# Patient Record
Sex: Male | Born: 1955
Health system: Southern US, Community
[De-identification: ages and names within clinical notes are randomized; demographics above are authoritative.]

## PROBLEM LIST (undated history)

## (undated) DIAGNOSIS — G4733 Obstructive sleep apnea (adult) (pediatric): Secondary | ICD-10-CM

## (undated) DIAGNOSIS — K5730 Diverticulosis of large intestine without perforation or abscess without bleeding: Secondary | ICD-10-CM

## (undated) DIAGNOSIS — C649 Malignant neoplasm of unspecified kidney, except renal pelvis: Secondary | ICD-10-CM

## (undated) DIAGNOSIS — Z992 Dependence on renal dialysis: Secondary | ICD-10-CM

## (undated) DIAGNOSIS — G629 Polyneuropathy, unspecified: Secondary | ICD-10-CM

## (undated) DIAGNOSIS — K635 Polyp of colon: Secondary | ICD-10-CM

## (undated) DIAGNOSIS — N186 End stage renal disease: Secondary | ICD-10-CM

## (undated) DIAGNOSIS — E782 Mixed hyperlipidemia: Secondary | ICD-10-CM

## (undated) DIAGNOSIS — E119 Type 2 diabetes mellitus without complications: Secondary | ICD-10-CM

## (undated) DIAGNOSIS — I4891 Unspecified atrial fibrillation: Secondary | ICD-10-CM

## (undated) DIAGNOSIS — I10 Essential (primary) hypertension: Secondary | ICD-10-CM

## (undated) DIAGNOSIS — D649 Anemia, unspecified: Secondary | ICD-10-CM

## (undated) HISTORY — DX: Polyp of colon: K63.5

## (undated) HISTORY — DX: Obstructive sleep apnea (adult) (pediatric): G47.33

## (undated) HISTORY — DX: Unspecified atrial fibrillation: I48.91

## (undated) HISTORY — DX: End stage renal disease: Z99.2

## (undated) HISTORY — DX: End stage renal disease: N18.6

## (undated) HISTORY — PX: COLONOSCOPY W/ POLYPECTOMY: SHX1380

## (undated) HISTORY — DX: Mixed hyperlipidemia: E78.2

## (undated) HISTORY — DX: Malignant neoplasm of unspecified kidney, except renal pelvis: C64.9

## (undated) HISTORY — PX: OTHER SURGICAL HISTORY: SHX169

## (undated) HISTORY — DX: Diverticulosis of large intestine without perforation or abscess without bleeding: K57.30

## (undated) HISTORY — DX: Type 2 diabetes mellitus without complications: E11.9

## (undated) HISTORY — DX: Essential (primary) hypertension: I10

---

## 2000-07-29 ENCOUNTER — Encounter (HOSPITAL_COMMUNITY): Admission: RE | Admit: 2000-07-29 | Discharge: 2000-08-28 | Payer: Self-pay | Admitting: Oncology

## 2000-07-29 ENCOUNTER — Encounter: Admission: RE | Admit: 2000-07-29 | Discharge: 2000-07-29 | Payer: Self-pay | Admitting: Oncology

## 2001-01-27 ENCOUNTER — Encounter: Admission: RE | Admit: 2001-01-27 | Discharge: 2001-01-27 | Payer: Self-pay | Admitting: Oncology

## 2001-01-27 ENCOUNTER — Encounter (HOSPITAL_COMMUNITY): Admission: RE | Admit: 2001-01-27 | Discharge: 2001-02-26 | Payer: Self-pay | Admitting: Oncology

## 2001-01-30 ENCOUNTER — Encounter (HOSPITAL_COMMUNITY): Payer: Self-pay | Admitting: Oncology

## 2001-07-25 ENCOUNTER — Encounter: Admission: RE | Admit: 2001-07-25 | Discharge: 2001-10-23 | Payer: Self-pay | Admitting: Family Medicine

## 2002-02-25 ENCOUNTER — Emergency Department (HOSPITAL_COMMUNITY): Admission: EM | Admit: 2002-02-25 | Discharge: 2002-02-25 | Payer: Self-pay | Admitting: *Deleted

## 2002-02-25 ENCOUNTER — Encounter: Payer: Self-pay | Admitting: *Deleted

## 2002-12-17 ENCOUNTER — Encounter: Payer: Self-pay | Admitting: Family Medicine

## 2002-12-17 ENCOUNTER — Ambulatory Visit (HOSPITAL_COMMUNITY): Admission: RE | Admit: 2002-12-17 | Discharge: 2002-12-17 | Payer: Self-pay | Admitting: Family Medicine

## 2002-12-19 ENCOUNTER — Encounter: Admission: RE | Admit: 2002-12-19 | Discharge: 2002-12-19 | Payer: Self-pay | Admitting: Oncology

## 2002-12-19 ENCOUNTER — Encounter (HOSPITAL_COMMUNITY): Admission: RE | Admit: 2002-12-19 | Discharge: 2003-01-18 | Payer: Self-pay | Admitting: Oncology

## 2003-10-25 ENCOUNTER — Ambulatory Visit (HOSPITAL_COMMUNITY): Admission: RE | Admit: 2003-10-25 | Discharge: 2003-10-25 | Payer: Self-pay | Admitting: Family Medicine

## 2003-11-04 ENCOUNTER — Encounter: Admission: RE | Admit: 2003-11-04 | Discharge: 2003-11-22 | Payer: Self-pay | Admitting: Oncology

## 2003-11-18 ENCOUNTER — Ambulatory Visit (HOSPITAL_COMMUNITY): Admission: RE | Admit: 2003-11-18 | Discharge: 2003-11-18 | Payer: Self-pay | Admitting: Family Medicine

## 2003-12-17 ENCOUNTER — Encounter: Admission: RE | Admit: 2003-12-17 | Discharge: 2003-12-17 | Payer: Self-pay | Admitting: Oncology

## 2003-12-17 ENCOUNTER — Encounter (HOSPITAL_COMMUNITY): Admission: RE | Admit: 2003-12-17 | Discharge: 2004-01-16 | Payer: Self-pay | Admitting: Oncology

## 2003-12-23 ENCOUNTER — Encounter: Payer: Self-pay | Admitting: *Deleted

## 2003-12-23 ENCOUNTER — Inpatient Hospital Stay (HOSPITAL_COMMUNITY): Admission: EM | Admit: 2003-12-23 | Discharge: 2003-12-27 | Payer: Self-pay | Admitting: Emergency Medicine

## 2004-01-08 ENCOUNTER — Encounter (HOSPITAL_COMMUNITY): Admission: RE | Admit: 2004-01-08 | Discharge: 2004-02-07 | Payer: Self-pay | Admitting: Orthopaedic Surgery

## 2004-02-11 ENCOUNTER — Encounter (HOSPITAL_COMMUNITY): Admission: RE | Admit: 2004-02-11 | Discharge: 2004-03-12 | Payer: Self-pay | Admitting: Orthopaedic Surgery

## 2004-05-21 ENCOUNTER — Ambulatory Visit (HOSPITAL_COMMUNITY): Admission: RE | Admit: 2004-05-21 | Discharge: 2004-05-22 | Payer: Self-pay | Admitting: Orthopaedic Surgery

## 2004-12-16 ENCOUNTER — Ambulatory Visit (HOSPITAL_COMMUNITY): Payer: Self-pay | Admitting: Oncology

## 2004-12-16 ENCOUNTER — Encounter (HOSPITAL_COMMUNITY): Admission: RE | Admit: 2004-12-16 | Discharge: 2005-01-15 | Payer: Self-pay | Admitting: Oncology

## 2004-12-16 ENCOUNTER — Encounter: Admission: RE | Admit: 2004-12-16 | Discharge: 2004-12-16 | Payer: Self-pay | Admitting: Oncology

## 2005-12-13 ENCOUNTER — Ambulatory Visit (HOSPITAL_COMMUNITY): Payer: Self-pay | Admitting: Oncology

## 2005-12-13 ENCOUNTER — Encounter (HOSPITAL_COMMUNITY): Admission: RE | Admit: 2005-12-13 | Discharge: 2006-01-12 | Payer: Self-pay | Admitting: Oncology

## 2005-12-13 ENCOUNTER — Encounter: Admission: RE | Admit: 2005-12-13 | Discharge: 2005-12-13 | Payer: Self-pay | Admitting: Oncology

## 2006-01-26 ENCOUNTER — Encounter: Admission: RE | Admit: 2006-01-26 | Discharge: 2006-01-26 | Payer: Self-pay | Admitting: Urology

## 2006-03-16 ENCOUNTER — Ambulatory Visit (HOSPITAL_COMMUNITY): Admission: RE | Admit: 2006-03-16 | Discharge: 2006-03-16 | Payer: Self-pay | Admitting: Interventional Radiology

## 2006-03-29 ENCOUNTER — Encounter: Admission: RE | Admit: 2006-03-29 | Discharge: 2006-03-29 | Payer: Self-pay | Admitting: Interventional Radiology

## 2006-04-06 ENCOUNTER — Encounter (INDEPENDENT_AMBULATORY_CARE_PROVIDER_SITE_OTHER): Payer: Self-pay | Admitting: Specialist

## 2006-04-06 ENCOUNTER — Ambulatory Visit (HOSPITAL_COMMUNITY): Admission: RE | Admit: 2006-04-06 | Discharge: 2006-04-06 | Payer: Self-pay | Admitting: Interventional Radiology

## 2006-04-21 ENCOUNTER — Encounter: Admission: RE | Admit: 2006-04-21 | Discharge: 2006-04-21 | Payer: Self-pay | Admitting: Interventional Radiology

## 2006-05-11 ENCOUNTER — Ambulatory Visit (HOSPITAL_COMMUNITY): Admission: RE | Admit: 2006-05-11 | Discharge: 2006-05-12 | Payer: Self-pay | Admitting: Interventional Radiology

## 2006-07-05 ENCOUNTER — Encounter: Admission: RE | Admit: 2006-07-05 | Discharge: 2006-07-05 | Payer: Self-pay | Admitting: Interventional Radiology

## 2006-12-12 ENCOUNTER — Encounter (HOSPITAL_COMMUNITY): Admission: RE | Admit: 2006-12-12 | Discharge: 2007-01-11 | Payer: Self-pay | Admitting: Oncology

## 2006-12-12 ENCOUNTER — Ambulatory Visit (HOSPITAL_COMMUNITY): Payer: Self-pay | Admitting: Oncology

## 2006-12-27 ENCOUNTER — Encounter: Admission: RE | Admit: 2006-12-27 | Discharge: 2006-12-27 | Payer: Self-pay | Admitting: Interventional Radiology

## 2007-02-23 HISTORY — PX: ESOPHAGOGASTRODUODENOSCOPY: SHX1529

## 2007-02-23 HISTORY — PX: OTHER SURGICAL HISTORY: SHX169

## 2007-03-15 ENCOUNTER — Inpatient Hospital Stay (HOSPITAL_COMMUNITY): Admission: EM | Admit: 2007-03-15 | Discharge: 2007-03-22 | Payer: Self-pay | Admitting: Emergency Medicine

## 2007-03-16 ENCOUNTER — Ambulatory Visit: Payer: Self-pay | Admitting: Internal Medicine

## 2007-04-20 ENCOUNTER — Ambulatory Visit: Payer: Self-pay | Admitting: Internal Medicine

## 2007-04-28 ENCOUNTER — Ambulatory Visit: Payer: Self-pay | Admitting: Internal Medicine

## 2007-04-28 ENCOUNTER — Encounter: Payer: Self-pay | Admitting: Internal Medicine

## 2007-04-28 ENCOUNTER — Ambulatory Visit (HOSPITAL_COMMUNITY): Admission: RE | Admit: 2007-04-28 | Discharge: 2007-04-28 | Payer: Self-pay | Admitting: Internal Medicine

## 2007-05-01 ENCOUNTER — Encounter: Payer: Self-pay | Admitting: Pulmonary Disease

## 2007-05-29 ENCOUNTER — Ambulatory Visit: Payer: Self-pay | Admitting: Surgery

## 2007-06-08 ENCOUNTER — Ambulatory Visit (HOSPITAL_COMMUNITY): Admission: RE | Admit: 2007-06-08 | Discharge: 2007-06-08 | Payer: Self-pay | Admitting: Surgery

## 2007-06-08 ENCOUNTER — Ambulatory Visit: Payer: Self-pay | Admitting: Vascular Surgery

## 2007-06-12 ENCOUNTER — Encounter (HOSPITAL_COMMUNITY): Admission: RE | Admit: 2007-06-12 | Discharge: 2007-07-12 | Payer: Self-pay | Admitting: Oncology

## 2007-06-12 ENCOUNTER — Ambulatory Visit (HOSPITAL_COMMUNITY): Payer: Self-pay | Admitting: Oncology

## 2007-06-13 ENCOUNTER — Encounter: Payer: Self-pay | Admitting: Pulmonary Disease

## 2007-06-15 ENCOUNTER — Encounter: Payer: Self-pay | Admitting: Pulmonary Disease

## 2007-07-18 ENCOUNTER — Ambulatory Visit: Payer: Self-pay | Admitting: Vascular Surgery

## 2007-07-21 ENCOUNTER — Ambulatory Visit: Payer: Self-pay | Admitting: Surgery

## 2007-08-24 ENCOUNTER — Ambulatory Visit: Payer: Self-pay | Admitting: Pulmonary Disease

## 2007-08-24 DIAGNOSIS — E785 Hyperlipidemia, unspecified: Secondary | ICD-10-CM

## 2007-08-24 DIAGNOSIS — I10 Essential (primary) hypertension: Secondary | ICD-10-CM | POA: Insufficient documentation

## 2007-08-24 DIAGNOSIS — G4733 Obstructive sleep apnea (adult) (pediatric): Secondary | ICD-10-CM

## 2007-08-24 DIAGNOSIS — G4731 Primary central sleep apnea: Secondary | ICD-10-CM

## 2007-09-05 ENCOUNTER — Encounter: Payer: Self-pay | Admitting: Pulmonary Disease

## 2009-10-08 ENCOUNTER — Encounter: Admission: RE | Admit: 2009-10-08 | Discharge: 2009-10-08 | Payer: Self-pay | Admitting: Interventional Radiology

## 2009-10-08 ENCOUNTER — Ambulatory Visit (HOSPITAL_COMMUNITY): Admission: RE | Admit: 2009-10-08 | Discharge: 2009-10-08 | Payer: Self-pay | Admitting: Interventional Radiology

## 2009-10-16 ENCOUNTER — Encounter: Payer: Self-pay | Admitting: Cardiology

## 2010-03-14 ENCOUNTER — Encounter (HOSPITAL_COMMUNITY): Payer: Self-pay | Admitting: Oncology

## 2010-03-15 ENCOUNTER — Encounter (HOSPITAL_COMMUNITY): Payer: Self-pay | Admitting: Oncology

## 2010-03-15 ENCOUNTER — Encounter: Payer: Self-pay | Admitting: Interventional Radiology

## 2010-03-16 ENCOUNTER — Encounter: Payer: Self-pay | Admitting: Interventional Radiology

## 2010-04-16 ENCOUNTER — Encounter (INDEPENDENT_AMBULATORY_CARE_PROVIDER_SITE_OTHER): Payer: Self-pay | Admitting: *Deleted

## 2010-04-21 NOTE — Letter (Signed)
Summary: Recall, Screening Colonoscopy Only  [facility]  [address]   [place], [place] [address]   Phone: [phone]  Fax: [phone]    April 16, 2010  Albert [name] [address] [place], [place]  [address] 12-Mar-1955   Dear Albert [name],   [facility] records indicate it is time to schedule your colonoscopy.   Please call our office at [phone] and ask for the nurse.   Thank you, Burnadette Peter, LPN Waldon Merl, [facility] Ph: [phone]   Fax: [phone]

## 2010-04-22 ENCOUNTER — Telehealth (INDEPENDENT_AMBULATORY_CARE_PROVIDER_SITE_OTHER): Payer: Self-pay | Admitting: *Deleted

## 2010-04-24 ENCOUNTER — Encounter: Payer: Self-pay | Admitting: Internal Medicine

## 2010-04-30 ENCOUNTER — Encounter: Payer: Self-pay | Admitting: Internal Medicine

## 2010-04-30 NOTE — Progress Notes (Signed)
  Phone Note Call from Patient   Caller: Patient Call For: schedule colonoscopy Reason for Call: Talk to Nurse Action Taken: Phone Call Completed Details for Reason: letter sent to pt to schedule colonoscopy, [facility] Summary of Call: this nurse returned pt call and completed screening form. form then forwarded to scheduling personel.  Initial call taken by: Loney Loh

## 2010-05-04 ENCOUNTER — Encounter: Payer: Medicare Other | Admitting: Internal Medicine

## 2010-05-04 ENCOUNTER — Other Ambulatory Visit: Payer: Self-pay | Admitting: Internal Medicine

## 2010-05-04 ENCOUNTER — Ambulatory Visit (HOSPITAL_COMMUNITY)
Admission: RE | Admit: 2010-05-04 | Discharge: 2010-05-04 | Disposition: A | Discharge status: Home / Self Care | Payer: Medicare Other | Source: Ambulatory Visit | Attending: Internal Medicine | Admitting: Internal Medicine

## 2010-05-04 DIAGNOSIS — K5730 Diverticulosis of large intestine without perforation or abscess without bleeding: Secondary | ICD-10-CM

## 2010-05-04 DIAGNOSIS — Z09 Encounter for follow-up examination after completed treatment for conditions other than malignant neoplasm: Secondary | ICD-10-CM | POA: Insufficient documentation

## 2010-05-04 DIAGNOSIS — Z860100 Personal history of colon polyps, unspecified: Secondary | ICD-10-CM | POA: Insufficient documentation

## 2010-05-04 DIAGNOSIS — E785 Hyperlipidemia, unspecified: Secondary | ICD-10-CM | POA: Insufficient documentation

## 2010-05-04 DIAGNOSIS — E119 Type 2 diabetes mellitus without complications: Secondary | ICD-10-CM | POA: Insufficient documentation

## 2010-05-04 DIAGNOSIS — I10 Essential (primary) hypertension: Secondary | ICD-10-CM | POA: Insufficient documentation

## 2010-05-04 DIAGNOSIS — D126 Benign neoplasm of colon, unspecified: Secondary | ICD-10-CM | POA: Insufficient documentation

## 2010-05-04 LAB — GLUCOSE, CAPILLARY: Glucose-Capillary: 81 mg/dL (ref 70–99)

## 2010-05-05 NOTE — Letter (Signed)
Summary: TRIAGE ORDER  TRIAGE ORDER   Imported By: Sofie Rower 04/24/2010 16:42:24  _____________________________________________________________________  External Attachment:    Type:   Image     Comment:   External Document  Appended Document: TRIAGE ORDER need reason for coumadin before ok  Appended Document: [place] and line busy.  Appended Document: TRIAGE ORDER Called pt, Vm full.   Appended Document: TRIAGE ORDER Pt said he is on coumadin for A-Fib. Cardiologist is Dr. Claiborne Billings here in [place] at [place].   Appended Document: TRIAGE ORDER noted; stop coumadin x 4 days prior to procedure  Appended Document: TRIAGE ORDER Per pt he has not taken his Coumadin for today.  I instructed pt to hold his Coumadin starting today until after his procedure on 05/04/10.Marland KitchenMarland Kitchen

## 2010-05-05 NOTE — Letter (Signed)
Summary: [facility] INSTRCTIONS  [facility] INSTRCTIONS   Imported By: Sofie Rower 04/30/2010 12:41:37  _____________________________________________________________________  External Attachment:    Type:   Image     Comment:   External Document

## 2010-05-07 ENCOUNTER — Encounter: Payer: Self-pay | Admitting: Internal Medicine

## 2010-05-10 NOTE — Op Note (Signed)
  NAMEHAMZA, Albert [name]           ACCOUNT NO.:  [id number]  MEDICAL RECORD NO.:  [id number]           PATIENT TYPE:  O  LOCATION:  DAYP                          FACILITY:  [facility]  PHYSICIAN:  R. Garfield Cornea, M.D. DATE OF BIRTH:  01/04/1956  DATE OF PROCEDURE:  05/04/2010 DATE OF DISCHARGE:                              OPERATIVE REPORT   PROCEDURE:  Surveillance colonoscopy.  INDICATIONS FOR PROCEDURE:  A 55 year old gentleman had a large adenoma removed.  Scope 3 years ago.  He is here for surveillance.  He is not having any lower GI tract symptoms.  Risks, benefits, limitations, alternatives, and approach have been reviewed, questions answered.  He has stopped his Coumadin 4 days ago on request.  Please see the documentation medical record.  PROCEDURE NOTE:  O2 saturation, blood pressure, pulse, and respirations were monitored throughout the entirety of procedure.  CONSCIOUS SEDATION:  Versed 4 mg IV and Demerol 75 mg IV in divided doses.  INSTRUMENT:  Pentax video chip system.  FINDINGS:  Digital rectal exam revealed no abnormalities.  Endoscopic findings:  Prep was adequate.  Colon:  Colonic mucosa was surveyed from the rectosigmoid junction through the left transverse right colon to the appendiceal orifice, ileocecal valve/cecum.  These structures were well seen and photographed for the record.  From this level, scope was slowly and cautiously withdrawn.  All previously mentioned mucosal surfaces were again seen.  The patient again had scattered pancolonic diverticula, there was a single diminutive polyp in distal transverse colon which was cold biopsied/removed.  Remaining colonic mucosa appeared normal.  Scope was pulled down the rectum where thorough examination of the rectal mucosa including retroflex view of anal verge demonstrated no abnormalities.  The patient tolerated the procedure well and was reactive in Endoscopy.  Cecal withdrawal time 16  minutes.  IMPRESSION: 1. Normal rectum. 2. Few scattered pancolonic diverticula, single diminutive polyp,     distal transverse colon, status post cold biopsy removal.  RECOMMENDATIONS: 1. Diverticulosis and polyp literature provided to Mr. Spadoni. 2. Follow up on path. 3. Resume Coumadin today. 4. Further recommendations to follow.     Bridgette Habermann, M.D.     RMR/MEDQ  D:  05/04/2010  T:  05/04/2010  Job:  [id number]  cc:   [facility]  Electronically Signed by Jannette Spanner M.D. on 05/10/2010 09:12:23 AM

## 2010-05-12 NOTE — Letter (Addendum)
Summary: Patient Notice, Colon Biopsy Results  [facility]  [address]   [place], [place] [address]   Phone: [phone]  Fax: [phone]       May 07, 2010   Albert [name] [address] [place], [place]  [address] 1955/03/03    Dear Mr. [name],  I am pleased to inform you that the biopsies taken during your recent colonoscopy did not show any evidence of cancer upon pathologic examination.  Additional information/recommendations:  No further action is needed at this time.  Please follow-up with your primary care physician for your other healthcare needs.  You should have a repeat colonoscopy examination  in 5 years.  Please call us if you are having persistent problems or have questions about your condition that have not been fully answered at this time.  Sincerely,    R. Garfield Cornea MD, [facility] Ph: [phone]    Fax: [phone]   Appended Document: Patient Notice, Colon Biopsy Results letter mailed to pt  Appended Document: Patient Notice, Colon Biopsy Results reminder in epic

## 2010-07-07 NOTE — Discharge Summary (Signed)
Albert [name], Albert [name] NO.:  [id number]   MEDICAL RECORD NO.:  [id number]          PATIENT TYPE:  INP   LOCATION:  A207                          FACILITY:  [facility]   PHYSICIAN:  Bonnielee Haff, MD     DATE OF BIRTH:  04-27-1955   DATE OF ADMISSION:  03/15/2007  DATE OF DISCHARGE:  LH                               DISCHARGE SUMMARY   This is an addendum to a discharge summary dictated on March 16, 2007.   DISCHARGE DIAGNOSES:  See previously dictated summary as well.   1. Mild GI bleed.  2. Paroxysmal atrial fibrillation.  3. Chronic renal insufficiency.  4. Congestive heart failure.   Essentially for the last six days, the patient has been awaiting  disposition per Dr. Mathis Bud.  Patient, because of his worsening  systolic EF, was considered to undergo a cardiac cath.  Today Dr.  Mathis Bud told me that the patient has decided not to undergo it, so she  is recommending outpatient followup.  So we will be discharging this  patient home.  Patient does have a history of A fib, for which he will  be started back on Lovenox and Coumadin.  He also has a history of renal  failure, for which Dr. Lowanda Foster was following him.  He will follow him  as an outpatient.  He will be continued on his Lasix.  He was set up for  a colonoscopy as an outpatient; however, because of he was still in the  hospital, that obviously was cancelled.  That will need to be done at  some point in the future.  We will make an appointment for him to see  Dr. [name] in a month's time.  His hemoglobin has remained stable, and  today it is 13.8.  His hemoccult has been negative, so he is not  actively bleeding at this time.  Dr. Mathis Bud feels that patient needs  to be on Lovenox and Coumadin until his INR is therapeutic.  His INR was  2 yesterday, but he has been off Coumadin for a few days now.   PHYSICAL EXAMINATION:  Today, the patient is feeling well.  He does not  have any complaints to offer.   His vital signs are all stable.  He is  saturating at 99% on room air.  He has been ambulating with no  difficulties.  LUNGS:  Clear to auscultation.  EXTREMITIES:  Do not show any edema.   His labs are all pretty stable at this time.  His renal function is  stable with a BUN of 52 and creatinine of 3.4.  Overall, he is  considered stable to be discharged home.   DISCHARGE MEDICATIONS:  1. Coumadin 2.5 mg p.o. daily.  2. Lovenox 110 mg subcu daily, once daily x5 days.  PT/INR to be      checked on Friday, January 30th.  The results to be called to Dr.      Mathis Bud.  3. Lasix 60 mg p.o. daily.  4. Protonix 40 mg p.o. daily.  5. Coreg 6.25 mg p.o. b.i.d.  6. Allopurinol 200 mg p.o.  daily.  7. Cozaar 50 mg p.o. daily.  8. Diltiazem CD 180 mg once daily.  9. Fish oil 3 times a day.  10.Glipizide 10 mg once daily.  11.Tricor 145 mg daily.   FOLLOW UP:  1. Dr. Sydell Axon in one month.  2. Dr. Mathis Bud on February 2.  3. PT/INR on January 30th.  4. Dr. Lowanda Foster in two weeks.   DIET:  He will continue to have a renal diet.   PHYSICAL ACTIVITY:  No restrictions.   He has been told the importance of following up with all of the  physicians mentioned above.   Imaging studies that he underwent here include acute abdominal series,  which did not show any specific findings.   Chest x-ray showed possible minimal right pleural effusion with stable  right basilar atelectasis.   It should be noted that the patient also has a history of recurrent  renal carcinoma, for which he gets outpatient followup.      Bonnielee Haff, MD  Electronically Signed     GK/MEDQ  D:  03/22/2007  T:  03/22/2007  Job:  [id number]   cc:   Leslye Peer, MD  Fax: [phone]   Alison Murray, M.D.  Fax: [phone]   R. Garfield Cornea, M.D.  [address]  [place]  [place] [address]

## 2010-07-07 NOTE — Group Therapy Note (Signed)
NAMEGUSSIE, ODOMS NO.:  [id number]   MEDICAL RECORD NO.:  [id number]          PATIENT TYPE:  INP   LOCATION:  A207                          FACILITY:  [facility]   PHYSICIAN:  Anselmo Pickler, DO    DATE OF BIRTH:  1955-02-23   DATE OF PROCEDURE:  03/21/2007  DATE OF DISCHARGE:                                 PROGRESS NOTE   The patient seen today, resting comfortably in bed.  He was transferred  out of the ICU yesterday.  He denies any current complaints.  He was  seen by Dr. Lowanda Foster, who will continue with his current treatment as  well, and Dr. Mathis Bud talked to him regarding possible cath and he  talked with the family.  Dr. Mathis Bud gave several recommendations.  We  are planning taking him to [facility] for catheterization.  She changed  his Diltiazem to 180.  They restarted the Coreg at 3.125, discontinued  his Coumadin and put him on a heparin drip for AFib.  Also will guaiac  all stools, and will continue to work with Dr. Lowanda Foster to maximize his  kidney function to have this done.   PHYSICAL EXAMINATION:  VITALS:  His vitals for today are as follows,  temperature 97.9, pulse 91, respirations 20, blood pressure 130/71.  GENERAL:  Generally this is a 55 year old African American male who is  well-developed, well-nourished, in no acute distress.  HEART:  Regular rate and rhythm.  LUNGS:  Clear to auscultation bilaterally.  ABDOMEN:  Distended, soft, nontender, no organomegaly noted.  EXTREMITIES:  Positive pulses with +1 pitting edema.  The patient has  TED hose on.   LABORATORY DATA:  His labs for today include a white count of 3.6,  hemoglobin of 14.1.  Hematocrit is 41.7.  Platelet count is 199.  His  INR is 2.0.  His chemistry, sodium is 135, potassium is 3.4, chloride is  96, CO2 is 29, glucose is 99, BUN is 54, and creatinine is 3.38.   ASSESSMENT:  Mr. Paille is a 55 year old African American male who  presented to the [facility] after  having his INR drawn.  It was found to  be supratherapeutic.  Also he had also complained about an increasing  shortness of breath while he was here.  The patient also presented with  an hemoccult stool.  While he was here he was evaluated by Dr. [name] and  Dr. Mathis Bud was consulted on the case because of his increasing  shortness of breath.  Repeat two-dimensional echocardiogram was done and  the patient was found to have a significant changes in his ejection  fraction.  He was normal per Dr. Mathis Bud about a year ago and now today  his ejection fraction is 20 to 25%.  She has an extensive history with  this patient and has talked to him in the past about catheterization and  at this point in time she found that it would be in his best interest to  go ahead and have that done now.  One of the issues that has been  keeping the patient from being discharged to  [facility] is that he has 1  kidney.  He had renal cell carcinoma with a right nephrectomy in May  2000 and now his left kidney had a recurrence in March of 2008 and he  has renal insufficiency.  So he has had problems with his renal function  since he has been admitted.  The plan, from what I understand is that  Dr. Mathis Bud will work with Dr. Lowanda Foster until his kidneys are a point  where they feel that he can tolerate the stresses of being catheterized.  At that point in time Dr. Mathis Bud will then take him to [facility] to  do so.   ASSESSMENT:  1. Congestive heart failure.  Currently the patient is being treated      with diuretics and his medications have been changed.  He has been      placed  back on digoxin as noted above.  2. Bradycardia.  He had several episodes in the intensive care unit,      but the was started on CPAP and this seems to be correcting this.      Will keep on CPAP if and when he is transferred.  3. Renal insufficiency.  He is being followed by Dr. Lowanda Foster.  They      are working, as I said before, with  cardiology to get him to the      point that he can be catheterized.      Anselmo Pickler, DO  Electronically Signed     CB/MEDQ  D:  03/21/2007  T:  03/21/2007  Job:  [id number]

## 2010-07-07 NOTE — Assessment & Plan Note (Signed)
[name], Albert [name]              CHART#:  [id number]   DATE:  04/20/2007                       DOB:  30-Oct-1955   ADDENDUM:  After seeing [name] and dictating my H&P and  reviewing the discharge summary from his recent hospitalization, I  received an addended discharge summary dictation, which indicated Dr.  Mathis Bud felt it important that [name] be bridged with Lovenox.  I called Dr. Mathis Bud and discussed [name] with her.  She  recommends bridging with Lovenox for the procedure, and gave me the  green light to proceed with endoscopic evaluation at this time.  We will  therefore arrange for [name] to be bridged with Lovenox as  appropriate before and after the procedure.       Bridgette Habermann, M.D.  Electronically Signed     RMR/MEDQ  D:  04/20/2007  T:  04/20/2007  Job:  [id number]   cc:   Leslye Peer, MD  [name], M.D.
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
## 2010-07-07 NOTE — Assessment & Plan Note (Signed)
OFFICE VISIT   HIROTO, KEMNA  DOB:  08-05-1955                                       05/29/2007  [id number]   REASON FOR VISIT:  Evaluate for dialysis access.   HISTORY:  This is a 55 year old gentleman with type 2 diabetes and  hypertension with chronic renal insufficiency not yet requiring  dialysis.  He is scheduled to undergo cardiac catheterization, and there  is concern with the use of IV dye, he may require dialysis, given his  borderline kidney function.  For this reason, I have been asked to  perform permanent access.  Patient is right-handed.   REVIEW OF SYSTEMS:  GENERAL:  No fever, no chills.  CARDIAC:  Positive for atrial fibrillation.  PULMONARY:  Negative.  GI:  Negative.  GU:  As above.  VASCULAR:  Negative.  NEURO:  Negative.  ORTHO/SKIN:  Positive for gout.  PSYCH:  Negative.  ENT:  Negative.  HEME:  Negative.   PAST MEDICAL HISTORY:  1. Type 2 diabetes, beginning at age 1, not on insulin.  2. Hypertension.  3. Atrial fibrillation.  4. Gout.   FAMILY HISTORY:  Noncontributory.   SOCIAL HISTORY:  He is single with one child.  He works as a  Architectural technologist.  He does not smoke.  He has a history of  smoking, quit in 2000. He drinks approximately two beers per week.   MEDICATIONS:  1. Allopurinol 200 mg once daily.  2. Coreg 25 mg twice daily.  3. Cozaar 50 mg once daily.  4. Diltiazem 240 daily.  5. Fish oil 1000 mcg t.i.d.  6. Glipizide 10 mg per day.  7. Tricor 145 mg per day.  8. Coumadin 2.5 mg on Tuesdays and Thursdays, 5 mg all other days.   ALLERGIES:  None.   PHYSICAL EXAMINATION:  Vital signs:  Heart rate is 80.  Blood pressure  117/78.  Generally, he is well-appearing in no acute distress.  Cardiovascular:  Irregular.  Lungs:  Clear.  Palpable radial and  brachial pulse.   DIAGNOSTIC STUDIES:  Patient had vein mapping today, which shows  excellent vein above the arm crease.   ASSESSMENT/PLAN:  Chronic kidney disease, not on dialysis, needing long-  term access.  I discussed with the patient performing a left upper arm  fistula.  We discussed the possibility of nonmaturity, the risk of  steal.  Patient understands these, and he is ready to proceed.  I am  going to have him stop his Coumadin on Saturday, April 11th, and plan  for his operation on the 16th.  This will be a left upper arm fistula.   Eldridge Abrahams, MD  Electronically Signed   VWB/MEDQ  D:  05/29/2007  T:  05/30/2007  Job:  [id number]   cc:   Alison Murray, M.D.

## 2010-07-07 NOTE — H&P (Signed)
Albert [name], Albert [name]           ACCOUNT NO.:  [id number]   MEDICAL RECORD NO.:  [id number]          PATIENT TYPE:  INP   LOCATION:  A222                          FACILITY:  [facility]   PHYSICIAN:  Anselmo Pickler, DO    DATE OF BIRTH:  1955-09-25   DATE OF ADMISSION:  03/15/2007  DATE OF DISCHARGE:  LH                              HISTORY & PHYSICAL   PRIMARY CARE PHYSICIAN:  [name], M.D.   Patient is a 55 year old male who presented to the emergency room with  an abnormal lab that was found in Dr. Glo Herring office.  He said that  his INR was elevated, but originally, patient states that he went to see  Dr. Orson Ape because he is monitoring him on his Coumadin.  He was  stating that he has been having increasing shortness of breath over the  last several months and had noticed that it was getting worse.  He was  going to have a sleep study done, and when they did lab work, he was  told to follow up with his primary care physician.  At that point in  time, he was told to be examined by the emergency room.  Patient states  that he originally came in for the shortness of breath and found that he  had a decreased hemoglobin.   PAST MEDICAL HISTORY:  1. Hypertension.  2. Diabetes.  3. Atrial fib.  4. Gout.  5. Kidney cancer.  6. Renal insufficiency.   He has a family history of hypertension.  He has had a nephrectomy in  the past and has had left arm surgery due to trauma.   The patient admits to being a nonsmoker, quit in 2000, but said that he  was a pack-a-day smoker for 20 years prior to that.  He denies any drug  abuse.  He drinks socially.  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   He has no drug allergies to any medications.   CURRENT MEDICATIONS:  1. Allopurinol 200 mg p.o. daily.  2. Coreg 6.25 mg x2 daily.  3. Cozaar 50 mg p.o. daily.  4. Diltiazem/HCL 120 mg p.o. daily.  5. Glipizide 10 mg  p.o. daily.  6. Tricor 145 mg p.o. daily.  7. Warfarin 5 mg p.o. daily.   REVIEW OF SYSTEMS:  Patient is positive for weakness and fatigue.  He  denies any change in his eyes or vision.  Patient denies any changes in  his ears, hearing loss, hoarseness, otorrhea, rhinorrhea, sinus  pressure, or sore throat.  CARDIOVASCULAR:  He denies any chest pain,  palpitations, or bradycardia.  RESPIRATORY:  He is positive for dyspnea.  He denies any cough, wheezing, or tachypnea.  GASTROINTESTINAL:  Denies  any nausea, vomiting, or diarrhea.  GU:  He denies any dysuria, urgency,  or dribbling.  MUSCULOSKELETAL:  Negative for arthralgias, arthritis,  back pain.  SKIN:  Negative for pruritus, rash, or abrasions.  NEURO:  Negative for headache, confusion, or altered mental status.  METABOLIC:  Negative for  excessive thirst and cold intolerance.  HEMATOLOGIC:  He  just found out that he is anemic.   PHYSICAL EXAMINATION:  VITAL SIGNS:  Blood pressure 107/90, pulse rate  89, respirations 20.  He is satting at 100% on room air.  GENERAL:  This is a well-developed and well-nourished African-American  male who is in no acute distress.  Head is normocephalic and atraumatic.  Eyes are EOMI, PERRLA.  Ears:  Turbinates are visualized bilaterally.  Gross auditory acuity intact.  Nose: Turbinates are moist.  Throat:  Patient has positive upper plate  of dentures.  No erythema or exudate noted.  NECK:  Supple.  No lymphadenopathy.  Thyroid is within normal limits.  LUNGS:  Clear to auscultation bilaterally.  HEART:  Regular rate and rhythm.  No murmur, rub or gallop.  ABDOMEN:  Round, soft, nontender, nondistended.  No masses or  hepatosplenomegaly.  EXTREMITIES:  Full range of motion.  No tenderness, edema, or abnormal  appearance.  NEURO:  Cranial nerves II-XII grossly intact.  Motor intact in all  extremities.  SKIN:  Good turgor, good texture.   In the ED, the ED physician did do a rectal exam, which  was reported to  me as being positive.   Patient had an EKG done which shows that he is in atrial fib.   A portable chest x-ray was done while he was in the ED that showed  increasing right atelectasis; however, I will go ahead and repeat this  chest x-ray with a two view to get a better picture due to persistent  shortness of breath.   His white count is within normal limits.  Platelet count is 185,  hemoglobin is 12.7, hematocrit 38.8.  INR is 4.8.  His sodium is 140,  potassium 3.8, chloride 110, carbon dioxide 24, glucose 212, BUN 64,  creatinine 3.96.   ASSESSMENT/PLAN:  1. Persistent shortness of breath:  Will go ahead and repeat the chest      x-ray.  Get a two view.  Also, will have Dr. Mathis Bud consult and      participate regarding his cardiomegaly that was seen on the one      view.  The patient is also on Coreg and wondering if this is part      of a possible congestive heart failure picture.  Will await to see      their further recommendations but would like to get an echo ordered      on the patient as well.  2. Gastrointestinal bleed:  Will go ahead and do serial H&H's to see      if his hemoglobin and hematocrit change while he is here.  This      also could be due to his supratherapeutic INR with the heme      positive rectal exam, but will go ahead and continue to monitor      that.  GI has been consulted on the case.  3. His supratherapeutic INR:  Will hold all anticoagulants at this      point in time until he is therapeutic.  4. Renal insufficiency:  Will contact the patient to see who he uses      as his renal physician and then determine if we can just hydrate      him cautiously and see if his creatinine and BUN come down.  We      will continue to monitor the patient and change therapy as needed.  Anselmo Pickler, DO  Electronically Signed     CB/MEDQ  D:  03/16/2007  T:  03/16/2007  Job:  [id number]

## 2010-07-07 NOTE — Group Therapy Note (Signed)
Albert [name], Albert [name] NO.:  [id number]   MEDICAL RECORD NO.:  [id number]          PATIENT TYPE:  INP   LOCATION:  IC07                          FACILITY:  [facility]   PHYSICIAN:  Anselmo Pickler, DO    DATE OF BIRTH:  [birth year]   DATE OF PROCEDURE:  03/17/2007  DATE OF DISCHARGE:                                 PROGRESS NOTE   Patient seen today.  He is currently in the ICU.  I spoke with Dr.  Mathis Bud today, and discussed with her her current plan.  Based on what  she had found with patient's echocardiogram, she is concerned currently  that his function has changed in over 12 months.  He currently has an EF  of about 20-25%.  She is also thinking that she would like to possibly  do a cardiac cath on Monday with him.   Also, his renal function has continued to deteriorate.  Dr. Lowanda Foster is  on the case with him as well.  Discussed with the patient the  possibility of dialysis.  He said this has been brought up to him  before.   Currently will continue to monitor him.  The plan is to see how his  kidneys improve and then make a plan for possible cath on Monday if that  is still appropriate for him.   PHYSICAL EXAMINATION:  His heart rate is 98.  Blood pressure is 112/84.  Respirations are 26.  Pulse oxing at 97% on 2 liters.  GENERAL:  This is a 55 year old African-American male who is well-  developed and well-nourished in no acute distress.  Patient is pleasant  and answers questions appropriately.  HEART:  Regular rate and rhythm.  S1 and S2.  LUNGS:  Clear to auscultation bilaterally.  ABDOMEN:  Soft, round, nontender, nondistended.  No organomegaly noted.  EXTREMITIES:  Positive pulses.  No edema or ecchymosis noted; however,  patient does have TED hose on.   Current labs are as follows:  His white count is within normal limits  except for a decrease in his hemoglobin at 12.8.  There are no other  labs.   ASSESSMENT:  1. Congestive heart failure.  2.  Atrial fibrillation.  3. Renal failure.   PLAN:  Patient is now in the ICU.  I will continue to monitor and keep  therapies the same.  Will get daily labs and check his INR tomorrow to  see if anything needs to be adjusted.  Will continue to monitor and  change as necessary.      Anselmo Pickler, DO  Electronically Signed     CB/MEDQ  D:  03/17/2007  T:  03/17/2007  Job:  [id number]

## 2010-07-07 NOTE — Op Note (Signed)
NAME:  Albert [name], Albert [name]           ACCOUNT NO.:  [id number]   MEDICAL RECORD NO.:  [id number]          PATIENT TYPE:  AMB   LOCATION:  DAY                           FACILITY:  [facility]   PHYSICIAN:  R. Garfield Cornea, M.D. DATE OF BIRTH:  [birth year]   DATE OF PROCEDURE:  04/28/2007  DATE OF DISCHARGE:                               OPERATIVE REPORT   PROCEDURE PERFORMED:  Esophagogastroduodenoscopy with biopsy followed by  colonoscopy with snare polypectomy.   INDICATIONS FOR PROCEDURE:  55 year old gentleman with multiple medical  problems recently found to be anemic and Hemoccult positive, has had  vague upper abdominal discomfort and reflux symptoms (the latter which  had been totally abolished with a course of Protonix).  He currently has  no GI symptoms whatsoever. We saw him recently in a coagulopathic state  with an INR of 4.7, although at that time he had clinically no gross GI  bleeding, but was hemoccult positive.  He has never had a colonoscopy.  There is no family history of colorectal neoplasia.  EGD and colonoscopy  is now being done.  The patient's Coumadin was stopped four days ago  after a consultation with Dr. Mathis Bud. He has been bridged with  Lovenox, his last dose was yesterday.  This approach, again, was  discussed with the patient including the risks, benefits, alternatives,  and limitations.  His questions were answered and he is agreeable.   PROCEDURE NOTE:  O2 saturation, blood pressure, pulse rate, and  respirations were monitored throughout the entire procedure.  Conscious  sedation with Versed 3 mg IV and Demerol 75 mg IV in divided doses.  Instrument Pentax video chip system.  Cetacaine spray for topical  pharyngeal anesthesia.   ESOPHAGOGASTRODUODENOSCOPY FINDINGS:  Examination of the tubular  esophagus revealed accentuated undulating Z-line and a couple of 3-4 mm  islands of salmon colored epithelium just above the Z-line with a small  amount of  intervening normal pearly gray appearing esophageal mucosa.  Please see photos.  Otherwise, the esophageal mucosa was unremarkable.  The scope easily traversed the EG junction.  The gastric cavity was  empty and insufflated well with air.  Examination of the gastric mucosa  including retroflexion of the proximal stomach esophagogastric junction  demonstrated only a small hiatal hernia.  The pylorus was patent and  easily traversed.  Examination of the bulb and second portion revealed  no abnormalities.   THERAPEUTIC/DIAGNOSTIC MANEUVERS PERFORMED:  A couple of biopsies were  taken of the islands of salmon colored epithelium distal esophagus.  The patient tolerated the procedure well and was prepared for  colonoscopy.   COLONOSCOPIC FINDINGS:  Digital rectal exam revealed no abnormalities.  The prep was good.  The colonic mucosa was surveyed from the  rectosigmoid junction through the left, transverse, right colon, to the  area of the appendiceal orifice, ileocecal valve, and cecum.  These  structures were well seen and photographed for the record.  From this  level, the scope was slowly withdrawn.  All previously mentioned mucosal  surfaces were again seen. The patient had the following abnormalities.   1. Scattered pancolonic diverticula.  2. A 1.25 cm adenomatous appearing polyp just distal to the ileocecal      valve.  Please see photos.  This polyp was removed totally with one      pass with the hot snare cautery and recovered through the scope.      There was no bleeding.  Given ongoing anticoagulation, I elected to      go ahead and place a resolution clip directly on the polypectomy      site.  This was done without difficulty.  Again, there was no      bleeding.  The remainder of the colonic mucosa appeared normal.   The scope was pulled down in the rectum where a thorough examination of  the rectal mucosa including retroflexion in the anal verge demonstrated  no  abnormalities.  The patient tolerated the procedure well and was  reacted in endoscopy.   IMPRESSION:  Esophagogastroduodenoscopy:  Accentuated undulating Z-line  not clinically significant, a couple of islands of salmon colored  epithelium in the distal esophagus suspicious for Barrett's, this would  be somewhat unusual in an African American, but nonetheless, it was  there and biopsies were taken. Otherwise, unremarkable esophagus. Small  hiatal hernia. Otherwise, normal stomach, D1 and D2.   Colonoscopy findings:  Normal rectum, pancolonic diverticula,  pedunculated polyp just distal to the ileocecal valve removed with hot  snare technique and recovered, prophylactic resolution clip applied.  Otherwise, normal colon.   The patient could have easily been hemoccult positive from this polyp in  the setting of a coagulopathic state.  Currently, again, Albert [name]  has no GI symptoms.   RECOMMENDATIONS:  1. Continue Protonix 40 mg orally daily.  2. Follow up on path.  3. Diverticulosis literature provided to Albert [name].  4. I have asked him to resume his Coumadin today.  However, I asked      him not to start bridging back until tomorrow so he is not to      resume Lovenox until tomorrow, April 29, 2007. Further      recommendations to follow.  5. No MRI until hemostasis clip no longer present.      Bridgette Habermann, M.D.  Electronically Signed     RMR/MEDQ  D:  04/28/2007  T:  04/28/2007  Job:  [id number]   cc:   Leslye Peer, MD  Fax: [phone]   [name], M.D.  Fax: [phone]   Gaston Islam. Tressie Stalker, MD  Fax: [phone]

## 2010-07-07 NOTE — Group Therapy Note (Signed)
Albert [name], Albert [name]           ACCOUNT NO.:  [id number]   MEDICAL RECORD NO.:  [id number]          PATIENT TYPE:  INP   LOCATION:  IC07                          FACILITY:  [facility]   PHYSICIAN:  Anselmo Pickler, DO    DATE OF BIRTH:  [birth year]   DATE OF PROCEDURE:  DATE OF DISCHARGE:                                 PROGRESS NOTE   The patient was seen today with CPAP on.  He is comfortable with it, did  not have any episodes of bradycardia.  He is also having very good  output.  Still waiting for Dr. Martie Round recommendations on what to do.   PHYSICAL EXAMINATION:  VITAL SIGNS:  His vitals are as follows:  Heart  rate 100, blood pressure 135/97, respirations 19, and he is pulse oxing  at 100%.  GENERAL:  This is a 55 year old African-American male who is well-  developed, well-nourished in no acute distress.  HEART:  Regular rate and rhythm.  LUNGS:  Clear to auscultation bilaterally.  ABDOMEN:  Distended, soft, nontender.  No organomegaly noted.  EXTREMITIES:  Positive pulses with +1 pitting edema.   LABORATORY DATA:  His white count is 4, hemoglobin is 15.6, hematocrit  is 42, platelet count is 199, and his INR is 1.6.  His chemistries:  Sodium 136, potassium 3.8, chloride 98, CO2 28, glucose 80, BUN 58, and  creatinine 3.68.   ASSESSMENT/PLAN:  1. Congestive heart failure.  The patient is currently being treated      with diuretics.  Will go ahead and await any further      recommendations from Dr. Mathis Bud.  If atrial fibrillation is under      control, he can go on Coumadin therapy; however, he is still      subtherapeutic.  The pharmacy is dosing that.  2. Bradycardia.  He has not had any episodes since starting CPAP, as      his bradycardia occurred at night.  Will continue with CPAP.  3. Renal insufficiency failure.  Is being followed by Dr. Lowanda Foster and      seems stable at this point in time and still just waiting to see      what Dr. Mathis Bud would like to do  regarding his care.      Anselmo Pickler, DO  Electronically Signed     CB/MEDQ  D:  03/21/2007  T:  03/21/2007  Job:  [id number]

## 2010-07-07 NOTE — H&P (Signed)
NAME:  Albert [name], Albert [name]           ACCOUNT NO.:  [id number]   MEDICAL RECORD NO.:  [id number]          PATIENT TYPE:  AMB   LOCATION:  DAY                           FACILITY:  [facility]   PHYSICIAN:  R. Garfield Cornea, M.D. DATE OF BIRTH:  04/13/1955   DATE OF ADMISSION:  DATE OF DISCHARGE:  LH                              HISTORY & PHYSICAL   CHIEF COMPLAINT:  History of anemia, Hemoccult positive stool, anemia,  prior colonoscopy.   HISTORY OF PRESENT ILLNESS:  Albert [name] is a very pleasant 49-year-  old gentleman followed primarily by Dr. Orson Ape and Cardiology team Dr.  Odette Fraction.  Admitted to hospital back in January with anemia and  Hemoccult positive stool, although no gross hematochezia, melena,  hematemesis and is somewhat coagulopathic with INR 4.7 back at that  time.  We saw him in consultation.   He had no gross GI bleeding and was not having too much in the way of  any GI symptoms at that time.  Although today, he does relate vague  upper abdominal discomfort and heartburn symptoms.  He states a recent  course of Protonix has made him feel much better.   No odynophagia or dysphagia.  Again, no obvious GI bleeding since his  hospitalization.  We noted he has never had a colonoscopy nor has he had  his upper GI tract evaluated.  There is no family history of GI  malignancy.  Albert [name] furthermore tells me that he snores loudly  and Dr. Mathis Bud is ordering a sleep study on him prior to further  consideration of more invasive cardiology studies including cardiac  catheterization.  He is back on Coumadin.   PAST MEDICAL HISTORY:  1. Hypertension.  2. Diabetes.  3. Atrial fibrillation.  4. Gout.  5. History of renal cell carcinoma status post right nephrectomy and      segmental treatment of the left kidney for recurrence.  He is said      to have mild chronic kidney disease.  He has been followed by Dr.      Tressie Stalker and Dr. Kathlene Cote as well previously.   CURRENT MEDICATIONS:  1. Allopurinol 200 mg daily.  2. Coreg 6.25 mg two b.i.d.  3. Cozaar 50 mg daily.  4. Diltiazem 240 mg daily.  5. Fish oil 1 gram t.i.d.  6. Lasix 40 mg 1-1/2 tablet daily.  7. Glipizide 10 mg daily.  8. Protonix 40 mg daily.  9. Tricor 145 mg daily.  10.Coumadin 2.5 mg Tuesdays and Thursdays, 5 mg on other days.   ALLERGIES:  NO KNOWN DRUG ALLERGIES   FAMILY HISTORY:  No history of chronic GI or liver illness.  Seven  siblings have history of hypertension and diabetes.   SOCIAL HISTORY:  The patient is single. He works at a [employer].  He quit smoking back in 2000.  Occasionally consumes alcohol, but no  illicit drug use.  Has one healthy daughter.   REVIEW OF SYSTEMS:  He has not had any recent chest pain or dyspnea on  exertion.  He denies weight loss.  His appetite remains good.  PHYSICAL EXAMINATION:  GENERAL:  Very pleasant, well-groomed 55 year old  gentleman, appears entirely comfortable.  VITAL SIGNS:  Weight 245, height 6 feet 2 inches, temperature 98, BP  118/70, pulse 68.  SKIN:  Warm and dry.  HEENT:  Exam no scleral icterus.  Conjunctivae are pink.  CHEST:  Lungs are clear to auscultation.  CARDIAC:  Regular rate and rhythm without murmur, gallop, rub.  ABDOMEN:  Nondistended.  Positive bowel sounds, soft, nontender, small  umbilical hernia.  No obvious mass or organomegaly.  EXTREMITIES: No edema.   IMPRESSION:  Albert [name] is a pleasant 55 year old gentleman with  recent anemia, Hemoccult positive stool.  He has vague upper abdominal  discomfort and reflux symptoms that have been pretty well abolished with  Protonix.  He has never had his lower GI tract evaluated.   I think at this point, we can go ahead and proceed with EGD and  colonoscopy.  I have reviewed this approach with Albert [name].  The  potential risks, benefits, alternatives, limitations have been reviewed.  His questions are  answered and he is agreeable.   Because he is hemoccult positive, and  this is not a screening approach, will go ahead and plan to hold his  Coumadin for 4 days prior to the procedure.  This approach also has been  reviewed along with potential risks, benefits and terms.  Will make  further recommendations in the very near future in dictation.      Bridgette Habermann, M.D.  Electronically Signed     RMR/MEDQ  D:  04/20/2007  T:  12/30/2007  Job:  [id number]   cc:   Gaston Islam. Tressie Stalker, MD  Fax: [phone]   Leslye Peer, MD  Fax: [phone]   [name], M.D.  Fax: [phone]

## 2010-07-07 NOTE — Group Therapy Note (Signed)
Albert [name]           ACCOUNT NO.:  [id number]   MEDICAL RECORD NO.:  [id number]          PATIENT TYPE:  INP   LOCATION:  IC07                          FACILITY:  [facility]   PHYSICIAN:  Anselmo Pickler, DO    DATE OF BIRTH:  [birth year]   DATE OF PROCEDURE:  DATE OF DISCHARGE:                                 PROGRESS NOTE   HISTORY OF PRESENT ILLNESS:  The patient seemed to be resting  comfortably in bed, had used CPAP, did not have the bradycardia episodes  that he had the night before.  Actually stated that he felt well rested  for the past several nights.  Will go ahead and continue him on CPAP  nightly.  Basically, also a lot of his blood pressure medications were  discontinued due to his bradycardic episodes, so I think that  combination with the CPAP has helped him.  Dr. Lowanda Foster has been  following him all weekend regarding his kidney function.  He is  currently on half normal saline, 125 cc an hour.  He is on Lasix 60 mg  p.o. b.i.d.  He is also giving good urine output.  Dr. Mathis Bud is to  follow up with him this morning, and based on what she sees, will make a  plan as to the best course of action for Albert [name] at this time.   PHYSICAL EXAMINATION:  VITAL SIGNS:  Blood pressure 132/88, pulse 97,  respirations 23, pulse oximetry 92% on 2 liters.  GENERAL:  This is a well-developed, well-nourished African American male  in no acute distress.  HEART:  Regular rate and rhythm.  LUNGS:  Clear to auscultation bilaterally.  ABDOMEN:  Distended, soft, nontender, no organomegaly noted.  EXTREMITIES:  Positive pulse.  No ecchymosis or cyanosis.  The patient  had TED hose on.  He does have +1 pitting edema today.   LABORATORY DATA:  CBC:  White count is 4.0, hemoglobin 2.3, hematocrit  42.0, platelets 199.  INR is 1.6.  Sodium is 136 which is corrected from  yesterday.  Potassium 3.8, chloride 98, PO2 28, glucose 80.  BUN 58,  creatinine 3.68.   ASSESSMENT/PLAN:  1. Congestive heart failure.  There is a concern for being right      sided, await any further recommendations from Dr. Mathis Bud.  2. His atrial fibrillation seems to be under control.  He is on      Coumadin therapy, but it is still subtherapeutic.  3. Bradycardia.  He had his medications discontinued that would affect      this heart rate as well as CPAP started which seemed to improve.      His renal failure is being followed by Dr. Lowanda Foster.  It seems to      be stable at this      point in time.  Await any further recommendations from him.      Currently, we are awaiting recommendations from Cardiology.  Based      on that, we will determine if the patient needs to be transferred      or if he will continue with  care at [facility] [place], DO  Electronically Signed     CB/MEDQ  D:  03/20/2007  T:  03/20/2007  Job:  [id number]

## 2010-07-07 NOTE — Consult Note (Signed)
Albert [name], Albert [name]           ACCOUNT NO.:  [id number]   MEDICAL RECORD NO.:  [id number]          PATIENT TYPE:  INP   LOCATION:  A222                          FACILITY:  [facility]   PHYSICIAN:  R. Garfield Cornea, M.D. DATE OF BIRTH:  01-10-1956   DATE OF CONSULTATION:  DATE OF DISCHARGE:                                 CONSULTATION   PRIMARY CARE PHYSICIAN:  Elsie Lincoln, M.D.   CARDIOLOGIST:  Leslye Peer, MD   REASON FOR CONSULTATION:  Coagulopathy/hemoccult positive stool/anemia.   HISTORY OF PRESENT ILLNESS:  Albert [name] is a 55 year old male who is  a patient of Dr. Martie Round, on Coumadin for atrial fibrillation.  He  went to have his INR drawn.  He was told it was too high.  He was then  sent to Dr. Perley Jain office for further evaluation of shortness of breath  and dyspnea he has been having for a couple of weeks now.  He was found  to be anemic and sent to the hospital for admission.  He was also found  to have hemoccult positive stool.  Today his hemoglobin is 12.4,  hematocrit 37.2, and MCV of 82.4.  He had an INR of 4.7, creatinine of  3.63.  He denies any rectal bleeding, has noted darker stools than  usual.  He did have an episode of abdominal bloating and some complex  abdominal pain with nausea 2 nights ago.  He did vomit once.  He denies  any hematemesis.  He generally has 1-2 bowel movements a day.  He denies  any constipation or diarrhea.  He denies any anorexia or early satiety.  He denies any heartburn or indigestion.  His weight has remained stable.  He is taking Allopurinol at home for gout.  He has never had a  colonoscopy.   PAST MEDICAL AND SURGICAL HISTORY:  Hypertension, diabetes mellitus,  atrial fibrillation on Coumadin since July 2008.  He has history of  gout.  Renal carcinoma status post right nephrectomy in May 2000.  He  has chronic renal insufficiency.  He had a recurrence in the left kidney  in March of 2008.  He was seen by Dr. Tressie Stalker  and Dr. Kathlene Cote for  treatment.  He tells me he is in a remission currently.   MEDICATIONS PRIOR TO ADMISSION:  1. Allopurinol 20 mg daily.  2. Coreg 6.25 mg b.i.d.  3. Cozaar 50 mg daily.  4. Diltiazem HCl 120 mg daily.  5. Fish oil 1 g t.i.d.  6. Glipizide 10 mg daily.  7. Tricor 145 mg daily.  8. Coumadin 5 mg daily.   ALLERGIES:  NO KNOWN DRUG ALLERGIES.   FAMILY HISTORY:  There is no family history of colon carcinoma or other  chronic GI problems.  Mother, age 31, has history of hypertension, gout,  and thyroid disease.  Father deceased in his 38s secondary to an  accident at work.  He has 7 siblings total with history significant for  diabetes mellitus and hypertension.   SOCIAL HISTORY:  Albert [name] is single.  He works in a [employer].  He quit smoking in  2000.  He has a 20 pack year history of  tobacco use.  Occasionally consumes alcohol and denies any drug use.  He  has 1 healthy daughter.   REVIEW OF SYSTEMS:  See HPI, otherwise negative.   PHYSICAL EXAMINATION:  VITAL SIGNS:  Weight 118.3 kg.  Height 74 inches.  Temp 97.8.  Pulse 85.  Respirations 20.  Blood pressure 145/89.  O2 sat  96% on room air.  GENERAL:  Generally he is a well-developed, well-nourished African  American male in no acute distress.  HEENT:  Pupils reactive, conjunctivae pink.  Oropharynx pink and moist  without any lesion.  NECK:  Supple, without mass or thyromegaly.  CHEST:  Heart rate if irregular without murmurs, clicks, rubs, or  gallops.  LUNGS:  Clear to auscultation bilaterally.  ABDOMEN:  Positive bowel sounds x4.  No bruits auscultated.  He does  have an easily reducible small, umbilical hernia that is nontender.  He  has well-healed right upper quadrant scar from previous surgery.  Abdomen is otherwise soft, slightly distended without palpable mass or  hepatosplenomegaly.  No rebound tenderness or guarding.  EXTREMITIES:  Without clubbing or edema bilaterally.  SKIN:   Warm and dry, without any rash or jaundice.   LABS:  WBC 4, platelet count 180, and retic percent 2.2.  RBC 4.51 with  absolute reticulocytes 99.2.  PTT of 73, sodium 145, potassium 4.6,  chloride 112.  CO2 24, glucose 104, BUN 60, creatinine 3.63.  Total  bilirubin 0.6, direct 0.2, indirect 0.4.  Alkaline phosphatase 32, AST  24, ALT 31, total protein 6.9, albumin 3.3, and calcium 9.3.  BNP is  446.  Urinalysis was positive for trace blood and 30 mg/dL of protein.   IMPRESSION:  Albert [name] is a 55 year old male with coagulopathy with  an INR of 4.7 and mild, normocytic anemia with hemoglobin of 12.4.  He  has a history of chronic atrial fibrillation on Coumadin therapy.  He  also has history of renal insufficiency with a creatinine of 3.63,  status post right nephrectomy with left recurrence status post treatment  by Dr. Tressie Stalker last year.  There is no gross gastrointestinal bleeding  at this time.  He has never had a colonoscopy.  Given his coagulopathy,  he is at risk for gastrointestinal bleeding including upper  gastrointestinal source such as peptic ulcer disease, small bowel AVM's  or colonic source.  At this time his hemoglobin is stable, but would be  concerned about acute drop with upper gastrointestinal bleed.   Questionable etiology of shortness of breath, as his anemia is not  significant at this time.  Unless he shows rapid drop would be concerned  about other etiologies including cardiopulmonary issues.   PLAN:  1. Colonoscopy plus/minus EGD once coagulopathy is corrected.  2. PPI daily.  3. Vitamin K 5 mg p.o. now.  4. Check INR and CBC in the morning.  5. Clear liquids for now.   I want to thank you, [facility], for allowing [place] to participate in  the care of  Albert [name].      Albert [name], N.P.      Bridgette Habermann, M.D.  Electronically Signed    KJ/MEDQ  D:  03/16/2007  T:  03/16/2007  Job:  [id number]   cc:   Jeryl Columbia, M.D.  Fax:  [phone]   Leslye Peer, MD  Fax: [phone]

## 2010-07-07 NOTE — Group Therapy Note (Signed)
NAMEBURTON, Albert NO.:  [id number]   MEDICAL RECORD NO.:  [id number]          PATIENT TYPE:  INP   LOCATION:  IC07                          FACILITY:  [facility]   PHYSICIAN:  Anselmo Pickler, DO    DATE OF BIRTH:  Aug 25, 1955   DATE OF PROCEDURE:  03/18/2007  DATE OF DISCHARGE:                                 PROGRESS NOTE   This is his ICU day #2.  The patient is seen today, said he had a good  night.  He did not have any  episodes of dizziness, clammy, diaphoresis,  or elevated heart rate just like he did the night before.  Still stating  that his abdomen feels like it is more distended than it has been.  We  will go ahead and get an abdominal series on him to determine if this is  ascites or not.  His vitals for today are as follows:  Heart rate 105,  blood pressure 138/87, respirations 19, pulse oxygen being at 97%.  Generally this is a 55 year old African-American male who is well-  developed, well-nourished and in no acute distress.  Heart is regular  rate and rhythm.  Lungs are clear to auscultation bilaterally.  No  wheezes, rales, or rhonchi.  Diminished breath sounds, however,  bilaterally.  Abdomen is round, possible fluid wave heard, but we will  get a chest x-ray to get clinical correlation.  Extremities positive  pulses.  The patient has TED hose on currently, no edema noted.   His labs for today are as follows:  His white count is 6.7, hemoglobin  12.8, hematocrit 39.7, platelet count 210.  His INR is 2.4.  His  chemistry 138.  His sodium/potassium is 3.8, chloride 110, CO2 21,  glucose 61, BUN 54, creatinine 4.08.  His ALT is 57.  His AST is 64.   ASSESSMENT AND PLAN:  1. Congestive heart failure, suspicious of it being right-sided.  We      will go ahead and get an acute abdominal series.  2. Atrial fibrillation.  This seems to be controlled currently.  3. Renal failure.  Apparently the patient has 1 kidney and we are      monitoring his renal  function.  Dr. Lowanda Foster is on the case.  We      will continue to monitor the patient and see how he progresses for      tomorrow.  If he is still as stable as he is, may consider      transferring him out of the ICU, but we will go ahead and see how      he progresses in the meantime.      Anselmo Pickler, DO  Electronically Signed     CB/MEDQ  D:  03/18/2007  T:  03/18/2007  Job:  [id number]

## 2010-07-07 NOTE — Group Therapy Note (Signed)
NAMEDONYAE, TRIPP NO.:  [id number]   MEDICAL RECORD NO.:  [id number]          PATIENT TYPE:  INP   LOCATION:  IC07                          FACILITY:  [facility]   PHYSICIAN:  Anselmo Pickler, DO    DATE OF BIRTH:  1955-08-08   DATE OF PROCEDURE:  DATE OF DISCHARGE:                                 PROGRESS NOTE   The patient seen today sitting comfortably in chair.  Discussed with  nurse last night he had two episodes of bradycardia.  An [facility] doctor  was also contacted where they discontinued his Lopressor.  These  episodes only occurred while the patient was asleep.  Discussed with the  patient his sleep apnea study.  He did not have the study done.  Will go  ahead and start CPAP while he is here to see if that improves his  episodes, but currently the patient having abnormal rhythm just with  movement as well, but during these times he is asymptomatic.  His ICU  day today is #3.  He is continuing to perfuse his kidneys.  From  yesterday he has had an output of over 1400 mL with one kidney.   VITAL SIGNS:  Heart rate 99, blood pressure 126/90 and respirations 16.  GENERAL:  The patient is well-developed, well-nourished, in no acute  distress.  HEART:  Regular rate and rhythm.  LUNGS:  Clear to auscultation bilaterally.  ABDOMEN:  Distended, soft, nontender, and no organomegaly  noted.  EXTREMITIES:  Positive pulses.  No ecchymosis, edema or cyanosis.   His labs for today include an INR of 1.7.  Sodium is 133, potassium 3.7,  chloride 100, CO2 23, glucose 105, BUN 60, creatinine 3.98, calcium  level is 8.6.  His acute abdominal series was negative for any ascites.  Also, right stable basilar atelectasis, possible right pleural effusion,  stable mild bronchitic changes.   ASSESSMENT AND PLAN:  1. Congestive heart failure suspicious of being right-sided.  Will      continue the patient on current therapy.  Dr. Mathis Bud will see the      patient tomorrow and  decide what other evaluation she may want to      do.  His atrial fibrillation seems to be controlled currently.  He      is not therapeutic on his INR but will have pharmacy to monitor his      Coumadin.  2. Bradycardia.  The patient having bradycardia at night.  He has been      stopped on his Lopressor to see if that helps.  Also, there is some      concern about him having sleep apnea.  He failed a sleep study a      couple of weeks ago.  Will go ahead and start him on CPAP and have      respiratory to manage that.  3. Renal failure currently is being followed by Dr. Lowanda Foster.  Based      on his recommendations I think will determine where the patient      goes on Monday.  Will continue to monitor the  patient at this point      in time.     Anselmo Pickler, DO  Electronically Signed    CB/MEDQ  D:  03/19/2007  T:  03/19/2007  Job:  [id number]

## 2010-07-07 NOTE — Discharge Summary (Signed)
[name], [name]           ACCOUNT NO.:  [id number]   MEDICAL RECORD NO.:  [id number]          PATIENT TYPE:  INP   LOCATION:  A222                          FACILITY:  [facility]   PHYSICIAN:  Anselmo Pickler, DO    DATE OF BIRTH:  Feb 27, 1955   DATE OF ADMISSION:  03/15/2007  DATE OF DISCHARGE:  LH                               DISCHARGE SUMMARY   ADMISSION DIAGNOSES:  1. Gastrointestinal bleed.  2. Persistent shortness of breath.  3. Super therapeutic INR.   DISCHARGE DIAGNOSES:  1. Persistent shortness of breath.  2. Renal insufficiency.  3. Elevated BNP.   PRIMARY CARE PHYSICIAN:  [name], M.D.   CONSULTATIONS:  1. R. Garfield Cornea, M.D.  2. Leslye Peer, M.D.  3. Alison Murray, M.D.   HISTORY AND PHYSICAL:  Done by Dr. Anselmo Pickler.  The patient was  admitted based on his positive hemoccult test as well as his super  therapeutic INR.  He was admitted.  Cardiology was consulted.  GI was  consulted to see the patient.  Also a repeat chest x-ray was done and  Dr. Lowanda Foster of nephrology as well was consulted.  They all saw the  patient.  Basically, gastroenterology recommended today that the patient  could have an outpatient colonoscopy.  Dr. Mathis Bud recommended that we  add Lopressor to his regimen.  She has recommended testing for tomorrow  and Dr. Lowanda Foster agreed with the current treatment.  Based on these  findings, the patient could be discharged tomorrow as long as testing  Dr. Mathis Bud has ordered is completed, he can then follow up with her  outpatient.   DISPOSITION:  Home.   He will have specific instructions for his medications.  He will be  placed back on:  1. Allopurinol 200 mg p.o. daily.  2. Coreg 6.25 mg two times a day.  3. Cozaar  50 mg daily.  4. Diltiazem HCl 125 mg daily.  5. Fish oil 1,000 mg three times a day.  6. Glipizide 10 mg daily.  7. TriCor 145 mg daily.  8. Warfarin 5 mg.  9. He also will be placed on Protonix 40  mg p.o. daily.  10.Lopressor 25 mg p.o. daily.   There is some concern whether or not the patient has congestive heart  failure at this point in time, because of his renal failure he is not  eligible for any kind of ARB or ACE.   The instructions he will be sent home on, include:  1. We will set him up with an appointment with Dr. [name] on Monday for      outpatient colonoscopy.  2. Dr. Luan Pulling for his persistent shortness of breath and possible PFT      testing.  3. Dr. Mathis Bud at [facility] will follow up on any and      above  testing.  4. Dr. Lowanda Foster in 1-2 weeks for his renal failure.  5. He also has specific instructions to follow up with all doctors as      recommended and to avoid all NSAIDs x30 days and avoid all  anticoagulants x7 days or until seen by cardiology.   CONDITION:  Stable.   As long as nothing changes, the patient can be discharged to home.  The  current doctor on will make the determination and do an addendum if need  be.      Anselmo Pickler, DO  Electronically Signed     CB/MEDQ  D:  03/16/2007  T:  03/16/2007  Job:  [id number]

## 2010-07-07 NOTE — Group Therapy Note (Signed)
NAMEBOHDI, BLINDER           ACCOUNT NO.:  [id number]   MEDICAL RECORD NO.:  [id number]          PATIENT TYPE:  INP   LOCATION:  A222                          FACILITY:  [facility]   PHYSICIAN:  Leslye Peer, MD       DATE OF BIRTH:  [birth year]   DATE OF PROCEDURE:  DATE OF DISCHARGE:                                 PROGRESS NOTE   REFERRING PHYSICIAN:  Dr. Sherryle Lis and Dr. Mathis Bud, Dr. Cristela Felt and  Dr. [name].   PRIMARY CARE PHYSICIAN:  Dr. Orson Ape.   I wrote a full progress note earlier today on Ms. Bovard.  He had an  echocardiogram with the preliminary revealing severe biventricular  failure.  Full echo report has been dictated and is to follow.  Given  his marked advanced chronic renal insufficiency along with his severe  biventricular failure I had a discussion with Dr. Sherryle Lis and we agreed  to cancel the discharge which was tentatively planned for today.  I also  spoke with Dr. Cristela Felt regarding his severe biventricular failure.  Dr.  Cristela Felt wished to continue the IV fluids to try and improve the  creatinine back to his baseline which according to Dr. Cristela Felt was  approximately 2.5.  If indeed he required a catheterization it was felt  that this would be beneficial in gestation of upcoming contrast load.  If it was not beneficial and he went into congestive heart failure then  Dr. Lowanda Foster plans to dialyze him.  I am in agreement with this plan.  Given Mr. Houseman's very clear history on a very gradual progression  of shortness of breath and dyspnea on exertion, I believe he either has  a nonischemic cardiomyopathy although generally it does not present like  this or he has ischemic cardiomyopathy with gradual collateralization  which is no longer able to support him.  He does have multiple cardiac  risk factors.  Therefore, I will restart his IV fluids at 100 cc's per  hour and transfer him to the unit given his tenuous status with his  severe right and left  ventricular dysfunction.  He did have some mild  abdominal distention and his liver function tests yesterday were normal.  Having said that, he does continue to remain anticoagulated with INR of  4 despite previous vitamin K administration.  This is a bit worrisome  for passive congestion of his liver from right heart failure.  He does  not have significant lower extremity edema at present.  I will check a  CMP in the morning and I will check another chest x-ray today (PA and  lateral).  He has had PAF with RVR during this admission but he has also  had PAF with slight ventricular response.  He is currently in sinus  rhythm.  He does have near anterolateral T wave inversion worrisome for  ischemia.  Having said that his enzymes are essentially negative.  Decision regarding catheterization will be postponed until we see how he  responds from a kidney standpoint.  Dr. Lowanda Foster will round tomorrow on  him in this regard.  I have had a discussion with  Mr. Wassenberg who is  aware of this.   With regards to his scheduled colonoscopy on Monday:  I believe that  will likely postpone this.  His hemoglobin has remained reasonably  stable throughout the hospitalization.  Indeed his hematocrit today is  40.  It may be in his best interest to put that until we have fully  addressed his renal issues and his cardiac issues.  I have talked to the  GI PA regarding this as well.           ______________________________  Leslye Peer, MD     AB/MEDQ  D:  03/17/2007  T:  03/17/2007  Job:  [id number]   cc:   [name], M.D.  Fax: [phone]   Leslye Peer, MD  [phone]   Alison Murray, M.D.  [phone]   Dr. Sherryle Lis

## 2010-07-07 NOTE — Assessment & Plan Note (Signed)
OFFICE VISIT   MANPREET, MIZZELL  DOB:  06-18-1955                                       07/18/2007  [id number]   I saw the patient in the office today for followup after his left upper  arm fistula was placed on 06/08/2007.  He comes in for routine followup  visit.  He has no specific complaints.   EXAMINATION:  The incision in the left arm has healed nicely.  Has an  excellent thrill in his fistula which appears to be maturing nicely.  Overall I am pleased with progress and this appears to be maturing  adequately.  Hopefully, he will be ready to use it if and when he needs  dialysis.   Judeth Cornfield. [name], M.D.  Electronically Signed   CSD/MEDQ  D:  07/18/2007  T:  07/19/2007  Job:  [id number]

## 2010-07-07 NOTE — Procedures (Signed)
CEPHALIC VEIN MAPPING   INDICATION:  Evaluation for dialysis graft access.  Right arm dominant.   HISTORY:  ESRD.   EXAM:   The right cephalic vein is not evaluated.   Diameter measurements range from   The left cephalic vein is compressible.   Diameter measurements range from 0.20 cm to 0.61 cm.  See attached  worksheet for all measurements.   See attached worksheet for all measurements.   IMPRESSION:  1. Patent left cephalic vein which is of acceptable diameter for use      as a dialysis access site from the level of the proximal-mid      forearm to the proximal arm.  2. Some intimal thickening noted.   ___________________________________________  V. Leia Alf, MD   PB/MEDQ  D:  05/29/2007  T:  05/29/2007  Job:  [id number]

## 2010-07-07 NOTE — Op Note (Signed)
NAMEGABEL, RAMIERZ           ACCOUNT NO.:  [id number]   MEDICAL RECORD NO.:  [id number]          PATIENT TYPE:  AMB   LOCATION:  SDS                          FACILITY:  [facility]   PHYSICIAN:  Judeth Cornfield. [name], M.D.DATE OF BIRTH:  09-13-1955   DATE OF PROCEDURE:  06/08/2007  DATE OF DISCHARGE:  06/08/2007                               OPERATIVE REPORT   PREOPERATIVE DIAGNOSIS:  End-stage renal disease.   POSTOPERATIVE DIAGNOSIS:  End-stage renal disease.   PROCEDURE:  Placement of new left upper arm arteriovenous fistula.   SURGEON:  Deitra Mayo, MD.   ANESTHESIA:  Local with sedation.   TECHNIQUE:  The patient was taken to the operating room and sedated by  anesthesia.  The left upper extremity was prepped and draped in the  usual sterile fashion.  After the skin was infiltrated with 1%  lidocaine, a transverse incision was made just above the antecubital  level where the brachial artery was dissected free and had a good pulse  and had no significant plaque.  The cephalic vein was dissected free and  mobilized fairly far distally in order to allow enough length to reach  the brachial artery.  I ligated 2 branches of the vein and then opened  between them to create a widely spatulated anastomosis to the brachial  artery.  The vein irrigated up nicely with heparinized saline.  The  patient was heparinized.  The brachial artery was clamped proximally and  distally and longitudinal arteriotomy was made.  The vein was mobilized  over and sewn end-to-side to the artery using continuous 6-0 Prolene  suture.  At the completion, there was an excellent thrill in the fistula  and a palpable radial pulse.  Hemostasis was obtained in the wound.  The  wound was closed in deep layer of 3-0 Vicryl, the skin closed with 4-0  Vicryl.  A sterile dressing was applied.  The patient tolerated the  procedure well, was transferred to recovery room in satisfactory  condition.  All  needle and sponge counts were correct.      Judeth Cornfield. [name], M.D.  Electronically Signed     CSD/MEDQ  D:  06/08/2007  T:  06/09/2007  Job:  [id number]

## 2010-07-07 NOTE — Group Therapy Note (Signed)
[name], SWARTLEY NO.:  [id number]   MEDICAL RECORD NO.:  [id number]          PATIENT TYPE:  INP   LOCATION:  IC07                          FACILITY:  [facility]   PHYSICIAN:  Salem Caster, DO    DATE OF BIRTH:  08-20-1955   DATE OF PROCEDURE:  03/18/2007  DATE OF DISCHARGE:                                 PROGRESS NOTE   SUBJECTIVE:  The patient is seen today in the ICU.  The patient is  sitting in his chair, no acute distress.  The patient states his  breathing has improved, and he is much better.  The patient denies any  chest pain,   CANCELED DICTATION.      Salem Caster, DO  Electronically Signed     SM/MEDQ  D:  03/18/2007  T:  03/18/2007  Job:  [id number]

## 2010-07-07 NOTE — Procedures (Signed)
NAME:  Albert [name], Albert [name] NO.:  [id number]   MEDICAL RECORD NO.:  [id number]          PATIENT TYPE:  INP   LOCATION:  A222                          FACILITY:  [facility]   PHYSICIAN:  Leslye Peer, MD       DATE OF BIRTH:  Jul 25, 1955   DATE OF PROCEDURE:  DATE OF DISCHARGE:                                ECHOCARDIOGRAM   INDICATIONS:  A 55 year old gentleman with past medical history of GI  bleed who was referred for evaluation of LV function due to shortness of  breath.   Technical quality of this study is adequate.   The aorta measures normally at 3.8 cm.  The left atrium is moderately  dilated.  The patient appeared to be in rate-controlled atrial  fibrillation during the procedure.   The interventricular septum and posterior wall were thickened in a  concentric pattern.   The aortic valve was trileaflet and mildly thickened but without  limitation of leaflet excursion.  Opening may have been slightly  decreased likely due to diminished cardiac output.  Trivial aortic  insufficiency was appreciated as a central jet.  Doppler interrogation  aortic valve was within normal limits.   The mitral valve appeared mildly thickened particularly on the anterior  leaflet of the mitral valve.  Again, there does not appear to be  significant restriction to leaflet excursion.  However, the mitral valve  opening is somewhat diminished likely secondary to diminished cardiac  output.  There is a small mobile echodensity just below the mitral valve  in the left ventricle which likely represents torn minor subvalvular  apparatus or lax chordae.  It does not appear suspicious for vegetation,  although I certainly cannot entirely exclude that possibility.  Mild  mitral regurgitation is noted.  Doppler interrogation of mitral valve is  within normal limits.   The pulmonic valve appeared grossly structurally normal with mild  pulmonic insufficiency noted.   The tricuspid valve  appeared grossly structurally normal with moderate  tricuspid regurgitation.  He did appear to have mild to moderate  pulmonary hypertension by evaluation of the TR jet.   The left ventricle was normal in size.  With the LV IDD measured at 4.61  cm and the LV IC measured at 4.42 cm.  Overall, left ventricular  systolic function is severely diminished with an estimated ejection  fraction of 20-25%.  The margin of error with estimated and the ejection  fraction is a bit higher given the underlying atrial fibrillation.  (I.e. this EF may be slightly better or slightly worse).  There is  severe global hypokinesis.  In most views, the apex appears to be  akinetic, although in one view, it appeared to be some thickening in  this region.  I do not see an obvious thrombus on this study.  However I  certainly cannot exclude the possibility given his severe left  ventricular dysfunction which is new from prior echo.  The presence of  diastolic dysfunction is inferred from pulse wave Doppler across the  mitral valve.   The right atrium is mildly dilated but the right ventricle is moderately  dilated with severe decrease in right ventricular systolic function.   The inner atrial septum is aneurysmal without definitive color flow  noted across the septum.  There is a tiny posterior pericardial effusion  without evidence of hemodynamic compromise.  There is no RA inversion or  RV collapse.  The IVC is dilated with a virtually absent respirophasic  affect suggesting elevated right-sided pressures.   IMPRESSION:  1. Moderate left atrial enlargement.  2. The patient appeared to be in rate-controlled atrial fibrillation      during the procedure.  3. Mild to moderate centric LVH.  4. Mild aortic sclerosis without stenosis and trivial aortic      insufficiency.  5. Mild thickening of the mitral valve with mild mitral regurgitation      but no stenosis.  6. Mitral and aortic valve openings are mildly  diminished likely      secondary to diminished cardiac output.  7. Mild pulmonic insufficiency.  8. Moderate pulmonary moderate tricuspid regurgitation.  9. Mild to moderate pulmonary hypertension.  10.Normal left ventricular size with severe decrease in ejection      fraction estimated at 20-25% but it could be slightly better or      slightly worse than this given the underlying atrial fibrillation.  11.Severe global hypokinesis although in most views the apex appeared      to be a akinetic.  12.No obvious thrombus is appreciated but the possibility cannot be      excluded on this study.  13.Moderate right ventricular enlargement with severe decrease in      right ventricular systolic function.  14.Mild right atrial enlargement.  15.Tiny mobile echodensity just below the mitral valve in the left      ventricle which likely represents a lax chordae or torn minor      subvalvular apparatus.  Appearance does not suggest vegetation but      the possibility cannot be entirely excluded.  16.Dilated IVC with virtually absent respirophasic affect suggestive      of elevated right-sided pressures.  17.Inner atrial septal aneurysm without obvious color flow is noted      across the inner atrial septum.           ______________________________  Leslye Peer, MD     AB/MEDQ  D:  03/17/2007  T:  03/17/2007  Job:  [id number]   cc:   [name], M.D.  Fax: [phone]
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
## 2010-07-10 NOTE — Op Note (Signed)
NAMEDACK, RIVETT NO.:  [id number]   MEDICAL RECORD NO.:  [id number]          PATIENT TYPE:  INP   LOCATION:  5729                         FACILITY:  [facility]   PHYSICIAN:  Vonna Kotyk. Whitfield, M.D.DATE OF BIRTH:  [birth year]   DATE OF PROCEDURE:  12/24/2003  DATE OF DISCHARGE:                                 OPERATIVE REPORT   PREOPERATIVE DIAGNOSIS:  Displaced both bones fracture, left forearm.   POSTOPERATIVE DIAGNOSIS:  Displaced both bones fracture, left forearm.   OPERATION PERFORMED:  Open reduction internal fixation.   SURGEON:  Vonna Kotyk. Durward Fortes, M.D.   ASSISTANT:  Vonita Moss. Duffy, P.A.   ANESTHESIA:  General orotracheal anesthesia.   COMPLICATIONS:  None.   DESCRIPTION OF PROCEDURE:  With the patient comfortable on the operating  table and under general orotracheal anesthesia, the left upper extremity was  placed in an arm tourniquet.  The splint was then removed with obvious  motion at the midportion of both bones of the forearm.  There were multiple  small abrasions that were cleaned and we prepped the whole arm from the tips  of the fingers to tourniquet with Betadine scrub and then DuraPrep.  Sterile  draping was performed.  Extremity elevated.  It was Esmarch exsanguinated  with a proximal tourniquet at 280 mmHg.  The fractured radius was initially  approached.  A dorsal incision was used paralleling the dorsal border of the  mobile wad and the brachioradialis tendon.  By sharp dissection, the  incision was carried down to the subcutaneous tissue.  The interval between  the extensor muscles and the mobile wad was then developed. The mobile wad  was retracted volarly revealing the fracture.  By further blunt dissection,  the fracture was easily identified on direct visualization.  The fracture  was reduced placing a bone clamp both proximally and distally.  The Synthes  compression locking plate was utilized.  A 7-hole plate was placed along  the  radius maintaining reduction with bone clamps.  Holes on either side of the  fracture were placed in the compression mode.  The remaining five holes were  filled with the locking screws after appropriate drilling, measuring and  filling with the self tapping screws.  The bone clamps were removed.  We had  excellent reduction of the fracture, which was barely visible.  The wound  was then irrigated with saline solution.  Muscle and fascia were closed with  interrupted 2-0 Vicryl, the subcu with 2-0 Vicryl, skin closed with skin  clips.   The ulna was then approached along its cutaneous border.  A longitudinal  incision was then made via sharp dissection and carried down to subcutaneous  tissue.  Hematoma was evacuated.  Muscle fascia was incised and by blunt  dissection, fascia and muscle overlying the fracture was elevated.  I had  some difficulty with the fracture because of several spikes and it was  comminuted but placing bone clamps proximally and distally, the fracture was  then reduced and maintained with bone clamps.  A six hole locking plate was  then applied.  Three holes were then  drilled, measured and filled with  screws proximal and distal to the fracture.  Compression screws were placed  on either side and the remaining locking screws were utilized.  We had  excellent position.  Any small fragments were placed underneath the plate  and were maintained in good position.  Image intensification revealed  excellent position of the plate and the fracture.  The wound was irrigated  with saline solution, the fascia was closed with 2-0 Vicryl,  skin closed with skin clips.  Sterile bulky dressing was applied.  Tourniquet was deflated with immediate capillary refill to the fingers.  A  coaptation splint was applied and the patient awoke and returned to the post  anesthesia recovery room in satisfactory condition.       PWW/MEDQ  D:  12/24/2003  T:  12/24/2003  Job:  [id number]

## 2010-11-12 LAB — COMPREHENSIVE METABOLIC PANEL
Albumin: 3.3 — ABNORMAL LOW
Albumin: 3.4 — ABNORMAL LOW
BUN: 54 — ABNORMAL HIGH
BUN: 60 — ABNORMAL HIGH
CO2: 24
Calcium: 9.3
Chloride: 110
Chloride: 112
Creatinine, Ser: 3.63 — ABNORMAL HIGH
Creatinine, Ser: 4.08 — ABNORMAL HIGH
GFR calc non Af Amer: 15 — ABNORMAL LOW
GFR calc non Af Amer: 18 — ABNORMAL LOW
Total Bilirubin: 0.6
Total Bilirubin: 0.7

## 2010-11-12 LAB — CBC
HCT: 36.6 — ABNORMAL LOW
HCT: 37.2 — ABNORMAL LOW
HCT: 37.3 — ABNORMAL LOW
HCT: 41.7
Hemoglobin: 12.7 — ABNORMAL LOW
Hemoglobin: 12.9 — ABNORMAL LOW
MCHC: 32.3
MCHC: 32.4
MCHC: 32.5
MCHC: 32.7
MCHC: 32.8
MCHC: 33.4
MCHC: 33.9
MCV: 82.4
MCV: 82.6
MCV: 82.6
Platelets: 174
Platelets: 179
Platelets: 180
Platelets: 181
Platelets: 199
Platelets: 199
Platelets: 199
RBC: 4.7
RBC: 4.72
RBC: 4.8
RBC: 4.92
RBC: 5.04
RDW: 16.6 — ABNORMAL HIGH
RDW: 16.7 — ABNORMAL HIGH
RDW: 16.8 — ABNORMAL HIGH
RDW: 17 — ABNORMAL HIGH
RDW: 17 — ABNORMAL HIGH
WBC: 3.6 — ABNORMAL LOW
WBC: 4
WBC: 4
WBC: 4.6
WBC: 5.9
WBC: 6.7

## 2010-11-12 LAB — BASIC METABOLIC PANEL
BUN: 52 — ABNORMAL HIGH
BUN: 54 — ABNORMAL HIGH
BUN: 56 — ABNORMAL HIGH
CO2: 24
CO2: 30
Calcium: 8.5
Calcium: 8.6
Calcium: 9
Calcium: 9.1
Calcium: 9.1
Calcium: 9.2
Chloride: 101
Chloride: 110
Chloride: 99
Creatinine, Ser: 3.43 — ABNORMAL HIGH
Creatinine, Ser: 3.68 — ABNORMAL HIGH
Creatinine, Ser: 4.02 — ABNORMAL HIGH
Creatinine, Ser: 4.18 — ABNORMAL HIGH
GFR calc Af Amer: 18 — ABNORMAL LOW
GFR calc Af Amer: 19 — ABNORMAL LOW
GFR calc Af Amer: 19 — ABNORMAL LOW
GFR calc Af Amer: 19 — ABNORMAL LOW
GFR calc Af Amer: 21 — ABNORMAL LOW
GFR calc non Af Amer: 15 — ABNORMAL LOW
GFR calc non Af Amer: 16 — ABNORMAL LOW
GFR calc non Af Amer: 16 — ABNORMAL LOW
GFR calc non Af Amer: 19 — ABNORMAL LOW
Glucose, Bld: 84
Potassium: 3.4 — ABNORMAL LOW
Potassium: 4.5
Sodium: 133 — ABNORMAL LOW
Sodium: 140

## 2010-11-12 LAB — DIFFERENTIAL
Basophils Absolute: 0
Basophils Absolute: 0
Basophils Absolute: 0
Basophils Absolute: 0
Basophils Absolute: 0
Basophils Absolute: 0
Basophils Absolute: 0
Basophils Absolute: 0.1
Basophils Absolute: 0.1
Basophils Relative: 0
Basophils Relative: 1
Basophils Relative: 1
Basophils Relative: 1
Basophils Relative: 1
Basophils Relative: 2 — ABNORMAL HIGH
Eosinophils Absolute: 0.2
Eosinophils Absolute: 0.2
Eosinophils Relative: 1
Eosinophils Relative: 2
Eosinophils Relative: 3
Eosinophils Relative: 3
Eosinophils Relative: 7 — ABNORMAL HIGH
Lymphocytes Relative: 16
Lymphocytes Relative: 18
Lymphocytes Relative: 19
Lymphocytes Relative: 27
Lymphocytes Relative: 28
Lymphocytes Relative: 34
Lymphocytes Relative: 37
Lymphocytes Relative: 39
Lymphs Abs: 0.9
Lymphs Abs: 1
Lymphs Abs: 1.2
Lymphs Abs: 1.4
Lymphs Abs: 1.4
Lymphs Abs: 1.5
Lymphs Abs: 1.6
Monocytes Absolute: 0.2
Monocytes Absolute: 0.4
Monocytes Absolute: 0.4
Monocytes Absolute: 0.5
Monocytes Relative: 16 — ABNORMAL HIGH
Monocytes Relative: 19 — ABNORMAL HIGH
Monocytes Relative: 5
Monocytes Relative: 8
Neutro Abs: 1.1 — ABNORMAL LOW
Neutro Abs: 1.4 — ABNORMAL LOW
Neutro Abs: 1.7
Neutro Abs: 1.9
Neutro Abs: 2.6
Neutro Abs: 3.4
Neutro Abs: 4.5
Neutro Abs: 4.6
Neutro Abs: 4.7
Neutrophils Relative %: 34 — ABNORMAL LOW
Neutrophils Relative %: 38 — ABNORMAL LOW
Neutrophils Relative %: 42 — ABNORMAL LOW
Neutrophils Relative %: 52
Neutrophils Relative %: 58
Neutrophils Relative %: 69
Neutrophils Relative %: 75
Promyelocytes Absolute: 0

## 2010-11-12 LAB — OCCULT BLOOD X 1 CARD TO LAB, STOOL: Fecal Occult Bld: NEGATIVE

## 2010-11-12 LAB — CARDIAC PANEL(CRET KIN+CKTOT+MB+TROPI)
CK, MB: 2.7
CK, MB: 2.9
Relative Index: 1.9
Relative Index: 2
Total CK: 135
Total CK: 151
Total CK: 191
Troponin I: 0.05
Troponin I: 0.06

## 2010-11-12 LAB — HEPATIC FUNCTION PANEL
ALT: 32
AST: 29
Alkaline Phosphatase: 36 — ABNORMAL LOW
Bilirubin, Direct: 0.2
Indirect Bilirubin: 0.4
Total Bilirubin: 0.6

## 2010-11-12 LAB — URINE MICROSCOPIC-ADD ON

## 2010-11-12 LAB — FERRITIN: Ferritin: 237 (ref 22–322)

## 2010-11-12 LAB — B-NATRIURETIC PEPTIDE (CONVERTED LAB): Pro B Natriuretic peptide (BNP): 446 — ABNORMAL HIGH

## 2010-11-12 LAB — PROTIME-INR
INR: 1.6 — ABNORMAL HIGH
INR: 1.7 — ABNORMAL HIGH
INR: 2 — ABNORMAL HIGH
INR: 2.4 — ABNORMAL HIGH
INR: 4 — ABNORMAL HIGH
Prothrombin Time: 19.5 — ABNORMAL HIGH
Prothrombin Time: 20.8 — ABNORMAL HIGH
Prothrombin Time: 23.5 — ABNORMAL HIGH
Prothrombin Time: 41.1 — ABNORMAL HIGH

## 2010-11-12 LAB — URINALYSIS, ROUTINE W REFLEX MICROSCOPIC
Bilirubin Urine: NEGATIVE
Glucose, UA: NEGATIVE
Ketones, ur: NEGATIVE
Protein, ur: 30 — AB

## 2010-11-12 LAB — URINE CULTURE
Colony Count: 25000
Special Requests: NEGATIVE

## 2010-11-12 LAB — LIPID PANEL
Cholesterol: 117
Total CHOL/HDL Ratio: 3.9
VLDL: 15

## 2010-11-12 LAB — RETICULOCYTES: Retic Count, Absolute: 99.2

## 2010-11-12 LAB — PTH, INTACT AND CALCIUM
Calcium, Total (PTH): 9
PTH: 350 — ABNORMAL HIGH

## 2010-11-12 LAB — IRON AND TIBC
Iron: 46
Saturation Ratios: 13 — ABNORMAL LOW
UIBC: 312

## 2010-11-12 LAB — TSH: TSH: 0.691

## 2010-11-12 LAB — PHOSPHORUS: Phosphorus: 5.1 — ABNORMAL HIGH

## 2010-11-17 LAB — APTT: aPTT: 31

## 2010-11-17 LAB — DIFFERENTIAL
Eosinophils Absolute: 0.3
Eosinophils Relative: 5
Lymphocytes Relative: 29
Lymphs Abs: 1.6
Monocytes Absolute: 0.5
Monocytes Relative: 9

## 2010-11-17 LAB — PROTEIN ELECTROPH W RFLX QUANT IMMUNOGLOBULINS
Albumin ELP: 51.5 — ABNORMAL LOW
Alpha-1-Globulin: 4.3
Alpha-2-Globulin: 6.7 — ABNORMAL LOW
Beta 2: 23.2 — ABNORMAL HIGH
Beta Globulin: 6.5
Gamma Globulin: 7.8 — ABNORMAL LOW

## 2010-11-17 LAB — COMPREHENSIVE METABOLIC PANEL
ALT: 21
AST: 20
Albumin: 3.3 — ABNORMAL LOW
Calcium: 9
Creatinine, Ser: 3.02 — ABNORMAL HIGH
GFR calc Af Amer: 26 — ABNORMAL LOW
Sodium: 137

## 2010-11-17 LAB — CBC
MCHC: 34
MCV: 81.5
Platelets: 179
RBC: 4.26
WBC: 5.4

## 2010-11-17 LAB — PROTIME-INR
INR: 1.2
Prothrombin Time: 15.3 — ABNORMAL HIGH

## 2010-11-17 LAB — POCT I-STAT 4, (NA,K, GLUC, HGB,HCT)
HCT: 47
Operator id: 181601

## 2010-11-17 LAB — IMMUNOFIXATION ELECTROPHORESIS: IgG (Immunoglobin G), Serum: 2150 — ABNORMAL HIGH

## 2010-12-02 LAB — COMPREHENSIVE METABOLIC PANEL
ALT: 30
Alkaline Phosphatase: 48
BUN: 37 — ABNORMAL HIGH
CO2: 25
Calcium: 9.8
GFR calc non Af Amer: 26 — ABNORMAL LOW
Glucose, Bld: 124 — ABNORMAL HIGH
Sodium: 137

## 2010-12-02 LAB — PROTEIN ELECTROPH W RFLX QUANT IMMUNOGLOBULINS
Alpha-1-Globulin: 5.1 — ABNORMAL HIGH
Alpha-2-Globulin: 7.9
Beta 2: 21.2 — ABNORMAL HIGH
Beta Globulin: 6.9
Gamma Globulin: 7.5 — ABNORMAL LOW

## 2010-12-02 LAB — IGG, IGA, IGM: IgG (Immunoglobin G), Serum: 1940 — ABNORMAL HIGH

## 2010-12-02 LAB — IMMUNOFIXATION ADD-ON

## 2010-12-02 LAB — CBC
HCT: 38.5 — ABNORMAL LOW
Hemoglobin: 13.2
MCHC: 34.1
RBC: 4.69

## 2010-12-02 LAB — DIFFERENTIAL
Basophils Relative: 1
Eosinophils Absolute: 0.2
Neutrophils Relative %: 55

## 2011-03-10 ENCOUNTER — Other Ambulatory Visit: Payer: Self-pay

## 2011-03-10 ENCOUNTER — Encounter: Payer: Self-pay | Admitting: Cardiology

## 2011-03-10 ENCOUNTER — Ambulatory Visit (INDEPENDENT_AMBULATORY_CARE_PROVIDER_SITE_OTHER): Payer: Medicare HMO | Admitting: Cardiology

## 2011-03-10 ENCOUNTER — Ambulatory Visit (INDEPENDENT_AMBULATORY_CARE_PROVIDER_SITE_OTHER): Payer: Medicare HMO | Admitting: *Deleted

## 2011-03-10 VITALS — BP 131/84 | HR 78 | Resp 18 | Ht 74.0 in | Wt 263.0 lb

## 2011-03-10 DIAGNOSIS — I4891 Unspecified atrial fibrillation: Secondary | ICD-10-CM

## 2011-03-10 DIAGNOSIS — N186 End stage renal disease: Secondary | ICD-10-CM | POA: Insufficient documentation

## 2011-03-10 DIAGNOSIS — I429 Cardiomyopathy, unspecified: Secondary | ICD-10-CM | POA: Insufficient documentation

## 2011-03-10 DIAGNOSIS — E785 Hyperlipidemia, unspecified: Secondary | ICD-10-CM

## 2011-03-10 DIAGNOSIS — Z7901 Long term (current) use of anticoagulants: Secondary | ICD-10-CM

## 2011-03-10 DIAGNOSIS — N184 Chronic kidney disease, stage 4 (severe): Secondary | ICD-10-CM

## 2011-03-10 DIAGNOSIS — I10 Essential (primary) hypertension: Secondary | ICD-10-CM

## 2011-03-10 MED ORDER — DILTIAZEM HCL ER BEADS 300 MG PO CP24: 300.0000 mg | ORAL_CAPSULE | Freq: Every day | ORAL | Status: DC

## 2011-03-10 MED ORDER — CARVEDILOL 25 MG PO TABS: 25.0000 mg | ORAL_TABLET | Freq: Two times a day (BID) | ORAL | Status: DC

## 2011-03-10 MED ORDER — WARFARIN SODIUM 5 MG PO TABS: 5.0000 mg | ORAL_TABLET | Freq: Every day | ORAL | Status: DC

## 2011-03-10 MED ORDER — SIMVASTATIN 10 MG PO TABS: 10.0000 mg | ORAL_TABLET | Freq: Every day | ORAL | Status: DC

## 2011-03-10 MED ORDER — LOSARTAN POTASSIUM 100 MG PO TABS: 100.0000 mg | ORAL_TABLET | Freq: Every day | ORAL | Status: DC

## 2011-03-10 MED ORDER — CHOLINE FENOFIBRATE 135 MG PO CPDR: 135.0000 mg | DELAYED_RELEASE_CAPSULE | Freq: Every day | ORAL | Status: DC

## 2011-03-10 NOTE — Assessment & Plan Note (Signed)
Followed by Dr. Orson Ape with good LDL control in August 2012.

## 2011-03-10 NOTE — Assessment & Plan Note (Signed)
Followed by Dr. Lowanda Foster. Patient has never required hemodialysis, although has a left arm AV fistula.

## 2011-03-10 NOTE — Assessment & Plan Note (Signed)
Persistent, rate seems to be well-controlled. He reports no palpitations. Plan to continue Coumadin, establish followup in our Coumadin clinic. Prescription provided.

## 2011-03-10 NOTE — Assessment & Plan Note (Signed)
Blood pressure is reasonable today. No changes made to current regimen.

## 2011-03-10 NOTE — Progress Notes (Signed)
Clinical Summary Albert [name] is a 56 y.o.male referred by Dr. Orson Ape to establish cardiology followup. He is a former patient of [facility]. I was able to review old records on the patient. He has a history of cardiomyopathy, LVEF improving from approximately 25% at initial diagnosis several years ago, up to the range of 50-55% as of August 2011. He underwent noninvasive imaging for assessment of ischemia which was overall low risk, and never underwent cardiac catheterization in light of his substantial risk of contrast-induced nephropathy. He last saw Dr. Claiborne Billings in the office approximately 6 months ago.  Symptomatically, he reports no chest pain, has NYHA class II dyspnea on exertion. No palpitations or dizziness. He reports compliance with his medications.  He is followed by Dr. Lowanda Foster for renal insufficiency and already has a left arm fistula in place, never accessed.  Lab work from August of 2012 showed a BUN of 42 and creatinine of 3.4, potassium 4.9, hemoglobin 13.3, platelets 163, AST 19, ALT of 18, cholesterol 115, triglycerides 77, HDL 38, LDL 62.  He has had persistent atrial fibrillation now for some time, on Coumadin, denies any bleeding problems. This was previously followed by [facility].  He states that he has been sleeping well, tolerates CPAP.  No Known Allergies  Current Outpatient Prescriptions  Medication Sig Dispense Refill  . allopurinol (ZYLOPRIM) 100 MG tablet Take 200 mg by mouth daily.       . calcitRIOL (ROCALTROL) 0.5 MCG capsule Take 0.5 mcg by mouth daily.      . fish oil-omega-3 fatty acids 1000 MG capsule Take 2 g by mouth daily.        Marland Kitchen glipiZIDE (GLUCOTROL) 10 MG tablet Take 10 mg by mouth daily.       . insulin glargine (LANTUS) 100 UNIT/ML injection Inject into the skin at bedtime.      . carvedilol (COREG) 25 MG tablet Take 1 tablet (25 mg total) by mouth 2 (two) times daily with a meal.  60 tablet  6  . Choline Fenofibrate (TRILIPIX) 135 MG capsule Take 1  capsule (135 mg total) by mouth daily.  30 capsule  6  . diltiazem (TIAZAC) 300 MG 24 hr capsule Take 1 capsule (300 mg total) by mouth daily.  30 capsule  6  . losartan (COZAAR) 100 MG tablet Take 1 tablet (100 mg total) by mouth daily.  30 tablet  6  . simvastatin (ZOCOR) 10 MG tablet Take 1 tablet (10 mg total) by mouth at bedtime.  30 tablet  6  . warfarin (COUMADIN) 5 MG tablet Take 1 tablet (5 mg total) by mouth daily.  45 tablet  2    Past Medical History  Diagnosis Date  . Renal cell cancer   . Mixed hyperlipidemia   . Type 2 diabetes mellitus   . Paroxysmal atrial fibrillation   . Essential hypertension, benign   . Chronic renal insufficiency     Never required dialysis - Dr. Lowanda Foster  . Gout   . Diverticulosis of colon   . Colonic polyp   . Cardiomyopathy     LVEF 50-55% 8/11 - [facility]  . OSA (obstructive sleep apnea)     CPAP    Past Surgical History  Procedure Date  . Right nephrectomy   . Left arm av fistula 2009    Dr. [name]    Family History  Problem Relation Age of Onset  . Hypertension      Siblings  . Diabetes type II  Siblings    Social History Mr. Millea reports that he has quit smoking. His smoking use included Cigarettes. He has never used smokeless tobacco. Mr. Stenglein reports that he drinks alcohol.  Review of Systems No orthopnea or PND. No significant pitting edema. Negative except as outlined above.  Physical Examination Filed Vitals:   03/10/11 1351  BP: 131/84  Pulse: 78  Resp: 18   Obese male in no acute distress. HEENT: Conjunctiva and lids normal, oropharynx clear with moist mucosa. Neck: Supple, no elevated JVP or carotid bruits, no thyromegaly. Lungs: Clear to auscultation, nonlabored breathing at rest. Cardiac: Irregularly irregular, no S3, soft systolic murmur, no pericardial rub. Abdomen: Soft, nontender, bowel sounds present, no guarding or rebound. Extremities: No pitting edema, distal pulses 1-2+. Skin:  Warm and dry. Musculoskeletal: No kyphosis. Neuropsychiatric: Alert and oriented x3, affect grossly appropriate.  ECG Atrial fibrillation with NSST and PVC.    Problem List and Plan

## 2011-03-10 NOTE — Patient Instructions (Signed)
**Note De-Identified Galadriel Shroff Obfuscation** Your physician has requested that you have an echocardiogram. Echocardiography is a painless test that uses sound waves to create images of your heart. It provides your doctor with information about the size and shape of your heart and how well your heart's chambers and valves are working. This procedure takes approximately one hour. There are no restrictions for this procedure.  Your physician recommends that you continue on your current medications as directed. Please refer to the Current Medication list given to you today.  Your physician recommends that you schedule a follow-up appointment in: 6 months

## 2011-03-10 NOTE — Assessment & Plan Note (Signed)
History of possible nonischemic cardiomyopathy based on review of prior evaluation. Patient has had significant improvement in LVEF over time, last echocardiogram in 2011 however. He has tolerated medical therapy, reports stable NYHA class II dyspnea on exertion. We will arrange a followup echocardiogram and establish future visit.

## 2011-03-12 ENCOUNTER — Ambulatory Visit (HOSPITAL_COMMUNITY): Payer: Medicare HMO

## 2011-03-16 ENCOUNTER — Ambulatory Visit (HOSPITAL_COMMUNITY)
Admission: RE | Admit: 2011-03-16 | Discharge: 2011-03-16 | Disposition: A | Discharge status: Home / Self Care | Payer: Medicare HMO | Source: Ambulatory Visit | Attending: Cardiology | Admitting: Cardiology

## 2011-03-16 DIAGNOSIS — I517 Cardiomegaly: Secondary | ICD-10-CM

## 2011-03-16 DIAGNOSIS — I429 Cardiomyopathy, unspecified: Secondary | ICD-10-CM | POA: Insufficient documentation

## 2011-03-16 NOTE — Progress Notes (Signed)
*  PRELIMINARY RESULTS* Echocardiogram 2D Echocardiogram has been performed.  Tera Partridge 03/16/2011, 8:50 AM

## 2011-03-18 ENCOUNTER — Encounter: Payer: Self-pay | Admitting: *Deleted

## 2011-04-08 ENCOUNTER — Ambulatory Visit (INDEPENDENT_AMBULATORY_CARE_PROVIDER_SITE_OTHER): Payer: Medicare HMO | Admitting: *Deleted

## 2011-04-08 DIAGNOSIS — I4891 Unspecified atrial fibrillation: Secondary | ICD-10-CM

## 2011-04-08 DIAGNOSIS — Z7901 Long term (current) use of anticoagulants: Secondary | ICD-10-CM

## 2011-05-06 ENCOUNTER — Ambulatory Visit (INDEPENDENT_AMBULATORY_CARE_PROVIDER_SITE_OTHER): Payer: Medicare HMO | Admitting: *Deleted

## 2011-05-06 DIAGNOSIS — I4891 Unspecified atrial fibrillation: Secondary | ICD-10-CM

## 2011-05-06 DIAGNOSIS — Z7901 Long term (current) use of anticoagulants: Secondary | ICD-10-CM

## 2011-05-06 LAB — POCT INR: INR: 3.1

## 2011-05-17 ENCOUNTER — Other Ambulatory Visit: Payer: Self-pay | Admitting: Cardiology

## 2011-05-17 MED ORDER — DILTIAZEM HCL ER BEADS 300 MG PO CP24: 300.0000 mg | ORAL_CAPSULE | Freq: Every day | ORAL | Status: DC

## 2011-05-18 ENCOUNTER — Other Ambulatory Visit: Payer: Self-pay | Admitting: Cardiology

## 2011-05-19 MED ORDER — DILTIAZEM HCL ER BEADS 300 MG PO CP24: 300.0000 mg | ORAL_CAPSULE | Freq: Every day | ORAL | Status: DC

## 2011-06-03 ENCOUNTER — Ambulatory Visit (INDEPENDENT_AMBULATORY_CARE_PROVIDER_SITE_OTHER): Payer: Medicare HMO | Admitting: *Deleted

## 2011-06-03 DIAGNOSIS — Z7901 Long term (current) use of anticoagulants: Secondary | ICD-10-CM

## 2011-06-03 DIAGNOSIS — I4891 Unspecified atrial fibrillation: Secondary | ICD-10-CM

## 2011-06-03 LAB — POCT INR: INR: 3.3

## 2011-06-17 ENCOUNTER — Telehealth: Payer: Self-pay | Admitting: Cardiology

## 2011-06-17 NOTE — Telephone Encounter (Signed)
**Note De-Identified Yuepheng Schaller Obfuscation** Left detaled message on VM asking pt. to contact his PCP, Dr. Orson Ape, concerning Trilipix./LV

## 2011-06-17 NOTE — Telephone Encounter (Signed)
Patient is needing refill on Trilipix.  His insurance company is wanting him to be switched to Fenofibrate/fenofibrate Micronized due to cost.  If this is approved, can you please call this in to [facility] in [place]. / tg

## 2011-06-17 NOTE — Telephone Encounter (Signed)
Please refer this to Dr. Orson Ape. He has been following the patient's lipids. I do not have any recent lab numbers for review. I note that he is also on Zocor.

## 2011-06-17 NOTE — Telephone Encounter (Signed)
**Note De-Identified Albert [name] Obfuscation** Pt. Is currently taking Trilipix 135 mg qd, please advise./LV

## 2011-07-01 ENCOUNTER — Ambulatory Visit (INDEPENDENT_AMBULATORY_CARE_PROVIDER_SITE_OTHER): Payer: Medicare HMO | Admitting: *Deleted

## 2011-07-01 DIAGNOSIS — I4891 Unspecified atrial fibrillation: Secondary | ICD-10-CM

## 2011-07-01 DIAGNOSIS — Z7901 Long term (current) use of anticoagulants: Secondary | ICD-10-CM

## 2011-07-15 ENCOUNTER — Other Ambulatory Visit: Payer: Self-pay | Admitting: Cardiology

## 2011-07-15 MED ORDER — CARVEDILOL 25 MG PO TABS: 25.0000 mg | ORAL_TABLET | Freq: Two times a day (BID) | ORAL | Status: DC

## 2011-07-29 ENCOUNTER — Ambulatory Visit (INDEPENDENT_AMBULATORY_CARE_PROVIDER_SITE_OTHER): Payer: Medicare HMO | Admitting: *Deleted

## 2011-07-29 DIAGNOSIS — I4891 Unspecified atrial fibrillation: Secondary | ICD-10-CM

## 2011-07-29 DIAGNOSIS — Z7901 Long term (current) use of anticoagulants: Secondary | ICD-10-CM

## 2011-07-29 LAB — POCT INR: INR: 2.3

## 2011-09-02 ENCOUNTER — Other Ambulatory Visit: Payer: Self-pay | Admitting: Cardiology

## 2011-09-02 ENCOUNTER — Ambulatory Visit (INDEPENDENT_AMBULATORY_CARE_PROVIDER_SITE_OTHER): Payer: Medicare HMO | Admitting: *Deleted

## 2011-09-02 DIAGNOSIS — Z7901 Long term (current) use of anticoagulants: Secondary | ICD-10-CM

## 2011-09-02 DIAGNOSIS — I4891 Unspecified atrial fibrillation: Secondary | ICD-10-CM

## 2011-09-02 LAB — POCT INR: INR: 3.5

## 2011-09-02 MED ORDER — CARVEDILOL 25 MG PO TABS: 25.0000 mg | ORAL_TABLET | Freq: Two times a day (BID) | ORAL | Status: DC

## 2011-09-02 MED ORDER — WARFARIN SODIUM 5 MG PO TABS: 5.0000 mg | ORAL_TABLET | Freq: Every day | ORAL | Status: DC

## 2011-09-02 MED ORDER — DILTIAZEM HCL ER BEADS 300 MG PO CP24: 300.0000 mg | ORAL_CAPSULE | Freq: Every day | ORAL | Status: DC

## 2011-09-02 MED ORDER — LOSARTAN POTASSIUM 100 MG PO TABS: 100.0000 mg | ORAL_TABLET | Freq: Every day | ORAL | Status: DC

## 2011-09-09 ENCOUNTER — Encounter: Payer: Self-pay | Admitting: Cardiology

## 2011-09-09 ENCOUNTER — Ambulatory Visit (INDEPENDENT_AMBULATORY_CARE_PROVIDER_SITE_OTHER): Payer: Medicare HMO | Admitting: Cardiology

## 2011-09-09 VITALS — BP 140/90 | HR 82 | Ht 74.0 in | Wt 268.0 lb

## 2011-09-09 DIAGNOSIS — I4891 Unspecified atrial fibrillation: Secondary | ICD-10-CM

## 2011-09-09 DIAGNOSIS — I429 Cardiomyopathy, unspecified: Secondary | ICD-10-CM

## 2011-09-09 DIAGNOSIS — G4733 Obstructive sleep apnea (adult) (pediatric): Secondary | ICD-10-CM

## 2011-09-09 DIAGNOSIS — I10 Essential (primary) hypertension: Secondary | ICD-10-CM

## 2011-09-09 NOTE — Assessment & Plan Note (Signed)
Normal LVEF by followup echocardiogram. Would continue medical therapy. No plan for cardiac catheterization in light of CKD and high risk of contrast nephropathy.

## 2011-09-09 NOTE — Progress Notes (Signed)
Clinical Summary [name] is a 56 y.o.male presenting for followup. He was seen in January, established care with our practice at that time. Records reviewed in the previous note.  Followup echocardiogram shows normal LVEF of 55-60% on medical therapy. He did have increased RV size with mildly to moderately reduced function, possibly sequela of OSA, currently on CPAP.  He is now followed in our Coumadin clinic. No bleeding problems reported.  For now would continue medical therapy. He has not required hemodialysis as yet with CKD, followed by Dr. Lowanda [name].  No Known Allergies  Current Outpatient Prescriptions  Medication Sig Dispense Refill  . allopurinol (ZYLOPRIM) 100 MG tablet Take 200 mg by mouth daily.       . calcitRIOL (ROCALTROL) 0.5 MCG capsule Take 0.5 mcg by mouth daily.      . carvedilol (COREG) 25 MG tablet Take 1 tablet (25 mg total) by mouth 2 (two) times daily with a meal.  180 tablet  3  . diltiazem (TIAZAC) 300 MG 24 hr capsule Take 1 capsule (300 mg total) by mouth daily.  90 capsule  3  . fenofibrate 160 MG tablet Take 160 mg by mouth daily.      . fish oil-omega-3 fatty acids 1000 MG capsule Take 2 g by mouth daily.        Marland Kitchen glipiZIDE (GLUCOTROL) 10 MG tablet Take 10 mg by mouth daily.       . insulin glargine (LANTUS) 100 UNIT/ML injection Inject 30 Units into the skin at bedtime.       Marland Kitchen losartan (COZAAR) 100 MG tablet Take 1 tablet (100 mg total) by mouth daily.  90 tablet  3  . simvastatin (ZOCOR) 10 MG tablet Take 1 tablet (10 mg total) by mouth at bedtime.  30 tablet  6  . warfarin (COUMADIN) 5 MG tablet Take 1 tablet (5 mg total) by mouth daily.  135 tablet  3    Past Medical History  Diagnosis Date  . Renal cell cancer   . Mixed hyperlipidemia   . Type 2 diabetes mellitus   . Paroxysmal atrial fibrillation   . Essential hypertension, benign   . Chronic renal insufficiency     Never required dialysis - Dr. Lowanda [name]  . Gout   . Diverticulosis  of colon   . Colonic polyp   . Cardiomyopathy     LVEF 50-55% 8/11 - [facility]  . OSA (obstructive sleep apnea)     CPAP    Social History [name] that he has quit smoking. His smoking use included Cigarettes. He has never used smokeless tobacco. [name] that he drinks alcohol.  Review of Systems No sense of palpitations. No angina, NYHA class II DOE - stable. Otherwise negative.  Physical Examination Filed Vitals:   09/09/11 1431  BP: 140/90  Pulse: 82    Obese male in no acute distress.  HEENT: Conjunctiva and lids normal, oropharynx clear with moist mucosa.  Neck: Supple, no elevated JVP or carotid bruits, no thyromegaly.  Lungs: Clear to auscultation, nonlabored breathing at rest.  Cardiac: Irregularly irregular, no S3, soft systolic murmur, no pericardial rub.  Abdomen: Soft, nontender, bowel sounds present, no guarding or rebound.  Extremities: No pitting edema, distal pulses 1-2+.  Skin: Warm and dry.  Musculoskeletal: No kyphosis.  Neuropsychiatric: Alert and oriented x3, affect grossly appropriate.   ECG Atrial fibrillation with NSST changes.  Problem List and Plan   Atrial fibrillation Stable on medical therapy. No  changes made. Keep followup in the Coumadin clinic.  Secondary cardiomyopathy, unspecified Normal LVEF by followup echocardiogram. Would continue medical therapy. No plan for cardiac catheterization in light of CKD and high risk of contrast nephropathy.  OBSTRUCTIVE SLEEP APNEA Continue with CPAP.  Essential hypertension, benign Blood pressure is up today. We reviewed his medications and discussed exercise.    [name], M.D., F.A.C.C.

## 2011-09-09 NOTE — Assessment & Plan Note (Signed)
Blood pressure is up today. We reviewed his medications and discussed exercise.

## 2011-09-09 NOTE — Assessment & Plan Note (Signed)
Stable on medical therapy. No changes made. Keep followup in the Coumadin clinic.

## 2011-09-09 NOTE — Assessment & Plan Note (Signed)
Continue with CPAP

## 2011-09-09 NOTE — Patient Instructions (Addendum)
Your physician recommends that you schedule a follow-up appointment in: 6 months  

## 2011-09-13 ENCOUNTER — Telehealth: Payer: Self-pay | Admitting: Cardiology

## 2011-09-13 MED ORDER — CARVEDILOL 25 MG PO TABS: 25.0000 mg | ORAL_TABLET | Freq: Two times a day (BID) | ORAL | Status: DC

## 2011-09-13 MED ORDER — WARFARIN SODIUM 5 MG PO TABS: 5.0000 mg | ORAL_TABLET | Freq: Every day | ORAL | Status: DC

## 2011-09-13 MED ORDER — SIMVASTATIN 10 MG PO TABS: 10.0000 mg | ORAL_TABLET | Freq: Every day | ORAL | Status: DC

## 2011-09-13 MED ORDER — DILTIAZEM HCL ER BEADS 300 MG PO CP24: 300.0000 mg | ORAL_CAPSULE | Freq: Every day | ORAL | Status: DC

## 2011-09-13 MED ORDER — LOSARTAN POTASSIUM 100 MG PO TABS: 100.0000 mg | ORAL_TABLET | Freq: Every day | ORAL | Status: DC

## 2011-09-13 NOTE — Telephone Encounter (Signed)
Patient states that [facility] told him they have not received refills on meds.  It is listed in chart that they were approved on 09/02/11.  Please fax to [phone]. / tg

## 2011-09-13 NOTE — Telephone Encounter (Signed)
Refills sent again

## 2011-09-17 ENCOUNTER — Other Ambulatory Visit: Payer: Self-pay | Admitting: Cardiology

## 2011-09-17 MED ORDER — LOSARTAN POTASSIUM 100 MG PO TABS: 100.0000 mg | ORAL_TABLET | Freq: Every day | ORAL | Status: DC

## 2011-09-17 MED ORDER — DILTIAZEM HCL ER BEADS 300 MG PO CP24: 300.0000 mg | ORAL_CAPSULE | Freq: Every day | ORAL | Status: DC

## 2011-09-17 MED ORDER — CARVEDILOL 25 MG PO TABS: 25.0000 mg | ORAL_TABLET | Freq: Two times a day (BID) | ORAL | Status: DC

## 2011-09-17 NOTE — Telephone Encounter (Signed)
Patient wants 30 day supply of the above meds called to [facility] in [place].  Says that he has not received them from [facility] yet and he is out. / tg

## 2011-09-30 ENCOUNTER — Ambulatory Visit (INDEPENDENT_AMBULATORY_CARE_PROVIDER_SITE_OTHER): Payer: Medicare HMO | Admitting: *Deleted

## 2011-09-30 DIAGNOSIS — Z7901 Long term (current) use of anticoagulants: Secondary | ICD-10-CM

## 2011-09-30 DIAGNOSIS — I4891 Unspecified atrial fibrillation: Secondary | ICD-10-CM

## 2011-11-04 ENCOUNTER — Ambulatory Visit (INDEPENDENT_AMBULATORY_CARE_PROVIDER_SITE_OTHER): Payer: Medicare HMO | Admitting: *Deleted

## 2011-11-04 DIAGNOSIS — Z7901 Long term (current) use of anticoagulants: Secondary | ICD-10-CM

## 2011-11-04 DIAGNOSIS — I4891 Unspecified atrial fibrillation: Secondary | ICD-10-CM

## 2011-11-04 LAB — POCT INR: INR: 3.1

## 2011-11-16 ENCOUNTER — Other Ambulatory Visit: Payer: Self-pay | Admitting: Cardiology

## 2011-11-16 MED ORDER — LOSARTAN POTASSIUM 100 MG PO TABS: 100.0000 mg | ORAL_TABLET | Freq: Every day | ORAL | Status: DC

## 2011-11-16 MED ORDER — CARVEDILOL 25 MG PO TABS: 25.0000 mg | ORAL_TABLET | Freq: Two times a day (BID) | ORAL | Status: DC

## 2011-11-19 ENCOUNTER — Other Ambulatory Visit: Payer: Self-pay | Admitting: *Deleted

## 2011-11-19 MED ORDER — CARVEDILOL 25 MG PO TABS: 25.0000 mg | ORAL_TABLET | Freq: Two times a day (BID) | ORAL | Status: DC

## 2011-11-19 MED ORDER — LOSARTAN POTASSIUM 100 MG PO TABS: 100.0000 mg | ORAL_TABLET | Freq: Every day | ORAL | Status: DC

## 2011-12-09 ENCOUNTER — Ambulatory Visit (INDEPENDENT_AMBULATORY_CARE_PROVIDER_SITE_OTHER): Payer: Medicare HMO | Admitting: *Deleted

## 2011-12-09 DIAGNOSIS — I4891 Unspecified atrial fibrillation: Secondary | ICD-10-CM

## 2011-12-09 DIAGNOSIS — Z7901 Long term (current) use of anticoagulants: Secondary | ICD-10-CM

## 2011-12-09 LAB — POCT INR: INR: 3.2

## 2012-01-06 ENCOUNTER — Ambulatory Visit (INDEPENDENT_AMBULATORY_CARE_PROVIDER_SITE_OTHER): Payer: Medicare HMO | Admitting: *Deleted

## 2012-01-06 DIAGNOSIS — I4891 Unspecified atrial fibrillation: Secondary | ICD-10-CM

## 2012-01-06 DIAGNOSIS — Z7901 Long term (current) use of anticoagulants: Secondary | ICD-10-CM

## 2012-01-06 LAB — POCT INR: INR: 2

## 2012-02-03 ENCOUNTER — Ambulatory Visit (INDEPENDENT_AMBULATORY_CARE_PROVIDER_SITE_OTHER): Payer: Medicare HMO | Admitting: *Deleted

## 2012-02-03 DIAGNOSIS — Z7901 Long term (current) use of anticoagulants: Secondary | ICD-10-CM

## 2012-02-03 DIAGNOSIS — I4891 Unspecified atrial fibrillation: Secondary | ICD-10-CM

## 2012-03-08 ENCOUNTER — Ambulatory Visit (INDEPENDENT_AMBULATORY_CARE_PROVIDER_SITE_OTHER): Payer: Medicare HMO | Admitting: *Deleted

## 2012-03-08 ENCOUNTER — Ambulatory Visit (INDEPENDENT_AMBULATORY_CARE_PROVIDER_SITE_OTHER): Payer: Medicare HMO | Admitting: Cardiology

## 2012-03-08 ENCOUNTER — Encounter: Payer: Self-pay | Admitting: Cardiology

## 2012-03-08 VITALS — BP 123/70 | HR 51 | Ht 74.0 in | Wt 270.1 lb

## 2012-03-08 DIAGNOSIS — I4891 Unspecified atrial fibrillation: Secondary | ICD-10-CM

## 2012-03-08 DIAGNOSIS — I429 Cardiomyopathy, unspecified: Secondary | ICD-10-CM

## 2012-03-08 DIAGNOSIS — I10 Essential (primary) hypertension: Secondary | ICD-10-CM

## 2012-03-08 DIAGNOSIS — Z7901 Long term (current) use of anticoagulants: Secondary | ICD-10-CM

## 2012-03-08 NOTE — Progress Notes (Signed)
Clinical Summary Albert [name] is a 57 y.o.male presenting for followup. He was seen in July 2013. He reports no chest pain symptoms, has occasional palpitations, rarely feels fast heartbeats but this usually occurs with exertion. He denies any bleeding problems on Coumadin. He does feel fatigued in general, heart rate is in the 50s today.  ECG today shows rate-controlled atrial fibrillation with nonspecific ST-T changes.  Echocardiogram from January 2013 demonstrated mild to moderate LVH with LVEF 55-60%, mildly to moderately dilated right ventricle with reduced function, PASP 50 mm mercury.  No Known Allergies  Current Outpatient Prescriptions  Medication Sig Dispense Refill  . allopurinol (ZYLOPRIM) 100 MG tablet Take 200 mg by mouth daily.       . calcitRIOL (ROCALTROL) 0.5 MCG capsule Take 0.5 mcg by mouth daily.      . carvedilol (COREG) 25 MG tablet Take 1 tablet (25 mg total) by mouth 2 (two) times daily with a meal.  180 tablet  3  . Cholecalciferol (VITAMIN D-3 PO) Take 1,000 Units by mouth daily.      . fenofibrate 160 MG tablet Take 160 mg by mouth daily.      . fish oil-omega-3 fatty acids 1000 MG capsule Take 3 g by mouth daily.       Marland Kitchen glipiZIDE (GLUCOTROL) 10 MG tablet Take 10 mg by mouth daily.       . insulin glargine (LANTUS) 100 UNIT/ML injection Inject 20 Units into the skin at bedtime.       Marland Kitchen losartan (COZAAR) 100 MG tablet Take 1 tablet (100 mg total) by mouth daily.  90 tablet  3  . simvastatin (ZOCOR) 10 MG tablet Take 1 tablet (10 mg total) by mouth at bedtime.  30 tablet  6  . warfarin (COUMADIN) 5 MG tablet Take 1 tablet (5 mg total) by mouth daily.  90 tablet  0  . [DISCONTINUED] diltiazem (TIAZAC) 300 MG 24 hr capsule Take 1 capsule (300 mg total) by mouth daily.  90 capsule  3    Past Medical History  Diagnosis Date  . Renal cell cancer   . Mixed hyperlipidemia   . Type 2 diabetes mellitus   . Paroxysmal atrial fibrillation   . Essential  hypertension, benign   . Chronic renal insufficiency     Never required dialysis - Dr. Lowanda Foster  . Gout   . Diverticulosis of colon   . Colonic polyp   . Cardiomyopathy     LVEF 50-55% 8/11 - [facility]  . OSA (obstructive sleep apnea)     CPAP    Social History Mr. Crepeau reports that he has quit smoking. His smoking use included Cigarettes. He has never used smokeless tobacco. Mr. Spedding reports that he drinks alcohol.  Review of Systems Negative except as outlined.  Physical Examination Filed Vitals:   03/08/12 1001  BP: 123/70  Pulse: 51   Filed Weights   03/08/12 1001  Weight: 270 lb 1.3 oz (122.507 kg)   Obese male in no acute distress.  HEENT: Conjunctiva and lids normal, oropharynx clear with moist mucosa.  Neck: Supple, no elevated JVP or carotid bruits, no thyromegaly.  Lungs: Clear to auscultation, nonlabored breathing at rest.  Cardiac: Irregularly irregular, no S3, soft systolic murmur, no pericardial rub.  Abdomen: Soft, nontender, bowel sounds present, no guarding or rebound.  Extremities: No pitting edema, distal pulses 1-2+.  Skin: Warm and dry.  Musculoskeletal: No kyphosis.  Neuropsychiatric: Alert and oriented x3, affect grossly appropriate.  Problem List and Plan   Secondary cardiomyopathy, unspecified LV function has normalized on medical therapy as of last year by echocardiogram. No new symptoms of progressive shortness of breath or chest pain.. Continue observation.  Atrial fibrillation Paroxysmal to persistent, bradycardic today. Does complain of fatigue. We will reduce his Cardizem CD to 240 mg daily to see if this helps.  Essential hypertension, benign Blood pressure well controlled today.    [name], M.D., F.A.C.C.

## 2012-03-08 NOTE — Assessment & Plan Note (Signed)
Blood pressure well-controlled today. 

## 2012-03-08 NOTE — Patient Instructions (Addendum)
Your physician recommends that you schedule a follow-up appointment in: [place] physician has recommended you make the following change in your medication:   1) START DILTIAZEM ( TIAZAC) 240MG  DAILY ONCE YOU HAVE COMPLETED YOUR CURRENT REFILL OF 300MG  (NOTED 3 MONTH SUPPLY JUST PICKED UP) CALL OFFICE TO SEND IN YOUR NEW MEDICATION FOR 240MG  PER NOTED PT USES MAIL ORDER AND WILL UNTIL YOU CALL TO ADVISE REFILL TO BE SENT IN

## 2012-03-08 NOTE — Assessment & Plan Note (Signed)
Paroxysmal to persistent, bradycardic today. Does complain of fatigue. We will reduce his Cardizem CD to 240 mg daily to see if this helps.

## 2012-03-08 NOTE — Assessment & Plan Note (Signed)
LV function has normalized on medical therapy as of last year by echocardiogram. No new symptoms of progressive shortness of breath or chest pain.. Continue observation.

## 2012-04-08 ENCOUNTER — Other Ambulatory Visit: Payer: Self-pay

## 2012-04-20 ENCOUNTER — Ambulatory Visit (INDEPENDENT_AMBULATORY_CARE_PROVIDER_SITE_OTHER): Payer: Medicare HMO | Admitting: *Deleted

## 2012-04-20 DIAGNOSIS — Z7901 Long term (current) use of anticoagulants: Secondary | ICD-10-CM

## 2012-04-20 LAB — POCT INR: INR: 2.6

## 2012-05-08 ENCOUNTER — Telehealth: Payer: Self-pay | Admitting: Cardiology

## 2012-05-08 MED ORDER — WARFARIN SODIUM 5 MG PO TABS: 5.0000 mg | ORAL_TABLET | Freq: Every day | ORAL | Status: DC

## 2012-05-08 NOTE — Telephone Encounter (Signed)
rx sent to pharmacy by e-script  

## 2012-05-08 NOTE — Telephone Encounter (Signed)
Please call in warfrin to [facility]

## 2012-06-01 ENCOUNTER — Ambulatory Visit (INDEPENDENT_AMBULATORY_CARE_PROVIDER_SITE_OTHER): Payer: Medicare HMO | Admitting: *Deleted

## 2012-06-01 DIAGNOSIS — I4891 Unspecified atrial fibrillation: Secondary | ICD-10-CM

## 2012-06-01 DIAGNOSIS — Z7901 Long term (current) use of anticoagulants: Secondary | ICD-10-CM

## 2012-07-13 ENCOUNTER — Ambulatory Visit (INDEPENDENT_AMBULATORY_CARE_PROVIDER_SITE_OTHER): Payer: Medicare HMO | Admitting: *Deleted

## 2012-07-13 DIAGNOSIS — I4891 Unspecified atrial fibrillation: Secondary | ICD-10-CM

## 2012-07-13 DIAGNOSIS — Z7901 Long term (current) use of anticoagulants: Secondary | ICD-10-CM

## 2012-07-13 LAB — POCT INR: INR: 3.3

## 2012-08-10 ENCOUNTER — Ambulatory Visit (INDEPENDENT_AMBULATORY_CARE_PROVIDER_SITE_OTHER): Payer: Medicare HMO | Admitting: *Deleted

## 2012-08-10 DIAGNOSIS — I4891 Unspecified atrial fibrillation: Secondary | ICD-10-CM

## 2012-08-10 DIAGNOSIS — Z7901 Long term (current) use of anticoagulants: Secondary | ICD-10-CM

## 2012-08-10 LAB — POCT INR: INR: 3.2

## 2012-08-26 ENCOUNTER — Other Ambulatory Visit: Payer: Self-pay | Admitting: Cardiology

## 2012-08-28 NOTE — Telephone Encounter (Signed)
Medication sent via escribe for TAZTIA XT 300 MG 24 hr capsule

## 2012-08-31 ENCOUNTER — Ambulatory Visit (INDEPENDENT_AMBULATORY_CARE_PROVIDER_SITE_OTHER): Payer: Medicare HMO | Admitting: *Deleted

## 2012-08-31 DIAGNOSIS — I4891 Unspecified atrial fibrillation: Secondary | ICD-10-CM

## 2012-08-31 DIAGNOSIS — Z7901 Long term (current) use of anticoagulants: Secondary | ICD-10-CM

## 2012-09-18 ENCOUNTER — Ambulatory Visit (INDEPENDENT_AMBULATORY_CARE_PROVIDER_SITE_OTHER): Payer: Medicare HMO | Admitting: *Deleted

## 2012-09-18 DIAGNOSIS — Z7901 Long term (current) use of anticoagulants: Secondary | ICD-10-CM

## 2012-09-18 DIAGNOSIS — I4891 Unspecified atrial fibrillation: Secondary | ICD-10-CM

## 2012-10-12 ENCOUNTER — Ambulatory Visit (INDEPENDENT_AMBULATORY_CARE_PROVIDER_SITE_OTHER): Payer: Medicare HMO | Admitting: *Deleted

## 2012-10-12 DIAGNOSIS — I4891 Unspecified atrial fibrillation: Secondary | ICD-10-CM

## 2012-10-12 DIAGNOSIS — Z7901 Long term (current) use of anticoagulants: Secondary | ICD-10-CM

## 2012-11-09 ENCOUNTER — Ambulatory Visit (INDEPENDENT_AMBULATORY_CARE_PROVIDER_SITE_OTHER): Payer: Medicare HMO | Admitting: *Deleted

## 2012-11-09 DIAGNOSIS — I4891 Unspecified atrial fibrillation: Secondary | ICD-10-CM

## 2012-11-09 DIAGNOSIS — Z7901 Long term (current) use of anticoagulants: Secondary | ICD-10-CM

## 2012-11-16 ENCOUNTER — Other Ambulatory Visit: Payer: Self-pay | Admitting: Cardiology

## 2012-11-16 MED ORDER — CARVEDILOL 25 MG PO TABS: 25.0000 mg | ORAL_TABLET | Freq: Two times a day (BID) | ORAL | Status: DC

## 2012-11-16 MED ORDER — LOSARTAN POTASSIUM 100 MG PO TABS: 100.0000 mg | ORAL_TABLET | Freq: Every day | ORAL | Status: DC

## 2012-12-21 ENCOUNTER — Ambulatory Visit (INDEPENDENT_AMBULATORY_CARE_PROVIDER_SITE_OTHER): Payer: Medicare HMO | Admitting: *Deleted

## 2012-12-21 DIAGNOSIS — Z7901 Long term (current) use of anticoagulants: Secondary | ICD-10-CM

## 2012-12-21 DIAGNOSIS — I4891 Unspecified atrial fibrillation: Secondary | ICD-10-CM

## 2012-12-21 LAB — POCT INR: INR: 2.6

## 2012-12-28 ENCOUNTER — Other Ambulatory Visit: Payer: Self-pay

## 2013-02-01 ENCOUNTER — Ambulatory Visit (INDEPENDENT_AMBULATORY_CARE_PROVIDER_SITE_OTHER): Payer: Medicare HMO | Admitting: *Deleted

## 2013-02-01 DIAGNOSIS — I4891 Unspecified atrial fibrillation: Secondary | ICD-10-CM

## 2013-02-01 DIAGNOSIS — Z7901 Long term (current) use of anticoagulants: Secondary | ICD-10-CM

## 2013-03-01 ENCOUNTER — Ambulatory Visit (INDEPENDENT_AMBULATORY_CARE_PROVIDER_SITE_OTHER): Payer: Medicare HMO | Admitting: *Deleted

## 2013-03-01 DIAGNOSIS — Z7901 Long term (current) use of anticoagulants: Secondary | ICD-10-CM

## 2013-03-01 DIAGNOSIS — I4891 Unspecified atrial fibrillation: Secondary | ICD-10-CM

## 2013-03-01 LAB — POCT INR: INR: 2.5

## 2013-03-29 ENCOUNTER — Ambulatory Visit (INDEPENDENT_AMBULATORY_CARE_PROVIDER_SITE_OTHER): Payer: Commercial Managed Care - HMO | Admitting: *Deleted

## 2013-03-29 DIAGNOSIS — Z5181 Encounter for therapeutic drug level monitoring: Secondary | ICD-10-CM

## 2013-03-29 DIAGNOSIS — Z7901 Long term (current) use of anticoagulants: Secondary | ICD-10-CM

## 2013-03-29 DIAGNOSIS — I4891 Unspecified atrial fibrillation: Secondary | ICD-10-CM

## 2013-03-29 LAB — POCT INR: INR: 2.2

## 2013-04-26 ENCOUNTER — Ambulatory Visit (INDEPENDENT_AMBULATORY_CARE_PROVIDER_SITE_OTHER): Payer: Commercial Managed Care - HMO | Admitting: *Deleted

## 2013-04-26 DIAGNOSIS — Z5181 Encounter for therapeutic drug level monitoring: Secondary | ICD-10-CM

## 2013-04-26 DIAGNOSIS — Z7901 Long term (current) use of anticoagulants: Secondary | ICD-10-CM

## 2013-04-26 DIAGNOSIS — I4891 Unspecified atrial fibrillation: Secondary | ICD-10-CM

## 2013-04-26 LAB — POCT INR: INR: 2

## 2013-04-26 MED ORDER — WARFARIN SODIUM 2.5 MG PO TABS: ORAL_TABLET | ORAL | Status: DC

## 2013-05-18 ENCOUNTER — Ambulatory Visit (INDEPENDENT_AMBULATORY_CARE_PROVIDER_SITE_OTHER): Payer: Commercial Managed Care - HMO | Admitting: Cardiology

## 2013-05-18 ENCOUNTER — Encounter: Payer: Self-pay | Admitting: Cardiology

## 2013-05-18 VITALS — BP 161/96 | HR 83 | Ht 74.0 in | Wt 261.0 lb

## 2013-05-18 DIAGNOSIS — I10 Essential (primary) hypertension: Secondary | ICD-10-CM

## 2013-05-18 DIAGNOSIS — N184 Chronic kidney disease, stage 4 (severe): Secondary | ICD-10-CM

## 2013-05-18 DIAGNOSIS — E785 Hyperlipidemia, unspecified: Secondary | ICD-10-CM

## 2013-05-18 DIAGNOSIS — I4891 Unspecified atrial fibrillation: Secondary | ICD-10-CM

## 2013-05-18 DIAGNOSIS — I429 Cardiomyopathy, unspecified: Secondary | ICD-10-CM

## 2013-05-18 NOTE — Assessment & Plan Note (Signed)
LVEF normalized on medical therapy, last assessed at 55-60%.

## 2013-05-18 NOTE — Assessment & Plan Note (Signed)
He continues on Pravachol, lipid followup with Dr. Orson Ape.

## 2013-05-18 NOTE — Assessment & Plan Note (Signed)
Permanent, continue strategy of heart rate control and anticoagulation. ECG today reviewed showing atrial fibrillation with aberrantly conducted beats, nonspecific ST-T changes.

## 2013-05-18 NOTE — Assessment & Plan Note (Signed)
Blood pressure elevated today. Keep follow with Dr. Orson Ape.

## 2013-05-18 NOTE — Progress Notes (Signed)
Clinical Summary [name] is a 58 y.o.male last seen in January 2014. He has been doing fairly well since I last saw him. No palpitations or progressive shortness of breath, no chest pain.  He continues to follow in the Coumadin clinic for anticoagulation management. No bleeding problems. He reports compliance with his remaining medications. No orthopnea or PND.  Echocardiogram from January 2013 demonstrated mild to moderate LVH with LVEF 55-60%, mildly to moderately dilated right ventricle with reduced function, PASP 50 mm mercury.   No Known Allergies  Current Outpatient Prescriptions  Medication Sig Dispense Refill  . allopurinol (ZYLOPRIM) 100 MG tablet Take 200 mg by mouth daily.       . calcitRIOL (ROCALTROL) 0.5 MCG capsule Take 0.5 mcg by mouth daily.      . carvedilol (COREG) 25 MG tablet Take 1 tablet (25 mg total) by mouth 2 (two) times daily with a meal.  180 tablet  3  . Cholecalciferol (VITAMIN D-3 PO) Take 1,000 Units by mouth daily.      . fenofibrate 160 MG tablet Take 160 mg by mouth daily.      . fish oil-omega-3 fatty acids 1000 MG capsule Take 3 g by mouth daily.       Marland Kitchen glipiZIDE (GLUCOTROL) 10 MG tablet Take 10 mg by mouth daily.       . insulin glargine (LANTUS) 100 UNIT/ML injection Inject 20 Units into the skin at bedtime.       Marland Kitchen losartan (COZAAR) 100 MG tablet Take 1 tablet (100 mg total) by mouth daily.  90 tablet  3  . pravastatin (PRAVACHOL) 10 MG tablet Take 10 mg by mouth daily.      . sodium bicarbonate 325 MG tablet Take 325 mg by mouth 2 (two) times daily.      Marland Kitchen TAZTIA XT 300 MG 24 hr capsule TAKE 1 CAPSULE EVERY DAY  90 capsule  3  . warfarin (COUMADIN) 2.5 MG tablet Take 1 tablet daily except 1/2 tablet on Saturdays or as directed  90 tablet  3   No current facility-administered medications for this visit.    Past Medical History  Diagnosis Date  . Renal cell cancer   . Mixed hyperlipidemia   . Type 2 diabetes mellitus   .  Paroxysmal atrial fibrillation   . Essential hypertension, benign   . Chronic renal insufficiency     Never required dialysis - Dr. Lowanda Foster  . Gout   . Diverticulosis of colon   . Colonic polyp   . Cardiomyopathy     LVEF 50-55% 8/11 - [facility]  . OSA (obstructive sleep apnea)     CPAP    Social History [name] that he has quit smoking. His smoking use included Cigarettes. He smoked 0.00 packs per day. He has never used smokeless tobacco. [name] that he drinks alcohol.  Review of Systems Negative except as outlined.  Physical Examination Filed Vitals:   05/18/13 1132  BP: 161/96  Pulse: 83   Filed Weights   05/18/13 1132  Weight: 261 lb (118.389 kg)    Obese male in no acute distress.  HEENT: Conjunctiva and lids normal, oropharynx clear with moist mucosa.  Neck: Supple, no elevated JVP or carotid bruits, no thyromegaly.  Lungs: Clear to auscultation, nonlabored breathing at rest.  Cardiac: Irregularly irregular, no S3, soft systolic murmur, no pericardial rub.  Abdomen: Soft, nontender, bowel sounds present, no guarding or rebound.  Extremities: No pitting edema,  distal pulses 1-2+.  Skin: Warm and dry.  Musculoskeletal: No kyphosis.  Neuropsychiatric: Alert and oriented x3, affect grossly appropriate.   Problem List and Plan   Atrial fibrillation Permanent, continue strategy of heart rate control and anticoagulation. ECG today reviewed showing atrial fibrillation with aberrantly conducted beats, nonspecific ST-T changes.  Secondary cardiomyopathy, unspecified LVEF normalized on medical therapy, last assessed at 55-60%.  HYPERLIPIDEMIA He continues on Pravachol, lipid followup with Dr. Orson Ape.  Essential hypertension, benign Blood pressure elevated today. Keep follow with Dr. Orson Ape.  CKD (chronic kidney disease), stage IV Followed by Dr. Lowanda Foster.    Satira Albert [name], M.D., F.A.C.C.

## 2013-05-18 NOTE — Patient Instructions (Signed)
Your physician wants you to follow-up in: 1 year You will receive a reminder letter in the mail two months in advance. If you don't receive a letter, please call our office to schedule the follow-up appointment.    Your physician recommends that you continue on your current medications as directed. Please refer to the Current Medication list given to you today.     Thank you for choosing [facility] !  

## 2013-05-18 NOTE — Assessment & Plan Note (Signed)
Followed by Dr. Lowanda Foster.

## 2013-05-24 ENCOUNTER — Ambulatory Visit (INDEPENDENT_AMBULATORY_CARE_PROVIDER_SITE_OTHER): Payer: Commercial Managed Care - HMO | Admitting: *Deleted

## 2013-05-24 DIAGNOSIS — Z5181 Encounter for therapeutic drug level monitoring: Secondary | ICD-10-CM

## 2013-05-24 DIAGNOSIS — Z7901 Long term (current) use of anticoagulants: Secondary | ICD-10-CM

## 2013-05-24 DIAGNOSIS — I4891 Unspecified atrial fibrillation: Secondary | ICD-10-CM

## 2013-05-24 LAB — POCT INR: INR: 3.1

## 2013-06-21 ENCOUNTER — Ambulatory Visit (INDEPENDENT_AMBULATORY_CARE_PROVIDER_SITE_OTHER): Payer: Commercial Managed Care - HMO | Admitting: *Deleted

## 2013-06-21 DIAGNOSIS — Z7901 Long term (current) use of anticoagulants: Secondary | ICD-10-CM | POA: Diagnosis not present

## 2013-06-21 DIAGNOSIS — Z5181 Encounter for therapeutic drug level monitoring: Secondary | ICD-10-CM

## 2013-06-21 DIAGNOSIS — I4891 Unspecified atrial fibrillation: Secondary | ICD-10-CM

## 2013-06-21 LAB — POCT INR: INR: 3

## 2013-07-19 ENCOUNTER — Ambulatory Visit (INDEPENDENT_AMBULATORY_CARE_PROVIDER_SITE_OTHER): Payer: Medicare HMO | Admitting: *Deleted

## 2013-07-19 DIAGNOSIS — Z5181 Encounter for therapeutic drug level monitoring: Secondary | ICD-10-CM

## 2013-07-19 DIAGNOSIS — Z7901 Long term (current) use of anticoagulants: Secondary | ICD-10-CM

## 2013-07-19 DIAGNOSIS — I4891 Unspecified atrial fibrillation: Secondary | ICD-10-CM

## 2013-07-19 LAB — POCT INR: INR: 2.7

## 2013-07-30 ENCOUNTER — Other Ambulatory Visit: Payer: Self-pay | Admitting: Cardiology

## 2013-08-30 ENCOUNTER — Ambulatory Visit (INDEPENDENT_AMBULATORY_CARE_PROVIDER_SITE_OTHER): Payer: Medicare HMO | Admitting: *Deleted

## 2013-08-30 DIAGNOSIS — Z5181 Encounter for therapeutic drug level monitoring: Secondary | ICD-10-CM

## 2013-08-30 DIAGNOSIS — I4891 Unspecified atrial fibrillation: Secondary | ICD-10-CM

## 2013-08-30 DIAGNOSIS — Z7901 Long term (current) use of anticoagulants: Secondary | ICD-10-CM

## 2013-08-30 LAB — POCT INR: INR: 3.2

## 2013-09-27 ENCOUNTER — Ambulatory Visit (INDEPENDENT_AMBULATORY_CARE_PROVIDER_SITE_OTHER): Payer: Commercial Managed Care - HMO | Admitting: *Deleted

## 2013-09-27 DIAGNOSIS — Z7901 Long term (current) use of anticoagulants: Secondary | ICD-10-CM

## 2013-09-27 DIAGNOSIS — Z5181 Encounter for therapeutic drug level monitoring: Secondary | ICD-10-CM

## 2013-09-27 DIAGNOSIS — I4891 Unspecified atrial fibrillation: Secondary | ICD-10-CM

## 2013-09-27 LAB — POCT INR: INR: 4.4

## 2013-10-03 ENCOUNTER — Other Ambulatory Visit: Payer: Self-pay | Admitting: Cardiology

## 2013-10-11 ENCOUNTER — Ambulatory Visit (INDEPENDENT_AMBULATORY_CARE_PROVIDER_SITE_OTHER): Payer: Commercial Managed Care - HMO | Admitting: *Deleted

## 2013-10-11 DIAGNOSIS — I4891 Unspecified atrial fibrillation: Secondary | ICD-10-CM | POA: Diagnosis not present

## 2013-10-11 DIAGNOSIS — Z7901 Long term (current) use of anticoagulants: Secondary | ICD-10-CM

## 2013-10-11 DIAGNOSIS — Z5181 Encounter for therapeutic drug level monitoring: Secondary | ICD-10-CM | POA: Diagnosis not present

## 2013-10-11 LAB — POCT INR: INR: 2.1

## 2013-10-12 ENCOUNTER — Telehealth: Payer: Self-pay | Admitting: Internal Medicine

## 2013-10-12 ENCOUNTER — Inpatient Hospital Stay (HOSPITAL_COMMUNITY)
Admission: AD | Admit: 2013-10-12 | Discharge: 2013-10-20 | DRG: 673 | Disposition: A | Discharge status: Home / Self Care | Payer: Medicare HMO | Source: Other Acute Inpatient Hospital | Attending: Internal Medicine | Admitting: Internal Medicine

## 2013-10-12 ENCOUNTER — Inpatient Hospital Stay (HOSPITAL_COMMUNITY): Payer: Medicare HMO

## 2013-10-12 DIAGNOSIS — E876 Hypokalemia: Secondary | ICD-10-CM | POA: Diagnosis present

## 2013-10-12 DIAGNOSIS — T45515A Adverse effect of anticoagulants, initial encounter: Secondary | ICD-10-CM | POA: Diagnosis present

## 2013-10-12 DIAGNOSIS — E782 Mixed hyperlipidemia: Secondary | ICD-10-CM | POA: Diagnosis present

## 2013-10-12 DIAGNOSIS — N189 Chronic kidney disease, unspecified: Secondary | ICD-10-CM

## 2013-10-12 DIAGNOSIS — N184 Chronic kidney disease, stage 4 (severe): Secondary | ICD-10-CM

## 2013-10-12 DIAGNOSIS — G9340 Encephalopathy, unspecified: Secondary | ICD-10-CM | POA: Diagnosis present

## 2013-10-12 DIAGNOSIS — Z794 Long term (current) use of insulin: Secondary | ICD-10-CM | POA: Diagnosis not present

## 2013-10-12 DIAGNOSIS — I498 Other specified cardiac arrhythmias: Secondary | ICD-10-CM | POA: Diagnosis present

## 2013-10-12 DIAGNOSIS — E875 Hyperkalemia: Secondary | ICD-10-CM | POA: Diagnosis present

## 2013-10-12 DIAGNOSIS — I4891 Unspecified atrial fibrillation: Secondary | ICD-10-CM | POA: Diagnosis present

## 2013-10-12 DIAGNOSIS — N179 Acute kidney failure, unspecified: Principal | ICD-10-CM | POA: Diagnosis present

## 2013-10-12 DIAGNOSIS — E1129 Type 2 diabetes mellitus with other diabetic kidney complication: Secondary | ICD-10-CM | POA: Diagnosis present

## 2013-10-12 DIAGNOSIS — Z8249 Family history of ischemic heart disease and other diseases of the circulatory system: Secondary | ICD-10-CM

## 2013-10-12 DIAGNOSIS — Z7901 Long term (current) use of anticoagulants: Secondary | ICD-10-CM | POA: Diagnosis not present

## 2013-10-12 DIAGNOSIS — Z833 Family history of diabetes mellitus: Secondary | ICD-10-CM | POA: Diagnosis not present

## 2013-10-12 DIAGNOSIS — R791 Abnormal coagulation profile: Secondary | ICD-10-CM | POA: Diagnosis present

## 2013-10-12 DIAGNOSIS — R6520 Severe sepsis without septic shock: Secondary | ICD-10-CM

## 2013-10-12 DIAGNOSIS — E1169 Type 2 diabetes mellitus with other specified complication: Secondary | ICD-10-CM | POA: Diagnosis present

## 2013-10-12 DIAGNOSIS — N185 Chronic kidney disease, stage 5: Secondary | ICD-10-CM | POA: Diagnosis present

## 2013-10-12 DIAGNOSIS — I369 Nonrheumatic tricuspid valve disorder, unspecified: Secondary | ICD-10-CM | POA: Diagnosis not present

## 2013-10-12 DIAGNOSIS — G4733 Obstructive sleep apnea (adult) (pediatric): Secondary | ICD-10-CM | POA: Diagnosis present

## 2013-10-12 DIAGNOSIS — J9601 Acute respiratory failure with hypoxia: Secondary | ICD-10-CM

## 2013-10-12 DIAGNOSIS — I120 Hypertensive chronic kidney disease with stage 5 chronic kidney disease or end stage renal disease: Secondary | ICD-10-CM | POA: Diagnosis present

## 2013-10-12 DIAGNOSIS — E872 Acidosis, unspecified: Secondary | ICD-10-CM | POA: Diagnosis present

## 2013-10-12 DIAGNOSIS — J9600 Acute respiratory failure, unspecified whether with hypoxia or hypercapnia: Secondary | ICD-10-CM | POA: Diagnosis present

## 2013-10-12 DIAGNOSIS — Z87891 Personal history of nicotine dependence: Secondary | ICD-10-CM

## 2013-10-12 DIAGNOSIS — A419 Sepsis, unspecified organism: Secondary | ICD-10-CM | POA: Diagnosis not present

## 2013-10-12 DIAGNOSIS — I428 Other cardiomyopathies: Secondary | ICD-10-CM | POA: Diagnosis present

## 2013-10-12 DIAGNOSIS — I480 Paroxysmal atrial fibrillation: Secondary | ICD-10-CM

## 2013-10-12 DIAGNOSIS — N039 Chronic nephritic syndrome with unspecified morphologic changes: Secondary | ICD-10-CM

## 2013-10-12 DIAGNOSIS — R0989 Other specified symptoms and signs involving the circulatory and respiratory systems: Secondary | ICD-10-CM | POA: Diagnosis present

## 2013-10-12 DIAGNOSIS — R0609 Other forms of dyspnea: Secondary | ICD-10-CM | POA: Diagnosis present

## 2013-10-12 DIAGNOSIS — D631 Anemia in chronic kidney disease: Secondary | ICD-10-CM | POA: Diagnosis present

## 2013-10-12 DIAGNOSIS — R001 Bradycardia, unspecified: Secondary | ICD-10-CM

## 2013-10-12 DIAGNOSIS — R188 Other ascites: Secondary | ICD-10-CM | POA: Diagnosis present

## 2013-10-12 DIAGNOSIS — I10 Essential (primary) hypertension: Secondary | ICD-10-CM

## 2013-10-12 DIAGNOSIS — I959 Hypotension, unspecified: Secondary | ICD-10-CM | POA: Diagnosis present

## 2013-10-12 DIAGNOSIS — I482 Chronic atrial fibrillation, unspecified: Secondary | ICD-10-CM

## 2013-10-12 DIAGNOSIS — Z905 Acquired absence of kidney: Secondary | ICD-10-CM | POA: Diagnosis not present

## 2013-10-12 DIAGNOSIS — Z85528 Personal history of other malignant neoplasm of kidney: Secondary | ICD-10-CM

## 2013-10-12 DIAGNOSIS — J9602 Acute respiratory failure with hypercapnia: Secondary | ICD-10-CM

## 2013-10-12 LAB — COMPREHENSIVE METABOLIC PANEL
ALT: 54 U/L — ABNORMAL HIGH (ref 0–53)
AST: 77 U/L — ABNORMAL HIGH (ref 0–37)
Albumin: 2.7 g/dL — ABNORMAL LOW (ref 3.5–5.2)
Alkaline Phosphatase: 37 U/L — ABNORMAL LOW (ref 39–117)
Anion gap: 16 — ABNORMAL HIGH (ref 5–15)
BUN: 80 mg/dL — ABNORMAL HIGH (ref 6–23)
CALCIUM: 9.4 mg/dL (ref 8.4–10.5)
CO2: 22 meq/L (ref 19–32)
CREATININE: 6.9 mg/dL — AB (ref 0.50–1.35)
Chloride: 104 mEq/L (ref 96–112)
GFR calc non Af Amer: 8 mL/min — ABNORMAL LOW (ref >90)
GFR, EST AFRICAN AMERICAN: 9 mL/min — AB (ref >90)
Glucose, Bld: 93 mg/dL (ref 70–99)
Potassium: 5.1 mEq/L (ref 3.7–5.3)
Sodium: 142 mEq/L (ref 137–147)
TOTAL PROTEIN: 7.1 g/dL (ref 6.0–8.3)
Total Bilirubin: 1.1 mg/dL (ref 0.3–1.2)

## 2013-10-12 LAB — CBC WITH DIFFERENTIAL/PLATELET
BASOS PCT: 0 % (ref 0–1)
BASOS PCT: 0 % (ref 0–1)
Basophils Absolute: 0 10*3/uL (ref 0.0–0.1)
Basophils Absolute: 0 10*3/uL (ref 0.0–0.1)
EOS ABS: 0 10*3/uL (ref 0.0–0.7)
EOS ABS: 0 10*3/uL (ref 0.0–0.7)
Eosinophils Relative: 1 % (ref 0–5)
Eosinophils Relative: 1 % (ref 0–5)
HCT: 34.8 % — ABNORMAL LOW (ref 39.0–52.0)
HCT: 33.2 % — ABNORMAL LOW (ref 39.0–52.0)
Hemoglobin: 11.3 g/dL — ABNORMAL LOW (ref 13.0–17.0)
Hemoglobin: 10.9 g/dL — ABNORMAL LOW (ref 13.0–17.0)
LYMPHS ABS: 1.1 10*3/uL (ref 0.7–4.0)
Lymphocytes Relative: 16 % (ref 12–46)
Lymphocytes Relative: 21 % (ref 12–46)
Lymphs Abs: 0.8 10*3/uL (ref 0.7–4.0)
MCH: 29.4 pg (ref 26.0–34.0)
MCH: 29.6 pg (ref 26.0–34.0)
MCHC: 32.5 g/dL (ref 30.0–36.0)
MCHC: 32.8 g/dL (ref 30.0–36.0)
MCV: 90.4 fL (ref 78.0–100.0)
MCV: 90.2 fL (ref 78.0–100.0)
Monocytes Absolute: 0.7 10*3/uL (ref 0.1–1.0)
Monocytes Absolute: 0.3 10*3/uL (ref 0.1–1.0)
Monocytes Relative: 15 % — ABNORMAL HIGH (ref 3–12)
Monocytes Relative: 6 % (ref 3–12)
NEUTROS PCT: 68 % (ref 43–77)
NEUTROS PCT: 72 % (ref 43–77)
Neutro Abs: 3.4 10*3/uL (ref 1.7–7.7)
Neutro Abs: 4 10*3/uL (ref 1.7–7.7)
PLATELETS: 132 10*3/uL — AB (ref 150–400)
Platelets: 138 10*3/uL — ABNORMAL LOW (ref 150–400)
RBC: 3.85 MIL/uL — ABNORMAL LOW (ref 4.22–5.81)
RBC: 3.68 MIL/uL — AB (ref 4.22–5.81)
RDW: 16.7 % — ABNORMAL HIGH (ref 11.5–15.5)
RDW: 16.6 % — ABNORMAL HIGH (ref 11.5–15.5)
WBC: 5 10*3/uL (ref 4.0–10.5)
WBC: 5.5 10*3/uL (ref 4.0–10.5)

## 2013-10-12 LAB — BLOOD GAS, ARTERIAL
Acid-base deficit: 3.9 mmol/L — ABNORMAL HIGH (ref 0.0–2.0)
Bicarbonate: 20.4 mEq/L (ref 20.0–24.0)
DRAWN BY: 31101
Expiratory PAP: 6
FIO2: 0.5 %
INSPIRATORY PAP: 14
MODE: POSITIVE
O2 Saturation: 99.1 %
PATIENT TEMPERATURE: 94.4
PCO2 ART: 32.2 mmHg — AB (ref 35.0–45.0)
TCO2: 21.5 mmol/L (ref 0–100)
pH, Arterial: 7.405 (ref 7.350–7.450)
pO2, Arterial: 111 mmHg — ABNORMAL HIGH (ref 80.0–100.0)

## 2013-10-12 LAB — PHOSPHORUS: PHOSPHORUS: 5.5 mg/dL — AB (ref 2.3–4.6)

## 2013-10-12 LAB — BASIC METABOLIC PANEL
ANION GAP: 17 — AB (ref 5–15)
ANION GAP: 16 — AB (ref 5–15)
ANION GAP: 17 — AB (ref 5–15)
BUN: 77 mg/dL — ABNORMAL HIGH (ref 6–23)
BUN: 75 mg/dL — ABNORMAL HIGH (ref 6–23)
BUN: 70 mg/dL — ABNORMAL HIGH (ref 6–23)
CALCIUM: 8.1 mg/dL — AB (ref 8.4–10.5)
CO2: 24 mEq/L (ref 19–32)
CO2: 23 mEq/L (ref 19–32)
CO2: 22 mEq/L (ref 19–32)
Calcium: 9.1 mg/dL (ref 8.4–10.5)
Calcium: 9 mg/dL (ref 8.4–10.5)
Chloride: 103 mEq/L (ref 96–112)
Chloride: 105 mEq/L (ref 96–112)
Chloride: 102 mEq/L (ref 96–112)
Creatinine, Ser: 6.44 mg/dL — ABNORMAL HIGH (ref 0.50–1.35)
Creatinine, Ser: 6.28 mg/dL — ABNORMAL HIGH (ref 0.50–1.35)
Creatinine, Ser: 5.74 mg/dL — ABNORMAL HIGH (ref 0.50–1.35)
GFR calc Af Amer: 10 mL/min — ABNORMAL LOW (ref >90)
GFR calc Af Amer: 10 mL/min — ABNORMAL LOW (ref >90)
GFR calc non Af Amer: 10 mL/min — ABNORMAL LOW (ref >90)
GFR, EST AFRICAN AMERICAN: 11 mL/min — AB (ref >90)
GFR, EST NON AFRICAN AMERICAN: 8 mL/min — AB (ref >90)
GFR, EST NON AFRICAN AMERICAN: 9 mL/min — AB (ref >90)
GLUCOSE: 56 mg/dL — AB (ref 70–99)
Glucose, Bld: 63 mg/dL — ABNORMAL LOW (ref 70–99)
Glucose, Bld: 351 mg/dL — ABNORMAL HIGH (ref 70–99)
POTASSIUM: 5 meq/L (ref 3.7–5.3)
POTASSIUM: 3.5 meq/L — AB (ref 3.7–5.3)
Potassium: 4 mEq/L (ref 3.7–5.3)
SODIUM: 144 meq/L (ref 137–147)
SODIUM: 144 meq/L (ref 137–147)
SODIUM: 141 meq/L (ref 137–147)

## 2013-10-12 LAB — GLUCOSE, CAPILLARY
GLUCOSE-CAPILLARY: 97 mg/dL (ref 70–99)
GLUCOSE-CAPILLARY: 53 mg/dL — AB (ref 70–99)
Glucose-Capillary: 91 mg/dL (ref 70–99)
Glucose-Capillary: 85 mg/dL (ref 70–99)
Glucose-Capillary: 98 mg/dL (ref 70–99)
Glucose-Capillary: 51 mg/dL — ABNORMAL LOW (ref 70–99)
Glucose-Capillary: 59 mg/dL — ABNORMAL LOW (ref 70–99)
Glucose-Capillary: 140 mg/dL — ABNORMAL HIGH (ref 70–99)

## 2013-10-12 LAB — URINALYSIS, ROUTINE W REFLEX MICROSCOPIC
Bilirubin Urine: NEGATIVE
Glucose, UA: NEGATIVE mg/dL
KETONES UR: NEGATIVE mg/dL
Leukocytes, UA: NEGATIVE
NITRITE: NEGATIVE
PH: 8 (ref 5.0–8.0)
PROTEIN: 30 mg/dL — AB
Specific Gravity, Urine: 1.015 (ref 1.005–1.030)
Urobilinogen, UA: 1 mg/dL (ref 0.0–1.0)

## 2013-10-12 LAB — PROTIME-INR
INR: 2.38 — ABNORMAL HIGH (ref 0.00–1.49)
PROTHROMBIN TIME: 26 s — AB (ref 11.6–15.2)

## 2013-10-12 LAB — URINE MICROSCOPIC-ADD ON

## 2013-10-12 LAB — PROCALCITONIN
Procalcitonin: 1.35 ng/mL
Procalcitonin: 2.11 ng/mL

## 2013-10-12 LAB — LACTIC ACID, PLASMA: LACTIC ACID, VENOUS: 1.6 mmol/L (ref 0.5–2.2)

## 2013-10-12 LAB — TROPONIN I: Troponin I: <0.3 ng/mL (ref <0.30)

## 2013-10-12 LAB — MRSA PCR SCREENING: MRSA BY PCR: NEGATIVE

## 2013-10-12 LAB — MAGNESIUM: MAGNESIUM: 2.5 mg/dL (ref 1.5–2.5)

## 2013-10-12 MED ORDER — CETYLPYRIDINIUM CHLORIDE 0.05 % MT LIQD
7.0000 mL | Freq: Two times a day (BID) | OROMUCOSAL | Status: DC
  Administered 2013-10-12 – 2013-10-20 (×14): 7 mL via OROMUCOSAL

## 2013-10-12 MED ORDER — WARFARIN - PHARMACIST DOSING INPATIENT: Freq: Every day | Status: DC

## 2013-10-12 MED ORDER — DEXTROSE 50 % IV SOLN
INTRAVENOUS | Status: AC
  Filled 2013-10-12: qty 50

## 2013-10-12 MED ORDER — HEPARIN SODIUM (PORCINE) 5000 UNIT/ML IJ SOLN: 5000.0000 [IU] | Freq: Three times a day (TID) | INTRAMUSCULAR | Status: DC

## 2013-10-12 MED ORDER — CIPROFLOXACIN IN D5W 400 MG/200ML IV SOLN
400.0000 mg | Freq: Once | INTRAVENOUS | Status: AC
  Administered 2013-10-12: 400 mg via INTRAVENOUS
  Filled 2013-10-12: qty 200

## 2013-10-12 MED ORDER — PANTOPRAZOLE SODIUM 40 MG IV SOLR
40.0000 mg | INTRAVENOUS | Status: DC
  Administered 2013-10-12: 40 mg via INTRAVENOUS
  Filled 2013-10-12 (×2): qty 40

## 2013-10-12 MED ORDER — WARFARIN 1.25 MG HALF TABLET
1.2500 mg | ORAL_TABLET | ORAL | Status: DC
  Administered 2013-10-12: 1.25 mg via ORAL
  Filled 2013-10-12: qty 1

## 2013-10-12 MED ORDER — SODIUM CHLORIDE 0.9 % IV SOLN
1.0000 g | Freq: Once | INTRAVENOUS | Status: AC
  Administered 2013-10-12: 1 g via INTRAVENOUS
  Filled 2013-10-12: qty 10

## 2013-10-12 MED ORDER — DEXTROSE 50 % IV SOLN
50.0000 mL | Freq: Once | INTRAVENOUS | Status: AC | PRN
  Administered 2013-10-12: 50 mL via INTRAVENOUS

## 2013-10-12 MED ORDER — SODIUM CHLORIDE 0.9 % IV SOLN: 250.0000 mL | INTRAVENOUS | Status: DC | PRN

## 2013-10-12 MED ORDER — STERILE WATER FOR INJECTION IV SOLN
INTRAVENOUS | Status: DC
  Administered 2013-10-12: 04:00:00 via INTRAVENOUS
  Filled 2013-10-12 (×4): qty 850

## 2013-10-12 MED ORDER — METRONIDAZOLE IN NACL 5-0.79 MG/ML-% IV SOLN
500.0000 mg | Freq: Three times a day (TID) | INTRAVENOUS | Status: DC
  Administered 2013-10-12 – 2013-10-14 (×7): 500 mg via INTRAVENOUS
  Filled 2013-10-12 (×8): qty 100

## 2013-10-12 MED ORDER — DOPAMINE-DEXTROSE 3.2-5 MG/ML-% IV SOLN
2.0000 ug/kg/min | INTRAVENOUS | Status: DC
  Administered 2013-10-12: 2 ug/kg/min via INTRAVENOUS

## 2013-10-12 MED ORDER — VANCOMYCIN HCL 10 G IV SOLR
2500.0000 mg | Freq: Once | INTRAVENOUS | Status: AC
  Administered 2013-10-12: 2500 mg via INTRAVENOUS
  Filled 2013-10-12: qty 2500

## 2013-10-12 MED ORDER — WARFARIN SODIUM 2.5 MG PO TABS
2.5000 mg | ORAL_TABLET | ORAL | Status: DC
  Filled 2013-10-12: qty 1

## 2013-10-12 MED ORDER — SODIUM POLYSTYRENE SULFONATE 15 GM/60ML PO SUSP
30.0000 g | Freq: Once | ORAL | Status: AC
  Administered 2013-10-12: 30 g via ORAL
  Filled 2013-10-12: qty 120

## 2013-10-12 MED ORDER — INSULIN ASPART 100 UNIT/ML ~~LOC~~ SOLN: 2.0000 [IU] | SUBCUTANEOUS | Status: DC

## 2013-10-12 MED ORDER — DEXTROSE 5 % IV SOLN
2.0000 g | Freq: Once | INTRAVENOUS | Status: AC
  Administered 2013-10-12: 2 g via INTRAVENOUS
  Filled 2013-10-12: qty 2

## 2013-10-12 MED ORDER — SODIUM BICARBONATE 8.4 % IV SOLN
50.0000 meq | Freq: Once | INTRAVENOUS | Status: AC
  Administered 2013-10-12: 50 meq via INTRAVENOUS
  Filled 2013-10-12: qty 50

## 2013-10-12 MED ORDER — HYDRALAZINE HCL 20 MG/ML IJ SOLN
10.0000 mg | INTRAMUSCULAR | Status: DC | PRN
  Administered 2013-10-12 – 2013-10-17 (×5): 10 mg via INTRAVENOUS
  Filled 2013-10-12 (×5): qty 1

## 2013-10-12 MED ORDER — CIPROFLOXACIN IN D5W 400 MG/200ML IV SOLN
400.0000 mg | Freq: Once | INTRAVENOUS | Status: DC
  Filled 2013-10-12: qty 200

## 2013-10-12 MED FILL — Sodium Bicarbonate IV Soln 8.4%: INTRAVENOUS | Qty: 50 | Status: AC

## 2013-10-12 MED FILL — Dopamine in Dextrose 5% Inj 3.2 MG/ML: INTRAVENOUS | Qty: 250 | Status: AC

## 2013-10-12 NOTE — Telephone Encounter (Signed)
Call from Dr Charlton Haws ER to [facility]   Patient called EMS wit dyspnea. On arrival bradycardic to 30 and needed to be TC paced In ER c/o dyspnea but no chest pain  Labs  - metab and resp acidosis with 7.1/50/87 - started Bipap - hypotensive and bradycardic - started on dopamine and TC pacing   Labs  0 creat 7 (was 3 in 2009 at [facility]) and is sp nephrectomy  K 5.8  A Unclear shock and bradycardia Acute on chronicc renal failure Acidosis  P Accepted to [facility] - eta 2h per care [facility]  Dr. Brand Males, M.D., [facility].C.P Pulmonary and Critical Care Medicine Staff Physician [facility] Pager: [phone], If no answer or between  15:00h - 7:00h: call [phone]  10/12/2013 12:29 AM

## 2013-10-12 NOTE — Progress Notes (Signed)
Placed patient on CPAP for the night with minimum pressure set at 6cm and maximum pressure set at 20cm. Oxygen set at 3lpm with Sp02=98%

## 2013-10-12 NOTE — Consult Note (Signed)
CONSULT NOTE   MRN : [id number]  Reason for Consult: Fistula stenosis  Referring Physician:   History of Present Illness: 58 y/o male with CKD CR 6.90.  Reported to have fistula stenosis. Placement of new left upper arm arteriovenous fistula in 2009 by Dr. [name] and has never been on dialysis.  He was admitted secondary to dyspnea and bradycardia in the 30's.  His past medical history includes:   dyslipidemia  Treated with Pravachol, hypertension treated with Carvedilol, DM treated with insulin and A-fib currently on coumadin.  INR is 2.38 today.       Current Facility-Administered Medications  Medication Dose Route Frequency Provider Last Rate Last Dose  . 0.9 %  sodium chloride infusion  250 mL Intravenous PRN [name], MD      . ciprofloxacin (CIPRO) IVPB 400 mg  400 mg Intravenous Once Raylene Miyamoto, MD      . DOPamine (INTROPIN) 800 mg in dextrose 5 % 250 mL (3.2 mg/mL) infusion  2-20 mcg/kg/min Intravenous Continuous Corey Harold, NP   2 mcg/kg/min at 10/12/13 0543  . hydrALAZINE (APRESOLINE) injection 10 mg  10 mg Intravenous Q4H PRN Raylene Miyamoto, MD      . insulin aspart (novoLOG) injection 2-6 Units  2-6 Units Subcutaneous 6 times per day Corey Harold, NP      . metroNIDAZOLE (FLAGYL) IVPB 500 mg  500 mg Intravenous Q8H Raylene Miyamoto, MD   500 mg at 10/12/13 1210  . sodium bicarbonate 150 mEq in sterile water 1,000 mL infusion   Intravenous Continuous Corey Harold, NP 100 mL/hr at 10/12/13 0403    . warfarin (COUMADIN) tablet 1.25 mg  1.25 mg Oral Q M,W,F-1800 Narda Bonds, RPH      . [START ON 10/13/2013] warfarin (COUMADIN) tablet 2.5 mg  2.5 mg Oral Q T,Th,S,Su-1800 Narda Bonds, [facility]      . Warfarin - Pharmacist Dosing Inpatient   Does not apply q1800 Narda Bonds, [place]        Pt meds include: Statin :Yes Betablocker: No ASA: Yes Other anticoagulants/antiplatelets: Coumadin  Past Medical History  Diagnosis Date  . Renal cell cancer     . Mixed hyperlipidemia   . Type 2 diabetes mellitus   . Paroxysmal atrial fibrillation   . Essential hypertension, benign   . Chronic renal insufficiency     Never required dialysis - Dr. Lowanda Foster  . Gout   . Diverticulosis of colon   . Colonic polyp   . Cardiomyopathy     LVEF 50-55% 8/11 - [facility]  . OSA (obstructive sleep apnea)     CPAP    Past Surgical History  Procedure Laterality Date  . Right nephrectomy    . Left arm av fistula  2009    Dr. [name]    Social History History  Substance Use Topics  . Smoking status: Former Smoker    Types: Cigarettes  . Smokeless tobacco: Never Used  . Alcohol Use: Yes     Comment: Occasional    Family History Family History  Problem Relation Age of Onset  . Hypertension      Siblings  . Diabetes type II      Siblings    No Known Allergies   REVIEW OF SYSTEMS  General: [ ]  Weight loss, [ ]  Fever, [ ]  chills Neurologic: [ ]  Dizziness, [ ]  Blackouts, [ ]  Seizure [ ]  Stroke, [ ]  "Mini stroke", [ ]  Slurred speech, [ ]   Temporary blindness; [ ]  weakness in arms or legs, [ ]  Hoarseness [ ]  Dysphagia Cardiac: [ ]  Chest pain/pressure, [x ] Shortness of breath at rest [ ]  Shortness of breath with exertion, [ ]  Atrial fibrillation or irregular heartbeat  Vascular: [ ]  Pain in legs with walking, [ ]  Pain in legs at rest, [ ]  Pain in legs at night,  [ ]  Non-healing ulcer, [ ]  Blood clot in vein/DVT,   Pulmonary: [ ]  Home oxygen, [ ]  Productive cough, [ ]  Coughing up blood, [ ]  Asthma,  [ ]  Wheezing [ ]  COPD Musculoskeletal:  [ ]  Arthritis, [ ]  Low back pain, [ ]  Joint pain Hematologic: [ ]  Easy Bruising, [ ]  Anemia; [ ]  Hepatitis Gastrointestinal: [ ]  Blood in stool, [ ]  Gastroesophageal Reflux/heartburn, Urinary: [ x] chronic Kidney disease, [ ]  on HD - [ ]  MWF or [ ]  TTHS, [ ]  Burning with urination, [ ]  Difficulty urinating Skin: [ ]  Rashes, [ ]  Wounds Psychological: [ ]  Anxiety, [ ]  Depression  Physical Examination Filed  Vitals:   10/12/13 0800 10/12/13 0900 10/12/13 0935 10/12/13 1000  BP: 151/85 152/79  163/91  Pulse: 65 77 73 87  Temp:      TempSrc:      Resp: 15 14 19 19   Height:      Weight:      SpO2: 100% 100% 100% 100%   Body mass index is 34.49 kg/(m^2).  General:  WDWN in NAD HENT: WNL Eyes: Pupils equal Pulmonary: normal non-labored breathing , without Rales, rhonchi,  wheezing Cardiac: irregularly irregular, without  Murmurs, rubs or gallops; Abdomen: soft, NT, no masses Skin: no rashes, ulcers noted;  no Gangrene , no cellulitis; no open wounds;   Vascular Exam/Pulses:Palpable thrill in the left upper arm distal 1/2 of the fistula, tortuous  proximal fistula with decreased thrill.  Palpable radial pulse   Musculoskeletal: no muscle wasting or atrophy;  Neurologic: A&O X 3; Appropriate Affect ;  SENSATION: normal; MOTOR FUNCTION: 5/5 Symmetric Speech is fluent/normal   Significant Diagnostic Studies: CBC Lab Results  Component Value Date   WBC 5.0 10/12/2013   HGB 11.3* 10/12/2013   HCT 34.8* 10/12/2013   MCV 90.4 10/12/2013   PLT 138* 10/12/2013    BMET    Component Value Date/Time   NA 142 10/12/2013 0448   K 5.1 10/12/2013 0448   CL 104 10/12/2013 0448   CO2 22 10/12/2013 0448   GLUCOSE 93 10/12/2013 0448   BUN 80* 10/12/2013 0448   CREATININE 6.90* 10/12/2013 0448   CALCIUM 9.4 10/12/2013 0448   CALCIUM 9.0 03/21/2007 0415   GFRNONAA 8* 10/12/2013 0448   GFRAA 9* 10/12/2013 0448   Estimated Creatinine Clearance: 16 ml/min (by C-G formula based on Cr of 6.9).  COAG Lab Results  Component Value Date   INR 2.38* 10/12/2013   INR 2.1 10/11/2013   INR 4.4 09/27/2013       ASSESSMENT/PLAN:  CKD not currently on dialysis Decreased thrill in the left AV fistula Plan Fistulogram Mon. By Dr. Cloyde Reams, EMMA [facility] 10/12/2013 12:15 PM  I agree with the above.  The patient is now nearing the initiation of dialysis.  He has a left upper arm fistula placed in 2009.   The fistula has matured nicely in the distal half of the upper arm.  It appears to be very tortuous in the proximal upper arm.  There is a change in the caliber of the  thrill, in the upper arm.  I have recommended proceeding with a fistulogram to fully evaluate the fistula to see if any revisions need to be performed prior to utilization.  This will be scheduled for Monday.  The patient will need to have an INR less than 2 before proceeding.  Please stop his anticoagulation.  Annamarie Major

## 2013-10-12 NOTE — Progress Notes (Signed)
eLink Physician-Brief Progress Note Patient Name: Kerby Followell DOB: 02-16-1956 MRN: [id number]   Date of Service  10/12/2013  HPI/Events of Note  See my intake note when I took call  Subsequently when [facility] went to pick up patient they thought pcaer could be turned off  Enroute he did have NS VTACH and PVC - ordered bicarb   Now in ICU , HR 55, BP ok, On bipap,  Lying flat. RN at bedside  eICU Interventions  NP and MD to see     Intervention Category Evaluation Type: New Patient Evaluation  Florentina Marquart 10/12/2013, 3:18 AM

## 2013-10-12 NOTE — Consult Note (Signed)
Reason for Consult: AKI on CKD5 Referring Physician: Dr. Clabe Seal Albert [name] is an 58 y.o. male.  HPI: Albert [name] is a 58 year old male with PMH of HTN, DM, Afib on coumadin, cardiomyopathy, OSA, RCC s/p nephrectomy, CKD5 who transferred from [facility] ED to [facility] ICU with bradycardia requiring transcutaneous pacing, metabolic acidosis and renal failure.    The patient reports 2 week hx of diarrhea.  He has DOE at baseline but had acute worsening last night, became diaphoretic and thought he was going to pass out so EMS was called and he was transported to [place].  He denies chest pain but felt "tightness" at the time.  At [facility], he was acidotic (pH 7.14), required BiPAP support, was given atropine for bradycardia and dopamine for hypotension.  Cr was 7.10, eGFR 12, K 5.8.  The patient had fistula placed by Dr. [name] in 2009 but has not needed to initiate HD yet.  Follows with Dr. Lowanda Foster (nephrologist) in [place].  He reports that his most recent Cr, last week, was 5.80.  Nephrology consulted given worsening renal function and possible need to initiate HD this admission.   Trend in Creatinine: Creatinine, Ser  Date/Time Value Ref Range Status  10/12/2013 11:55 AM 6.44* 0.50 - 1.35 mg/dL Final  10/12/2013  4:48 AM 6.90* 0.50 - 1.35 mg/dL Final  06/12/2007 11:51 AM 3.02*  Final  03/22/2007  5:25 AM 3.43*  Final  03/21/2007  4:15 AM 3.38*  Final  03/20/2007  4:15 AM 3.68*  Final  03/19/2007  3:50 AM 3.98*  Final  03/19/2007 12:10 AM 4.18*  Final  03/18/2007  4:10 AM 4.08*  Final  03/17/2007  5:35 AM 4.02*  Final  03/16/2007  6:10 AM 3.63*  Final  03/15/2007  7:45 PM 3.96*  Final  12/13/2006  2:28 PM 2.60*  Final   10/11/2013 - Cr 7.10, BUN 82 (at [facility])  Labs from Dr. Florentina Addison office: 09/27/2013 - Cr 5.80, BUN 70 07/31/2013 - Cr 6.09, BUN 66, GFR 11 05/31/2013 - Cr 5.92, BUN 73, GFR 11 03/29/2013 - Cr 4.95, BUN 58, GFR 14  PMH:   Past Medical History  Diagnosis  Date  . Renal cell cancer   . Mixed hyperlipidemia   . Type 2 diabetes mellitus   . Paroxysmal atrial fibrillation   . Essential hypertension, benign   . Chronic renal insufficiency     Never required dialysis - Dr. Lowanda Foster  . Gout   . Diverticulosis of colon   . Colonic polyp   . Cardiomyopathy     LVEF 50-55% 8/11 - [facility]  . OSA (obstructive sleep apnea)     CPAP    PSH:   Past Surgical History  Procedure Laterality Date  . Right nephrectomy    . Left arm av fistula  2009    Dr. [name]    Allergies: No Known Allergies  Medications:   Prior to Admission medications   Medication Sig Start Date End Date Taking? Authorizing Provider  allopurinol (ZYLOPRIM) 100 MG tablet Take 200 mg by mouth daily.     Historical Provider, MD  calcitRIOL (ROCALTROL) 0.5 MCG capsule Take 0.5 mcg by mouth daily.    Historical Provider, MD  carvedilol (COREG) 25 MG tablet TAKE 1 TABLET TWICE DAILY WITH MEALS 10/03/13   [name], MD  Cholecalciferol (VITAMIN D-3 PO) Take 1,000 Units by mouth daily.    Historical Provider, MD  diltiazem (TIAZAC) 300 MG 24 hr capsule TAKE 1 CAPSULE  EVERY DAY 07/30/13   [name], MD  fenofibrate 160 MG tablet Take 160 mg by mouth daily.    Historical Provider, MD  fish oil-omega-3 fatty acids 1000 MG capsule Take 3 g by mouth daily.     Historical Provider, MD  glipiZIDE (GLUCOTROL) 10 MG tablet Take 10 mg by mouth daily.     Historical Provider, MD  insulin glargine (LANTUS) 100 UNIT/ML injection Inject 20 Units into the skin at bedtime.     Historical Provider, MD  losartan (COZAAR) 100 MG tablet Take 1 tablet (100 mg total) by mouth daily. 11/16/12   [name], MD  pravastatin (PRAVACHOL) 10 MG tablet Take 10 mg by mouth daily.    Historical Provider, MD  sodium bicarbonate 325 MG tablet Take 325 mg by mouth 2 (two) times daily.    Historical Provider, MD  warfarin (COUMADIN) 2.5 MG tablet Take 1 tablet daily except 1/2 tablet on Saturdays or  as directed 04/26/13   [name], MD    Inpatient medications: . ciprofloxacin  400 mg Intravenous Once  . insulin aspart  2-6 Units Subcutaneous 6 times per day  . metronidazole  500 mg Intravenous Q8H  . sodium polystyrene  30 g Oral Once  . warfarin  1.25 mg Oral Q M,W,F-1800  . [START ON 10/13/2013] warfarin  2.5 mg Oral Q T,Th,S,Su-1800  . Warfarin - Pharmacist Dosing Inpatient   Does not apply q1800    Discontinued Meds:   Medications Discontinued During This Encounter  Medication Reason  . heparin injection 5,000 Units   . pantoprazole (PROTONIX) injection 40 mg Discontinued by provider    Social History:  reports that he has quit smoking. His smoking use included Cigarettes. He smoked 0.00 packs per day. He has never used smokeless tobacco. He reports that he drinks alcohol. He reports that he does not use illicit drugs.  Family History:   Family History  Problem Relation Age of Onset  . Hypertension      Siblings  . Diabetes type II      Siblings    General:  Increased fatigue, denies fever/chills, good appetite HEENT:  Denies change in cough, sneezing, sore throat, change in vision or hearing Cardiac:  + chest tightness last night, + palpitations, + LE swelling last week, + 3 pillow orthopnea and PND (unchanged from baseline) Pulm:  Per HPI GI:  Per HPI, denies N/V, abdominal pain or blood in stool GU:  Decreased UOP in recent months, urine more foamy, denies dysuria or hematuria Neuro:  No weakness, able to ambulate without difficulty  Weight change:   Intake/Output Summary (Last 24 hours) at 10/12/13 1152 Last data filed at 10/12/13 1000  Gross per 24 hour  Intake 1186.3 ml  Output    680 ml  Net  506.3 ml   BP 163/91  Pulse 87  Temp(Src) 97.4 F (36.3 C) (Oral)  Resp 19  Ht _0  (1.88 m)  Wt 121.9 kg (268 lb 11.9 oz)  BMI 34.49 kg/m2  SpO2 100% Filed Vitals:   10/12/13 0800 10/12/13 0900 10/12/13 0935 10/12/13 1000  BP: 151/85 152/79   163/91  Pulse: 65 77 73 87  Temp:      TempSrc:      Resp: _1 Height:      Weight:      SpO2: 100% 100% 100% 100%     General: resting in bed in NAD, empty lunch tray at bedside  HEENT: EOMI, no scleral icterus, oropharynx clear Cardiac: irregularly irregular, no rubs, murmurs or gallops, LUE AV fistula + thrill (but decreased) Pulm: clear to auscultation bilaterally, moving normal volumes of air Abd: soft, nontender, BS present, some distention GU:  Foley bag with 400cc yellow urine Ext: warm and well perfused, 1+ B/L lower extremity edema, distal pulses palpable Neuro: alert and oriented X3, cranial nerves II-XII grossly intact, upper and lower extremity strength and sensation intact  Labs: Basic Metabolic Panel:  Recent Labs Lab 10/12/13 0448 10/12/13 1155  NA 142 144  K 5.1 5.0  CL 104 103  CO2 22 24  GLUCOSE 93 56*  BUN 80* 77*  CREATININE 6.90* 6.44*  ALBUMIN 2.7*  --   CALCIUM 9.4 9.1  PHOS 5.5*  --    Liver Function Tests:  Recent Labs Lab 10/12/13 0448  AST 77*  ALT 54*  ALKPHOS 37*  BILITOT 1.1  PROT 7.1  ALBUMIN 2.7*   CBC:  Recent Labs Lab 10/12/13 0448 10/12/13 1155  WBC 5.0 5.5  NEUTROABS 3.4 4.0  HGB 11.3* 10.9*  HCT 34.8* 33.2*  MCV 90.4 90.2  PLT 138* 132*   PT/INR: 2.38  Recent Labs Lab 10/12/13 1155  TROPONINI <0.30   CBG:  Recent Labs Lab 10/12/13 0319 10/12/13 0743 10/12/13 1244  GLUCAP 91 85 98   Xrays/Other Studies: Dg Chest Port 1 View  10/12/2013   CLINICAL DATA:  Edema.  EXAM: PORTABLE CHEST - 1 VIEW  COMPARISON:  10/11/2013.  FINDINGS: Mediastinum and hilar structures are unremarkable. Cardiomegaly with mild pulmonary vascular prominence. Mild interstitial prominence. Mild congestive heart failure cannot be excluded. Mild basilar atelectasis on the right. Pneumonia cannot be excluded. No pneumothorax. No acute bony abnormality  IMPRESSION: 1. Mild congestive heart failure. 2. Right base subsegmental  atelectasis versus mild pneumonia.   Electronically Signed   By: Marcello Moores  Register   On: 10/12/2013 11:45    Assessment/Plan: 1.  AKI on CKD5 - pt reports 2 week hx of diarrhea, recent Lasix use (174m daily on Monday - Wednesday of this week) and continued use of Losartan all of which have contributed to acute worsening of his already poor renal function.  Concern LUE fistula may have some stenosis.  (no urgent indication for dialysis given improving K and Scr as well as UOP, although he does have advanced CKD at baseline but was stable at 5.8 2 weeks ago) - vascular consulted and appreciate their assistance, plan for fistulogram on 08/24. - will continue to monitor renal function, UOP to determine if need for HD  2. Metabolic acidosis, improving - bicarb 24, will d/c bicarb gtt 3. Hyperkalemia - due to worsening renal function.  [facility]icar[facility], insulin, glucose, albuterol, kayexalate.  Also received more calcium gluconate [facility](but has not yet had a BM).  K trending down since arriving overnight, 5.8-->5.1-->5.0.   4. Acute hypercapnic respiratory failure, improving - currently on 4L via [place]; management per PCCM 5. Afib - INR therapeutic; coumadin per pharmacy 6. Bradycardia/hypotension improving - off dopamine gtt, management per PCCM 1. Likely due to A fib and concomitant use of dilt/coreg and mild hyperkalemia and metabolic acidosis. 2. May need further workup by EP if it recurs. 7. Cardiomyopathy - 2D ECHO pending  8. Diarrhea - none since yesterday, however he was treated with kayexalate for elevated K so will likely have some today 9. DM - stable, management per  PCCM 10. HTN - hypotension has resolved and pt now hypertensive; continue to hold Losartan in the setting of AKI.  Primary team has added hydral prn. 11. Anemia of chronic kidney disease - hgb 10.9, near baseline of 11.  Albert Maxin, DO IMTS, PGY2 10/12/2013, 11:52 AM    I have seen and examined this patient and agree with plan as outlined by Dr. Redmond Pulling.  May need to hold coumadin for fistulogram and/or surgery if requires revision of AVF. Albert [name] A,MD 10/12/2013 4:26 PM

## 2013-10-12 NOTE — H&P (Signed)
PULMONARY / CRITICAL CARE MEDICINE   Name: Albert [name] MRN: [id number] DOB: 02-15-1956    ADMISSION DATE:  10/12/2013 CONSULTATION DATE:  10/12/2013  REFERRING MD :  Corena Pilgrim EDP  CHIEF COMPLAINT:  Dyspnea  INITIAL PRESENTATION: 58 year old male presented to [facility] ED 8/20 c/o dyspnea. In ED found to be bradycardic in 30's, atropine given and TC pacing applied. Noted to have worsening renal failure and acidemia. Transferred to [facility] for ICU admission.   STUDIES:   SIGNIFICANT EVENTS: 8/20 admitted to ICU with worsening renal failure    HISTORY OF PRESENT ILLNESS:  58 year old male with PMH as below, which includes CKD s/p renal Ca and R nephrectomy, AF on coumadin, DM, and HTN. He has AV fistula in place but has never required HD. 8/20 he presented to [facility] ED c/o SOB, N/V, fever/chills. In ED he was found to be bradycardic. He was administered atropine and started on TC pacing. BiPAP was started for respiratory distress and acidemia. At one point his HR improved to 90s and pacer pads were removed. He was brought to [facility] ED for ICU admission.   PAST MEDICAL HISTORY :  Past Medical History  Diagnosis Date  . Renal cell cancer   . Mixed hyperlipidemia   . Type 2 diabetes mellitus   . Paroxysmal atrial fibrillation   . Essential hypertension, benign   . Chronic renal insufficiency     Never required dialysis - Dr. Lowanda Foster  . Gout   . Diverticulosis of colon   . Colonic polyp   . Cardiomyopathy     LVEF 50-55% 8/11 - [facility]  . OSA (obstructive sleep apnea)     CPAP   Past Surgical History  Procedure Laterality Date  . Right nephrectomy    . Left arm av fistula  2009    Dr. [name]   Prior to Admission medications   Medication Sig Start Date End Date Taking? Authorizing Provider  allopurinol (ZYLOPRIM) 100 MG tablet Take 200 mg by mouth daily.     Historical Provider, MD  calcitRIOL (ROCALTROL) 0.5 MCG capsule Take 0.5 mcg by mouth daily.    Historical Provider, MD   carvedilol (COREG) 25 MG tablet TAKE 1 TABLET TWICE DAILY WITH MEALS 10/03/13   [name], MD  Cholecalciferol (VITAMIN D-3 PO) Take 1,000 Units by mouth daily.    Historical Provider, MD  diltiazem (TIAZAC) 300 MG 24 hr capsule TAKE 1 CAPSULE EVERY DAY 07/30/13   [name], MD  fenofibrate 160 MG tablet Take 160 mg by mouth daily.    Historical Provider, MD  fish oil-omega-3 fatty acids 1000 MG capsule Take 3 g by mouth daily.     Historical Provider, MD  glipiZIDE (GLUCOTROL) 10 MG tablet Take 10 mg by mouth daily.     Historical Provider, MD  insulin glargine (LANTUS) 100 UNIT/ML injection Inject 20 Units into the skin at bedtime.     Historical Provider, MD  losartan (COZAAR) 100 MG tablet Take 1 tablet (100 mg total) by mouth daily. 11/16/12   [name], MD  pravastatin (PRAVACHOL) 10 MG tablet Take 10 mg by mouth daily.    Historical Provider, MD  sodium bicarbonate 325 MG tablet Take 325 mg by mouth 2 (two) times daily.    Historical Provider, MD  warfarin (COUMADIN) 2.5 MG tablet Take 1 tablet daily except 1/2 tablet on Saturdays or as directed 04/26/13   [name], MD   No Known Allergies  FAMILY HISTORY:  Family History  Problem Relation Age of Onset  . Hypertension      Siblings  . Diabetes type II      Siblings   SOCIAL HISTORY:  reports that he has quit smoking. His smoking use included Cigarettes. He smoked 0.00 packs per day. He has never used smokeless tobacco. He reports that he drinks alcohol. He reports that he does not use illicit drugs.  REVIEW OF SYSTEMS:   Review of Systems:   Bolds are positive  Constitutional: weight loss, gain, night sweats, Fevers, chills, fatigue .  HEENT: headaches, Sore throat, sneezing, nasal congestion, post nasal drip, Difficulty swallowing, Tooth/dental problems, visual complaints visual changes, ear ache CV:  chest pain, radiates: ,Orthopnea, PND, swelling in lower extremities, dizziness, palpitations,  syncope.  GI  heartburn, indigestion, abdominal pain, nausea, vomiting, diarrhea, change in bowel habits, loss of appetite, bloody stools.  Resp: cough, productive: , hemoptysis, dyspnea, chest pain, pleuritic.  Skin: rash or itching or icterus GU: dysuria, change in color of urine, urgency or frequency. flank pain, hematuria  MS: joint pain or swelling. decreased range of motion  Psych: change in mood or affect. depression or anxiety.  Neuro: difficulty with speech, weakness, numbness, ataxia   SUBJECTIVE:   VITAL SIGNS: Temp:  [94.4 F (34.7 C)] 94.4 F (34.7 C) (08/21 0323) FiO2 (%):  [50 %] 50 % (08/21 0314) Weight:  [121.9 kg (268 lb 11.9 oz)] 121.9 kg (268 lb 11.9 oz) (08/21 0404) HEMODYNAMICS:   VENTILATOR SETTINGS: Vent Mode:  [-] BIPAP FiO2 (%):  [50 %] 50 % PEEP:  [6 cmH20] 6 cmH20 Pressure Support:  [8 cmH20] 8 cmH20 INTAKE / OUTPUT:  Intake/Output Summary (Last 24 hours) at 10/12/13 0434 Last data filed at 10/12/13 0400  Gross per 24 hour  Intake      0 ml  Output     30 ml  Net    -30 ml    PHYSICAL EXAMINATION: General:  Obese male in NAD on BiPAP Neuro:  Alert, oriented HEENT:  [place]/AT, PERRL, no JVD noted Cardiovascular:  Irreg Harrold Donath. Lungs:  Clear bilaterally Abdomen:  Soft, mildly tender, non-distended Musculoskeletal:  No acute deformity. Trace BLE edema Skin:  Intact  LABS:  CBC No results found for this basename: WBC, HGB, HCT, PLT,  in the last 168 hours Coag's  Recent Labs Lab 10/11/13 1110  INR 2.1   BMET No results found for this basename: NA, K, CL, CO2, BUN, CREATININE, GLUCOSE,  in the last 168 hours Electrolytes No results found for this basename: CALCIUM, MG, PHOS,  in the last 168 hours Sepsis Markers No results found for this basename: LATICACIDVEN, PROCALCITON, O2SATVEN,  in the last 168 hours ABG  Recent Labs Lab 10/12/13 0352  PHART 7.405  PCO2ART 32.2*  PO2ART 111.0*   Liver Enzymes No results found for  this basename: AST, ALT, ALKPHOS, BILITOT, ALBUMIN,  in the last 168 hours Cardiac Enzymes No results found for this basename: TROPONINI, PROBNP,  in the last 168 hours Glucose No results found for this basename: GLUCAP,  in the last 168 hours  Imaging No results found.   ASSESSMENT / PLAN:  PULMONARY A: Acute hypercarbic respiratory failure  P:   PRN BiPAP Keeps SpO2 > 92%  CARDIOVASCULAR A:  AF with bradycardia Cardiomyopathy H/o HTN  P:  Repeat ECG Atropine at bedside Dopamine gtt 2D Echo Hold preadmission diltiazem, coreg, losartan, statin, fibrate   RENAL A:   Acute on  chronic kidney disease Hyperkalemia - Treated at [facility] Hx or renal ca, R nephrectomy High AG metabolic acidosis  P:   Bicarb 1 amp now then gtt @75 /hr Calcium 1 amp now Consult renal now or in AM depending on response to bicarb. May require urgent HD Repeat Bmet now, then every 6 hours.   GASTROINTESTINAL A:   No acute issues  P:   NPO SUP: IV protonix  HEMATOLOGIC A:   Coumadin coagulopathy  P:  Continue warfarin per pharmacy dosing Follow coags Follow CBC    INFECTIOUS A:   ? Sepsis, doubt  P:   BCx2 8/21 >>> UC 8/21 >>> Abx: Vancomycin, start date 8/21, day 1 Abx: Cefepime, start date 8/21, day 1  ENDOCRINE A:   DM  P:   CBG monitoring  SSI Hold oral DM meds (cozaar)  NEUROLOGIC A:   Acute encephalopathy - drowsy, likely metabolic  P:   RASS goal: 0 Monitor   Georgann Housekeeper, ACNP [place] [facility]/Critical Care Pager [phone] or [phone]  Attending:  I have seen and examined the patient with nurse practitioner/resident and agree with the note above.   I have personally obtained a history, examined the patient, evaluated laboratory and imaging results, formulated the assessment and plan and placed orders. CRITICAL CARE: The patient is critically ill with multiple organ systems failure and requires high complexity decision making  for assessment and support, frequent evaluation and titration of therapies, application of advanced monitoring technologies and extensive interpretation of multiple databases. Critical Care Time devoted to patient care services described in this note is 45 minutes.   Roselie Awkward, MD [place] [facility] Pager: [phone] Cell: [phone] If no response, call [phone]

## 2013-10-12 NOTE — Progress Notes (Signed)
RT Note:  I spoke with pt about CPAP orders. Per pt he wears sometimes at home, as of now he states he does not want to wear a cpap tonight and would rather wear [place]. RT will continue to monitor.

## 2013-10-12 NOTE — Progress Notes (Signed)
  Echocardiogram 2D Echocardiogram has been performed.  Mauricio Po 10/12/2013, 3:58 PM

## 2013-10-12 NOTE — H&P (Addendum)
PULMONARY / CRITICAL CARE MEDICINE   Name: Albert [name] MRN: [id number] DOB: Nov 26, 1955    ADMISSION DATE:  10/12/2013 CONSULTATION DATE:  10/12/2013  REFERRING MD :  Corena Pilgrim EDP  CHIEF COMPLAINT:  Dyspnea  INITIAL PRESENTATION: 58 year old male presented to [facility] ED 8/20 c/o dyspnea. In ED found to be bradycardic in 30's, atropine given and TC pacing applied. Noted to have worsening renal failure and acidemia. Transferred to [facility] for ICU admission.   STUDIES:   SIGNIFICANT EVENTS: 8/20 admitted to ICU with worsening renal failure 8/21 off dopamine  SUBJECTIVE: int brady, BIPAP  VITAL SIGNS: Temp:  [94.4 F (34.7 C)-97.4 F (36.3 C)] 97.4 F (36.3 C) (08/21 0745) Pulse Rate:  [37-77] 73 (08/21 0935) Resp:  [12-20] 19 (08/21 0935) BP: (117-152)/(63-85) 152/79 mmHg (08/21 0900) SpO2:  [100 %] 100 % (08/21 0935) FiO2 (%):  [50 %] 50 % (08/21 0800) Weight:  [121.9 kg (268 lb 11.9 oz)] 121.9 kg (268 lb 11.9 oz) (08/21 0404) HEMODYNAMICS:   VENTILATOR SETTINGS: Vent Mode:  [-] BIPAP FiO2 (%):  [50 %] 50 % PEEP:  [6 cmH20] 6 cmH20 Pressure Support:  [8 cmH20] 8 cmH20 INTAKE / OUTPUT:  Intake/Output Summary (Last 24 hours) at 10/12/13 1016 Last data filed at 10/12/13 0900  Gross per 24 hour  Intake 1046.3 ml  Output    530 ml  Net  516.3 ml    PHYSICAL EXAMINATION: General:  Obese male in NAD on BiPAP Neuro:  Alert, oriented HEENT:  /AT, PERRL, no JVD Cardiovascular: s1 s2   Irreg Irreg, Brady intermittent not Lungs:  Clear bilaterally, reduced Abdomen:  Soft, mildly tender, non-distended Musculoskeletal:  No acute deformity. Trace BLE edema Skin:  Intact  LABS:  CBC  Recent Labs Lab 10/12/13 0448  WBC 5.0  HGB 11.3*  HCT 34.8*  PLT 138*   Coag's  Recent Labs Lab 10/11/13 1110 10/12/13 0448  INR 2.1 2.38*   BMET  Recent Labs Lab 10/12/13 0448  NA 142  K 5.1  CL 104  CO2 22  BUN 80*  CREATININE 6.90*  GLUCOSE 93    Electrolytes  Recent Labs Lab 10/12/13 0448  CALCIUM 9.4  MG 2.5  PHOS 5.5*   Sepsis Markers  Recent Labs Lab 10/12/13 0400 10/12/13 0448  LATICACIDVEN 1.6  --   PROCALCITON  --  1.35   ABG  Recent Labs Lab 10/12/13 0352  PHART 7.405  PCO2ART 32.2*  PO2ART 111.0*   Liver Enzymes  Recent Labs Lab 10/12/13 0448  AST 77*  ALT 54*  ALKPHOS 37*  BILITOT 1.1  ALBUMIN 2.7*   Cardiac Enzymes No results found for this basename: TROPONINI, PROBNP,  in the last 168 hours Glucose  Recent Labs Lab 10/12/13 0319 10/12/13 0743  GLUCAP 91 85    Imaging No results found.   ASSESSMENT / PLAN:  PULMONARY A: Acute hypercarbic respiratory failure  P:   BiPAP as prn, just off, hold off on scheduled Keeps SpO2 > 92% Obtain pcxr for volume With normal ph, use cpap (home pressures) noctrunal  CARDIOVASCULAR A:  AF with bradycardia Cardiomyopathy H/o HTN, HTN  P:  Atropine at bedside Dopamine gtt now off and remaining with HR 76 2D Echo awaited Hold preadmission diltiazem, coreg, losartan, statin, fibrate  Repeat trop Pacer pads on chest Correct K Add hydralazine  RENAL A:   Acute on chronic kidney disease Hyperkalemia - Treated at [facility] Hx or renal ca, R nephrectomy Small AG metabolic acidosis  Hypovolemia from GI? As cause?   P:   Bicarb continued drip Consult renal  May require urgent HD Repeat Bmet now, then every 6 hours.  Repeat kayexalate  Get UA Image kidneys, [place]  GASTROINTESTINAL A:   Resolved diarrhea  P:   Start renal diet SUP: IV protonix  HEMATOLOGIC A:   Coumadin coagulopathy  P:  Continue warfarin per pharmacy dosing Follow coags Follow CBC   INFECTIOUS A:   ? Sepsis, doubt  P:   BCx2 8/21 >>> UC 8/21 >>> Abx: Vancomycin, start date 8/21, day 1>>> Abx: Cefepime, start date 8/21, day 1>>> PCt to dc abx, remembering that 10% is cleared renally likley, may stay flat  ENDOCRINE A:   DM  P:    CBG monitoring  SSI Hold oral DM meds (cozaar)  NEUROLOGIC A:   Acute encephalopathy - drowsy, likely metabolic  P:   RASS goal: 0 Treat uremia   I have seen and examined the patient with nurse practitioner/resident and agree with the note above.   I have personally obtained a history, examined the patient, evaluated laboratory and imaging results, formulated the assessment and plan and placed orders. CRITICAL CARE: The patient is critically ill with multiple organ systems failure and requires high complexity decision making for assessment and support, frequent evaluation and titration of therapies, application of advanced monitoring technologies and extensive interpretation of multiple databases. Critical Care Time devoted to patient care services described in this note is 45 minutes.   Lavon Paganini. Titus Mould, MD, [place] Pgr: [place] [facility]

## 2013-10-12 NOTE — Progress Notes (Signed)
ANTICOAGULATION and ANTIBIOTIC CONSULT NOTE - Initial Consult  Pharmacy Consult for Vancomycin/Cefepime  Indication: r/o sepsis  Pharmacy Consult for Warfarin  Indication: Afib  No Known Allergies  Patient Measurements: Height: 6\' 2"  (188 cm) Weight: 268 lb 11.9 oz (121.9 kg) IBW/kg (Calculated) : 82.2  Vital Signs: Temp: 94.4 F (34.7 C) (08/21 0323) Temp src: Axillary (08/21 0323) BP: 117/63 mmHg (08/21 0400) Pulse Rate: 37 (08/21 0400)  Labs:  Recent Labs  10/11/13 1110  INR 2.1    Estimated Creatinine Clearance: 36.5 ml/min (by C-G formula based on Cr of 3.02).   Medical History: Past Medical History  Diagnosis Date  . Renal cell cancer   . Mixed hyperlipidemia   . Type 2 diabetes mellitus   . Paroxysmal atrial fibrillation   . Essential hypertension, benign   . Chronic renal insufficiency     Never required dialysis - Dr. Lowanda Foster  . Gout   . Diverticulosis of colon   . Colonic polyp   . Cardiomyopathy     LVEF 50-55% 8/11 - [facility]  . OSA (obstructive sleep apnea)     CPAP   Labs at [facility]:  Scr 7.10  WBC 6.9 INR 2.2 Hgb 12.1  Assessment: 58 y/o M tx from [place] with dyspnea, acute on chronic renal failure possibly urgent HD (renal being consulted), starting broad spectrum antibiotics-no antibiotics noted at [facility], also resuming warfarin for afib.  Goal of Therapy:  Vancomycin level 15-20 mg/L, could change if starting HD INR 2-3 Monitor platelets by anticoagulation protocol: Yes   Plan:  -Vancomycin 2500 mg IV x 1, f/u renal plans for HD before giving additional doses -Cefepime 2g IV x 1, f/u possible HD plans before giving additional doses -Trend WBC, temp -Drug levels as indicated   -Warfarin per home regimen (1.25mg  on MWF, 2.5 all other days) -Daily PT/INR -Adjust dose as needed  Narda Bonds 10/12/2013,4:59 AM

## 2013-10-12 NOTE — Progress Notes (Signed)
Utilization review completed. Alecxis Baltzell, RN, BSN. 

## 2013-10-12 NOTE — Progress Notes (Signed)
Hypoglycemic Event  CBG: cbg-51 @ 2030  Treatment: 15 GM carbohydrate snack X2  Symptoms: None  Follow-up CBG: Time:2045 CBG Result:53  Possible Reasons for Event: Unknown  Comments/MD notified:per protocol 1 cup ginger ale given at 2030, cbg 53 @ 2045, 1 cup coke given @ 2050, cbg 59 @ 2110, 1 amp dextrose given IV @ 2115, cbg 140 @ 2130.  Will monitor pt.    Dillard Essex  Remember to initiate Hypoglycemia Order Set & complete

## 2013-10-13 ENCOUNTER — Inpatient Hospital Stay (HOSPITAL_COMMUNITY): Payer: Medicare HMO

## 2013-10-13 DIAGNOSIS — G4733 Obstructive sleep apnea (adult) (pediatric): Secondary | ICD-10-CM

## 2013-10-13 DIAGNOSIS — N189 Chronic kidney disease, unspecified: Secondary | ICD-10-CM

## 2013-10-13 DIAGNOSIS — E872 Acidosis, unspecified: Secondary | ICD-10-CM

## 2013-10-13 DIAGNOSIS — I498 Other specified cardiac arrhythmias: Secondary | ICD-10-CM

## 2013-10-13 DIAGNOSIS — N184 Chronic kidney disease, stage 4 (severe): Secondary | ICD-10-CM

## 2013-10-13 DIAGNOSIS — J9600 Acute respiratory failure, unspecified whether with hypoxia or hypercapnia: Secondary | ICD-10-CM

## 2013-10-13 DIAGNOSIS — N179 Acute kidney failure, unspecified: Principal | ICD-10-CM

## 2013-10-13 LAB — PROCALCITONIN: Procalcitonin: 3.26 ng/mL

## 2013-10-13 LAB — PROTIME-INR
INR: 2.81 — ABNORMAL HIGH (ref 0.00–1.49)
Prothrombin Time: 29.6 seconds — ABNORMAL HIGH (ref 11.6–15.2)

## 2013-10-13 LAB — URINE CULTURE
Colony Count: NO GROWTH
Culture: NO GROWTH
SPECIAL REQUESTS: NORMAL

## 2013-10-13 LAB — RENAL FUNCTION PANEL
Albumin: 2.5 g/dL — ABNORMAL LOW (ref 3.5–5.2)
Anion gap: 17 — ABNORMAL HIGH (ref 5–15)
BUN: 69 mg/dL — ABNORMAL HIGH (ref 6–23)
CALCIUM: 8.6 mg/dL (ref 8.4–10.5)
CHLORIDE: 104 meq/L (ref 96–112)
CO2: 22 mEq/L (ref 19–32)
CREATININE: 6.09 mg/dL — AB (ref 0.50–1.35)
GFR, EST AFRICAN AMERICAN: 10 mL/min — AB (ref >90)
GFR, EST NON AFRICAN AMERICAN: 9 mL/min — AB (ref >90)
Glucose, Bld: 71 mg/dL (ref 70–99)
Phosphorus: 4.4 mg/dL (ref 2.3–4.6)
Potassium: 4.2 mEq/L (ref 3.7–5.3)
SODIUM: 143 meq/L (ref 137–147)

## 2013-10-13 LAB — GLUCOSE, CAPILLARY
GLUCOSE-CAPILLARY: 107 mg/dL — AB (ref 70–99)
GLUCOSE-CAPILLARY: 94 mg/dL (ref 70–99)
GLUCOSE-CAPILLARY: 85 mg/dL (ref 70–99)
GLUCOSE-CAPILLARY: 83 mg/dL (ref 70–99)
GLUCOSE-CAPILLARY: 75 mg/dL (ref 70–99)
Glucose-Capillary: 30 mg/dL — CL (ref 70–99)
Glucose-Capillary: 28 mg/dL — CL (ref 70–99)
Glucose-Capillary: 179 mg/dL — ABNORMAL HIGH (ref 70–99)

## 2013-10-13 MED ORDER — CEFAZOLIN SODIUM 1-5 GM-% IV SOLN: 1.0000 g | INTRAVENOUS | Status: DC

## 2013-10-13 MED ORDER — VITAMIN D3 25 MCG (1000 UNIT) PO TABS
1000.0000 [IU] | ORAL_TABLET | Freq: Every day | ORAL | Status: DC
  Administered 2013-10-13 – 2013-10-20 (×8): 1000 [IU] via ORAL
  Filled 2013-10-13 (×8): qty 1

## 2013-10-13 MED ORDER — DEXTROSE 50 % IV SOLN
50.0000 mL | Freq: Once | INTRAVENOUS | Status: AC | PRN
  Administered 2013-10-13: 50 mL via INTRAVENOUS
  Filled 2013-10-13: qty 50

## 2013-10-13 MED ORDER — CIPROFLOXACIN IN D5W 400 MG/200ML IV SOLN
400.0000 mg | INTRAVENOUS | Status: DC
  Administered 2013-10-13 – 2013-10-14 (×2): 400 mg via INTRAVENOUS
  Filled 2013-10-13 (×2): qty 200

## 2013-10-13 MED ORDER — DEXTROSE 50 % IV SOLN
INTRAVENOUS | Status: AC
  Administered 2013-10-13: 50 mL
  Filled 2013-10-13: qty 50

## 2013-10-13 MED ORDER — DILTIAZEM HCL ER COATED BEADS 240 MG PO CP24
240.0000 mg | ORAL_CAPSULE | Freq: Every day | ORAL | Status: DC
  Administered 2013-10-13 – 2013-10-19 (×7): 240 mg via ORAL
  Filled 2013-10-13 (×7): qty 1

## 2013-10-13 MED ORDER — DEXTROSE 5 % IV SOLN
3.0000 g | INTRAVENOUS | Status: DC
  Filled 2013-10-13: qty 3000

## 2013-10-13 MED ORDER — DEXTROSE-NACL 5-0.45 % IV SOLN
INTRAVENOUS | Status: DC
  Administered 2013-10-13 – 2013-10-15 (×2): via INTRAVENOUS

## 2013-10-13 NOTE — Progress Notes (Signed)
S: Doing well.  No CP/SOB.  Appetite is good.  Interval hx:  Hypoglycemic overnight. Cr improving. UOP good.  O:BP 136/89  Pulse 85  Temp(Src) 97.7 F (36.5 C) (Oral)  Resp 14  Ht 6\' 2"  (1.88 m)  Wt 123.4 kg (272 lb 0.8 oz)  BMI 34.91 kg/m2  SpO2 99%  Intake/Output Summary (Last 24 hours) at 10/13/13 0854 Last data filed at 10/13/13 0800  Gross per 24 hour  Intake   2620 ml  Output   1545 ml  Net   1075 ml   Intake/Output: I/O last 3 completed shifts: In: 3576.3 [P.O.:1200; I.V.:1326.3; IV Piggyback:1050] Out: [id number] [Urine:1675]  Intake/Output this shift:  Total I/O In: 10 [I.V.:10] Out: -  Weight change: 1.5 kg (3 lb 4.9 oz) Gen: in bed in NAD, pleasant and cooperative with exam; empty breakfast tray at bedside [id number] irregular, no m/r/g Resp:CTA B/L, no w/r/r Abd:+ BS, soft, NT, some distention  GU:  200cc clear yellow urine in Foley bag [id number] B/L lower extremity edema, distal pulses palpable   Recent Labs Lab 10/12/13 0448 10/12/13 1155 10/12/13 1615 10/12/13 2203  NA 142 144 144 141  K 5.1 5.0 4.0 3.5*  CL 104 103 105 102  CO2 22 24 23 22   GLUCOSE 93 56* 63* 351*  BUN 80* 77* 75* 70*  CREATININE 6.90* 6.44* 6.28* 5.74*  ALBUMIN 2.7*  --   --   --   CALCIUM 9.4 9.1 9.0 8.1*  PHOS 5.5*  --   --   --   AST 77*  --   --   --   ALT 54*  --   --   --    Liver Function Tests:  Recent Labs Lab 10/12/13 0448  AST 77*  ALT 54*  ALKPHOS 37*  BILITOT 1.1  PROT 7.1  ALBUMIN 2.7*   CBC:  Recent Labs Lab 10/12/13 0448 10/12/13 1155  WBC 5.0 5.5  NEUTROABS 3.4 4.0  HGB 11.3* 10.9*  HCT 34.8* 33.2*  MCV 90.4 90.2  PLT 138* 132*   Cardiac Enzymes:  Recent Labs Lab 10/12/13 1155  TROPONINI <0.30   CBG:  Recent Labs Lab 10/13/13 0343 10/13/13 0413 10/13/13 0705 10/13/13 0707 10/13/13 0801  GLUCAP 30* 94 31* 28* 85   Studies/Results: US Renal  10/13/2013   CLINICAL DATA:  58 year old male with acute renal insufficiency.  History of right nephrectomy. Status post ablation of to left renal masses. Initial encounter.  EXAM: RENAL/URINARY TRACT ULTRASOUND COMPLETE  COMPARISON:  Abdomen MRI 10/08/2009 and non contrast CT Abdomen and Pelvis 12/22/2005.  FINDINGS: Right Kidney:  Length: Surgically absent. In the right renal fossa there is a large simple appearing cyst measuring up to 7.8 cm diameter. This is new since 2011, and the etiology is unclear.  Left Kidney:  Length: 14.9 cm. No hydronephrosis. Multiple hypoechoic cyst. Exophytic 5.9 cm lateral left renal mass, but without vascularity on Doppler interrogation (image 11 power Doppler) common probably reflects an ablated lesion.  Bladder:  Decompressed.  Reportedly Foley catheter in place.  Other findings:    Pelvic free fluid.  IMPRESSION: 1. Solitary left kidney with no hydronephrosis. History of ablated left renal masses. 2. 8 cm simple appearing cystic collection in the right renal fossa, was not present on the post nephrectomy comparisons in 2011 or 2007. Etiology and significance unclear. 3. Small volume pelvic free fluid.   Electronically Signed   By: Lars Pinks M.D.   On: 10/13/2013 00:55  Dg Chest Port 1 View  10/13/2013   CLINICAL DATA:  Assess pulmonary edema  EXAM: PORTABLE CHEST - 1 VIEW  COMPARISON:  Prior chest x-ray 10/12/2013  FINDINGS: Stable cardiac and mediastinal contours. Persistent right basilar atelectasis versus infiltrate. Mild subsegmental atelectasis also noted in the left retrocardiac region. No evidence of pulmonary edema. Atherosclerotic calcifications are present within the transverse aorta. External defibrillator pad projects the left chest. No pneumothorax.  IMPRESSION: 1. Interval resolution of mild interstitial edema. 2. Persistent right greater than left bibasilar opacities which may reflect atelectasis or infiltrate. Atelectasis is favored. 3. Stable cardiomegaly.   Electronically Signed   By: Jacqulynn Cadet M.D.   On: 10/13/2013 08:10    Dg Chest Port 1 View  10/12/2013   CLINICAL DATA:  Edema.  EXAM: PORTABLE CHEST - 1 VIEW  COMPARISON:  10/11/2013.  FINDINGS: Mediastinum and hilar structures are unremarkable. Cardiomegaly with mild pulmonary vascular prominence. Mild interstitial prominence. Mild congestive heart failure cannot be excluded. Mild basilar atelectasis on the right. Pneumonia cannot be excluded. No pneumothorax. No acute bony abnormality  IMPRESSION: 1. Mild congestive heart failure. 2. Right base subsegmental atelectasis versus mild pneumonia.   Electronically Signed   By: Marcello Moores  Register   On: 10/12/2013 11:45   . antiseptic oral rinse  7 mL Mouth Rinse BID  . [START ON 10/15/2013]  ceFAZolin (ANCEF) IV  1 g Intravenous On Call to OR  . ciprofloxacin  400 mg Intravenous Q24H  . insulin aspart  2-6 Units Subcutaneous 6 times per day  . metronidazole  500 mg Intravenous Q8H  . warfarin  1.25 mg Oral Q M,W,F-1800  . warfarin  2.5 mg Oral Q T,Th,S,Su-1800  . Warfarin - Pharmacist Dosing Inpatient   Does not apply q1800    BMET    Component Value Date/Time   NA 141 10/12/2013 2203   K 3.5* 10/12/2013 2203   CL 102 10/12/2013 2203   CO2 22 10/12/2013 2203   GLUCOSE 351* 10/12/2013 2203   BUN 70* 10/12/2013 2203   CREATININE 5.74* 10/12/2013 2203   CALCIUM 8.1* 10/12/2013 2203   CALCIUM 9.0 03/21/2007 0415   GFRNONAA 10* 10/12/2013 2203   GFRAA 11* 10/12/2013 2203   CBC    Component Value Date/Time   WBC 5.5 10/12/2013 1155   RBC 3.68* 10/12/2013 1155   RBC 4.51 03/16/2007 0610   HGB 10.9* 10/12/2013 1155   HCT 33.2* 10/12/2013 1155   PLT 132* 10/12/2013 1155   MCV 90.2 10/12/2013 1155   MCH 29.6 10/12/2013 1155   MCHC 32.8 10/12/2013 1155   RDW 16.6* 10/12/2013 1155   LYMPHSABS 1.1 10/12/2013 1155   MONOABS 0.3 10/12/2013 1155   EOSABS 0.0 10/12/2013 1155   BASOSABS 0.0 10/12/2013 1155   Lab Results  Component Value Date   CREATININE 5.74* 10/12/2013   CREATININE 6.28* 10/12/2013   CREATININE 6.44* 10/12/2013     Assessment/Plan: 1. AKI on CKD5 - 2/2 Lasix and ACEI use in the setting of diarrhea.  Concern LUE fistula may have some stenosis.  Last Cr (10/12/13 at 10PM) showed improving Cr.  Next BMP due for 10AM this AM.  No obstruction on renal US.  UOP 1545 yesterday. - No need for urgent HD today given improving Cr (now at baseline).   - Will continue to monitor renal function with BMP and UOP to determine need for HD.    - Vascular consulted and appreciate their assistance, plan for fistulogram on 08/24.   2.  Metabolic acidosis, improving - bicarb 22, bicarb gtt d/c on 08/21.  He is on sodium bicarb supplement as outpatient and this can be resumed. 3. Hyper/hypokalemia - Initial hyperkalemia likely due to worsening renal function.  His K has trended down with treatment and he is now mildly hypokalemic, 3.5.  No supplement in this patient with CKD5.  Will monitor as he is taking good po. 4. Acute hypercapnic respiratory failure, improving - currently on 4L via [place]; management per PCCM 5. Afib - INR therapeutic; coumadin per pharmacy 6. Bradycardia/hypotension improving - off dopamine gtt, management per PCCM  - Likely due to A fib and concomitant use of dilt/coreg and mild hyperkalemia and metabolic acidosis - May need further workup by EP if it recurs. 7. Cardiomyopathy - EF 55% on 2D ECHO 08/21 8. Diarrhea, improving - had normal BM this AM 9. DM - hypoglycemic overnight, unclear etiology as he is eating/drinking and has not gotten any hypoglycemic medication while admitted (SSI ordered, however he has not received any because his CBGs have been low-normal); management per PCCM 10. HTN - hypotension has resolved and pt now hypertensive; continue to hold Losartan and Lasix in the setting of AKI.  He is getting hydralazine prn. 11. Anemia of chronic kidney disease - stablg. Hgb 10.9, near baseline of 11.  Duwaine Maxin, DO  IMTS, PGY2  [phone] 10/13/2013, 8:53 AM   I have seen and examined this  patient and agree with plan as outlined by Dr. Redmond Pulling.  Scr now back at baseline.  Pt to have fistulogram Monday and +/- HD depending upon electrolytes/volume but should be able to avoid HD this admission.  He will need to follow up with Dr. Lowanda Foster closely once discharged. Shafin Pollio A,MD 10/13/2013 12:10 PM

## 2013-10-13 NOTE — Progress Notes (Signed)
Hypoglycemic Event  CBG: cbg-30  Treatment: D50 IV 50 mL  Symptoms: None  Follow-up CBG: Time:0405 CBG Result:cbg-94  Possible Reasons for Event: Unknown  Comments/MD notified:Dr. Annabell Sabal, Carren Rang  Remember to initiate Hypoglycemia Order Set & complete

## 2013-10-13 NOTE — Progress Notes (Signed)
Patient transferred from [facility] tele, alert and oriented x4, afib with pvcs on monitor, plan for placement fistula in L arm, Monday

## 2013-10-13 NOTE — Progress Notes (Signed)
PULMONARY / CRITICAL CARE MEDICINE   Name: Albert [name] MRN: [id number] DOB: 1955/11/23    ADMISSION DATE:  10/12/2013 CONSULTATION DATE:  10/12/2013  REFERRING MD :  Corena Pilgrim EDP  CHIEF COMPLAINT:  Dyspnea  INITIAL PRESENTATION: 58 year old male presented to [facility] ED 8/20 c/o dyspnea. In ED found to be bradycardic in 30's, atropine given and TC pacing applied. Noted to have worsening renal failure and acidemia. Transferred to [facility] for ICU admission.   STUDIES:  Renal u/s>>> 1. Solitary left kidney with no hydronephrosis. History of ablated left renal masses. 2. 8 cm simple appearing cystic collection in the right renal fossa, was not present on the post nephrectomy comparisons in 2011 or 2007. Etiology and significance unclear. 3. Small volume pelvic free fluid. 2D echo >> EF 55%, mod/severely dilated LA, PA press 63mmHg   SIGNIFICANT EVENTS: 8/20 admitted to ICU with worsening renal failure 8/21 off dopamine  SUBJECTIVE: no further bipap, wore home CPAP overnight.  Denies abd pain, SOB.  Hypoglycemia.   VITAL SIGNS: Temp:  [97.4 F (36.3 C)-98.4 F (36.9 C)] 97.7 F (36.5 C) (08/22 0800) Pulse Rate:  [64-101] 85 (08/22 0800) Resp:  [14-29] 14 (08/22 0800) BP: (127-171)/(69-104) 136/89 mmHg (08/22 0800) SpO2:  [96 %-100 %] 99 % (08/22 0800) Weight:  [272 lb 0.8 oz (123.4 kg)] 272 lb 0.8 oz (123.4 kg) (08/22 0400) HEMODYNAMICS:   VENTILATOR SETTINGS:   INTAKE / OUTPUT:  Intake/Output Summary (Last 24 hours) at 10/13/13 1000 Last data filed at 10/13/13 0800  Gross per 24 hour  Intake   2160 ml  Output    995 ml  Net   1165 ml    PHYSICAL EXAMINATION: General:  Obese male in NAD  Neuro:  Alert, oriented HEENT:  [place]/AT, PERRL, no JVD Cardiovascular: s1 s2   Irreg Irreg Lungs:  resps even non labored, diminished throughout  Abdomen:  Soft, mildly tender, non-distended Musculoskeletal:  No acute deformity. Trace BLE edema Skin:   Intact  LABS:  CBC  Recent Labs Lab 10/12/13 0448 10/12/13 1155  WBC 5.0 5.5  HGB 11.3* 10.9*  HCT 34.8* 33.2*  PLT 138* 132*   Coag's  Recent Labs Lab 10/11/13 1110 10/12/13 0448  INR 2.1 2.38*   BMET  Recent Labs Lab 10/12/13 1155 10/12/13 1615 10/12/13 2203  NA 144 144 141  K 5.0 4.0 3.5*  CL 103 105 102  CO2 24 23 22   BUN 77* 75* 70*  CREATININE 6.44* 6.28* 5.74*  GLUCOSE 56* 63* 351*   Electrolytes  Recent Labs Lab 10/12/13 0448 10/12/13 1155 10/12/13 1615 10/12/13 2203  CALCIUM 9.4 9.1 9.0 8.1*  MG 2.5  --   --   --   PHOS 5.5*  --   --   --    Sepsis Markers  Recent Labs Lab 10/12/13 0400 10/12/13 0448 10/12/13 1155  LATICACIDVEN 1.6  --   --   PROCALCITON  --  1.35 2.11   ABG  Recent Labs Lab 10/12/13 0352  PHART 7.405  PCO2ART 32.2*  PO2ART 111.0*   Liver Enzymes  Recent Labs Lab 10/12/13 0448  AST 77*  ALT 54*  ALKPHOS 37*  BILITOT 1.1  ALBUMIN 2.7*   Cardiac Enzymes  Recent Labs Lab 10/12/13 1155  TROPONINI <0.30   Glucose  Recent Labs Lab 10/12/13 2305 10/13/13 0343 10/13/13 0413 10/13/13 0705 10/13/13 0707 10/13/13 0801  GLUCAP 107* 30* 94 31* 28* 85    Imaging US Renal  10/13/2013  CLINICAL DATA:  58 year old male with acute renal insufficiency. History of right nephrectomy. Status post ablation of to left renal masses. Initial encounter.  EXAM: RENAL/URINARY TRACT ULTRASOUND COMPLETE  COMPARISON:  Abdomen MRI 10/08/2009 and non contrast CT Abdomen and Pelvis 12/22/2005.  FINDINGS: Right Kidney:  Length: Surgically absent. In the right renal fossa there is a large simple appearing cyst measuring up to 7.8 cm diameter. This is new since 2011, and the etiology is unclear.  Left Kidney:  Length: 14.9 cm. No hydronephrosis. Multiple hypoechoic cyst. Exophytic 5.9 cm lateral left renal mass, but without vascularity on Doppler interrogation (image 11 power Doppler) common probably reflects an ablated  lesion.  Bladder:  Decompressed.  Reportedly Foley catheter in place.  Other findings:    Pelvic free fluid.  IMPRESSION: 1. Solitary left kidney with no hydronephrosis. History of ablated left renal masses. 2. 8 cm simple appearing cystic collection in the right renal fossa, was not present on the post nephrectomy comparisons in 2011 or 2007. Etiology and significance unclear. 3. Small volume pelvic free fluid.   Electronically Signed   By: Lars Pinks M.D.   On: 10/13/2013 00:55   Dg Chest Port 1 View  10/12/2013   CLINICAL DATA:  Edema.  EXAM: PORTABLE CHEST - 1 VIEW  COMPARISON:  10/11/2013.  FINDINGS: Mediastinum and hilar structures are unremarkable. Cardiomegaly with mild pulmonary vascular prominence. Mild interstitial prominence. Mild congestive heart failure cannot be excluded. Mild basilar atelectasis on the right. Pneumonia cannot be excluded. No pneumothorax. No acute bony abnormality  IMPRESSION: 1. Mild congestive heart failure. 2. Right base subsegmental atelectasis versus mild pneumonia.   Electronically Signed   By: Marcello Moores  Register   On: 10/12/2013 11:45     ASSESSMENT / PLAN:  PULMONARY A: Acute hypercarbic respiratory failure  P:   D/c bipap  pulm hygiene  F/u cxr  Cont home cpap qhs  Supplemental O2 as need for SpO2 > 92%   CARDIOVASCULAR A:  AF with bradycardia  Cardiomyopathy H/o HTN, HTN P:  Off dopamine, d/c 2D echo as above - some Pulm HTN,  Hold preadmission diltiazem, coreg, losartan, statin, fibrate  PRN hydralazine  Resume preadmission diltiazem at reduced dose with resolved brady (r/t hyperkalemia)  RENAL A:   Acute on chronic kidney disease Hyperkalemia - Treated at [facility] Hx or renal ca, R nephrectomy Small AG metabolic acidosis Hypovolemia from GI? As cause?  P:   Renal following  Change bmet to q12  D/c kayexalate    GASTROINTESTINAL A:   Resolved diarrhea  P:   Cont diet  SUP: IV protonix  HEMATOLOGIC A:   Coumadin  coagulopathy  P:  Continue warfarin per pharmacy dosing Follow coags Follow CBC   INFECTIOUS A:   ? Sepsis, doubt P:   BCx2 8/21 >>> UC 8/21 >>>neg  Abx:  cipro 8/21>>>8/22 Flagyl 8/21>>>8/22 Follow pct  D/c cipro, flagyl   ENDOCRINE A:   DM Hypoglycemia - per pt has been having more trouble with this at home as well  P:   CBG monitoring  D/c SSI for now  Hold oral DM meds (cozaar) Gentle D5  NEUROLOGIC A:   Acute encephalopathy - resolved  P:   RASS goal: 0 Treat uremia   Much improved overall.  Renal following.  For fistulogram 8/24.  Will tx to tele and ask [facility] to assume care 8/23.   Nickolas Madrid, NP 10/13/2013  10:00 AM Pager: [phone] or [phone]   I have  personally obtained a history, examined the patient, evaluated laboratory and imaging results, formulated the assessment and plan and placed orders.  Baltazar Apo, MD, PhD 10/13/2013, 4:14 PM [facility] [phone] or if no answer [phone]

## 2013-10-13 NOTE — Progress Notes (Signed)
ANTIBIOTIC CONSULT NOTE - FOLLOW UP  Pharmacy Consult for Cipro Indication: rule out sepsis and presumed GI source  No Known Allergies  Patient Measurements: Height: 6\' 2"  (188 cm) Weight: 272 lb 0.8 oz (123.4 kg) IBW/kg (Calculated) : 82.2 Vital Signs: Temp: 97.7 F (36.5 C) (08/22 0800) Temp src: Oral (08/22 0800) BP: 136/89 mmHg (08/22 0800) Pulse Rate: 85 (08/22 0800) Intake/Output from previous day: 08/21 0701 - 08/22 0700 In: 2720 [P.O.:1200; I.V.:1020; IV Piggyback:500] Out: [id number] [Urine:1545] Intake/Output from this shift: Total I/O In: 10 [I.V.:10] Out: -   Labs:  Recent Labs  10/12/13 0448 10/12/13 1155 10/12/13 1615 10/12/13 2203  WBC 5.0 5.5  --   --   HGB 11.3* 10.9*  --   --   PLT 138* 132*  --   --   CREATININE 6.90* 6.44* 6.28* 5.74*   Estimated Creatinine Clearance: 19.3 ml/min (by C-G formula based on Cr of 5.74).  Microbiology: Recent Results (from the past 720 hour(s))  URINE CULTURE     Status: None   Collection Time    10/12/13  4:11 AM      Result Value Ref Range Status   Specimen Description URINE, CATHETERIZED   Final   Special Requests Normal   Final   Culture  Setup Time     Final   Value: 10/12/2013 08:37     Performed at [facility]     Final   Value: NO GROWTH     Performed at [facility]   Culture     Final   Value: NO GROWTH     Performed at [facility]   Report Status 10/13/2013 FINAL   Final  MRSA PCR SCREENING     Status: None   Collection Time    10/12/13  4:14 AM      Result Value Ref Range Status   MRSA by PCR NEGATIVE  NEGATIVE Final   Comment:            The GeneXpert MRSA Assay (FDA     approved for NASAL specimens     only), is one component of a     comprehensive MRSA colonization     surveillance program. It is not     intended to diagnose MRSA     infection nor to guide or     monitor treatment for     MRSA infections.    Anti-infectives   Start      Dose/Rate Route Frequency Ordered Stop   10/15/13 0600  ceFAZolin (ANCEF) IVPB 1 g/50 mL premix    Comments:  Send with pt to OR   1 g 100 mL/hr over 30 Minutes Intravenous On call to O.R. 10/13/13 0801 10/16/13 0559   10/13/13 1200  ciprofloxacin (CIPRO) IVPB 400 mg     400 mg 200 mL/hr over 60 Minutes Intravenous Every 24 hours 10/13/13 0803     10/12/13 1400  ciprofloxacin (CIPRO) IVPB 400 mg     400 mg 200 mL/hr over 60 Minutes Intravenous  Once 10/12/13 1200 10/12/13 1419   10/12/13 1200  ciprofloxacin (CIPRO) IVPB 400 mg  Status:  Discontinued     400 mg 200 mL/hr over 60 Minutes Intravenous  Once 10/12/13 1107 10/12/13 1200   10/12/13 1200  metroNIDAZOLE (FLAGYL) IVPB 500 mg     500 mg 100 mL/hr over 60 Minutes Intravenous Every 8 hours 10/12/13 1107     10/12/13 0515  vancomycin (  VANCOCIN) 2,500 mg in sodium chloride 0.9 % 500 mL IVPB     2,500 mg 250 mL/hr over 120 Minutes Intravenous  Once 10/12/13 0508 10/12/13 0813   10/12/13 0515  ceFEPIme (MAXIPIME) 2 g in dextrose 5 % 50 mL IVPB     2 g 100 mL/hr over 30 Minutes Intravenous  Once 10/12/13 0508 10/12/13 [id number]     Assessment: 58 YO obese male with sepsis with presumed GI source changed from vancomycin and cefepime to cipro + flagyl. SCr slowly improving at 5.74 with current estimated CrCl~19.3 mL/min. UOP ~ 0.5 cc/kg/hr. Note patient has solitary kidney and renal US shows no hydronephrosis. Nephrology consults and no urgent need for HD at this time.   Goal of Therapy:  Clinical resolution of infection  Plan:  1. Cipro 400mg  IV q24h.  2. Follow-up renal function and nephrology plans- adjust therapy as needed.  3. Monitor renal function and clinical status.   Sloan Leiter, PharmD, BCPS Clinical Pharmacist [phone] 10/13/2013,10:28 AM

## 2013-10-13 NOTE — Progress Notes (Addendum)
Patient will need an INR less than 2.0 for fistulogram on Monday.  I have written to hold his Coumadin.  Annamarie Major

## 2013-10-14 ENCOUNTER — Inpatient Hospital Stay (HOSPITAL_COMMUNITY): Payer: Medicare HMO

## 2013-10-14 DIAGNOSIS — I4891 Unspecified atrial fibrillation: Secondary | ICD-10-CM

## 2013-10-14 LAB — PROCALCITONIN: Procalcitonin: 3.01 ng/mL

## 2013-10-14 LAB — PROTIME-INR
INR: 2.33 — AB (ref 0.00–1.49)
Prothrombin Time: 25.6 seconds — ABNORMAL HIGH (ref 11.6–15.2)

## 2013-10-14 LAB — RENAL FUNCTION PANEL
ALBUMIN: 2.4 g/dL — AB (ref 3.5–5.2)
ANION GAP: 16 — AB (ref 5–15)
BUN: 70 mg/dL — ABNORMAL HIGH (ref 6–23)
CALCIUM: 8.6 mg/dL (ref 8.4–10.5)
CO2: 21 mEq/L (ref 19–32)
Chloride: 105 mEq/L (ref 96–112)
Creatinine, Ser: 5.9 mg/dL — ABNORMAL HIGH (ref 0.50–1.35)
GFR, EST AFRICAN AMERICAN: 11 mL/min — AB (ref >90)
GFR, EST NON AFRICAN AMERICAN: 9 mL/min — AB (ref >90)
Glucose, Bld: 83 mg/dL (ref 70–99)
POTASSIUM: 4.3 meq/L (ref 3.7–5.3)
Phosphorus: 4.3 mg/dL (ref 2.3–4.6)
SODIUM: 142 meq/L (ref 137–147)

## 2013-10-14 LAB — GLUCOSE, CAPILLARY
GLUCOSE-CAPILLARY: 149 mg/dL — AB (ref 70–99)
Glucose-Capillary: 76 mg/dL (ref 70–99)

## 2013-10-14 LAB — CBC
HCT: 33.4 % — ABNORMAL LOW (ref 39.0–52.0)
Hemoglobin: 11.2 g/dL — ABNORMAL LOW (ref 13.0–17.0)
MCH: 29 pg (ref 26.0–34.0)
MCHC: 33.5 g/dL (ref 30.0–36.0)
MCV: 86.5 fL (ref 78.0–100.0)
PLATELETS: 142 10*3/uL — AB (ref 150–400)
RBC: 3.86 MIL/uL — ABNORMAL LOW (ref 4.22–5.81)
RDW: 16.6 % — AB (ref 11.5–15.5)
WBC: 5.8 10*3/uL (ref 4.0–10.5)

## 2013-10-14 LAB — CORTISOL-AM, BLOOD: Cortisol - AM: 19.5 ug/dL (ref 4.3–22.4)

## 2013-10-14 MED ORDER — CIPROFLOXACIN HCL 250 MG PO TABS
250.0000 mg | ORAL_TABLET | Freq: Every day | ORAL | Status: DC
  Administered 2013-10-14 – 2013-10-20 (×7): 250 mg via ORAL
  Filled 2013-10-14 (×7): qty 1

## 2013-10-14 MED ORDER — METRONIDAZOLE 500 MG PO TABS
500.0000 mg | ORAL_TABLET | Freq: Three times a day (TID) | ORAL | Status: DC
Start: 2013-10-14 — End: 2013-10-20
  Administered 2013-10-14 – 2013-10-20 (×17): 500 mg via ORAL
  Filled 2013-10-14 (×21): qty 1

## 2013-10-14 NOTE — Progress Notes (Signed)
[facility] PROGRESS NOTE  Albert [name] [id number] DOB: [birth year] DOA: 10/12/2013 PCP: [name], MD  Assessment/Plan: 58 year old male presented to [facility] ED 8/20 c/o dyspnea. In ED found to be bradycardic in 30's, atropine given and TC pacing applied. Noted to have worsening renal failure and acidemia. Transferred to [facility] for ICU admission.   Acute hypercarbic respiratory failure  Cont home cpap qhs  Supplemental O2 as need for SpO2 > 92% Chest x ray 8-23: slight improvement in right basilar atelectasis, right lung hypoinflation.  Incentive spirometry.  For fistulogram 8/24  AF with bradycardia , Cardiomyopathy -Resume preadmission diltiazem at reduced dose with resolved brady (r/t hyperkalemia) -2D echo as above - some Pulm HTN,  -coumadin on hold, INR need to be less than 2 for fistulogram on Monday.   Diabetes; Hypoglycemia: has not received hypoglycemic medications. If hypoglycemia reoccurs will need to order insulin level, sulfonylurea level. Will check cortisol,   Acute on chronic kidney disease: Decrease volume from GI loss ?  Hyperkalemia - Treated at [facility], resolved.  Small AG metabolic acidosis, improving.   Sepsis: was on dobutamine. Blood culture; no growth to date. Urine culture: no growth to date.  Pro calcitonin at 3.01 Diarrhea; on Cipro and flagyl day 3. Abdominal distension. Check KUB, Abdominal US to evaluate for ascites, depending on results might need CT abdomen and pelvis.   Acute encephalopathy - resolved  Hx or renal ca, R nephrectomy     Code Status: Full Code.  Family Communication: Care discussed with patient.  Disposition Plan: To be determine.    Consultants:  Nephrologist  CCM   Vascular.   Procedures: Renal u/s>>> 1. Solitary left kidney with no hydronephrosis. History of ablated left renal masses. 2. 8 cm simple appearing cystic collection in the right renal fossa, was not present on the post nephrectomy  comparisons in 2011 or 2007. Etiology and significance unclear. 3. Small volume pelvic free fluid.  2D echo >> EF 55%, mod/severely dilated LA, PA press 17mmHg  Events:  8/20 admitted to ICU with worsening renal failure  8/21 off dopamine   Antibiotics:  Ciprofloxacin 8-22  Flagyl 8-21   HPI/Subjective: Feeling well, better. Still with loose stool twice a day.  Relates worsening abdominal distension for lat 3 weeks.   Objective: Filed Vitals:   10/14/13 0338  BP: 152/86  Pulse: 91  Temp:   Resp: 20    Intake/Output Summary (Last 24 hours) at 10/14/13 0916 Last data filed at 10/13/13 1700  Gross per 24 hour  Intake    484 ml  Output    275 ml  Net    209 ml   Filed Weights   10/12/13 0404 10/13/13 0400 10/13/13 2138  Weight: 121.9 kg (268 lb 11.9 oz) 123.4 kg (272 lb 0.8 oz) 123.56 kg (272 lb 6.4 oz)    Exam:   General:  Alert in no distress.   Cardiovascular: S 1, S 2 RRR  Respiratory: decrease breath sound, no wheezing.   Abdomen: very distended, no tenderness, no rigidity.   Musculoskeletal: trace edema  Data Reviewed: Basic Metabolic Panel:  Recent Labs Lab 10/12/13 0448 10/12/13 1155 10/12/13 1615 10/12/13 2203 10/13/13 1254 10/14/13 0535  NA 142 144 144 141 143 142  K 5.1 5.0 4.0 3.5* 4.2 4.3  CL 104 103 105 102 104 105  CO2 22 24 23 22 22 21   GLUCOSE 93 56* 63* 351* 71 83  BUN 80* 77* 75* 70* 69* 70*  CREATININE  6.90* 6.44* 6.28* 5.74* 6.09* 5.90*  CALCIUM 9.4 9.1 9.0 8.1* 8.6 8.6  MG 2.5  --   --   --   --   --   PHOS 5.5*  --   --   --  4.4 4.3   Liver Function Tests:  Recent Labs Lab 10/12/13 0448 10/13/13 1254 10/14/13 0535  AST 77*  --   --   ALT 54*  --   --   ALKPHOS 37*  --   --   BILITOT 1.1  --   --   PROT 7.1  --   --   ALBUMIN 2.7* 2.5* 2.4*   No results found for this basename: LIPASE, AMYLASE,  in the last 168 hours No results found for this basename: AMMONIA,  in the last 168 hours CBC:  Recent Labs Lab  10/12/13 0448 10/12/13 1155 10/14/13 0535  WBC 5.0 5.5 5.8  NEUTROABS 3.4 4.0  --   HGB 11.3* 10.9* 11.2*  HCT 34.8* 33.2* 33.4*  MCV 90.4 90.2 86.5  PLT 138* 132* 142*   Cardiac Enzymes:  Recent Labs Lab 10/12/13 1155  TROPONINI <0.30   BNP (last 3 results) No results found for this basename: PROBNP,  in the last 8760 hours CBG:  Recent Labs Lab 10/13/13 0801 10/13/13 1105 10/13/13 1504 10/13/13 2139 10/14/13 0746  GLUCAP 85 83 75 179* 76    Recent Results (from the past 240 hour(s))  URINE CULTURE     Status: None   Collection Time    10/12/13  4:11 AM      Result Value Ref Range Status   Specimen Description URINE, CATHETERIZED   Final   Special Requests Normal   Final   Culture  Setup Time     Final   Value: 10/12/2013 08:37     Performed at [place]     Final   Value: NO GROWTH     Performed at [facility]   Culture     Final   Value: NO GROWTH     Performed at [facility]   Report Status 10/13/2013 FINAL   Final  MRSA PCR SCREENING     Status: None   Collection Time    10/12/13  4:14 AM      Result Value Ref Range Status   MRSA by PCR NEGATIVE  NEGATIVE Final   Comment:            The GeneXpert MRSA Assay (FDA     approved for NASAL specimens     only), is one component of a     comprehensive MRSA colonization     surveillance program. It is not     intended to diagnose MRSA     infection nor to guide or     monitor treatment for     MRSA infections.  CULTURE, BLOOD (ROUTINE X 2)     Status: None   Collection Time    10/12/13  4:44 AM      Result Value Ref Range Status   Specimen Description BLOOD LEFT HAND   Final   Special Requests BOTTLES DRAWN AEROBIC ONLY 5CC   Final   Culture  Setup Time     Final   Value: 10/12/2013 08:29     Performed at [facility]   Culture     Final   Value:        BLOOD CULTURE RECEIVED NO GROWTH  TO DATE CULTURE WILL BE HELD FOR 5 DAYS BEFORE ISSUING A  FINAL NEGATIVE REPORT     Performed at [facility]   Report Status PENDING   Incomplete  CULTURE, BLOOD (ROUTINE X 2)     Status: None   Collection Time    10/12/13  4:48 AM      Result Value Ref Range Status   Specimen Description BLOOD LEFT HAND   Final   Special Requests BOTTLES DRAWN AEROBIC AND ANAEROBIC 5CC   Final   Culture  Setup Time     Final   Value: 10/12/2013 08:29     Performed at [facility]   Culture     Final   Value:        BLOOD CULTURE RECEIVED NO GROWTH TO DATE CULTURE WILL BE HELD FOR 5 DAYS BEFORE ISSUING A FINAL NEGATIVE REPORT     Performed at [facility]   Report Status PENDING   Incomplete     Studies: US Renal  10/13/2013   CLINICAL DATA:  58 year old male with acute renal insufficiency. History of right nephrectomy. Status post ablation of to left renal masses. Initial encounter.  EXAM: RENAL/URINARY TRACT ULTRASOUND COMPLETE  COMPARISON:  Abdomen MRI 10/08/2009 and non contrast CT Abdomen and Pelvis 12/22/2005.  FINDINGS: Right Kidney:  Length: Surgically absent. In the right renal fossa there is a large simple appearing cyst measuring up to 7.8 cm diameter. This is new since 2011, and the etiology is unclear.  Left Kidney:  Length: 14.9 cm. No hydronephrosis. Multiple hypoechoic cyst. Exophytic 5.9 cm lateral left renal mass, but without vascularity on Doppler interrogation (image 11 power Doppler) common probably reflects an ablated lesion.  Bladder:  Decompressed.  Reportedly Foley catheter in place.  Other findings:    Pelvic free fluid.  IMPRESSION: 1. Solitary left kidney with no hydronephrosis. History of ablated left renal masses. 2. 8 cm simple appearing cystic collection in the right renal fossa, was not present on the post nephrectomy comparisons in 2011 or 2007. Etiology and significance unclear. 3. Small volume pelvic free fluid.   Electronically Signed   By: Lars Pinks M.D.   On: 10/13/2013 00:55   Dg Chest Portable 1  View  10/14/2013   CLINICAL DATA:  Shortness of breath  EXAM: PORTABLE CHEST - 1 VIEW  COMPARISON:  Portable chest x-ray of October 13, 2013  FINDINGS: The right lung remains hypoinflated. Atelectasis at the lung base has improved however. The left lung is better inflated and clear. The cardiac silhouette is mildly enlarged. The pulmonary vascularity is normal.  IMPRESSION: There is been slight improvement in right basilar atelectasis. Otherwise allowing for hypoinflation and patient positioning the examination is unchanged.   Electronically Signed   By: David  Martinique   On: 10/14/2013 08:40   Dg Chest Port 1 View  10/13/2013   CLINICAL DATA:  Assess pulmonary edema  EXAM: PORTABLE CHEST - 1 VIEW  COMPARISON:  Prior chest x-ray 10/12/2013  FINDINGS: Stable cardiac and mediastinal contours. Persistent right basilar atelectasis versus infiltrate. Mild subsegmental atelectasis also noted in the left retrocardiac region. No evidence of pulmonary edema. Atherosclerotic calcifications are present within the transverse aorta. External defibrillator pad projects the left chest. No pneumothorax.  IMPRESSION: 1. Interval resolution of mild interstitial edema. 2. Persistent right greater than left bibasilar opacities which may reflect atelectasis or infiltrate. Atelectasis is favored. 3. Stable cardiomegaly.   Electronically Signed   By: Dellis Filbert.D.  On: 10/13/2013 08:10   Dg Chest Port 1 View  10/12/2013   CLINICAL DATA:  Edema.  EXAM: PORTABLE CHEST - 1 VIEW  COMPARISON:  10/11/2013.  FINDINGS: Mediastinum and hilar structures are unremarkable. Cardiomegaly with mild pulmonary vascular prominence. Mild interstitial prominence. Mild congestive heart failure cannot be excluded. Mild basilar atelectasis on the right. Pneumonia cannot be excluded. No pneumothorax. No acute bony abnormality  IMPRESSION: 1. Mild congestive heart failure. 2. Right base subsegmental atelectasis versus mild pneumonia.    Electronically Signed   By: [place]   On: 10/12/2013 11:45    Scheduled Meds: . antiseptic oral rinse  7 mL Mouth Rinse BID  . [START ON 10/15/2013]  ceFAZolin (ANCEF) IV  3 g Intravenous On Call to OR  . cholecalciferol  1,000 Units Oral Daily  . ciprofloxacin  400 mg Intravenous Q24H  . diltiazem  240 mg Oral Daily  . metronidazole  500 mg Intravenous Q8H   Continuous Infusions: . dextrose 5 % and 0.45% NaCl 30 mL/hr at 10/13/13 1018    Principal Problem:   Acute on chronic renal failure Active Problems:   Bradycardia   Metabolic acidosis   Acute respiratory failure with hypercapnia    Time spent: 35 minutes.     Niel Hummer A  [facility] Pager [phone]. If 7PM-7AM, please contact night-coverage at [email or web address], password [facility] 10/14/2013, 9:16 AM  LOS: 2 days

## 2013-10-14 NOTE — Progress Notes (Signed)
S: Reports feeling well.  No complaints.  O:BP 155/97  Pulse 93  Temp(Src) 98 F (36.7 C) (Oral)  Resp 20  Ht 6\' 2"  (1.88 m)  Wt 272 lb 6.4 oz (123.56 kg)  BMI 34.96 kg/m2  SpO2 100%  Intake/Output Summary (Last 24 hours) at 10/14/13 1059 Last data filed at 10/14/13 0935  Gross per 24 hour  Intake    711 ml  Output    275 ml  Net    436 ml   Intake/Output: I/O last 3 completed shifts: In: 1284 [P.O.:480; I.V.:304; IV Piggyback:500] Out: 676 [Urine:675; Stool:1]  Intake/Output this shift:  Total I/O In: 240 [P.O.:240] Out: 0  Weight change: 5.6 oz (0.16 kg) [id number] in bed in NAD [id number] irregular, no m/r/g Resp:CTA B/L, no w/r/r Abd:+ BS, soft, NT,mild distention  Ext:+ bruit in LUE, trace B/L lower extremity edema, distal pulses palpable   Recent Labs Lab 10/12/13 0448 10/12/13 1155 10/12/13 1615 10/12/13 2203 10/13/13 1254 10/14/13 0535  NA 142 144 144 141 143 142  K 5.1 5.0 4.0 3.5* 4.2 4.3  CL 104 103 105 102 104 105  CO2 22 24 23 22 22 21   GLUCOSE 93 56* 63* 351* 71 83  BUN 80* 77* 75* 70* 69* 70*  CREATININE 6.90* 6.44* 6.28* 5.74* 6.09* 5.90*  ALBUMIN 2.7*  --   --   --  2.5* 2.4*  CALCIUM 9.4 9.1 9.0 8.1* 8.6 8.6  PHOS 5.5*  --   --   --  4.4 4.3  AST 77*  --   --   --   --   --   ALT 54*  --   --   --   --   --    Liver Function Tests:  Recent Labs Lab 10/12/13 0448 10/13/13 1254 10/14/13 0535  AST 77*  --   --   ALT 54*  --   --   ALKPHOS 37*  --   --   BILITOT 1.1  --   --   PROT 7.1  --   --   ALBUMIN 2.7* 2.5* 2.4*   CBC:  Recent Labs Lab 10/12/13 0448 10/12/13 1155 10/14/13 0535  WBC 5.0 5.5 5.8  NEUTROABS 3.4 4.0  --   HGB 11.3* 10.9* 11.2*  HCT 34.8* 33.2* 33.4*  MCV 90.4 90.2 86.5  PLT 138* 132* 142*   Cardiac Enzymes:  Recent Labs Lab 10/12/13 1155  TROPONINI <0.30   CBG:  Recent Labs Lab 10/13/13 0801 10/13/13 1105 10/13/13 1504 10/13/13 2139 10/14/13 0746  GLUCAP 85 83 75 179* 76    Studies/Results: US Renal  10/13/2013   CLINICAL DATA:  58 year old male with acute renal insufficiency. History of right nephrectomy. Status post ablation of to left renal masses. Initial encounter.  EXAM: RENAL/URINARY TRACT ULTRASOUND COMPLETE  COMPARISON:  Abdomen MRI 10/08/2009 and non contrast CT Abdomen and Pelvis 12/22/2005.  FINDINGS: Right Kidney:  Length: Surgically absent. In the right renal fossa there is a large simple appearing cyst measuring up to 7.8 cm diameter. This is new since 2011, and the etiology is unclear.  Left Kidney:  Length: 14.9 cm. No hydronephrosis. Multiple hypoechoic cyst. Exophytic 5.9 cm lateral left renal mass, but without vascularity on Doppler interrogation (image 11 power Doppler) common probably reflects an ablated lesion.  Bladder:  Decompressed.  Reportedly Foley catheter in place.  Other findings:    Pelvic free fluid.  IMPRESSION: 1. Solitary left kidney with no hydronephrosis. History of ablated  left renal masses. 2. 8 cm simple appearing cystic collection in the right renal fossa, was not present on the post nephrectomy comparisons in 2011 or 2007. Etiology and significance unclear. 3. Small volume pelvic free fluid.   Electronically Signed   By: Lars Pinks M.D.   On: 10/13/2013 00:55   Dg Chest Portable 1 View  10/14/2013   CLINICAL DATA:  Shortness of breath  EXAM: PORTABLE CHEST - 1 VIEW  COMPARISON:  Portable chest x-ray of October 13, 2013  FINDINGS: The right lung remains hypoinflated. Atelectasis at the lung base has improved however. The left lung is better inflated and clear. The cardiac silhouette is mildly enlarged. The pulmonary vascularity is normal.  IMPRESSION: There is been slight improvement in right basilar atelectasis. Otherwise allowing for hypoinflation and patient positioning the examination is unchanged.   Electronically Signed   By: David  Martinique   On: 10/14/2013 08:40   Dg Chest Port 1 View  10/13/2013   CLINICAL DATA:  Assess  pulmonary edema  EXAM: PORTABLE CHEST - 1 VIEW  COMPARISON:  Prior chest x-ray 10/12/2013  FINDINGS: Stable cardiac and mediastinal contours. Persistent right basilar atelectasis versus infiltrate. Mild subsegmental atelectasis also noted in the left retrocardiac region. No evidence of pulmonary edema. Atherosclerotic calcifications are present within the transverse aorta. External defibrillator pad projects the left chest. No pneumothorax.  IMPRESSION: 1. Interval resolution of mild interstitial edema. 2. Persistent right greater than left bibasilar opacities which may reflect atelectasis or infiltrate. Atelectasis is favored. 3. Stable cardiomegaly.   Electronically Signed   By: Jacqulynn Cadet M.D.   On: 10/13/2013 08:10   Dg Chest Port 1 View  10/12/2013   CLINICAL DATA:  Edema.  EXAM: PORTABLE CHEST - 1 VIEW  COMPARISON:  10/11/2013.  FINDINGS: Mediastinum and hilar structures are unremarkable. Cardiomegaly with mild pulmonary vascular prominence. Mild interstitial prominence. Mild congestive heart failure cannot be excluded. Mild basilar atelectasis on the right. Pneumonia cannot be excluded. No pneumothorax. No acute bony abnormality  IMPRESSION: 1. Mild congestive heart failure. 2. Right base subsegmental atelectasis versus mild pneumonia.   Electronically Signed   By: Marcello Moores  Register   On: 10/12/2013 11:45   . antiseptic oral rinse  7 mL Mouth Rinse BID  . [START ON 10/15/2013]  ceFAZolin (ANCEF) IV  3 g Intravenous On Call to OR  . cholecalciferol  1,000 Units Oral Daily  . ciprofloxacin  400 mg Intravenous Q24H  . diltiazem  240 mg Oral Daily  . metronidazole  500 mg Intravenous Q8H    BMET    Component Value Date/Time   NA 142 10/14/2013 0535   K 4.3 10/14/2013 0535   CL 105 10/14/2013 0535   CO2 21 10/14/2013 0535   GLUCOSE 83 10/14/2013 0535   BUN 70* 10/14/2013 0535   CREATININE 5.90* 10/14/2013 0535   CALCIUM 8.6 10/14/2013 0535   CALCIUM 9.0 03/21/2007 0415   GFRNONAA 9* 10/14/2013  0535   GFRAA 11* 10/14/2013 0535   CBC    Component Value Date/Time   WBC 5.8 10/14/2013 0535   RBC 3.86* 10/14/2013 0535   RBC 4.51 03/16/2007 0610   HGB 11.2* 10/14/2013 0535   HCT 33.4* 10/14/2013 0535   PLT 142* 10/14/2013 0535   MCV 86.5 10/14/2013 0535   MCH 29.0 10/14/2013 0535   MCHC 33.5 10/14/2013 0535   RDW 16.6* 10/14/2013 0535   LYMPHSABS 1.1 10/12/2013 1155   MONOABS 0.3 10/12/2013 1155   EOSABS 0.0 10/12/2013  1155   BASOSABS 0.0 10/12/2013 1155   Lab Results  Component Value Date   CREATININE 5.90* 10/14/2013   CREATININE 6.09* 10/13/2013   CREATININE 5.74* 10/12/2013    Assessment/Plan: 1. AKI on CKD5 - 2/2 Lasix and ACEI use in the setting of diarrhea.  Concern LUE fistula may have some stenosis.  SCr appears stable at 5.9    - Will continue to monitor renal function with BMP and UOP to determine need for HD.    - Vascular consulted and appreciate their assistance, plan for fistulogram on 08/24.   2. AG Metabolic acidosis,  - bicarb 21 AG 16. Resume home oral Sodium Bicarb 3. Hyper/hypokalemia -resolved 4. Acute hypercapnic respiratory failure: resolved, currently 100% Sat on Room air. 5. Afib - INR therapeutic; coumadin per pharmacy Bradycardia/hypotension resolved, diltiazem resumed. 6. Cardiomyopathy - EF 55% on 2D ECHO 08/21 7. Diarrhea, improving - on cipro/ flagyl, KUB ordered 8. DM - Hypoglycemia- management per primary 9. HTN - mildly hypertensive, diltiazem resumed yesterday 10. Anemia of chronic kidney disease - stable at baseline. Hgb 11.2 11. Dispo: Will need close follow up with Dr. Lowanda Foster after discharge.  Lucious Groves, DO  [facility], PGY2    I have seen and examined this patient and agree with plan as outlined by Dr. Heber [place].  Overall improved.  Avoid dual negative chronotropic agents as an outpt.  For fistulogram tomorrow and will need to f/u with Dr. Lowanda Foster after discharge as he is close to needing HD.Marland Kitchen Envi Eagleson A,MD 10/14/2013 12:53 PM

## 2013-10-15 ENCOUNTER — Encounter (HOSPITAL_COMMUNITY)
Admission: AD | Disposition: A | Discharge status: Home / Self Care | Payer: Self-pay | Source: Other Acute Inpatient Hospital | Attending: Internal Medicine

## 2013-10-15 LAB — GLUCOSE, CAPILLARY
GLUCOSE-CAPILLARY: 98 mg/dL (ref 70–99)
Glucose-Capillary: 31 mg/dL — CL (ref 70–99)
Glucose-Capillary: 176 mg/dL — ABNORMAL HIGH (ref 70–99)

## 2013-10-15 LAB — CBC
HCT: 32.4 % — ABNORMAL LOW (ref 39.0–52.0)
HEMOGLOBIN: 10.8 g/dL — AB (ref 13.0–17.0)
MCH: 29.8 pg (ref 26.0–34.0)
MCHC: 33.3 g/dL (ref 30.0–36.0)
MCV: 89.3 fL (ref 78.0–100.0)
PLATELETS: 136 10*3/uL — AB (ref 150–400)
RBC: 3.63 MIL/uL — AB (ref 4.22–5.81)
RDW: 16.6 % — ABNORMAL HIGH (ref 11.5–15.5)
WBC: 5 10*3/uL (ref 4.0–10.5)

## 2013-10-15 LAB — RENAL FUNCTION PANEL
Albumin: 2.6 g/dL — ABNORMAL LOW (ref 3.5–5.2)
Anion gap: 16 — ABNORMAL HIGH (ref 5–15)
BUN: 71 mg/dL — ABNORMAL HIGH (ref 6–23)
CALCIUM: 8.5 mg/dL (ref 8.4–10.5)
CO2: 20 meq/L (ref 19–32)
Chloride: 106 mEq/L (ref 96–112)
Creatinine, Ser: 5.92 mg/dL — ABNORMAL HIGH (ref 0.50–1.35)
GFR calc Af Amer: 11 mL/min — ABNORMAL LOW (ref >90)
GFR calc non Af Amer: 9 mL/min — ABNORMAL LOW (ref >90)
GLUCOSE: 120 mg/dL — AB (ref 70–99)
Phosphorus: 4.3 mg/dL (ref 2.3–4.6)
Potassium: 4.2 mEq/L (ref 3.7–5.3)
Sodium: 142 mEq/L (ref 137–147)

## 2013-10-15 LAB — PROTIME-INR
INR: 2.08 — ABNORMAL HIGH (ref 0.00–1.49)
PROTHROMBIN TIME: 23.4 s — AB (ref 11.6–15.2)

## 2013-10-15 SURGERY — ASSESSMENT, SHUNT FUNCTION, WITH CONTRAST RADIOGRAPHIC STUDY
Anesthesia: LOCAL | Laterality: Left

## 2013-10-15 MED ORDER — DEXTROSE 5 % IV SOLN
3.0000 g | INTRAVENOUS | Status: AC
  Administered 2013-10-16: 3 g via INTRAVENOUS
  Filled 2013-10-15: qty 3000

## 2013-10-15 MED ORDER — PHYTONADIONE 5 MG PO TABS
2.5000 mg | ORAL_TABLET | Freq: Once | ORAL | Status: AC
  Administered 2013-10-15: 2.5 mg via ORAL
  Filled 2013-10-15: qty 1

## 2013-10-15 NOTE — Progress Notes (Signed)
[facility] PROGRESS NOTE  Albert [name] [id number] DOB: Dec 16, 1955 DOA: 10/12/2013 PCP: [name], MD  Assessment/Plan: 58 year old male presented to [facility] ED 8/20 c/o dyspnea. In ED found to be bradycardic in 30's, atropine given and TC pacing applied. Noted to have worsening renal failure and acidemia. Transferred to [facility] for ICU admission.   Acute hypercarbic respiratory failure  Cont home cpap qhs  Supplemental O2 as need for SpO2 > 92% Chest x ray 8-23: slight improvement in right basilar atelectasis, right lung hypoinflation.  Incentive spirometry.  For fistulogram 8/25 depending on INR.  Will reversed INR for fistula tomorrow.   AF with bradycardia , Cardiomyopathy -Resume preadmission diltiazem at reduced dose with resolved brady (r/t hyperkalemia) -2D echo as above - some Pulm HTN,  -coumadin on hold, INR need to be less than 2 for fistulogram.   Diabetes; Hypoglycemia: has not received hypoglycemic medications. If hypoglycemia reoccurs will need to order insulin level, sulfonylurea level. Cortisol normal at 19.. Will discontinue D 5 fluids.  Continue to monitor for hypoglycemia.   Acute on chronic kidney disease: Decrease volume from GI loss ?  Hyperkalemia - Treated at [facility], resolved.  Small AG metabolic acidosis, improving.   Sepsis: was on dobutamine. Blood culture; no growth to date. Urine culture: no growth to date.  Pro calcitonin at 3.01  Diarrhea; on Cipro and flagyl day 4. Abdominal distension. Check KUB negative for obstruction. , Abdominal US positive for ascites. ascites probably related to increase volume in setting of renal failure.   Acute encephalopathy - resolved  Hx or renal ca, R nephrectomy     Code Status: Full Code.  Family Communication: Care discussed with patient.  Disposition Plan: To be determine.    Consultants:  Nephrologist  CCM   Vascular.   Procedures: Renal u/s>>> 1. Solitary left kidney with  no hydronephrosis. History of ablated left renal masses. 2. 8 cm simple appearing cystic collection in the right renal fossa, was not present on the post nephrectomy comparisons in 2011 or 2007. Etiology and significance unclear. 3. Small volume pelvic free fluid.  2D echo >> EF 55%, mod/severely dilated LA, PA press 54mmHg  Events:  8/20 admitted to ICU with worsening renal failure  8/21 off dopamine   Antibiotics:  Ciprofloxacin 8-22  Flagyl 8-21   HPI/Subjective: Feeling well, better. Relates 2 BM per day.  Feels abdomen is less distended.   Objective: Filed Vitals:   10/15/13 1032  BP: 155/98  Pulse: 102  Temp: 98.1 F (36.7 C)  Resp: 17    Intake/Output Summary (Last 24 hours) at 10/15/13 1412 Last data filed at 10/15/13 1226  Gross per 24 hour  Intake    249 ml  Output      3 ml  Net    246 ml   Filed Weights   10/13/13 0400 10/13/13 2138 10/14/13 2216  Weight: 123.4 kg (272 lb 0.8 oz) 123.56 kg (272 lb 6.4 oz) 124.1 kg (273 lb 9.5 oz)    Exam:   General:  Alert in no distress.   Cardiovascular: S 1, S 2 RRR  Respiratory: decrease breath sound, no wheezing.   Abdomen: very distended, no tenderness, no rigidity.   Musculoskeletal: trace edema  Data Reviewed: Basic Metabolic Panel:  Recent Labs Lab 10/12/13 0448  10/12/13 1615 10/12/13 2203 10/13/13 1254 10/14/13 0535 10/15/13 0553  NA 142  < > 144 141 143 142 142  K 5.1  < > 4.0 3.5* 4.2 4.3 4.2  CL 104  < > 105 102 104 105 106  CO2 22  < > 23 22 22 21 20   GLUCOSE 93  < > 63* 351* 71 83 120*  BUN 80*  < > 75* 70* 69* 70* 71*  CREATININE 6.90*  < > 6.28* 5.74* 6.09* 5.90* 5.92*  CALCIUM 9.4  < > 9.0 8.1* 8.6 8.6 8.5  MG 2.5  --   --   --   --   --   --   PHOS 5.5*  --   --   --  4.4 4.3 4.3  < > = values in this interval not displayed. Liver Function Tests:  Recent Labs Lab 10/12/13 0448 10/13/13 1254 10/14/13 0535 10/15/13 0553  AST 77*  --   --   --   ALT 54*  --   --   --    ALKPHOS 37*  --   --   --   BILITOT 1.1  --   --   --   PROT 7.1  --   --   --   ALBUMIN 2.7* 2.5* 2.4* 2.6*   No results found for this basename: LIPASE, AMYLASE,  in the last 168 hours No results found for this basename: AMMONIA,  in the last 168 hours CBC:  Recent Labs Lab 10/12/13 0448 10/12/13 1155 10/14/13 0535 10/15/13 0553  WBC 5.0 5.5 5.8 5.0  NEUTROABS 3.4 4.0  --   --   HGB 11.3* 10.9* 11.2* 10.8*  HCT 34.8* 33.2* 33.4* 32.4*  MCV 90.4 90.2 86.5 89.3  PLT 138* 132* 142* 136*   Cardiac Enzymes:  Recent Labs Lab 10/12/13 1155  TROPONINI <0.30   BNP (last 3 results) No results found for this basename: PROBNP,  in the last 8760 hours CBG:  Recent Labs Lab 10/13/13 1105 10/13/13 1504 10/13/13 2139 10/14/13 0746 10/14/13 1633  GLUCAP 83 75 179* 76 149*    Recent Results (from the past 240 hour(s))  URINE CULTURE     Status: None   Collection Time    10/12/13  4:11 AM      Result Value Ref Range Status   Specimen Description URINE, CATHETERIZED   Final   Special Requests Normal   Final   Culture  Setup Time     Final   Value: 10/12/2013 08:37     Performed at [place]     Final   Value: NO GROWTH     Performed at [facility]   Culture     Final   Value: NO GROWTH     Performed at [facility]   Report Status 10/13/2013 FINAL   Final  MRSA PCR SCREENING     Status: None   Collection Time    10/12/13  4:14 AM      Result Value Ref Range Status   MRSA by PCR NEGATIVE  NEGATIVE Final   Comment:            The GeneXpert MRSA Assay (FDA     approved for NASAL specimens     only), is one component of a     comprehensive MRSA colonization     surveillance program. It is not     intended to diagnose MRSA     infection nor to guide or     monitor treatment for     MRSA infections.  CULTURE, BLOOD (ROUTINE X 2)     Status: None  Collection Time    10/12/13  4:44 AM      Result Value Ref Range Status    Specimen Description BLOOD LEFT HAND   Final   Special Requests BOTTLES DRAWN AEROBIC ONLY 5CC   Final   Culture  Setup Time     Final   Value: 10/12/2013 08:29     Performed at [facility]   Culture     Final   Value:        BLOOD CULTURE RECEIVED NO GROWTH TO DATE CULTURE WILL BE HELD FOR 5 DAYS BEFORE ISSUING A FINAL NEGATIVE REPORT     Performed at [facility]   Report Status PENDING   Incomplete  CULTURE, BLOOD (ROUTINE X 2)     Status: None   Collection Time    10/12/13  4:48 AM      Result Value Ref Range Status   Specimen Description BLOOD LEFT HAND   Final   Special Requests BOTTLES DRAWN AEROBIC AND ANAEROBIC 5CC   Final   Culture  Setup Time     Final   Value: 10/12/2013 08:29     Performed at [facility]   Culture     Final   Value:        BLOOD CULTURE RECEIVED NO GROWTH TO DATE CULTURE WILL BE HELD FOR 5 DAYS BEFORE ISSUING A FINAL NEGATIVE REPORT     Performed at [facility]   Report Status PENDING   Incomplete     Studies: Dg Abd 1 View  10/14/2013   CLINICAL DATA:  Three weeks of abdominal swelling unresponsive to furosemide; history of right nephrectomy 15 years ago.  EXAM: ABDOMEN - 1 VIEW  COMPARISON:  None.  FINDINGS: The bowel gas pattern is nonspecific. There is no evidence of ileus or obstruction. The stool burden within the colon does not appear excessive. There are numerous surgical clips to the right of the spine from previous nephrectomy. The bony structures are unremarkable.  IMPRESSION: Nonspecific bowel gas pattern without evidence of ileus or obstruction.   Electronically Signed   By: David  Martinique   On: 10/14/2013 12:55   US Abdomen Limited  10/14/2013   CLINICAL DATA:  Assess for ascites  EXAM: LIMITED ABDOMEN ULTRASOUND FOR ASCITES  TECHNIQUE: Limited ultrasound survey for ascites was performed in all four abdominal quadrants.  COMPARISON:  Renal ultrasound of October 12, 2013.  FINDINGS: There is a moderate volume  of ascites demonstrated in the right lower quadrant of the abdomen. Floating bowel loops are visible. A small amount of fluid is present in the left lower quadrant. There is no definite left upper quadrant or right upper quadrant fluid. Incidental note is made of echogenic foci within the gallbladder.  IMPRESSION: There is a moderate volume of ascites in a right lower quadrant of the abdomen with a smaller volume on the left. The gallbladder contains echogenic foci which are floating compatible with stones.   Electronically Signed   By: David  Martinique   On: 10/14/2013 12:53   Dg Chest Portable 1 View  10/14/2013   CLINICAL DATA:  Shortness of breath  EXAM: PORTABLE CHEST - 1 VIEW  COMPARISON:  Portable chest x-ray of October 13, 2013  FINDINGS: The right lung remains hypoinflated. Atelectasis at the lung base has improved however. The left lung is better inflated and clear. The cardiac silhouette is mildly enlarged. The pulmonary vascularity is normal.  IMPRESSION: There is been slight improvement in  right basilar atelectasis. Otherwise allowing for hypoinflation and patient positioning the examination is unchanged.   Electronically Signed   By: David  Martinique   On: 10/14/2013 08:40    Scheduled Meds: . antiseptic oral rinse  7 mL Mouth Rinse BID  . [START ON 10/16/2013]  ceFAZolin (ANCEF) IV  3 g Intravenous On Call to OR  . cholecalciferol  1,000 Units Oral Daily  . ciprofloxacin  250 mg Oral Daily  . diltiazem  240 mg Oral Daily  . metroNIDAZOLE  500 mg Oral 3 times per day   Continuous Infusions: . dextrose 5 % and 0.45% NaCl 30 mL/hr at 10/15/13 1208    Principal Problem:   Acute on chronic renal failure Active Problems:   Bradycardia   Metabolic acidosis   Acute respiratory failure with hypercapnia    Time spent: 35 minutes.     Niel Hummer A  [facility] Pager [phone]. If 7PM-7AM, please contact night-coverage at [email or web address], password [facility] 10/15/2013, 2:12 PM  LOS:  3 days

## 2013-10-15 NOTE — Progress Notes (Signed)
    Lab Results  Component Value Date   INR 2.08* 10/15/2013   INR 2.33* 10/14/2013   INR 2.81* 10/13/2013   - Will need to reschedule until tomorrow - Please reversal anticoagulation to facilitate fistulogram   [name], MD [facility] [place] Office: [phone] Pager: [phone]  10/15/2013, 9:57 AM

## 2013-10-15 NOTE — Progress Notes (Signed)
S:Doing well.  Reports good UOP.  Still with diarrhea, but improving.  No N/V.  Appetite good.  O:BP 152/94  Pulse 87  Temp(Src) 98.3 F (36.8 C) (Oral)  Resp 16  Ht 6\' 2"  (1.88 m)  Wt 124.1 kg (273 lb 9.5 oz)  BMI 35.11 kg/m2  SpO2 99%  Intake/Output Summary (Last 24 hours) at 10/15/13 0840 Last data filed at 10/14/13 1730  Gross per 24 hour  Intake    720 ml  Output      1 ml  Net    719 ml   Intake/Output: I/O last 3 completed shifts: In: 720 [P.O.:720] Out: 1 [Urine:1]  Intake/Output this shift:    Weight change: 0.54 kg (1 lb 3.1 oz) Gen: sitting up chair in NAD, pleasant and cooperative with exam [id number] irregular, no m/r/g  Resp:CTA B/L, no w/r/r  Abd:+ BS, soft, NT, less distention  Ext:1+ B/L lower extremity edema   Recent Labs Lab 10/12/13 0448 10/12/13 1155 10/12/13 1615 10/12/13 2203 10/13/13 1254 10/14/13 0535 10/15/13 0553  NA 142 144 144 141 143 142 142  K 5.1 5.0 4.0 3.5* 4.2 4.3 4.2  CL 104 103 105 102 104 105 106  CO2 22 24 23 22 22 21 20   GLUCOSE 93 56* 63* 351* 71 83 120*  BUN 80* 77* 75* 70* 69* 70* 71*  CREATININE 6.90* 6.44* 6.28* 5.74* 6.09* 5.90* 5.92*  ALBUMIN 2.7*  --   --   --  2.5* 2.4* 2.6*  CALCIUM 9.4 9.1 9.0 8.1* 8.6 8.6 8.5  PHOS 5.5*  --   --   --  4.4 4.3 4.3  AST 77*  --   --   --   --   --   --   ALT 54*  --   --   --   --   --   --    Liver Function Tests:  Recent Labs Lab 10/12/13 0448 10/13/13 1254 10/14/13 0535 10/15/13 0553  AST 77*  --   --   --   ALT 54*  --   --   --   ALKPHOS 37*  --   --   --   BILITOT 1.1  --   --   --   PROT 7.1  --   --   --   ALBUMIN 2.7* 2.5* 2.4* 2.6*   CBC:  Recent Labs Lab 10/12/13 0448 10/12/13 1155 10/14/13 0535 10/15/13 0553  WBC 5.0 5.5 5.8 5.0  NEUTROABS 3.4 4.0  --   --   HGB 11.3* 10.9* 11.2* 10.8*  HCT 34.8* 33.2* 33.4* 32.4*  MCV 90.4 90.2 86.5 89.3  PLT 138* 132* 142* 136*   Cardiac Enzymes:  Recent Labs Lab 10/12/13 1155  TROPONINI <0.30    CBG:  Recent Labs Lab 10/13/13 1105 10/13/13 1504 10/13/13 2139 10/14/13 0746 10/14/13 1633  GLUCAP 83 75 179* 76 149*   Studies/Results: Dg Abd 1 View  10/14/2013   CLINICAL DATA:  Three weeks of abdominal swelling unresponsive to furosemide; history of right nephrectomy 15 years ago.  EXAM: ABDOMEN - 1 VIEW  COMPARISON:  None.  FINDINGS: The bowel gas pattern is nonspecific. There is no evidence of ileus or obstruction. The stool burden within the colon does not appear excessive. There are numerous surgical clips to the right of the spine from previous nephrectomy. The bony structures are unremarkable.  IMPRESSION: Nonspecific bowel gas pattern without evidence of ileus or obstruction.   Electronically Signed  By: David  Martinique   On: 10/14/2013 12:55   US Abdomen Limited  10/14/2013   CLINICAL DATA:  Assess for ascites  EXAM: LIMITED ABDOMEN ULTRASOUND FOR ASCITES  TECHNIQUE: Limited ultrasound survey for ascites was performed in all four abdominal quadrants.  COMPARISON:  Renal ultrasound of October 12, 2013.  FINDINGS: There is a moderate volume of ascites demonstrated in the right lower quadrant of the abdomen. Floating bowel loops are visible. A small amount of fluid is present in the left lower quadrant. There is no definite left upper quadrant or right upper quadrant fluid. Incidental note is made of echogenic foci within the gallbladder.  IMPRESSION: There is a moderate volume of ascites in a right lower quadrant of the abdomen with a smaller volume on the left. The gallbladder contains echogenic foci which are floating compatible with stones.   Electronically Signed   By: David  Martinique   On: 10/14/2013 12:53   Dg Chest Portable 1 View  10/14/2013   CLINICAL DATA:  Shortness of breath  EXAM: PORTABLE CHEST - 1 VIEW  COMPARISON:  Portable chest x-ray of October 13, 2013  FINDINGS: The right lung remains hypoinflated. Atelectasis at the lung base has improved however. The left lung is  better inflated and clear. The cardiac silhouette is mildly enlarged. The pulmonary vascularity is normal.  IMPRESSION: There is been slight improvement in right basilar atelectasis. Otherwise allowing for hypoinflation and patient positioning the examination is unchanged.   Electronically Signed   By: David  Martinique   On: 10/14/2013 08:40   . antiseptic oral rinse  7 mL Mouth Rinse BID  .  ceFAZolin (ANCEF) IV  3 g Intravenous On Call to OR  . cholecalciferol  1,000 Units Oral Daily  . ciprofloxacin  250 mg Oral Daily  . diltiazem  240 mg Oral Daily  . metroNIDAZOLE  500 mg Oral 3 times per day    BMET    Component Value Date/Time   NA 142 10/15/2013 0553   K 4.2 10/15/2013 0553   CL 106 10/15/2013 0553   CO2 20 10/15/2013 0553   GLUCOSE 120* 10/15/2013 0553   BUN 71* 10/15/2013 0553   CREATININE 5.92* 10/15/2013 0553   CALCIUM 8.5 10/15/2013 0553   CALCIUM 9.0 03/21/2007 0415   GFRNONAA 9* 10/15/2013 0553   GFRAA 11* 10/15/2013 0553   CBC    Component Value Date/Time   WBC 5.0 10/15/2013 0553   RBC 3.63* 10/15/2013 0553   RBC 4.51 03/16/2007 0610   HGB 10.8* 10/15/2013 0553   HCT 32.4* 10/15/2013 0553   PLT 136* 10/15/2013 0553   MCV 89.3 10/15/2013 0553   MCH 29.8 10/15/2013 0553   MCHC 33.3 10/15/2013 0553   RDW 16.6* 10/15/2013 0553   LYMPHSABS 1.1 10/12/2013 1155   MONOABS 0.3 10/12/2013 1155   EOSABS 0.0 10/12/2013 1155   BASOSABS 0.0 10/12/2013 1155   Lab Results  Component Value Date   CREATININE 5.92* 10/15/2013   CREATININE 5.90* 10/14/2013   CREATININE 6.09* 10/13/2013    Assessment/Plan:  1. AKI on CKD5 - 2/2 Lasix and ACEI use in the setting of diarrhea. Concern LUE fistula may have some stenosis. SCr appears stable near baseline, at 5.92 today.  His Foley is out and no UOP recorded, but he reports good UOP.             - Will continue to monitor renal function with BMP and UOP to determine need for HD.              -  Vascular consulted and appreciate their assistance, plan  for fistulogram today (08/24), however INR 2.08 and vascular prefers INR  <2.             - Dispo:  He will need close follow up with Dr. Lowanda Foster after discharge. 2. AG Metabolic acidosis, - AG 16, bicarb 20. Resume home po bicarb. 3. Hyper/hypokalemia, resolved 4.   Acute hypercapnic respiratory failure, resolved - stable on RA 5.   Afib - INR therapeutic; coumadin on hold for fistulogram        6.   Bradycardia/hypotension resolved, diltiazem resumed.  7.   Cardiomyopathy - EF 55% on 2D ECHO 08/21 8.    Diarrhea, improving - on cipro/ flagyl 9.    DM, stable - management per primary 10.  HTN, mildly hypertensive - management per primary 11.  Anemia of chronic kidney disease - stable, near baseline.   Duwaine Maxin, DO IMTS, PGY2 [phone] 10/15/2013 8:40AM

## 2013-10-15 NOTE — Progress Notes (Signed)
I have personally seen and examined this patient and agree with the assessment/plan as outlined above by Redmond Pulling DO (PGY2). Bradycardia resolved and renal function back to baseline. Awaiting fistulogram for possible eval of stenosis. No acute indications for HD based on labs/exam.    Albert Lambert K.,MD 10/15/2013 9:54 AM

## 2013-10-16 ENCOUNTER — Encounter (HOSPITAL_COMMUNITY): Payer: Self-pay | Admitting: Emergency Medicine

## 2013-10-16 LAB — RENAL FUNCTION PANEL
ALBUMIN: 2.8 g/dL — AB (ref 3.5–5.2)
Anion gap: 16 — ABNORMAL HIGH (ref 5–15)
BUN: 68 mg/dL — ABNORMAL HIGH (ref 6–23)
CO2: 18 mEq/L — ABNORMAL LOW (ref 19–32)
Calcium: 8.9 mg/dL (ref 8.4–10.5)
Chloride: 106 mEq/L (ref 96–112)
Creatinine, Ser: 5.52 mg/dL — ABNORMAL HIGH (ref 0.50–1.35)
GFR, EST AFRICAN AMERICAN: 12 mL/min — AB (ref >90)
GFR, EST NON AFRICAN AMERICAN: 10 mL/min — AB (ref >90)
Glucose, Bld: 86 mg/dL (ref 70–99)
PHOSPHORUS: 4.2 mg/dL (ref 2.3–4.6)
Potassium: 4.3 mEq/L (ref 3.7–5.3)
SODIUM: 140 meq/L (ref 137–147)

## 2013-10-16 LAB — GLUCOSE, CAPILLARY
GLUCOSE-CAPILLARY: 94 mg/dL (ref 70–99)
GLUCOSE-CAPILLARY: 151 mg/dL — AB (ref 70–99)
Glucose-Capillary: 189 mg/dL — ABNORMAL HIGH (ref 70–99)

## 2013-10-16 LAB — OCCULT BLOOD X 1 CARD TO LAB, STOOL: Fecal Occult Bld: NEGATIVE

## 2013-10-16 LAB — PROTIME-INR
INR: 1.97 — AB (ref 0.00–1.49)
Prothrombin Time: 22.4 seconds — ABNORMAL HIGH (ref 11.6–15.2)

## 2013-10-16 MED ORDER — PHYTONADIONE 5 MG PO TABS
2.5000 mg | ORAL_TABLET | Freq: Once | ORAL | Status: AC
  Administered 2013-10-16: 2.5 mg via ORAL
  Filled 2013-10-16: qty 1

## 2013-10-16 MED ORDER — SODIUM BICARBONATE 650 MG PO TABS
650.0000 mg | ORAL_TABLET | Freq: Three times a day (TID) | ORAL | Status: DC
  Administered 2013-10-16 – 2013-10-20 (×12): 650 mg via ORAL
  Filled 2013-10-16 (×15): qty 1

## 2013-10-16 MED ORDER — HYDRALAZINE HCL 25 MG PO TABS
25.0000 mg | ORAL_TABLET | Freq: Two times a day (BID) | ORAL | Status: DC
  Administered 2013-10-16 – 2013-10-17 (×3): 25 mg via ORAL
  Filled 2013-10-16 (×4): qty 1

## 2013-10-16 MED ORDER — HYDRALAZINE HCL 25 MG PO TABS
25.0000 mg | ORAL_TABLET | Freq: Three times a day (TID) | ORAL | Status: DC
  Filled 2013-10-16 (×3): qty 1

## 2013-10-16 NOTE — Progress Notes (Signed)
OT Cancellation Note  Patient Details Name: Albert [name] MRN: [id number] DOB: Feb 26, 1955   Cancelled Treatment:    Reason Eval/Treat Not Completed: OT screened, no needs identified, will sign off  Benito Mccreedy OTR/L [id number] 10/16/2013, 1:31 PM

## 2013-10-16 NOTE — Progress Notes (Signed)
   Daily Progress Note  Lab Results  Component Value Date   INR 1.97* 10/16/2013   INR 2.08* 10/15/2013   INR 2.33* 10/14/2013   - need to get INR down to 1.4/1.5 as any intervention usually involves a 6/7 Fr sheath. - will reschedule until tomorrow   [name], MD [facility] Office: [phone] Pager: [phone]  10/16/2013, 7:49 AM

## 2013-10-16 NOTE — Progress Notes (Signed)
[facility] PROGRESS NOTE  Albert [name] [id number] DOB: 1955-05-25 DOA: 10/12/2013 PCP: [name], MD  Assessment/Plan: 58 year old male presented to [facility] ED 8/20 c/o dyspnea. In ED found to be bradycardic in 30's, atropine given and TC pacing applied. Noted to have worsening renal failure and acidemia. Transferred to [facility] for ICU admission. Patient bradycardia has resolved. It was thought to be secondary to hyperkalemia. Patient cardizem was resume because of increase HR. I would continue to hold BB at this time. Regarding his CKD, renal was consulted. Acute on chronic renal failure thought to be secondary to GI looses. Patient renal function is now stable. Plan is for Fistulogram -26 if INR at 1.5. Renal is recommending close follow up with primary nephrologist. PAtient will probably required initiation of dialysis soon.  Regarding diarrhea, this has improved on Ciprofloxacin and flagyl.   Acute hypercarbic respiratory failure  Cont home cpap qhs  Supplemental O2 as need for SpO2 > 92% Chest x ray 8-23: slight improvement in right basilar atelectasis, right lung hypoinflation.  Incentive spirometry.   AF with bradycardia , Cardiomyopathy -Resume preadmission diltiazem at reduced dose with resolved brady (r/t hyperkalemia) -2D echo as above - some Pulm HTN,  -coumadin on hold, INR need to be less than 1.5 for fistulogram.   Diabetes; Hypoglycemia: has not received hypoglycemic medications. If hypoglycemia reoccurs will need to order insulin level, sulfonylurea level. Cortisol normal at 19.. Will discontinue D 5 fluids.  Continue to monitor for hypoglycemia.   Acute on chronic kidney disease: Decrease volume from GI loss ?  Hyperkalemia - Treated at [facility], resolved.  Small AG metabolic acidosis, improving.  For fistulogram 8/26 depending on INR.  Will give another dose of vitamin k.  Follow renal recommendation regarding lasix.   Sepsis: was on dobutamine.  Blood culture; no growth to date. Urine culture: no growth to date.  Pro calcitonin at 3.01  Diarrhea; on Cipro and flagyl day 5. Abdominal distension. KUB negative for obstruction. , Abdominal US positive for ascites. ascites probably related to increase volume in setting of renal failure. Abdomen less distended. Diarrhea improving.   Anemia; Follow HB trend. Check guaiac stool.   Acute encephalopathy - resolved  Hx or renal ca, R nephrectomy     Code Status: Full Code.  Family Communication: Care discussed with patient.  Disposition Plan:  fistulogram 8-26. Home soon if renal function stable and diarrhea improved.    Consultants:  Nephrologist  CCM   Vascular.   Procedures: Renal u/s>>> 1. Solitary left kidney with no hydronephrosis. History of ablated left renal masses. 2. 8 cm simple appearing cystic collection in the right renal fossa, was not present on the post nephrectomy comparisons in 2011 or 2007. Etiology and significance unclear. 3. Small volume pelvic free fluid.  2D echo >> EF 55%, mod/severely dilated LA, PA press 36mmHg  Events:  8/20 admitted to ICU with worsening renal failure  8/21 off dopamine   Antibiotics:  Ciprofloxacin 8-22  Flagyl 8-21   HPI/Subjective: Feeling better. Diarrhea better. Stool more form. Feels abdomen is less distended.   Objective: Filed Vitals:   10/16/13 1046  BP: 156/92  Pulse: 105  Temp: 98.1 F (36.7 C)  Resp: 17    Intake/Output Summary (Last 24 hours) at 10/16/13 1254 Last data filed at 10/16/13 0900  Gross per 24 hour  Intake    360 ml  Output      0 ml  Net    360 ml  Filed Weights   10/13/13 0400 10/13/13 2138 10/14/13 2216  Weight: 123.4 kg (272 lb 0.8 oz) 123.56 kg (272 lb 6.4 oz) 124.1 kg (273 lb 9.5 oz)    Exam:   General:  Alert in no distress.   Cardiovascular: S 1, S 2 RRR  Respiratory: decrease breath sound, no wheezing.   Abdomen: less  distended, no tenderness, no rigidity.    Musculoskeletal: trace edema  Data Reviewed: Basic Metabolic Panel:  Recent Labs Lab 10/12/13 0448  10/12/13 2203 10/13/13 1254 10/14/13 0535 10/15/13 0553 10/16/13 0633  NA 142  < > 141 143 142 142 140  K 5.1  < > 3.5* 4.2 4.3 4.2 4.3  CL 104  < > 102 104 105 106 106  CO2 22  < > 22 22 21 20  18*  GLUCOSE 93  < > 351* 71 83 120* 86  BUN 80*  < > 70* 69* 70* 71* 68*  CREATININE 6.90*  < > 5.74* 6.09* 5.90* 5.92* 5.52*  CALCIUM 9.4  < > 8.1* 8.6 8.6 8.5 8.9  MG 2.5  --   --   --   --   --   --   PHOS 5.5*  --   --  4.4 4.3 4.3 4.2  < > = values in this interval not displayed. Liver Function Tests:  Recent Labs Lab 10/12/13 0448 10/13/13 1254 10/14/13 0535 10/15/13 0553 10/16/13 0633  AST 77*  --   --   --   --   ALT 54*  --   --   --   --   ALKPHOS 37*  --   --   --   --   BILITOT 1.1  --   --   --   --   PROT 7.1  --   --   --   --   ALBUMIN 2.7* 2.5* 2.4* 2.6* 2.8*   No results found for this basename: LIPASE, AMYLASE,  in the last 168 hours No results found for this basename: AMMONIA,  in the last 168 hours CBC:  Recent Labs Lab 10/12/13 0448 10/12/13 1155 10/14/13 0535 10/15/13 0553  WBC 5.0 5.5 5.8 5.0  NEUTROABS 3.4 4.0  --   --   HGB 11.3* 10.9* 11.2* 10.8*  HCT 34.8* 33.2* 33.4* 32.4*  MCV 90.4 90.2 86.5 89.3  PLT 138* 132* 142* 136*   Cardiac Enzymes:  Recent Labs Lab 10/12/13 1155  TROPONINI <0.30   BNP (last 3 results) No results found for this basename: PROBNP,  in the last 8760 hours CBG:  Recent Labs Lab 10/14/13 1633 10/15/13 1745 10/15/13 2003 10/16/13 0757 10/16/13 1038  GLUCAP 149* 98 176* 94 189*    Recent Results (from the past 240 hour(s))  URINE CULTURE     Status: None   Collection Time    10/12/13  4:11 AM      Result Value Ref Range Status   Specimen Description URINE, CATHETERIZED   Final   Special Requests Normal   Final   Culture  Setup Time     Final   Value: 10/12/2013 08:37     Performed at [place]     Final   Value: NO GROWTH     Performed at [facility]   Culture     Final   Value: NO GROWTH     Performed at [facility]   Report Status 10/13/2013 FINAL   Final  MRSA PCR SCREENING     Status: None   Collection Time    10/12/13  4:14 AM      Result Value Ref Range Status   MRSA by PCR NEGATIVE  NEGATIVE Final   Comment:            The GeneXpert MRSA Assay (FDA     approved for NASAL specimens     only), is one component of a     comprehensive MRSA colonization     surveillance program. It is not     intended to diagnose MRSA     infection nor to guide or     monitor treatment for     MRSA infections.  CULTURE, BLOOD (ROUTINE X 2)     Status: None   Collection Time    10/12/13  4:44 AM      Result Value Ref Range Status   Specimen Description BLOOD LEFT HAND   Final   Special Requests BOTTLES DRAWN AEROBIC ONLY 5CC   Final   Culture  Setup Time     Final   Value: 10/12/2013 08:29     Performed at [facility]   Culture     Final   Value:        BLOOD CULTURE RECEIVED NO GROWTH TO DATE CULTURE WILL BE HELD FOR 5 DAYS BEFORE ISSUING A FINAL NEGATIVE REPORT     Performed at [facility]   Report Status PENDING   Incomplete  CULTURE, BLOOD (ROUTINE X 2)     Status: None   Collection Time    10/12/13  4:48 AM      Result Value Ref Range Status   Specimen Description BLOOD LEFT HAND   Final   Special Requests BOTTLES DRAWN AEROBIC AND ANAEROBIC 5CC   Final   Culture  Setup Time     Final   Value: 10/12/2013 08:29     Performed at [facility]   Culture     Final   Value:        BLOOD CULTURE RECEIVED NO GROWTH TO DATE CULTURE WILL BE HELD FOR 5 DAYS BEFORE ISSUING A FINAL NEGATIVE REPORT     Performed at [facility]   Report Status PENDING   Incomplete     Studies: No results found.  Scheduled Meds: . antiseptic oral rinse  7 mL Mouth Rinse BID  . cholecalciferol  1,000 Units Oral  Daily  . ciprofloxacin  250 mg Oral Daily  . diltiazem  240 mg Oral Daily  . hydrALAZINE  25 mg Oral BID  . metroNIDAZOLE  500 mg Oral 3 times per day  . sodium bicarbonate  650 mg Oral TID   Continuous Infusions:    Principal Problem:   Acute on chronic renal failure Active Problems:   Bradycardia   Metabolic acidosis   Acute respiratory failure with hypercapnia    Time spent: 35 minutes.     Niel Hummer A  [facility] Pager [phone]. If 7PM-7AM, please contact night-coverage at [email or web address], password [facility] 10/16/2013, 12:54 PM  LOS: 4 days

## 2013-10-16 NOTE — Progress Notes (Signed)
S:Doing well.  Appetite good.  UOP good.    O:BP 181/110  Pulse 103  Temp(Src) 98.2 F (36.8 C) (Oral)  Resp 18  Ht 6\' 2"  (1.88 m)  Wt 124.1 kg (273 lb 9.5 oz)  BMI 35.11 kg/m2  SpO2 95%  Intake/Output Summary (Last 24 hours) at 10/16/13 0834 Last data filed at 10/15/13 2300  Gross per 24 hour  Intake    129 ml  Output      2 ml  Net    127 ml   Intake/Output: I/O last 3 completed shifts: In: 129 [P.O.:120; I.V.:9] Out: 2 [Urine:1; Stool:1]  Intake/Output this shift:    Weight change:  Gen: sitting up on side of bed in NAD, pleasant and cooperative with exam  [id number] irregular, no m/r/g  Resp:CTA B/L, no w/r/r  Abd:+ BS, soft, NT, less distention  Ext:1+ B/L lower extremity edema   Recent Labs Lab 10/12/13 0448 10/12/13 1155 10/12/13 1615 10/12/13 2203 10/13/13 1254 10/14/13 0535 10/15/13 0553 10/16/13 0633  NA 142 144 144 141 143 142 142 140  K 5.1 5.0 4.0 3.5* 4.2 4.3 4.2 4.3  CL 104 103 105 102 104 105 106 106  CO2 22 24 23 22 22 21 20  18*  GLUCOSE 93 56* 63* 351* 71 83 120* 86  BUN 80* 77* 75* 70* 69* 70* 71* 68*  CREATININE 6.90* 6.44* 6.28* 5.74* 6.09* 5.90* 5.92* 5.52*  ALBUMIN 2.7*  --   --   --  2.5* 2.4* 2.6* 2.8*  CALCIUM 9.4 9.1 9.0 8.1* 8.6 8.6 8.5 8.9  PHOS 5.5*  --   --   --  4.4 4.3 4.3 4.2  AST 77*  --   --   --   --   --   --   --   ALT 54*  --   --   --   --   --   --   --    Liver Function Tests:  Recent Labs Lab 10/12/13 0448  10/14/13 0535 10/15/13 0553 10/16/13 0633  AST 77*  --   --   --   --   ALT 54*  --   --   --   --   ALKPHOS 37*  --   --   --   --   BILITOT 1.1  --   --   --   --   PROT 7.1  --   --   --   --   ALBUMIN 2.7*  < > 2.4* 2.6* 2.8*  < > = values in this interval not displayed. CBC:  Recent Labs Lab 10/12/13 0448 10/12/13 1155 10/14/13 0535 10/15/13 0553  WBC 5.0 5.5 5.8 5.0  NEUTROABS 3.4 4.0  --   --   HGB 11.3* 10.9* 11.2* 10.8*  HCT 34.8* 33.2* 33.4* 32.4*  MCV 90.4 90.2 86.5 89.3   PLT 138* 132* 142* 136*   Cardiac Enzymes:  Recent Labs Lab 10/12/13 1155  TROPONINI <0.30   CBG:  Recent Labs Lab 10/14/13 0746 10/14/13 1633 10/15/13 1745 10/15/13 2003 10/16/13 0757  GLUCAP 76 149* 98 176* 94    Studies/Results: Dg Abd 1 View  10/14/2013   CLINICAL DATA:  Three weeks of abdominal swelling unresponsive to furosemide; history of right nephrectomy 15 years ago.  EXAM: ABDOMEN - 1 VIEW  COMPARISON:  None.  FINDINGS: The bowel gas pattern is nonspecific. There is no evidence of ileus or obstruction. The stool burden within the colon does  not appear excessive. There are numerous surgical clips to the right of the spine from previous nephrectomy. The bony structures are unremarkable.  IMPRESSION: Nonspecific bowel gas pattern without evidence of ileus or obstruction.   Electronically Signed   By: David  Martinique   On: 10/14/2013 12:55   US Abdomen Limited  10/14/2013   CLINICAL DATA:  Assess for ascites  EXAM: LIMITED ABDOMEN ULTRASOUND FOR ASCITES  TECHNIQUE: Limited ultrasound survey for ascites was performed in all four abdominal quadrants.  COMPARISON:  Renal ultrasound of October 12, 2013.  FINDINGS: There is a moderate volume of ascites demonstrated in the right lower quadrant of the abdomen. Floating bowel loops are visible. A small amount of fluid is present in the left lower quadrant. There is no definite left upper quadrant or right upper quadrant fluid. Incidental note is made of echogenic foci within the gallbladder.  IMPRESSION: There is a moderate volume of ascites in a right lower quadrant of the abdomen with a smaller volume on the left. The gallbladder contains echogenic foci which are floating compatible with stones.   Electronically Signed   By: David  Martinique   On: 10/14/2013 12:53   . antiseptic oral rinse  7 mL Mouth Rinse BID  .  ceFAZolin (ANCEF) IV  3 g Intravenous On Call to OR  . cholecalciferol  1,000 Units Oral Daily  . ciprofloxacin  250 mg Oral  Daily  . diltiazem  240 mg Oral Daily  . metroNIDAZOLE  500 mg Oral 3 times per day    BMET    Component Value Date/Time   NA 140 10/16/2013 0633   K 4.3 10/16/2013 0633   CL 106 10/16/2013 0633   CO2 18* 10/16/2013 0633   GLUCOSE 86 10/16/2013 0633   BUN 68* 10/16/2013 0633   CREATININE 5.52* 10/16/2013 0633   CALCIUM 8.9 10/16/2013 0633   CALCIUM 9.0 03/21/2007 0415   GFRNONAA 10* 10/16/2013 0633   GFRAA 12* 10/16/2013 0633   CBC    Component Value Date/Time   WBC 5.0 10/15/2013 0553   RBC 3.63* 10/15/2013 0553   RBC 4.51 03/16/2007 0610   HGB 10.8* 10/15/2013 0553   HCT 32.4* 10/15/2013 0553   PLT 136* 10/15/2013 0553   MCV 89.3 10/15/2013 0553   MCH 29.8 10/15/2013 0553   MCHC 33.3 10/15/2013 0553   RDW 16.6* 10/15/2013 0553   LYMPHSABS 1.1 10/12/2013 1155   MONOABS 0.3 10/12/2013 1155   EOSABS 0.0 10/12/2013 1155   BASOSABS 0.0 10/12/2013 1155   Lab Results  Component Value Date   CREATININE 5.52* 10/16/2013   CREATININE 5.92* 10/15/2013   CREATININE 5.90* 10/14/2013   Assessment/Plan:  1. AKI on CKD5 - 2/2 Lasix and ACEI use in the setting of diarrhea. Concern LUE fistula may have some stenosis. SCr appears stable near baseline, at 5.52 today.  He reports good UOP.             - will likely not need HD this admission; but will need it soon as outpt             - Vascular consulted and appreciate their assistance, plan for fistulogram Tomorrow (08/26).  This was postponed due to INR.  Vascular would like INR ~ 1.4/1.5 prior to fistulogram in case of intervention and increased bleeding risk.             - Dispo: He will need close follow up with Dr. Lowanda Foster after discharge.  2. AG Metabolic acidosis, -  AG 16, bicarb 18. Resume home po bicarb. 3. Hyper/hypokalemia, resolved       4.   Acute hypercapnic respiratory failure, resolved - stable on RA        5.   Afib - INR therapeutic; coumadin on hold for fistulogram        6.   Bradycardia/hypotension resolved, diltiazem resumed.         7.   Cardiomyopathy - EF 55% on 2D ECHO 08/21        8.   Diarrhea, improving - on cipro/ flagyl        9.   DM, stable - management per primary       10.  HTN, mildly hypertensive - management per primary       11.  Anemia of chronic kidney disease - stable, near baseline.   Duwaine Maxin, DO  [facility], PGY2  [phone]  10/16/2013 8:34AM

## 2013-10-16 NOTE — Progress Notes (Signed)
I have personally seen and examined this patient and agree with the assessment/plan as outlined above by Redmond Pulling DO (PGY2). Renal function stable and awaiting fistulogram. No acute HD needs. Will restart HCO3 today. Will sign off- pls call with questions and confirm he has a f/u appt with Dr.Befekadu Laquentin Loudermilk K.,MD 10/16/2013 10:28 AM

## 2013-10-16 NOTE — Evaluation (Signed)
Physical Therapy Evaluation Patient Details Name: Albert [name] MRN: [id number] DOB: [birth year] Today's Date: 10/16/2013   History of Present Illness  58 year old male with PMH as below, which includes CKD s/p renal Ca and R nephrectomy, AF on coumadin, DM, and HTN. He has AV fistula in place but has never required HD. 8/20 he presented to [facility] ED c/o SOB, N/V, fever/chills. In ED he was found to be bradycardic. He was administered atropine and started on TC pacing. BiPAP was started for respiratory distress and acidemia. At one point his HR improved to 90s and pacer pads were removed. He was brought to [facility] ED for ICU admission.   Clinical Impression   Patient evaluated by Physical Therapy with no further acute PT needs identified. All education has been completed and the patient has no further questions.  See below for any follow-up Physical Therapy or equipment needs. PT is signing off. Thank you for this referral.  Recommend pt ambulate in hallways at least 3x/day     Follow Up Recommendations No PT follow up    Equipment Recommendations  None recommended by PT    Recommendations for Other Services       Precautions / Restrictions Precautions Precautions: None      Mobility  Bed Mobility Overal bed mobility: Independent                Transfers Overall transfer level: Independent                  Ambulation/Gait Ambulation/Gait assistance: Independent Ambulation Distance (Feet): 500 Feet Assistive device: None Gait Pattern/deviations: WFL(Within Functional Limits)     General Gait Details: No loss of balance, or signs of decr activity tolerance noted  Stairs            Wheelchair Mobility    Modified Rankin (Stroke Patients Only)       Balance Overall balance assessment: No apparent balance deficits (not formally assessed)                                           Pertinent Vitals/Pain Pain Assessment:  No/denies pain    Home Living Family/patient expects to be discharged to:: Private residence Living Arrangements: Spouse/significant other Available Help at Discharge: Other (Comment) (Friend he lives with is disabled; he cares fo rher) Type of Home: Apartment Home Access: Level entry     Home Layout: One level Home Equipment: None      Prior Function Level of Independence: Independent               Hand Dominance        Extremity/Trunk Assessment   Upper Extremity Assessment: Overall WFL for tasks assessed           Lower Extremity Assessment: Overall WFL for tasks assessed         Communication   Communication: No difficulties  Cognition Arousal/Alertness: Awake/alert Behavior During Therapy: WFL for tasks assessed/performed Overall Cognitive Status: Within Functional Limits for tasks assessed                      General Comments      Exercises        Assessment/Plan    PT Assessment Patent does not need any further PT services  PT Diagnosis     PT Problem List    PT  Treatment Interventions     PT Goals (Current goals can be found in the Care Plan section) Acute Rehab PT Goals PT Goal Formulation: No goals set, d/c therapy    Frequency     Barriers to discharge        Co-evaluation               End of Session   Activity Tolerance: Patient tolerated treatment well Patient left: in chair;with call bell/phone within reach Nurse Communication: Mobility status         Time: [id number] PT Time Calculation (min): 11 min   Charges:   PT Evaluation $Initial PT Evaluation Tier I: 1 Procedure     PT G CodesRoney Marion Hamff 10/16/2013, 11:05 AM  Roney Marion, PT  Acute Rehabilitation Services Pager [phone] Office [phone]

## 2013-10-17 DIAGNOSIS — I10 Essential (primary) hypertension: Secondary | ICD-10-CM

## 2013-10-17 LAB — RENAL FUNCTION PANEL
Albumin: 2.8 g/dL — ABNORMAL LOW (ref 3.5–5.2)
Anion gap: 17 — ABNORMAL HIGH (ref 5–15)
BUN: 64 mg/dL — AB (ref 6–23)
CHLORIDE: 108 meq/L (ref 96–112)
CO2: 16 meq/L — AB (ref 19–32)
Calcium: 9 mg/dL (ref 8.4–10.5)
Creatinine, Ser: 5.23 mg/dL — ABNORMAL HIGH (ref 0.50–1.35)
GFR calc Af Amer: 13 mL/min — ABNORMAL LOW (ref >90)
GFR calc non Af Amer: 11 mL/min — ABNORMAL LOW (ref >90)
Glucose, Bld: 109 mg/dL — ABNORMAL HIGH (ref 70–99)
Phosphorus: 3.9 mg/dL (ref 2.3–4.6)
Potassium: 4.1 mEq/L (ref 3.7–5.3)
Sodium: 141 mEq/L (ref 137–147)

## 2013-10-17 LAB — PROTIME-INR
INR: 1.92 — ABNORMAL HIGH (ref 0.00–1.49)
INR: 1.91 — AB (ref 0.00–1.49)
PROTHROMBIN TIME: 21.9 s — AB (ref 11.6–15.2)
Prothrombin Time: 22 seconds — ABNORMAL HIGH (ref 11.6–15.2)

## 2013-10-17 LAB — GLUCOSE, CAPILLARY
GLUCOSE-CAPILLARY: 160 mg/dL — AB (ref 70–99)
Glucose-Capillary: 96 mg/dL (ref 70–99)
Glucose-Capillary: 151 mg/dL — ABNORMAL HIGH (ref 70–99)
Glucose-Capillary: 140 mg/dL — ABNORMAL HIGH (ref 70–99)

## 2013-10-17 MED ORDER — PHYTONADIONE 1 MG/0.5 ML ORAL SOLUTION
1.0000 mg | Freq: Once | ORAL | Status: AC
  Administered 2013-10-17: 1 mg via ORAL
  Filled 2013-10-17: qty 0.5

## 2013-10-17 MED ORDER — HYDRALAZINE HCL 50 MG PO TABS
50.0000 mg | ORAL_TABLET | Freq: Three times a day (TID) | ORAL | Status: DC
Start: 2013-10-17 — End: 2013-10-20
  Administered 2013-10-17 – 2013-10-20 (×8): 50 mg via ORAL
  Filled 2013-10-17 (×11): qty 1

## 2013-10-17 NOTE — Progress Notes (Signed)
Nutrition Brief Note  Patient identified on the Malnutrition Screening Tool (MST) Report  Wt Readings from Last 15 Encounters:  10/17/13 272 lb 0.8 oz (123.4 kg)  10/17/13 272 lb 0.8 oz (123.4 kg)  10/17/13 272 lb 0.8 oz (123.4 kg)  05/18/13 261 lb (118.389 kg)  03/08/12 270 lb 1.3 oz (122.507 kg)  09/09/11 268 lb (121.564 kg)  03/10/11 263 lb (119.296 kg)  08/24/07 255 lb (115.667 kg)    Body mass index is 34.91 kg/(m^2). Patient meets criteria for class I obesity based on current BMI.   Current diet order is NPO in plans for a procedure later in the day Prior to NPO status, patient is consuming approximately 100% of meals at this time. Labs and medications reviewed. Pt reports he would like an education on a renal diet. Will re-visit pt for an education.  No nutrition interventions warranted at this time. If nutrition issues arise, please consult RD.   Kallie Locks, MS, Provisional LDN Pager # [phone] After hours/ weekend pager # [phone]

## 2013-10-17 NOTE — Progress Notes (Addendum)
[facility] PROGRESS NOTE  Albert [name] [id number] DOB: 1956/01/31 DOA: 10/12/2013 PCP: [name], MD  Brief HPI: 58 year old male presented to [facility] ED 8/20 c/o dyspnea. In ED found to be bradycardic in 30's, atropine given and TC pacing applied. Noted to have worsening renal failure and acidemia. Transferred to [facility] for ICU admission. Patient bradycardia has resolved. It was thought to be secondary to hyperkalemia. Patient cardizem was resumed because of increase HR. Continue to hold BB at this time. Regarding his CKD, Nephrology was consulted. Acute on chronic renal failure thought to be secondary to GI looses. Patient's renal function is now stable. Plan is for Fistulogram once INR at 1.5. Renal is recommending close follow up with primary nephrologist. Patient will probably required initiation of dialysis soon. Regarding diarrhea, this has improved on Ciprofloxacin and flagyl.   Assessment/Plan: AF with bradycardia , Cardiomyopathy -Now on diltiazem at reduced dose.  -2D echo as below- some Pulm HTN,  -coumadin on hold, INR need to be less than 1.5 for fistulogram.   Acute hypercarbic respiratory failure  Stable. Cont home cpap qhs  Supplemental O2 as need for SpO2 > 92% Chest x ray 8-23: slight improvement in right basilar atelectasis, right lung hypoinflation.  Incentive spirometry.   Diabetes; Hypoglycemia Had not received any diabetic medications. CBG is better.   Acute on chronic kidney disease Likely due to decrease volume from GI loss ?  Hyperkalemia - Treated at [facility], resolved.  Small AG metabolic acidosis, improving.  For fistulogram 8/27 depending on INR.  Will need to go back on home Lasix dose per Nephrology. Per patient he was not on scheduled dose of Lasix. Will determine dose when ready for discharge.  Sepsis Was on dobutamine. Blood culture; no growth to date. Urine culture: no growth to date.  Pro calcitonin at  3.01  Diarrhea Etiology unclear. On Cipro and flagyl. KUB negative for obstruction. Abdominal US positive for ascites. Ascites probably related to increase volume in setting of renal failure. He is better.   Anemia Hgb stable  Acute encephalopathy Resolved  Hx or renal ca H/o R nephrectomy   DVT Prophylaxis: SCD's Code Status: Full Code.  Family Communication: Care discussed with patient.  Disposition Plan:  fistulogram 8-27. DC 1-2 days   Consultants:  Nephrologist  CCM   Vascular.   Procedures: Renal u/s>>> 1. Solitary left kidney with no hydronephrosis. History of ablated left renal masses. 2. 8 cm simple appearing cystic collection in the right renal fossa, was not present on the post nephrectomy comparisons in 2011 or 2007. Etiology and significance unclear. 3. Small volume pelvic free fluid.   2D echo >> EF 55%, mod/severely dilated LA, PA press 56mmHg  Events:  8/20 admitted to ICU with worsening renal failure  8/21 off dopamine  Antibiotics:  Ciprofloxacin 8-22  Flagyl 8-21   HPI/Subjective: Continues to improve. Asking when he would be able to go home. Diarrhea is better.  Objective: Filed Vitals:   10/17/13 0900  BP: 165/98  Pulse: 93  Temp: 98 F (36.7 C)  Resp: 19    Intake/Output Summary (Last 24 hours) at 10/17/13 1447 Last data filed at 10/17/13 0900  Gross per 24 hour  Intake      0 ml  Output      0 ml  Net      0 ml   Filed Weights   10/13/13 2138 10/14/13 2216 10/17/13 0500  Weight: 123.56 kg (272 lb 6.4 oz) 124.1 kg (273  lb 9.5 oz) 123.4 kg (272 lb 0.8 oz)    Exam:   General:  Alert in no distress.   Cardiovascular: S 1, S 2 RRR  Respiratory: no crackles, no wheezing.   Abdomen: less  distended, no tenderness, no rigidity.   Musculoskeletal: trace edema  Data Reviewed: Basic Metabolic Panel:  Recent Labs Lab 10/12/13 0448  10/13/13 1254 10/14/13 0535 10/15/13 0553 10/16/13 0633 10/17/13 0559  NA 142  < >  143 142 142 140 141  K 5.1  < > 4.2 4.3 4.2 4.3 4.1  CL 104  < > 104 105 106 106 108  CO2 22  < > 22 21 20  18* 16*  GLUCOSE 93  < > 71 83 120* 86 109*  BUN 80*  < > 69* 70* 71* 68* 64*  CREATININE 6.90*  < > 6.09* 5.90* 5.92* 5.52* 5.23*  CALCIUM 9.4  < > 8.6 8.6 8.5 8.9 9.0  MG 2.5  --   --   --   --   --   --   PHOS 5.5*  --  4.4 4.3 4.3 4.2 3.9  < > = values in this interval not displayed. Liver Function Tests:  Recent Labs Lab 10/12/13 0448 10/13/13 1254 10/14/13 0535 10/15/13 0553 10/16/13 0633 10/17/13 0559  AST 77*  --   --   --   --   --   ALT 54*  --   --   --   --   --   ALKPHOS 37*  --   --   --   --   --   BILITOT 1.1  --   --   --   --   --   PROT 7.1  --   --   --   --   --   ALBUMIN 2.7* 2.5* 2.4* 2.6* 2.8* 2.8*   CBC:  Recent Labs Lab 10/12/13 0448 10/12/13 1155 10/14/13 0535 10/15/13 0553  WBC 5.0 5.5 5.8 5.0  NEUTROABS 3.4 4.0  --   --   HGB 11.3* 10.9* 11.2* 10.8*  HCT 34.8* 33.2* 33.4* 32.4*  MCV 90.4 90.2 86.5 89.3  PLT 138* 132* 142* 136*   Cardiac Enzymes:  Recent Labs Lab 10/12/13 1155  TROPONINI <0.30   CBG:  Recent Labs Lab 10/15/13 2003 10/16/13 0757 10/16/13 1038 10/16/13 1709 10/17/13 1145  GLUCAP 176* 94 189* 151* 96    Recent Results (from the past 240 hour(s))  URINE CULTURE     Status: None   Collection Time    10/12/13  4:11 AM      Result Value Ref Range Status   Specimen Description URINE, CATHETERIZED   Final   Special Requests Normal   Final   Culture  Setup Time     Final   Value: 10/12/2013 08:37     Performed at [place]     Final   Value: NO GROWTH     Performed at [facility]   Culture     Final   Value: NO GROWTH     Performed at [facility]   Report Status 10/13/2013 FINAL   Final  MRSA PCR SCREENING     Status: None   Collection Time    10/12/13  4:14 AM      Result Value Ref Range Status   MRSA by PCR NEGATIVE  NEGATIVE Final   Comment:  The GeneXpert MRSA Assay (FDA     approved for NASAL specimens     only), is one component of a     comprehensive MRSA colonization     surveillance program. It is not     intended to diagnose MRSA     infection nor to guide or     monitor treatment for     MRSA infections.  CULTURE, BLOOD (ROUTINE X 2)     Status: None   Collection Time    10/12/13  4:44 AM      Result Value Ref Range Status   Specimen Description BLOOD LEFT HAND   Final   Special Requests BOTTLES DRAWN AEROBIC ONLY 5CC   Final   Culture  Setup Time     Final   Value: 10/12/2013 08:29     Performed at [facility]   Culture     Final   Value:        BLOOD CULTURE RECEIVED NO GROWTH TO DATE CULTURE WILL BE HELD FOR 5 DAYS BEFORE ISSUING A FINAL NEGATIVE REPORT     Performed at [facility]   Report Status PENDING   Incomplete  CULTURE, BLOOD (ROUTINE X 2)     Status: None   Collection Time    10/12/13  4:48 AM      Result Value Ref Range Status   Specimen Description BLOOD LEFT HAND   Final   Special Requests BOTTLES DRAWN AEROBIC AND ANAEROBIC 5CC   Final   Culture  Setup Time     Final   Value: 10/12/2013 08:29     Performed at [facility]   Culture     Final   Value:        BLOOD CULTURE RECEIVED NO GROWTH TO DATE CULTURE WILL BE HELD FOR 5 DAYS BEFORE ISSUING A FINAL NEGATIVE REPORT     Performed at [facility]   Report Status PENDING   Incomplete     Studies: No results found.  Scheduled Meds: . antiseptic oral rinse  7 mL Mouth Rinse BID  . cholecalciferol  1,000 Units Oral Daily  . ciprofloxacin  250 mg Oral Daily  . diltiazem  240 mg Oral Daily  . hydrALAZINE  25 mg Oral BID  . metroNIDAZOLE  500 mg Oral 3 times per day  . sodium bicarbonate  650 mg Oral TID   Continuous Infusions:    Principal Problem:   Acute on chronic renal failure Active Problems:   Bradycardia   Metabolic acidosis   Acute respiratory failure with hypercapnia    Time  spent: 35 minutes.     [facility] Pager [phone].   If 7PM-7AM, please contact night-coverage at [email or web address], password [facility] 10/17/2013, 2:47 PM  LOS: 5 days

## 2013-10-17 NOTE — Progress Notes (Signed)
Patient's INR remains elevated at 1.92. Per Dr. Lianne Moris note waiting for INR to be below 1.5. We'll cancel shuntogram for today. Rescheduled for tomorrow pending INR

## 2013-10-17 NOTE — Progress Notes (Signed)
Note/chart reviewed.  Katie Timouthy Gilardi, RD, LDN Pager #: [phone] After-Hours Pager #: [phone]  

## 2013-10-17 NOTE — Progress Notes (Signed)
        INR  1.92 04/19/2013 - need to get INR down to 1.4/1.5 as any intervention usually involves a 6/7 Fr sheath.  Plan: Ordered 1mg  Vit. K and will check PT/INR at 12 noon.   COLLINS, EMMA MAUREEN

## 2013-10-17 NOTE — H&P (View-Only) (Signed)
CONSULT NOTE   MRN : [id number]  Reason for Consult: Fistula stenosis  Referring Physician:   History of Present Illness: 58 y/o male with CKD CR 6.90.  Reported to have fistula stenosis. Placement of new left upper arm arteriovenous fistula in 2009 by Dr. [name] and has never been on dialysis.  He was admitted secondary to dyspnea and bradycardia in the 30's.  His past medical history includes:   dyslipidemia  Treated with Pravachol, hypertension treated with Carvedilol, DM treated with insulin and A-fib currently on coumadin.  INR is 2.38 today.       Current Facility-Administered Medications  Medication Dose Route Frequency Provider Last Rate Last Dose  . 0.9 %  sodium chloride infusion  250 mL Intravenous PRN [name], MD      . ciprofloxacin (CIPRO) IVPB 400 mg  400 mg Intravenous Once Raylene Miyamoto, MD      . DOPamine (INTROPIN) 800 mg in dextrose 5 % 250 mL (3.2 mg/mL) infusion  2-20 mcg/kg/min Intravenous Continuous Corey Harold, NP   2 mcg/kg/min at 10/12/13 0543  . hydrALAZINE (APRESOLINE) injection 10 mg  10 mg Intravenous Q4H PRN Raylene Miyamoto, MD      . insulin aspart (novoLOG) injection 2-6 Units  2-6 Units Subcutaneous 6 times per day Corey Harold, NP      . metroNIDAZOLE (FLAGYL) IVPB 500 mg  500 mg Intravenous Q8H Raylene Miyamoto, MD   500 mg at 10/12/13 1210  . sodium bicarbonate 150 mEq in sterile water 1,000 mL infusion   Intravenous Continuous Corey Harold, NP 100 mL/hr at 10/12/13 0403    . warfarin (COUMADIN) tablet 1.25 mg  1.25 mg Oral Q M,W,F-1800 Narda Bonds, RPH      . [START ON 10/13/2013] warfarin (COUMADIN) tablet 2.5 mg  2.5 mg Oral Q T,Th,S,Su-1800 Narda Bonds, [facility]      . Warfarin - Pharmacist Dosing Inpatient   Does not apply q1800 Narda Bonds, [place]        Pt meds include: Statin :Yes Betablocker: No ASA: Yes Other anticoagulants/antiplatelets: Coumadin  Past Medical History  Diagnosis Date  . Renal cell cancer     . Mixed hyperlipidemia   . Type 2 diabetes mellitus   . Paroxysmal atrial fibrillation   . Essential hypertension, benign   . Chronic renal insufficiency     Never required dialysis - Dr. Lowanda Foster  . Gout   . Diverticulosis of colon   . Colonic polyp   . Cardiomyopathy     LVEF 50-55% 8/11 - [facility]  . OSA (obstructive sleep apnea)     CPAP    Past Surgical History  Procedure Laterality Date  . Right nephrectomy    . Left arm av fistula  2009    Dr. [name]    Social History History  Substance Use Topics  . Smoking status: Former Smoker    Types: Cigarettes  . Smokeless tobacco: Never Used  . Alcohol Use: Yes     Comment: Occasional    Family History Family History  Problem Relation Age of Onset  . Hypertension      Siblings  . Diabetes type II      Siblings    No Known Allergies   REVIEW OF SYSTEMS  General: [ ]  Weight loss, [ ]  Fever, [ ]  chills Neurologic: [ ]  Dizziness, [ ]  Blackouts, [ ]  Seizure [ ]  Stroke, [ ]  "Mini stroke", [ ]  Slurred speech, [ ]   Temporary blindness; [ ]  weakness in arms or legs, [ ]  Hoarseness [ ]  Dysphagia Cardiac: [ ]  Chest pain/pressure, [x ] Shortness of breath at rest [ ]  Shortness of breath with exertion, [ ]  Atrial fibrillation or irregular heartbeat  Vascular: [ ]  Pain in legs with walking, [ ]  Pain in legs at rest, [ ]  Pain in legs at night,  [ ]  Non-healing ulcer, [ ]  Blood clot in vein/DVT,   Pulmonary: [ ]  Home oxygen, [ ]  Productive cough, [ ]  Coughing up blood, [ ]  Asthma,  [ ]  Wheezing [ ]  COPD Musculoskeletal:  [ ]  Arthritis, [ ]  Low back pain, [ ]  Joint pain Hematologic: [ ]  Easy Bruising, [ ]  Anemia; [ ]  Hepatitis Gastrointestinal: [ ]  Blood in stool, [ ]  Gastroesophageal Reflux/heartburn, Urinary: [ x] chronic Kidney disease, [ ]  on HD - [ ]  MWF or [ ]  TTHS, [ ]  Burning with urination, [ ]  Difficulty urinating Skin: [ ]  Rashes, [ ]  Wounds Psychological: [ ]  Anxiety, [ ]  Depression  Physical Examination Filed  Vitals:   10/12/13 0800 10/12/13 0900 10/12/13 0935 10/12/13 1000  BP: 151/85 152/79  163/91  Pulse: 65 77 73 87  Temp:      TempSrc:      Resp: 15 14 19 19   Height:      Weight:      SpO2: 100% 100% 100% 100%   Body mass index is 34.49 kg/(m^2).  General:  WDWN in NAD HENT: WNL Eyes: Pupils equal Pulmonary: normal non-labored breathing , without Rales, rhonchi,  wheezing Cardiac: irregularly irregular, without  Murmurs, rubs or gallops; Abdomen: soft, NT, no masses Skin: no rashes, ulcers noted;  no Gangrene , no cellulitis; no open wounds;   Vascular Exam/Pulses:Palpable thrill in the left upper arm distal 1/2 of the fistula, tortuous  proximal fistula with decreased thrill.  Palpable radial pulse   Musculoskeletal: no muscle wasting or atrophy;  Neurologic: A&O X 3; Appropriate Affect ;  SENSATION: normal; MOTOR FUNCTION: 5/5 Symmetric Speech is fluent/normal   Significant Diagnostic Studies: CBC Lab Results  Component Value Date   WBC 5.0 10/12/2013   HGB 11.3* 10/12/2013   HCT 34.8* 10/12/2013   MCV 90.4 10/12/2013   PLT 138* 10/12/2013    BMET    Component Value Date/Time   NA 142 10/12/2013 0448   K 5.1 10/12/2013 0448   CL 104 10/12/2013 0448   CO2 22 10/12/2013 0448   GLUCOSE 93 10/12/2013 0448   BUN 80* 10/12/2013 0448   CREATININE 6.90* 10/12/2013 0448   CALCIUM 9.4 10/12/2013 0448   CALCIUM 9.0 03/21/2007 0415   GFRNONAA 8* 10/12/2013 0448   GFRAA 9* 10/12/2013 0448   Estimated Creatinine Clearance: 16 ml/min (by C-G formula based on Cr of 6.9).  COAG Lab Results  Component Value Date   INR 2.38* 10/12/2013   INR 2.1 10/11/2013   INR 4.4 09/27/2013       ASSESSMENT/PLAN:  CKD not currently on dialysis Decreased thrill in the left AV fistula Plan Fistulogram Mon. By Dr. Cloyde Reams, EMMA [facility] 10/12/2013 12:15 PM  I agree with the above.  The patient is now nearing the initiation of dialysis.  He has a left upper arm fistula placed in 2009.   The fistula has matured nicely in the distal half of the upper arm.  It appears to be very tortuous in the proximal upper arm.  There is a change in the caliber of the  thrill, in the upper arm.  I have recommended proceeding with a fistulogram to fully evaluate the fistula to see if any revisions need to be performed prior to utilization.  This will be scheduled for Monday.  The patient will need to have an INR less than 2 before proceeding.  Please stop his anticoagulation.  Annamarie Major

## 2013-10-17 NOTE — Interval H&P Note (Signed)
History and Physical Interval Note:  10/17/2013 12:08 PM  Albert [name]  has presented today for surgery, with the diagnosis of claudication  The various methods of treatment have been discussed with the patient and family. After consideration of risks, benefits and other options for treatment, the patient has consented to  Procedure(s): FISTULOGRAM (N/A) as a surgical intervention .  The patient's history has been reviewed, patient examined, no change in status, stable for surgery.  I have reviewed the patient's chart and labs.  Questions were answered to the patient's satisfaction.     Gao Mitnick

## 2013-10-17 NOTE — Progress Notes (Deleted)
[facility] PROGRESS NOTE  Albert [name] [id number] DOB: [birth year] DOA: 10/12/2013 PCP: [name], MD  Brief HPI: 58 year old male presented to [facility] ED 8/20 c/o dyspnea. In ED found to be bradycardic in 30's, atropine given and TC pacing applied. Noted to have worsening renal failure and acidemia. Transferred to [facility] for ICU admission. Patient bradycardia has resolved. It was thought to be secondary to hyperkalemia. Patient cardizem was resumed because of increase HR. Continue to hold BB at this time. Regarding his CKD, Nephrology was consulted. Acute on chronic renal failure thought to be secondary to GI looses. Patient's renal function is now stable. Plan is for Fistulogram once INR at 1.5. Renal is recommending close follow up with primary nephrologist. Patient will probably required initiation of dialysis soon. Regarding diarrhea, this has improved on Ciprofloxacin and flagyl.   Assessment/Plan: AF with bradycardia , Cardiomyopathy -Now on diltiazem at reduced dose.  -2D echo as below- some Pulm HTN,  -coumadin on hold, INR need to be less than 1.5 for fistulogram.   Acute hypercarbic respiratory failure  Stable. Cont home cpap qhs  Supplemental O2 as need for SpO2 > 92% Chest x ray 8-23: slight improvement in right basilar atelectasis, right lung hypoinflation.  Incentive spirometry.   Diabetes; Hypoglycemia Had not received any diabetic medications. CBG is better.   Acute on chronic kidney disease Likely due to decrease volume from GI loss ?  Hyperkalemia - Treated at [facility], resolved.  Small AG metabolic acidosis, improving.  For fistulogram 8/27 depending on INR.  Will need to go back on home Lasix dose per Nephrology. Per patient he was not on scheduled dose of Lasix. Will determine dose when ready for discharge.  Sepsis Was on dobutamine. Blood culture; no growth to date. Urine culture: no growth to date.  Pro calcitonin at  3.01  Diarrhea Etiology unclear. On Cipro and flagyl. KUB negative for obstruction. Abdominal US positive for ascites. Ascites probably related to increase volume in setting of renal failure. He is better.   Anemia Hgb stable  Acute encephalopathy Resolved  Hx or renal ca H/o R nephrectomy   DVT Prophylaxis: SCD's Code Status: Full Code.  Family Communication: Care discussed with patient.  Disposition Plan:  fistulogram 8-27. DC 1-2 days   Consultants:  Nephrologist  CCM   Vascular.   Procedures: Renal u/s>>> 1. Solitary left kidney with no hydronephrosis. History of ablated left renal masses. 2. 8 cm simple appearing cystic collection in the right renal fossa, was not present on the post nephrectomy comparisons in 2011 or 2007. Etiology and significance unclear. 3. Small volume pelvic free fluid.   2D echo >> EF 55%, mod/severely dilated LA, PA press 82mmHg  Events:  8/20 admitted to ICU with worsening renal failure  8/21 off dopamine  Antibiotics:  Ciprofloxacin 8-22  Flagyl 8-21   HPI/Subjective: Continues to improve. Asking when he would be able to go home. Diarrhea is better.  Objective: Filed Vitals:   10/17/13 0900  BP: 165/98  Pulse: 93  Temp: 98 F (36.7 C)  Resp: 19    Intake/Output Summary (Last 24 hours) at 10/17/13 1633 Last data filed at 10/17/13 0900  Gross per 24 hour  Intake      0 ml  Output      0 ml  Net      0 ml   Filed Weights   10/13/13 2138 10/14/13 2216 10/17/13 0500  Weight: 123.56 kg (272 lb 6.4 oz) 124.1 kg (273  lb 9.5 oz) 123.4 kg (272 lb 0.8 oz)    Exam:   General:  Alert in no distress.   Cardiovascular: S 1, S 2 RRR  Respiratory: no crackles, no wheezing.   Abdomen: less  distended, no tenderness, no rigidity.   Musculoskeletal: trace edema  Data Reviewed: Basic Metabolic Panel:  Recent Labs Lab 10/12/13 0448  10/13/13 1254 10/14/13 0535 10/15/13 0553 10/16/13 0633 10/17/13 0559  NA 142  < >  143 142 142 140 141  K 5.1  < > 4.2 4.3 4.2 4.3 4.1  CL 104  < > 104 105 106 106 108  CO2 22  < > 22 21 20  18* 16*  GLUCOSE 93  < > 71 83 120* 86 109*  BUN 80*  < > 69* 70* 71* 68* 64*  CREATININE 6.90*  < > 6.09* 5.90* 5.92* 5.52* 5.23*  CALCIUM 9.4  < > 8.6 8.6 8.5 8.9 9.0  MG 2.5  --   --   --   --   --   --   PHOS 5.5*  --  4.4 4.3 4.3 4.2 3.9  < > = values in this interval not displayed. Liver Function Tests:  Recent Labs Lab 10/12/13 0448 10/13/13 1254 10/14/13 0535 10/15/13 0553 10/16/13 0633 10/17/13 0559  AST 77*  --   --   --   --   --   ALT 54*  --   --   --   --   --   ALKPHOS 37*  --   --   --   --   --   BILITOT 1.1  --   --   --   --   --   PROT 7.1  --   --   --   --   --   ALBUMIN 2.7* 2.5* 2.4* 2.6* 2.8* 2.8*   CBC:  Recent Labs Lab 10/12/13 0448 10/12/13 1155 10/14/13 0535 10/15/13 0553  WBC 5.0 5.5 5.8 5.0  NEUTROABS 3.4 4.0  --   --   HGB 11.3* 10.9* 11.2* 10.8*  HCT 34.8* 33.2* 33.4* 32.4*  MCV 90.4 90.2 86.5 89.3  PLT 138* 132* 142* 136*   Cardiac Enzymes:  Recent Labs Lab 10/12/13 1155  TROPONINI <0.30   CBG:  Recent Labs Lab 10/15/13 2003 10/16/13 0757 10/16/13 1038 10/16/13 1709 10/17/13 1145  GLUCAP 176* 94 189* 151* 96    Recent Results (from the past 240 hour(s))  URINE CULTURE     Status: None   Collection Time    10/12/13  4:11 AM      Result Value Ref Range Status   Specimen Description URINE, CATHETERIZED   Final   Special Requests Normal   Final   Culture  Setup Time     Final   Value: 10/12/2013 08:37     Performed at [place]     Final   Value: NO GROWTH     Performed at [facility]   Culture     Final   Value: NO GROWTH     Performed at [facility]   Report Status 10/13/2013 FINAL   Final  MRSA PCR SCREENING     Status: None   Collection Time    10/12/13  4:14 AM      Result Value Ref Range Status   MRSA by PCR NEGATIVE  NEGATIVE Final   Comment:  The GeneXpert MRSA Assay (FDA     approved for NASAL specimens     only), is one component of a     comprehensive MRSA colonization     surveillance program. It is not     intended to diagnose MRSA     infection nor to guide or     monitor treatment for     MRSA infections.  CULTURE, BLOOD (ROUTINE X 2)     Status: None   Collection Time    10/12/13  4:44 AM      Result Value Ref Range Status   Specimen Description BLOOD LEFT HAND   Final   Special Requests BOTTLES DRAWN AEROBIC ONLY 5CC   Final   Culture  Setup Time     Final   Value: 10/12/2013 08:29     Performed at [facility]   Culture     Final   Value:        BLOOD CULTURE RECEIVED NO GROWTH TO DATE CULTURE WILL BE HELD FOR 5 DAYS BEFORE ISSUING A FINAL NEGATIVE REPORT     Performed at [facility]   Report Status PENDING   Incomplete  CULTURE, BLOOD (ROUTINE X 2)     Status: None   Collection Time    10/12/13  4:48 AM      Result Value Ref Range Status   Specimen Description BLOOD LEFT HAND   Final   Special Requests BOTTLES DRAWN AEROBIC AND ANAEROBIC 5CC   Final   Culture  Setup Time     Final   Value: 10/12/2013 08:29     Performed at [facility]   Culture     Final   Value:        BLOOD CULTURE RECEIVED NO GROWTH TO DATE CULTURE WILL BE HELD FOR 5 DAYS BEFORE ISSUING A FINAL NEGATIVE REPORT     Performed at [facility]   Report Status PENDING   Incomplete     Studies: No results found.  Scheduled Meds: . antiseptic oral rinse  7 mL Mouth Rinse BID  . cholecalciferol  1,000 Units Oral Daily  . ciprofloxacin  250 mg Oral Daily  . diltiazem  240 mg Oral Daily  . hydrALAZINE  50 mg Oral 3 times per day  . metroNIDAZOLE  500 mg Oral 3 times per day  . sodium bicarbonate  650 mg Oral TID   Continuous Infusions:    Principal Problem:   Acute on chronic renal failure Active Problems:   Bradycardia   Metabolic acidosis   Acute respiratory failure with  hypercapnia    Time spent: 35 minutes.     [place] [facility] Pager [phone].   If 7PM-7AM, please contact night-coverage at [email or web address], password [facility] 10/17/2013, 4:33 PM  LOS: 5 days

## 2013-10-18 LAB — RENAL FUNCTION PANEL
ALBUMIN: 2.6 g/dL — AB (ref 3.5–5.2)
Anion gap: 17 — ABNORMAL HIGH (ref 5–15)
BUN: 62 mg/dL — AB (ref 6–23)
CO2: 17 mEq/L — ABNORMAL LOW (ref 19–32)
CREATININE: 5.08 mg/dL — AB (ref 0.50–1.35)
Calcium: 9 mg/dL (ref 8.4–10.5)
Chloride: 110 mEq/L (ref 96–112)
GFR calc Af Amer: 13 mL/min — ABNORMAL LOW (ref >90)
GFR calc non Af Amer: 11 mL/min — ABNORMAL LOW (ref >90)
GLUCOSE: 110 mg/dL — AB (ref 70–99)
PHOSPHORUS: 4 mg/dL (ref 2.3–4.6)
Potassium: 4.1 mEq/L (ref 3.7–5.3)
Sodium: 144 mEq/L (ref 137–147)

## 2013-10-18 LAB — CULTURE, BLOOD (ROUTINE X 2)
CULTURE: NO GROWTH
Culture: NO GROWTH

## 2013-10-18 LAB — GLUCOSE, CAPILLARY
GLUCOSE-CAPILLARY: 182 mg/dL — AB (ref 70–99)
Glucose-Capillary: 191 mg/dL — ABNORMAL HIGH (ref 70–99)
Glucose-Capillary: 111 mg/dL — ABNORMAL HIGH (ref 70–99)
Glucose-Capillary: 102 mg/dL — ABNORMAL HIGH (ref 70–99)
Glucose-Capillary: 118 mg/dL — ABNORMAL HIGH (ref 70–99)

## 2013-10-18 LAB — PROTIME-INR
INR: 1.95 — ABNORMAL HIGH (ref 0.00–1.49)
PROTHROMBIN TIME: 22.2 s — AB (ref 11.6–15.2)

## 2013-10-18 MED ORDER — VITAMIN K1 10 MG/ML IJ SOLN
5.0000 mg | Freq: Once | INTRAVENOUS | Status: AC
  Administered 2013-10-18: 5 mg via INTRAVENOUS
  Filled 2013-10-18: qty 0.5

## 2013-10-18 MED ORDER — FUROSEMIDE 80 MG PO TABS
80.0000 mg | ORAL_TABLET | Freq: Every day | ORAL | Status: DC
  Administered 2013-10-18: 80 mg via ORAL
  Filled 2013-10-18 (×2): qty 1

## 2013-10-18 MED ORDER — CARVEDILOL 6.25 MG PO TABS
6.2500 mg | ORAL_TABLET | Freq: Two times a day (BID) | ORAL | Status: DC
  Administered 2013-10-18 – 2013-10-20 (×5): 6.25 mg via ORAL
  Filled 2013-10-18 (×7): qty 1

## 2013-10-18 NOTE — Plan of Care (Signed)
Note/chart reviewed.  Katie Chery Giusto, RD, LDN Pager #: [phone] After-Hours Pager #: [phone]  

## 2013-10-18 NOTE — Progress Notes (Signed)
Late Entry:  @ 21:55 RT spoke with patient about wearing CPAP.  Patient refused at this time.  Encouraged to call for Respiratory if he would like to wear CPAP.

## 2013-10-18 NOTE — Plan of Care (Signed)
Problem: Food- and Nutrition-Related Knowledge Deficit (NB-1.1) Goal: Nutrition education Formal process to instruct or train a patient/client in a skill or to impart knowledge to help patients/clients voluntarily manage or modify food choices and eating behavior to maintain or improve health. Outcome: Completed/Met Date Met:  10/18/13 Nutrition Education Note  Pt requests a renal diet education. Provided Food Pyramid for Healthy Eating with Kidney disease. Reviewed food groups and provided written recommended serving sizes specifically determined for patient's current nutritional status.   Explained why diet restrictions are needed and provided lists of foods to limit/avoid that are high potassium, sodium, and phosphorus. Provided specific recommendations on safer alternatives of these foods. Strongly encouraged compliance of this diet.   Discussed importance of protein intake at each meal and snack. Discussed renal friendly breakfast options as pt specifically questioned about it. Discussed need for fluid restriction and renal-friendly beverage options. Teach back method used.  Expect good compliance.  Body mass index is 34.97 kg/(m^2). Pt meets criteria for class I obesity based on current BMI.  Current diet order is renal with 1200 ml fluids, patient is consuming approximately 100% of meals at this time. Labs and medications reviewed. No further nutrition interventions warranted at this time. RD contact information provided. If additional nutrition issues arise, please re-consult RD.  Kallie Locks, MS, Provisional LDN Pager # [phone] After hours/ weekend pager # [phone]

## 2013-10-18 NOTE — Progress Notes (Signed)
[facility] PROGRESS NOTE  Albert [name] [id number] DOB: 05/15/1955 DOA: 10/12/2013 PCP: [name], MD  Brief HPI: 58 year old male presented to [facility] ED 8/20 c/o dyspnea. In ED found to be bradycardic in 30's, atropine given and TC pacing applied. Noted to have worsening renal failure and acidemia. Transferred to [facility] for ICU admission. Patient bradycardia has resolved. It was thought to be secondary to hyperkalemia. Patient cardizem was resumed because of increase HR. Continue to hold BB at this time. Regarding his CKD, Nephrology was consulted. Acute on chronic renal failure thought to be secondary to GI looses. Patient's renal function is now stable. Plan is for Fistulogram once INR at 1.5. Renal is recommending close follow up with primary nephrologist. Patient will probably required initiation of dialysis soon. Regarding diarrhea, this has improved on Ciprofloxacin and flagyl.   Subjective: He feels well. Diarrhea is significantly improved.   Assessment/Plan: AF with bradycardia , Cardiomyopathy -Now on diltiazem at reduced dose. HR now noted to be elevated into 110-120. Will add low dose coreg.  -2D echo as below. -coumadin on hold, INR need to be less than 1.5 for fistulogram.   Acute hypercarbic respiratory failure  Stable. Cont home cpap qhs  Supplemental O2 as need for SpO2 > 92% Chest x ray 8-23: slight improvement in right basilar atelectasis, right lung hypoinflation.  Incentive spirometry.   Diabetes; Hypoglycemia Had not received any diabetic medications. CBG is better.   Acute on chronic kidney disease Likely due to decrease volume from GI loss ?  Hyperkalemia - Treated at [facility], resolved.  Small AG metabolic acidosis, improving.  For fistulogram 8/27 depending on INR. INR is pending. Will need to go back on home Lasix dose per Nephrology. Per patient he was not on scheduled dose of Lasix. Will start Lasix 80mg  daily.   Essential  Hypertension with Renal complications Poorly controlled because he has been off of his home medications. Will start him on 80mg  Lasix. No Cozaar for now due to ARF. His nephrologist can determine this on follow up.  Sepsis No clear source except diarrhea. Now resolved. Was on dobutamine. Blood culture; no growth to date. Urine culture: no growth to date. Pro calcitonin at 3.01  Diarrhea Etiology unclear. On Cipro and flagyl. Will continue for 10 day course. KUB negative for obstruction. Abdominal US positive for ascites. Ascites probably related to increase volume in setting of renal failure. He is better. Diarrhea is improving.  Anemia Hgb stable  Acute encephalopathy Resolved  Hx or renal ca H/o R nephrectomy   DVT Prophylaxis: SCD's Code Status: Full Code.  Family Communication: Care discussed with patient.  Disposition Plan: Hopefully fistulogram today, 8-27. If so, DC 8/28.   Consultants:  Nephrologist  CCM   Vascular.   Procedures: Renal u/s>>> 1. Solitary left kidney with no hydronephrosis. History of ablated left renal masses. 2. 8 cm simple appearing cystic collection in the right renal fossa, was not present on the post nephrectomy comparisons in 2011 or 2007. Etiology and significance unclear. 3. Small volume pelvic free fluid.   2D echo >> EF 55%, mod/severely dilated LA, PA press 6mmHg  Events:  8/20 admitted to ICU with worsening renal failure  8/21 off dopamine  Antibiotics:  Ciprofloxacin 8-22  Flagyl 8-21   Objective: Filed Vitals:   10/18/13 0431  BP: 168/98  Pulse: 106  Temp: 98.7 F (37.1 C)  Resp: 18    Intake/Output Summary (Last 24 hours) at 10/18/13 0900 Last data filed at  10/18/13 0432  Gross per 24 hour  Intake    720 ml  Output      0 ml  Net    720 ml   Filed Weights   10/17/13 0500 10/17/13 2041 10/18/13 0447  Weight: 123.4 kg (272 lb 0.8 oz) 123.605 kg (272 lb 8 oz) 123.605 kg (272 lb 8 oz)    Exam:   General:   Alert in no distress.   Cardiovascular: S 1, S 2 RRR  Respiratory: Good air entry bilaterally. no crackles, no wheezing.   Abdomen: less distended, no tenderness, no rigidity.   Musculoskeletal: 1+ edema  Data Reviewed: Basic Metabolic Panel:  Recent Labs Lab 10/12/13 0448  10/13/13 1254 10/14/13 0535 10/15/13 0553 10/16/13 0633 10/17/13 0559  NA 142  < > 143 142 142 140 141  K 5.1  < > 4.2 4.3 4.2 4.3 4.1  CL 104  < > 104 105 106 106 108  CO2 22  < > 22 21 20  18* 16*  GLUCOSE 93  < > 71 83 120* 86 109*  BUN 80*  < > 69* 70* 71* 68* 64*  CREATININE 6.90*  < > 6.09* 5.90* 5.92* 5.52* 5.23*  CALCIUM 9.4  < > 8.6 8.6 8.5 8.9 9.0  MG 2.5  --   --   --   --   --   --   PHOS 5.5*  --  4.4 4.3 4.3 4.2 3.9  < > = values in this interval not displayed. Liver Function Tests:  Recent Labs Lab 10/12/13 0448 10/13/13 1254 10/14/13 0535 10/15/13 0553 10/16/13 0633 10/17/13 0559  AST 77*  --   --   --   --   --   ALT 54*  --   --   --   --   --   ALKPHOS 37*  --   --   --   --   --   BILITOT 1.1  --   --   --   --   --   PROT 7.1  --   --   --   --   --   ALBUMIN 2.7* 2.5* 2.4* 2.6* 2.8* 2.8*   CBC:  Recent Labs Lab 10/12/13 0448 10/12/13 1155 10/14/13 0535 10/15/13 0553  WBC 5.0 5.5 5.8 5.0  NEUTROABS 3.4 4.0  --   --   HGB 11.3* 10.9* 11.2* 10.8*  HCT 34.8* 33.2* 33.4* 32.4*  MCV 90.4 90.2 86.5 89.3  PLT 138* 132* 142* 136*   Cardiac Enzymes:  Recent Labs Lab 10/12/13 1155  TROPONINI <0.30   CBG:  Recent Labs Lab 10/17/13 1145 10/17/13 1640 10/17/13 1708 10/17/13 2044 10/18/13 0817  GLUCAP 96 160* 151* 140* 102*    Recent Results (from the past 240 hour(s))  URINE CULTURE     Status: None   Collection Time    10/12/13  4:11 AM      Result Value Ref Range Status   Specimen Description URINE, CATHETERIZED   Final   Special Requests Normal   Final   Culture  Setup Time     Final   Value: 10/12/2013 08:37     Performed at [facility]     Final   Value: NO GROWTH     Performed at [facility]   Culture     Final   Value: NO GROWTH     Performed at [facility]   Report  Status 10/13/2013 FINAL   Final  MRSA PCR SCREENING     Status: None   Collection Time    10/12/13  4:14 AM      Result Value Ref Range Status   MRSA by PCR NEGATIVE  NEGATIVE Final   Comment:            The GeneXpert MRSA Assay (FDA     approved for NASAL specimens     only), is one component of a     comprehensive MRSA colonization     surveillance program. It is not     intended to diagnose MRSA     infection nor to guide or     monitor treatment for     MRSA infections.  CULTURE, BLOOD (ROUTINE X 2)     Status: None   Collection Time    10/12/13  4:44 AM      Result Value Ref Range Status   Specimen Description BLOOD LEFT HAND   Final   Special Requests BOTTLES DRAWN AEROBIC ONLY 5CC   Final   Culture  Setup Time     Final   Value: 10/12/2013 08:29     Performed at [facility]   Culture     Final   Value: NO GROWTH 5 DAYS     Performed at [facility]   Report Status 10/18/2013 FINAL   Final  CULTURE, BLOOD (ROUTINE X 2)     Status: None   Collection Time    10/12/13  4:48 AM      Result Value Ref Range Status   Specimen Description BLOOD LEFT HAND   Final   Special Requests BOTTLES DRAWN AEROBIC AND ANAEROBIC 5CC   Final   Culture  Setup Time     Final   Value: 10/12/2013 08:29     Performed at [facility]   Culture     Final   Value: NO GROWTH 5 DAYS     Performed at [facility]   Report Status 10/18/2013 FINAL   Final     Studies: No results found.  Scheduled Meds: . antiseptic oral rinse  7 mL Mouth Rinse BID  . carvedilol  6.25 mg Oral BID WC  . cholecalciferol  1,000 Units Oral Daily  . ciprofloxacin  250 mg Oral Daily  . diltiazem  240 mg Oral Daily  . hydrALAZINE  50 mg Oral 3 times per day  . metroNIDAZOLE  500 mg Oral 3 times per day  .  sodium bicarbonate  650 mg Oral TID   Continuous Infusions:    Principal Problem:   Acute on chronic renal failure Active Problems:   Bradycardia   Metabolic acidosis   Acute respiratory failure with hypercapnia   Time spent: 35 minutes.    [facility] Pager [phone].   If 7PM-7AM, please contact night-coverage at [email or web address], password [facility] 10/18/2013, 9:00 AM  LOS: 6 days

## 2013-10-18 NOTE — Progress Notes (Addendum)
     PT/INR lab results pending.  Spoke with nurse to insure the labs get drawn this am.   INR need to be less than 1.5 for fistulogram.   COLLINS, EMMA MAUREEN PA-C  Addendum  Unfortunately, the 1 mg Vit K given yesterday was oral not IV as intended.  5 mg of Vitamin K has already been ordered by the hospitalist.  Will rescheduled patient for tomorrow.  [name], MD [facility] Office: [phone] Pager: [phone]  10/18/2013, 10:27 AM

## 2013-10-18 NOTE — Progress Notes (Signed)
Pt placed on auto titrate CPAP with full face mask.

## 2013-10-19 ENCOUNTER — Encounter (HOSPITAL_COMMUNITY)
Admission: AD | Disposition: A | Discharge status: Home / Self Care | Payer: Self-pay | Source: Other Acute Inpatient Hospital | Attending: Internal Medicine

## 2013-10-19 DIAGNOSIS — N185 Chronic kidney disease, stage 5: Secondary | ICD-10-CM

## 2013-10-19 HISTORY — PX: FISTULOGRAM: SHX5832

## 2013-10-19 LAB — RENAL FUNCTION PANEL
ALBUMIN: 2.5 g/dL — AB (ref 3.5–5.2)
Anion gap: 16 — ABNORMAL HIGH (ref 5–15)
BUN: 66 mg/dL — ABNORMAL HIGH (ref 6–23)
CHLORIDE: 108 meq/L (ref 96–112)
CO2: 17 mEq/L — ABNORMAL LOW (ref 19–32)
Calcium: 8.8 mg/dL (ref 8.4–10.5)
Creatinine, Ser: 5.39 mg/dL — ABNORMAL HIGH (ref 0.50–1.35)
GFR, EST AFRICAN AMERICAN: 12 mL/min — AB (ref >90)
GFR, EST NON AFRICAN AMERICAN: 10 mL/min — AB (ref >90)
Glucose, Bld: 113 mg/dL — ABNORMAL HIGH (ref 70–99)
Phosphorus: 4.2 mg/dL (ref 2.3–4.6)
Potassium: 4.3 mEq/L (ref 3.7–5.3)
SODIUM: 141 meq/L (ref 137–147)

## 2013-10-19 LAB — GLUCOSE, CAPILLARY
GLUCOSE-CAPILLARY: 121 mg/dL — AB (ref 70–99)
GLUCOSE-CAPILLARY: 118 mg/dL — AB (ref 70–99)
Glucose-Capillary: 98 mg/dL (ref 70–99)

## 2013-10-19 LAB — PROTIME-INR
INR: 1.66 — AB (ref 0.00–1.49)
Prothrombin Time: 19.6 seconds — ABNORMAL HIGH (ref 11.6–15.2)

## 2013-10-19 SURGERY — FISTULOGRAM
Anesthesia: LOCAL

## 2013-10-19 SURGERY — ASSESSMENT, SHUNT FUNCTION, WITH CONTRAST RADIOGRAPHIC STUDY
Anesthesia: LOCAL

## 2013-10-19 MED ORDER — HEPARIN (PORCINE) IN NACL 2-0.9 UNIT/ML-% IJ SOLN
INTRAMUSCULAR | Status: AC
  Filled 2013-10-19: qty 1000

## 2013-10-19 MED ORDER — DILTIAZEM HCL ER COATED BEADS 300 MG PO CP24
300.0000 mg | ORAL_CAPSULE | Freq: Every day | ORAL | Status: DC
  Administered 2013-10-20: 300 mg via ORAL
  Filled 2013-10-19: qty 1

## 2013-10-19 MED ORDER — LIDOCAINE HCL (PF) 1 % IJ SOLN
INTRAMUSCULAR | Status: AC
  Filled 2013-10-19: qty 30
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
## 2013-10-19 NOTE — Progress Notes (Signed)
[facility] PROGRESS NOTE  Albert [name] [id number] DOB: 08-31-1955 DOA: 10/12/2013 PCP: [name], MD  Brief HPI: 58 year old male presented to [facility] ED 8/20 c/o dyspnea. In ED found to be bradycardic in 30's, atropine given and TC pacing applied. Noted to have worsening renal failure and acidemia. Transferred to [facility] for ICU admission. Patient bradycardia has resolved. It was thought to be secondary to hyperkalemia. Patient cardizem was resumed because of increase HR. Continue to hold BB at this time. Regarding his CKD, Nephrology was consulted. Acute on chronic renal failure thought to be secondary to GI looses. Patient's renal function is now stable. Plan is for Fistulogram once INR at 1.5. Renal is recommending close follow up with primary nephrologist. Patient will probably required initiation of dialysis soon. Regarding diarrhea, this has improved on Ciprofloxacin and flagyl.   Subjective: He continues to feel well. Diarrhea is better.  Assessment/Plan: AF with bradycardia -Bradycardia had resolved. -Now on diltiazem at reduced dose. HR was noted to be elevated into 110-120 and so he was started on coreg. HR is better. -2D echo as below. -coumadin on hold, INR need to be less than 1.5 for fistulogram.   Acute hypercarbic respiratory failure  Resolved. Cont home cpap qhs  Supplemental O2 as need for SpO2 > 92% Chest x ray 8-23: slight improvement in right basilar atelectasis, right lung hypoinflation.  Incentive spirometry.   Diabetes; Hypoglycemia Had not received any diabetic medications. CBG is better.   Acute on chronic kidney disease Likely due to decrease volume from GI loss ?  Hyperkalemia - Treated at [facility], resolved.  Small AG metabolic acidosis, improving.  Await fistulogram depending on INR.  Will need to go back on home Lasix dose per Nephrology. He was given Lasix 80mg  daily. Creatinine slightly higher today. Will recheck in  AM.  Essential Hypertension with Renal complications Poorly controlled because he has been off of his home medications. No Cozaar for now due to ARF. His nephrologist can determine this on follow up. Consider increasing cardizem to usual dose.  Sepsis No clear source except diarrhea. Now resolved. Was on dobutamine. Blood culture; no growth to date. Urine culture: no growth to date. Pro calcitonin at 3.01  Diarrhea Etiology unclear. On Cipro and flagyl. Will continue for 10 day course (stop on 8/31). KUB negative for obstruction. Abdominal US positive for ascites. Ascites probably related to increase volume in setting of renal failure. He is better. Diarrhea is improving.  Anemia Hgb stable  Acute encephalopathy Resolved  Hx or renal ca H/o R nephrectomy   DVT Prophylaxis: SCD's Code Status: Full Code.  Family Communication: Care discussed with patient.  Disposition Plan: Hopefully fistulogram today. If so, DC 8/29.   Consultants:  Nephrologist  CCM   Vascular.   Procedures: Renal u/s>>> 1. Solitary left kidney with no hydronephrosis. History of ablated left renal masses. 2. 8 cm simple appearing cystic collection in the right renal fossa, was not present on the post nephrectomy comparisons in 2011 or 2007. Etiology and significance unclear. 3. Small volume pelvic free fluid.   2D echo >> EF 55%, mod/severely dilated LA, PA press 51mmHg  Events:  8/20 admitted to ICU with worsening renal failure  8/21 off dopamine  Antibiotics:  Ciprofloxacin 8-22  Flagyl 8-21   Objective: Filed Vitals:   10/19/13 0500  BP: 160/104  Pulse: 98  Temp: 97.8 F (36.6 C)  Resp: 18    Intake/Output Summary (Last 24 hours) at 10/19/13 0801 Last data  filed at 10/19/13 0500  Gross per 24 hour  Intake      0 ml  Output   1600 ml  Net  -1600 ml   Filed Weights   10/17/13 2041 10/18/13 0447 10/18/13 2118  Weight: 123.605 kg (272 lb 8 oz) 123.605 kg (272 lb 8 oz) 123.5 kg (272  lb 4.3 oz)    Exam:   General:  Alert in no distress.   Cardiovascular: S 1, S 2 RRR  Respiratory: Good air entry bilaterally. no crackles, no wheezing.   Abdomen: less distended, no tenderness, no rigidity.   Musculoskeletal: 1+ edema noted bilaterally.  Data Reviewed: Basic Metabolic Panel:  Recent Labs Lab 10/15/13 0553 10/16/13 0633 10/17/13 0559 10/18/13 0841 10/19/13 0530  NA 142 140 141 144 141  K 4.2 4.3 4.1 4.1 4.3  CL 106 106 108 110 108  CO2 20 18* 16* 17* 17*  GLUCOSE 120* 86 109* 110* 113*  BUN 71* 68* 64* 62* 66*  CREATININE 5.92* 5.52* 5.23* 5.08* 5.39*  CALCIUM 8.5 8.9 9.0 9.0 8.8  PHOS 4.3 4.2 3.9 4.0 4.2   Liver Function Tests:  Recent Labs Lab 10/15/13 0553 10/16/13 0633 10/17/13 0559 10/18/13 0841 10/19/13 0530  ALBUMIN 2.6* 2.8* 2.8* 2.6* 2.5*   CBC:  Recent Labs Lab 10/12/13 1155 10/14/13 0535 10/15/13 0553  WBC 5.5 5.8 5.0  NEUTROABS 4.0  --   --   HGB 10.9* 11.2* 10.8*  HCT 33.2* 33.4* 32.4*  MCV 90.2 86.5 89.3  PLT 132* 142* 136*   Cardiac Enzymes:  Recent Labs Lab 10/12/13 1155  TROPONINI <0.30   CBG:  Recent Labs Lab 10/17/13 1708 10/17/13 2044 10/18/13 0817 10/18/13 1241 10/18/13 1702  GLUCAP 151* 140* 102* 118* 182*    Recent Results (from the past 240 hour(s))  URINE CULTURE     Status: None   Collection Time    10/12/13  4:11 AM      Result Value Ref Range Status   Specimen Description URINE, CATHETERIZED   Final   Special Requests Normal   Final   Culture  Setup Time     Final   Value: 10/12/2013 08:37     Performed at [facility]     Final   Value: NO GROWTH     Performed at [facility]   Culture     Final   Value: NO GROWTH     Performed at [facility]   Report Status 10/13/2013 FINAL   Final  MRSA PCR SCREENING     Status: None   Collection Time    10/12/13  4:14 AM      Result Value Ref Range Status   MRSA by PCR NEGATIVE  NEGATIVE Final    Comment:            The GeneXpert MRSA Assay (FDA     approved for NASAL specimens     only), is one component of a     comprehensive MRSA colonization     surveillance program. It is not     intended to diagnose MRSA     infection nor to guide or     monitor treatment for     MRSA infections.  CULTURE, BLOOD (ROUTINE X 2)     Status: None   Collection Time    10/12/13  4:44 AM      Result Value Ref Range Status   Specimen Description BLOOD LEFT  HAND   Final   Special Requests BOTTLES DRAWN AEROBIC ONLY 5CC   Final   Culture  Setup Time     Final   Value: 10/12/2013 08:29     Performed at [facility]   Culture     Final   Value: NO GROWTH 5 DAYS     Performed at [facility]   Report Status 10/18/2013 FINAL   Final  CULTURE, BLOOD (ROUTINE X 2)     Status: None   Collection Time    10/12/13  4:48 AM      Result Value Ref Range Status   Specimen Description BLOOD LEFT HAND   Final   Special Requests BOTTLES DRAWN AEROBIC AND ANAEROBIC 5CC   Final   Culture  Setup Time     Final   Value: 10/12/2013 08:29     Performed at [facility]   Culture     Final   Value: NO GROWTH 5 DAYS     Performed at [facility]   Report Status 10/18/2013 FINAL   Final     Studies: No results found.  Scheduled Meds: . antiseptic oral rinse  7 mL Mouth Rinse BID  . carvedilol  6.25 mg Oral BID WC  . cholecalciferol  1,000 Units Oral Daily  . ciprofloxacin  250 mg Oral Daily  . diltiazem  240 mg Oral Daily  . hydrALAZINE  50 mg Oral 3 times per day  . metroNIDAZOLE  500 mg Oral 3 times per day  . sodium bicarbonate  650 mg Oral TID   Continuous Infusions:    Principal Problem:   Acute on chronic renal failure Active Problems:   Bradycardia   Metabolic acidosis   Acute respiratory failure with hypercapnia   Time spent: 35 minutes.    [facility] Pager [phone].   If 7PM-7AM, please contact night-coverage at  [email or web address], password [facility] 10/19/2013, 8:01 AM  LOS: 7 days

## 2013-10-20 LAB — RENAL FUNCTION PANEL
ANION GAP: 16 — AB (ref 5–15)
Albumin: 2.8 g/dL — ABNORMAL LOW (ref 3.5–5.2)
BUN: 66 mg/dL — AB (ref 6–23)
CHLORIDE: 108 meq/L (ref 96–112)
CO2: 17 mEq/L — ABNORMAL LOW (ref 19–32)
Calcium: 9.1 mg/dL (ref 8.4–10.5)
Creatinine, Ser: 5.29 mg/dL — ABNORMAL HIGH (ref 0.50–1.35)
GFR calc Af Amer: 12 mL/min — ABNORMAL LOW (ref >90)
GFR calc non Af Amer: 11 mL/min — ABNORMAL LOW (ref >90)
Glucose, Bld: 114 mg/dL — ABNORMAL HIGH (ref 70–99)
POTASSIUM: 4.3 meq/L (ref 3.7–5.3)
Phosphorus: 4.2 mg/dL (ref 2.3–4.6)
SODIUM: 141 meq/L (ref 137–147)

## 2013-10-20 LAB — PROTIME-INR
INR: 1.53 — ABNORMAL HIGH (ref 0.00–1.49)
Prothrombin Time: 18.4 seconds — ABNORMAL HIGH (ref 11.6–15.2)

## 2013-10-20 LAB — GLUCOSE, CAPILLARY
GLUCOSE-CAPILLARY: 154 mg/dL — AB (ref 70–99)
Glucose-Capillary: 113 mg/dL — ABNORMAL HIGH (ref 70–99)

## 2013-10-20 MED ORDER — METRONIDAZOLE 500 MG PO TABS: 500.0000 mg | ORAL_TABLET | Freq: Three times a day (TID) | ORAL | Status: DC

## 2013-10-20 MED ORDER — CARVEDILOL 25 MG PO TABS: 12.5000 mg | ORAL_TABLET | Freq: Two times a day (BID) | ORAL | Status: DC

## 2013-10-20 MED ORDER — CIPROFLOXACIN HCL 250 MG PO TABS
250.0000 mg | ORAL_TABLET | Freq: Every day | ORAL | Status: DC
Start: 2013-10-20 — End: 2014-02-11

## 2013-10-20 MED ORDER — HYDRALAZINE HCL 50 MG PO TABS: 50.0000 mg | ORAL_TABLET | Freq: Three times a day (TID) | ORAL | Status: DC

## 2013-10-20 NOTE — Discharge Instructions (Signed)
Chronic Kidney Disease °Chronic kidney disease occurs when the kidneys are damaged over a long period. The kidneys are two organs that lie on either side of the spine between the middle of the back and the front of the abdomen. The kidneys:  °· Remove wastes and extra water from the blood.   °· Produce important hormones. These help keep bones strong, regulate blood pressure, and help create red blood cells.   °· Balance the fluids and chemicals in the blood and tissues. °A small amount of kidney damage may not cause problems, but a large amount of damage may make it difficult or impossible for the kidneys to work the way they should. If steps are not taken to slow down the kidney damage or stop it from getting worse, the kidneys may stop working permanently. Most of the time, chronic kidney disease does not go away. However, it can often be controlled, and those with the disease can usually live normal lives. °CAUSES  °The most common causes of chronic kidney disease are diabetes and high blood pressure (hypertension). Chronic kidney disease may also be caused by:  °· Diseases that cause the kidneys' filters to become inflamed.   °· Diseases that affect the immune system.   °· Genetic diseases.   °· Medicines that damage the kidneys, such as anti-inflammatory medicines.   °· Poisoning or exposure to toxic substances.   °· A reoccurring kidney or urinary infection.   °· A problem with urine flow. This may be caused by:   °¨ Cancer.   °¨ Kidney stones.   °¨ An enlarged prostate in males. °SIGNS AND SYMPTOMS  °Because the kidney damage in chronic kidney disease occurs slowly, symptoms develop slowly and may not be obvious until the kidney damage becomes severe. A person may have a kidney disease for years without showing any symptoms. Symptoms can include:  °· Swelling (edema) of the legs, ankles, or feet.   °· Tiredness (lethargy).   °· Nausea or vomiting.   °· Confusion.   °· Problems with urination, such as:    °¨ Decreased urine production.   °¨ Frequent urination, especially at night.   °¨ Frequent accidents in children who are potty trained.   °· Muscle twitches and cramps.   °· Shortness of breath.  °· Weakness.   °· Persistent itchiness.   °· Loss of appetite. °· Metallic taste in the mouth. °· Trouble sleeping. °· Slowed development in children. °· Short stature in children. °DIAGNOSIS  °Chronic kidney disease may be detected and diagnosed by tests, including blood, urine, imaging, or kidney biopsy tests.  °TREATMENT  °Most chronic kidney diseases cannot be cured. Treatment usually involves relieving symptoms and preventing or slowing the progression of the disease. Treatment may include:  °· A special diet. You may need to avoid alcohol and foods that are salty and high in potassium.   °· Medicines. These may:   °¨ Lower blood pressure.   °¨ Relieve anemia.   °¨ Relieve swelling.   °¨ Protect the bones. °HOME CARE INSTRUCTIONS  °· Follow your prescribed diet.   °· Take medicines only as directed by your health care provider. Do not take any new medicines (prescription, over-the-counter, or nutritional supplements) unless approved by your health care provider. Many medicines can worsen your kidney damage or need to have the dose adjusted.   °· Quit smoking if you smoke. Talk to your health care provider about a smoking cessation program.   °· Keep all follow-up visits as directed by your health care provider. °SEEK IMMEDIATE MEDICAL CARE IF: °· Your symptoms get worse or you develop new symptoms.   °· You develop symptoms of end-stage kidney disease. These   include:   °¨ Headaches.   °¨ Abnormally dark or light skin.   °¨ Numbness in the hands or feet.   °¨ Easy bruising.   °¨ Frequent hiccups.   °¨ Menstruation stops.   °· You have a fever.   °· You have decreased urine production.   °· You have pain or bleeding when urinating. °MAKE SURE YOU: °· Understand these instructions. °· Will watch your condition. °· Will  get help right away if you are not doing well or get worse. °FOR MORE INFORMATION  °· American Association of Kidney Patients: www.aakp.org °· National Kidney Foundation: www.kidney.org °· American Kidney Fund: www.akfinc.org °· [employer] [facility]: www.lifeoptions.org and www.kidneyschool.org °Document Released: 11/18/2007 Document Revised: 06/25/2013 Document Reviewed: 10/08/2011 °ExitCare® Patient Information ©2015 [employer]. This information is not intended to replace advice given to you by your health care provider. Make sure you discuss any questions you have with your health care provider. ° °

## 2013-10-20 NOTE — Progress Notes (Signed)
Pt placed his self on CPAP

## 2013-10-20 NOTE — Discharge Summary (Addendum)
[facility]  Physician Discharge Summary   Patient ID: Albert [name] MRN: [id number] DOB/AGE: [birth year] 60 y.o.  Admit date: 10/12/2013 Discharge date: 10/20/2013  PCP: Albert Grills, MD  DISCHARGE DIAGNOSES:  Principal Problem:   Acute on chronic renal failure Active Problems:   Bradycardia   Metabolic acidosis   Acute respiratory failure with hypercapnia   RECOMMENDATIONS FOR OUTPATIENT FOLLOW UP: 1. Patient to follow up with PCP and Nephrology this week. 2. Adjusted some of his medications as mentioned below. 3. Holding Lantus for now 4. Patient was asked to have his INR checked on Wednesday 9/2.  DISCHARGE CONDITION: fair  Diet recommendation: Mod Carb  Filed Weights   10/18/13 0447 10/18/13 2118 10/19/13 2038  Weight: 123.605 kg (272 lb 8 oz) 123.5 kg (272 lb 4.3 oz) 119.387 kg (263 lb 3.2 oz)    INITIAL HISTORY: 58 year old male presented to [facility] ED 8/20 c/o dyspnea. In ED found to be bradycardic in 30's, atropine given and TC pacing applied. Noted to have worsening renal failure and acidemia. Transferred to [facility] for ICU admission. Patient bradycardia has resolved. It was thought to be secondary to hyperkalemia. Patient cardizem was resumed because of increase HR. Continue to hold BB at this time. Regarding his CKD, Nephrology was consulted. Acute on chronic renal failure thought to be secondary to GI looses. Patient's renal function is now stable. Fistulogram was done 8/28. Patient will probably required initiation of dialysis soon. Regarding diarrhea, this has improved on Ciprofloxacin and flagyl.   Consultants:  Nephrologist  CCM  Vascular.   Procedures:  Renal u/s>>> 1. Solitary left kidney with no hydronephrosis. History of ablated left renal masses. 2. 8 cm simple appearing cystic collection in the right renal fossa, was not present on the post nephrectomy comparisons in 2011 or 2007. Etiology and significance unclear. 3. Small volume  pelvic free fluid.  2D echo >> EF 55%, mod/severely dilated LA, PA press 66mmHg   Events:  8/20 admitted to ICU with worsening renal failure  8/21 off dopamine    HOSPITAL COURSE:   Atrial Fibrillation with bradycardia  As noted above he required transcutaneous pacemaker briefly. Bradycardia was thought to be due to metabolic derangements. He was taken off of his Diltiazem and coreg. Bradycardia resolved. Then it was noted that his HR was starting to remain elevated. Diltiazem was resumed and then BB was added back at lower dose. HR is better now. He will need close follow up with his PCP. Coumadin can be resumed.  Acute hypercarbic respiratory failure  This has resolved. Cont home cpap qhs. Chest x ray 8-23: slight improvement in right basilar atelectasis, right lung hypoinflation.   Diabetes Mellitus Type 2 with Renal Complications He had a few hypoglycemic episodes. This was likely due to worsening renal function. Because his CBG's remained low normal he is being asked to hold his Lantus and check CBG at home and report to his PCP. He could resume the glipizide. Unfortunately Hba1c was not checked.   Acute on chronic kidney disease  His creatinine was 6.9 at presentation. Baseline was around 4. He had taken 120mg  of lasix for 3 days due to pedal edema. He was also experiencing diarrhea which could have contributed as well. He was seen by nephrology. His lasix was stopped. His renal function improved slowly. Discussed with his nephrologist, Dr. Lowanda Foster and he recommends leaving him off the lasix for now. He also underwent fistulogram 8/28 and it was functioning well. Patient will likely need  to go on dialysis soon.   Essential Hypertension with Renal complications  This was poorly controlled because he had been off of his home medications. Cozaar was stopped due to worsening renal function. Hydralazine was added. BP is better but still high. Further management per his  nephrologist.  Sepsis  No clear source except diarrhea. Now resolved. Was on dobutamine briefly. Blood culture; no growth to date. Urine culture: no growth to date. Pro calcitonin at 3.01   Diarrhea  Etiology unclear. He was started on Cipro and flagyl. Will continue for 10 day course. KUB negative for obstruction. Abdominal US positive for ascites. Ascites probably related to increase volume in setting of renal failure. Diarrhea is improving. No stool studies available.  Anemia  Hgb stable   Acute encephalopathy  Resolved   Hx or renal ca  H/o R nephrectomy   Patient is better. He is stable for discharge.   PERTINENT LABS: The results of significant diagnostics from this hospitalization (including imaging, microbiology, ancillary and laboratory) are listed below for reference.    Microbiology: Recent Results (from the past 240 hour(s))  URINE CULTURE     Status: None   Collection Time    10/12/13  4:11 AM      Result Value Ref Range Status   Specimen Description URINE, CATHETERIZED   Final   Special Requests Normal   Final   Culture  Setup Time     Final   Value: 10/12/2013 08:37     Performed at [facility]     Final   Value: NO GROWTH     Performed at [facility]   Culture     Final   Value: NO GROWTH     Performed at [facility]   Report Status 10/13/2013 FINAL   Final  MRSA PCR SCREENING     Status: None   Collection Time    10/12/13  4:14 AM      Result Value Ref Range Status   MRSA by PCR NEGATIVE  NEGATIVE Final   Comment:            The GeneXpert MRSA Assay (FDA     approved for NASAL specimens     only), is one component of a     comprehensive MRSA colonization     surveillance program. It is not     intended to diagnose MRSA     infection nor to guide or     monitor treatment for     MRSA infections.  CULTURE, BLOOD (ROUTINE X 2)     Status: None   Collection Time    10/12/13  4:44 AM      Result Value Ref  Range Status   Specimen Description BLOOD LEFT HAND   Final   Special Requests BOTTLES DRAWN AEROBIC ONLY 5CC   Final   Culture  Setup Time     Final   Value: 10/12/2013 08:29     Performed at [facility]   Culture     Final   Value: NO GROWTH 5 DAYS     Performed at [facility]   Report Status 10/18/2013 FINAL   Final  CULTURE, BLOOD (ROUTINE X 2)     Status: None   Collection Time    10/12/13  4:48 AM      Result Value Ref Range Status   Specimen Description BLOOD LEFT HAND   Final   Special Requests  BOTTLES DRAWN AEROBIC AND ANAEROBIC 5CC   Final   Culture  Setup Time     Final   Value: 10/12/2013 08:29     Performed at [facility]   Culture     Final   Value: NO GROWTH 5 DAYS     Performed at [facility]   Report Status 10/18/2013 FINAL   Final     Labs: Basic Metabolic Panel:  Recent Labs Lab 10/16/13 0633 10/17/13 0559 10/18/13 0841 10/19/13 0530 10/20/13 0427  NA 140 141 144 141 141  K 4.3 4.1 4.1 4.3 4.3  CL 106 108 110 108 108  CO2 18* 16* 17* 17* 17*  GLUCOSE 86 109* 110* 113* 114*  BUN 68* 64* 62* 66* 66*  CREATININE 5.52* 5.23* 5.08* 5.39* 5.29*  CALCIUM 8.9 9.0 9.0 8.8 9.1  PHOS 4.2 3.9 4.0 4.2 4.2   Liver Function Tests:  Recent Labs Lab 10/16/13 0633 10/17/13 0559 10/18/13 0841 10/19/13 0530 10/20/13 0427  ALBUMIN 2.8* 2.8* 2.6* 2.5* 2.8*   CBC:  Recent Labs Lab 10/14/13 0535 10/15/13 0553  WBC 5.8 5.0  HGB 11.2* 10.8*  HCT 33.4* 32.4*  MCV 86.5 89.3  PLT 142* 136*   CBG:  Recent Labs Lab 10/19/13 0801 10/19/13 1229 10/19/13 1712 10/19/13 2049 10/20/13 0758  GLUCAP 121* 98 118* 154* 113*     IMAGING STUDIES Dg Abd 1 View  10/14/2013   CLINICAL DATA:  Three weeks of abdominal swelling unresponsive to furosemide; history of right nephrectomy 15 years ago.  EXAM: ABDOMEN - 1 VIEW  COMPARISON:  None.  FINDINGS: The bowel gas pattern is nonspecific. There is no evidence of ileus or  obstruction. The stool burden within the colon does not appear excessive. There are numerous surgical clips to the right of the spine from previous nephrectomy. The bony structures are unremarkable.  IMPRESSION: Nonspecific bowel gas pattern without evidence of ileus or obstruction.   Electronically Signed   By: David  Martinique   On: 10/14/2013 12:55   US Renal  10/13/2013   CLINICAL DATA:  58 year old male with acute renal insufficiency. History of right nephrectomy. Status post ablation of to left renal masses. Initial encounter.  EXAM: RENAL/URINARY TRACT ULTRASOUND COMPLETE  COMPARISON:  Abdomen MRI 10/08/2009 and non contrast CT Abdomen and Pelvis 12/22/2005.  FINDINGS: Right Kidney:  Length: Surgically absent. In the right renal fossa there is a large simple appearing cyst measuring up to 7.8 cm diameter. This is new since 2011, and the etiology is unclear.  Left Kidney:  Length: 14.9 cm. No hydronephrosis. Multiple hypoechoic cyst. Exophytic 5.9 cm lateral left renal mass, but without vascularity on Doppler interrogation (image 11 power Doppler) common probably reflects an ablated lesion.  Bladder:  Decompressed.  Reportedly Foley catheter in place.  Other findings:    Pelvic free fluid.  IMPRESSION: 1. Solitary left kidney with no hydronephrosis. History of ablated left renal masses. 2. 8 cm simple appearing cystic collection in the right renal fossa, was not present on the post nephrectomy comparisons in 2011 or 2007. Etiology and significance unclear. 3. Small volume pelvic free fluid.   Electronically Signed   By: Lars Pinks M.D.   On: 10/13/2013 00:55   US Abdomen Limited  10/14/2013   CLINICAL DATA:  Assess for ascites  EXAM: LIMITED ABDOMEN ULTRASOUND FOR ASCITES  TECHNIQUE: Limited ultrasound survey for ascites was performed in all four abdominal quadrants.  COMPARISON:  Renal ultrasound of October 12, 2013.  FINDINGS: There is a moderate volume of ascites demonstrated in the right lower quadrant of  the abdomen. Floating bowel loops are visible. A small amount of fluid is present in the left lower quadrant. There is no definite left upper quadrant or right upper quadrant fluid. Incidental note is made of echogenic foci within the gallbladder.  IMPRESSION: There is a moderate volume of ascites in a right lower quadrant of the abdomen with a smaller volume on the left. The gallbladder contains echogenic foci which are floating compatible with stones.   Electronically Signed   By: David  Martinique   On: 10/14/2013 12:53   Dg Chest Portable 1 View  10/14/2013   CLINICAL DATA:  Shortness of breath  EXAM: PORTABLE CHEST - 1 VIEW  COMPARISON:  Portable chest x-ray of October 13, 2013  FINDINGS: The right lung remains hypoinflated. Atelectasis at the lung base has improved however. The left lung is better inflated and clear. The cardiac silhouette is mildly enlarged. The pulmonary vascularity is normal.  IMPRESSION: There is been slight improvement in right basilar atelectasis. Otherwise allowing for hypoinflation and patient positioning the examination is unchanged.   Electronically Signed   By: David  Martinique   On: 10/14/2013 08:40   Dg Chest Port 1 View  10/13/2013   CLINICAL DATA:  Assess pulmonary edema  EXAM: PORTABLE CHEST - 1 VIEW  COMPARISON:  Prior chest x-ray 10/12/2013  FINDINGS: Stable cardiac and mediastinal contours. Persistent right basilar atelectasis versus infiltrate. Mild subsegmental atelectasis also noted in the left retrocardiac region. No evidence of pulmonary edema. Atherosclerotic calcifications are present within the transverse aorta. External defibrillator pad projects the left chest. No pneumothorax.  IMPRESSION: 1. Interval resolution of mild interstitial edema. 2. Persistent right greater than left bibasilar opacities which may reflect atelectasis or infiltrate. Atelectasis is favored. 3. Stable cardiomegaly.   Electronically Signed   By: Jacqulynn Cadet M.D.   On: 10/13/2013 08:10    Dg Chest Port 1 View  10/12/2013   CLINICAL DATA:  Edema.  EXAM: PORTABLE CHEST - 1 VIEW  COMPARISON:  10/11/2013.  FINDINGS: Mediastinum and hilar structures are unremarkable. Cardiomegaly with mild pulmonary vascular prominence. Mild interstitial prominence. Mild congestive heart failure cannot be excluded. Mild basilar atelectasis on the right. Pneumonia cannot be excluded. No pneumothorax. No acute bony abnormality  IMPRESSION: 1. Mild congestive heart failure. 2. Right base subsegmental atelectasis versus mild pneumonia.   Electronically Signed   By: Marcello Moores  Register   On: 10/12/2013 11:45    DISCHARGE EXAMINATION: Filed Vitals:   10/19/13 1808 10/19/13 2038 10/20/13 0446 10/20/13 0940  BP: 152/87 123/84 150/85 169/91  Pulse: 0 91 98 110  Temp: 97.5 F (36.4 C) 98.6 F (37 C) 97.9 F (36.6 C) 97.6 F (36.4 C)  TempSrc: Oral Oral Oral Oral  Resp: 20 18 18 17   Height:      Weight:  119.387 kg (263 lb 3.2 oz)    SpO2: 100% 100% 100% 100%   General appearance: alert, cooperative, appears stated age and no distress Resp: clear to auscultation bilaterally Cardio: regular rate and rhythm, S1, S2 normal, no murmur, click, rub or gallop  DISPOSITION: Home  Discharge Instructions   Call MD for:  difficulty breathing, headache or visual disturbances    Complete by:  As directed      Call MD for:  extreme fatigue    Complete by:  As directed      Call MD for:  persistant dizziness or  light-headedness    Complete by:  As directed      Call MD for:  temperature >100.4    Complete by:  As directed      Diet Carb Modified    Complete by:  As directed      Discharge instructions    Complete by:  As directed   We are holding your insulin as your sugars have been borderline low. Please check your sugars at home and call your doctor if they stay above 180. Dr. Lowanda Foster has asked you to stay off the Lasix for now. You will need to see him in follow up soon. Please call his office on Monday.      Increase activity slowly    Complete by:  As directed            ALLERGIES: No Known Allergies  Current Discharge Medication List    START taking these medications   Details  ciprofloxacin (CIPRO) 250 MG tablet Take 1 tablet (250 mg total) by mouth daily. For 3 more days Qty: 3 tablet, Refills: 0    hydrALAZINE (APRESOLINE) 50 MG tablet Take 1 tablet (50 mg total) by mouth every 8 (eight) hours. Qty: 90 tablet, Refills: 0    metroNIDAZOLE (FLAGYL) 500 MG tablet Take 1 tablet (500 mg total) by mouth every 8 (eight) hours. For 3 more days Qty: 9 tablet, Refills: 0      CONTINUE these medications which have CHANGED   Details  carvedilol (COREG) 25 MG tablet Take 0.5 tablets (12.5 mg total) by mouth 2 (two) times daily with a meal. Qty: 30 tablet, Refills: 1      CONTINUE these medications which have NOT CHANGED   Details  allopurinol (ZYLOPRIM) 100 MG tablet Take 100 mg by mouth daily.     calcitRIOL (ROCALTROL) 0.5 MCG capsule Take 0.5 mcg by mouth daily.    cholecalciferol (VITAMIN D) 1000 UNITS tablet Take 1,000 Units by mouth daily.     diltiazem (TIAZAC) 300 MG 24 hr capsule Take 300 mg by mouth daily.    fenofibrate 160 MG tablet Take 160 mg by mouth daily.    fish oil-omega-3 fatty acids 1000 MG capsule Take 1 g by mouth 3 (three) times daily.     glipiZIDE (GLUCOTROL) 10 MG tablet Take 10 mg by mouth daily.     pravastatin (PRAVACHOL) 10 MG tablet Take 10 mg by mouth every other day. Take every other night    sodium bicarbonate 650 MG tablet Take 650 mg by mouth 3 (three) times daily.    Tetrahydrozoline HCl (VISINE OP) Place 2 drops into both eyes daily as needed (dry eyes).    warfarin (COUMADIN) 2.5 MG tablet Take 1.25-2.5 mg by mouth at bedtime. Take 1/2 tablet (1.25 mg) on Monday, Wednesday and Friday, take 1 tablet (2.5 mg) on Sunday, Tuesday, Thursday and Saturday      STOP taking these medications     furosemide (LASIX) 40 MG tablet       insulin glargine (LANTUS) 100 UNIT/ML injection      losartan (COZAAR) 100 MG tablet        Follow-up Information   Follow up with Albert Grills, MD. Schedule an appointment as soon as possible for a visit in 1 week. (post hospitalization follow up)    Specialty:  Family Medicine   Contact information:   [place] [phone]       Follow up with [facility], MD. Schedule  an appointment as soon as possible for a visit in 3 days. (to follow up on kidney disease)    Specialty:  Nephrology   Contact information:   [address] [address] [phone]       TOTAL DISCHARGE TIME: 56 mins  [facility] Pager [phone]  10/20/2013, 10:02 AM  Disclaimer: This note was dictated with voice recognition software. Similar sounding words can inadvertently be transcribed and may not be corrected upon review.

## 2013-10-20 NOTE — Progress Notes (Signed)
Patient ID: Albert [name], male   DOB: 02-Nov-1955, 58 y.o.   MRN: [id number] Spoke with [facility] telephone. Okay for discharge today from vascular standpoint. Okay to access left arm AV fistula site any point

## 2013-10-20 NOTE — Progress Notes (Signed)
Discharge documentation reviewed with pt; allowing time for questions. Pt verbalized understanding. IV removed without issue. Tele box removed and CCMD notified. Prescriptions given to pt. Pt to leave unit ambulatory when ride arrives.

## 2013-11-01 ENCOUNTER — Ambulatory Visit (INDEPENDENT_AMBULATORY_CARE_PROVIDER_SITE_OTHER): Payer: Commercial Managed Care - HMO | Admitting: *Deleted

## 2013-11-01 DIAGNOSIS — Z7901 Long term (current) use of anticoagulants: Secondary | ICD-10-CM

## 2013-11-01 DIAGNOSIS — I4891 Unspecified atrial fibrillation: Secondary | ICD-10-CM

## 2013-11-01 DIAGNOSIS — Z5181 Encounter for therapeutic drug level monitoring: Secondary | ICD-10-CM

## 2013-11-01 LAB — POCT INR: INR: 1.9

## 2013-11-22 ENCOUNTER — Ambulatory Visit (INDEPENDENT_AMBULATORY_CARE_PROVIDER_SITE_OTHER): Payer: Commercial Managed Care - HMO | Admitting: *Deleted

## 2013-11-22 DIAGNOSIS — Z5181 Encounter for therapeutic drug level monitoring: Secondary | ICD-10-CM

## 2013-11-22 DIAGNOSIS — I4891 Unspecified atrial fibrillation: Secondary | ICD-10-CM

## 2013-11-22 DIAGNOSIS — Z7901 Long term (current) use of anticoagulants: Secondary | ICD-10-CM

## 2013-11-22 LAB — POCT INR: INR: 2.7

## 2013-12-05 ENCOUNTER — Telehealth: Payer: Self-pay | Admitting: Family

## 2013-12-05 ENCOUNTER — Encounter: Payer: Self-pay | Admitting: Family

## 2013-12-05 NOTE — Telephone Encounter (Signed)
Message copied by Gena Fray on Wed Dec 05, 2013  2:18 PM ------      Message from: Denman George      Created: Wed Dec 05, 2013 11:16 AM      Regarding: add on 10/15 with NP       Please schedule 10/15 @ 10:00 AM with NP for suture removal of (L) arm AVF site; s/p shuntogram 8/28; nylon suture in place following procedure. (pt. given appt. already) ------

## 2013-12-06 ENCOUNTER — Ambulatory Visit (INDEPENDENT_AMBULATORY_CARE_PROVIDER_SITE_OTHER): Payer: Self-pay | Admitting: Family

## 2013-12-06 ENCOUNTER — Encounter: Payer: Self-pay | Admitting: Family

## 2013-12-06 VITALS — BP 157/90 | HR 77 | Resp 16 | Ht 74.0 in | Wt 240.0 lb

## 2013-12-06 DIAGNOSIS — N189 Chronic kidney disease, unspecified: Secondary | ICD-10-CM

## 2013-12-06 DIAGNOSIS — Z4802 Encounter for removal of sutures: Secondary | ICD-10-CM

## 2013-12-06 NOTE — Patient Instructions (Signed)
Vascular Access for Hemodialysis A vascular access is a connection between two blood vessels that allows blood to be easily removed from the body and returned to the body during hemodialysis. Hemodialysis is a procedure in which a machine outside of the body filters the blood. There are three types of vascular accesses:   Arteriovenous fistula. This is a connection between an artery and a vein (usually in the arm) that is made by sewing them together. Blood in the artery flows directly into the vein, causing it to get larger over time. This makes it easier for the vein to be used for hemodialysis. An arteriovenous fistula takes 1-6 months to develop after surgery.   Arteriovenous graft. This is a connection between an artery and a vein in the arm that is made with a tube. An arteriovenous graft can be used within 2-3 weeks of surgery.   Venous catheter. This is a thin, flexible tube that is placed in a large vein (usually in the neck, chest, or groin). A venous catheter for hemodialysis contains two tubes that come out of the skin. A venous catheter can be used right away. It is usually used as a temporary access if you need hemodialysis before a fistula or graft has developed. It may also be used as a permanent access if a fistula or graft cannot be created. WHICH TYPE OF ACCESS IS BEST FOR ME? The type of access that is best for you depends on the size and strength of your veins.  A fistula is usually the preferred type of access. It can last several years and is less likely than the other types of accesses to become infected or to cause blood clots within a blood vessel (thrombosis). However, a fistula is not an option for everyone. If your veins are not the right size, a graft may be used instead. Grafts require you to have strong veins. If your veins are not strong enough for a graft, a catheter may be used. Catheters are more likely than fistulas and grafts to become infected or to have thrombosis.   Sometimes, only one type of access is an option. Your health care provider will help you determine which type of access is best for you.  HOW IS A VASCULAR ACCESS USED? The way the access is used depends on the type of access:   If the access is a fistula or graft, two needles are inserted through the skin into the access before each hemodialysis session. Blood leaves the body through one of the needles and travels through a tube to the hemodialysis machine (dialyzer). It then flows through another tube and returns to the body through the second needle.   If the access is a catheter, one tube is connected directly to the tube that leads to the dialyzer and the other is connected to a tube that leads away from the dialyzer. Blood leaves the body through one tube and returns to the body through the other.  WHAT KIND OF PROBLEMS CAN OCCUR WITH VASCULAR ACCESSES?  Blood clots within a blood vessel (thrombosis). Thrombosis can lead to a narrowing of a blood vessel or tube (stenosis). If thrombosis occurs frequently, another access site may be created as a backup.   Infection.  These problems are most likely to occur with a venous catheter and least likely to occur with an arteriovenous fistula.  HOW DO I CARE FOR MY VASCULAR ACCESS? Wear a medical alert bracelet. This tells health care providers that you are   a dialysis patient in the case of an emergency and allows them to care for your veins appropriately. If you have a graft or fistula:   A "bruit" is a noise that is heard with a stethoscope and a "thrill" is a vibration felt over the graft or fistula. The presence of the bruit and thrill indicates that the access is working. You will be taught to feel for the thrill each day. If this is not felt, the access may be clotted. Call your health care provider.   You may use the arm where your vascular access is located freely after the site heals. Keep the following in mind:   Avoid pressure on  the arm.   Avoid lifting heavy objects with the arm.   Avoid sleeping on the arm.   Avoid wearing tight-sleeved shirts or jewelry around the graft or fistula.   Do not allow blood pressure monitoring or needle punctures on the side where the graft or fistula is located.   With permission from your health care provider, you may do exercises to help with blood flow through a fistula. These exercises involve squeezing a rubber ball or other soft objects as instructed. SEEK MEDICAL CARE IF:   Chills develop.   You have an oral temperature above 102 F (38.9 C).  Swelling around the graft or fistula gets worse.   New pain develops.   Pus or other fluid (drainage) is seen at the vascular access site.   Skin redness or red streaking is seen on the skin around, above, or below the vascular access. SEEK IMMEDIATE MEDICAL CARE IF:   Pain, numbness, or an unusual pale skin color develops in the hand on the side of your fistula.   Dizziness or weakness develops that you have not had before.   The vascular access has bleeding that cannot be easily controlled. Document Released: 05/01/2002 Document Revised: 06/25/2013 Document Reviewed: 06/27/2012 ExitCare Patient Information 2015 [employer]. This information is not intended to replace advice given to you by your health care provider. Make sure you discuss any questions you have with your health care provider.  

## 2013-12-06 NOTE — Progress Notes (Signed)
    Postoperative Access Visit   History of Present Illness  Albert [name] is a 58 y.o. year old male who presents with c/o suture needing to be removed from AVF puncture site from 10/19/13 fistulogram by Dr. Donnetta Hutching; the puncture site was closed with a mattress style closing using 3-0 nylon suture.  The patient's left upper arm AVF puncture site wound is healed. The patient denies steal symptoms.  The patient is able to complete their activities of daily living.   The patient denies fever or chills, denies tingling, numbness, or pain in left hand. He states that his nephrologist told him that he will need to start HD in a couple of weeks, has not yet started.   For VQI Use Only  PRE-ADM LIVING: Home  AMB STATUS: Ambulatory  Physical Examination Filed Vitals:   12/06/13 1004  BP: 157/90  Pulse: 77  Resp: 16  Height: 6\' 2"  (1.88 m)  Weight: 240 lb (108.863 kg)  SpO2: 100%   Body mass index is 30.8 kg/(m^2).  One single suture is noted on left upper arm AVF, no erythema, puncture site is sealed and well healed, hand grip is 5/5, sensation in digits is intact, palpable thrill in left upper arm AVF. Bilateral radial pulses are 2+ palpable.  Medical Decision Making  Albert [name] is a 58 y.o. year old male who presents s/p 10/19/13 fistulogram for single suture removal, suture was removed with no suture material remaining.  The patient's access will be ready for use in 2 weeks.  Follow up as needed.  Thank you for allowing [place] to participate in this patient's care.  Hajira Verhagen, Sharmon Leyden, RN, MSN, FNP-C [facility] Office: [phone]  12/06/2013, 9:55 AM  Clinic MD: Oneida Alar

## 2013-12-19 ENCOUNTER — Ambulatory Visit (INDEPENDENT_AMBULATORY_CARE_PROVIDER_SITE_OTHER): Payer: Commercial Managed Care - HMO | Admitting: *Deleted

## 2013-12-19 DIAGNOSIS — Z7901 Long term (current) use of anticoagulants: Secondary | ICD-10-CM

## 2013-12-19 DIAGNOSIS — Z5181 Encounter for therapeutic drug level monitoring: Secondary | ICD-10-CM

## 2013-12-19 DIAGNOSIS — I4891 Unspecified atrial fibrillation: Secondary | ICD-10-CM

## 2013-12-19 DIAGNOSIS — I480 Paroxysmal atrial fibrillation: Secondary | ICD-10-CM

## 2013-12-19 LAB — POCT INR: INR: 2.9

## 2014-01-14 ENCOUNTER — Ambulatory Visit (INDEPENDENT_AMBULATORY_CARE_PROVIDER_SITE_OTHER): Payer: Commercial Managed Care - HMO | Admitting: *Deleted

## 2014-01-14 DIAGNOSIS — Z7901 Long term (current) use of anticoagulants: Secondary | ICD-10-CM

## 2014-01-14 DIAGNOSIS — Z5181 Encounter for therapeutic drug level monitoring: Secondary | ICD-10-CM

## 2014-01-14 DIAGNOSIS — I4891 Unspecified atrial fibrillation: Secondary | ICD-10-CM

## 2014-01-14 LAB — POCT INR: INR: 1.4

## 2014-01-28 ENCOUNTER — Ambulatory Visit (INDEPENDENT_AMBULATORY_CARE_PROVIDER_SITE_OTHER): Payer: Commercial Managed Care - HMO | Admitting: *Deleted

## 2014-01-28 DIAGNOSIS — Z5181 Encounter for therapeutic drug level monitoring: Secondary | ICD-10-CM

## 2014-01-28 DIAGNOSIS — I4891 Unspecified atrial fibrillation: Secondary | ICD-10-CM

## 2014-01-28 DIAGNOSIS — Z7901 Long term (current) use of anticoagulants: Secondary | ICD-10-CM

## 2014-01-28 LAB — POCT INR: INR: 1.6

## 2014-01-31 ENCOUNTER — Encounter (HOSPITAL_COMMUNITY): Payer: Self-pay | Admitting: Vascular Surgery

## 2014-02-11 ENCOUNTER — Ambulatory Visit (INDEPENDENT_AMBULATORY_CARE_PROVIDER_SITE_OTHER): Payer: Commercial Managed Care - HMO | Admitting: *Deleted

## 2014-02-11 DIAGNOSIS — I4891 Unspecified atrial fibrillation: Secondary | ICD-10-CM

## 2014-02-11 DIAGNOSIS — Z7901 Long term (current) use of anticoagulants: Secondary | ICD-10-CM

## 2014-02-11 DIAGNOSIS — Z5181 Encounter for therapeutic drug level monitoring: Secondary | ICD-10-CM

## 2014-02-11 LAB — POCT INR: INR: 1.5

## 2014-02-23 DIAGNOSIS — N186 End stage renal disease: Secondary | ICD-10-CM | POA: Diagnosis not present

## 2014-02-23 DIAGNOSIS — D509 Iron deficiency anemia, unspecified: Secondary | ICD-10-CM | POA: Diagnosis not present

## 2014-02-23 DIAGNOSIS — N2581 Secondary hyperparathyroidism of renal origin: Secondary | ICD-10-CM | POA: Diagnosis not present

## 2014-02-23 DIAGNOSIS — Z23 Encounter for immunization: Secondary | ICD-10-CM | POA: Diagnosis not present

## 2014-02-23 DIAGNOSIS — Z992 Dependence on renal dialysis: Secondary | ICD-10-CM | POA: Diagnosis not present

## 2014-02-25 DIAGNOSIS — T82858D Stenosis of vascular prosthetic devices, implants and grafts, subsequent encounter: Secondary | ICD-10-CM | POA: Diagnosis not present

## 2014-02-25 DIAGNOSIS — N186 End stage renal disease: Secondary | ICD-10-CM | POA: Diagnosis not present

## 2014-02-25 DIAGNOSIS — I871 Compression of vein: Secondary | ICD-10-CM | POA: Diagnosis not present

## 2014-02-25 DIAGNOSIS — Z992 Dependence on renal dialysis: Secondary | ICD-10-CM | POA: Diagnosis not present

## 2014-02-26 DIAGNOSIS — Z992 Dependence on renal dialysis: Secondary | ICD-10-CM | POA: Diagnosis not present

## 2014-02-26 DIAGNOSIS — D509 Iron deficiency anemia, unspecified: Secondary | ICD-10-CM | POA: Diagnosis not present

## 2014-02-26 DIAGNOSIS — Z23 Encounter for immunization: Secondary | ICD-10-CM | POA: Diagnosis not present

## 2014-02-26 DIAGNOSIS — N2581 Secondary hyperparathyroidism of renal origin: Secondary | ICD-10-CM | POA: Diagnosis not present

## 2014-02-26 DIAGNOSIS — N186 End stage renal disease: Secondary | ICD-10-CM | POA: Diagnosis not present

## 2014-02-27 ENCOUNTER — Ambulatory Visit (INDEPENDENT_AMBULATORY_CARE_PROVIDER_SITE_OTHER): Payer: Commercial Managed Care - HMO | Admitting: *Deleted

## 2014-02-27 DIAGNOSIS — I4891 Unspecified atrial fibrillation: Secondary | ICD-10-CM | POA: Diagnosis not present

## 2014-02-27 DIAGNOSIS — Z5181 Encounter for therapeutic drug level monitoring: Secondary | ICD-10-CM

## 2014-02-27 DIAGNOSIS — Z7901 Long term (current) use of anticoagulants: Secondary | ICD-10-CM

## 2014-02-27 LAB — POCT INR: INR: 1.7

## 2014-02-28 DIAGNOSIS — Z23 Encounter for immunization: Secondary | ICD-10-CM | POA: Diagnosis not present

## 2014-02-28 DIAGNOSIS — N186 End stage renal disease: Secondary | ICD-10-CM | POA: Diagnosis not present

## 2014-02-28 DIAGNOSIS — Z992 Dependence on renal dialysis: Secondary | ICD-10-CM | POA: Diagnosis not present

## 2014-02-28 DIAGNOSIS — N2581 Secondary hyperparathyroidism of renal origin: Secondary | ICD-10-CM | POA: Diagnosis not present

## 2014-02-28 DIAGNOSIS — D509 Iron deficiency anemia, unspecified: Secondary | ICD-10-CM | POA: Diagnosis not present

## 2014-03-02 DIAGNOSIS — Z992 Dependence on renal dialysis: Secondary | ICD-10-CM | POA: Diagnosis not present

## 2014-03-02 DIAGNOSIS — N2581 Secondary hyperparathyroidism of renal origin: Secondary | ICD-10-CM | POA: Diagnosis not present

## 2014-03-02 DIAGNOSIS — Z23 Encounter for immunization: Secondary | ICD-10-CM | POA: Diagnosis not present

## 2014-03-02 DIAGNOSIS — D509 Iron deficiency anemia, unspecified: Secondary | ICD-10-CM | POA: Diagnosis not present

## 2014-03-02 DIAGNOSIS — N186 End stage renal disease: Secondary | ICD-10-CM | POA: Diagnosis not present

## 2014-03-05 DIAGNOSIS — N186 End stage renal disease: Secondary | ICD-10-CM | POA: Diagnosis not present

## 2014-03-05 DIAGNOSIS — D509 Iron deficiency anemia, unspecified: Secondary | ICD-10-CM | POA: Diagnosis not present

## 2014-03-05 DIAGNOSIS — N2581 Secondary hyperparathyroidism of renal origin: Secondary | ICD-10-CM | POA: Diagnosis not present

## 2014-03-05 DIAGNOSIS — Z23 Encounter for immunization: Secondary | ICD-10-CM | POA: Diagnosis not present

## 2014-03-05 DIAGNOSIS — Z992 Dependence on renal dialysis: Secondary | ICD-10-CM | POA: Diagnosis not present

## 2014-03-07 DIAGNOSIS — Z23 Encounter for immunization: Secondary | ICD-10-CM | POA: Diagnosis not present

## 2014-03-07 DIAGNOSIS — Z992 Dependence on renal dialysis: Secondary | ICD-10-CM | POA: Diagnosis not present

## 2014-03-07 DIAGNOSIS — D509 Iron deficiency anemia, unspecified: Secondary | ICD-10-CM | POA: Diagnosis not present

## 2014-03-07 DIAGNOSIS — N186 End stage renal disease: Secondary | ICD-10-CM | POA: Diagnosis not present

## 2014-03-07 DIAGNOSIS — N2581 Secondary hyperparathyroidism of renal origin: Secondary | ICD-10-CM | POA: Diagnosis not present

## 2014-03-09 DIAGNOSIS — Z23 Encounter for immunization: Secondary | ICD-10-CM | POA: Diagnosis not present

## 2014-03-09 DIAGNOSIS — Z992 Dependence on renal dialysis: Secondary | ICD-10-CM | POA: Diagnosis not present

## 2014-03-09 DIAGNOSIS — N2581 Secondary hyperparathyroidism of renal origin: Secondary | ICD-10-CM | POA: Diagnosis not present

## 2014-03-09 DIAGNOSIS — D509 Iron deficiency anemia, unspecified: Secondary | ICD-10-CM | POA: Diagnosis not present

## 2014-03-09 DIAGNOSIS — N186 End stage renal disease: Secondary | ICD-10-CM | POA: Diagnosis not present

## 2014-03-12 DIAGNOSIS — N186 End stage renal disease: Secondary | ICD-10-CM | POA: Diagnosis not present

## 2014-03-12 DIAGNOSIS — Z992 Dependence on renal dialysis: Secondary | ICD-10-CM | POA: Diagnosis not present

## 2014-03-12 DIAGNOSIS — N2581 Secondary hyperparathyroidism of renal origin: Secondary | ICD-10-CM | POA: Diagnosis not present

## 2014-03-12 DIAGNOSIS — Z23 Encounter for immunization: Secondary | ICD-10-CM | POA: Diagnosis not present

## 2014-03-12 DIAGNOSIS — D509 Iron deficiency anemia, unspecified: Secondary | ICD-10-CM | POA: Diagnosis not present

## 2014-03-14 DIAGNOSIS — N2581 Secondary hyperparathyroidism of renal origin: Secondary | ICD-10-CM | POA: Diagnosis not present

## 2014-03-14 DIAGNOSIS — Z992 Dependence on renal dialysis: Secondary | ICD-10-CM | POA: Diagnosis not present

## 2014-03-14 DIAGNOSIS — N186 End stage renal disease: Secondary | ICD-10-CM | POA: Diagnosis not present

## 2014-03-14 DIAGNOSIS — D509 Iron deficiency anemia, unspecified: Secondary | ICD-10-CM | POA: Diagnosis not present

## 2014-03-14 DIAGNOSIS — Z23 Encounter for immunization: Secondary | ICD-10-CM | POA: Diagnosis not present

## 2014-03-17 DIAGNOSIS — Z992 Dependence on renal dialysis: Secondary | ICD-10-CM | POA: Diagnosis not present

## 2014-03-17 DIAGNOSIS — N2581 Secondary hyperparathyroidism of renal origin: Secondary | ICD-10-CM | POA: Diagnosis not present

## 2014-03-17 DIAGNOSIS — Z23 Encounter for immunization: Secondary | ICD-10-CM | POA: Diagnosis not present

## 2014-03-17 DIAGNOSIS — N186 End stage renal disease: Secondary | ICD-10-CM | POA: Diagnosis not present

## 2014-03-17 DIAGNOSIS — D509 Iron deficiency anemia, unspecified: Secondary | ICD-10-CM | POA: Diagnosis not present

## 2014-03-18 ENCOUNTER — Ambulatory Visit (INDEPENDENT_AMBULATORY_CARE_PROVIDER_SITE_OTHER): Payer: Commercial Managed Care - HMO | Admitting: *Deleted

## 2014-03-18 DIAGNOSIS — Z5181 Encounter for therapeutic drug level monitoring: Secondary | ICD-10-CM

## 2014-03-18 DIAGNOSIS — Z7901 Long term (current) use of anticoagulants: Secondary | ICD-10-CM

## 2014-03-18 DIAGNOSIS — I4891 Unspecified atrial fibrillation: Secondary | ICD-10-CM

## 2014-03-18 LAB — POCT INR: INR: 1.9

## 2014-03-19 DIAGNOSIS — Z992 Dependence on renal dialysis: Secondary | ICD-10-CM | POA: Diagnosis not present

## 2014-03-19 DIAGNOSIS — Z23 Encounter for immunization: Secondary | ICD-10-CM | POA: Diagnosis not present

## 2014-03-19 DIAGNOSIS — N2581 Secondary hyperparathyroidism of renal origin: Secondary | ICD-10-CM | POA: Diagnosis not present

## 2014-03-19 DIAGNOSIS — D509 Iron deficiency anemia, unspecified: Secondary | ICD-10-CM | POA: Diagnosis not present

## 2014-03-19 DIAGNOSIS — N186 End stage renal disease: Secondary | ICD-10-CM | POA: Diagnosis not present

## 2014-03-21 DIAGNOSIS — Z992 Dependence on renal dialysis: Secondary | ICD-10-CM | POA: Diagnosis not present

## 2014-03-21 DIAGNOSIS — Z23 Encounter for immunization: Secondary | ICD-10-CM | POA: Diagnosis not present

## 2014-03-21 DIAGNOSIS — N2581 Secondary hyperparathyroidism of renal origin: Secondary | ICD-10-CM | POA: Diagnosis not present

## 2014-03-21 DIAGNOSIS — D509 Iron deficiency anemia, unspecified: Secondary | ICD-10-CM | POA: Diagnosis not present

## 2014-03-21 DIAGNOSIS — N186 End stage renal disease: Secondary | ICD-10-CM | POA: Diagnosis not present

## 2014-03-23 DIAGNOSIS — N2581 Secondary hyperparathyroidism of renal origin: Secondary | ICD-10-CM | POA: Diagnosis not present

## 2014-03-23 DIAGNOSIS — N186 End stage renal disease: Secondary | ICD-10-CM | POA: Diagnosis not present

## 2014-03-23 DIAGNOSIS — Z992 Dependence on renal dialysis: Secondary | ICD-10-CM | POA: Diagnosis not present

## 2014-03-23 DIAGNOSIS — D509 Iron deficiency anemia, unspecified: Secondary | ICD-10-CM | POA: Diagnosis not present

## 2014-03-23 DIAGNOSIS — Z23 Encounter for immunization: Secondary | ICD-10-CM | POA: Diagnosis not present

## 2014-03-24 DIAGNOSIS — Z992 Dependence on renal dialysis: Secondary | ICD-10-CM | POA: Diagnosis not present

## 2014-03-24 DIAGNOSIS — N186 End stage renal disease: Secondary | ICD-10-CM | POA: Diagnosis not present

## 2014-03-26 DIAGNOSIS — N186 End stage renal disease: Secondary | ICD-10-CM | POA: Diagnosis not present

## 2014-03-26 DIAGNOSIS — Z992 Dependence on renal dialysis: Secondary | ICD-10-CM | POA: Diagnosis not present

## 2014-03-26 DIAGNOSIS — D509 Iron deficiency anemia, unspecified: Secondary | ICD-10-CM | POA: Diagnosis not present

## 2014-03-26 DIAGNOSIS — Z23 Encounter for immunization: Secondary | ICD-10-CM | POA: Diagnosis not present

## 2014-03-28 DIAGNOSIS — Z23 Encounter for immunization: Secondary | ICD-10-CM | POA: Diagnosis not present

## 2014-03-28 DIAGNOSIS — N186 End stage renal disease: Secondary | ICD-10-CM | POA: Diagnosis not present

## 2014-03-28 DIAGNOSIS — D509 Iron deficiency anemia, unspecified: Secondary | ICD-10-CM | POA: Diagnosis not present

## 2014-03-28 DIAGNOSIS — Z992 Dependence on renal dialysis: Secondary | ICD-10-CM | POA: Diagnosis not present

## 2014-03-30 DIAGNOSIS — N186 End stage renal disease: Secondary | ICD-10-CM | POA: Diagnosis not present

## 2014-03-30 DIAGNOSIS — Z23 Encounter for immunization: Secondary | ICD-10-CM | POA: Diagnosis not present

## 2014-03-30 DIAGNOSIS — D509 Iron deficiency anemia, unspecified: Secondary | ICD-10-CM | POA: Diagnosis not present

## 2014-03-30 DIAGNOSIS — Z992 Dependence on renal dialysis: Secondary | ICD-10-CM | POA: Diagnosis not present

## 2014-04-01 ENCOUNTER — Ambulatory Visit (INDEPENDENT_AMBULATORY_CARE_PROVIDER_SITE_OTHER): Payer: Commercial Managed Care - HMO | Admitting: *Deleted

## 2014-04-01 DIAGNOSIS — I480 Paroxysmal atrial fibrillation: Secondary | ICD-10-CM | POA: Diagnosis not present

## 2014-04-01 DIAGNOSIS — I4891 Unspecified atrial fibrillation: Secondary | ICD-10-CM

## 2014-04-01 DIAGNOSIS — Z7901 Long term (current) use of anticoagulants: Secondary | ICD-10-CM

## 2014-04-01 DIAGNOSIS — Z5181 Encounter for therapeutic drug level monitoring: Secondary | ICD-10-CM | POA: Diagnosis not present

## 2014-04-01 LAB — POCT INR: INR: 2.2

## 2014-04-02 DIAGNOSIS — Z23 Encounter for immunization: Secondary | ICD-10-CM | POA: Diagnosis not present

## 2014-04-02 DIAGNOSIS — N186 End stage renal disease: Secondary | ICD-10-CM | POA: Diagnosis not present

## 2014-04-02 DIAGNOSIS — Z992 Dependence on renal dialysis: Secondary | ICD-10-CM | POA: Diagnosis not present

## 2014-04-02 DIAGNOSIS — D509 Iron deficiency anemia, unspecified: Secondary | ICD-10-CM | POA: Diagnosis not present

## 2014-04-04 DIAGNOSIS — D509 Iron deficiency anemia, unspecified: Secondary | ICD-10-CM | POA: Diagnosis not present

## 2014-04-04 DIAGNOSIS — Z23 Encounter for immunization: Secondary | ICD-10-CM | POA: Diagnosis not present

## 2014-04-04 DIAGNOSIS — N186 End stage renal disease: Secondary | ICD-10-CM | POA: Diagnosis not present

## 2014-04-04 DIAGNOSIS — Z992 Dependence on renal dialysis: Secondary | ICD-10-CM | POA: Diagnosis not present

## 2014-04-06 DIAGNOSIS — Z992 Dependence on renal dialysis: Secondary | ICD-10-CM | POA: Diagnosis not present

## 2014-04-06 DIAGNOSIS — Z23 Encounter for immunization: Secondary | ICD-10-CM | POA: Diagnosis not present

## 2014-04-06 DIAGNOSIS — D509 Iron deficiency anemia, unspecified: Secondary | ICD-10-CM | POA: Diagnosis not present

## 2014-04-06 DIAGNOSIS — N186 End stage renal disease: Secondary | ICD-10-CM | POA: Diagnosis not present

## 2014-04-09 DIAGNOSIS — Z23 Encounter for immunization: Secondary | ICD-10-CM | POA: Diagnosis not present

## 2014-04-09 DIAGNOSIS — Z992 Dependence on renal dialysis: Secondary | ICD-10-CM | POA: Diagnosis not present

## 2014-04-09 DIAGNOSIS — N186 End stage renal disease: Secondary | ICD-10-CM | POA: Diagnosis not present

## 2014-04-09 DIAGNOSIS — D509 Iron deficiency anemia, unspecified: Secondary | ICD-10-CM | POA: Diagnosis not present

## 2014-04-11 DIAGNOSIS — Z23 Encounter for immunization: Secondary | ICD-10-CM | POA: Diagnosis not present

## 2014-04-11 DIAGNOSIS — N186 End stage renal disease: Secondary | ICD-10-CM | POA: Diagnosis not present

## 2014-04-11 DIAGNOSIS — Z992 Dependence on renal dialysis: Secondary | ICD-10-CM | POA: Diagnosis not present

## 2014-04-11 DIAGNOSIS — D509 Iron deficiency anemia, unspecified: Secondary | ICD-10-CM | POA: Diagnosis not present

## 2014-04-13 DIAGNOSIS — Z23 Encounter for immunization: Secondary | ICD-10-CM | POA: Diagnosis not present

## 2014-04-13 DIAGNOSIS — D509 Iron deficiency anemia, unspecified: Secondary | ICD-10-CM | POA: Diagnosis not present

## 2014-04-13 DIAGNOSIS — N186 End stage renal disease: Secondary | ICD-10-CM | POA: Diagnosis not present

## 2014-04-13 DIAGNOSIS — Z992 Dependence on renal dialysis: Secondary | ICD-10-CM | POA: Diagnosis not present

## 2014-04-16 DIAGNOSIS — Z992 Dependence on renal dialysis: Secondary | ICD-10-CM | POA: Diagnosis not present

## 2014-04-16 DIAGNOSIS — D509 Iron deficiency anemia, unspecified: Secondary | ICD-10-CM | POA: Diagnosis not present

## 2014-04-16 DIAGNOSIS — N186 End stage renal disease: Secondary | ICD-10-CM | POA: Diagnosis not present

## 2014-04-16 DIAGNOSIS — Z23 Encounter for immunization: Secondary | ICD-10-CM | POA: Diagnosis not present

## 2014-04-17 DIAGNOSIS — E119 Type 2 diabetes mellitus without complications: Secondary | ICD-10-CM | POA: Diagnosis not present

## 2014-04-18 DIAGNOSIS — D509 Iron deficiency anemia, unspecified: Secondary | ICD-10-CM | POA: Diagnosis not present

## 2014-04-18 DIAGNOSIS — Z23 Encounter for immunization: Secondary | ICD-10-CM | POA: Diagnosis not present

## 2014-04-18 DIAGNOSIS — Z992 Dependence on renal dialysis: Secondary | ICD-10-CM | POA: Diagnosis not present

## 2014-04-18 DIAGNOSIS — N186 End stage renal disease: Secondary | ICD-10-CM | POA: Diagnosis not present

## 2014-04-20 DIAGNOSIS — N186 End stage renal disease: Secondary | ICD-10-CM | POA: Diagnosis not present

## 2014-04-20 DIAGNOSIS — Z992 Dependence on renal dialysis: Secondary | ICD-10-CM | POA: Diagnosis not present

## 2014-04-20 DIAGNOSIS — Z23 Encounter for immunization: Secondary | ICD-10-CM | POA: Diagnosis not present

## 2014-04-20 DIAGNOSIS — D509 Iron deficiency anemia, unspecified: Secondary | ICD-10-CM | POA: Diagnosis not present

## 2014-04-22 DIAGNOSIS — N186 End stage renal disease: Secondary | ICD-10-CM | POA: Diagnosis not present

## 2014-04-22 DIAGNOSIS — Z992 Dependence on renal dialysis: Secondary | ICD-10-CM | POA: Diagnosis not present

## 2014-04-23 DIAGNOSIS — Z23 Encounter for immunization: Secondary | ICD-10-CM | POA: Diagnosis not present

## 2014-04-23 DIAGNOSIS — N186 End stage renal disease: Secondary | ICD-10-CM | POA: Diagnosis not present

## 2014-04-23 DIAGNOSIS — Z992 Dependence on renal dialysis: Secondary | ICD-10-CM | POA: Diagnosis not present

## 2014-04-23 DIAGNOSIS — D509 Iron deficiency anemia, unspecified: Secondary | ICD-10-CM | POA: Diagnosis not present

## 2014-04-25 DIAGNOSIS — N186 End stage renal disease: Secondary | ICD-10-CM | POA: Diagnosis not present

## 2014-04-25 DIAGNOSIS — Z992 Dependence on renal dialysis: Secondary | ICD-10-CM | POA: Diagnosis not present

## 2014-04-25 DIAGNOSIS — D509 Iron deficiency anemia, unspecified: Secondary | ICD-10-CM | POA: Diagnosis not present

## 2014-04-25 DIAGNOSIS — Z23 Encounter for immunization: Secondary | ICD-10-CM | POA: Diagnosis not present

## 2014-04-27 DIAGNOSIS — N186 End stage renal disease: Secondary | ICD-10-CM | POA: Diagnosis not present

## 2014-04-27 DIAGNOSIS — Z23 Encounter for immunization: Secondary | ICD-10-CM | POA: Diagnosis not present

## 2014-04-27 DIAGNOSIS — Z992 Dependence on renal dialysis: Secondary | ICD-10-CM | POA: Diagnosis not present

## 2014-04-27 DIAGNOSIS — D509 Iron deficiency anemia, unspecified: Secondary | ICD-10-CM | POA: Diagnosis not present

## 2014-04-29 ENCOUNTER — Ambulatory Visit (INDEPENDENT_AMBULATORY_CARE_PROVIDER_SITE_OTHER): Payer: Commercial Managed Care - HMO | Admitting: *Deleted

## 2014-04-29 DIAGNOSIS — Z5181 Encounter for therapeutic drug level monitoring: Secondary | ICD-10-CM

## 2014-04-29 DIAGNOSIS — Z7901 Long term (current) use of anticoagulants: Secondary | ICD-10-CM | POA: Diagnosis not present

## 2014-04-29 DIAGNOSIS — I480 Paroxysmal atrial fibrillation: Secondary | ICD-10-CM

## 2014-04-29 DIAGNOSIS — I4891 Unspecified atrial fibrillation: Secondary | ICD-10-CM | POA: Diagnosis not present

## 2014-04-29 LAB — POCT INR: INR: 1.6

## 2014-04-30 DIAGNOSIS — N186 End stage renal disease: Secondary | ICD-10-CM | POA: Diagnosis not present

## 2014-04-30 DIAGNOSIS — Z992 Dependence on renal dialysis: Secondary | ICD-10-CM | POA: Diagnosis not present

## 2014-04-30 DIAGNOSIS — Z23 Encounter for immunization: Secondary | ICD-10-CM | POA: Diagnosis not present

## 2014-04-30 DIAGNOSIS — D509 Iron deficiency anemia, unspecified: Secondary | ICD-10-CM | POA: Diagnosis not present

## 2014-05-02 DIAGNOSIS — D509 Iron deficiency anemia, unspecified: Secondary | ICD-10-CM | POA: Diagnosis not present

## 2014-05-02 DIAGNOSIS — Z23 Encounter for immunization: Secondary | ICD-10-CM | POA: Diagnosis not present

## 2014-05-02 DIAGNOSIS — Z992 Dependence on renal dialysis: Secondary | ICD-10-CM | POA: Diagnosis not present

## 2014-05-02 DIAGNOSIS — N186 End stage renal disease: Secondary | ICD-10-CM | POA: Diagnosis not present

## 2014-05-04 DIAGNOSIS — D509 Iron deficiency anemia, unspecified: Secondary | ICD-10-CM | POA: Diagnosis not present

## 2014-05-04 DIAGNOSIS — Z23 Encounter for immunization: Secondary | ICD-10-CM | POA: Diagnosis not present

## 2014-05-04 DIAGNOSIS — N186 End stage renal disease: Secondary | ICD-10-CM | POA: Diagnosis not present

## 2014-05-04 DIAGNOSIS — Z992 Dependence on renal dialysis: Secondary | ICD-10-CM | POA: Diagnosis not present

## 2014-05-07 DIAGNOSIS — Z23 Encounter for immunization: Secondary | ICD-10-CM | POA: Diagnosis not present

## 2014-05-07 DIAGNOSIS — Z992 Dependence on renal dialysis: Secondary | ICD-10-CM | POA: Diagnosis not present

## 2014-05-07 DIAGNOSIS — D509 Iron deficiency anemia, unspecified: Secondary | ICD-10-CM | POA: Diagnosis not present

## 2014-05-07 DIAGNOSIS — N186 End stage renal disease: Secondary | ICD-10-CM | POA: Diagnosis not present

## 2014-05-09 DIAGNOSIS — Z992 Dependence on renal dialysis: Secondary | ICD-10-CM | POA: Diagnosis not present

## 2014-05-09 DIAGNOSIS — Z23 Encounter for immunization: Secondary | ICD-10-CM | POA: Diagnosis not present

## 2014-05-09 DIAGNOSIS — N186 End stage renal disease: Secondary | ICD-10-CM | POA: Diagnosis not present

## 2014-05-09 DIAGNOSIS — D509 Iron deficiency anemia, unspecified: Secondary | ICD-10-CM | POA: Diagnosis not present

## 2014-05-11 DIAGNOSIS — Z23 Encounter for immunization: Secondary | ICD-10-CM | POA: Diagnosis not present

## 2014-05-11 DIAGNOSIS — Z992 Dependence on renal dialysis: Secondary | ICD-10-CM | POA: Diagnosis not present

## 2014-05-11 DIAGNOSIS — N186 End stage renal disease: Secondary | ICD-10-CM | POA: Diagnosis not present

## 2014-05-11 DIAGNOSIS — D509 Iron deficiency anemia, unspecified: Secondary | ICD-10-CM | POA: Diagnosis not present

## 2014-05-12 ENCOUNTER — Other Ambulatory Visit: Payer: Self-pay | Admitting: Cardiology

## 2014-05-14 DIAGNOSIS — N186 End stage renal disease: Secondary | ICD-10-CM | POA: Diagnosis not present

## 2014-05-14 DIAGNOSIS — D509 Iron deficiency anemia, unspecified: Secondary | ICD-10-CM | POA: Diagnosis not present

## 2014-05-14 DIAGNOSIS — Z23 Encounter for immunization: Secondary | ICD-10-CM | POA: Diagnosis not present

## 2014-05-14 DIAGNOSIS — Z992 Dependence on renal dialysis: Secondary | ICD-10-CM | POA: Diagnosis not present

## 2014-05-16 DIAGNOSIS — Z23 Encounter for immunization: Secondary | ICD-10-CM | POA: Diagnosis not present

## 2014-05-16 DIAGNOSIS — D509 Iron deficiency anemia, unspecified: Secondary | ICD-10-CM | POA: Diagnosis not present

## 2014-05-16 DIAGNOSIS — Z992 Dependence on renal dialysis: Secondary | ICD-10-CM | POA: Diagnosis not present

## 2014-05-16 DIAGNOSIS — N186 End stage renal disease: Secondary | ICD-10-CM | POA: Diagnosis not present

## 2014-05-17 DIAGNOSIS — R040 Epistaxis: Secondary | ICD-10-CM | POA: Diagnosis not present

## 2014-05-17 DIAGNOSIS — E663 Overweight: Secondary | ICD-10-CM | POA: Diagnosis not present

## 2014-05-17 DIAGNOSIS — Z7901 Long term (current) use of anticoagulants: Secondary | ICD-10-CM | POA: Diagnosis not present

## 2014-05-17 DIAGNOSIS — Z6827 Body mass index (BMI) 27.0-27.9, adult: Secondary | ICD-10-CM | POA: Diagnosis not present

## 2014-05-17 DIAGNOSIS — I4891 Unspecified atrial fibrillation: Secondary | ICD-10-CM | POA: Diagnosis not present

## 2014-05-18 DIAGNOSIS — N186 End stage renal disease: Secondary | ICD-10-CM | POA: Diagnosis not present

## 2014-05-18 DIAGNOSIS — Z23 Encounter for immunization: Secondary | ICD-10-CM | POA: Diagnosis not present

## 2014-05-18 DIAGNOSIS — Z992 Dependence on renal dialysis: Secondary | ICD-10-CM | POA: Diagnosis not present

## 2014-05-18 DIAGNOSIS — D509 Iron deficiency anemia, unspecified: Secondary | ICD-10-CM | POA: Diagnosis not present

## 2014-05-20 ENCOUNTER — Ambulatory Visit (INDEPENDENT_AMBULATORY_CARE_PROVIDER_SITE_OTHER): Payer: Commercial Managed Care - HMO | Admitting: *Deleted

## 2014-05-20 DIAGNOSIS — I4891 Unspecified atrial fibrillation: Secondary | ICD-10-CM | POA: Diagnosis not present

## 2014-05-20 DIAGNOSIS — I480 Paroxysmal atrial fibrillation: Secondary | ICD-10-CM

## 2014-05-20 DIAGNOSIS — Z7901 Long term (current) use of anticoagulants: Secondary | ICD-10-CM

## 2014-05-20 DIAGNOSIS — Z5181 Encounter for therapeutic drug level monitoring: Secondary | ICD-10-CM | POA: Diagnosis not present

## 2014-05-20 LAB — POCT INR: INR: 1.9

## 2014-05-21 DIAGNOSIS — D509 Iron deficiency anemia, unspecified: Secondary | ICD-10-CM | POA: Diagnosis not present

## 2014-05-21 DIAGNOSIS — N186 End stage renal disease: Secondary | ICD-10-CM | POA: Diagnosis not present

## 2014-05-21 DIAGNOSIS — Z23 Encounter for immunization: Secondary | ICD-10-CM | POA: Diagnosis not present

## 2014-05-21 DIAGNOSIS — Z992 Dependence on renal dialysis: Secondary | ICD-10-CM | POA: Diagnosis not present

## 2014-05-23 DIAGNOSIS — Z23 Encounter for immunization: Secondary | ICD-10-CM | POA: Diagnosis not present

## 2014-05-23 DIAGNOSIS — D509 Iron deficiency anemia, unspecified: Secondary | ICD-10-CM | POA: Diagnosis not present

## 2014-05-23 DIAGNOSIS — N186 End stage renal disease: Secondary | ICD-10-CM | POA: Diagnosis not present

## 2014-05-23 DIAGNOSIS — Z992 Dependence on renal dialysis: Secondary | ICD-10-CM | POA: Diagnosis not present

## 2014-05-25 DIAGNOSIS — N2581 Secondary hyperparathyroidism of renal origin: Secondary | ICD-10-CM | POA: Diagnosis not present

## 2014-05-25 DIAGNOSIS — N186 End stage renal disease: Secondary | ICD-10-CM | POA: Diagnosis not present

## 2014-05-25 DIAGNOSIS — D509 Iron deficiency anemia, unspecified: Secondary | ICD-10-CM | POA: Diagnosis not present

## 2014-05-25 DIAGNOSIS — Z992 Dependence on renal dialysis: Secondary | ICD-10-CM | POA: Diagnosis not present

## 2014-05-28 DIAGNOSIS — N2581 Secondary hyperparathyroidism of renal origin: Secondary | ICD-10-CM | POA: Diagnosis not present

## 2014-05-28 DIAGNOSIS — N186 End stage renal disease: Secondary | ICD-10-CM | POA: Diagnosis not present

## 2014-05-28 DIAGNOSIS — D509 Iron deficiency anemia, unspecified: Secondary | ICD-10-CM | POA: Diagnosis not present

## 2014-05-28 DIAGNOSIS — Z992 Dependence on renal dialysis: Secondary | ICD-10-CM | POA: Diagnosis not present

## 2014-05-29 ENCOUNTER — Encounter: Payer: Self-pay | Admitting: Cardiology

## 2014-05-29 ENCOUNTER — Ambulatory Visit (INDEPENDENT_AMBULATORY_CARE_PROVIDER_SITE_OTHER): Payer: Commercial Managed Care - HMO | Admitting: Cardiology

## 2014-05-29 VITALS — BP 138/76 | HR 88 | Ht 74.0 in | Wt 222.0 lb

## 2014-05-29 DIAGNOSIS — I10 Essential (primary) hypertension: Secondary | ICD-10-CM | POA: Diagnosis not present

## 2014-05-29 DIAGNOSIS — N186 End stage renal disease: Secondary | ICD-10-CM

## 2014-05-29 DIAGNOSIS — I482 Chronic atrial fibrillation, unspecified: Secondary | ICD-10-CM

## 2014-05-29 DIAGNOSIS — Z992 Dependence on renal dialysis: Secondary | ICD-10-CM

## 2014-05-29 DIAGNOSIS — Z8679 Personal history of other diseases of the circulatory system: Secondary | ICD-10-CM

## 2014-05-29 NOTE — Progress Notes (Signed)
Cardiology Office Note  Date: 05/29/2014   ID: Albert [name], DOB [birth year], MRN [id number]  PCP: Albert Morel, MD  Primary Cardiologist: Albert Lesches, MD   Chief Complaint  Patient presents with  . Cardiomyopathy  . PAF    History of Present Illness: Albert [name] is a 59 y.o. male last seen in March 2015. He presents for a routine visit. Since I last saw him he has progressed to hemodialysis, still under the care of Dr. Lowanda [name]. He tells me that he feels much better in general, has more energy, has lost further weight. His medical regimen is also simplified. He is trying to do some walking for exercise.  He continues on Coumadin with follow-up in the anticoagulation clinic. Reports no spontaneous bleeding problems. He has not been bothered by any palpitations with atrial fibrillation.  Last echocardiogram from 2013 as noted below, normalization of LVEF documented.   Past Medical History  Diagnosis Date  . Renal cell cancer   . Mixed hyperlipidemia   . Type 2 diabetes mellitus   . Paroxysmal atrial fibrillation   . Essential hypertension, benign   . ESRD on hemodialysis     Dr. Lowanda [name]  . Gout   . Diverticulosis of colon   . Colonic polyp   . Cardiomyopathy     LVEF 50-55% 8/11 - [facility]  . OSA (obstructive sleep apnea)     CPAP    Past Surgical History  Procedure Laterality Date  . Right nephrectomy    . Left arm av fistula  2009    Dr. Scot [name]  . Fistulogram N/A 10/19/2013    Procedure: FISTULOGRAM;  Surgeon: Albert Posner, MD;  Location:  [facility] CATH LAB;  Service: Cardiovascular;  Laterality: N/A;    Current Outpatient Prescriptions  Medication Sig Dispense Refill  . allopurinol (ZYLOPRIM) 100 MG tablet Take 100 mg by mouth daily.     . carvedilol (COREG) 25 MG tablet Take 0.5 tablets (12.5 mg total) by mouth 2 (two) times daily with a meal. 30 tablet 1  . diltiazem (TIAZAC) 300 MG 24 hr capsule Take 300 mg by mouth daily.    Marland Kitchen  doxazosin (CARDURA) 2 MG tablet Take 2 mg by mouth daily.    . fenofibrate 160 MG tablet Take 160 mg by mouth daily.    . fish oil-omega-3 fatty acids 1000 MG capsule Take 1 g by mouth 3 (three) times daily.     . pravastatin (PRAVACHOL) 10 MG tablet Take 10 mg by mouth every other day. Take every other night    . Tetrahydrozoline HCl (VISINE OP) Place 2 drops into both eyes daily as needed (dry eyes).    . warfarin (COUMADIN) 2.5 MG tablet Take 1 1/2 tablets daily except 1 tablet on Tuesdays, Thursdays and Saturdays 135 tablet 3   No current facility-administered medications for this visit.    Allergies:  Review of patient's allergies indicates no known allergies.   Social History: The patient  reports that he has quit smoking. His smoking use included Cigarettes. He has never used smokeless tobacco. He reports that he drinks alcohol. He reports that he does not use illicit drugs.   ROS:  Please see the history of present illness. Otherwise, complete review of systems is positive for none.  All other systems are reviewed and negative.   Physical Exam: VS:  BP 138/76 mmHg  Pulse 88  Ht 6\' 2"  (1.88 m)  Wt 222 lb (100.699 kg)  BMI 28.49  kg/m2  SpO2 97%, BMI Body mass index is 28.49 kg/(m^2).  Wt Readings from Last 3 Encounters:  05/29/14 222 lb (100.699 kg)  12/06/13 240 lb (108.863 kg)  10/19/13 263 lb 3.2 oz (119.387 kg)     Appears comfortable at rest.  HEENT: Conjunctiva and lids normal, oropharynx clear with moist mucosa.  Neck: Supple, no elevated JVP or carotid bruits, no thyromegaly.  Lungs: Clear to auscultation, nonlabored breathing at rest.  Cardiac: Irregularly irregular, no S3, soft systolic murmur, no pericardial rub.  Abdomen: Soft, nontender, bowel sounds present, no guarding or rebound.  Extremities: Left arm AV fistula, no pitting edema, distal pulses 1-2+.  Skin: Warm and dry.  Musculoskeletal: No kyphosis.  Neuropsychiatric: Alert and oriented x3,  affect grossly appropriate.   ECG: Tracing from August 2015 showed rate-controlled atrial fibrillation with PVC and nonspecific ST-T changes.  Recent Labwork: 10/12/2013: ALT 54*; AST 77*; Magnesium 2.5 10/15/2013: Hemoglobin 10.8*; Platelets 136* 10/20/2013: BUN 66*; Creatinine 5.29*; Potassium 4.3; Sodium 141   Other Studies Reviewed Today:  Echocardiogram from January 2013 demonstrated mild to moderate LVH with LVEF 55-60%, mildly to moderately dilated right ventricle with reduced function, PASP 50 mm mercury.  ASSESSMENT AND PLAN:  1. Chronic atrial fibrillation. Continue strategy of heart rate control and anticoagulation. Patient is asymptomatic.  2. History of cardiomyopathy with subsequent normalization of LVEF. Continue medical therapy and observation.  3. Essential hypertension, blood pressure in reasonable range today.  4. ESRD on hemodialysis, followed by Dr. Lowanda [name]. Patient seems to be feeling much better since starting dialysis, his weight is down further, he has more energy.  Current medicines were reviewed at length with the patient today.   Disposition: FU with me in 1 year.   Signed, [name], MD, [facility] 05/29/2014 1:08 PM    [facility] [address], [place], [place] [address] Phone: [phone]; Fax: [phone]

## 2014-05-29 NOTE — Patient Instructions (Signed)

## 2014-05-30 DIAGNOSIS — N186 End stage renal disease: Secondary | ICD-10-CM | POA: Diagnosis not present

## 2014-05-30 DIAGNOSIS — Z992 Dependence on renal dialysis: Secondary | ICD-10-CM | POA: Diagnosis not present

## 2014-05-30 DIAGNOSIS — D509 Iron deficiency anemia, unspecified: Secondary | ICD-10-CM | POA: Diagnosis not present

## 2014-05-30 DIAGNOSIS — N2581 Secondary hyperparathyroidism of renal origin: Secondary | ICD-10-CM | POA: Diagnosis not present

## 2014-06-01 DIAGNOSIS — N2581 Secondary hyperparathyroidism of renal origin: Secondary | ICD-10-CM | POA: Diagnosis not present

## 2014-06-01 DIAGNOSIS — D509 Iron deficiency anemia, unspecified: Secondary | ICD-10-CM | POA: Diagnosis not present

## 2014-06-01 DIAGNOSIS — N186 End stage renal disease: Secondary | ICD-10-CM | POA: Diagnosis not present

## 2014-06-01 DIAGNOSIS — Z992 Dependence on renal dialysis: Secondary | ICD-10-CM | POA: Diagnosis not present

## 2014-06-04 DIAGNOSIS — N2581 Secondary hyperparathyroidism of renal origin: Secondary | ICD-10-CM | POA: Diagnosis not present

## 2014-06-04 DIAGNOSIS — N186 End stage renal disease: Secondary | ICD-10-CM | POA: Diagnosis not present

## 2014-06-04 DIAGNOSIS — Z992 Dependence on renal dialysis: Secondary | ICD-10-CM | POA: Diagnosis not present

## 2014-06-04 DIAGNOSIS — D509 Iron deficiency anemia, unspecified: Secondary | ICD-10-CM | POA: Diagnosis not present

## 2014-06-06 DIAGNOSIS — N2581 Secondary hyperparathyroidism of renal origin: Secondary | ICD-10-CM | POA: Diagnosis not present

## 2014-06-06 DIAGNOSIS — D509 Iron deficiency anemia, unspecified: Secondary | ICD-10-CM | POA: Diagnosis not present

## 2014-06-06 DIAGNOSIS — Z992 Dependence on renal dialysis: Secondary | ICD-10-CM | POA: Diagnosis not present

## 2014-06-06 DIAGNOSIS — N186 End stage renal disease: Secondary | ICD-10-CM | POA: Diagnosis not present

## 2014-06-08 DIAGNOSIS — N2581 Secondary hyperparathyroidism of renal origin: Secondary | ICD-10-CM | POA: Diagnosis not present

## 2014-06-08 DIAGNOSIS — N186 End stage renal disease: Secondary | ICD-10-CM | POA: Diagnosis not present

## 2014-06-08 DIAGNOSIS — Z992 Dependence on renal dialysis: Secondary | ICD-10-CM | POA: Diagnosis not present

## 2014-06-08 DIAGNOSIS — D509 Iron deficiency anemia, unspecified: Secondary | ICD-10-CM | POA: Diagnosis not present

## 2014-06-10 ENCOUNTER — Ambulatory Visit (INDEPENDENT_AMBULATORY_CARE_PROVIDER_SITE_OTHER): Payer: Commercial Managed Care - HMO | Admitting: *Deleted

## 2014-06-10 DIAGNOSIS — Z7901 Long term (current) use of anticoagulants: Secondary | ICD-10-CM | POA: Diagnosis not present

## 2014-06-10 DIAGNOSIS — Z5181 Encounter for therapeutic drug level monitoring: Secondary | ICD-10-CM

## 2014-06-10 DIAGNOSIS — I480 Paroxysmal atrial fibrillation: Secondary | ICD-10-CM | POA: Diagnosis not present

## 2014-06-10 DIAGNOSIS — I4891 Unspecified atrial fibrillation: Secondary | ICD-10-CM | POA: Diagnosis not present

## 2014-06-10 LAB — POCT INR: INR: 2.1

## 2014-06-11 DIAGNOSIS — Z992 Dependence on renal dialysis: Secondary | ICD-10-CM | POA: Diagnosis not present

## 2014-06-11 DIAGNOSIS — N186 End stage renal disease: Secondary | ICD-10-CM | POA: Diagnosis not present

## 2014-06-11 DIAGNOSIS — N2581 Secondary hyperparathyroidism of renal origin: Secondary | ICD-10-CM | POA: Diagnosis not present

## 2014-06-11 DIAGNOSIS — D509 Iron deficiency anemia, unspecified: Secondary | ICD-10-CM | POA: Diagnosis not present

## 2014-06-13 DIAGNOSIS — N2581 Secondary hyperparathyroidism of renal origin: Secondary | ICD-10-CM | POA: Diagnosis not present

## 2014-06-13 DIAGNOSIS — N186 End stage renal disease: Secondary | ICD-10-CM | POA: Diagnosis not present

## 2014-06-13 DIAGNOSIS — Z992 Dependence on renal dialysis: Secondary | ICD-10-CM | POA: Diagnosis not present

## 2014-06-13 DIAGNOSIS — D509 Iron deficiency anemia, unspecified: Secondary | ICD-10-CM | POA: Diagnosis not present

## 2014-06-15 DIAGNOSIS — D509 Iron deficiency anemia, unspecified: Secondary | ICD-10-CM | POA: Diagnosis not present

## 2014-06-15 DIAGNOSIS — Z992 Dependence on renal dialysis: Secondary | ICD-10-CM | POA: Diagnosis not present

## 2014-06-15 DIAGNOSIS — N2581 Secondary hyperparathyroidism of renal origin: Secondary | ICD-10-CM | POA: Diagnosis not present

## 2014-06-15 DIAGNOSIS — N186 End stage renal disease: Secondary | ICD-10-CM | POA: Diagnosis not present

## 2014-06-18 DIAGNOSIS — N2581 Secondary hyperparathyroidism of renal origin: Secondary | ICD-10-CM | POA: Diagnosis not present

## 2014-06-18 DIAGNOSIS — D509 Iron deficiency anemia, unspecified: Secondary | ICD-10-CM | POA: Diagnosis not present

## 2014-06-18 DIAGNOSIS — Z992 Dependence on renal dialysis: Secondary | ICD-10-CM | POA: Diagnosis not present

## 2014-06-18 DIAGNOSIS — N186 End stage renal disease: Secondary | ICD-10-CM | POA: Diagnosis not present

## 2014-06-20 DIAGNOSIS — Z992 Dependence on renal dialysis: Secondary | ICD-10-CM | POA: Diagnosis not present

## 2014-06-20 DIAGNOSIS — N186 End stage renal disease: Secondary | ICD-10-CM | POA: Diagnosis not present

## 2014-06-20 DIAGNOSIS — D509 Iron deficiency anemia, unspecified: Secondary | ICD-10-CM | POA: Diagnosis not present

## 2014-06-20 DIAGNOSIS — N2581 Secondary hyperparathyroidism of renal origin: Secondary | ICD-10-CM | POA: Diagnosis not present

## 2014-06-21 DIAGNOSIS — E1165 Type 2 diabetes mellitus with hyperglycemia: Secondary | ICD-10-CM | POA: Diagnosis not present

## 2014-06-21 DIAGNOSIS — E663 Overweight: Secondary | ICD-10-CM | POA: Diagnosis not present

## 2014-06-21 DIAGNOSIS — N186 End stage renal disease: Secondary | ICD-10-CM | POA: Diagnosis not present

## 2014-06-21 DIAGNOSIS — Z6827 Body mass index (BMI) 27.0-27.9, adult: Secondary | ICD-10-CM | POA: Diagnosis not present

## 2014-06-22 DIAGNOSIS — N186 End stage renal disease: Secondary | ICD-10-CM | POA: Diagnosis not present

## 2014-06-22 DIAGNOSIS — D509 Iron deficiency anemia, unspecified: Secondary | ICD-10-CM | POA: Diagnosis not present

## 2014-06-22 DIAGNOSIS — Z992 Dependence on renal dialysis: Secondary | ICD-10-CM | POA: Diagnosis not present

## 2014-06-22 DIAGNOSIS — N2581 Secondary hyperparathyroidism of renal origin: Secondary | ICD-10-CM | POA: Diagnosis not present

## 2014-06-25 DIAGNOSIS — D509 Iron deficiency anemia, unspecified: Secondary | ICD-10-CM | POA: Diagnosis not present

## 2014-06-25 DIAGNOSIS — N186 End stage renal disease: Secondary | ICD-10-CM | POA: Diagnosis not present

## 2014-06-25 DIAGNOSIS — N2581 Secondary hyperparathyroidism of renal origin: Secondary | ICD-10-CM | POA: Diagnosis not present

## 2014-06-25 DIAGNOSIS — Z992 Dependence on renal dialysis: Secondary | ICD-10-CM | POA: Diagnosis not present

## 2014-06-27 DIAGNOSIS — D509 Iron deficiency anemia, unspecified: Secondary | ICD-10-CM | POA: Diagnosis not present

## 2014-06-27 DIAGNOSIS — Z992 Dependence on renal dialysis: Secondary | ICD-10-CM | POA: Diagnosis not present

## 2014-06-27 DIAGNOSIS — N186 End stage renal disease: Secondary | ICD-10-CM | POA: Diagnosis not present

## 2014-06-27 DIAGNOSIS — N2581 Secondary hyperparathyroidism of renal origin: Secondary | ICD-10-CM | POA: Diagnosis not present

## 2014-06-29 DIAGNOSIS — D509 Iron deficiency anemia, unspecified: Secondary | ICD-10-CM | POA: Diagnosis not present

## 2014-06-29 DIAGNOSIS — Z992 Dependence on renal dialysis: Secondary | ICD-10-CM | POA: Diagnosis not present

## 2014-06-29 DIAGNOSIS — N2581 Secondary hyperparathyroidism of renal origin: Secondary | ICD-10-CM | POA: Diagnosis not present

## 2014-06-29 DIAGNOSIS — N186 End stage renal disease: Secondary | ICD-10-CM | POA: Diagnosis not present

## 2014-07-02 DIAGNOSIS — N2581 Secondary hyperparathyroidism of renal origin: Secondary | ICD-10-CM | POA: Diagnosis not present

## 2014-07-02 DIAGNOSIS — Z992 Dependence on renal dialysis: Secondary | ICD-10-CM | POA: Diagnosis not present

## 2014-07-02 DIAGNOSIS — N186 End stage renal disease: Secondary | ICD-10-CM | POA: Diagnosis not present

## 2014-07-02 DIAGNOSIS — D509 Iron deficiency anemia, unspecified: Secondary | ICD-10-CM | POA: Diagnosis not present

## 2014-07-03 ENCOUNTER — Ambulatory Visit (INDEPENDENT_AMBULATORY_CARE_PROVIDER_SITE_OTHER): Payer: Commercial Managed Care - HMO | Admitting: *Deleted

## 2014-07-03 DIAGNOSIS — Z7901 Long term (current) use of anticoagulants: Secondary | ICD-10-CM | POA: Diagnosis not present

## 2014-07-03 DIAGNOSIS — I4891 Unspecified atrial fibrillation: Secondary | ICD-10-CM

## 2014-07-03 DIAGNOSIS — Z5181 Encounter for therapeutic drug level monitoring: Secondary | ICD-10-CM | POA: Diagnosis not present

## 2014-07-03 LAB — POCT INR: INR: 3.1

## 2014-07-04 DIAGNOSIS — N2581 Secondary hyperparathyroidism of renal origin: Secondary | ICD-10-CM | POA: Diagnosis not present

## 2014-07-04 DIAGNOSIS — Z992 Dependence on renal dialysis: Secondary | ICD-10-CM | POA: Diagnosis not present

## 2014-07-04 DIAGNOSIS — N186 End stage renal disease: Secondary | ICD-10-CM | POA: Diagnosis not present

## 2014-07-04 DIAGNOSIS — D509 Iron deficiency anemia, unspecified: Secondary | ICD-10-CM | POA: Diagnosis not present

## 2014-07-06 DIAGNOSIS — D509 Iron deficiency anemia, unspecified: Secondary | ICD-10-CM | POA: Diagnosis not present

## 2014-07-06 DIAGNOSIS — N2581 Secondary hyperparathyroidism of renal origin: Secondary | ICD-10-CM | POA: Diagnosis not present

## 2014-07-06 DIAGNOSIS — N186 End stage renal disease: Secondary | ICD-10-CM | POA: Diagnosis not present

## 2014-07-06 DIAGNOSIS — Z992 Dependence on renal dialysis: Secondary | ICD-10-CM | POA: Diagnosis not present

## 2014-07-09 DIAGNOSIS — N2581 Secondary hyperparathyroidism of renal origin: Secondary | ICD-10-CM | POA: Diagnosis not present

## 2014-07-09 DIAGNOSIS — Z992 Dependence on renal dialysis: Secondary | ICD-10-CM | POA: Diagnosis not present

## 2014-07-09 DIAGNOSIS — D509 Iron deficiency anemia, unspecified: Secondary | ICD-10-CM | POA: Diagnosis not present

## 2014-07-09 DIAGNOSIS — N186 End stage renal disease: Secondary | ICD-10-CM | POA: Diagnosis not present

## 2014-07-11 DIAGNOSIS — N2581 Secondary hyperparathyroidism of renal origin: Secondary | ICD-10-CM | POA: Diagnosis not present

## 2014-07-11 DIAGNOSIS — N186 End stage renal disease: Secondary | ICD-10-CM | POA: Diagnosis not present

## 2014-07-11 DIAGNOSIS — Z992 Dependence on renal dialysis: Secondary | ICD-10-CM | POA: Diagnosis not present

## 2014-07-11 DIAGNOSIS — D509 Iron deficiency anemia, unspecified: Secondary | ICD-10-CM | POA: Diagnosis not present

## 2014-07-13 DIAGNOSIS — D509 Iron deficiency anemia, unspecified: Secondary | ICD-10-CM | POA: Diagnosis not present

## 2014-07-13 DIAGNOSIS — Z992 Dependence on renal dialysis: Secondary | ICD-10-CM | POA: Diagnosis not present

## 2014-07-13 DIAGNOSIS — N2581 Secondary hyperparathyroidism of renal origin: Secondary | ICD-10-CM | POA: Diagnosis not present

## 2014-07-13 DIAGNOSIS — N186 End stage renal disease: Secondary | ICD-10-CM | POA: Diagnosis not present

## 2014-07-16 DIAGNOSIS — N2581 Secondary hyperparathyroidism of renal origin: Secondary | ICD-10-CM | POA: Diagnosis not present

## 2014-07-16 DIAGNOSIS — D509 Iron deficiency anemia, unspecified: Secondary | ICD-10-CM | POA: Diagnosis not present

## 2014-07-16 DIAGNOSIS — Z992 Dependence on renal dialysis: Secondary | ICD-10-CM | POA: Diagnosis not present

## 2014-07-16 DIAGNOSIS — N186 End stage renal disease: Secondary | ICD-10-CM | POA: Diagnosis not present

## 2014-07-18 DIAGNOSIS — N186 End stage renal disease: Secondary | ICD-10-CM | POA: Diagnosis not present

## 2014-07-18 DIAGNOSIS — N2581 Secondary hyperparathyroidism of renal origin: Secondary | ICD-10-CM | POA: Diagnosis not present

## 2014-07-18 DIAGNOSIS — D509 Iron deficiency anemia, unspecified: Secondary | ICD-10-CM | POA: Diagnosis not present

## 2014-07-18 DIAGNOSIS — Z992 Dependence on renal dialysis: Secondary | ICD-10-CM | POA: Diagnosis not present

## 2014-07-20 DIAGNOSIS — N186 End stage renal disease: Secondary | ICD-10-CM | POA: Diagnosis not present

## 2014-07-20 DIAGNOSIS — D509 Iron deficiency anemia, unspecified: Secondary | ICD-10-CM | POA: Diagnosis not present

## 2014-07-20 DIAGNOSIS — N2581 Secondary hyperparathyroidism of renal origin: Secondary | ICD-10-CM | POA: Diagnosis not present

## 2014-07-20 DIAGNOSIS — Z992 Dependence on renal dialysis: Secondary | ICD-10-CM | POA: Diagnosis not present

## 2014-07-23 DIAGNOSIS — Z992 Dependence on renal dialysis: Secondary | ICD-10-CM | POA: Diagnosis not present

## 2014-07-23 DIAGNOSIS — N2581 Secondary hyperparathyroidism of renal origin: Secondary | ICD-10-CM | POA: Diagnosis not present

## 2014-07-23 DIAGNOSIS — N186 End stage renal disease: Secondary | ICD-10-CM | POA: Diagnosis not present

## 2014-07-23 DIAGNOSIS — D509 Iron deficiency anemia, unspecified: Secondary | ICD-10-CM | POA: Diagnosis not present

## 2014-07-25 DIAGNOSIS — N2581 Secondary hyperparathyroidism of renal origin: Secondary | ICD-10-CM | POA: Diagnosis not present

## 2014-07-25 DIAGNOSIS — N186 End stage renal disease: Secondary | ICD-10-CM | POA: Diagnosis not present

## 2014-07-25 DIAGNOSIS — Z992 Dependence on renal dialysis: Secondary | ICD-10-CM | POA: Diagnosis not present

## 2014-07-25 DIAGNOSIS — D509 Iron deficiency anemia, unspecified: Secondary | ICD-10-CM | POA: Diagnosis not present

## 2014-07-27 DIAGNOSIS — N186 End stage renal disease: Secondary | ICD-10-CM | POA: Diagnosis not present

## 2014-07-27 DIAGNOSIS — Z992 Dependence on renal dialysis: Secondary | ICD-10-CM | POA: Diagnosis not present

## 2014-07-27 DIAGNOSIS — D509 Iron deficiency anemia, unspecified: Secondary | ICD-10-CM | POA: Diagnosis not present

## 2014-07-27 DIAGNOSIS — N2581 Secondary hyperparathyroidism of renal origin: Secondary | ICD-10-CM | POA: Diagnosis not present

## 2014-07-30 DIAGNOSIS — D509 Iron deficiency anemia, unspecified: Secondary | ICD-10-CM | POA: Diagnosis not present

## 2014-07-30 DIAGNOSIS — N2581 Secondary hyperparathyroidism of renal origin: Secondary | ICD-10-CM | POA: Diagnosis not present

## 2014-07-30 DIAGNOSIS — N186 End stage renal disease: Secondary | ICD-10-CM | POA: Diagnosis not present

## 2014-07-30 DIAGNOSIS — Z992 Dependence on renal dialysis: Secondary | ICD-10-CM | POA: Diagnosis not present

## 2014-07-31 ENCOUNTER — Ambulatory Visit (INDEPENDENT_AMBULATORY_CARE_PROVIDER_SITE_OTHER): Payer: Commercial Managed Care - HMO | Admitting: *Deleted

## 2014-07-31 DIAGNOSIS — Z5181 Encounter for therapeutic drug level monitoring: Secondary | ICD-10-CM

## 2014-07-31 DIAGNOSIS — Z7901 Long term (current) use of anticoagulants: Secondary | ICD-10-CM | POA: Diagnosis not present

## 2014-07-31 DIAGNOSIS — I4891 Unspecified atrial fibrillation: Secondary | ICD-10-CM

## 2014-07-31 LAB — POCT INR: INR: 1.8

## 2014-08-01 DIAGNOSIS — N2581 Secondary hyperparathyroidism of renal origin: Secondary | ICD-10-CM | POA: Diagnosis not present

## 2014-08-01 DIAGNOSIS — N186 End stage renal disease: Secondary | ICD-10-CM | POA: Diagnosis not present

## 2014-08-01 DIAGNOSIS — Z992 Dependence on renal dialysis: Secondary | ICD-10-CM | POA: Diagnosis not present

## 2014-08-01 DIAGNOSIS — D509 Iron deficiency anemia, unspecified: Secondary | ICD-10-CM | POA: Diagnosis not present

## 2014-08-03 DIAGNOSIS — Z992 Dependence on renal dialysis: Secondary | ICD-10-CM | POA: Diagnosis not present

## 2014-08-03 DIAGNOSIS — N2581 Secondary hyperparathyroidism of renal origin: Secondary | ICD-10-CM | POA: Diagnosis not present

## 2014-08-03 DIAGNOSIS — N186 End stage renal disease: Secondary | ICD-10-CM | POA: Diagnosis not present

## 2014-08-03 DIAGNOSIS — D509 Iron deficiency anemia, unspecified: Secondary | ICD-10-CM | POA: Diagnosis not present

## 2014-08-06 ENCOUNTER — Other Ambulatory Visit: Payer: Self-pay | Admitting: Cardiology

## 2014-08-06 DIAGNOSIS — Z992 Dependence on renal dialysis: Secondary | ICD-10-CM | POA: Diagnosis not present

## 2014-08-06 DIAGNOSIS — D509 Iron deficiency anemia, unspecified: Secondary | ICD-10-CM | POA: Diagnosis not present

## 2014-08-06 DIAGNOSIS — N186 End stage renal disease: Secondary | ICD-10-CM | POA: Diagnosis not present

## 2014-08-06 DIAGNOSIS — N2581 Secondary hyperparathyroidism of renal origin: Secondary | ICD-10-CM | POA: Diagnosis not present

## 2014-08-08 DIAGNOSIS — Z992 Dependence on renal dialysis: Secondary | ICD-10-CM | POA: Diagnosis not present

## 2014-08-08 DIAGNOSIS — N186 End stage renal disease: Secondary | ICD-10-CM | POA: Diagnosis not present

## 2014-08-08 DIAGNOSIS — D509 Iron deficiency anemia, unspecified: Secondary | ICD-10-CM | POA: Diagnosis not present

## 2014-08-08 DIAGNOSIS — N2581 Secondary hyperparathyroidism of renal origin: Secondary | ICD-10-CM | POA: Diagnosis not present

## 2014-08-10 DIAGNOSIS — N2581 Secondary hyperparathyroidism of renal origin: Secondary | ICD-10-CM | POA: Diagnosis not present

## 2014-08-10 DIAGNOSIS — Z992 Dependence on renal dialysis: Secondary | ICD-10-CM | POA: Diagnosis not present

## 2014-08-10 DIAGNOSIS — N186 End stage renal disease: Secondary | ICD-10-CM | POA: Diagnosis not present

## 2014-08-10 DIAGNOSIS — D509 Iron deficiency anemia, unspecified: Secondary | ICD-10-CM | POA: Diagnosis not present

## 2014-08-12 DIAGNOSIS — I10 Essential (primary) hypertension: Secondary | ICD-10-CM | POA: Diagnosis not present

## 2014-08-12 DIAGNOSIS — N528 Other male erectile dysfunction: Secondary | ICD-10-CM | POA: Diagnosis not present

## 2014-08-12 DIAGNOSIS — Z6828 Body mass index (BMI) 28.0-28.9, adult: Secondary | ICD-10-CM | POA: Diagnosis not present

## 2014-08-12 DIAGNOSIS — E1129 Type 2 diabetes mellitus with other diabetic kidney complication: Secondary | ICD-10-CM | POA: Diagnosis not present

## 2014-08-12 DIAGNOSIS — E663 Overweight: Secondary | ICD-10-CM | POA: Diagnosis not present

## 2014-08-12 DIAGNOSIS — H0263 Xanthelasma of right eye, unspecified eyelid: Secondary | ICD-10-CM | POA: Diagnosis not present

## 2014-08-12 DIAGNOSIS — Z0000 Encounter for general adult medical examination without abnormal findings: Secondary | ICD-10-CM | POA: Diagnosis not present

## 2014-08-13 DIAGNOSIS — N2581 Secondary hyperparathyroidism of renal origin: Secondary | ICD-10-CM | POA: Diagnosis not present

## 2014-08-13 DIAGNOSIS — D509 Iron deficiency anemia, unspecified: Secondary | ICD-10-CM | POA: Diagnosis not present

## 2014-08-13 DIAGNOSIS — Z992 Dependence on renal dialysis: Secondary | ICD-10-CM | POA: Diagnosis not present

## 2014-08-13 DIAGNOSIS — N186 End stage renal disease: Secondary | ICD-10-CM | POA: Diagnosis not present

## 2014-08-15 DIAGNOSIS — Z992 Dependence on renal dialysis: Secondary | ICD-10-CM | POA: Diagnosis not present

## 2014-08-15 DIAGNOSIS — N2581 Secondary hyperparathyroidism of renal origin: Secondary | ICD-10-CM | POA: Diagnosis not present

## 2014-08-15 DIAGNOSIS — D509 Iron deficiency anemia, unspecified: Secondary | ICD-10-CM | POA: Diagnosis not present

## 2014-08-15 DIAGNOSIS — N186 End stage renal disease: Secondary | ICD-10-CM | POA: Diagnosis not present

## 2014-08-17 DIAGNOSIS — N2581 Secondary hyperparathyroidism of renal origin: Secondary | ICD-10-CM | POA: Diagnosis not present

## 2014-08-17 DIAGNOSIS — N186 End stage renal disease: Secondary | ICD-10-CM | POA: Diagnosis not present

## 2014-08-17 DIAGNOSIS — D509 Iron deficiency anemia, unspecified: Secondary | ICD-10-CM | POA: Diagnosis not present

## 2014-08-17 DIAGNOSIS — Z992 Dependence on renal dialysis: Secondary | ICD-10-CM | POA: Diagnosis not present

## 2014-08-20 DIAGNOSIS — D509 Iron deficiency anemia, unspecified: Secondary | ICD-10-CM | POA: Diagnosis not present

## 2014-08-20 DIAGNOSIS — N2581 Secondary hyperparathyroidism of renal origin: Secondary | ICD-10-CM | POA: Diagnosis not present

## 2014-08-20 DIAGNOSIS — Z992 Dependence on renal dialysis: Secondary | ICD-10-CM | POA: Diagnosis not present

## 2014-08-20 DIAGNOSIS — N186 End stage renal disease: Secondary | ICD-10-CM | POA: Diagnosis not present

## 2014-08-21 ENCOUNTER — Ambulatory Visit (INDEPENDENT_AMBULATORY_CARE_PROVIDER_SITE_OTHER): Payer: Commercial Managed Care - HMO | Admitting: *Deleted

## 2014-08-21 DIAGNOSIS — I4891 Unspecified atrial fibrillation: Secondary | ICD-10-CM | POA: Diagnosis not present

## 2014-08-21 DIAGNOSIS — Z7901 Long term (current) use of anticoagulants: Secondary | ICD-10-CM

## 2014-08-21 DIAGNOSIS — Z5181 Encounter for therapeutic drug level monitoring: Secondary | ICD-10-CM | POA: Diagnosis not present

## 2014-08-21 LAB — POCT INR: INR: 1.9

## 2014-08-22 DIAGNOSIS — N186 End stage renal disease: Secondary | ICD-10-CM | POA: Diagnosis not present

## 2014-08-22 DIAGNOSIS — N2581 Secondary hyperparathyroidism of renal origin: Secondary | ICD-10-CM | POA: Diagnosis not present

## 2014-08-22 DIAGNOSIS — D509 Iron deficiency anemia, unspecified: Secondary | ICD-10-CM | POA: Diagnosis not present

## 2014-08-22 DIAGNOSIS — Z992 Dependence on renal dialysis: Secondary | ICD-10-CM | POA: Diagnosis not present

## 2014-08-24 DIAGNOSIS — N2581 Secondary hyperparathyroidism of renal origin: Secondary | ICD-10-CM | POA: Diagnosis not present

## 2014-08-24 DIAGNOSIS — Z992 Dependence on renal dialysis: Secondary | ICD-10-CM | POA: Diagnosis not present

## 2014-08-24 DIAGNOSIS — D509 Iron deficiency anemia, unspecified: Secondary | ICD-10-CM | POA: Diagnosis not present

## 2014-08-24 DIAGNOSIS — N186 End stage renal disease: Secondary | ICD-10-CM | POA: Diagnosis not present

## 2014-08-24 DIAGNOSIS — Z23 Encounter for immunization: Secondary | ICD-10-CM | POA: Diagnosis not present

## 2014-08-27 DIAGNOSIS — Z992 Dependence on renal dialysis: Secondary | ICD-10-CM | POA: Diagnosis not present

## 2014-08-27 DIAGNOSIS — Z23 Encounter for immunization: Secondary | ICD-10-CM | POA: Diagnosis not present

## 2014-08-27 DIAGNOSIS — N2581 Secondary hyperparathyroidism of renal origin: Secondary | ICD-10-CM | POA: Diagnosis not present

## 2014-08-27 DIAGNOSIS — D509 Iron deficiency anemia, unspecified: Secondary | ICD-10-CM | POA: Diagnosis not present

## 2014-08-27 DIAGNOSIS — N186 End stage renal disease: Secondary | ICD-10-CM | POA: Diagnosis not present

## 2014-08-29 DIAGNOSIS — N186 End stage renal disease: Secondary | ICD-10-CM | POA: Diagnosis not present

## 2014-08-29 DIAGNOSIS — D509 Iron deficiency anemia, unspecified: Secondary | ICD-10-CM | POA: Diagnosis not present

## 2014-08-29 DIAGNOSIS — N2581 Secondary hyperparathyroidism of renal origin: Secondary | ICD-10-CM | POA: Diagnosis not present

## 2014-08-29 DIAGNOSIS — Z23 Encounter for immunization: Secondary | ICD-10-CM | POA: Diagnosis not present

## 2014-08-29 DIAGNOSIS — Z992 Dependence on renal dialysis: Secondary | ICD-10-CM | POA: Diagnosis not present

## 2014-08-31 DIAGNOSIS — Z23 Encounter for immunization: Secondary | ICD-10-CM | POA: Diagnosis not present

## 2014-08-31 DIAGNOSIS — N2581 Secondary hyperparathyroidism of renal origin: Secondary | ICD-10-CM | POA: Diagnosis not present

## 2014-08-31 DIAGNOSIS — Z992 Dependence on renal dialysis: Secondary | ICD-10-CM | POA: Diagnosis not present

## 2014-08-31 DIAGNOSIS — D509 Iron deficiency anemia, unspecified: Secondary | ICD-10-CM | POA: Diagnosis not present

## 2014-08-31 DIAGNOSIS — N186 End stage renal disease: Secondary | ICD-10-CM | POA: Diagnosis not present

## 2014-09-03 DIAGNOSIS — D509 Iron deficiency anemia, unspecified: Secondary | ICD-10-CM | POA: Diagnosis not present

## 2014-09-03 DIAGNOSIS — N2581 Secondary hyperparathyroidism of renal origin: Secondary | ICD-10-CM | POA: Diagnosis not present

## 2014-09-03 DIAGNOSIS — N186 End stage renal disease: Secondary | ICD-10-CM | POA: Diagnosis not present

## 2014-09-03 DIAGNOSIS — Z23 Encounter for immunization: Secondary | ICD-10-CM | POA: Diagnosis not present

## 2014-09-03 DIAGNOSIS — Z992 Dependence on renal dialysis: Secondary | ICD-10-CM | POA: Diagnosis not present

## 2014-09-04 DIAGNOSIS — D509 Iron deficiency anemia, unspecified: Secondary | ICD-10-CM | POA: Diagnosis not present

## 2014-09-04 DIAGNOSIS — Z23 Encounter for immunization: Secondary | ICD-10-CM | POA: Diagnosis not present

## 2014-09-04 DIAGNOSIS — N186 End stage renal disease: Secondary | ICD-10-CM | POA: Diagnosis not present

## 2014-09-04 DIAGNOSIS — Z992 Dependence on renal dialysis: Secondary | ICD-10-CM | POA: Diagnosis not present

## 2014-09-04 DIAGNOSIS — N2581 Secondary hyperparathyroidism of renal origin: Secondary | ICD-10-CM | POA: Diagnosis not present

## 2014-09-05 DIAGNOSIS — N2889 Other specified disorders of kidney and ureter: Secondary | ICD-10-CM | POA: Diagnosis not present

## 2014-09-07 DIAGNOSIS — Z23 Encounter for immunization: Secondary | ICD-10-CM | POA: Diagnosis not present

## 2014-09-07 DIAGNOSIS — N186 End stage renal disease: Secondary | ICD-10-CM | POA: Diagnosis not present

## 2014-09-07 DIAGNOSIS — N2581 Secondary hyperparathyroidism of renal origin: Secondary | ICD-10-CM | POA: Diagnosis not present

## 2014-09-07 DIAGNOSIS — D509 Iron deficiency anemia, unspecified: Secondary | ICD-10-CM | POA: Diagnosis not present

## 2014-09-07 DIAGNOSIS — Z992 Dependence on renal dialysis: Secondary | ICD-10-CM | POA: Diagnosis not present

## 2014-09-09 ENCOUNTER — Ambulatory Visit (INDEPENDENT_AMBULATORY_CARE_PROVIDER_SITE_OTHER): Payer: Commercial Managed Care - HMO | Admitting: *Deleted

## 2014-09-09 DIAGNOSIS — I4891 Unspecified atrial fibrillation: Secondary | ICD-10-CM

## 2014-09-09 DIAGNOSIS — Z5181 Encounter for therapeutic drug level monitoring: Secondary | ICD-10-CM

## 2014-09-09 DIAGNOSIS — Z7901 Long term (current) use of anticoagulants: Secondary | ICD-10-CM

## 2014-09-09 LAB — POCT INR: INR: 2.5

## 2014-09-10 DIAGNOSIS — D509 Iron deficiency anemia, unspecified: Secondary | ICD-10-CM | POA: Diagnosis not present

## 2014-09-10 DIAGNOSIS — N186 End stage renal disease: Secondary | ICD-10-CM | POA: Diagnosis not present

## 2014-09-10 DIAGNOSIS — N2581 Secondary hyperparathyroidism of renal origin: Secondary | ICD-10-CM | POA: Diagnosis not present

## 2014-09-10 DIAGNOSIS — Z992 Dependence on renal dialysis: Secondary | ICD-10-CM | POA: Diagnosis not present

## 2014-09-10 DIAGNOSIS — Z23 Encounter for immunization: Secondary | ICD-10-CM | POA: Diagnosis not present

## 2014-09-12 DIAGNOSIS — N2581 Secondary hyperparathyroidism of renal origin: Secondary | ICD-10-CM | POA: Diagnosis not present

## 2014-09-12 DIAGNOSIS — Z23 Encounter for immunization: Secondary | ICD-10-CM | POA: Diagnosis not present

## 2014-09-12 DIAGNOSIS — N186 End stage renal disease: Secondary | ICD-10-CM | POA: Diagnosis not present

## 2014-09-12 DIAGNOSIS — D509 Iron deficiency anemia, unspecified: Secondary | ICD-10-CM | POA: Diagnosis not present

## 2014-09-12 DIAGNOSIS — Z992 Dependence on renal dialysis: Secondary | ICD-10-CM | POA: Diagnosis not present

## 2014-09-14 DIAGNOSIS — D509 Iron deficiency anemia, unspecified: Secondary | ICD-10-CM | POA: Diagnosis not present

## 2014-09-14 DIAGNOSIS — N2581 Secondary hyperparathyroidism of renal origin: Secondary | ICD-10-CM | POA: Diagnosis not present

## 2014-09-14 DIAGNOSIS — Z992 Dependence on renal dialysis: Secondary | ICD-10-CM | POA: Diagnosis not present

## 2014-09-14 DIAGNOSIS — N186 End stage renal disease: Secondary | ICD-10-CM | POA: Diagnosis not present

## 2014-09-14 DIAGNOSIS — Z23 Encounter for immunization: Secondary | ICD-10-CM | POA: Diagnosis not present

## 2014-09-17 DIAGNOSIS — Z992 Dependence on renal dialysis: Secondary | ICD-10-CM | POA: Diagnosis not present

## 2014-09-17 DIAGNOSIS — N2581 Secondary hyperparathyroidism of renal origin: Secondary | ICD-10-CM | POA: Diagnosis not present

## 2014-09-17 DIAGNOSIS — Z23 Encounter for immunization: Secondary | ICD-10-CM | POA: Diagnosis not present

## 2014-09-17 DIAGNOSIS — N186 End stage renal disease: Secondary | ICD-10-CM | POA: Diagnosis not present

## 2014-09-17 DIAGNOSIS — D509 Iron deficiency anemia, unspecified: Secondary | ICD-10-CM | POA: Diagnosis not present

## 2014-09-18 DIAGNOSIS — I517 Cardiomegaly: Secondary | ICD-10-CM | POA: Diagnosis not present

## 2014-09-18 DIAGNOSIS — K4090 Unilateral inguinal hernia, without obstruction or gangrene, not specified as recurrent: Secondary | ICD-10-CM | POA: Diagnosis not present

## 2014-09-18 DIAGNOSIS — N2889 Other specified disorders of kidney and ureter: Secondary | ICD-10-CM | POA: Diagnosis not present

## 2014-09-18 DIAGNOSIS — Z85528 Personal history of other malignant neoplasm of kidney: Secondary | ICD-10-CM | POA: Diagnosis not present

## 2014-09-18 DIAGNOSIS — N281 Cyst of kidney, acquired: Secondary | ICD-10-CM | POA: Diagnosis not present

## 2014-09-18 DIAGNOSIS — J986 Disorders of diaphragm: Secondary | ICD-10-CM | POA: Diagnosis not present

## 2014-09-19 DIAGNOSIS — D509 Iron deficiency anemia, unspecified: Secondary | ICD-10-CM | POA: Diagnosis not present

## 2014-09-19 DIAGNOSIS — Z992 Dependence on renal dialysis: Secondary | ICD-10-CM | POA: Diagnosis not present

## 2014-09-19 DIAGNOSIS — N186 End stage renal disease: Secondary | ICD-10-CM | POA: Diagnosis not present

## 2014-09-19 DIAGNOSIS — N2581 Secondary hyperparathyroidism of renal origin: Secondary | ICD-10-CM | POA: Diagnosis not present

## 2014-09-19 DIAGNOSIS — Z23 Encounter for immunization: Secondary | ICD-10-CM | POA: Diagnosis not present

## 2014-09-21 DIAGNOSIS — N2581 Secondary hyperparathyroidism of renal origin: Secondary | ICD-10-CM | POA: Diagnosis not present

## 2014-09-21 DIAGNOSIS — Z23 Encounter for immunization: Secondary | ICD-10-CM | POA: Diagnosis not present

## 2014-09-21 DIAGNOSIS — N186 End stage renal disease: Secondary | ICD-10-CM | POA: Diagnosis not present

## 2014-09-21 DIAGNOSIS — D509 Iron deficiency anemia, unspecified: Secondary | ICD-10-CM | POA: Diagnosis not present

## 2014-09-21 DIAGNOSIS — Z992 Dependence on renal dialysis: Secondary | ICD-10-CM | POA: Diagnosis not present

## 2014-09-22 DIAGNOSIS — N186 End stage renal disease: Secondary | ICD-10-CM | POA: Diagnosis not present

## 2014-09-22 DIAGNOSIS — Z992 Dependence on renal dialysis: Secondary | ICD-10-CM | POA: Diagnosis not present

## 2014-09-24 DIAGNOSIS — D509 Iron deficiency anemia, unspecified: Secondary | ICD-10-CM | POA: Diagnosis not present

## 2014-09-24 DIAGNOSIS — N186 End stage renal disease: Secondary | ICD-10-CM | POA: Diagnosis not present

## 2014-09-24 DIAGNOSIS — Z992 Dependence on renal dialysis: Secondary | ICD-10-CM | POA: Diagnosis not present

## 2014-09-24 DIAGNOSIS — N2581 Secondary hyperparathyroidism of renal origin: Secondary | ICD-10-CM | POA: Diagnosis not present

## 2014-09-26 DIAGNOSIS — N186 End stage renal disease: Secondary | ICD-10-CM | POA: Diagnosis not present

## 2014-09-26 DIAGNOSIS — N2581 Secondary hyperparathyroidism of renal origin: Secondary | ICD-10-CM | POA: Diagnosis not present

## 2014-09-26 DIAGNOSIS — D509 Iron deficiency anemia, unspecified: Secondary | ICD-10-CM | POA: Diagnosis not present

## 2014-09-26 DIAGNOSIS — Z992 Dependence on renal dialysis: Secondary | ICD-10-CM | POA: Diagnosis not present

## 2014-09-28 DIAGNOSIS — N186 End stage renal disease: Secondary | ICD-10-CM | POA: Diagnosis not present

## 2014-09-28 DIAGNOSIS — D509 Iron deficiency anemia, unspecified: Secondary | ICD-10-CM | POA: Diagnosis not present

## 2014-09-28 DIAGNOSIS — Z992 Dependence on renal dialysis: Secondary | ICD-10-CM | POA: Diagnosis not present

## 2014-09-28 DIAGNOSIS — N2581 Secondary hyperparathyroidism of renal origin: Secondary | ICD-10-CM | POA: Diagnosis not present

## 2014-10-01 DIAGNOSIS — N2581 Secondary hyperparathyroidism of renal origin: Secondary | ICD-10-CM | POA: Diagnosis not present

## 2014-10-01 DIAGNOSIS — N186 End stage renal disease: Secondary | ICD-10-CM | POA: Diagnosis not present

## 2014-10-01 DIAGNOSIS — Z992 Dependence on renal dialysis: Secondary | ICD-10-CM | POA: Diagnosis not present

## 2014-10-01 DIAGNOSIS — D509 Iron deficiency anemia, unspecified: Secondary | ICD-10-CM | POA: Diagnosis not present

## 2014-10-03 DIAGNOSIS — N186 End stage renal disease: Secondary | ICD-10-CM | POA: Diagnosis not present

## 2014-10-03 DIAGNOSIS — N2581 Secondary hyperparathyroidism of renal origin: Secondary | ICD-10-CM | POA: Diagnosis not present

## 2014-10-03 DIAGNOSIS — D509 Iron deficiency anemia, unspecified: Secondary | ICD-10-CM | POA: Diagnosis not present

## 2014-10-03 DIAGNOSIS — Z992 Dependence on renal dialysis: Secondary | ICD-10-CM | POA: Diagnosis not present

## 2014-10-05 DIAGNOSIS — N2581 Secondary hyperparathyroidism of renal origin: Secondary | ICD-10-CM | POA: Diagnosis not present

## 2014-10-05 DIAGNOSIS — D509 Iron deficiency anemia, unspecified: Secondary | ICD-10-CM | POA: Diagnosis not present

## 2014-10-05 DIAGNOSIS — N186 End stage renal disease: Secondary | ICD-10-CM | POA: Diagnosis not present

## 2014-10-05 DIAGNOSIS — Z992 Dependence on renal dialysis: Secondary | ICD-10-CM | POA: Diagnosis not present

## 2014-10-07 ENCOUNTER — Ambulatory Visit (INDEPENDENT_AMBULATORY_CARE_PROVIDER_SITE_OTHER): Payer: Commercial Managed Care - HMO | Admitting: *Deleted

## 2014-10-07 DIAGNOSIS — Z5181 Encounter for therapeutic drug level monitoring: Secondary | ICD-10-CM | POA: Diagnosis not present

## 2014-10-07 DIAGNOSIS — Z7901 Long term (current) use of anticoagulants: Secondary | ICD-10-CM

## 2014-10-07 DIAGNOSIS — I4891 Unspecified atrial fibrillation: Secondary | ICD-10-CM | POA: Diagnosis not present

## 2014-10-07 LAB — POCT INR: INR: 2

## 2014-10-08 DIAGNOSIS — D509 Iron deficiency anemia, unspecified: Secondary | ICD-10-CM | POA: Diagnosis not present

## 2014-10-08 DIAGNOSIS — N2581 Secondary hyperparathyroidism of renal origin: Secondary | ICD-10-CM | POA: Diagnosis not present

## 2014-10-08 DIAGNOSIS — N186 End stage renal disease: Secondary | ICD-10-CM | POA: Diagnosis not present

## 2014-10-08 DIAGNOSIS — Z992 Dependence on renal dialysis: Secondary | ICD-10-CM | POA: Diagnosis not present

## 2014-10-10 DIAGNOSIS — N2581 Secondary hyperparathyroidism of renal origin: Secondary | ICD-10-CM | POA: Diagnosis not present

## 2014-10-10 DIAGNOSIS — Z992 Dependence on renal dialysis: Secondary | ICD-10-CM | POA: Diagnosis not present

## 2014-10-10 DIAGNOSIS — N186 End stage renal disease: Secondary | ICD-10-CM | POA: Diagnosis not present

## 2014-10-10 DIAGNOSIS — D509 Iron deficiency anemia, unspecified: Secondary | ICD-10-CM | POA: Diagnosis not present

## 2014-10-12 DIAGNOSIS — Z992 Dependence on renal dialysis: Secondary | ICD-10-CM | POA: Diagnosis not present

## 2014-10-12 DIAGNOSIS — D509 Iron deficiency anemia, unspecified: Secondary | ICD-10-CM | POA: Diagnosis not present

## 2014-10-12 DIAGNOSIS — N2581 Secondary hyperparathyroidism of renal origin: Secondary | ICD-10-CM | POA: Diagnosis not present

## 2014-10-12 DIAGNOSIS — N186 End stage renal disease: Secondary | ICD-10-CM | POA: Diagnosis not present

## 2014-10-14 DIAGNOSIS — N2581 Secondary hyperparathyroidism of renal origin: Secondary | ICD-10-CM | POA: Diagnosis not present

## 2014-10-14 DIAGNOSIS — N186 End stage renal disease: Secondary | ICD-10-CM | POA: Diagnosis not present

## 2014-10-14 DIAGNOSIS — Z992 Dependence on renal dialysis: Secondary | ICD-10-CM | POA: Diagnosis not present

## 2014-10-14 DIAGNOSIS — D509 Iron deficiency anemia, unspecified: Secondary | ICD-10-CM | POA: Diagnosis not present

## 2014-10-15 DIAGNOSIS — Z85528 Personal history of other malignant neoplasm of kidney: Secondary | ICD-10-CM | POA: Diagnosis not present

## 2014-10-17 DIAGNOSIS — Z992 Dependence on renal dialysis: Secondary | ICD-10-CM | POA: Diagnosis not present

## 2014-10-17 DIAGNOSIS — D509 Iron deficiency anemia, unspecified: Secondary | ICD-10-CM | POA: Diagnosis not present

## 2014-10-17 DIAGNOSIS — N186 End stage renal disease: Secondary | ICD-10-CM | POA: Diagnosis not present

## 2014-10-17 DIAGNOSIS — N2581 Secondary hyperparathyroidism of renal origin: Secondary | ICD-10-CM | POA: Diagnosis not present

## 2014-10-19 DIAGNOSIS — N2581 Secondary hyperparathyroidism of renal origin: Secondary | ICD-10-CM | POA: Diagnosis not present

## 2014-10-19 DIAGNOSIS — D509 Iron deficiency anemia, unspecified: Secondary | ICD-10-CM | POA: Diagnosis not present

## 2014-10-19 DIAGNOSIS — Z992 Dependence on renal dialysis: Secondary | ICD-10-CM | POA: Diagnosis not present

## 2014-10-19 DIAGNOSIS — N186 End stage renal disease: Secondary | ICD-10-CM | POA: Diagnosis not present

## 2014-10-22 DIAGNOSIS — N2581 Secondary hyperparathyroidism of renal origin: Secondary | ICD-10-CM | POA: Diagnosis not present

## 2014-10-22 DIAGNOSIS — Z992 Dependence on renal dialysis: Secondary | ICD-10-CM | POA: Diagnosis not present

## 2014-10-22 DIAGNOSIS — D509 Iron deficiency anemia, unspecified: Secondary | ICD-10-CM | POA: Diagnosis not present

## 2014-10-22 DIAGNOSIS — N186 End stage renal disease: Secondary | ICD-10-CM | POA: Diagnosis not present

## 2014-10-23 DIAGNOSIS — N186 End stage renal disease: Secondary | ICD-10-CM | POA: Diagnosis not present

## 2014-10-23 DIAGNOSIS — Z992 Dependence on renal dialysis: Secondary | ICD-10-CM | POA: Diagnosis not present

## 2014-10-24 DIAGNOSIS — Z992 Dependence on renal dialysis: Secondary | ICD-10-CM | POA: Diagnosis not present

## 2014-10-24 DIAGNOSIS — D631 Anemia in chronic kidney disease: Secondary | ICD-10-CM | POA: Diagnosis not present

## 2014-10-24 DIAGNOSIS — D509 Iron deficiency anemia, unspecified: Secondary | ICD-10-CM | POA: Diagnosis not present

## 2014-10-24 DIAGNOSIS — N2581 Secondary hyperparathyroidism of renal origin: Secondary | ICD-10-CM | POA: Diagnosis not present

## 2014-10-24 DIAGNOSIS — N186 End stage renal disease: Secondary | ICD-10-CM | POA: Diagnosis not present

## 2014-10-24 DIAGNOSIS — E878 Other disorders of electrolyte and fluid balance, not elsewhere classified: Secondary | ICD-10-CM | POA: Diagnosis not present

## 2014-10-24 DIAGNOSIS — Z23 Encounter for immunization: Secondary | ICD-10-CM | POA: Diagnosis not present

## 2014-10-26 DIAGNOSIS — Z992 Dependence on renal dialysis: Secondary | ICD-10-CM | POA: Diagnosis not present

## 2014-10-26 DIAGNOSIS — N186 End stage renal disease: Secondary | ICD-10-CM | POA: Diagnosis not present

## 2014-10-26 DIAGNOSIS — D509 Iron deficiency anemia, unspecified: Secondary | ICD-10-CM | POA: Diagnosis not present

## 2014-10-26 DIAGNOSIS — Z23 Encounter for immunization: Secondary | ICD-10-CM | POA: Diagnosis not present

## 2014-10-26 DIAGNOSIS — N2581 Secondary hyperparathyroidism of renal origin: Secondary | ICD-10-CM | POA: Diagnosis not present

## 2014-10-26 DIAGNOSIS — E878 Other disorders of electrolyte and fluid balance, not elsewhere classified: Secondary | ICD-10-CM | POA: Diagnosis not present

## 2014-10-26 DIAGNOSIS — D631 Anemia in chronic kidney disease: Secondary | ICD-10-CM | POA: Diagnosis not present

## 2014-10-29 DIAGNOSIS — Z23 Encounter for immunization: Secondary | ICD-10-CM | POA: Diagnosis not present

## 2014-10-29 DIAGNOSIS — D509 Iron deficiency anemia, unspecified: Secondary | ICD-10-CM | POA: Diagnosis not present

## 2014-10-29 DIAGNOSIS — N186 End stage renal disease: Secondary | ICD-10-CM | POA: Diagnosis not present

## 2014-10-29 DIAGNOSIS — Z992 Dependence on renal dialysis: Secondary | ICD-10-CM | POA: Diagnosis not present

## 2014-10-29 DIAGNOSIS — D631 Anemia in chronic kidney disease: Secondary | ICD-10-CM | POA: Diagnosis not present

## 2014-10-29 DIAGNOSIS — N2581 Secondary hyperparathyroidism of renal origin: Secondary | ICD-10-CM | POA: Diagnosis not present

## 2014-10-29 DIAGNOSIS — E878 Other disorders of electrolyte and fluid balance, not elsewhere classified: Secondary | ICD-10-CM | POA: Diagnosis not present

## 2014-10-31 DIAGNOSIS — Z23 Encounter for immunization: Secondary | ICD-10-CM | POA: Diagnosis not present

## 2014-10-31 DIAGNOSIS — N2581 Secondary hyperparathyroidism of renal origin: Secondary | ICD-10-CM | POA: Diagnosis not present

## 2014-10-31 DIAGNOSIS — N186 End stage renal disease: Secondary | ICD-10-CM | POA: Diagnosis not present

## 2014-10-31 DIAGNOSIS — Z992 Dependence on renal dialysis: Secondary | ICD-10-CM | POA: Diagnosis not present

## 2014-10-31 DIAGNOSIS — E878 Other disorders of electrolyte and fluid balance, not elsewhere classified: Secondary | ICD-10-CM | POA: Diagnosis not present

## 2014-10-31 DIAGNOSIS — D509 Iron deficiency anemia, unspecified: Secondary | ICD-10-CM | POA: Diagnosis not present

## 2014-10-31 DIAGNOSIS — D631 Anemia in chronic kidney disease: Secondary | ICD-10-CM | POA: Diagnosis not present

## 2014-11-02 DIAGNOSIS — N2581 Secondary hyperparathyroidism of renal origin: Secondary | ICD-10-CM | POA: Diagnosis not present

## 2014-11-02 DIAGNOSIS — N186 End stage renal disease: Secondary | ICD-10-CM | POA: Diagnosis not present

## 2014-11-02 DIAGNOSIS — D509 Iron deficiency anemia, unspecified: Secondary | ICD-10-CM | POA: Diagnosis not present

## 2014-11-02 DIAGNOSIS — E878 Other disorders of electrolyte and fluid balance, not elsewhere classified: Secondary | ICD-10-CM | POA: Diagnosis not present

## 2014-11-02 DIAGNOSIS — Z992 Dependence on renal dialysis: Secondary | ICD-10-CM | POA: Diagnosis not present

## 2014-11-02 DIAGNOSIS — D631 Anemia in chronic kidney disease: Secondary | ICD-10-CM | POA: Diagnosis not present

## 2014-11-02 DIAGNOSIS — Z23 Encounter for immunization: Secondary | ICD-10-CM | POA: Diagnosis not present

## 2014-11-04 ENCOUNTER — Ambulatory Visit (INDEPENDENT_AMBULATORY_CARE_PROVIDER_SITE_OTHER): Payer: Commercial Managed Care - HMO | Admitting: *Deleted

## 2014-11-04 DIAGNOSIS — I4891 Unspecified atrial fibrillation: Secondary | ICD-10-CM

## 2014-11-04 DIAGNOSIS — Z5181 Encounter for therapeutic drug level monitoring: Secondary | ICD-10-CM | POA: Diagnosis not present

## 2014-11-04 DIAGNOSIS — Z7901 Long term (current) use of anticoagulants: Secondary | ICD-10-CM | POA: Diagnosis not present

## 2014-11-04 LAB — POCT INR: INR: 2.2

## 2014-11-05 DIAGNOSIS — D631 Anemia in chronic kidney disease: Secondary | ICD-10-CM | POA: Diagnosis not present

## 2014-11-05 DIAGNOSIS — D509 Iron deficiency anemia, unspecified: Secondary | ICD-10-CM | POA: Diagnosis not present

## 2014-11-05 DIAGNOSIS — Z992 Dependence on renal dialysis: Secondary | ICD-10-CM | POA: Diagnosis not present

## 2014-11-05 DIAGNOSIS — N2581 Secondary hyperparathyroidism of renal origin: Secondary | ICD-10-CM | POA: Diagnosis not present

## 2014-11-05 DIAGNOSIS — E878 Other disorders of electrolyte and fluid balance, not elsewhere classified: Secondary | ICD-10-CM | POA: Diagnosis not present

## 2014-11-05 DIAGNOSIS — Z23 Encounter for immunization: Secondary | ICD-10-CM | POA: Diagnosis not present

## 2014-11-05 DIAGNOSIS — N186 End stage renal disease: Secondary | ICD-10-CM | POA: Diagnosis not present

## 2014-11-07 DIAGNOSIS — N2581 Secondary hyperparathyroidism of renal origin: Secondary | ICD-10-CM | POA: Diagnosis not present

## 2014-11-07 DIAGNOSIS — Z992 Dependence on renal dialysis: Secondary | ICD-10-CM | POA: Diagnosis not present

## 2014-11-07 DIAGNOSIS — Z23 Encounter for immunization: Secondary | ICD-10-CM | POA: Diagnosis not present

## 2014-11-07 DIAGNOSIS — D509 Iron deficiency anemia, unspecified: Secondary | ICD-10-CM | POA: Diagnosis not present

## 2014-11-07 DIAGNOSIS — D631 Anemia in chronic kidney disease: Secondary | ICD-10-CM | POA: Diagnosis not present

## 2014-11-07 DIAGNOSIS — E878 Other disorders of electrolyte and fluid balance, not elsewhere classified: Secondary | ICD-10-CM | POA: Diagnosis not present

## 2014-11-07 DIAGNOSIS — N186 End stage renal disease: Secondary | ICD-10-CM | POA: Diagnosis not present

## 2014-11-09 DIAGNOSIS — N2581 Secondary hyperparathyroidism of renal origin: Secondary | ICD-10-CM | POA: Diagnosis not present

## 2014-11-09 DIAGNOSIS — Z992 Dependence on renal dialysis: Secondary | ICD-10-CM | POA: Diagnosis not present

## 2014-11-09 DIAGNOSIS — E878 Other disorders of electrolyte and fluid balance, not elsewhere classified: Secondary | ICD-10-CM | POA: Diagnosis not present

## 2014-11-09 DIAGNOSIS — N186 End stage renal disease: Secondary | ICD-10-CM | POA: Diagnosis not present

## 2014-11-09 DIAGNOSIS — D631 Anemia in chronic kidney disease: Secondary | ICD-10-CM | POA: Diagnosis not present

## 2014-11-09 DIAGNOSIS — D509 Iron deficiency anemia, unspecified: Secondary | ICD-10-CM | POA: Diagnosis not present

## 2014-11-09 DIAGNOSIS — Z23 Encounter for immunization: Secondary | ICD-10-CM | POA: Diagnosis not present

## 2014-11-12 DIAGNOSIS — D631 Anemia in chronic kidney disease: Secondary | ICD-10-CM | POA: Diagnosis not present

## 2014-11-12 DIAGNOSIS — Z992 Dependence on renal dialysis: Secondary | ICD-10-CM | POA: Diagnosis not present

## 2014-11-12 DIAGNOSIS — E878 Other disorders of electrolyte and fluid balance, not elsewhere classified: Secondary | ICD-10-CM | POA: Diagnosis not present

## 2014-11-12 DIAGNOSIS — N2581 Secondary hyperparathyroidism of renal origin: Secondary | ICD-10-CM | POA: Diagnosis not present

## 2014-11-12 DIAGNOSIS — D509 Iron deficiency anemia, unspecified: Secondary | ICD-10-CM | POA: Diagnosis not present

## 2014-11-12 DIAGNOSIS — Z23 Encounter for immunization: Secondary | ICD-10-CM | POA: Diagnosis not present

## 2014-11-12 DIAGNOSIS — N186 End stage renal disease: Secondary | ICD-10-CM | POA: Diagnosis not present

## 2014-11-14 DIAGNOSIS — Z23 Encounter for immunization: Secondary | ICD-10-CM | POA: Diagnosis not present

## 2014-11-14 DIAGNOSIS — N2581 Secondary hyperparathyroidism of renal origin: Secondary | ICD-10-CM | POA: Diagnosis not present

## 2014-11-14 DIAGNOSIS — N186 End stage renal disease: Secondary | ICD-10-CM | POA: Diagnosis not present

## 2014-11-14 DIAGNOSIS — D509 Iron deficiency anemia, unspecified: Secondary | ICD-10-CM | POA: Diagnosis not present

## 2014-11-14 DIAGNOSIS — Z992 Dependence on renal dialysis: Secondary | ICD-10-CM | POA: Diagnosis not present

## 2014-11-14 DIAGNOSIS — D631 Anemia in chronic kidney disease: Secondary | ICD-10-CM | POA: Diagnosis not present

## 2014-11-14 DIAGNOSIS — E878 Other disorders of electrolyte and fluid balance, not elsewhere classified: Secondary | ICD-10-CM | POA: Diagnosis not present

## 2014-11-16 DIAGNOSIS — Z992 Dependence on renal dialysis: Secondary | ICD-10-CM | POA: Diagnosis not present

## 2014-11-16 DIAGNOSIS — D509 Iron deficiency anemia, unspecified: Secondary | ICD-10-CM | POA: Diagnosis not present

## 2014-11-16 DIAGNOSIS — D631 Anemia in chronic kidney disease: Secondary | ICD-10-CM | POA: Diagnosis not present

## 2014-11-16 DIAGNOSIS — E878 Other disorders of electrolyte and fluid balance, not elsewhere classified: Secondary | ICD-10-CM | POA: Diagnosis not present

## 2014-11-16 DIAGNOSIS — N186 End stage renal disease: Secondary | ICD-10-CM | POA: Diagnosis not present

## 2014-11-16 DIAGNOSIS — Z23 Encounter for immunization: Secondary | ICD-10-CM | POA: Diagnosis not present

## 2014-11-16 DIAGNOSIS — N2581 Secondary hyperparathyroidism of renal origin: Secondary | ICD-10-CM | POA: Diagnosis not present

## 2014-11-19 DIAGNOSIS — E878 Other disorders of electrolyte and fluid balance, not elsewhere classified: Secondary | ICD-10-CM | POA: Diagnosis not present

## 2014-11-19 DIAGNOSIS — Z23 Encounter for immunization: Secondary | ICD-10-CM | POA: Diagnosis not present

## 2014-11-19 DIAGNOSIS — D631 Anemia in chronic kidney disease: Secondary | ICD-10-CM | POA: Diagnosis not present

## 2014-11-19 DIAGNOSIS — N2581 Secondary hyperparathyroidism of renal origin: Secondary | ICD-10-CM | POA: Diagnosis not present

## 2014-11-19 DIAGNOSIS — D509 Iron deficiency anemia, unspecified: Secondary | ICD-10-CM | POA: Diagnosis not present

## 2014-11-19 DIAGNOSIS — N186 End stage renal disease: Secondary | ICD-10-CM | POA: Diagnosis not present

## 2014-11-19 DIAGNOSIS — Z992 Dependence on renal dialysis: Secondary | ICD-10-CM | POA: Diagnosis not present

## 2014-11-21 DIAGNOSIS — D509 Iron deficiency anemia, unspecified: Secondary | ICD-10-CM | POA: Diagnosis not present

## 2014-11-21 DIAGNOSIS — Z23 Encounter for immunization: Secondary | ICD-10-CM | POA: Diagnosis not present

## 2014-11-21 DIAGNOSIS — N186 End stage renal disease: Secondary | ICD-10-CM | POA: Diagnosis not present

## 2014-11-21 DIAGNOSIS — D631 Anemia in chronic kidney disease: Secondary | ICD-10-CM | POA: Diagnosis not present

## 2014-11-21 DIAGNOSIS — Z992 Dependence on renal dialysis: Secondary | ICD-10-CM | POA: Diagnosis not present

## 2014-11-21 DIAGNOSIS — E878 Other disorders of electrolyte and fluid balance, not elsewhere classified: Secondary | ICD-10-CM | POA: Diagnosis not present

## 2014-11-21 DIAGNOSIS — N2581 Secondary hyperparathyroidism of renal origin: Secondary | ICD-10-CM | POA: Diagnosis not present

## 2014-11-22 DIAGNOSIS — N186 End stage renal disease: Secondary | ICD-10-CM | POA: Diagnosis not present

## 2014-11-22 DIAGNOSIS — Z992 Dependence on renal dialysis: Secondary | ICD-10-CM | POA: Diagnosis not present

## 2014-11-23 DIAGNOSIS — N186 End stage renal disease: Secondary | ICD-10-CM | POA: Diagnosis not present

## 2014-11-23 DIAGNOSIS — D631 Anemia in chronic kidney disease: Secondary | ICD-10-CM | POA: Diagnosis not present

## 2014-11-23 DIAGNOSIS — Z992 Dependence on renal dialysis: Secondary | ICD-10-CM | POA: Diagnosis not present

## 2014-11-23 DIAGNOSIS — N2581 Secondary hyperparathyroidism of renal origin: Secondary | ICD-10-CM | POA: Diagnosis not present

## 2014-11-23 DIAGNOSIS — Z23 Encounter for immunization: Secondary | ICD-10-CM | POA: Diagnosis not present

## 2014-11-23 DIAGNOSIS — D509 Iron deficiency anemia, unspecified: Secondary | ICD-10-CM | POA: Diagnosis not present

## 2014-11-26 DIAGNOSIS — D631 Anemia in chronic kidney disease: Secondary | ICD-10-CM | POA: Diagnosis not present

## 2014-11-26 DIAGNOSIS — Z23 Encounter for immunization: Secondary | ICD-10-CM | POA: Diagnosis not present

## 2014-11-26 DIAGNOSIS — N2581 Secondary hyperparathyroidism of renal origin: Secondary | ICD-10-CM | POA: Diagnosis not present

## 2014-11-26 DIAGNOSIS — Z992 Dependence on renal dialysis: Secondary | ICD-10-CM | POA: Diagnosis not present

## 2014-11-26 DIAGNOSIS — N186 End stage renal disease: Secondary | ICD-10-CM | POA: Diagnosis not present

## 2014-11-26 DIAGNOSIS — D509 Iron deficiency anemia, unspecified: Secondary | ICD-10-CM | POA: Diagnosis not present

## 2014-11-28 DIAGNOSIS — N186 End stage renal disease: Secondary | ICD-10-CM | POA: Diagnosis not present

## 2014-11-28 DIAGNOSIS — Z992 Dependence on renal dialysis: Secondary | ICD-10-CM | POA: Diagnosis not present

## 2014-11-28 DIAGNOSIS — N2581 Secondary hyperparathyroidism of renal origin: Secondary | ICD-10-CM | POA: Diagnosis not present

## 2014-11-28 DIAGNOSIS — D631 Anemia in chronic kidney disease: Secondary | ICD-10-CM | POA: Diagnosis not present

## 2014-11-28 DIAGNOSIS — D509 Iron deficiency anemia, unspecified: Secondary | ICD-10-CM | POA: Diagnosis not present

## 2014-11-28 DIAGNOSIS — Z23 Encounter for immunization: Secondary | ICD-10-CM | POA: Diagnosis not present

## 2014-11-30 DIAGNOSIS — Z23 Encounter for immunization: Secondary | ICD-10-CM | POA: Diagnosis not present

## 2014-11-30 DIAGNOSIS — N2581 Secondary hyperparathyroidism of renal origin: Secondary | ICD-10-CM | POA: Diagnosis not present

## 2014-11-30 DIAGNOSIS — N186 End stage renal disease: Secondary | ICD-10-CM | POA: Diagnosis not present

## 2014-11-30 DIAGNOSIS — D509 Iron deficiency anemia, unspecified: Secondary | ICD-10-CM | POA: Diagnosis not present

## 2014-11-30 DIAGNOSIS — D631 Anemia in chronic kidney disease: Secondary | ICD-10-CM | POA: Diagnosis not present

## 2014-11-30 DIAGNOSIS — Z992 Dependence on renal dialysis: Secondary | ICD-10-CM | POA: Diagnosis not present

## 2014-12-03 DIAGNOSIS — N2581 Secondary hyperparathyroidism of renal origin: Secondary | ICD-10-CM | POA: Diagnosis not present

## 2014-12-03 DIAGNOSIS — Z23 Encounter for immunization: Secondary | ICD-10-CM | POA: Diagnosis not present

## 2014-12-03 DIAGNOSIS — D631 Anemia in chronic kidney disease: Secondary | ICD-10-CM | POA: Diagnosis not present

## 2014-12-03 DIAGNOSIS — D509 Iron deficiency anemia, unspecified: Secondary | ICD-10-CM | POA: Diagnosis not present

## 2014-12-03 DIAGNOSIS — Z992 Dependence on renal dialysis: Secondary | ICD-10-CM | POA: Diagnosis not present

## 2014-12-03 DIAGNOSIS — N186 End stage renal disease: Secondary | ICD-10-CM | POA: Diagnosis not present

## 2014-12-05 DIAGNOSIS — D509 Iron deficiency anemia, unspecified: Secondary | ICD-10-CM | POA: Diagnosis not present

## 2014-12-05 DIAGNOSIS — N2581 Secondary hyperparathyroidism of renal origin: Secondary | ICD-10-CM | POA: Diagnosis not present

## 2014-12-05 DIAGNOSIS — N186 End stage renal disease: Secondary | ICD-10-CM | POA: Diagnosis not present

## 2014-12-05 DIAGNOSIS — Z992 Dependence on renal dialysis: Secondary | ICD-10-CM | POA: Diagnosis not present

## 2014-12-05 DIAGNOSIS — Z23 Encounter for immunization: Secondary | ICD-10-CM | POA: Diagnosis not present

## 2014-12-05 DIAGNOSIS — D631 Anemia in chronic kidney disease: Secondary | ICD-10-CM | POA: Diagnosis not present

## 2014-12-07 DIAGNOSIS — Z992 Dependence on renal dialysis: Secondary | ICD-10-CM | POA: Diagnosis not present

## 2014-12-07 DIAGNOSIS — Z23 Encounter for immunization: Secondary | ICD-10-CM | POA: Diagnosis not present

## 2014-12-07 DIAGNOSIS — D631 Anemia in chronic kidney disease: Secondary | ICD-10-CM | POA: Diagnosis not present

## 2014-12-07 DIAGNOSIS — N2581 Secondary hyperparathyroidism of renal origin: Secondary | ICD-10-CM | POA: Diagnosis not present

## 2014-12-07 DIAGNOSIS — N186 End stage renal disease: Secondary | ICD-10-CM | POA: Diagnosis not present

## 2014-12-07 DIAGNOSIS — D509 Iron deficiency anemia, unspecified: Secondary | ICD-10-CM | POA: Diagnosis not present

## 2014-12-10 DIAGNOSIS — N2581 Secondary hyperparathyroidism of renal origin: Secondary | ICD-10-CM | POA: Diagnosis not present

## 2014-12-10 DIAGNOSIS — D631 Anemia in chronic kidney disease: Secondary | ICD-10-CM | POA: Diagnosis not present

## 2014-12-10 DIAGNOSIS — N186 End stage renal disease: Secondary | ICD-10-CM | POA: Diagnosis not present

## 2014-12-10 DIAGNOSIS — Z23 Encounter for immunization: Secondary | ICD-10-CM | POA: Diagnosis not present

## 2014-12-10 DIAGNOSIS — Z992 Dependence on renal dialysis: Secondary | ICD-10-CM | POA: Diagnosis not present

## 2014-12-10 DIAGNOSIS — D509 Iron deficiency anemia, unspecified: Secondary | ICD-10-CM | POA: Diagnosis not present

## 2014-12-12 DIAGNOSIS — D509 Iron deficiency anemia, unspecified: Secondary | ICD-10-CM | POA: Diagnosis not present

## 2014-12-12 DIAGNOSIS — D631 Anemia in chronic kidney disease: Secondary | ICD-10-CM | POA: Diagnosis not present

## 2014-12-12 DIAGNOSIS — Z23 Encounter for immunization: Secondary | ICD-10-CM | POA: Diagnosis not present

## 2014-12-12 DIAGNOSIS — N2581 Secondary hyperparathyroidism of renal origin: Secondary | ICD-10-CM | POA: Diagnosis not present

## 2014-12-12 DIAGNOSIS — N186 End stage renal disease: Secondary | ICD-10-CM | POA: Diagnosis not present

## 2014-12-12 DIAGNOSIS — Z992 Dependence on renal dialysis: Secondary | ICD-10-CM | POA: Diagnosis not present

## 2014-12-14 DIAGNOSIS — D509 Iron deficiency anemia, unspecified: Secondary | ICD-10-CM | POA: Diagnosis not present

## 2014-12-14 DIAGNOSIS — Z23 Encounter for immunization: Secondary | ICD-10-CM | POA: Diagnosis not present

## 2014-12-14 DIAGNOSIS — N186 End stage renal disease: Secondary | ICD-10-CM | POA: Diagnosis not present

## 2014-12-14 DIAGNOSIS — N2581 Secondary hyperparathyroidism of renal origin: Secondary | ICD-10-CM | POA: Diagnosis not present

## 2014-12-14 DIAGNOSIS — Z992 Dependence on renal dialysis: Secondary | ICD-10-CM | POA: Diagnosis not present

## 2014-12-14 DIAGNOSIS — D631 Anemia in chronic kidney disease: Secondary | ICD-10-CM | POA: Diagnosis not present

## 2014-12-16 ENCOUNTER — Ambulatory Visit (INDEPENDENT_AMBULATORY_CARE_PROVIDER_SITE_OTHER): Payer: Commercial Managed Care - HMO | Admitting: *Deleted

## 2014-12-16 DIAGNOSIS — I4891 Unspecified atrial fibrillation: Secondary | ICD-10-CM | POA: Diagnosis not present

## 2014-12-16 DIAGNOSIS — Z5181 Encounter for therapeutic drug level monitoring: Secondary | ICD-10-CM | POA: Diagnosis not present

## 2014-12-16 DIAGNOSIS — Z7901 Long term (current) use of anticoagulants: Secondary | ICD-10-CM

## 2014-12-16 LAB — POCT INR: INR: 2.5

## 2014-12-17 DIAGNOSIS — N186 End stage renal disease: Secondary | ICD-10-CM | POA: Diagnosis not present

## 2014-12-17 DIAGNOSIS — D509 Iron deficiency anemia, unspecified: Secondary | ICD-10-CM | POA: Diagnosis not present

## 2014-12-17 DIAGNOSIS — N2581 Secondary hyperparathyroidism of renal origin: Secondary | ICD-10-CM | POA: Diagnosis not present

## 2014-12-17 DIAGNOSIS — Z23 Encounter for immunization: Secondary | ICD-10-CM | POA: Diagnosis not present

## 2014-12-17 DIAGNOSIS — Z992 Dependence on renal dialysis: Secondary | ICD-10-CM | POA: Diagnosis not present

## 2014-12-17 DIAGNOSIS — D631 Anemia in chronic kidney disease: Secondary | ICD-10-CM | POA: Diagnosis not present

## 2014-12-19 DIAGNOSIS — Z992 Dependence on renal dialysis: Secondary | ICD-10-CM | POA: Diagnosis not present

## 2014-12-19 DIAGNOSIS — N2581 Secondary hyperparathyroidism of renal origin: Secondary | ICD-10-CM | POA: Diagnosis not present

## 2014-12-19 DIAGNOSIS — D509 Iron deficiency anemia, unspecified: Secondary | ICD-10-CM | POA: Diagnosis not present

## 2014-12-19 DIAGNOSIS — N186 End stage renal disease: Secondary | ICD-10-CM | POA: Diagnosis not present

## 2014-12-19 DIAGNOSIS — D631 Anemia in chronic kidney disease: Secondary | ICD-10-CM | POA: Diagnosis not present

## 2014-12-19 DIAGNOSIS — Z23 Encounter for immunization: Secondary | ICD-10-CM | POA: Diagnosis not present

## 2014-12-21 DIAGNOSIS — D631 Anemia in chronic kidney disease: Secondary | ICD-10-CM | POA: Diagnosis not present

## 2014-12-21 DIAGNOSIS — N186 End stage renal disease: Secondary | ICD-10-CM | POA: Diagnosis not present

## 2014-12-21 DIAGNOSIS — N2581 Secondary hyperparathyroidism of renal origin: Secondary | ICD-10-CM | POA: Diagnosis not present

## 2014-12-21 DIAGNOSIS — Z23 Encounter for immunization: Secondary | ICD-10-CM | POA: Diagnosis not present

## 2014-12-21 DIAGNOSIS — Z992 Dependence on renal dialysis: Secondary | ICD-10-CM | POA: Diagnosis not present

## 2014-12-21 DIAGNOSIS — D509 Iron deficiency anemia, unspecified: Secondary | ICD-10-CM | POA: Diagnosis not present

## 2014-12-23 DIAGNOSIS — N186 End stage renal disease: Secondary | ICD-10-CM | POA: Diagnosis not present

## 2014-12-23 DIAGNOSIS — Z992 Dependence on renal dialysis: Secondary | ICD-10-CM | POA: Diagnosis not present

## 2014-12-24 DIAGNOSIS — Z992 Dependence on renal dialysis: Secondary | ICD-10-CM | POA: Diagnosis not present

## 2014-12-24 DIAGNOSIS — N186 End stage renal disease: Secondary | ICD-10-CM | POA: Diagnosis not present

## 2014-12-24 DIAGNOSIS — D509 Iron deficiency anemia, unspecified: Secondary | ICD-10-CM | POA: Diagnosis not present

## 2014-12-24 DIAGNOSIS — N2581 Secondary hyperparathyroidism of renal origin: Secondary | ICD-10-CM | POA: Diagnosis not present

## 2014-12-26 DIAGNOSIS — N186 End stage renal disease: Secondary | ICD-10-CM | POA: Diagnosis not present

## 2014-12-26 DIAGNOSIS — N2581 Secondary hyperparathyroidism of renal origin: Secondary | ICD-10-CM | POA: Diagnosis not present

## 2014-12-26 DIAGNOSIS — D509 Iron deficiency anemia, unspecified: Secondary | ICD-10-CM | POA: Diagnosis not present

## 2014-12-26 DIAGNOSIS — Z992 Dependence on renal dialysis: Secondary | ICD-10-CM | POA: Diagnosis not present

## 2014-12-28 DIAGNOSIS — Z992 Dependence on renal dialysis: Secondary | ICD-10-CM | POA: Diagnosis not present

## 2014-12-28 DIAGNOSIS — D509 Iron deficiency anemia, unspecified: Secondary | ICD-10-CM | POA: Diagnosis not present

## 2014-12-28 DIAGNOSIS — N186 End stage renal disease: Secondary | ICD-10-CM | POA: Diagnosis not present

## 2014-12-28 DIAGNOSIS — N2581 Secondary hyperparathyroidism of renal origin: Secondary | ICD-10-CM | POA: Diagnosis not present

## 2014-12-31 DIAGNOSIS — Z992 Dependence on renal dialysis: Secondary | ICD-10-CM | POA: Diagnosis not present

## 2014-12-31 DIAGNOSIS — D509 Iron deficiency anemia, unspecified: Secondary | ICD-10-CM | POA: Diagnosis not present

## 2014-12-31 DIAGNOSIS — N186 End stage renal disease: Secondary | ICD-10-CM | POA: Diagnosis not present

## 2014-12-31 DIAGNOSIS — N2581 Secondary hyperparathyroidism of renal origin: Secondary | ICD-10-CM | POA: Diagnosis not present

## 2015-01-02 DIAGNOSIS — N186 End stage renal disease: Secondary | ICD-10-CM | POA: Diagnosis not present

## 2015-01-02 DIAGNOSIS — Z992 Dependence on renal dialysis: Secondary | ICD-10-CM | POA: Diagnosis not present

## 2015-01-02 DIAGNOSIS — N2581 Secondary hyperparathyroidism of renal origin: Secondary | ICD-10-CM | POA: Diagnosis not present

## 2015-01-02 DIAGNOSIS — D509 Iron deficiency anemia, unspecified: Secondary | ICD-10-CM | POA: Diagnosis not present

## 2015-01-04 DIAGNOSIS — N186 End stage renal disease: Secondary | ICD-10-CM | POA: Diagnosis not present

## 2015-01-04 DIAGNOSIS — Z992 Dependence on renal dialysis: Secondary | ICD-10-CM | POA: Diagnosis not present

## 2015-01-04 DIAGNOSIS — D509 Iron deficiency anemia, unspecified: Secondary | ICD-10-CM | POA: Diagnosis not present

## 2015-01-04 DIAGNOSIS — N2581 Secondary hyperparathyroidism of renal origin: Secondary | ICD-10-CM | POA: Diagnosis not present

## 2015-01-07 DIAGNOSIS — N2581 Secondary hyperparathyroidism of renal origin: Secondary | ICD-10-CM | POA: Diagnosis not present

## 2015-01-07 DIAGNOSIS — D509 Iron deficiency anemia, unspecified: Secondary | ICD-10-CM | POA: Diagnosis not present

## 2015-01-07 DIAGNOSIS — Z992 Dependence on renal dialysis: Secondary | ICD-10-CM | POA: Diagnosis not present

## 2015-01-07 DIAGNOSIS — N186 End stage renal disease: Secondary | ICD-10-CM | POA: Diagnosis not present

## 2015-01-09 DIAGNOSIS — Z992 Dependence on renal dialysis: Secondary | ICD-10-CM | POA: Diagnosis not present

## 2015-01-09 DIAGNOSIS — N2581 Secondary hyperparathyroidism of renal origin: Secondary | ICD-10-CM | POA: Diagnosis not present

## 2015-01-09 DIAGNOSIS — D509 Iron deficiency anemia, unspecified: Secondary | ICD-10-CM | POA: Diagnosis not present

## 2015-01-09 DIAGNOSIS — N186 End stage renal disease: Secondary | ICD-10-CM | POA: Diagnosis not present

## 2015-01-10 DIAGNOSIS — N186 End stage renal disease: Secondary | ICD-10-CM | POA: Diagnosis not present

## 2015-01-10 DIAGNOSIS — D509 Iron deficiency anemia, unspecified: Secondary | ICD-10-CM | POA: Diagnosis not present

## 2015-01-10 DIAGNOSIS — Z992 Dependence on renal dialysis: Secondary | ICD-10-CM | POA: Diagnosis not present

## 2015-01-10 DIAGNOSIS — N2581 Secondary hyperparathyroidism of renal origin: Secondary | ICD-10-CM | POA: Diagnosis not present

## 2015-01-14 DIAGNOSIS — D509 Iron deficiency anemia, unspecified: Secondary | ICD-10-CM | POA: Diagnosis not present

## 2015-01-14 DIAGNOSIS — N2581 Secondary hyperparathyroidism of renal origin: Secondary | ICD-10-CM | POA: Diagnosis not present

## 2015-01-14 DIAGNOSIS — N186 End stage renal disease: Secondary | ICD-10-CM | POA: Diagnosis not present

## 2015-01-14 DIAGNOSIS — Z992 Dependence on renal dialysis: Secondary | ICD-10-CM | POA: Diagnosis not present

## 2015-01-16 DIAGNOSIS — N186 End stage renal disease: Secondary | ICD-10-CM | POA: Diagnosis not present

## 2015-01-16 DIAGNOSIS — Z992 Dependence on renal dialysis: Secondary | ICD-10-CM | POA: Diagnosis not present

## 2015-01-16 DIAGNOSIS — D509 Iron deficiency anemia, unspecified: Secondary | ICD-10-CM | POA: Diagnosis not present

## 2015-01-16 DIAGNOSIS — N2581 Secondary hyperparathyroidism of renal origin: Secondary | ICD-10-CM | POA: Diagnosis not present

## 2015-01-18 DIAGNOSIS — N186 End stage renal disease: Secondary | ICD-10-CM | POA: Diagnosis not present

## 2015-01-18 DIAGNOSIS — D509 Iron deficiency anemia, unspecified: Secondary | ICD-10-CM | POA: Diagnosis not present

## 2015-01-18 DIAGNOSIS — N2581 Secondary hyperparathyroidism of renal origin: Secondary | ICD-10-CM | POA: Diagnosis not present

## 2015-01-18 DIAGNOSIS — Z992 Dependence on renal dialysis: Secondary | ICD-10-CM | POA: Diagnosis not present

## 2015-01-21 DIAGNOSIS — D509 Iron deficiency anemia, unspecified: Secondary | ICD-10-CM | POA: Diagnosis not present

## 2015-01-21 DIAGNOSIS — N186 End stage renal disease: Secondary | ICD-10-CM | POA: Diagnosis not present

## 2015-01-21 DIAGNOSIS — Z992 Dependence on renal dialysis: Secondary | ICD-10-CM | POA: Diagnosis not present

## 2015-01-21 DIAGNOSIS — N2581 Secondary hyperparathyroidism of renal origin: Secondary | ICD-10-CM | POA: Diagnosis not present

## 2015-01-22 DIAGNOSIS — Z992 Dependence on renal dialysis: Secondary | ICD-10-CM | POA: Diagnosis not present

## 2015-01-22 DIAGNOSIS — N186 End stage renal disease: Secondary | ICD-10-CM | POA: Diagnosis not present

## 2015-01-23 DIAGNOSIS — Z992 Dependence on renal dialysis: Secondary | ICD-10-CM | POA: Diagnosis not present

## 2015-01-23 DIAGNOSIS — N186 End stage renal disease: Secondary | ICD-10-CM | POA: Diagnosis not present

## 2015-01-23 DIAGNOSIS — D631 Anemia in chronic kidney disease: Secondary | ICD-10-CM | POA: Diagnosis not present

## 2015-01-23 DIAGNOSIS — N2581 Secondary hyperparathyroidism of renal origin: Secondary | ICD-10-CM | POA: Diagnosis not present

## 2015-01-23 DIAGNOSIS — D509 Iron deficiency anemia, unspecified: Secondary | ICD-10-CM | POA: Diagnosis not present

## 2015-01-25 DIAGNOSIS — D631 Anemia in chronic kidney disease: Secondary | ICD-10-CM | POA: Diagnosis not present

## 2015-01-25 DIAGNOSIS — N2581 Secondary hyperparathyroidism of renal origin: Secondary | ICD-10-CM | POA: Diagnosis not present

## 2015-01-25 DIAGNOSIS — N186 End stage renal disease: Secondary | ICD-10-CM | POA: Diagnosis not present

## 2015-01-25 DIAGNOSIS — D509 Iron deficiency anemia, unspecified: Secondary | ICD-10-CM | POA: Diagnosis not present

## 2015-01-25 DIAGNOSIS — Z992 Dependence on renal dialysis: Secondary | ICD-10-CM | POA: Diagnosis not present

## 2015-01-27 ENCOUNTER — Ambulatory Visit (INDEPENDENT_AMBULATORY_CARE_PROVIDER_SITE_OTHER): Payer: Commercial Managed Care - HMO | Admitting: *Deleted

## 2015-01-27 DIAGNOSIS — Z5181 Encounter for therapeutic drug level monitoring: Secondary | ICD-10-CM | POA: Diagnosis not present

## 2015-01-27 DIAGNOSIS — Z7901 Long term (current) use of anticoagulants: Secondary | ICD-10-CM

## 2015-01-27 DIAGNOSIS — I4891 Unspecified atrial fibrillation: Secondary | ICD-10-CM

## 2015-01-27 LAB — POCT INR: INR: 4

## 2015-01-28 DIAGNOSIS — Z992 Dependence on renal dialysis: Secondary | ICD-10-CM | POA: Diagnosis not present

## 2015-01-28 DIAGNOSIS — D509 Iron deficiency anemia, unspecified: Secondary | ICD-10-CM | POA: Diagnosis not present

## 2015-01-28 DIAGNOSIS — D631 Anemia in chronic kidney disease: Secondary | ICD-10-CM | POA: Diagnosis not present

## 2015-01-28 DIAGNOSIS — N2581 Secondary hyperparathyroidism of renal origin: Secondary | ICD-10-CM | POA: Diagnosis not present

## 2015-01-28 DIAGNOSIS — N186 End stage renal disease: Secondary | ICD-10-CM | POA: Diagnosis not present

## 2015-01-30 DIAGNOSIS — N2581 Secondary hyperparathyroidism of renal origin: Secondary | ICD-10-CM | POA: Diagnosis not present

## 2015-01-30 DIAGNOSIS — N186 End stage renal disease: Secondary | ICD-10-CM | POA: Diagnosis not present

## 2015-01-30 DIAGNOSIS — Z992 Dependence on renal dialysis: Secondary | ICD-10-CM | POA: Diagnosis not present

## 2015-01-30 DIAGNOSIS — D509 Iron deficiency anemia, unspecified: Secondary | ICD-10-CM | POA: Diagnosis not present

## 2015-01-30 DIAGNOSIS — D631 Anemia in chronic kidney disease: Secondary | ICD-10-CM | POA: Diagnosis not present

## 2015-02-01 DIAGNOSIS — D631 Anemia in chronic kidney disease: Secondary | ICD-10-CM | POA: Diagnosis not present

## 2015-02-01 DIAGNOSIS — Z992 Dependence on renal dialysis: Secondary | ICD-10-CM | POA: Diagnosis not present

## 2015-02-01 DIAGNOSIS — D509 Iron deficiency anemia, unspecified: Secondary | ICD-10-CM | POA: Diagnosis not present

## 2015-02-01 DIAGNOSIS — N186 End stage renal disease: Secondary | ICD-10-CM | POA: Diagnosis not present

## 2015-02-01 DIAGNOSIS — N2581 Secondary hyperparathyroidism of renal origin: Secondary | ICD-10-CM | POA: Diagnosis not present

## 2015-02-04 DIAGNOSIS — D631 Anemia in chronic kidney disease: Secondary | ICD-10-CM | POA: Diagnosis not present

## 2015-02-04 DIAGNOSIS — N2581 Secondary hyperparathyroidism of renal origin: Secondary | ICD-10-CM | POA: Diagnosis not present

## 2015-02-04 DIAGNOSIS — D509 Iron deficiency anemia, unspecified: Secondary | ICD-10-CM | POA: Diagnosis not present

## 2015-02-04 DIAGNOSIS — N186 End stage renal disease: Secondary | ICD-10-CM | POA: Diagnosis not present

## 2015-02-04 DIAGNOSIS — Z992 Dependence on renal dialysis: Secondary | ICD-10-CM | POA: Diagnosis not present

## 2015-02-06 DIAGNOSIS — N2581 Secondary hyperparathyroidism of renal origin: Secondary | ICD-10-CM | POA: Diagnosis not present

## 2015-02-06 DIAGNOSIS — D631 Anemia in chronic kidney disease: Secondary | ICD-10-CM | POA: Diagnosis not present

## 2015-02-06 DIAGNOSIS — Z992 Dependence on renal dialysis: Secondary | ICD-10-CM | POA: Diagnosis not present

## 2015-02-06 DIAGNOSIS — N186 End stage renal disease: Secondary | ICD-10-CM | POA: Diagnosis not present

## 2015-02-06 DIAGNOSIS — D509 Iron deficiency anemia, unspecified: Secondary | ICD-10-CM | POA: Diagnosis not present

## 2015-02-08 DIAGNOSIS — N186 End stage renal disease: Secondary | ICD-10-CM | POA: Diagnosis not present

## 2015-02-08 DIAGNOSIS — D631 Anemia in chronic kidney disease: Secondary | ICD-10-CM | POA: Diagnosis not present

## 2015-02-08 DIAGNOSIS — Z992 Dependence on renal dialysis: Secondary | ICD-10-CM | POA: Diagnosis not present

## 2015-02-08 DIAGNOSIS — D509 Iron deficiency anemia, unspecified: Secondary | ICD-10-CM | POA: Diagnosis not present

## 2015-02-08 DIAGNOSIS — N2581 Secondary hyperparathyroidism of renal origin: Secondary | ICD-10-CM | POA: Diagnosis not present

## 2015-02-10 ENCOUNTER — Ambulatory Visit (INDEPENDENT_AMBULATORY_CARE_PROVIDER_SITE_OTHER): Payer: Commercial Managed Care - HMO | Admitting: *Deleted

## 2015-02-10 DIAGNOSIS — Z7901 Long term (current) use of anticoagulants: Secondary | ICD-10-CM

## 2015-02-10 DIAGNOSIS — I4891 Unspecified atrial fibrillation: Secondary | ICD-10-CM | POA: Diagnosis not present

## 2015-02-10 DIAGNOSIS — Z5181 Encounter for therapeutic drug level monitoring: Secondary | ICD-10-CM

## 2015-02-10 LAB — POCT INR: INR: 2.8

## 2015-02-11 DIAGNOSIS — Z992 Dependence on renal dialysis: Secondary | ICD-10-CM | POA: Diagnosis not present

## 2015-02-11 DIAGNOSIS — N2581 Secondary hyperparathyroidism of renal origin: Secondary | ICD-10-CM | POA: Diagnosis not present

## 2015-02-11 DIAGNOSIS — D509 Iron deficiency anemia, unspecified: Secondary | ICD-10-CM | POA: Diagnosis not present

## 2015-02-11 DIAGNOSIS — N186 End stage renal disease: Secondary | ICD-10-CM | POA: Diagnosis not present

## 2015-02-11 DIAGNOSIS — D631 Anemia in chronic kidney disease: Secondary | ICD-10-CM | POA: Diagnosis not present

## 2015-02-13 DIAGNOSIS — D631 Anemia in chronic kidney disease: Secondary | ICD-10-CM | POA: Diagnosis not present

## 2015-02-13 DIAGNOSIS — D509 Iron deficiency anemia, unspecified: Secondary | ICD-10-CM | POA: Diagnosis not present

## 2015-02-13 DIAGNOSIS — N186 End stage renal disease: Secondary | ICD-10-CM | POA: Diagnosis not present

## 2015-02-13 DIAGNOSIS — Z992 Dependence on renal dialysis: Secondary | ICD-10-CM | POA: Diagnosis not present

## 2015-02-13 DIAGNOSIS — N2581 Secondary hyperparathyroidism of renal origin: Secondary | ICD-10-CM | POA: Diagnosis not present

## 2015-02-15 DIAGNOSIS — Z992 Dependence on renal dialysis: Secondary | ICD-10-CM | POA: Diagnosis not present

## 2015-02-15 DIAGNOSIS — D509 Iron deficiency anemia, unspecified: Secondary | ICD-10-CM | POA: Diagnosis not present

## 2015-02-15 DIAGNOSIS — D631 Anemia in chronic kidney disease: Secondary | ICD-10-CM | POA: Diagnosis not present

## 2015-02-15 DIAGNOSIS — N2581 Secondary hyperparathyroidism of renal origin: Secondary | ICD-10-CM | POA: Diagnosis not present

## 2015-02-15 DIAGNOSIS — N186 End stage renal disease: Secondary | ICD-10-CM | POA: Diagnosis not present

## 2015-02-18 DIAGNOSIS — N2581 Secondary hyperparathyroidism of renal origin: Secondary | ICD-10-CM | POA: Diagnosis not present

## 2015-02-18 DIAGNOSIS — D509 Iron deficiency anemia, unspecified: Secondary | ICD-10-CM | POA: Diagnosis not present

## 2015-02-18 DIAGNOSIS — D631 Anemia in chronic kidney disease: Secondary | ICD-10-CM | POA: Diagnosis not present

## 2015-02-18 DIAGNOSIS — Z992 Dependence on renal dialysis: Secondary | ICD-10-CM | POA: Diagnosis not present

## 2015-02-18 DIAGNOSIS — N186 End stage renal disease: Secondary | ICD-10-CM | POA: Diagnosis not present

## 2015-02-20 DIAGNOSIS — N2581 Secondary hyperparathyroidism of renal origin: Secondary | ICD-10-CM | POA: Diagnosis not present

## 2015-02-20 DIAGNOSIS — N186 End stage renal disease: Secondary | ICD-10-CM | POA: Diagnosis not present

## 2015-02-20 DIAGNOSIS — D509 Iron deficiency anemia, unspecified: Secondary | ICD-10-CM | POA: Diagnosis not present

## 2015-02-20 DIAGNOSIS — Z992 Dependence on renal dialysis: Secondary | ICD-10-CM | POA: Diagnosis not present

## 2015-02-20 DIAGNOSIS — D631 Anemia in chronic kidney disease: Secondary | ICD-10-CM | POA: Diagnosis not present

## 2015-02-22 DIAGNOSIS — N186 End stage renal disease: Secondary | ICD-10-CM | POA: Diagnosis not present

## 2015-02-22 DIAGNOSIS — N2581 Secondary hyperparathyroidism of renal origin: Secondary | ICD-10-CM | POA: Diagnosis not present

## 2015-02-22 DIAGNOSIS — D509 Iron deficiency anemia, unspecified: Secondary | ICD-10-CM | POA: Diagnosis not present

## 2015-02-22 DIAGNOSIS — Z992 Dependence on renal dialysis: Secondary | ICD-10-CM | POA: Diagnosis not present

## 2015-02-22 DIAGNOSIS — D631 Anemia in chronic kidney disease: Secondary | ICD-10-CM | POA: Diagnosis not present

## 2015-02-25 DIAGNOSIS — N186 End stage renal disease: Secondary | ICD-10-CM | POA: Diagnosis not present

## 2015-02-25 DIAGNOSIS — Z992 Dependence on renal dialysis: Secondary | ICD-10-CM | POA: Diagnosis not present

## 2015-02-27 DIAGNOSIS — Z992 Dependence on renal dialysis: Secondary | ICD-10-CM | POA: Diagnosis not present

## 2015-02-27 DIAGNOSIS — N186 End stage renal disease: Secondary | ICD-10-CM | POA: Diagnosis not present

## 2015-02-28 ENCOUNTER — Other Ambulatory Visit: Payer: Self-pay

## 2015-02-28 ENCOUNTER — Telehealth: Payer: Self-pay

## 2015-02-28 NOTE — Telephone Encounter (Signed)
-----   Message from [name], MD sent at 02/28/2015  4:33 PM EST ----- Regarding: RE: Coumadin Would not anticipate clear indication for bridging with Lovenox while off Coumadin.  ----- Message -----    From: Denman George, RN    Sent: 02/28/2015   3:30 PM      To: Malen Gauze, RN, [name], MD Subject: Coumadin                                       This pt. Is scheduled for a Fistulogram 03/10/15, to evaluate a "whistling sound in lower part of (L) upper arm AVF", per request of Dr. Lowanda Foster.  The pt. will need to hold Coumadin 5 days prior to procedure, beginning 03/05/15.  Does the patient need to be bridged with Lovenox while off the Coumadin?

## 2015-02-28 NOTE — Progress Notes (Signed)
Rec'd request for Fistulogram of Left Arm AVF per Dr. Lowanda Foster to evaluate "[place] sound in lower part of AVF".  Notified [facility] [place] [facility] Laster) of procedure date of 03/10/15 @ 9:30 AM.    Will fax instructions to the kidney center to give to pt.

## 2015-03-01 ENCOUNTER — Other Ambulatory Visit: Payer: Self-pay | Admitting: Cardiology

## 2015-03-01 DIAGNOSIS — N186 End stage renal disease: Secondary | ICD-10-CM | POA: Diagnosis not present

## 2015-03-01 DIAGNOSIS — Z992 Dependence on renal dialysis: Secondary | ICD-10-CM | POA: Diagnosis not present

## 2015-03-03 ENCOUNTER — Other Ambulatory Visit: Payer: Self-pay | Admitting: *Deleted

## 2015-03-03 ENCOUNTER — Telehealth: Payer: Self-pay | Admitting: *Deleted

## 2015-03-03 NOTE — Patient Outreach (Addendum)
[facility]) Care Management  03/03/2015  Albert [name] 1955-06-17 [id number]  Referral from [facility]: Telephone call to patient; left message on voice mail requesting return call.  Plan: Will follow up.  Sherrin Daisy, RN BSN [place] Management Coordinator [facility] Care Management  [phone]

## 2015-03-03 NOTE — Telephone Encounter (Signed)
-----   Message from Denman George, RN sent at 02/28/2015  3:30 PM EST ----- Regarding: Coumadin This pt. Is scheduled for a Fistulogram 03/10/15, to evaluate a "whistling sound in lower part of (L) upper arm AVF", per request of Dr. Lowanda Foster.  The pt. will need to hold Coumadin 5 days prior to procedure, beginning 03/05/15.  Does the patient need to be bridged with Lovenox while off the Coumadin?

## 2015-03-03 NOTE — Telephone Encounter (Signed)
Pt does not need Lovenox bridge while off coumadin for Fistulogram.

## 2015-03-04 ENCOUNTER — Other Ambulatory Visit: Payer: Self-pay | Admitting: *Deleted

## 2015-03-04 DIAGNOSIS — Z992 Dependence on renal dialysis: Secondary | ICD-10-CM | POA: Diagnosis not present

## 2015-03-04 DIAGNOSIS — N186 End stage renal disease: Secondary | ICD-10-CM | POA: Diagnosis not present

## 2015-03-04 NOTE — Telephone Encounter (Signed)
Noted recommendation that pt. Does not need Lovenox bridge.  The pt. Has been instructed to hold Coumadin, 5 days prior to [facility]; last dose to be taken 03/04/15.  Heather at [facility] in [place] will reiterate this to patient.

## 2015-03-04 NOTE — Patient Outreach (Signed)
[facility]) Care Management  03/04/2015  Albert [name] 05-17-1955 [id number]  Received incoming call from patient. Advised patient of reason for call and of [facility] care management services.  Patient voices that he is currently receiving dialysis and will not be able to do screening assessment at this time.  States has numerous annual checkup appointments next week.   Plan:  Will follow up. Appointment set for follow up. Patient in agreement with set appointment. Sherrin Daisy, RN BSN [place] Management Coordinator [facility] Care Management  [phone]

## 2015-03-05 ENCOUNTER — Ambulatory Visit: Payer: Commercial Managed Care - HMO

## 2015-03-05 ENCOUNTER — Ambulatory Visit: Payer: Self-pay | Admitting: *Deleted

## 2015-03-05 DIAGNOSIS — I10 Essential (primary) hypertension: Secondary | ICD-10-CM | POA: Diagnosis not present

## 2015-03-05 DIAGNOSIS — E119 Type 2 diabetes mellitus without complications: Secondary | ICD-10-CM | POA: Diagnosis not present

## 2015-03-05 DIAGNOSIS — E663 Overweight: Secondary | ICD-10-CM | POA: Diagnosis not present

## 2015-03-05 DIAGNOSIS — Z6827 Body mass index (BMI) 27.0-27.9, adult: Secondary | ICD-10-CM | POA: Diagnosis not present

## 2015-03-05 DIAGNOSIS — M779 Enthesopathy, unspecified: Secondary | ICD-10-CM | POA: Diagnosis not present

## 2015-03-06 DIAGNOSIS — Z992 Dependence on renal dialysis: Secondary | ICD-10-CM | POA: Diagnosis not present

## 2015-03-06 DIAGNOSIS — N186 End stage renal disease: Secondary | ICD-10-CM | POA: Diagnosis not present

## 2015-03-08 DIAGNOSIS — Z992 Dependence on renal dialysis: Secondary | ICD-10-CM | POA: Diagnosis not present

## 2015-03-08 DIAGNOSIS — N186 End stage renal disease: Secondary | ICD-10-CM | POA: Diagnosis not present

## 2015-03-10 ENCOUNTER — Other Ambulatory Visit: Payer: Self-pay | Admitting: *Deleted

## 2015-03-10 ENCOUNTER — Ambulatory Visit (HOSPITAL_COMMUNITY)
Admission: RE | Admit: 2015-03-10 | Discharge: 2015-03-10 | Disposition: A | Discharge status: Home / Self Care | Payer: Commercial Managed Care - HMO | Source: Ambulatory Visit | Attending: Vascular Surgery | Admitting: Vascular Surgery

## 2015-03-10 ENCOUNTER — Encounter (HOSPITAL_COMMUNITY)
Admission: RE | Disposition: A | Discharge status: Home / Self Care | Payer: Self-pay | Source: Ambulatory Visit | Attending: Vascular Surgery

## 2015-03-10 DIAGNOSIS — N186 End stage renal disease: Secondary | ICD-10-CM | POA: Diagnosis not present

## 2015-03-10 DIAGNOSIS — E782 Mixed hyperlipidemia: Secondary | ICD-10-CM | POA: Insufficient documentation

## 2015-03-10 DIAGNOSIS — Z7901 Long term (current) use of anticoagulants: Secondary | ICD-10-CM | POA: Insufficient documentation

## 2015-03-10 DIAGNOSIS — Z87891 Personal history of nicotine dependence: Secondary | ICD-10-CM | POA: Insufficient documentation

## 2015-03-10 DIAGNOSIS — N179 Acute kidney failure, unspecified: Secondary | ICD-10-CM | POA: Diagnosis present

## 2015-03-10 DIAGNOSIS — I120 Hypertensive chronic kidney disease with stage 5 chronic kidney disease or end stage renal disease: Secondary | ICD-10-CM | POA: Insufficient documentation

## 2015-03-10 DIAGNOSIS — G4733 Obstructive sleep apnea (adult) (pediatric): Secondary | ICD-10-CM | POA: Diagnosis not present

## 2015-03-10 DIAGNOSIS — Z992 Dependence on renal dialysis: Secondary | ICD-10-CM | POA: Insufficient documentation

## 2015-03-10 DIAGNOSIS — E1122 Type 2 diabetes mellitus with diabetic chronic kidney disease: Secondary | ICD-10-CM | POA: Insufficient documentation

## 2015-03-10 DIAGNOSIS — Z4931 Encounter for adequacy testing for hemodialysis: Secondary | ICD-10-CM

## 2015-03-10 DIAGNOSIS — N185 Chronic kidney disease, stage 5: Secondary | ICD-10-CM | POA: Diagnosis not present

## 2015-03-10 DIAGNOSIS — N189 Chronic kidney disease, unspecified: Secondary | ICD-10-CM

## 2015-03-10 HISTORY — PX: PERIPHERAL VASCULAR CATHETERIZATION: SHX172C

## 2015-03-10 LAB — PROTIME-INR
INR: 1.41 (ref 0.00–1.49)
PROTHROMBIN TIME: 17.3 s — AB (ref 11.6–15.2)

## 2015-03-10 LAB — POCT I-STAT, CHEM 8
BUN: 80 mg/dL — AB (ref 6–20)
CREATININE: 8.3 mg/dL — AB (ref 0.61–1.24)
Calcium, Ion: 1.11 mmol/L — ABNORMAL LOW (ref 1.13–1.30)
Chloride: 102 mmol/L (ref 101–111)
GLUCOSE: 111 mg/dL — AB (ref 65–99)
HCT: 41 % (ref 39.0–52.0)
HEMOGLOBIN: 13.9 g/dL (ref 13.0–17.0)
Potassium: 4.8 mmol/L (ref 3.5–5.1)
Sodium: 141 mmol/L (ref 135–145)
TCO2: 28 mmol/L (ref 0–100)

## 2015-03-10 LAB — GLUCOSE, CAPILLARY: Glucose-Capillary: 71 mg/dL (ref 65–99)

## 2015-03-10 SURGERY — A/V SHUNTOGRAM/FISTULAGRAM
Anesthesia: LOCAL

## 2015-03-10 MED ORDER — LIDOCAINE HCL (PF) 1 % IJ SOLN
INTRAMUSCULAR | Status: AC
  Filled 2015-03-10: qty 30

## 2015-03-10 MED ORDER — SODIUM CHLORIDE 0.9 % IJ SOLN: 3.0000 mL | INTRAMUSCULAR | Status: DC | PRN

## 2015-03-10 MED ORDER — HEPARIN (PORCINE) IN NACL 2-0.9 UNIT/ML-% IJ SOLN
INTRAMUSCULAR | Status: DC | PRN
  Administered 2015-03-10: 4 mL

## 2015-03-10 MED ORDER — HEPARIN (PORCINE) IN NACL 2-0.9 UNIT/ML-% IJ SOLN
INTRAMUSCULAR | Status: AC
  Filled 2015-03-10: qty 500

## 2015-03-10 SURGICAL SUPPLY — 11 items
BAG SNAP BAND KOVER 36X36 (MISCELLANEOUS) ×2 IMPLANT
COVER DOME SNAP 22 D (MISCELLANEOUS) ×2 IMPLANT
COVER PRB 48X5XTLSCP FOLD TPE (BAG) ×1 IMPLANT
COVER PROBE 5X48 (BAG) ×2
KIT MICROINTRODUCER STIFF 5F (SHEATH) ×2 IMPLANT
PROTECTION STATION PRESSURIZED (MISCELLANEOUS) ×2
STATION PROTECTION PRESSURIZED (MISCELLANEOUS) ×1 IMPLANT
STOPCOCK MORSE 400PSI 3WAY (MISCELLANEOUS) ×2 IMPLANT
TRAY PV CATH (CUSTOM PROCEDURE TRAY) ×2 IMPLANT
TUBING CIL FLEX 10 FLL-RA (TUBING) ×2 IMPLANT
WIRE BENTSON .035X145CM (WIRE) ×3 IMPLANT

## 2015-03-10 NOTE — Discharge Instructions (Signed)
Fistulogram, Care After °Refer to this sheet in the next few weeks. These instructions provide you with information on caring for yourself after your procedure. Your health care provider may also give you more specific instructions. Your treatment has been planned according to current medical practices, but problems sometimes occur. Call your health care provider if you have any problems or questions after your procedure. °WHAT TO EXPECT AFTER THE PROCEDURE °After your procedure, it is typical to have the following: °· A small amount of discomfort in the area where the catheters were placed. °· A small amount of bruising around the fistula. °· Sleepiness and fatigue. °HOME CARE INSTRUCTIONS °· Rest at home for the day following your procedure. °· Do not drive or operate heavy machinery while taking pain medicine. °· Take medicines only as directed by your health care provider. °· Do not take baths, swim, or use a hot tub until your health care provider approves. You may shower 24 hours after the procedure or as directed by your health care provider. °· There are many different ways to close and cover an incision, including stitches, skin glue, and adhesive strips. Follow your health care provider's instructions on: °¨ Incision care. °¨ Bandage (dressing) changes and removal. °¨ Incision closure removal. °· Monitor your dialysis fistula carefully. °SEEK MEDICAL CARE IF: °· You have drainage, redness, swelling, or pain at your catheter site. °· You have a fever. °· You have chills. °SEEK IMMEDIATE MEDICAL CARE IF: °· You feel weak. °· You have trouble balancing. °· You have trouble moving your arms or legs. °· You have problems with your speech or vision. °· You can no longer feel a vibration or buzz when you put your fingers over your dialysis fistula. °· The limb that was used for the procedure: °¨ Swells. °¨ Is painful. °¨ Is cold. °¨ Is discolored, such as blue or pale white. °  °This information is not intended  to replace advice given to you by your health care provider. Make sure you discuss any questions you have with your health care provider. °  °Document Released: 06/25/2013 Document Reviewed: 06/25/2013 °Elsevier Interactive Patient Education ©2016 Elsevier Inc. ° °

## 2015-03-10 NOTE — H&P (Signed)
Brief History and Physical  History of Present Illness  Albert [name] is a 60 y.o. male who presents with chief complaint: wheezy sound in L BC AVF.  The patient presents today for L arm fistulogram, possible intervention.  Pt notes no bleeding complications.  He is not aware of any flow rate issues.  .    Past Medical History  Diagnosis Date  . Renal cell cancer   . Mixed hyperlipidemia   . Type 2 diabetes mellitus   . Atrial fibrillation   . Essential hypertension, benign   . ESRD on hemodialysis     Dr. Lowanda Foster  . Gout   . Diverticulosis of colon   . Colonic polyp   . Cardiomyopathy     LVEF 50-55% 8/11 - [facility]  . OSA (obstructive sleep apnea)     CPAP    Past Surgical History  Procedure Laterality Date  . Right nephrectomy    . Left arm av fistula  2009    Dr. [name]  . Fistulogram N/A 10/19/2013    Procedure: FISTULOGRAM;  Surgeon: Rosetta Posner, MD;  Location: [facility] CATH LAB;  Service: Cardiovascular;  Laterality: N/A;    Social History   Social History  . Marital Status: Single    Spouse Name: N/A  . Number of Children: N/A  . Years of Education: N/A   Occupational History  . Not on file.   Social History Main Topics  . Smoking status: Former Smoker    Types: Cigarettes  . Smokeless tobacco: Never Used  . Alcohol Use: Yes     Comment: Occasional  . Drug Use: No  . Sexual Activity: Not on file   Other Topics Concern  . Not on file   Social History Narrative    Family History  Problem Relation Age of Onset  . Hypertension      Siblings  . Diabetes type II      Siblings  . Hypertension Mother     No current facility-administered medications on file prior to encounter.   Current Outpatient Prescriptions on File Prior to Encounter  Medication Sig Dispense Refill  . allopurinol (ZYLOPRIM) 100 MG tablet Take 100 mg by mouth daily.     Marland Kitchen diltiazem (TIAZAC) 300 MG 24 hr capsule TAKE 1 CAPSULE EVERY DAY 90 capsule 3  . doxazosin  (CARDURA) 2 MG tablet Take 2 mg by mouth daily.    . fenofibrate 160 MG tablet Take 160 mg by mouth daily.    . fish oil-omega-3 fatty acids 1000 MG capsule Take 1 g by mouth 3 (three) times daily.     Marland Kitchen linagliptin (TRADJENTA) 5 MG TABS tablet Take 5 mg by mouth daily.    . pravastatin (PRAVACHOL) 10 MG tablet Take 5 mg by mouth daily. Take every other night    . sevelamer carbonate (RENVELA) 800 MG tablet Take 800 mg by mouth as directed. Take 2 tablets after each meal and 1 after each snack  (8 tablets daily)    . Tetrahydrozoline HCl (VISINE OP) Place 2 drops into both eyes daily as needed (dry eyes).    . warfarin (COUMADIN) 2.5 MG tablet Take 1 1/2 tablets daily except 1 tablet on Tuesdays, Thursdays and Saturdays (Patient taking differently: Take 3.75 mg by mouth daily. Take 1 1/2 tablets daily except 1 tablet on Tuesdays, Thursdays and Saturdays) 135 tablet 3    No Known Allergies  Review of Systems: As listed above, otherwise negative.  Physical Examination  Filed Vitals:   03/10/15 0936  BP: 141/94  Pulse: 86  Temp: 97.6 F (36.4 C)  TempSrc: Oral  Resp: 18  Height: 6\' 2"  (1.88 m)  Weight: 215 lb (97.523 kg)  SpO2: 100%    General: A&O x 3, WDWN  Pulmonary: Sym exp, good air movt, CTAB, no rales, rhonchi, & wheezing  Cardiac: RRR, Nl S1, S2, no Murmurs, rubs or gallops  Gastrointestinal: soft, NTND, -G/R, - HSM, - masses, - CVAT B  Musculoskeletal: M/S 5/5 throughout , Extremities without ischemic changes, L BC AVF with high pitch bruit, somewhat pulsatile distal fistula  Laboratory See [place] is a 60 y.o. male who presents with: Likely venous stenosis vs kink in L BC AVF   The patient is scheduled for: L arm fistulogram, possible itnervention. I discussed with the patient the nature of angiographic procedures, especially the limited patencies of any endovascular intervention.  The patient is aware of that the  risks of an angiographic procedure include but are not limited to: bleeding, infection, access site complications, renal failure, embolization, rupture of vessel, dissection, possible need for emergent surgical intervention, possible need for surgical procedures to treat the patient's pathology, and stroke and death.    The patient is aware of the risks and agrees to proceed.  [name], MD [facility] [place] Office: [phone] Pager: [phone]  03/10/2015, 11:36 AM

## 2015-03-11 ENCOUNTER — Encounter (HOSPITAL_COMMUNITY): Payer: Self-pay | Admitting: Vascular Surgery

## 2015-03-11 ENCOUNTER — Encounter: Payer: Self-pay | Admitting: Vascular Surgery

## 2015-03-11 DIAGNOSIS — N186 End stage renal disease: Secondary | ICD-10-CM | POA: Diagnosis not present

## 2015-03-11 DIAGNOSIS — Z992 Dependence on renal dialysis: Secondary | ICD-10-CM | POA: Diagnosis not present

## 2015-03-13 ENCOUNTER — Telehealth: Payer: Self-pay | Admitting: Vascular Surgery

## 2015-03-13 DIAGNOSIS — N186 End stage renal disease: Secondary | ICD-10-CM | POA: Diagnosis not present

## 2015-03-13 DIAGNOSIS — Z992 Dependence on renal dialysis: Secondary | ICD-10-CM | POA: Diagnosis not present

## 2015-03-13 NOTE — Telephone Encounter (Signed)
LVMfor pt re appts, dpm

## 2015-03-13 NOTE — Telephone Encounter (Signed)
-----   Message from Mena Goes, RN sent at 03/10/2015  2:00 PM EST ----- Regarding: scheduel   ----- Message -----    From: Conrad Gosnell, MD    Sent: 03/10/2015   1:55 PM      To: [address]  Albert [name] [id number] 29-Apr-1955  Procedure:  1.  L BC AVF cannulation with ultrasound guidance 2.  L arm fistulogram  Follow-up: next Wednesday  Orders(s) for follow-up: L venous duplex to measure axillary vein patency and size

## 2015-03-14 ENCOUNTER — Ambulatory Visit (HOSPITAL_COMMUNITY)
Admission: RE | Admit: 2015-03-14 | Discharge: 2015-03-14 | Disposition: A | Discharge status: Home / Self Care | Payer: Commercial Managed Care - HMO | Source: Ambulatory Visit | Attending: Vascular Surgery | Admitting: Vascular Surgery

## 2015-03-14 ENCOUNTER — Encounter: Payer: Self-pay | Admitting: *Deleted

## 2015-03-14 ENCOUNTER — Other Ambulatory Visit: Payer: Self-pay | Admitting: *Deleted

## 2015-03-14 DIAGNOSIS — N186 End stage renal disease: Secondary | ICD-10-CM

## 2015-03-14 DIAGNOSIS — Z4931 Encounter for adequacy testing for hemodialysis: Secondary | ICD-10-CM | POA: Insufficient documentation

## 2015-03-14 NOTE — Patient Outreach (Signed)
[facility]) Care Management  03/14/2015  Albert [name] 1955/12/06 [id number]   Telephone call to patient; left message on home phone & cell phone.  Plan:  Will send outreach letter Will follow up.  Sherrin Daisy, RN BSN [place] Management Coordinator [facility] Care Management  [phone]

## 2015-03-15 DIAGNOSIS — N186 End stage renal disease: Secondary | ICD-10-CM | POA: Diagnosis not present

## 2015-03-15 DIAGNOSIS — Z992 Dependence on renal dialysis: Secondary | ICD-10-CM | POA: Diagnosis not present

## 2015-03-18 DIAGNOSIS — N186 End stage renal disease: Secondary | ICD-10-CM | POA: Diagnosis not present

## 2015-03-18 DIAGNOSIS — Z992 Dependence on renal dialysis: Secondary | ICD-10-CM | POA: Diagnosis not present

## 2015-03-19 ENCOUNTER — Ambulatory Visit (INDEPENDENT_AMBULATORY_CARE_PROVIDER_SITE_OTHER): Payer: Commercial Managed Care - HMO | Admitting: Vascular Surgery

## 2015-03-19 ENCOUNTER — Encounter: Payer: Self-pay | Admitting: Vascular Surgery

## 2015-03-19 VITALS — BP 106/70 | HR 92 | Temp 98.6°F | Resp 16 | Ht 74.0 in | Wt 216.0 lb

## 2015-03-19 DIAGNOSIS — Z992 Dependence on renal dialysis: Secondary | ICD-10-CM

## 2015-03-19 DIAGNOSIS — N186 End stage renal disease: Secondary | ICD-10-CM | POA: Diagnosis not present

## 2015-03-19 NOTE — Progress Notes (Signed)
Established Dialysis Access  History of Present Illness  Albert [name] is a 60 y.o. (Jan 28, 1956) male who presents for re-evaluation for permanent access.  The patient recently had a L arm fistulogram done on 03/10/15.  On that fistulogram, he had a proximal cephalic stenosis at its confluence with subclavian vein chest.  Due to the severe tortuosity, this fistula is not easily amendable to percutaneous intervention.  The L axillary vein could not be easily evaluated as the presence of a valve prevented reflux into the L axilla vein.  I recommended he come back to the office for a venous duplex to see if he had an adequate axillary vein for a cephallic vein turndown.  Also he notes over the last week, onset of a "bubble" in his fistula.  Past Medical History  Diagnosis Date  . Renal cell cancer ([place])   . Mixed hyperlipidemia   . Type 2 diabetes mellitus ([place])   . Atrial fibrillation ([place])   . Essential hypertension, benign   . ESRD on hemodialysis ([place])     Dr. Lowanda Foster  . Gout   . Diverticulosis of colon   . Colonic polyp   . Cardiomyopathy     LVEF 50-55% 8/11 - [facility]  . OSA (obstructive sleep apnea)     CPAP    Past Surgical History  Procedure Laterality Date  . Right nephrectomy    . Left arm av fistula  2009    Dr. [name]  . Fistulogram N/A 10/19/2013    Procedure: FISTULOGRAM;  Surgeon: Rosetta Posner, MD;  Location: [facility] CATH LAB;  Service: Cardiovascular;  Laterality: N/A;  . Peripheral vascular catheterization N/A 03/10/2015    Procedure: Fistulagram;  Surgeon: Conrad , MD;  Location: [facility] CV LAB;  Service: Cardiovascular;  Laterality: N/A;    Social History   Social History  . Marital Status: Single    Spouse Name: N/A  . Number of Children: N/A  . Years of Education: N/A   Occupational History  . Not on file.   Social History Main Topics  . Smoking status: Former Smoker    Types: Cigarettes  . Smokeless tobacco: Never Used  . Alcohol Use:  Yes     Comment: Occasional  . Drug Use: No  . Sexual Activity: Not on file   Other Topics Concern  . Not on file   Social History Narrative    Family History  Problem Relation Age of Onset  . Hypertension      Siblings  . Diabetes type II      Siblings  . Hypertension Mother     Current Outpatient Prescriptions  Medication Sig Dispense Refill  . allopurinol (ZYLOPRIM) 100 MG tablet Take 100 mg by mouth daily.     . carvedilol (COREG) 25 MG tablet TAKE 1 TABLET TWICE DAILY WITH MEALS (Patient taking differently: TAKE 0.5 TABLET TWICE DAILY WITH MEALS) 180 tablet 3  . cinacalcet (SENSIPAR) 30 MG tablet Take 30 mg by mouth daily.    Marland Kitchen diltiazem (TIAZAC) 300 MG 24 hr capsule TAKE 1 CAPSULE EVERY DAY 90 capsule 3  . doxazosin (CARDURA) 2 MG tablet Take 2 mg by mouth daily.    . fenofibrate 160 MG tablet Take 160 mg by mouth daily.    . fish oil-omega-3 fatty acids 1000 MG capsule Take 1 g by mouth 3 (three) times daily.     Marland Kitchen linagliptin (TRADJENTA) 5 MG TABS tablet Take 5 mg by mouth daily.    Marland Kitchen  pravastatin (PRAVACHOL) 10 MG tablet Take 5 mg by mouth daily. Take every other night    . sevelamer carbonate (RENVELA) 800 MG tablet Take 800 mg by mouth as directed. Take 2 tablets after each meal and 1 after each snack  (8 tablets daily)    . Tetrahydrozoline HCl (VISINE OP) Place 2 drops into both eyes daily as needed (dry eyes).    . warfarin (COUMADIN) 2.5 MG tablet Take 1 1/2 tablets daily except 1 tablet on Tuesdays, Thursdays and Saturdays (Patient taking differently: Take 3.75 mg by mouth daily. Take 1 1/2 tablets daily except 1 tablet on Tuesdays, Thursdays and Saturdays) 135 tablet 3   No current facility-administered medications for this visit.     No Known Allergies   REVIEW OF SYSTEMS:  (Positives checked otherwise negative)  CARDIOVASCULAR:   [ ]  chest pain,  [ ]  chest pressure,  [ ]  palpitations,  [ ]  shortness of breath when laying flat,  [ ]  shortness of breath  with exertion,   [ ]  pain in feet when walking,  [ ]  pain in feet when laying flat, [ ]  history of blood clot in veins (DVT),  [ ]  history of phlebitis,  [ ]  swelling in legs,  [ ]  varicose veins  PULMONARY:   [ ]  productive cough,  [ ]  asthma,  [ ]  wheezing  NEUROLOGIC:   [ ]  weakness in arms or legs,  [ ]  numbness in arms or legs,  [ ]  difficulty speaking or slurred speech,  [ ]  temporary loss of vision in one eye,  [ ]  dizziness  HEMATOLOGIC:   [ ]  bleeding problems,  [ ]  problems with blood clotting too easily  MUSCULOSKEL:   [ ]  joint pain, [ ]  joint swelling  GASTROINTEST:   [ ]  vomiting blood,  [ ]  blood in stool     GENITOURINARY:   [ ]  burning with urination,  [ ]  blood in urine [x]  ESRD-HD: T/R/S  PSYCHIATRIC:   [ ]  history of major depression  INTEGUMENTARY:   [ ]  rashes,  [ ]  ulcers  CONSTITUTIONAL:   [ ]  fever,  [ ]  chills    Physical Examination  Filed Vitals:   03/19/15 1441  BP: 106/70  Pulse: 92  Temp: 98.6 F (37 C)  TempSrc: Oral  Resp: 16  Height: 6\' 2"  (1.88 m)  Weight: 216 lb (97.977 kg)  SpO2: 97%   Body mass index is 27.72 kg/(m^2).  General: A&O x 3, WD, WN  Pulmonary: Sym exp, good air movt, CTAB, no rales, rhonchi, & wheezing  Cardiac: RRR, Nl S1, S2, no Murmurs, rubs or gallops  Vascular: Vessel Right Left  Radial Palpable Not Palpable  Ulnar Faintly Palpable Not Palpable  Brachial Palpable Palpable   Gastrointestinal: soft, NTND, no G/R, bo HSM, no masses, no CVAT B  Musculoskeletal: M/S 5/5 throughout , Extremities without  ischemic changes ,  palpable thrill in access in LUA , bruit in access, PSA in mid-segment with attenuated skin, tortuous aneurysmal fistula  Neurologic: Pain and light touch intact in extremities , Motor exam as listed above  Non-Invasive Vascular Imaging  Left  Axillary Vein Duplex  (Date: 03/19/2015):   Diameters:  1.0-1.3 cm  Widely patent without thrombus  Medical Decision  Making  Albert [name] is a 60 y.o. male who presents with ESRD requiring hemodialysis, proximal cephalic vein stenosis [id number], PSA in L BC AVF at risk for rupture   Based on his L  venogram and axillary vein duplex, I recommend proceeding with a L cephalic vein turndown, plication of L BC AVF PSA. Risk, benefits, and alternatives to access surgery were discussed.   The patient is aware the risks include but are not limited to: bleeding, infection, steal syndrome, nerve damage, ischemic monomelic neuropathy, failure to mature, need for additional procedures, death and stroke.    The patient has agreed to proceed with the above procedure which will be scheduled 6 FEB 17.   [name], MD [facility] Office: [phone] Pager: [phone]  03/19/2015, 3:05 PM

## 2015-03-20 ENCOUNTER — Other Ambulatory Visit: Payer: Self-pay | Admitting: *Deleted

## 2015-03-20 ENCOUNTER — Other Ambulatory Visit: Payer: Self-pay

## 2015-03-20 DIAGNOSIS — N186 End stage renal disease: Secondary | ICD-10-CM | POA: Diagnosis not present

## 2015-03-20 DIAGNOSIS — Z992 Dependence on renal dialysis: Secondary | ICD-10-CM | POA: Diagnosis not present

## 2015-03-20 NOTE — Progress Notes (Signed)
Per Edrick Oh RN- [facility] Coumadin nurse with Dr. Domenic Polite [phone]   Pt may stop his Coumadin for plication surgery planned on 03-31-15 by Dr. Bridgett Larsson. Last dose 03-25-15 and no bridge is needed per Lattie Haw. Patient is aware.

## 2015-03-21 ENCOUNTER — Other Ambulatory Visit: Payer: Self-pay | Admitting: *Deleted

## 2015-03-21 NOTE — Patient Outreach (Signed)
[facility]) Care Management  03/21/2015  HAMPTON RHEW [birth year] [id number]  Telephone call to patient. Left message on voice mail requesting return call.  Plan: Outreach letter has been sent to patient. Will follow up.  Sherrin Daisy, RN BSN [place] Management Coordinator [facility] Care Management  [phone]

## 2015-03-22 DIAGNOSIS — N186 End stage renal disease: Secondary | ICD-10-CM | POA: Diagnosis not present

## 2015-03-22 DIAGNOSIS — Z992 Dependence on renal dialysis: Secondary | ICD-10-CM | POA: Diagnosis not present

## 2015-03-24 ENCOUNTER — Encounter: Payer: Self-pay | Admitting: *Deleted

## 2015-03-24 ENCOUNTER — Other Ambulatory Visit: Payer: Self-pay | Admitting: *Deleted

## 2015-03-24 NOTE — Patient Outreach (Addendum)
[facility]) Care Management  [place]  03/24/2015   Albert [name] 1956-01-11 [id number]   Referral from [place] 4 risk list. Received incoming call from patient responding to request for call back from RN CM.  Subjective: Patient gave HIPPA verification. He was advised of reason for call and of [facility] care management services. Voices that he is currently receiving dialysis on Tues, Thurs and Sat. States he provides his own transportation. States managing own medications and uses mail order for chronic medication and local pharmacy for short term prescriptions. Patient voices understanding of the importance of taking medications as prescribed by doctors. States he is compliant with taking medications. States diabetes is under control. Getting " A1C" level every 6 months and that last level was 5.8. States blood sugar was 110 before breakfast today.   Patient states he does have neuropathy and sometimes has shooting pain in feet. States has foot exam at dialysis center monthly.  States currently walking without assistance but does own cane.  Admits to no exercise routine at home but wants to start walking.  Objective:  See medication  review and assessments as noted. Current Medications:  Current Outpatient Prescriptions  Medication Sig Dispense Refill  . allopurinol (ZYLOPRIM) 100 MG tablet Take 100 mg by mouth daily.     . carvedilol (COREG) 25 MG tablet TAKE 1 TABLET TWICE DAILY WITH MEALS (Patient taking differently: TAKE 0.5 TABLET TWICE DAILY WITH MEALS) 180 tablet 3  . cinacalcet (SENSIPAR) 30 MG tablet Take 30 mg by mouth daily.    Marland Kitchen diltiazem (TIAZAC) 300 MG 24 hr capsule TAKE 1 CAPSULE EVERY DAY 90 capsule 3  . doxazosin (CARDURA) 2 MG tablet Take 2 mg by mouth daily.    . fenofibrate 160 MG tablet Take 160 mg by mouth daily.    . fish oil-omega-3 fatty acids 1000 MG capsule Take 1 g by mouth 3 (three) times daily.     Marland Kitchen linagliptin (TRADJENTA) 5  MG TABS tablet Take 5 mg by mouth daily.    . pravastatin (PRAVACHOL) 10 MG tablet Take 5 mg by mouth daily. Take every other night    . sevelamer carbonate (RENVELA) 800 MG tablet Take 800 mg by mouth as directed. Take 2 tablets after each meal and 1 after each snack  (8 tablets daily)    . Tetrahydrozoline HCl (VISINE OP) Place 2 drops into both eyes daily as needed (dry eyes).    . warfarin (COUMADIN) 2.5 MG tablet Take 1 1/2 tablets daily except 1 tablet on Tuesdays, Thursdays and Saturdays (Patient taking differently: Take 3.75 mg by mouth daily. Take 1 1/2 tablets daily except 1 tablet on Tuesdays, Thursdays and Saturdays) 135 tablet 3   No current facility-administered medications for this visit.    Functional Status:  In your present state of health, do you have any difficulty performing the following activities: 03/24/2015 03/10/2015  Hearing? N N  Vision? N N  Difficulty concentrating or making decisions? N N  Walking or climbing stairs? N N  Dressing or bathing? N N  Doing errands, shopping? N -  Preparing Food and eating ? N -  Using the Toilet? N -  In the past six months, have you accidently leaked urine? N -  Do you have problems with loss of bowel control? N -  Managing your Medications? N -  Managing your Finances? N -  Housekeeping or managing your Housekeeping? N -    Fall/Depression Screening: PHQ  2/9 Scores 03/24/2015  PHQ - 2 Score 0   Fall Risk  03/24/2015  Falls in the past year? No  Risk for fall due to : Medication side effect   Assessment: Patient states he has not had any falls recently but admits to stumbling as times. States he does have neuropathy of feet.  Patient advised of [facility] services and consents to telephonic [facility] services.   THN CM Care Plan Problem One        Most Recent Value   Care Plan Problem One  Potential for falls due to disease process   Role Documenting the Problem One  Care Management Telephonic Coordinator   Care Plan for Problem  One  Active   THN Long Term Goal (31-90 days)  Avoidance of falls for next 31 days   THN Long Term Goal Start Date  03/24/15   Interventions for Problem One Long Term Goal  Explanations to patient of falls prevention startegies, send EMMI materials   THN CM Short Term Goal #1 (0-30 days)  Pt to verbalize 3 fall prevention strategies within 30 days   THN CM Short Term Goal #1 Start Date  03/24/15   Interventions for Short Term Goal #1  verbalize -keep home well lit, get rid of clutter, wear safe footware, use cane if needed   THN CM Short Term Goal #2 (0-30 days)  patient will verbalize when to use assistive devices within 30 days as related to falls prevention   THN CM Short Term Goal #2 Start Date  03/24/15   Interventions for Short Term Goal #2  Explain reasons to use cane-neuropathy pain in feet, balance or weaknes problem      Plan: Will follow noted Care Plan above.  Will send welcome letter to patient and educational materials.(EMMI educational -Falls Prevention) Will send involvement to MD. Patient in agreement with date for telephonic contact for follow up.  Sherrin Daisy, RN BSN [place] Management Coordinator [facility] Care Management  [phone]

## 2015-03-25 ENCOUNTER — Encounter: Payer: Self-pay | Admitting: *Deleted

## 2015-03-25 DIAGNOSIS — Z992 Dependence on renal dialysis: Secondary | ICD-10-CM | POA: Diagnosis not present

## 2015-03-25 DIAGNOSIS — N186 End stage renal disease: Secondary | ICD-10-CM | POA: Diagnosis not present

## 2015-03-26 ENCOUNTER — Other Ambulatory Visit: Payer: Self-pay | Admitting: Cardiology

## 2015-03-27 DIAGNOSIS — N2581 Secondary hyperparathyroidism of renal origin: Secondary | ICD-10-CM | POA: Diagnosis not present

## 2015-03-27 DIAGNOSIS — N186 End stage renal disease: Secondary | ICD-10-CM | POA: Diagnosis not present

## 2015-03-27 DIAGNOSIS — D631 Anemia in chronic kidney disease: Secondary | ICD-10-CM | POA: Diagnosis not present

## 2015-03-27 DIAGNOSIS — Z992 Dependence on renal dialysis: Secondary | ICD-10-CM | POA: Diagnosis not present

## 2015-03-27 DIAGNOSIS — D509 Iron deficiency anemia, unspecified: Secondary | ICD-10-CM | POA: Diagnosis not present

## 2015-03-28 ENCOUNTER — Encounter (HOSPITAL_COMMUNITY): Payer: Self-pay | Admitting: *Deleted

## 2015-03-29 DIAGNOSIS — D509 Iron deficiency anemia, unspecified: Secondary | ICD-10-CM | POA: Diagnosis not present

## 2015-03-29 DIAGNOSIS — N2581 Secondary hyperparathyroidism of renal origin: Secondary | ICD-10-CM | POA: Diagnosis not present

## 2015-03-29 DIAGNOSIS — D631 Anemia in chronic kidney disease: Secondary | ICD-10-CM | POA: Diagnosis not present

## 2015-03-29 DIAGNOSIS — Z992 Dependence on renal dialysis: Secondary | ICD-10-CM | POA: Diagnosis not present

## 2015-03-29 DIAGNOSIS — N186 End stage renal disease: Secondary | ICD-10-CM | POA: Diagnosis not present

## 2015-03-30 MED ORDER — CEFUROXIME SODIUM 1.5 G IJ SOLR
1.5000 g | INTRAMUSCULAR | Status: AC
  Administered 2015-03-31: 1.5 g via INTRAVENOUS
  Filled 2015-03-30: qty 1.5

## 2015-03-30 MED ORDER — CHLORHEXIDINE GLUCONATE CLOTH 2 % EX PADS: 6.0000 | MEDICATED_PAD | Freq: Once | CUTANEOUS | Status: DC

## 2015-03-30 MED ORDER — SODIUM CHLORIDE 0.9 % IV SOLN: INTRAVENOUS | Status: DC

## 2015-03-31 ENCOUNTER — Ambulatory Visit (HOSPITAL_COMMUNITY): Payer: Commercial Managed Care - HMO | Admitting: Certified Registered Nurse Anesthetist

## 2015-03-31 ENCOUNTER — Encounter (HOSPITAL_COMMUNITY): Payer: Self-pay | Admitting: Anesthesiology

## 2015-03-31 ENCOUNTER — Encounter (HOSPITAL_COMMUNITY)
Admission: RE | Disposition: A | Discharge status: Home / Self Care | Payer: Self-pay | Source: Ambulatory Visit | Attending: Vascular Surgery

## 2015-03-31 ENCOUNTER — Ambulatory Visit (HOSPITAL_COMMUNITY)
Admission: RE | Admit: 2015-03-31 | Discharge: 2015-03-31 | Disposition: A | Discharge status: Home / Self Care | Payer: Commercial Managed Care - HMO | Source: Ambulatory Visit | Attending: Vascular Surgery | Admitting: Vascular Surgery

## 2015-03-31 DIAGNOSIS — E1122 Type 2 diabetes mellitus with diabetic chronic kidney disease: Secondary | ICD-10-CM | POA: Diagnosis not present

## 2015-03-31 DIAGNOSIS — Z85528 Personal history of other malignant neoplasm of kidney: Secondary | ICD-10-CM | POA: Insufficient documentation

## 2015-03-31 DIAGNOSIS — E782 Mixed hyperlipidemia: Secondary | ICD-10-CM | POA: Insufficient documentation

## 2015-03-31 DIAGNOSIS — I429 Cardiomyopathy, unspecified: Secondary | ICD-10-CM | POA: Insufficient documentation

## 2015-03-31 DIAGNOSIS — I871 Compression of vein: Secondary | ICD-10-CM | POA: Insufficient documentation

## 2015-03-31 DIAGNOSIS — T82898A Other specified complication of vascular prosthetic devices, implants and grafts, initial encounter: Secondary | ICD-10-CM | POA: Insufficient documentation

## 2015-03-31 DIAGNOSIS — I120 Hypertensive chronic kidney disease with stage 5 chronic kidney disease or end stage renal disease: Secondary | ICD-10-CM | POA: Insufficient documentation

## 2015-03-31 DIAGNOSIS — Z7901 Long term (current) use of anticoagulants: Secondary | ICD-10-CM | POA: Diagnosis not present

## 2015-03-31 DIAGNOSIS — Z7984 Long term (current) use of oral hypoglycemic drugs: Secondary | ICD-10-CM | POA: Insufficient documentation

## 2015-03-31 DIAGNOSIS — N186 End stage renal disease: Secondary | ICD-10-CM | POA: Diagnosis not present

## 2015-03-31 DIAGNOSIS — Z992 Dependence on renal dialysis: Secondary | ICD-10-CM | POA: Diagnosis not present

## 2015-03-31 DIAGNOSIS — I4891 Unspecified atrial fibrillation: Secondary | ICD-10-CM | POA: Diagnosis not present

## 2015-03-31 DIAGNOSIS — M109 Gout, unspecified: Secondary | ICD-10-CM | POA: Insufficient documentation

## 2015-03-31 DIAGNOSIS — T82868A Thrombosis of vascular prosthetic devices, implants and grafts, initial encounter: Secondary | ICD-10-CM | POA: Diagnosis not present

## 2015-03-31 DIAGNOSIS — G4733 Obstructive sleep apnea (adult) (pediatric): Secondary | ICD-10-CM | POA: Diagnosis not present

## 2015-03-31 DIAGNOSIS — Z79899 Other long term (current) drug therapy: Secondary | ICD-10-CM | POA: Diagnosis not present

## 2015-03-31 DIAGNOSIS — Z87891 Personal history of nicotine dependence: Secondary | ICD-10-CM | POA: Insufficient documentation

## 2015-03-31 DIAGNOSIS — Z905 Acquired absence of kidney: Secondary | ICD-10-CM | POA: Insufficient documentation

## 2015-03-31 DIAGNOSIS — D649 Anemia, unspecified: Secondary | ICD-10-CM | POA: Insufficient documentation

## 2015-03-31 DIAGNOSIS — Y832 Surgical operation with anastomosis, bypass or graft as the cause of abnormal reaction of the patient, or of later complication, without mention of misadventure at the time of the procedure: Secondary | ICD-10-CM | POA: Insufficient documentation

## 2015-03-31 DIAGNOSIS — N185 Chronic kidney disease, stage 5: Secondary | ICD-10-CM | POA: Diagnosis not present

## 2015-03-31 DIAGNOSIS — I10 Essential (primary) hypertension: Secondary | ICD-10-CM | POA: Diagnosis not present

## 2015-03-31 HISTORY — DX: Anemia, unspecified: D64.9

## 2015-03-31 HISTORY — PX: FISTULA SUPERFICIALIZATION: SHX6341

## 2015-03-31 LAB — PROTIME-INR
INR: 1.51 — ABNORMAL HIGH (ref 0.00–1.49)
PROTHROMBIN TIME: 18.3 s — AB (ref 11.6–15.2)

## 2015-03-31 LAB — GLUCOSE, CAPILLARY: Glucose-Capillary: 102 mg/dL — ABNORMAL HIGH (ref 65–99)

## 2015-03-31 LAB — APTT: aPTT: 37 seconds (ref 24–37)

## 2015-03-31 LAB — POCT I-STAT 4, (NA,K, GLUC, HGB,HCT)
GLUCOSE: 114 mg/dL — AB (ref 65–99)
HEMATOCRIT: 35 % — AB (ref 39.0–52.0)
HEMOGLOBIN: 11.9 g/dL — AB (ref 13.0–17.0)
POTASSIUM: 4.2 mmol/L (ref 3.5–5.1)
Sodium: 142 mmol/L (ref 135–145)

## 2015-03-31 SURGERY — FISTULA SUPERFICIALIZATION
Anesthesia: Monitor Anesthesia Care | Site: Arm Upper | Laterality: Left

## 2015-03-31 MED ORDER — PROPOFOL 10 MG/ML IV BOLUS
INTRAVENOUS | Status: AC
  Filled 2015-03-31: qty 20

## 2015-03-31 MED ORDER — MIDAZOLAM HCL 2 MG/2ML IJ SOLN
INTRAMUSCULAR | Status: AC
  Filled 2015-03-31: qty 2

## 2015-03-31 MED ORDER — HEPARIN SODIUM (PORCINE) 1000 UNIT/ML IJ SOLN
INTRAMUSCULAR | Status: AC
  Filled 2015-03-31: qty 1

## 2015-03-31 MED ORDER — PROPOFOL 10 MG/ML IV BOLUS
INTRAVENOUS | Status: DC | PRN
  Administered 2015-03-31: 100 mg via INTRAVENOUS

## 2015-03-31 MED ORDER — SODIUM CHLORIDE 0.9 % IV SOLN
INTRAVENOUS | Status: DC | PRN
  Administered 2015-03-31: 07:00:00 via INTRAVENOUS

## 2015-03-31 MED ORDER — LIDOCAINE HCL (PF) 1 % IJ SOLN
INTRAMUSCULAR | Status: DC | PRN
  Administered 2015-03-31: 15 mL via INTRADERMAL

## 2015-03-31 MED ORDER — HEMOSTATIC AGENTS (NO CHARGE) OPTIME
TOPICAL | Status: DC | PRN
  Administered 2015-03-31: 1 via TOPICAL

## 2015-03-31 MED ORDER — HEPARIN SODIUM (PORCINE) 1000 UNIT/ML IJ SOLN
INTRAMUSCULAR | Status: DC | PRN
  Administered 2015-03-31: 5000 [IU] via INTRAVENOUS

## 2015-03-31 MED ORDER — PROTAMINE SULFATE 10 MG/ML IV SOLN
INTRAVENOUS | Status: DC | PRN
  Administered 2015-03-31 (×3): 10 mg via INTRAVENOUS

## 2015-03-31 MED ORDER — OXYCODONE-ACETAMINOPHEN 5-325 MG PO TABS: 1.0000 | ORAL_TABLET | Freq: Four times a day (QID) | ORAL | Status: DC | PRN

## 2015-03-31 MED ORDER — HYDROMORPHONE HCL 1 MG/ML IJ SOLN: 0.5000 mg | INTRAMUSCULAR | Status: DC | PRN

## 2015-03-31 MED ORDER — LIDOCAINE HCL (PF) 1 % IJ SOLN
INTRAMUSCULAR | Status: AC
  Filled 2015-03-31: qty 30

## 2015-03-31 MED ORDER — ONDANSETRON HCL 4 MG/2ML IJ SOLN: 4.0000 mg | Freq: Once | INTRAMUSCULAR | Status: DC | PRN

## 2015-03-31 MED ORDER — LIDOCAINE HCL (CARDIAC) 20 MG/ML IV SOLN
INTRAVENOUS | Status: AC
  Filled 2015-03-31: qty 5

## 2015-03-31 MED ORDER — PROTAMINE SULFATE 10 MG/ML IV SOLN
INTRAVENOUS | Status: AC
  Filled 2015-03-31: qty 5

## 2015-03-31 MED ORDER — FENTANYL CITRATE (PF) 250 MCG/5ML IJ SOLN
INTRAMUSCULAR | Status: AC
  Filled 2015-03-31: qty 5

## 2015-03-31 MED ORDER — SODIUM CHLORIDE 0.9 % IV SOLN
INTRAVENOUS | Status: DC | PRN
  Administered 2015-03-31: 500 mL

## 2015-03-31 MED ORDER — FENTANYL CITRATE (PF) 100 MCG/2ML IJ SOLN
INTRAMUSCULAR | Status: DC | PRN
Start: 2015-03-31 — End: 2015-03-31
  Administered 2015-03-31 (×2): 50 ug via INTRAVENOUS

## 2015-03-31 MED ORDER — ONDANSETRON HCL 4 MG/2ML IJ SOLN
INTRAMUSCULAR | Status: DC | PRN
  Administered 2015-03-31: 4 mg via INTRAVENOUS

## 2015-03-31 MED ORDER — SUCCINYLCHOLINE CHLORIDE 20 MG/ML IJ SOLN
INTRAMUSCULAR | Status: AC
  Filled 2015-03-31: qty 1

## 2015-03-31 MED ORDER — LIDOCAINE HCL (CARDIAC) 20 MG/ML IV SOLN
INTRAVENOUS | Status: DC | PRN
  Administered 2015-03-31: 40 mg via INTRATRACHEAL

## 2015-03-31 MED ORDER — 0.9 % SODIUM CHLORIDE (POUR BTL) OPTIME
TOPICAL | Status: DC | PRN
Start: 2015-03-31 — End: 2015-03-31
  Administered 2015-03-31: 1000 mL

## 2015-03-31 MED ORDER — PROPOFOL 500 MG/50ML IV EMUL
INTRAVENOUS | Status: DC | PRN
  Administered 2015-03-31: 50 ug/kg/min via INTRAVENOUS

## 2015-03-31 MED ORDER — ONDANSETRON HCL 4 MG/2ML IJ SOLN
INTRAMUSCULAR | Status: AC
  Filled 2015-03-31: qty 2

## 2015-03-31 SURGICAL SUPPLY — 41 items
AGENT HMST SPONGE THK3/8 (HEMOSTASIS) ×1
BNDG CMPR 9X4 STRL LF SNTH (GAUZE/BANDAGES/DRESSINGS) ×1
BNDG ESMARK 4X9 LF (GAUZE/BANDAGES/DRESSINGS) ×2 IMPLANT
CANISTER SUCTION 2500CC (MISCELLANEOUS) ×3 IMPLANT
CLIP TI MEDIUM 6 (CLIP) ×3 IMPLANT
CLIP TI WIDE RED SMALL 6 (CLIP) ×3 IMPLANT
COVER PROBE W GEL 5X96 (DRAPES) IMPLANT
CUFF TOURNIQUET SINGLE 18IN (TOURNIQUET CUFF) ×2 IMPLANT
DECANTER SPIKE VIAL GLASS SM (MISCELLANEOUS) ×3 IMPLANT
DRAIN PENROSE 1/2X12 LTX STRL (WOUND CARE) IMPLANT
ELECT REM PT RETURN 9FT ADLT (ELECTROSURGICAL) ×3
ELECTRODE REM PT RTRN 9FT ADLT (ELECTROSURGICAL) ×1 IMPLANT
GLOVE BIO SURGEON STRL SZ7 (GLOVE) ×3 IMPLANT
GLOVE BIOGEL PI IND STRL 6.5 (GLOVE) IMPLANT
GLOVE BIOGEL PI IND STRL 7.0 (GLOVE) IMPLANT
GLOVE BIOGEL PI IND STRL 7.5 (GLOVE) ×1 IMPLANT
GLOVE BIOGEL PI IND STRL 8 (GLOVE) IMPLANT
GLOVE BIOGEL PI INDICATOR 6.5 (GLOVE) ×6
GLOVE BIOGEL PI INDICATOR 7.0 (GLOVE) ×2
GLOVE BIOGEL PI INDICATOR 7.5 (GLOVE) ×2
GLOVE BIOGEL PI INDICATOR 8 (GLOVE) ×2
GLOVE ECLIPSE 6.5 STRL STRAW (GLOVE) ×2 IMPLANT
GOWN SPEC L4 XLG W/TWL (GOWN DISPOSABLE) ×2 IMPLANT
GOWN STRL REUS W/ TWL LRG LVL3 (GOWN DISPOSABLE) ×3 IMPLANT
GOWN STRL REUS W/TWL LRG LVL3 (GOWN DISPOSABLE) ×9
HEMOSTAT SPONGE AVITENE ULTRA (HEMOSTASIS) ×2 IMPLANT
KIT BASIN OR (CUSTOM PROCEDURE TRAY) ×3 IMPLANT
KIT ROOM TURNOVER OR (KITS) ×3 IMPLANT
LIQUID BAND (GAUZE/BANDAGES/DRESSINGS) ×3 IMPLANT
NS IRRIG 1000ML POUR BTL (IV SOLUTION) ×3 IMPLANT
PACK CV ACCESS (CUSTOM PROCEDURE TRAY) ×3 IMPLANT
PAD ARMBOARD 7.5X6 YLW CONV (MISCELLANEOUS) ×6 IMPLANT
SPONGE SURGIFOAM ABS GEL 100 (HEMOSTASIS) IMPLANT
SUT MNCRL AB 4-0 PS2 18 (SUTURE) ×3 IMPLANT
SUT PROLENE 6 0 BV (SUTURE) IMPLANT
SUT PROLENE 7 0 BV 1 (SUTURE) ×3 IMPLANT
SUT VIC AB 3-0 SH 27 (SUTURE) ×12
SUT VIC AB 3-0 SH 27X BRD (SUTURE) ×1 IMPLANT
SUT VICRYL 4-0 PS2 18IN ABS (SUTURE) ×2 IMPLANT
UNDERPAD 30X30 INCONTINENT (UNDERPADS AND DIAPERS) ×3 IMPLANT
WATER STERILE IRR 1000ML POUR (IV SOLUTION) ×3 IMPLANT

## 2015-03-31 NOTE — Op Note (Signed)
OPERATIVE NOTE   PROCEDURE: 1. Left cephalic vein turndown (brachiocephalic fistula transposition to axillary vein) 2. Plication of left brachiocephalic arteriovenous fistula pseudoaneurysm  PRE-OPERATIVE DIAGNOSIS: central venous cephalic vein stenosis, spontaneous pseudoaneurysm in left brachiocephalic arteriovenous fistula    POST-OPERATIVE DIAGNOSIS: same as above   SURGEON: [name], MD  ASSISTANT(S): CA Elinor, RNFA  ANESTHESIA: general  ESTIMATED BLOOD LOSS: 50 cc  FINDING(S): 1.  Large proximal cephalic vein: 7-8 mm with pulsatile character 2.  Large axillary vein: 6-7 mm 3.  Strong thrill at end of case with resolution of pulsatile character 4.  Thrombosed pseudoaneurysm neck with chronic thrombus in pseudoaneurysm  SPECIMEN(S):  none  INDICATIONS:  Albert [name] is a 60 y.o. male who presents with pulsatile left brachiocephalic arteriovenous fistula with fistulogram consistent central venous stenosis.  Based on the images, I recommended left cephalic vein turndown given the expected poor long-term patency with venoplasty.  While scheduling him for this procedure, he developed a spontaneous pseudoaneurysm in the proximal 1/3 of his fistula.  I recommended plicating this pseudoaneurysm during the case also.  Risk, benefits, and alternatives to access surgery were discussed.  The patient is aware the risks include but are not limited to: bleeding, infection, steal syndrome, nerve damage, ischemic monomelic neuropathy, failure to mature, need for additional procedures, death and stroke.  The patient agrees to proceed forward with the procedure.   DESCRIPTION: After obtaining full informed written consent, the patient was brought back to the operating room and placed supine upon the operating table.  The patient received IV antibiotics prior to induction.  After obtaining adequate anesthesia, the patient was prepped and draped in the standard fashion for: left arm  access.  I marked the location of his left axillary vein.  I injected 1% lidocaine without epinephrine at this site.  I made an incision over the left axillary vein.  I dissected it out.  It appeared to be 6-7 mm in diameter.  I then turned my attention to the proximal brachiocephalic arteriovenous fistula at the shoulder.  I made an incision over the fistula and dissected out 10 cm of the proximal vein.  I then tunneled from this incision to the axilla.  The patient was given 5000 units of Heparin intravenously.  After 3 minutes, I tied off this fistula proximally with two 2-0 silk ties.  I then clamped the fistula distally in this incision.  I transected the fistula and then passed the fistula through the subcutaneous tunnel with a clamp, delivering it to the axillary incision, taking care to maintain orientation.  I clamped the distal end of the fistula and then released the clamp on the fistula.  This verified the orientation of the fistula.  I reclamped the fistula distally and then released the clamp on the end of the fistula.  I flushed out the fistula with heparinized saline.  I sharply adjusted the length of the fistula, spatulating the fistula end at the end of the case.    I made an venotomy in the axillary vein and extended it proximally and distally.  I sharply lysed some valves evident in the axillary vein.  I sewed the brachiocephalic arteriovenous fistula to the axillary vein with a running stitch of 6-0 Prolene with an end-to-side anastomosis.  I backbled all ends prior to completing this anastomosis.  There was no thrombus present.  I completed the anastomosis in the usual fashion.  I released the clamps and immediately, there was a strong  thrill in the fistula with resolution of the prior pulsatile character.  I packed Avitene in this incision and the harvest incision.  I then placed a sterile tourniquet on the proximal arm.  I exsanguinated the left arm with a Esmark bandage.  I inflated  the tourniquet to 250 mm Hg.  I turned my attention to the previously marked pseudoaneurysm in the proximal 1/3 of the fistula.  I made an ellipitcal incision.  I dissected down through the subcutaneous tissue into the pseudoaneurysm cavity.  There was chronic thrombus in the pseudoaneurysm.  I evacuated all the thrombus, but I could not identify a neck.  I dropped the tourniquet and there was no active bleeding from the pseudoaneurysm.  I suspect the neck in this pseudoaneurysm has sspontaneously healed.  I reapproximated the pseudoaneurysm wall with a running stitch of 3-0 Vicryl.  The subcutaneous tissue was reapproximated with horizontal mattress stitches of 3-0 Vicryl.  The skin was cleaned and dried and reapproximated with a running subcuticular of 4-0 Vicryl.  The skin was reinforced with Dermabond.  At this point, I washed out the harvest incision and axillary incision.  I controlled a few bleeding points with electrocautery.  I closed the harvest incision and axillary incisions with a double layer of 3-0 Vicryl.  The skin at both incisions was closed with 4-0 Vicryl in a running subcuticular fashion.  The skin was cleaned, dried, and reinforced with Dermabond.  At the end of this case, there was a strong thrill in the fistula without any further pulsatile character.   COMPLICATIONS: none  CONDITION: stable   [name], MD [facility] [place] Office: [phone] Pager: [phone]  03/31/2015, 9:45 AM

## 2015-03-31 NOTE — Anesthesia Procedure Notes (Addendum)
Procedure Name: MAC Date/Time: 03/31/2015 7:35 AM Performed by: Barrington Ellison Pre-anesthesia Checklist: Patient identified, Emergency Drugs available, Suction available, Patient being monitored and Timeout performed Patient Re-evaluated:Patient Re-evaluated prior to inductionOxygen Delivery Method: Nasal cannula   Procedure Name: LMA Insertion Date/Time: 03/31/2015 8:01 AM Performed by: Barrington Ellison Pre-anesthesia Checklist: Patient identified, Emergency Drugs available, Suction available, Patient being monitored and Timeout performed Patient Re-evaluated:Patient Re-evaluated prior to inductionOxygen Delivery Method: Circle system utilized Preoxygenation: Pre-oxygenation with 100% oxygen Intubation Type: IV induction LMA: LMA inserted LMA Size: 5.0 Number of attempts: 1 Placement Confirmation: positive ETCO2 and breath sounds checked- equal and bilateral Tube secured with: Tape Dental Injury: Teeth and Oropharynx as per pre-operative assessment

## 2015-03-31 NOTE — Interval H&P Note (Signed)
History and Physical Interval Note:  03/31/2015 7:08 AM  Albert [name]  has presented today for surgery, with the diagnosis of End Stage Renal Disease 123XX123; Left cephalic vein stenosis 123XX123  The various methods of treatment have been discussed with the patient and family. After consideration of risks, benefits and other options for treatment, the patient has consented to  Procedure(s): CEPHALIC VEIN TURNDOWN; PLICATION OF BRACHIOCEPHALIC AVF (Left) as a surgical intervention .  The patient's history has been reviewed, patient examined, no change in status, stable for surgery.  I have reviewed the patient's chart and labs.  Questions were answered to the patient's satisfaction.     [name]

## 2015-03-31 NOTE — Anesthesia Postprocedure Evaluation (Signed)
Anesthesia Post Note  Patient: Albert [name]  Procedure(s) Performed: Procedure(s) (LRB): CEPHALIC VEIN TURNDOWN; PLICATION OF BRACHIOCEPHALIC AVF (Left)  Patient location during evaluation: PACU Anesthesia Type: General Level of consciousness: awake, awake and alert, oriented and patient cooperative Pain management: pain level controlled Vital Signs Assessment: post-procedure vital signs reviewed and stable Respiratory status: spontaneous breathing and respiratory function stable Cardiovascular status: blood pressure returned to baseline Anesthetic complications: no    Last Vitals:  Filed Vitals:   03/31/15 0956 03/31/15 0958  BP:  111/76  Pulse: 72 76  Temp: 36.4 C   Resp: 16 15    Last Pain: There were no vitals filed for this visit.               Irineo Gaulin EDWARD

## 2015-03-31 NOTE — H&P (View-Only) (Signed)
Established Dialysis Access  History of Present Illness  Albert [name] is a 60 y.o. (13-Apr-1955) male who presents for re-evaluation for permanent access.  The patient recently had a L arm fistulogram done on 03/10/15.  On that fistulogram, he had a proximal cephalic stenosis at its confluence with subclavian vein chest.  Due to the severe tortuosity, this fistula is not easily amendable to percutaneous intervention.  The L axillary vein could not be easily evaluated as the presence of a valve prevented reflux into the L axilla vein.  I recommended he come back to the office for a venous duplex to see if he had an adequate axillary vein for a cephallic vein turndown.  Also he notes over the last week, onset of a "bubble" in his fistula.  Past Medical History  Diagnosis Date  . Renal cell cancer ([place])   . Mixed hyperlipidemia   . Type 2 diabetes mellitus ([place])   . Atrial fibrillation ([place])   . Essential hypertension, benign   . ESRD on hemodialysis ([place])     Dr. Lowanda Foster  . Gout   . Diverticulosis of colon   . Colonic polyp   . Cardiomyopathy     LVEF 50-55% 8/11 - [facility]  . OSA (obstructive sleep apnea)     CPAP    Past Surgical History  Procedure Laterality Date  . Right nephrectomy    . Left arm av fistula  2009    Dr. [name]  . Fistulogram N/A 10/19/2013    Procedure: FISTULOGRAM;  Surgeon: Rosetta Posner, MD;  Location: [facility] CATH LAB;  Service: Cardiovascular;  Laterality: N/A;  . Peripheral vascular catheterization N/A 03/10/2015    Procedure: Fistulagram;  Surgeon: Conrad [place]ap [place], MD;  Location: [facility] CV LAB;  Service: Cardiovascular;  Laterality: N/A;    Social History   Social History  . Marital Status: Single    Spouse Name: N/A  . Number of Children: N/A  . Years of Education: N/A   Occupational History  . Not on file.   Social History Main Topics  . Smoking status: Former Smoker    Types: Cigarettes  . Smokeless tobacco: Never Used  . Alcohol Use:  Yes     Comment: Occasional  . Drug Use: No  . Sexual Activity: Not on file   Other Topics Concern  . Not on file   Social History Narrative    Family History  Problem Relation Age of Onset  . Hypertension      Siblings  . Diabetes type II      Siblings  . Hypertension Mother     Current Outpatient Prescriptions  Medication Sig Dispense Refill  . allopurinol (ZYLOPRIM) 100 MG tablet Take 100 mg by mouth daily.     . carvedilol (COREG) 25 MG tablet TAKE 1 TABLET TWICE DAILY WITH MEALS (Patient taking differently: TAKE 0.5 TABLET TWICE DAILY WITH MEALS) 180 tablet 3  . cinacalcet (SENSIPAR) 30 MG tablet Take 30 mg by mouth daily.    Marland Kitchen diltiazem (TIAZAC) 300 MG 24 hr capsule TAKE 1 CAPSULE EVERY DAY 90 capsule 3  . doxazosin (CARDURA) 2 MG tablet Take 2 mg by mouth daily.    . fenofibrate 160 MG tablet Take 160 mg by mouth daily.    . fish oil-omega-3 fatty acids 1000 MG capsule Take 1 g by mouth 3 (three) times daily.     Marland Kitchen linagliptin (TRADJENTA) 5 MG TABS tablet Take 5 mg by mouth daily.    Marland Kitchen  pravastatin (PRAVACHOL) 10 MG tablet Take 5 mg by mouth daily. Take every other night    . sevelamer carbonate (RENVELA) 800 MG tablet Take 800 mg by mouth as directed. Take 2 tablets after each meal and 1 after each snack  (8 tablets daily)    . Tetrahydrozoline HCl (VISINE OP) Place 2 drops into both eyes daily as needed (dry eyes).    . warfarin (COUMADIN) 2.5 MG tablet Take 1 1/2 tablets daily except 1 tablet on Tuesdays, Thursdays and Saturdays (Patient taking differently: Take 3.75 mg by mouth daily. Take 1 1/2 tablets daily except 1 tablet on Tuesdays, Thursdays and Saturdays) 135 tablet 3   No current facility-administered medications for this visit.     No Known Allergies   REVIEW OF SYSTEMS:  (Positives checked otherwise negative)  CARDIOVASCULAR:   [ ]  chest pain,  [ ]  chest pressure,  [ ]  palpitations,  [ ]  shortness of breath when laying flat,  [ ]  shortness of breath  with exertion,   [ ]  pain in feet when walking,  [ ]  pain in feet when laying flat, [ ]  history of blood clot in veins (DVT),  [ ]  history of phlebitis,  [ ]  swelling in legs,  [ ]  varicose veins  PULMONARY:   [ ]  productive cough,  [ ]  asthma,  [ ]  wheezing  NEUROLOGIC:   [ ]  weakness in arms or legs,  [ ]  numbness in arms or legs,  [ ]  difficulty speaking or slurred speech,  [ ]  temporary loss of vision in one eye,  [ ]  dizziness  HEMATOLOGIC:   [ ]  bleeding problems,  [ ]  problems with blood clotting too easily  MUSCULOSKEL:   [ ]  joint pain, [ ]  joint swelling  GASTROINTEST:   [ ]  vomiting blood,  [ ]  blood in stool     GENITOURINARY:   [ ]  burning with urination,  [ ]  blood in urine [x]  ESRD-HD: T/R/S  PSYCHIATRIC:   [ ]  history of major depression  INTEGUMENTARY:   [ ]  rashes,  [ ]  ulcers  CONSTITUTIONAL:   [ ]  fever,  [ ]  chills    Physical Examination  Filed Vitals:   03/19/15 1441  BP: 106/70  Pulse: 92  Temp: 98.6 F (37 C)  TempSrc: Oral  Resp: 16  Height: 6\' 2"  (1.88 m)  Weight: 216 lb (97.977 kg)  SpO2: 97%   Body mass index is 27.72 kg/(m^2).  General: A&O x 3, WD, WN  Pulmonary: Sym exp, good air movt, CTAB, no rales, rhonchi, & wheezing  Cardiac: RRR, Nl S1, S2, no Murmurs, rubs or gallops  Vascular: Vessel Right Left  Radial Palpable Not Palpable  Ulnar Faintly Palpable Not Palpable  Brachial Palpable Palpable   Gastrointestinal: soft, NTND, no G/R, bo HSM, no masses, no CVAT B  Musculoskeletal: M/S 5/5 throughout , Extremities without  ischemic changes ,  palpable thrill in access in LUA , bruit in access, PSA in mid-segment with attenuated skin, tortuous aneurysmal fistula  Neurologic: Pain and light touch intact in extremities , Motor exam as listed above  Non-Invasive Vascular Imaging  Left  Axillary Vein Duplex  (Date: 03/19/2015):   Diameters:  1.0-1.3 cm  Widely patent without thrombus  Medical Decision  Making  Albert [name] is a 60 y.o. male who presents with ESRD requiring hemodialysis, proximal cephalic vein stenosis [id number], PSA in L BC AVF at risk for rupture   Based on his L  venogram and axillary vein duplex, I recommend proceeding with a L cephalic vein turndown, plication of L BC AVF PSA. Risk, benefits, and alternatives to access surgery were discussed.   The patient is aware the risks include but are not limited to: bleeding, infection, steal syndrome, nerve damage, ischemic monomelic neuropathy, failure to mature, need for additional procedures, death and stroke.    The patient has agreed to proceed with the above procedure which will be scheduled 6 FEB 17.   [name], MD [facility] Office: [phone] Pager: [phone]  03/19/2015, 3:05 PM

## 2015-03-31 NOTE — Anesthesia Preprocedure Evaluation (Addendum)
Anesthesia Evaluation  Patient identified by MRN, date of birth, ID band Patient awake    Reviewed: Allergy & Precautions, NPO status , Patient's Chart, lab work & pertinent test results  Airway Mallampati: I  TM Distance: >3 FB     Dental  (+) Partial Upper, Missing, Dental Advisory Given, Teeth Intact,    Pulmonary sleep apnea , former smoker,     + decreased breath sounds      Cardiovascular hypertension, + dysrhythmias Atrial Fibrillation  Rhythm:Irregular Rate:Abnormal     Neuro/Psych    GI/Hepatic   Endo/Other  diabetes, Type 2  Renal/GU DialysisRenal disease     Musculoskeletal   Abdominal   Peds  Hematology  (+) anemia ,   Anesthesia Other Findings   Reproductive/Obstetrics                            Anesthesia Physical Anesthesia Plan  ASA: III  Anesthesia Plan: MAC and General   Post-op Pain Management:    Induction: Intravenous  Airway Management Planned: Mask and Oral ETT  Additional Equipment:   Intra-op Plan:   Post-operative Plan: Extubation in OR  Informed Consent: I have reviewed the patients History and Physical, chart, labs and discussed the procedure including the risks, benefits and alternatives for the proposed anesthesia with the patient or authorized representative who has indicated his/her understanding and acceptance.     Plan Discussed with: CRNA, Anesthesiologist and Surgeon  Anesthesia Plan Comments:         Anesthesia Quick Evaluation

## 2015-03-31 NOTE — Transfer of Care (Signed)
Immediate Anesthesia Transfer of Care Note  Patient: Albert [name]  Procedure(s) Performed: Procedure(s): CEPHALIC VEIN TURNDOWN; PLICATION OF BRACHIOCEPHALIC AVF (Left)  Patient Location: PACU  Anesthesia Type:General  Level of Consciousness: lethargic and responds to stimulation  Airway & Oxygen Therapy: Patient Spontanous Breathing and Patient connected to face mask oxygen  Post-op Assessment: Report given to RN  Post vital signs: Reviewed and stable  Last Vitals:  Filed Vitals:   03/31/15 0623  BP: 136/77  Pulse: 84  Temp: 36.6 C  Resp: 16    Complications: No apparent anesthesia complications

## 2015-04-01 ENCOUNTER — Encounter (HOSPITAL_COMMUNITY): Payer: Self-pay | Admitting: Vascular Surgery

## 2015-04-01 DIAGNOSIS — N186 End stage renal disease: Secondary | ICD-10-CM | POA: Diagnosis not present

## 2015-04-01 DIAGNOSIS — D631 Anemia in chronic kidney disease: Secondary | ICD-10-CM | POA: Diagnosis not present

## 2015-04-01 DIAGNOSIS — Z992 Dependence on renal dialysis: Secondary | ICD-10-CM | POA: Diagnosis not present

## 2015-04-01 DIAGNOSIS — D509 Iron deficiency anemia, unspecified: Secondary | ICD-10-CM | POA: Diagnosis not present

## 2015-04-01 DIAGNOSIS — N2581 Secondary hyperparathyroidism of renal origin: Secondary | ICD-10-CM | POA: Diagnosis not present

## 2015-04-03 ENCOUNTER — Telehealth: Payer: Self-pay | Admitting: Vascular Surgery

## 2015-04-03 DIAGNOSIS — N186 End stage renal disease: Secondary | ICD-10-CM | POA: Diagnosis not present

## 2015-04-03 DIAGNOSIS — D631 Anemia in chronic kidney disease: Secondary | ICD-10-CM | POA: Diagnosis not present

## 2015-04-03 DIAGNOSIS — D509 Iron deficiency anemia, unspecified: Secondary | ICD-10-CM | POA: Diagnosis not present

## 2015-04-03 DIAGNOSIS — N2581 Secondary hyperparathyroidism of renal origin: Secondary | ICD-10-CM | POA: Diagnosis not present

## 2015-04-03 DIAGNOSIS — Z992 Dependence on renal dialysis: Secondary | ICD-10-CM | POA: Diagnosis not present

## 2015-04-03 NOTE — Telephone Encounter (Signed)
LM for pt re appt, dpm °

## 2015-04-03 NOTE — Telephone Encounter (Signed)
-----   Message from Mena Goes, RN sent at 03/31/2015 10:38 AM EST ----- Regarding: schedule   ----- Message -----    From: Alvia Grove, PA-C    Sent: 03/31/2015   9:45 AM      To: Vvs Charge Pool  S/p left cephalic vein turndown and plication of left AVF pseudoaneurysm 03/31/15  F/u with Dr. Bridgett Larsson. 4 weeks. No studies.   Thanks Maudie Mercury

## 2015-04-05 DIAGNOSIS — N186 End stage renal disease: Secondary | ICD-10-CM | POA: Diagnosis not present

## 2015-04-05 DIAGNOSIS — N2581 Secondary hyperparathyroidism of renal origin: Secondary | ICD-10-CM | POA: Diagnosis not present

## 2015-04-05 DIAGNOSIS — D631 Anemia in chronic kidney disease: Secondary | ICD-10-CM | POA: Diagnosis not present

## 2015-04-05 DIAGNOSIS — Z992 Dependence on renal dialysis: Secondary | ICD-10-CM | POA: Diagnosis not present

## 2015-04-05 DIAGNOSIS — D509 Iron deficiency anemia, unspecified: Secondary | ICD-10-CM | POA: Diagnosis not present

## 2015-04-07 ENCOUNTER — Other Ambulatory Visit: Payer: Self-pay | Admitting: *Deleted

## 2015-04-07 ENCOUNTER — Encounter: Payer: Self-pay | Admitting: *Deleted

## 2015-04-07 NOTE — Patient Outreach (Signed)
[facility]) Care Management  04/07/2015  KELLAR WESTBERG Feb 05, 1956 [id number]  Follow up call; patient voices HIPPA verification.    Subjective: Patient voices that he has not had any emergency room visit or hospital admission since our last contact. States he was diagnosed with pseudoaneurysm & had procedure on fistula 02/06 and it is working well now. States he has had dialysis several times since the procedure without problems. States area is slightly sore but no signs of infection-such as swelling, redness., drainage or fever. States he will report to doctor if infection symptoms occur.  Patient voices that he will plans to have routine eye exam next month and also wants to get routine colonoscopy scheduled if he can work into schedule.    Voices he has received and reviewed educational information on falls prevention. He was able to voice falls prevention strategies and voice importance of having cane available to use if needed.  Hel voices that he has started walking 20 minutes twice weekly. States he has not had any falls but understands he is a risk.   Patient voices that he has signed consent form and mailed to [facility] last week .  Plan;  Care plan updated as noted.  THN CM Care Plan Problem One        Most Recent Value   Care Plan Problem One  Potential for falls due to disease process   Role Documenting the Problem One  Care Management Telephonic Coordinator   Care Plan for Problem One  Active   THN Long Term Goal (31-90 days)  Avoidance of falls for next 31 days   THN Long Term Goal Start Date  03/24/15   Interventions for Problem One Long Term Goal  Explanations to patient of falls prevention startegies, send EMMI materials [Re-enforce falls prevention strategoes as noted in intervent]   THN CM Short Term Goal #1 (0-30 days)  Pt to verbalize 3 fall prevention strategies within 30 days   THN CM Short Term Goal #1 Start Date  03/24/15   [facility] CM Short Term  Goal #1 Met Date  04/07/15   Interventions for Short Term Goal #1  Pt states remove clutter, don't use step stools, use good lighting, no throw rugs   THN CM Short Term Goal #2 (0-30 days)  patient will verbalize when to use assistive devices within 30 days as related to falls prevention   THN CM Short Term Goal #2 Start Date  03/24/15   [facility] CM Short Term Goal #2 Met Date  04/07/15   Interventions for Short Term Goal #2  pt voice will use cane when having neuropathy pain and when weak from dialysis      Will follow up & follow care plan as noted. Patient agrees with set appointment.   Sherrin Daisy, RN BSN [place] Management Coordinator [facility] Care Management  [phone]

## 2015-04-08 ENCOUNTER — Encounter: Payer: Self-pay | Admitting: *Deleted

## 2015-04-08 DIAGNOSIS — N186 End stage renal disease: Secondary | ICD-10-CM | POA: Diagnosis not present

## 2015-04-08 DIAGNOSIS — D631 Anemia in chronic kidney disease: Secondary | ICD-10-CM | POA: Diagnosis not present

## 2015-04-08 DIAGNOSIS — N2581 Secondary hyperparathyroidism of renal origin: Secondary | ICD-10-CM | POA: Diagnosis not present

## 2015-04-08 DIAGNOSIS — Z992 Dependence on renal dialysis: Secondary | ICD-10-CM | POA: Diagnosis not present

## 2015-04-08 DIAGNOSIS — D509 Iron deficiency anemia, unspecified: Secondary | ICD-10-CM | POA: Diagnosis not present

## 2015-04-10 DIAGNOSIS — D631 Anemia in chronic kidney disease: Secondary | ICD-10-CM | POA: Diagnosis not present

## 2015-04-10 DIAGNOSIS — Z992 Dependence on renal dialysis: Secondary | ICD-10-CM | POA: Diagnosis not present

## 2015-04-10 DIAGNOSIS — N2581 Secondary hyperparathyroidism of renal origin: Secondary | ICD-10-CM | POA: Diagnosis not present

## 2015-04-10 DIAGNOSIS — D509 Iron deficiency anemia, unspecified: Secondary | ICD-10-CM | POA: Diagnosis not present

## 2015-04-10 DIAGNOSIS — N186 End stage renal disease: Secondary | ICD-10-CM | POA: Diagnosis not present

## 2015-04-11 ENCOUNTER — Encounter: Payer: Self-pay | Admitting: Internal Medicine

## 2015-04-12 DIAGNOSIS — D631 Anemia in chronic kidney disease: Secondary | ICD-10-CM | POA: Diagnosis not present

## 2015-04-12 DIAGNOSIS — Z992 Dependence on renal dialysis: Secondary | ICD-10-CM | POA: Diagnosis not present

## 2015-04-12 DIAGNOSIS — D509 Iron deficiency anemia, unspecified: Secondary | ICD-10-CM | POA: Diagnosis not present

## 2015-04-12 DIAGNOSIS — N186 End stage renal disease: Secondary | ICD-10-CM | POA: Diagnosis not present

## 2015-04-12 DIAGNOSIS — N2581 Secondary hyperparathyroidism of renal origin: Secondary | ICD-10-CM | POA: Diagnosis not present

## 2015-04-15 DIAGNOSIS — D631 Anemia in chronic kidney disease: Secondary | ICD-10-CM | POA: Diagnosis not present

## 2015-04-15 DIAGNOSIS — N186 End stage renal disease: Secondary | ICD-10-CM | POA: Diagnosis not present

## 2015-04-15 DIAGNOSIS — N2581 Secondary hyperparathyroidism of renal origin: Secondary | ICD-10-CM | POA: Diagnosis not present

## 2015-04-15 DIAGNOSIS — D509 Iron deficiency anemia, unspecified: Secondary | ICD-10-CM | POA: Diagnosis not present

## 2015-04-15 DIAGNOSIS — Z992 Dependence on renal dialysis: Secondary | ICD-10-CM | POA: Diagnosis not present

## 2015-04-17 DIAGNOSIS — Z992 Dependence on renal dialysis: Secondary | ICD-10-CM | POA: Diagnosis not present

## 2015-04-17 DIAGNOSIS — N2581 Secondary hyperparathyroidism of renal origin: Secondary | ICD-10-CM | POA: Diagnosis not present

## 2015-04-17 DIAGNOSIS — D631 Anemia in chronic kidney disease: Secondary | ICD-10-CM | POA: Diagnosis not present

## 2015-04-17 DIAGNOSIS — D509 Iron deficiency anemia, unspecified: Secondary | ICD-10-CM | POA: Diagnosis not present

## 2015-04-17 DIAGNOSIS — N186 End stage renal disease: Secondary | ICD-10-CM | POA: Diagnosis not present

## 2015-04-19 DIAGNOSIS — Z992 Dependence on renal dialysis: Secondary | ICD-10-CM | POA: Diagnosis not present

## 2015-04-19 DIAGNOSIS — D509 Iron deficiency anemia, unspecified: Secondary | ICD-10-CM | POA: Diagnosis not present

## 2015-04-19 DIAGNOSIS — D631 Anemia in chronic kidney disease: Secondary | ICD-10-CM | POA: Diagnosis not present

## 2015-04-19 DIAGNOSIS — N186 End stage renal disease: Secondary | ICD-10-CM | POA: Diagnosis not present

## 2015-04-19 DIAGNOSIS — N2581 Secondary hyperparathyroidism of renal origin: Secondary | ICD-10-CM | POA: Diagnosis not present

## 2015-04-21 ENCOUNTER — Encounter: Payer: Self-pay | Admitting: Internal Medicine

## 2015-04-21 ENCOUNTER — Ambulatory Visit (INDEPENDENT_AMBULATORY_CARE_PROVIDER_SITE_OTHER): Payer: Commercial Managed Care - HMO | Admitting: *Deleted

## 2015-04-21 DIAGNOSIS — Z5181 Encounter for therapeutic drug level monitoring: Secondary | ICD-10-CM

## 2015-04-21 DIAGNOSIS — N2889 Other specified disorders of kidney and ureter: Secondary | ICD-10-CM | POA: Diagnosis not present

## 2015-04-21 DIAGNOSIS — I4891 Unspecified atrial fibrillation: Secondary | ICD-10-CM

## 2015-04-21 DIAGNOSIS — C642 Malignant neoplasm of left kidney, except renal pelvis: Secondary | ICD-10-CM | POA: Diagnosis not present

## 2015-04-21 LAB — POCT INR: INR: 3

## 2015-04-22 DIAGNOSIS — D509 Iron deficiency anemia, unspecified: Secondary | ICD-10-CM | POA: Diagnosis not present

## 2015-04-22 DIAGNOSIS — D631 Anemia in chronic kidney disease: Secondary | ICD-10-CM | POA: Diagnosis not present

## 2015-04-22 DIAGNOSIS — N186 End stage renal disease: Secondary | ICD-10-CM | POA: Diagnosis not present

## 2015-04-22 DIAGNOSIS — N2581 Secondary hyperparathyroidism of renal origin: Secondary | ICD-10-CM | POA: Diagnosis not present

## 2015-04-22 DIAGNOSIS — Z992 Dependence on renal dialysis: Secondary | ICD-10-CM | POA: Diagnosis not present

## 2015-04-23 DIAGNOSIS — E119 Type 2 diabetes mellitus without complications: Secondary | ICD-10-CM | POA: Diagnosis not present

## 2015-04-23 DIAGNOSIS — E1129 Type 2 diabetes mellitus with other diabetic kidney complication: Secondary | ICD-10-CM | POA: Diagnosis not present

## 2015-04-24 DIAGNOSIS — N186 End stage renal disease: Secondary | ICD-10-CM | POA: Diagnosis not present

## 2015-04-24 DIAGNOSIS — Z992 Dependence on renal dialysis: Secondary | ICD-10-CM | POA: Diagnosis not present

## 2015-04-25 ENCOUNTER — Ambulatory Visit: Payer: Self-pay | Admitting: *Deleted

## 2015-04-26 DIAGNOSIS — N186 End stage renal disease: Secondary | ICD-10-CM | POA: Diagnosis not present

## 2015-04-26 DIAGNOSIS — Z992 Dependence on renal dialysis: Secondary | ICD-10-CM | POA: Diagnosis not present

## 2015-04-28 ENCOUNTER — Encounter: Payer: Self-pay | Admitting: Vascular Surgery

## 2015-04-28 DIAGNOSIS — Z0000 Encounter for general adult medical examination without abnormal findings: Secondary | ICD-10-CM | POA: Diagnosis not present

## 2015-04-28 DIAGNOSIS — C642 Malignant neoplasm of left kidney, except renal pelvis: Secondary | ICD-10-CM | POA: Diagnosis not present

## 2015-04-29 DIAGNOSIS — Z992 Dependence on renal dialysis: Secondary | ICD-10-CM | POA: Diagnosis not present

## 2015-04-29 DIAGNOSIS — N186 End stage renal disease: Secondary | ICD-10-CM | POA: Diagnosis not present

## 2015-05-01 DIAGNOSIS — Z992 Dependence on renal dialysis: Secondary | ICD-10-CM | POA: Diagnosis not present

## 2015-05-01 DIAGNOSIS — N186 End stage renal disease: Secondary | ICD-10-CM | POA: Diagnosis not present

## 2015-05-02 ENCOUNTER — Ambulatory Visit (INDEPENDENT_AMBULATORY_CARE_PROVIDER_SITE_OTHER): Payer: Commercial Managed Care - HMO | Admitting: Vascular Surgery

## 2015-05-02 ENCOUNTER — Encounter: Payer: Self-pay | Admitting: Vascular Surgery

## 2015-05-02 VITALS — BP 131/79 | HR 93 | Temp 97.9°F | Resp 18 | Ht 74.0 in | Wt 216.0 lb

## 2015-05-02 DIAGNOSIS — N186 End stage renal disease: Secondary | ICD-10-CM

## 2015-05-02 DIAGNOSIS — Z992 Dependence on renal dialysis: Secondary | ICD-10-CM

## 2015-05-02 NOTE — Progress Notes (Signed)
    Postoperative Access Visit   History of Present Illness  BENI KUBAS is a 60 y.o. year old male who presents for postoperative follow-up for: L cephalic vein turndown, L BC AVF PSA plication (Date: 0000000).  The patient's wounds are healed.  The patient notes no steal symptoms.  The patient is able to complete their activities of daily living.  The patient's current symptoms are: none.  For VQI Use Only  PRE-ADM LIVING: Home  AMB STATUS: Ambulatory  Physical Examination Filed Vitals:   05/02/15 1148  BP: 131/79  Pulse: 93  Temp: 97.9 F (36.6 C)  Resp: 18    LUE: Incisions are healed, skin feels warm, hand grip is 5/5, sensation in digits is intact, strong palpable thrill, strong bruit can be auscultated (improved from preop)  Medical Decision Making  KARMELO DOWNEN is a 60 y.o. year old male who presents s/p L cephalic vein turndown, L BC AVF PSA plication.   Patient can follow up with [place] as needed.  Thank you for allowing [place] to participate in this patient's care.  [name], MD [facility] [place] Office: [phone] Pager: [phone]  05/02/2015, 12:08 PM

## 2015-05-03 DIAGNOSIS — N186 End stage renal disease: Secondary | ICD-10-CM | POA: Diagnosis not present

## 2015-05-03 DIAGNOSIS — Z992 Dependence on renal dialysis: Secondary | ICD-10-CM | POA: Diagnosis not present

## 2015-05-06 DIAGNOSIS — Z992 Dependence on renal dialysis: Secondary | ICD-10-CM | POA: Diagnosis not present

## 2015-05-06 DIAGNOSIS — N186 End stage renal disease: Secondary | ICD-10-CM | POA: Diagnosis not present

## 2015-05-08 DIAGNOSIS — Z992 Dependence on renal dialysis: Secondary | ICD-10-CM | POA: Diagnosis not present

## 2015-05-08 DIAGNOSIS — N186 End stage renal disease: Secondary | ICD-10-CM | POA: Diagnosis not present

## 2015-05-10 DIAGNOSIS — N186 End stage renal disease: Secondary | ICD-10-CM | POA: Diagnosis not present

## 2015-05-10 DIAGNOSIS — Z992 Dependence on renal dialysis: Secondary | ICD-10-CM | POA: Diagnosis not present

## 2015-05-12 ENCOUNTER — Ambulatory Visit (INDEPENDENT_AMBULATORY_CARE_PROVIDER_SITE_OTHER): Payer: Commercial Managed Care - HMO | Admitting: *Deleted

## 2015-05-12 DIAGNOSIS — Z5181 Encounter for therapeutic drug level monitoring: Secondary | ICD-10-CM | POA: Diagnosis not present

## 2015-05-12 DIAGNOSIS — I4891 Unspecified atrial fibrillation: Secondary | ICD-10-CM | POA: Diagnosis not present

## 2015-05-12 LAB — POCT INR: INR: 3.6

## 2015-05-13 ENCOUNTER — Ambulatory Visit: Payer: Commercial Managed Care - HMO | Admitting: Internal Medicine

## 2015-05-13 DIAGNOSIS — Z992 Dependence on renal dialysis: Secondary | ICD-10-CM | POA: Diagnosis not present

## 2015-05-13 DIAGNOSIS — N186 End stage renal disease: Secondary | ICD-10-CM | POA: Diagnosis not present

## 2015-05-15 DIAGNOSIS — N186 End stage renal disease: Secondary | ICD-10-CM | POA: Diagnosis not present

## 2015-05-15 DIAGNOSIS — Z992 Dependence on renal dialysis: Secondary | ICD-10-CM | POA: Diagnosis not present

## 2015-05-16 ENCOUNTER — Telehealth: Payer: Self-pay | Admitting: Internal Medicine

## 2015-05-16 NOTE — Telephone Encounter (Signed)
[facility] WOULD NOT DO A REFERRAL UNLESS PT CAME IN FOR AN OFFICE VISIT.  CALLED THE PATIENT AND TOLD HIM AND HE SAID IT HAD NOT BEEN LONG SINCE HE SAW THEM BUT HE WOULD MAKE AN APPOINTMENT.  TOLD HIM TO CALL us TO RESCHEDULE AFTER THAT WAS DONE.

## 2015-05-17 DIAGNOSIS — N186 End stage renal disease: Secondary | ICD-10-CM | POA: Diagnosis not present

## 2015-05-17 DIAGNOSIS — Z992 Dependence on renal dialysis: Secondary | ICD-10-CM | POA: Diagnosis not present

## 2015-05-19 NOTE — Telephone Encounter (Signed)
Reviewed

## 2015-05-20 DIAGNOSIS — Z992 Dependence on renal dialysis: Secondary | ICD-10-CM | POA: Diagnosis not present

## 2015-05-20 DIAGNOSIS — N186 End stage renal disease: Secondary | ICD-10-CM | POA: Diagnosis not present

## 2015-05-21 DIAGNOSIS — E663 Overweight: Secondary | ICD-10-CM | POA: Diagnosis not present

## 2015-05-21 DIAGNOSIS — N186 End stage renal disease: Secondary | ICD-10-CM | POA: Diagnosis not present

## 2015-05-21 DIAGNOSIS — Z1389 Encounter for screening for other disorder: Secondary | ICD-10-CM | POA: Diagnosis not present

## 2015-05-21 DIAGNOSIS — K635 Polyp of colon: Secondary | ICD-10-CM | POA: Diagnosis not present

## 2015-05-21 DIAGNOSIS — Z6827 Body mass index (BMI) 27.0-27.9, adult: Secondary | ICD-10-CM | POA: Diagnosis not present

## 2015-05-22 ENCOUNTER — Other Ambulatory Visit: Payer: Self-pay | Admitting: *Deleted

## 2015-05-22 DIAGNOSIS — N186 End stage renal disease: Secondary | ICD-10-CM | POA: Diagnosis not present

## 2015-05-22 DIAGNOSIS — Z992 Dependence on renal dialysis: Secondary | ICD-10-CM | POA: Diagnosis not present

## 2015-05-22 NOTE — Patient Outreach (Signed)
[facility]) Care Management  05/22/2015  Albert [name] 09-16-1955 [id number]     Telephone call to patient who gave HIPPA verification.    Patient voices that he is doing well.  Attending dialysis sessions without problems.  States he continues to transport himself but is aware of community transportation if needed. States recently had primary care visit and has also had shingles vaccine. No changes in medications and continues to take as directed by doctors.  States he has not had any falls but has cane available if needed. Voices understanding of falls prevention strategies.  Patient states he has living will in place, however does not have health care power of attorney. States he plans to talk to family member about that.  Patient consents to receive Advance Care packet for resource information.  Patient goals have been met. Patient agrees to case closure.  Plan: Send resource information to patient. Send MD closure letter Send to care management assistant for case closure.   Sherrin Daisy, RN BSN [place] Management Coordinator [facility] Care Management  [phone]

## 2015-05-23 ENCOUNTER — Ambulatory Visit: Payer: Commercial Managed Care - HMO | Admitting: Internal Medicine

## 2015-05-23 DIAGNOSIS — N186 End stage renal disease: Secondary | ICD-10-CM | POA: Diagnosis not present

## 2015-05-23 DIAGNOSIS — Z992 Dependence on renal dialysis: Secondary | ICD-10-CM | POA: Diagnosis not present

## 2015-05-24 DIAGNOSIS — N186 End stage renal disease: Secondary | ICD-10-CM | POA: Diagnosis not present

## 2015-05-24 DIAGNOSIS — Z992 Dependence on renal dialysis: Secondary | ICD-10-CM | POA: Diagnosis not present

## 2015-05-27 DIAGNOSIS — N186 End stage renal disease: Secondary | ICD-10-CM | POA: Diagnosis not present

## 2015-05-27 DIAGNOSIS — Z992 Dependence on renal dialysis: Secondary | ICD-10-CM | POA: Diagnosis not present

## 2015-05-28 ENCOUNTER — Encounter: Payer: Self-pay | Admitting: *Deleted

## 2015-05-29 ENCOUNTER — Encounter: Payer: Self-pay | Admitting: *Deleted

## 2015-05-29 DIAGNOSIS — Z992 Dependence on renal dialysis: Secondary | ICD-10-CM | POA: Diagnosis not present

## 2015-05-29 DIAGNOSIS — N186 End stage renal disease: Secondary | ICD-10-CM | POA: Diagnosis not present

## 2015-05-31 DIAGNOSIS — N186 End stage renal disease: Secondary | ICD-10-CM | POA: Diagnosis not present

## 2015-05-31 DIAGNOSIS — Z992 Dependence on renal dialysis: Secondary | ICD-10-CM | POA: Diagnosis not present

## 2015-06-02 ENCOUNTER — Ambulatory Visit (INDEPENDENT_AMBULATORY_CARE_PROVIDER_SITE_OTHER): Payer: Commercial Managed Care - HMO | Admitting: *Deleted

## 2015-06-02 DIAGNOSIS — I4891 Unspecified atrial fibrillation: Secondary | ICD-10-CM | POA: Diagnosis not present

## 2015-06-02 DIAGNOSIS — Z5181 Encounter for therapeutic drug level monitoring: Secondary | ICD-10-CM | POA: Diagnosis not present

## 2015-06-02 LAB — POCT INR: INR: 2.5

## 2015-06-03 DIAGNOSIS — N186 End stage renal disease: Secondary | ICD-10-CM | POA: Diagnosis not present

## 2015-06-03 DIAGNOSIS — E119 Type 2 diabetes mellitus without complications: Secondary | ICD-10-CM | POA: Diagnosis not present

## 2015-06-03 DIAGNOSIS — N2581 Secondary hyperparathyroidism of renal origin: Secondary | ICD-10-CM | POA: Diagnosis not present

## 2015-06-03 DIAGNOSIS — Z992 Dependence on renal dialysis: Secondary | ICD-10-CM | POA: Diagnosis not present

## 2015-06-05 DIAGNOSIS — Z992 Dependence on renal dialysis: Secondary | ICD-10-CM | POA: Diagnosis not present

## 2015-06-05 DIAGNOSIS — N186 End stage renal disease: Secondary | ICD-10-CM | POA: Diagnosis not present

## 2015-06-07 DIAGNOSIS — Z992 Dependence on renal dialysis: Secondary | ICD-10-CM | POA: Diagnosis not present

## 2015-06-07 DIAGNOSIS — N186 End stage renal disease: Secondary | ICD-10-CM | POA: Diagnosis not present

## 2015-06-10 DIAGNOSIS — D631 Anemia in chronic kidney disease: Secondary | ICD-10-CM | POA: Diagnosis not present

## 2015-06-10 DIAGNOSIS — Z992 Dependence on renal dialysis: Secondary | ICD-10-CM | POA: Diagnosis not present

## 2015-06-10 DIAGNOSIS — N186 End stage renal disease: Secondary | ICD-10-CM | POA: Diagnosis not present

## 2015-06-10 DIAGNOSIS — N2581 Secondary hyperparathyroidism of renal origin: Secondary | ICD-10-CM | POA: Diagnosis not present

## 2015-06-12 DIAGNOSIS — N186 End stage renal disease: Secondary | ICD-10-CM | POA: Diagnosis not present

## 2015-06-12 DIAGNOSIS — Z992 Dependence on renal dialysis: Secondary | ICD-10-CM | POA: Diagnosis not present

## 2015-06-14 DIAGNOSIS — N186 End stage renal disease: Secondary | ICD-10-CM | POA: Diagnosis not present

## 2015-06-14 DIAGNOSIS — Z992 Dependence on renal dialysis: Secondary | ICD-10-CM | POA: Diagnosis not present

## 2015-06-17 DIAGNOSIS — D631 Anemia in chronic kidney disease: Secondary | ICD-10-CM | POA: Diagnosis not present

## 2015-06-17 DIAGNOSIS — N2581 Secondary hyperparathyroidism of renal origin: Secondary | ICD-10-CM | POA: Diagnosis not present

## 2015-06-17 DIAGNOSIS — N186 End stage renal disease: Secondary | ICD-10-CM | POA: Diagnosis not present

## 2015-06-17 DIAGNOSIS — Z992 Dependence on renal dialysis: Secondary | ICD-10-CM | POA: Diagnosis not present

## 2015-06-19 DIAGNOSIS — Z992 Dependence on renal dialysis: Secondary | ICD-10-CM | POA: Diagnosis not present

## 2015-06-19 DIAGNOSIS — N186 End stage renal disease: Secondary | ICD-10-CM | POA: Diagnosis not present

## 2015-06-21 DIAGNOSIS — N186 End stage renal disease: Secondary | ICD-10-CM | POA: Diagnosis not present

## 2015-06-21 DIAGNOSIS — Z992 Dependence on renal dialysis: Secondary | ICD-10-CM | POA: Diagnosis not present

## 2015-06-22 DIAGNOSIS — N186 End stage renal disease: Secondary | ICD-10-CM | POA: Diagnosis not present

## 2015-06-22 DIAGNOSIS — Z992 Dependence on renal dialysis: Secondary | ICD-10-CM | POA: Diagnosis not present

## 2015-06-24 DIAGNOSIS — N186 End stage renal disease: Secondary | ICD-10-CM | POA: Diagnosis not present

## 2015-06-24 DIAGNOSIS — Z992 Dependence on renal dialysis: Secondary | ICD-10-CM | POA: Diagnosis not present

## 2015-06-24 DIAGNOSIS — D631 Anemia in chronic kidney disease: Secondary | ICD-10-CM | POA: Diagnosis not present

## 2015-06-26 DIAGNOSIS — Z992 Dependence on renal dialysis: Secondary | ICD-10-CM | POA: Diagnosis not present

## 2015-06-26 DIAGNOSIS — N186 End stage renal disease: Secondary | ICD-10-CM | POA: Diagnosis not present

## 2015-06-28 DIAGNOSIS — Z992 Dependence on renal dialysis: Secondary | ICD-10-CM | POA: Diagnosis not present

## 2015-06-28 DIAGNOSIS — N186 End stage renal disease: Secondary | ICD-10-CM | POA: Diagnosis not present

## 2015-06-30 ENCOUNTER — Ambulatory Visit (INDEPENDENT_AMBULATORY_CARE_PROVIDER_SITE_OTHER): Payer: Commercial Managed Care - HMO | Admitting: *Deleted

## 2015-06-30 DIAGNOSIS — I4891 Unspecified atrial fibrillation: Secondary | ICD-10-CM | POA: Diagnosis not present

## 2015-06-30 DIAGNOSIS — Z5181 Encounter for therapeutic drug level monitoring: Secondary | ICD-10-CM | POA: Diagnosis not present

## 2015-06-30 LAB — POCT INR: INR: 2.3

## 2015-07-01 DIAGNOSIS — N2581 Secondary hyperparathyroidism of renal origin: Secondary | ICD-10-CM | POA: Diagnosis not present

## 2015-07-01 DIAGNOSIS — Z992 Dependence on renal dialysis: Secondary | ICD-10-CM | POA: Diagnosis not present

## 2015-07-01 DIAGNOSIS — N186 End stage renal disease: Secondary | ICD-10-CM | POA: Diagnosis not present

## 2015-07-03 DIAGNOSIS — N186 End stage renal disease: Secondary | ICD-10-CM | POA: Diagnosis not present

## 2015-07-03 DIAGNOSIS — Z992 Dependence on renal dialysis: Secondary | ICD-10-CM | POA: Diagnosis not present

## 2015-07-05 DIAGNOSIS — Z992 Dependence on renal dialysis: Secondary | ICD-10-CM | POA: Diagnosis not present

## 2015-07-05 DIAGNOSIS — N186 End stage renal disease: Secondary | ICD-10-CM | POA: Diagnosis not present

## 2015-07-08 DIAGNOSIS — Z992 Dependence on renal dialysis: Secondary | ICD-10-CM | POA: Diagnosis not present

## 2015-07-08 DIAGNOSIS — N186 End stage renal disease: Secondary | ICD-10-CM | POA: Diagnosis not present

## 2015-07-10 DIAGNOSIS — Z992 Dependence on renal dialysis: Secondary | ICD-10-CM | POA: Diagnosis not present

## 2015-07-10 DIAGNOSIS — N186 End stage renal disease: Secondary | ICD-10-CM | POA: Diagnosis not present

## 2015-07-12 DIAGNOSIS — N186 End stage renal disease: Secondary | ICD-10-CM | POA: Diagnosis not present

## 2015-07-12 DIAGNOSIS — Z992 Dependence on renal dialysis: Secondary | ICD-10-CM | POA: Diagnosis not present

## 2015-07-15 DIAGNOSIS — N186 End stage renal disease: Secondary | ICD-10-CM | POA: Diagnosis not present

## 2015-07-15 DIAGNOSIS — D631 Anemia in chronic kidney disease: Secondary | ICD-10-CM | POA: Diagnosis not present

## 2015-07-15 DIAGNOSIS — Z992 Dependence on renal dialysis: Secondary | ICD-10-CM | POA: Diagnosis not present

## 2015-07-17 DIAGNOSIS — Z992 Dependence on renal dialysis: Secondary | ICD-10-CM | POA: Diagnosis not present

## 2015-07-17 DIAGNOSIS — N186 End stage renal disease: Secondary | ICD-10-CM | POA: Diagnosis not present

## 2015-07-19 DIAGNOSIS — Z992 Dependence on renal dialysis: Secondary | ICD-10-CM | POA: Diagnosis not present

## 2015-07-19 DIAGNOSIS — N186 End stage renal disease: Secondary | ICD-10-CM | POA: Diagnosis not present

## 2015-07-22 DIAGNOSIS — Z992 Dependence on renal dialysis: Secondary | ICD-10-CM | POA: Diagnosis not present

## 2015-07-22 DIAGNOSIS — N186 End stage renal disease: Secondary | ICD-10-CM | POA: Diagnosis not present

## 2015-07-23 DIAGNOSIS — Z992 Dependence on renal dialysis: Secondary | ICD-10-CM | POA: Diagnosis not present

## 2015-07-23 DIAGNOSIS — N186 End stage renal disease: Secondary | ICD-10-CM | POA: Diagnosis not present

## 2015-07-24 DIAGNOSIS — N186 End stage renal disease: Secondary | ICD-10-CM | POA: Diagnosis not present

## 2015-07-24 DIAGNOSIS — Z992 Dependence on renal dialysis: Secondary | ICD-10-CM | POA: Diagnosis not present

## 2015-07-26 DIAGNOSIS — N186 End stage renal disease: Secondary | ICD-10-CM | POA: Diagnosis not present

## 2015-07-26 DIAGNOSIS — Z992 Dependence on renal dialysis: Secondary | ICD-10-CM | POA: Diagnosis not present

## 2015-07-29 DIAGNOSIS — Z992 Dependence on renal dialysis: Secondary | ICD-10-CM | POA: Diagnosis not present

## 2015-07-29 DIAGNOSIS — N186 End stage renal disease: Secondary | ICD-10-CM | POA: Diagnosis not present

## 2015-07-31 DIAGNOSIS — Z992 Dependence on renal dialysis: Secondary | ICD-10-CM | POA: Diagnosis not present

## 2015-07-31 DIAGNOSIS — N186 End stage renal disease: Secondary | ICD-10-CM | POA: Diagnosis not present

## 2015-08-01 ENCOUNTER — Other Ambulatory Visit: Payer: Self-pay | Admitting: Cardiology

## 2015-08-02 DIAGNOSIS — N186 End stage renal disease: Secondary | ICD-10-CM | POA: Diagnosis not present

## 2015-08-02 DIAGNOSIS — Z992 Dependence on renal dialysis: Secondary | ICD-10-CM | POA: Diagnosis not present

## 2015-08-04 ENCOUNTER — Encounter: Payer: Self-pay | Admitting: Cardiology

## 2015-08-04 ENCOUNTER — Ambulatory Visit (INDEPENDENT_AMBULATORY_CARE_PROVIDER_SITE_OTHER): Payer: Commercial Managed Care - HMO | Admitting: *Deleted

## 2015-08-04 ENCOUNTER — Ambulatory Visit (INDEPENDENT_AMBULATORY_CARE_PROVIDER_SITE_OTHER): Payer: Commercial Managed Care - HMO | Admitting: Cardiology

## 2015-08-04 VITALS — BP 130/62 | HR 91 | Ht 74.0 in | Wt 217.0 lb

## 2015-08-04 DIAGNOSIS — Z5181 Encounter for therapeutic drug level monitoring: Secondary | ICD-10-CM

## 2015-08-04 DIAGNOSIS — Z8679 Personal history of other diseases of the circulatory system: Secondary | ICD-10-CM | POA: Diagnosis not present

## 2015-08-04 DIAGNOSIS — I482 Chronic atrial fibrillation, unspecified: Secondary | ICD-10-CM

## 2015-08-04 DIAGNOSIS — I10 Essential (primary) hypertension: Secondary | ICD-10-CM

## 2015-08-04 DIAGNOSIS — I4891 Unspecified atrial fibrillation: Secondary | ICD-10-CM

## 2015-08-04 DIAGNOSIS — N186 End stage renal disease: Secondary | ICD-10-CM

## 2015-08-04 LAB — POCT INR: INR: 3.2

## 2015-08-04 NOTE — Progress Notes (Signed)
Cardiology Office Note  Date: 08/04/2015   ID: Albert [name], DOB 13-Sep-1955, MRN [id number]  PCP: Glo Herring., MD  Primary Cardiologist: Rozann Lesches, MD   Chief Complaint  Patient presents with  . Atrial Fibrillation    History of Present Illness: Albert [name] is a 60 y.o. male last seen in April 2016. He presents for a routine follow-up visit. Reports doing well without any palpitations or chest pain. He is on hemodialysis regularly and follows with Dr. Lowanda Foster.  He continues on Coumadin with follow-up in anticoagulation clinic. Due for follow-up INR today. He has had no bleeding problems and reports compliance with his medications. I reviewed his last ECG from January.  Echocardiogram from August 2015 showed LVEF 55%. He does not report any unusual weight gain, orthopnea, or PND.  Current medications include Coreg, Cardizem CD, Pravachol, and Coumadin.  Past Medical History  Diagnosis Date  . Mixed hyperlipidemia   . Atrial fibrillation ([place])   . Essential hypertension, benign   . Gout   . Diverticulosis of colon   . Colonic polyp   . Cardiomyopathy     LVEF 50-55% 8/11 - [facility]  . OSA (obstructive sleep apnea)     CPAP  . Dysrhythmia     afib  . Type 2 diabetes mellitus ([facility])     Type II  . Renal cell cancer ([place])   . ESRD on hemodialysis ([place])     Dr. [facility]anda Foster [facility]  . Anemia     Past Surgical History  Procedure Laterality Date  . Right nephrectomy    . Left arm av fistula  2009    Dr. [name]  . Fistulogram N/A 10/19/2013    Procedure: FISTULOGRAM;  Surgeon: Rosetta Posner, MD;  Location: [facility] CATH LAB;  Service: Cardiovascular;  Laterality: N/A;  . Peripheral vascular catheterization N/A 03/10/2015    Procedure: Fistulagram;  Surgeon: Conrad Nardin, MD;  Location: [facility] CV LAB;  Service: Cardiovascular;  Laterality: N/A;  . Colonoscopy w/ polypectomy    . Fistula superficialization Left 03/31/2015    Procedure: CEPHALIC VEIN  TURNDOWN; PLICATION OF BRACHIOCEPHALIC AVF;  Surgeon: Conrad Homeacre-Lyndora, MD;  Location: [facility];  Service: Vascular;  Laterality: Left;    Current Outpatient Prescriptions  Medication Sig Dispense Refill  . allopurinol (ZYLOPRIM) 100 MG tablet Take 100 mg by mouth daily.     . carvedilol (COREG) 25 MG tablet TAKE 1 TABLET TWICE DAILY WITH MEALS (Patient taking differently: TAKE 0.5 TABLET TWICE DAILY WITH MEALS) 180 tablet 3  . cinacalcet (SENSIPAR) 30 MG tablet Take 30 mg by mouth daily.    Marland Kitchen diltiazem (TIAZAC) 300 MG 24 hr capsule TAKE 1 CAPSULE EVERY DAY 90 capsule 2  . doxazosin (CARDURA) 2 MG tablet Take 2 mg by mouth daily.    . fenofibrate 160 MG tablet Take 160 mg by mouth daily.    . fish oil-omega-3 fatty acids 1000 MG capsule Take 1 g by mouth 3 (three) times daily.     Marland Kitchen linagliptin (TRADJENTA) 5 MG TABS tablet Take 5 mg by mouth daily.    Marland Kitchen oxyCODONE-acetaminophen (ROXICET) 5-325 MG tablet Take 1-2 tablets by mouth every 6 (six) hours as needed. 30 tablet 0  . pravastatin (PRAVACHOL) 10 MG tablet Take 5 mg by mouth daily.     . sevelamer carbonate (RENVELA) 800 MG tablet Take 800 mg by mouth as directed. Take 2 tablets after each meal and 1 after each snack  (  8 tablets daily)    . Tetrahydrozoline HCl (VISINE OP) Place 2 drops into both eyes daily as needed (dry eyes).    . warfarin (COUMADIN) 2.5 MG tablet Take 1 1/2 tablets daily 135 tablet 3   No current facility-administered medications for this visit.   Allergies:  Review of patient's allergies indicates no known allergies.   Social History: The patient  reports that he quit smoking about 17 years ago. His smoking use included Cigarettes. He has never used smokeless tobacco. He reports that he does not drink alcohol or use illicit drugs.   ROS:  Please see the history of present illness. Otherwise, complete review of systems is positive for none.  All other systems are reviewed and negative.   Physical Exam: VS:  BP 130/62 mmHg   Pulse 91  Ht 6\' 2"  (1.88 m)  Wt 217 lb (98.431 kg)  BMI 27.85 kg/m2  SpO2 95%, BMI Body mass index is 27.85 kg/(m^2).  Wt Readings from Last 3 Encounters:  08/04/15 217 lb (98.431 kg)  05/02/15 216 lb (97.977 kg)  03/31/15 215 lb (97.523 kg)    Appears comfortable at rest.  HEENT: Conjunctiva and lids normal, oropharynx clear with moist mucosa.  Neck: Supple, no elevated JVP or carotid bruits, no thyromegaly.  Lungs: Clear to auscultation, nonlabored breathing at rest.  Cardiac: Irregularly irregular, no S3, soft systolic murmur, no pericardial rub.  Abdomen: Soft, nontender, bowel sounds present, no guarding or rebound.  Extremities: Left arm AV fistula, no pitting edema, distal pulses 1-2+.  Skin: Warm and dry.  Musculoskeletal: No kyphosis.  Neuropsychiatric: Alert and oriented x3, affect grossly appropriate.  ECG: I personally reviewed the tracing from 03/10/2015 which showed rate-controlled atrial fibrillation with nonspecific T-wave changes.  Recent Labwork: 03/10/2015: BUN 80*; Creatinine, Ser 8.30* 03/31/2015: Hemoglobin 11.9*; Potassium 4.2; Sodium 142   Other Studies Reviewed Today:  Echocardiogram 10/12/2013: Study Conclusions  - Left ventricle: The cavity size was normal. Wall thickness was increased in a pattern of mild LVH. The estimated ejection fraction was 55%. Regional wall motion abnormalities cannot be excluded. - Left atrium: The atrium was moderately to severely dilated. - Right ventricle: The cavity size was mildly dilated. Systolic function was mildly reduced. - Right atrium: The atrium was mildly dilated. - Pulmonary arteries: PA peak pressure: 34 mm Hg (S).  Assessment and Plan:  1. History of nonischemic cardiomyopathy with normalization of LVEF, continue medical therapy and observation. We will obtain a follow-up echocardiogram prior to his next visit in one year.  2. Chronic atrial fibrillation, asymptomatic. He continues on,  patient of Coreg and Cardizem CD for heart rate control and has tolerated Coumadin long-term. Keep follow-up in the anticoagulation clinic on a regular basis.  3. End-stage renal disease on hemodialysis. The ports no major issues with blood pressure during these sessions.  4. Essential hypertension, blood pressure control is adequate today.  Current medicines were reviewed with the patient today.  Disposition: FU with me in 1 year.   Signed, [name], MD, [facility] 08/04/2015 8:59 AM    [facility] at [facility] [address], [place],  [address] Phone: [phone]; Fax: [phone]

## 2015-08-04 NOTE — Patient Instructions (Signed)
Your physician wants you to follow-up in:  1 year You will receive a reminder letter in the mail two months in advance. If you don't receive a letter, please call our office to schedule the follow-up appointment.  Your physician recommends that you continue on your current medications as directed. Please refer to the Current Medication list given to you today.    Your physician has requested that you have an echocardiogram. Echocardiography is a painless test that uses sound waves to create images of your heart. It provides your doctor with information about the size and shape of your heart and how well your heart's chambers and valves are working. This procedure takes approximately one hour. There are no restrictions for this procedure.  JUST BEFORE NEXT VISIT IN A YEAR      Thank you for choosing [facility] !

## 2015-08-05 DIAGNOSIS — Z992 Dependence on renal dialysis: Secondary | ICD-10-CM | POA: Diagnosis not present

## 2015-08-05 DIAGNOSIS — N186 End stage renal disease: Secondary | ICD-10-CM | POA: Diagnosis not present

## 2015-08-05 DIAGNOSIS — N2581 Secondary hyperparathyroidism of renal origin: Secondary | ICD-10-CM | POA: Diagnosis not present

## 2015-08-07 DIAGNOSIS — N186 End stage renal disease: Secondary | ICD-10-CM | POA: Diagnosis not present

## 2015-08-07 DIAGNOSIS — Z992 Dependence on renal dialysis: Secondary | ICD-10-CM | POA: Diagnosis not present

## 2015-08-09 DIAGNOSIS — N186 End stage renal disease: Secondary | ICD-10-CM | POA: Diagnosis not present

## 2015-08-09 DIAGNOSIS — Z992 Dependence on renal dialysis: Secondary | ICD-10-CM | POA: Diagnosis not present

## 2015-08-12 DIAGNOSIS — Z992 Dependence on renal dialysis: Secondary | ICD-10-CM | POA: Diagnosis not present

## 2015-08-12 DIAGNOSIS — N186 End stage renal disease: Secondary | ICD-10-CM | POA: Diagnosis not present

## 2015-08-14 DIAGNOSIS — Z992 Dependence on renal dialysis: Secondary | ICD-10-CM | POA: Diagnosis not present

## 2015-08-14 DIAGNOSIS — N186 End stage renal disease: Secondary | ICD-10-CM | POA: Diagnosis not present

## 2015-08-15 DIAGNOSIS — Z6827 Body mass index (BMI) 27.0-27.9, adult: Secondary | ICD-10-CM | POA: Diagnosis not present

## 2015-08-15 DIAGNOSIS — E663 Overweight: Secondary | ICD-10-CM | POA: Diagnosis not present

## 2015-08-15 DIAGNOSIS — Z0000 Encounter for general adult medical examination without abnormal findings: Secondary | ICD-10-CM | POA: Diagnosis not present

## 2015-08-16 DIAGNOSIS — N186 End stage renal disease: Secondary | ICD-10-CM | POA: Diagnosis not present

## 2015-08-16 DIAGNOSIS — Z992 Dependence on renal dialysis: Secondary | ICD-10-CM | POA: Diagnosis not present

## 2015-08-19 DIAGNOSIS — Z992 Dependence on renal dialysis: Secondary | ICD-10-CM | POA: Diagnosis not present

## 2015-08-19 DIAGNOSIS — N186 End stage renal disease: Secondary | ICD-10-CM | POA: Diagnosis not present

## 2015-08-21 DIAGNOSIS — Z992 Dependence on renal dialysis: Secondary | ICD-10-CM | POA: Diagnosis not present

## 2015-08-21 DIAGNOSIS — N186 End stage renal disease: Secondary | ICD-10-CM | POA: Diagnosis not present

## 2015-08-22 DIAGNOSIS — Z992 Dependence on renal dialysis: Secondary | ICD-10-CM | POA: Diagnosis not present

## 2015-08-22 DIAGNOSIS — N186 End stage renal disease: Secondary | ICD-10-CM | POA: Diagnosis not present

## 2015-08-23 DIAGNOSIS — Z992 Dependence on renal dialysis: Secondary | ICD-10-CM | POA: Diagnosis not present

## 2015-08-23 DIAGNOSIS — N186 End stage renal disease: Secondary | ICD-10-CM | POA: Diagnosis not present

## 2015-08-25 DIAGNOSIS — E663 Overweight: Secondary | ICD-10-CM | POA: Diagnosis not present

## 2015-08-25 DIAGNOSIS — Z6828 Body mass index (BMI) 28.0-28.9, adult: Secondary | ICD-10-CM | POA: Diagnosis not present

## 2015-08-25 DIAGNOSIS — Z713 Dietary counseling and surveillance: Secondary | ICD-10-CM | POA: Diagnosis not present

## 2015-08-25 DIAGNOSIS — E782 Mixed hyperlipidemia: Secondary | ICD-10-CM | POA: Diagnosis not present

## 2015-08-25 DIAGNOSIS — E1121 Type 2 diabetes mellitus with diabetic nephropathy: Secondary | ICD-10-CM | POA: Diagnosis not present

## 2015-08-25 DIAGNOSIS — E119 Type 2 diabetes mellitus without complications: Secondary | ICD-10-CM | POA: Diagnosis not present

## 2015-08-26 DIAGNOSIS — N186 End stage renal disease: Secondary | ICD-10-CM | POA: Diagnosis not present

## 2015-08-26 DIAGNOSIS — Z992 Dependence on renal dialysis: Secondary | ICD-10-CM | POA: Diagnosis not present

## 2015-08-27 ENCOUNTER — Ambulatory Visit (INDEPENDENT_AMBULATORY_CARE_PROVIDER_SITE_OTHER): Payer: Commercial Managed Care - HMO | Admitting: *Deleted

## 2015-08-27 DIAGNOSIS — Z5181 Encounter for therapeutic drug level monitoring: Secondary | ICD-10-CM

## 2015-08-27 DIAGNOSIS — I4891 Unspecified atrial fibrillation: Secondary | ICD-10-CM | POA: Diagnosis not present

## 2015-08-27 LAB — POCT INR: INR: 3.2

## 2015-08-28 DIAGNOSIS — N186 End stage renal disease: Secondary | ICD-10-CM | POA: Diagnosis not present

## 2015-08-28 DIAGNOSIS — Z992 Dependence on renal dialysis: Secondary | ICD-10-CM | POA: Diagnosis not present

## 2015-08-30 DIAGNOSIS — Z992 Dependence on renal dialysis: Secondary | ICD-10-CM | POA: Diagnosis not present

## 2015-08-30 DIAGNOSIS — N186 End stage renal disease: Secondary | ICD-10-CM | POA: Diagnosis not present

## 2015-09-02 DIAGNOSIS — E119 Type 2 diabetes mellitus without complications: Secondary | ICD-10-CM | POA: Diagnosis not present

## 2015-09-02 DIAGNOSIS — N186 End stage renal disease: Secondary | ICD-10-CM | POA: Diagnosis not present

## 2015-09-02 DIAGNOSIS — Z992 Dependence on renal dialysis: Secondary | ICD-10-CM | POA: Diagnosis not present

## 2015-09-04 DIAGNOSIS — N186 End stage renal disease: Secondary | ICD-10-CM | POA: Diagnosis not present

## 2015-09-04 DIAGNOSIS — Z992 Dependence on renal dialysis: Secondary | ICD-10-CM | POA: Diagnosis not present

## 2015-09-06 DIAGNOSIS — N186 End stage renal disease: Secondary | ICD-10-CM | POA: Diagnosis not present

## 2015-09-06 DIAGNOSIS — Z992 Dependence on renal dialysis: Secondary | ICD-10-CM | POA: Diagnosis not present

## 2015-09-09 DIAGNOSIS — Z992 Dependence on renal dialysis: Secondary | ICD-10-CM | POA: Diagnosis not present

## 2015-09-09 DIAGNOSIS — N186 End stage renal disease: Secondary | ICD-10-CM | POA: Diagnosis not present

## 2015-09-11 DIAGNOSIS — Z992 Dependence on renal dialysis: Secondary | ICD-10-CM | POA: Diagnosis not present

## 2015-09-11 DIAGNOSIS — N186 End stage renal disease: Secondary | ICD-10-CM | POA: Diagnosis not present

## 2015-09-13 DIAGNOSIS — Z992 Dependence on renal dialysis: Secondary | ICD-10-CM | POA: Diagnosis not present

## 2015-09-13 DIAGNOSIS — N186 End stage renal disease: Secondary | ICD-10-CM | POA: Diagnosis not present

## 2015-09-16 DIAGNOSIS — Z992 Dependence on renal dialysis: Secondary | ICD-10-CM | POA: Diagnosis not present

## 2015-09-16 DIAGNOSIS — N186 End stage renal disease: Secondary | ICD-10-CM | POA: Diagnosis not present

## 2015-09-17 ENCOUNTER — Ambulatory Visit (INDEPENDENT_AMBULATORY_CARE_PROVIDER_SITE_OTHER): Payer: Commercial Managed Care - HMO | Admitting: *Deleted

## 2015-09-17 DIAGNOSIS — I4891 Unspecified atrial fibrillation: Secondary | ICD-10-CM | POA: Diagnosis not present

## 2015-09-17 DIAGNOSIS — Z5181 Encounter for therapeutic drug level monitoring: Secondary | ICD-10-CM | POA: Diagnosis not present

## 2015-09-17 LAB — POCT INR: INR: 2.5

## 2015-09-18 DIAGNOSIS — N186 End stage renal disease: Secondary | ICD-10-CM | POA: Diagnosis not present

## 2015-09-18 DIAGNOSIS — Z992 Dependence on renal dialysis: Secondary | ICD-10-CM | POA: Diagnosis not present

## 2015-09-20 DIAGNOSIS — N186 End stage renal disease: Secondary | ICD-10-CM | POA: Diagnosis not present

## 2015-09-20 DIAGNOSIS — Z992 Dependence on renal dialysis: Secondary | ICD-10-CM | POA: Diagnosis not present

## 2015-09-22 DIAGNOSIS — N186 End stage renal disease: Secondary | ICD-10-CM | POA: Diagnosis not present

## 2015-09-22 DIAGNOSIS — Z992 Dependence on renal dialysis: Secondary | ICD-10-CM | POA: Diagnosis not present

## 2015-09-23 DIAGNOSIS — Z992 Dependence on renal dialysis: Secondary | ICD-10-CM | POA: Diagnosis not present

## 2015-09-23 DIAGNOSIS — N186 End stage renal disease: Secondary | ICD-10-CM | POA: Diagnosis not present

## 2015-09-25 DIAGNOSIS — N186 End stage renal disease: Secondary | ICD-10-CM | POA: Diagnosis not present

## 2015-09-25 DIAGNOSIS — Z992 Dependence on renal dialysis: Secondary | ICD-10-CM | POA: Diagnosis not present

## 2015-09-27 DIAGNOSIS — Z992 Dependence on renal dialysis: Secondary | ICD-10-CM | POA: Diagnosis not present

## 2015-09-27 DIAGNOSIS — N186 End stage renal disease: Secondary | ICD-10-CM | POA: Diagnosis not present

## 2015-09-30 DIAGNOSIS — Z992 Dependence on renal dialysis: Secondary | ICD-10-CM | POA: Diagnosis not present

## 2015-09-30 DIAGNOSIS — N186 End stage renal disease: Secondary | ICD-10-CM | POA: Diagnosis not present

## 2015-10-02 DIAGNOSIS — Z992 Dependence on renal dialysis: Secondary | ICD-10-CM | POA: Diagnosis not present

## 2015-10-02 DIAGNOSIS — N186 End stage renal disease: Secondary | ICD-10-CM | POA: Diagnosis not present

## 2015-10-04 DIAGNOSIS — N186 End stage renal disease: Secondary | ICD-10-CM | POA: Diagnosis not present

## 2015-10-04 DIAGNOSIS — Z992 Dependence on renal dialysis: Secondary | ICD-10-CM | POA: Diagnosis not present

## 2015-10-07 DIAGNOSIS — Z992 Dependence on renal dialysis: Secondary | ICD-10-CM | POA: Diagnosis not present

## 2015-10-07 DIAGNOSIS — N186 End stage renal disease: Secondary | ICD-10-CM | POA: Diagnosis not present

## 2015-10-09 DIAGNOSIS — Z992 Dependence on renal dialysis: Secondary | ICD-10-CM | POA: Diagnosis not present

## 2015-10-09 DIAGNOSIS — N186 End stage renal disease: Secondary | ICD-10-CM | POA: Diagnosis not present

## 2015-10-11 DIAGNOSIS — Z992 Dependence on renal dialysis: Secondary | ICD-10-CM | POA: Diagnosis not present

## 2015-10-11 DIAGNOSIS — N186 End stage renal disease: Secondary | ICD-10-CM | POA: Diagnosis not present

## 2015-10-14 DIAGNOSIS — Z992 Dependence on renal dialysis: Secondary | ICD-10-CM | POA: Diagnosis not present

## 2015-10-14 DIAGNOSIS — N186 End stage renal disease: Secondary | ICD-10-CM | POA: Diagnosis not present

## 2015-10-15 ENCOUNTER — Ambulatory Visit (INDEPENDENT_AMBULATORY_CARE_PROVIDER_SITE_OTHER): Payer: Commercial Managed Care - HMO | Admitting: *Deleted

## 2015-10-15 DIAGNOSIS — Z5181 Encounter for therapeutic drug level monitoring: Secondary | ICD-10-CM | POA: Diagnosis not present

## 2015-10-15 DIAGNOSIS — I4891 Unspecified atrial fibrillation: Secondary | ICD-10-CM

## 2015-10-15 LAB — POCT INR: INR: 2.6

## 2015-10-16 DIAGNOSIS — Z992 Dependence on renal dialysis: Secondary | ICD-10-CM | POA: Diagnosis not present

## 2015-10-16 DIAGNOSIS — N186 End stage renal disease: Secondary | ICD-10-CM | POA: Diagnosis not present

## 2015-10-18 DIAGNOSIS — N186 End stage renal disease: Secondary | ICD-10-CM | POA: Diagnosis not present

## 2015-10-18 DIAGNOSIS — Z992 Dependence on renal dialysis: Secondary | ICD-10-CM | POA: Diagnosis not present

## 2015-10-21 DIAGNOSIS — N186 End stage renal disease: Secondary | ICD-10-CM | POA: Diagnosis not present

## 2015-10-21 DIAGNOSIS — Z992 Dependence on renal dialysis: Secondary | ICD-10-CM | POA: Diagnosis not present

## 2015-10-23 DIAGNOSIS — Z992 Dependence on renal dialysis: Secondary | ICD-10-CM | POA: Diagnosis not present

## 2015-10-23 DIAGNOSIS — N186 End stage renal disease: Secondary | ICD-10-CM | POA: Diagnosis not present

## 2015-10-25 DIAGNOSIS — Z992 Dependence on renal dialysis: Secondary | ICD-10-CM | POA: Diagnosis not present

## 2015-10-25 DIAGNOSIS — Z23 Encounter for immunization: Secondary | ICD-10-CM | POA: Diagnosis not present

## 2015-10-25 DIAGNOSIS — N186 End stage renal disease: Secondary | ICD-10-CM | POA: Diagnosis not present

## 2015-10-28 DIAGNOSIS — Z23 Encounter for immunization: Secondary | ICD-10-CM | POA: Diagnosis not present

## 2015-10-28 DIAGNOSIS — N186 End stage renal disease: Secondary | ICD-10-CM | POA: Diagnosis not present

## 2015-10-28 DIAGNOSIS — Z992 Dependence on renal dialysis: Secondary | ICD-10-CM | POA: Diagnosis not present

## 2015-10-30 DIAGNOSIS — Z992 Dependence on renal dialysis: Secondary | ICD-10-CM | POA: Diagnosis not present

## 2015-10-30 DIAGNOSIS — Z23 Encounter for immunization: Secondary | ICD-10-CM | POA: Diagnosis not present

## 2015-10-30 DIAGNOSIS — N186 End stage renal disease: Secondary | ICD-10-CM | POA: Diagnosis not present

## 2015-11-01 DIAGNOSIS — Z992 Dependence on renal dialysis: Secondary | ICD-10-CM | POA: Diagnosis not present

## 2015-11-01 DIAGNOSIS — N186 End stage renal disease: Secondary | ICD-10-CM | POA: Diagnosis not present

## 2015-11-01 DIAGNOSIS — Z23 Encounter for immunization: Secondary | ICD-10-CM | POA: Diagnosis not present

## 2015-11-03 DIAGNOSIS — N186 End stage renal disease: Secondary | ICD-10-CM | POA: Diagnosis not present

## 2015-11-03 DIAGNOSIS — Z23 Encounter for immunization: Secondary | ICD-10-CM | POA: Diagnosis not present

## 2015-11-03 DIAGNOSIS — Z992 Dependence on renal dialysis: Secondary | ICD-10-CM | POA: Diagnosis not present

## 2015-11-06 DIAGNOSIS — N186 End stage renal disease: Secondary | ICD-10-CM | POA: Diagnosis not present

## 2015-11-06 DIAGNOSIS — Z23 Encounter for immunization: Secondary | ICD-10-CM | POA: Diagnosis not present

## 2015-11-06 DIAGNOSIS — Z992 Dependence on renal dialysis: Secondary | ICD-10-CM | POA: Diagnosis not present

## 2015-11-08 DIAGNOSIS — Z992 Dependence on renal dialysis: Secondary | ICD-10-CM | POA: Diagnosis not present

## 2015-11-08 DIAGNOSIS — N186 End stage renal disease: Secondary | ICD-10-CM | POA: Diagnosis not present

## 2015-11-08 DIAGNOSIS — Z23 Encounter for immunization: Secondary | ICD-10-CM | POA: Diagnosis not present

## 2015-11-11 DIAGNOSIS — N186 End stage renal disease: Secondary | ICD-10-CM | POA: Diagnosis not present

## 2015-11-11 DIAGNOSIS — Z23 Encounter for immunization: Secondary | ICD-10-CM | POA: Diagnosis not present

## 2015-11-11 DIAGNOSIS — Z992 Dependence on renal dialysis: Secondary | ICD-10-CM | POA: Diagnosis not present

## 2015-11-13 DIAGNOSIS — N186 End stage renal disease: Secondary | ICD-10-CM | POA: Diagnosis not present

## 2015-11-13 DIAGNOSIS — Z23 Encounter for immunization: Secondary | ICD-10-CM | POA: Diagnosis not present

## 2015-11-13 DIAGNOSIS — Z992 Dependence on renal dialysis: Secondary | ICD-10-CM | POA: Diagnosis not present

## 2015-11-15 DIAGNOSIS — Z23 Encounter for immunization: Secondary | ICD-10-CM | POA: Diagnosis not present

## 2015-11-15 DIAGNOSIS — N186 End stage renal disease: Secondary | ICD-10-CM | POA: Diagnosis not present

## 2015-11-15 DIAGNOSIS — Z992 Dependence on renal dialysis: Secondary | ICD-10-CM | POA: Diagnosis not present

## 2015-11-18 DIAGNOSIS — N186 End stage renal disease: Secondary | ICD-10-CM | POA: Diagnosis not present

## 2015-11-18 DIAGNOSIS — Z23 Encounter for immunization: Secondary | ICD-10-CM | POA: Diagnosis not present

## 2015-11-18 DIAGNOSIS — Z992 Dependence on renal dialysis: Secondary | ICD-10-CM | POA: Diagnosis not present

## 2015-11-19 ENCOUNTER — Ambulatory Visit (INDEPENDENT_AMBULATORY_CARE_PROVIDER_SITE_OTHER): Payer: Commercial Managed Care - HMO | Admitting: *Deleted

## 2015-11-19 DIAGNOSIS — I4891 Unspecified atrial fibrillation: Secondary | ICD-10-CM | POA: Diagnosis not present

## 2015-11-19 DIAGNOSIS — Z5181 Encounter for therapeutic drug level monitoring: Secondary | ICD-10-CM

## 2015-11-19 LAB — POCT INR: INR: 2.1

## 2015-11-20 DIAGNOSIS — Z992 Dependence on renal dialysis: Secondary | ICD-10-CM | POA: Diagnosis not present

## 2015-11-20 DIAGNOSIS — Z23 Encounter for immunization: Secondary | ICD-10-CM | POA: Diagnosis not present

## 2015-11-20 DIAGNOSIS — N186 End stage renal disease: Secondary | ICD-10-CM | POA: Diagnosis not present

## 2015-11-22 DIAGNOSIS — Z992 Dependence on renal dialysis: Secondary | ICD-10-CM | POA: Diagnosis not present

## 2015-11-22 DIAGNOSIS — Z23 Encounter for immunization: Secondary | ICD-10-CM | POA: Diagnosis not present

## 2015-11-22 DIAGNOSIS — N186 End stage renal disease: Secondary | ICD-10-CM | POA: Diagnosis not present

## 2015-11-24 ENCOUNTER — Other Ambulatory Visit (HOSPITAL_COMMUNITY): Payer: Self-pay | Admitting: Registered Nurse

## 2015-11-24 DIAGNOSIS — N644 Mastodynia: Secondary | ICD-10-CM | POA: Diagnosis not present

## 2015-11-24 DIAGNOSIS — E782 Mixed hyperlipidemia: Secondary | ICD-10-CM | POA: Diagnosis not present

## 2015-11-24 DIAGNOSIS — I10 Essential (primary) hypertension: Secondary | ICD-10-CM | POA: Diagnosis not present

## 2015-11-24 DIAGNOSIS — Z6827 Body mass index (BMI) 27.0-27.9, adult: Secondary | ICD-10-CM | POA: Diagnosis not present

## 2015-11-25 ENCOUNTER — Ambulatory Visit (HOSPITAL_COMMUNITY)
Admission: RE | Admit: 2015-11-25 | Discharge: 2015-11-25 | Disposition: A | Discharge status: Home / Self Care | Payer: Commercial Managed Care - HMO | Source: Ambulatory Visit | Attending: Registered Nurse | Admitting: Registered Nurse

## 2015-11-25 DIAGNOSIS — R921 Mammographic calcification found on diagnostic imaging of breast: Secondary | ICD-10-CM | POA: Diagnosis not present

## 2015-11-25 DIAGNOSIS — Z992 Dependence on renal dialysis: Secondary | ICD-10-CM | POA: Diagnosis not present

## 2015-11-25 DIAGNOSIS — N644 Mastodynia: Secondary | ICD-10-CM

## 2015-11-25 DIAGNOSIS — N6489 Other specified disorders of breast: Secondary | ICD-10-CM | POA: Diagnosis not present

## 2015-11-25 DIAGNOSIS — N186 End stage renal disease: Secondary | ICD-10-CM | POA: Diagnosis not present

## 2015-11-27 DIAGNOSIS — Z992 Dependence on renal dialysis: Secondary | ICD-10-CM | POA: Diagnosis not present

## 2015-11-27 DIAGNOSIS — N186 End stage renal disease: Secondary | ICD-10-CM | POA: Diagnosis not present

## 2015-11-29 DIAGNOSIS — Z992 Dependence on renal dialysis: Secondary | ICD-10-CM | POA: Diagnosis not present

## 2015-11-29 DIAGNOSIS — N186 End stage renal disease: Secondary | ICD-10-CM | POA: Diagnosis not present

## 2015-12-02 DIAGNOSIS — N186 End stage renal disease: Secondary | ICD-10-CM | POA: Diagnosis not present

## 2015-12-02 DIAGNOSIS — Z992 Dependence on renal dialysis: Secondary | ICD-10-CM | POA: Diagnosis not present

## 2015-12-04 DIAGNOSIS — Z992 Dependence on renal dialysis: Secondary | ICD-10-CM | POA: Diagnosis not present

## 2015-12-04 DIAGNOSIS — N186 End stage renal disease: Secondary | ICD-10-CM | POA: Diagnosis not present

## 2015-12-06 DIAGNOSIS — N186 End stage renal disease: Secondary | ICD-10-CM | POA: Diagnosis not present

## 2015-12-06 DIAGNOSIS — Z992 Dependence on renal dialysis: Secondary | ICD-10-CM | POA: Diagnosis not present

## 2015-12-09 DIAGNOSIS — Z992 Dependence on renal dialysis: Secondary | ICD-10-CM | POA: Diagnosis not present

## 2015-12-09 DIAGNOSIS — N186 End stage renal disease: Secondary | ICD-10-CM | POA: Diagnosis not present

## 2015-12-11 DIAGNOSIS — Z992 Dependence on renal dialysis: Secondary | ICD-10-CM | POA: Diagnosis not present

## 2015-12-11 DIAGNOSIS — N186 End stage renal disease: Secondary | ICD-10-CM | POA: Diagnosis not present

## 2015-12-13 DIAGNOSIS — Z992 Dependence on renal dialysis: Secondary | ICD-10-CM | POA: Diagnosis not present

## 2015-12-13 DIAGNOSIS — N186 End stage renal disease: Secondary | ICD-10-CM | POA: Diagnosis not present

## 2015-12-16 DIAGNOSIS — N186 End stage renal disease: Secondary | ICD-10-CM | POA: Diagnosis not present

## 2015-12-16 DIAGNOSIS — Z992 Dependence on renal dialysis: Secondary | ICD-10-CM | POA: Diagnosis not present

## 2015-12-17 ENCOUNTER — Telehealth: Payer: Self-pay

## 2015-12-17 ENCOUNTER — Encounter: Payer: Self-pay | Admitting: Nurse Practitioner

## 2015-12-17 ENCOUNTER — Ambulatory Visit: Payer: Commercial Managed Care - HMO | Admitting: Nurse Practitioner

## 2015-12-17 ENCOUNTER — Ambulatory Visit (INDEPENDENT_AMBULATORY_CARE_PROVIDER_SITE_OTHER): Payer: Commercial Managed Care - HMO | Admitting: Nurse Practitioner

## 2015-12-17 DIAGNOSIS — Z86010 Personal history of colonic polyps: Secondary | ICD-10-CM | POA: Insufficient documentation

## 2015-12-17 DIAGNOSIS — K625 Hemorrhage of anus and rectum: Secondary | ICD-10-CM | POA: Diagnosis not present

## 2015-12-17 DIAGNOSIS — K5900 Constipation, unspecified: Secondary | ICD-10-CM | POA: Insufficient documentation

## 2015-12-17 NOTE — Telephone Encounter (Signed)
Forwarding to Ginger to complete the scheduling.

## 2015-12-17 NOTE — Assessment & Plan Note (Addendum)
The patient notes scant toilet tissue hematochezia, occasionally a little heavier, essentially with every bowel movement in the setting of constipation and anticoagulation on Coumadin for atrial fibrillation. Likely benign anorectal source but cannot rule out more insidious pathology and he is currently due for surveillance colonoscopy. We will proceed with colonoscopy as recommended and will obtain clearance from cardiology to hold Coumadin for 4 days and query whether Lovenox bridge is recommended.  Proceed with TCS with Dr. [name] in near future: the risks, benefits, and alternatives have been discussed with the patient in detail. The patient states understanding and desires to proceed.  The patient is on Coumadin. He is not on any other anticoagulants, anxiolytics, chronic pain medications, or antidepressants. Conscious sedation should work adequate for his procedure as it did for his last procedure.  Will need split prep dosing, TAP WATER ENEMA (not Fleets) due to ESRD on hemodialysis.

## 2015-12-17 NOTE — Telephone Encounter (Signed)
I have faxed request to Dr. Domenic Polite in reference to holding the coumadin for 4 days prior to procedure and if Lovenox Bridge will be needed.

## 2015-12-17 NOTE — Patient Instructions (Signed)
1. You can try Colace once a day stool softener for constipation. 2. We will schedule your procedure for you. 3. We will contact Dr. Domenic Polite for clearance to hold your Coumadin prior to the procedure. 4. We will contact you with further details.

## 2015-12-17 NOTE — Assessment & Plan Note (Signed)
The patient complains of intermittent constipation. He is on a fluid restriction due to end-stage renal disease and hemodialysis. He could try Colace stool softener daily. Can also discuss other options with nephrology. Return for follow-up as recommended after procedure.

## 2015-12-17 NOTE — Assessment & Plan Note (Signed)
Personal history of colon polyps. Is currently due for recommended 5 year surveillance colonoscopy. We will proceed with colonoscopy as noted above. Return for follow-up based on postprocedure recommendations.

## 2015-12-17 NOTE — Telephone Encounter (Signed)
Ok to hold coumadin 4 days prior to procedure.  Does not need bridging with Lovenox.  Resume coumadin night of procedure if OK with MD.  Pt needs INR check 7-10 days after procedure.  Have him call office for appt when procedure date has been scheduled. Thanks, Lattie Haw

## 2015-12-17 NOTE — Progress Notes (Signed)
Primary Care Physician:  Glo Herring., MD Primary Gastroenterologist:  Dr. [name]  Chief Complaint  Patient presents with  . Colonoscopy    hx of polyps, last TCS 5 yrs ago, blood when wipes x 69months, constipation at times    HPI:   Albert [name] is a 60 y.o. male who presents to schedule colonoscopy and was brought into the office for scheduling due to use of Coumadin. Last colonoscopy 05/04/2010 for personal history of colon polyps which found normal rectum, few scattered pancolonic diverticula, single diminutive polyp in the distal transverse colon. Surgical pathology found the polyp to be hyperplastic. recommended repeat colonoscopy in 5 years, currently due.  PCP notes reviewed. Last hemoglobin and records dated 08/12/2014 which found anemia with a hemoglobin of 11.3.  Today he states he's doing well overall. Dr. Domenic Polite manages his coumadin. Denies abdominal pain, N/V. Has had toilet tissue hematochezia with nearly every bowel movement, occasionally heavier bleeding. Has intermittent constipation but doesn't use any medications for it. Eats "quite a lot" of fiber, is on a fluid restriction due to Dialysis. Denies melena, unintentional weight loss, fever, chills, acute changes in bowel habits. Has an "upset stomach" now and then, which typically happens after Renvela and thinks that's a side effect from that medication. Denies chest pain, dyspnea, dizziness, lightheadedness, syncope, near syncope. Denies any other upper or lower GI symptoms.  Past Medical History:  Diagnosis Date  . Anemia   . Atrial fibrillation ([place])   . Cardiomyopathy    LVEF 50-55% 8/11 - [facility]  . Colonic polyp   . Diverticulosis of colon   . Dysrhythmia    afib  . ESRD on hemodialysis ([place])    Dr. Lowanda Foster [facility]  . Essential hypertension, benign   . Gout   . Mixed hyperlipidemia   . OSA (obstructive sleep apnea)    CPAP  . Renal cell cancer ([place])   . Type 2 diabetes mellitus ([place])    Type II    Past Surgical History:  Procedure Laterality Date  . COLONOSCOPY W/ POLYPECTOMY    . FISTULA SUPERFICIALIZATION Left 03/31/2015   Procedure: CEPHALIC VEIN TURNDOWN; PLICATION OF BRACHIOCEPHALIC AVF;  Surgeon: Conrad Ozona, MD;  Location: [place];  Service: Vascular;  Laterality: Left;  . FISTULOGRAM N/A 10/19/2013   Procedure: FISTULOGRAM;  Surgeon: Rosetta Posner, MD;  Location: [facility] CATH LAB;  Service: Cardiovascular;  Laterality: N/A;  . Left arm AV fistula  2009   Dr. [name]  . PERIPHERAL VASCULAR CATHETERIZATION N/A 03/10/2015   Procedure: Fistulagram;  Surgeon: Conrad Lockhart, MD;  Location: [facility] CV LAB;  Service: Cardiovascular;  Laterality: N/A;  . Right nephrectomy      Current Outpatient Prescriptions  Medication Sig Dispense Refill  . allopurinol (ZYLOPRIM) 100 MG tablet Take 100 mg by mouth daily.     . carvedilol (COREG) 25 MG tablet TAKE 1 TABLET TWICE DAILY WITH MEALS (Patient taking differently: TAKE 0.5 TABLET TWICE DAILY WITH MEALS) 180 tablet 3  . cinacalcet (SENSIPAR) 30 MG tablet Take 30 mg by mouth daily.    Marland Kitchen diltiazem (TIAZAC) 300 MG 24 hr capsule TAKE 1 CAPSULE EVERY DAY 90 capsule 2  . fenofibrate 160 MG tablet Take 160 mg by mouth daily.    . fish oil-omega-3 fatty acids 1000 MG capsule Take 1 g by mouth 3 (three) times daily.     Marland Kitchen linagliptin (TRADJENTA) 5 MG TABS tablet Take 5 mg by mouth daily.    Marland Kitchen  pravastatin (PRAVACHOL) 10 MG tablet Take 5 mg by mouth daily.     . sevelamer carbonate (RENVELA) 800 MG tablet Take 800 mg by mouth as directed. Take 2 tablets after each meal and 1 after each snack  (8 tablets daily)    . Tetrahydrozoline HCl (VISINE OP) Place 2 drops into both eyes daily as needed (dry eyes).    . warfarin (COUMADIN) 2.5 MG tablet Take 1 1/2 tablets daily (Patient taking differently: Takes 1 1/2 tabs Tues, Thur, Sun. Takes 1 tab all other days) 135 tablet 3   No current facility-administered medications for this visit.      Allergies as of 12/17/2015  . (No Known Allergies)    Family History  Problem Relation Age of Onset  . Hypertension Mother   . Hypertension      Siblings  . Diabetes type II      Siblings  . Colon cancer Neg Hx     Social History   Social History  . Marital status: Single    Spouse name: N/A  . Number of children: N/A  . Years of education: N/A   Occupational History  . Not on file.   Social History Main Topics  . Smoking status: Former Smoker    Types: Cigarettes    Quit date: 07/04/1998  . Smokeless tobacco: Never Used     Comment: Quit May 2000  . Alcohol use No     Comment: Quit 2014; Previously drank about 3 drinks a day  . Drug use: No  . Sexual activity: Not on file   Other Topics Concern  . Not on file   Social History Narrative  . No narrative on file    Review of Systems: General: Negative for anorexia, weight loss, fever, chills, fatigue, weakness. ENT: Negative for hoarseness, difficulty swallowing. CV: Negative for chest pain, angina, palpitations, peripheral edema.  Respiratory: Negative for dyspnea at rest, dyspnea on exertion, cough, sputum, wheezing.  GI: See history of present illness. MS: Negative for joint pain, low back pain.  Derm: Negative for rash or itching.  Endo: Negative for unusual weight change.  Heme: Negative for bruising or bleeding. Allergy: Negative for rash or hives.    Physical Exam: BP 126/81   Pulse 85   Temp 98 F (36.7 C) (Oral)   Ht 6\' 2"  (1.88 m)   Wt 219 lb 6.4 oz (99.5 kg)   BMI 28.17 kg/m  General:   Alert and oriented. Pleasant and cooperative. Well-nourished and well-developed.  Head:  Normocephalic and atraumatic. Eyes:  Without icterus, sclera clear and conjunctiva pink.  Ears:  Normal auditory acuity. Cardiovascular:  S1, S2 present without murmurs appreciated. Extremities without clubbing or edema. Respiratory:  Clear to auscultation bilaterally. No wheezes, rales, or rhonchi. No distress.   Gastrointestinal:  +BS, soft, non-tender and non-distended. No HSM noted. No guarding or rebound. No masses appreciated.  Rectal:  Deferred  Musculoskalatal:  Symmetrical without gross deformities. Neurologic:  Alert and oriented x4;  grossly normal neurologically. Psych:  Alert and cooperative. Normal mood and affect. Heme/Lymph/Immune: No excessive bruising noted.    12/17/2015 8:33 AM   Disclaimer: This note was dictated with voice recognition software. Similar sounding words can inadvertently be transcribed and may not be corrected upon review.

## 2015-12-18 DIAGNOSIS — N186 End stage renal disease: Secondary | ICD-10-CM | POA: Diagnosis not present

## 2015-12-18 DIAGNOSIS — Z992 Dependence on renal dialysis: Secondary | ICD-10-CM | POA: Diagnosis not present

## 2015-12-18 NOTE — Telephone Encounter (Signed)
Tried to call with no answer  

## 2015-12-20 DIAGNOSIS — Z992 Dependence on renal dialysis: Secondary | ICD-10-CM | POA: Diagnosis not present

## 2015-12-20 DIAGNOSIS — N186 End stage renal disease: Secondary | ICD-10-CM | POA: Diagnosis not present

## 2015-12-22 ENCOUNTER — Other Ambulatory Visit: Payer: Self-pay

## 2015-12-22 DIAGNOSIS — K625 Hemorrhage of anus and rectum: Secondary | ICD-10-CM

## 2015-12-22 MED ORDER — PEG 3350-KCL-NA BICARB-NACL 420 G PO SOLR: 4000.0000 mL | ORAL | 0 refills | Status: DC

## 2015-12-22 NOTE — Progress Notes (Signed)
CC'D TO PCP °

## 2015-12-23 DIAGNOSIS — N186 End stage renal disease: Secondary | ICD-10-CM | POA: Diagnosis not present

## 2015-12-23 DIAGNOSIS — Z992 Dependence on renal dialysis: Secondary | ICD-10-CM | POA: Diagnosis not present

## 2015-12-23 NOTE — Telephone Encounter (Signed)
Pt is set up for 01/07/16 he is aware and instructions are in the mail

## 2015-12-25 DIAGNOSIS — Z992 Dependence on renal dialysis: Secondary | ICD-10-CM | POA: Diagnosis not present

## 2015-12-25 DIAGNOSIS — Z23 Encounter for immunization: Secondary | ICD-10-CM | POA: Diagnosis not present

## 2015-12-25 DIAGNOSIS — N186 End stage renal disease: Secondary | ICD-10-CM | POA: Diagnosis not present

## 2015-12-27 DIAGNOSIS — Z23 Encounter for immunization: Secondary | ICD-10-CM | POA: Diagnosis not present

## 2015-12-27 DIAGNOSIS — Z992 Dependence on renal dialysis: Secondary | ICD-10-CM | POA: Diagnosis not present

## 2015-12-27 DIAGNOSIS — N186 End stage renal disease: Secondary | ICD-10-CM | POA: Diagnosis not present

## 2015-12-29 ENCOUNTER — Telehealth: Payer: Self-pay | Admitting: *Deleted

## 2015-12-29 NOTE — Telephone Encounter (Signed)
Patient states that he is coming off of Coumadin on 01/03/16 for colonoscopy. Wants to know if he needs to keep appointment on 11/8 for Coumadin check. / tg

## 2015-12-29 NOTE — Telephone Encounter (Signed)
INR appt changed from 11/8 to 11/22 after colonoscopy on 11/15.

## 2015-12-30 DIAGNOSIS — Z992 Dependence on renal dialysis: Secondary | ICD-10-CM | POA: Diagnosis not present

## 2015-12-30 DIAGNOSIS — Z23 Encounter for immunization: Secondary | ICD-10-CM | POA: Diagnosis not present

## 2015-12-30 DIAGNOSIS — N186 End stage renal disease: Secondary | ICD-10-CM | POA: Diagnosis not present

## 2016-01-01 DIAGNOSIS — Z992 Dependence on renal dialysis: Secondary | ICD-10-CM | POA: Diagnosis not present

## 2016-01-01 DIAGNOSIS — N186 End stage renal disease: Secondary | ICD-10-CM | POA: Diagnosis not present

## 2016-01-01 DIAGNOSIS — Z23 Encounter for immunization: Secondary | ICD-10-CM | POA: Diagnosis not present

## 2016-01-03 DIAGNOSIS — N186 End stage renal disease: Secondary | ICD-10-CM | POA: Diagnosis not present

## 2016-01-03 DIAGNOSIS — Z992 Dependence on renal dialysis: Secondary | ICD-10-CM | POA: Diagnosis not present

## 2016-01-03 DIAGNOSIS — Z23 Encounter for immunization: Secondary | ICD-10-CM | POA: Diagnosis not present

## 2016-01-06 ENCOUNTER — Telehealth: Payer: Self-pay

## 2016-01-06 DIAGNOSIS — Z992 Dependence on renal dialysis: Secondary | ICD-10-CM | POA: Diagnosis not present

## 2016-01-06 DIAGNOSIS — N186 End stage renal disease: Secondary | ICD-10-CM | POA: Diagnosis not present

## 2016-01-06 DIAGNOSIS — Z23 Encounter for immunization: Secondary | ICD-10-CM | POA: Diagnosis not present

## 2016-01-06 NOTE — Telephone Encounter (Signed)
Called pt to change time for TCS tomorrow d/t a cancellation. Pt's procedure will be at 10:30am. To drink prep in the morning at 5:30am. NPO after 7:30am. Pt ok with time change.

## 2016-01-07 ENCOUNTER — Encounter (HOSPITAL_COMMUNITY)
Admission: RE | Disposition: A | Discharge status: Home / Self Care | Payer: Self-pay | Source: Ambulatory Visit | Attending: Internal Medicine

## 2016-01-07 ENCOUNTER — Ambulatory Visit (HOSPITAL_COMMUNITY)
Admission: RE | Admit: 2016-01-07 | Discharge: 2016-01-07 | Disposition: A | Discharge status: Home / Self Care | Payer: Commercial Managed Care - HMO | Source: Ambulatory Visit | Attending: Internal Medicine | Admitting: Internal Medicine

## 2016-01-07 ENCOUNTER — Encounter (HOSPITAL_COMMUNITY): Payer: Self-pay | Admitting: *Deleted

## 2016-01-07 DIAGNOSIS — Z8249 Family history of ischemic heart disease and other diseases of the circulatory system: Secondary | ICD-10-CM | POA: Insufficient documentation

## 2016-01-07 DIAGNOSIS — Z7901 Long term (current) use of anticoagulants: Secondary | ICD-10-CM | POA: Diagnosis not present

## 2016-01-07 DIAGNOSIS — K640 First degree hemorrhoids: Secondary | ICD-10-CM | POA: Diagnosis not present

## 2016-01-07 DIAGNOSIS — Z833 Family history of diabetes mellitus: Secondary | ICD-10-CM | POA: Insufficient documentation

## 2016-01-07 DIAGNOSIS — Z9889 Other specified postprocedural states: Secondary | ICD-10-CM | POA: Insufficient documentation

## 2016-01-07 DIAGNOSIS — N186 End stage renal disease: Secondary | ICD-10-CM | POA: Insufficient documentation

## 2016-01-07 DIAGNOSIS — Z800 Family history of malignant neoplasm of digestive organs: Secondary | ICD-10-CM | POA: Insufficient documentation

## 2016-01-07 DIAGNOSIS — I120 Hypertensive chronic kidney disease with stage 5 chronic kidney disease or end stage renal disease: Secondary | ICD-10-CM | POA: Diagnosis not present

## 2016-01-07 DIAGNOSIS — Z86010 Personal history of colonic polyps: Secondary | ICD-10-CM | POA: Diagnosis not present

## 2016-01-07 DIAGNOSIS — K5900 Constipation, unspecified: Secondary | ICD-10-CM | POA: Diagnosis not present

## 2016-01-07 DIAGNOSIS — Z992 Dependence on renal dialysis: Secondary | ICD-10-CM | POA: Insufficient documentation

## 2016-01-07 DIAGNOSIS — Z87891 Personal history of nicotine dependence: Secondary | ICD-10-CM | POA: Diagnosis not present

## 2016-01-07 DIAGNOSIS — E1122 Type 2 diabetes mellitus with diabetic chronic kidney disease: Secondary | ICD-10-CM | POA: Diagnosis not present

## 2016-01-07 DIAGNOSIS — I4891 Unspecified atrial fibrillation: Secondary | ICD-10-CM | POA: Insufficient documentation

## 2016-01-07 DIAGNOSIS — K921 Melena: Secondary | ICD-10-CM | POA: Diagnosis not present

## 2016-01-07 DIAGNOSIS — M109 Gout, unspecified: Secondary | ICD-10-CM | POA: Diagnosis not present

## 2016-01-07 DIAGNOSIS — G4733 Obstructive sleep apnea (adult) (pediatric): Secondary | ICD-10-CM | POA: Insufficient documentation

## 2016-01-07 DIAGNOSIS — K625 Hemorrhage of anus and rectum: Secondary | ICD-10-CM

## 2016-01-07 DIAGNOSIS — Z85528 Personal history of other malignant neoplasm of kidney: Secondary | ICD-10-CM | POA: Diagnosis not present

## 2016-01-07 DIAGNOSIS — E782 Mixed hyperlipidemia: Secondary | ICD-10-CM | POA: Diagnosis not present

## 2016-01-07 DIAGNOSIS — I429 Cardiomyopathy, unspecified: Secondary | ICD-10-CM | POA: Diagnosis not present

## 2016-01-07 HISTORY — PX: COLONOSCOPY: SHX5424

## 2016-01-07 LAB — GLUCOSE, CAPILLARY: Glucose-Capillary: 93 mg/dL (ref 65–99)

## 2016-01-07 SURGERY — COLONOSCOPY
Anesthesia: Moderate Sedation

## 2016-01-07 MED ORDER — MIDAZOLAM HCL 5 MG/5ML IJ SOLN
INTRAMUSCULAR | Status: DC | PRN
  Administered 2016-01-07: 2 mg via INTRAVENOUS

## 2016-01-07 MED ORDER — ONDANSETRON HCL 4 MG/2ML IJ SOLN
INTRAMUSCULAR | Status: DC | PRN
  Administered 2016-01-07: 4 mg via INTRAVENOUS

## 2016-01-07 MED ORDER — MIDAZOLAM HCL 5 MG/5ML IJ SOLN
INTRAMUSCULAR | Status: AC
  Filled 2016-01-07: qty 10

## 2016-01-07 MED ORDER — MEPERIDINE HCL 100 MG/ML IJ SOLN
INTRAMUSCULAR | Status: AC
  Filled 2016-01-07: qty 2

## 2016-01-07 MED ORDER — ONDANSETRON HCL 4 MG/2ML IJ SOLN
INTRAMUSCULAR | Status: AC
  Filled 2016-01-07: qty 2

## 2016-01-07 MED ORDER — SODIUM CHLORIDE 0.9 % IV SOLN
INTRAVENOUS | Status: DC
  Administered 2016-01-07: 1000 mL via INTRAVENOUS

## 2016-01-07 MED ORDER — MEPERIDINE HCL 100 MG/ML IJ SOLN
INTRAMUSCULAR | Status: DC | PRN
  Administered 2016-01-07: 50 mg via INTRAVENOUS

## 2016-01-07 NOTE — H&P (View-Only) (Signed)
Primary Care Physician:  Glo Herring., MD Primary Gastroenterologist:  Dr. [name]  Chief Complaint  Patient presents with  . Colonoscopy    hx of polyps, last TCS 5 yrs ago, blood when wipes x 14months, constipation at times    HPI:   Albert [name] is a 60 y.o. male who presents to schedule colonoscopy and was brought into the office for scheduling due to use of Coumadin. Last colonoscopy 05/04/2010 for personal history of colon polyps which found normal rectum, few scattered pancolonic diverticula, single diminutive polyp in the distal transverse colon. Surgical pathology found the polyp to be hyperplastic. recommended repeat colonoscopy in 5 years, currently due.  PCP notes reviewed. Last hemoglobin and records dated 08/12/2014 which found anemia with a hemoglobin of 11.3.  Today he states he's doing well overall. Dr. Domenic Polite manages his coumadin. Denies abdominal pain, N/V. Has had toilet tissue hematochezia with nearly every bowel movement, occasionally heavier bleeding. Has intermittent constipation but doesn't use any medications for it. Eats "quite a lot" of fiber, is on a fluid restriction due to Dialysis. Denies melena, unintentional weight loss, fever, chills, acute changes in bowel habits. Has an "upset stomach" now and then, which typically happens after Renvela and thinks that's a side effect from that medication. Denies chest pain, dyspnea, dizziness, lightheadedness, syncope, near syncope. Denies any other upper or lower GI symptoms.  Past Medical History:  Diagnosis Date  . Anemia   . Atrial fibrillation ([place])   . Cardiomyopathy    LVEF 50-55% 8/11 - [facility]  . Colonic polyp   . Diverticulosis of colon   . Dysrhythmia    afib  . ESRD on hemodialysis ([place])    Dr. Lowanda Foster [facility]  . Essential hypertension, benign   . Gout   . Mixed hyperlipidemia   . OSA (obstructive sleep apnea)    CPAP  . Renal cell cancer ([place])   . Type 2 diabetes mellitus ([place])    Type II    Past Surgical History:  Procedure Laterality Date  . COLONOSCOPY W/ POLYPECTOMY    . FISTULA SUPERFICIALIZATION Left 03/31/2015   Procedure: CEPHALIC VEIN TURNDOWN; PLICATION OF BRACHIOCEPHALIC AVF;  Surgeon: Conrad Sumner, MD;  Location: [facility];  Service: Vascular;  Laterality: Left;  . FISTULOGRAM N/A 10/19/2013   Procedure: FISTULOGRAM;  Surgeon: Rosetta Posner, MD;  Location: [facility] CATH LAB;  Service: Cardiovascular;  Laterality: N/A;  . Left arm AV fistula  2009   Dr. [name]  . PERIPHERAL VASCULAR CATHETERIZATION N/A 03/10/2015   Procedure: Fistulagram;  Surgeon: Conrad Vinton, MD;  Location: [facility];  Service: Cardiovascular;  Laterality: N/A;  . Right nephrectomy      Current Outpatient Prescriptions  Medication Sig Dispense Refill  . allopurinol (ZYLOPRIM) 100 MG tablet Take 100 mg by mouth daily.     . carvedilol (COREG) 25 MG tablet TAKE 1 TABLET TWICE DAILY WITH MEALS (Patient taking differently: TAKE 0.5 TABLET TWICE DAILY WITH MEALS) 180 tablet 3  . cinacalcet (SENSIPAR) 30 MG tablet Take 30 mg by mouth daily.    Marland Kitchen diltiazem (TIAZAC) 300 MG 24 hr capsule TAKE 1 CAPSULE EVERY DAY 90 capsule 2  . fenofibrate 160 MG tablet Take 160 mg by mouth daily.    . fish oil-omega-3 fatty acids 1000 MG capsule Take 1 g by mouth 3 (three) times daily.     Marland Kitchen linagliptin (TRADJENTA) 5 MG TABS tablet Take 5 mg by mouth daily.    Marland Kitchen  pravastatin (PRAVACHOL) 10 MG tablet Take 5 mg by mouth daily.     . sevelamer carbonate (RENVELA) 800 MG tablet Take 800 mg by mouth as directed. Take 2 tablets after each meal and 1 after each snack  (8 tablets daily)    . Tetrahydrozoline HCl (VISINE OP) Place 2 drops into both eyes daily as needed (dry eyes).    . warfarin (COUMADIN) 2.5 MG tablet Take 1 1/2 tablets daily (Patient taking differently: Takes 1 1/2 tabs Tues, Thur, Sun. Takes 1 tab all other days) 135 tablet 3   No current facility-administered medications for this visit.      Allergies as of 12/17/2015  . (No Known Allergies)    Family History  Problem Relation Age of Onset  . Hypertension Mother   . Hypertension      Siblings  . Diabetes type II      Siblings  . Colon cancer Neg Hx     Social History   Social History  . Marital status: Single    Spouse name: N/A  . Number of children: N/A  . Years of education: N/A   Occupational History  . Not on file.   Social History Main Topics  . Smoking status: Former Smoker    Types: Cigarettes    Quit date: 07/04/1998  . Smokeless tobacco: Never Used     Comment: Quit May 2000  . Alcohol use No     Comment: Quit 2014; Previously drank about 3 drinks a day  . Drug use: No  . Sexual activity: Not on file   Other Topics Concern  . Not on file   Social History Narrative  . No narrative on file    Review of Systems: General: Negative for anorexia, weight loss, fever, chills, fatigue, weakness. ENT: Negative for hoarseness, difficulty swallowing. CV: Negative for chest pain, angina, palpitations, peripheral edema.  Respiratory: Negative for dyspnea at rest, dyspnea on exertion, cough, sputum, wheezing.  GI: See history of present illness. MS: Negative for joint pain, low back pain.  Derm: Negative for rash or itching.  Endo: Negative for unusual weight change.  Heme: Negative for bruising or bleeding. Allergy: Negative for rash or hives.    Physical Exam: BP 126/81   Pulse 85   Temp 98 F (36.7 C) (Oral)   Ht 6\' 2"  (1.88 m)   Wt 219 lb 6.4 oz (99.5 kg)   BMI 28.17 kg/m  General:   Alert and oriented. Pleasant and cooperative. Well-nourished and well-developed.  Head:  Normocephalic and atraumatic. Eyes:  Without icterus, sclera clear and conjunctiva pink.  Ears:  Normal auditory acuity. Cardiovascular:  S1, S2 present without murmurs appreciated. Extremities without clubbing or edema. Respiratory:  Clear to auscultation bilaterally. No wheezes, rales, or rhonchi. No distress.   Gastrointestinal:  +BS, soft, non-tender and non-distended. No HSM noted. No guarding or rebound. No masses appreciated.  Rectal:  Deferred  Musculoskalatal:  Symmetrical without gross deformities. Neurologic:  Alert and oriented x4;  grossly normal neurologically. Psych:  Alert and cooperative. Normal mood and affect. Heme/Lymph/Immune: No excessive bruising noted.    12/17/2015 8:33 AM   Disclaimer: This note was dictated with voice recognition software. Similar sounding words can inadvertently be transcribed and may not be corrected upon review.

## 2016-01-07 NOTE — Op Note (Signed)
[facility] Patient Name: Albert [name] Procedure Date: 01/07/2016 10:23 AM MRN: [id number] Date of Birth: Oct 23, 1955 Attending MD: Norvel Richards , MD CSN: [id number] Age: 60 Admit Type: Outpatient Procedure:                Colonoscopy - diagnostic Indications:              Hematochezia Providers:                Norvel Richards, MD, Janeece Riggers, RN, Lurline Del, RN, Purcell Nails. [place], [employer] officer Referring MD:              Medicines:                Midazolam 2 mg IV, Meperidine 50 mg IV Complications:            No immediate complications. Estimated Blood Loss:     Estimated blood loss: none. Procedure:                Pre-Anesthesia Assessment:                           - Prior to the procedure, a History and Physical                            was performed, and patient medications and                            allergies were reviewed. The patient's tolerance of                            previous anesthesia was also reviewed. The risks                            and benefits of the procedure and the sedation                            options and risks were discussed with the patient.                            All questions were answered, and informed consent                            was obtained. Prior Anticoagulants: The patient has                            taken no previous anticoagulant or antiplatelet                            agents. ASA Grade Assessment: III - A patient with                            severe systemic disease. After reviewing the risks  and benefits, the patient was deemed in                            satisfactory condition to undergo the procedure.                           After obtaining informed consent, the colonoscope                            was passed under direct vision. Throughout the                            procedure, the patient's blood pressure, pulse, and                      oxygen saturations were monitored continuously. The                            EC-[id number] ([id number]) scope was introduced through                            the anus and advanced to the the cecum, identified                            by appendiceal orifice and ileocecal valve. The                            colonoscopy was performed without difficulty. The                            patient tolerated the procedure well. The quality                            of the bowel preparation was adequate. The                            ileocecal valve, appendiceal orifice, and rectum                            were photographed. The entire colon was well                            visualized. Scope In: 10:26:10 AM Scope Out: 10:40:44 AM Scope Withdrawal Time: 0 hours 9 minutes 33 seconds  Total Procedure Duration: 0 hours 14 minutes 34 seconds  Findings:      The perianal and digital rectal examinations were normal.      Internal hemorrhoids were found during retroflexion. The hemorrhoids       were Grade I (internal hemorrhoids that do not prolapse).      The exam was otherwise without abnormality on direct and retroflexion       views. Impression:               - Internal hemorrhoids. likely source of  hematochezia                           - The examination was otherwise normal on direct                            and retroflexion views.                           - No specimens collected. Moderate Sedation:      Moderate (conscious) sedation was administered by the endoscopy nurse       and supervised by the endoscopist. The following parameters were       monitored: oxygen saturation, heart rate, blood pressure, respiratory       rate, EKG, adequacy of pulmonary ventilation, and response to care.       Total physician intraservice time was 18 minutes. Recommendation:           - Patient has a contact number available for                             emergencies. The signs and symptoms of potential                            delayed complications were discussed with the                            patient. Return to normal activities tomorrow.                            Written discharge instructions were provided to the                            patient.                           - Resume previous diet.                           - Continue present medications. Resume Coumadin                            today. Add Benefiber 1 tablespoon twice daily to                            regimen. Ten-day course of Anusol HC                            suppositories?"1per rectum at bedtime.                           - Repeat colonoscopy in 5 years for surveillance.                           - Return to GI clinic in 8 weeks. Procedure Code(s):        --- Professional ---  [phone], Colonoscopy, flexible; diagnostic, including                            collection of specimen(s) by brushing or washing,                            when performed (separate procedure)                           99152, Moderate sedation services provided by the                            same physician or other qualified health care                            professional performing the diagnostic or                            therapeutic service that the sedation supports,                            requiring the presence of an independent trained                            observer to assist in the monitoring of the                            patient's level of consciousness and physiological                            status; initial 15 minutes of intraservice time,                            patient age 31 years or older Diagnosis Code(s):        --- Professional ---                           K64.0, First degree hemorrhoids                           K92.1, Melena (includes Hematochezia) CPT copyright 2016 American Medical Association. All  rights reserved. The codes documented in this report are preliminary and upon coder review may  be revised to meet current compliance requirements. Cristopher Estimable. Daleisa Halperin, MD Norvel Richards, MD 01/07/2016 10:53:01 AM This report has been signed electronically. Number of Addenda: 0

## 2016-01-07 NOTE — Interval H&P Note (Signed)
History and Physical Interval Note:  01/07/2016 10:20 AM  Albert [name]  has presented today for surgery, with the diagnosis of BRBPR  The various methods of treatment have been discussed with the patient and family. After consideration of risks, benefits and other options for treatment, the patient has consented to  Procedure(s) with comments: COLONOSCOPY (N/A) - 100 as a surgical intervention .  The patient's history has been reviewed, patient examined, no change in status, stable for surgery.  I have reviewed the patient's chart and labs.  Questions were answered to the patient's satisfaction.    No change. Coumadin held 4 days per plan. Diagnostic colonoscopy per plan. The risks, benefits, limitations, alternatives and imponderables have been reviewed with the patient. Questions have been answered. All parties are agreeable.  Manus Rudd

## 2016-01-07 NOTE — Discharge Instructions (Addendum)
Colonoscopy Discharge Instructions  Read the instructions outlined below and refer to this sheet in the next few weeks. These discharge instructions provide you with general information on caring for yourself after you leave the hospital. Your doctor may also give you specific instructions. While your treatment has been planned according to the most current medical practices available, unavoidable complications occasionally occur. If you have any problems or questions after discharge, call Dr. [name] at [phone]. ACTIVITY  You may resume your regular activity, but move at a slower pace for the next 24 hours.   Take frequent rest periods for the next 24 hours.   Walking will help get rid of the air and reduce the bloated feeling in your belly (abdomen).   No driving for 24 hours (because of the medicine (anesthesia) used during the test).    Do not sign any important legal documents or operate any machinery for 24 hours (because of the anesthesia used during the test).  NUTRITION  Drink plenty of fluids.   You may resume your normal diet as instructed by your doctor.   Begin with a light meal and progress to your normal diet. Heavy or fried foods are harder to digest and may make you feel sick to your stomach (nauseated).   Avoid alcoholic beverages for 24 hours or as instructed.  MEDICATIONS  You may resume your normal medications unless your doctor tells you otherwise.  WHAT YOU CAN EXPECT TODAY  Some feelings of bloating in the abdomen.   Passage of more gas than usual.   Spotting of blood in your stool or on the toilet paper.  IF YOU HAD POLYPS REMOVED DURING THE COLONOSCOPY:  No aspirin products for 7 days or as instructed.   No alcohol for 7 days or as instructed.   Eat a soft diet for the next 24 hours.  FINDING OUT THE RESULTS OF YOUR TEST Not all test results are available during your visit. If your test results are not back during the visit, make an appointment  with your caregiver to find out the results. Do not assume everything is normal if you have not heard from your caregiver or the medical facility. It is important for you to follow up on all of your test results.  SEEK IMMEDIATE MEDICAL ATTENTION IF:  You have more than a spotting of blood in your stool.   Your belly is swollen (abdominal distention).   You are nauseated or vomiting.   You have a temperature over 101.   You have abdominal pain or discomfort that is severe or gets worse throughout the day.    Hemorrhoid information provided  Ten-day course of Anusol 1 per rectum at bedtime 10 days.  Begin Benefiber 1 tablespoon twice daily  Resume Coumadin today   office visit with [place] in 8 weeks     * January 10th, 2018, with Walden Field, NP  8:30AM.    Hemorrhoids Hemorrhoids are swollen veins in and around the rectum or anus. There are two types of hemorrhoids:  Internal hemorrhoids. These occur in the veins that are just inside the rectum. They may poke through to the outside and become irritated and painful.  External hemorrhoids. These occur in the veins that are outside of the anus and can be felt as a painful swelling or hard lump near the anus. Most hemorrhoids do not cause serious problems, and they can be managed with home treatments such as diet and lifestyle changes. If home treatments do not  help your symptoms, procedures can be done to shrink or remove the hemorrhoids. What are the causes? This condition is caused by increased pressure in the anal area. This pressure may result from various things, including:  Constipation.  Straining to have a bowel movement.  Diarrhea.  Pregnancy.  Obesity.  Sitting for long periods of time.  Heavy lifting or other activity that causes you to strain.  Anal sex. What are the signs or symptoms? Symptoms of this condition include:  Pain.  Anal itching or irritation.  Rectal bleeding.  Leakage of stool  (feces).  Anal swelling.  One or more lumps around the anus. How is this diagnosed? This condition can often be diagnosed through a visual exam. Other exams or tests may also be done, such as:  Examination of the rectal area with a gloved hand (digital rectal exam).  Examination of the anal canal using a small tube (anoscope).  A blood test, if you have lost a significant amount of blood.  A test to look inside the colon (sigmoidoscopy or colonoscopy). How is this treated? This condition can usually be treated at home. However, various procedures may be done if dietary changes, lifestyle changes, and other home treatments do not help your symptoms. These procedures can help make the hemorrhoids smaller or remove them completely. Some of these procedures involve surgery, and others do not. Common procedures include:  Rubber band ligation. Rubber bands are placed at the base of the hemorrhoids to cut off the blood supply to them.  Sclerotherapy. Medicine is injected into the hemorrhoids to shrink them.  Infrared coagulation. A type of light energy is used to get rid of the hemorrhoids.  Hemorrhoidectomy surgery. The hemorrhoids are surgically removed, and the veins that supply them are tied off.  Stapled hemorrhoidopexy surgery. A circular stapling device is used to remove the hemorrhoids and use staples to cut off the blood supply to them. Follow these instructions at home: Eating and drinking  Eat foods that have a lot of fiber in them, such as whole grains, beans, nuts, fruits, and vegetables. Ask your health care provider about taking products that have added fiber (fiber supplements).  Drink enough fluid to keep your urine clear or pale yellow. Managing pain and swelling  Take warm sitz baths for 20 minutes, 3-4 times a day to ease pain and discomfort.  If directed, apply ice to the affected area. Using ice packs between sitz baths may be helpful.  Put ice in a plastic  bag.  Place a towel between your skin and the bag.  Leave the ice on for 20 minutes, 2-3 times a day. General instructions  Take over-the-counter and prescription medicines only as told by your health care provider.  Use medicated creams or suppositories as told.  Exercise regularly.  Go to the bathroom when you have the urge to have a bowel movement. Do not wait.  Avoid straining to have bowel movements.  Keep the anal area dry and clean. Use wet toilet paper or moist towelettes after a bowel movement.  Do not sit on the toilet for long periods of time. This increases blood pooling and pain. Contact a health care provider if:  You have increasing pain and swelling that are not controlled by treatment or medicine.  You have uncontrolled bleeding.  You have difficulty having a bowel movement, or you are unable to have a bowel movement.  You have pain or inflammation outside the area of the hemorrhoids. This information is  not intended to replace advice given to you by your health care provider. Make sure you discuss any questions you have with your health care provider. Document Released: 02/06/2000 Document Revised: 07/09/2015 Document Reviewed: 10/23/2014 Elsevier Interactive Patient Education  2017 [employer].

## 2016-01-08 DIAGNOSIS — N186 End stage renal disease: Secondary | ICD-10-CM | POA: Diagnosis not present

## 2016-01-08 DIAGNOSIS — Z23 Encounter for immunization: Secondary | ICD-10-CM | POA: Diagnosis not present

## 2016-01-08 DIAGNOSIS — Z992 Dependence on renal dialysis: Secondary | ICD-10-CM | POA: Diagnosis not present

## 2016-01-09 ENCOUNTER — Encounter (HOSPITAL_COMMUNITY): Payer: Self-pay | Admitting: Internal Medicine

## 2016-01-10 DIAGNOSIS — Z23 Encounter for immunization: Secondary | ICD-10-CM | POA: Diagnosis not present

## 2016-01-10 DIAGNOSIS — Z992 Dependence on renal dialysis: Secondary | ICD-10-CM | POA: Diagnosis not present

## 2016-01-10 DIAGNOSIS — N186 End stage renal disease: Secondary | ICD-10-CM | POA: Diagnosis not present

## 2016-01-13 DIAGNOSIS — Z23 Encounter for immunization: Secondary | ICD-10-CM | POA: Diagnosis not present

## 2016-01-13 DIAGNOSIS — Z992 Dependence on renal dialysis: Secondary | ICD-10-CM | POA: Diagnosis not present

## 2016-01-13 DIAGNOSIS — N186 End stage renal disease: Secondary | ICD-10-CM | POA: Diagnosis not present

## 2016-01-14 ENCOUNTER — Ambulatory Visit (INDEPENDENT_AMBULATORY_CARE_PROVIDER_SITE_OTHER): Payer: Commercial Managed Care - HMO | Admitting: *Deleted

## 2016-01-14 DIAGNOSIS — Z5181 Encounter for therapeutic drug level monitoring: Secondary | ICD-10-CM

## 2016-01-14 DIAGNOSIS — I4891 Unspecified atrial fibrillation: Secondary | ICD-10-CM | POA: Diagnosis not present

## 2016-01-14 LAB — POCT INR: INR: 1.7

## 2016-01-15 DIAGNOSIS — Z23 Encounter for immunization: Secondary | ICD-10-CM | POA: Diagnosis not present

## 2016-01-15 DIAGNOSIS — Z992 Dependence on renal dialysis: Secondary | ICD-10-CM | POA: Diagnosis not present

## 2016-01-15 DIAGNOSIS — N186 End stage renal disease: Secondary | ICD-10-CM | POA: Diagnosis not present

## 2016-01-17 DIAGNOSIS — Z23 Encounter for immunization: Secondary | ICD-10-CM | POA: Diagnosis not present

## 2016-01-17 DIAGNOSIS — N186 End stage renal disease: Secondary | ICD-10-CM | POA: Diagnosis not present

## 2016-01-17 DIAGNOSIS — Z992 Dependence on renal dialysis: Secondary | ICD-10-CM | POA: Diagnosis not present

## 2016-01-20 DIAGNOSIS — Z992 Dependence on renal dialysis: Secondary | ICD-10-CM | POA: Diagnosis not present

## 2016-01-20 DIAGNOSIS — N186 End stage renal disease: Secondary | ICD-10-CM | POA: Diagnosis not present

## 2016-01-20 DIAGNOSIS — Z23 Encounter for immunization: Secondary | ICD-10-CM | POA: Diagnosis not present

## 2016-01-21 ENCOUNTER — Telehealth: Payer: Self-pay

## 2016-01-21 MED ORDER — HYDROCORTISONE 2.5 % RE CREA: 1.0000 "application " | TOPICAL_CREAM | Freq: Two times a day (BID) | RECTAL | 0 refills | Status: DC

## 2016-01-21 NOTE — Telephone Encounter (Signed)
I sent in proctosol.

## 2016-01-21 NOTE — Telephone Encounter (Signed)
Received a fax from [facility] requesting an alternative to the prescription for Anusol.  Alternative: Proctosol HC  2.5% CRE W?appl and pt will have no copay.  Please advise!

## 2016-01-22 DIAGNOSIS — Z992 Dependence on renal dialysis: Secondary | ICD-10-CM | POA: Diagnosis not present

## 2016-01-22 DIAGNOSIS — N186 End stage renal disease: Secondary | ICD-10-CM | POA: Diagnosis not present

## 2016-01-22 DIAGNOSIS — Z23 Encounter for immunization: Secondary | ICD-10-CM | POA: Diagnosis not present

## 2016-01-22 NOTE — Telephone Encounter (Signed)
LMOM that A new RX has been sent in and to let me know if he has problems.

## 2016-01-24 DIAGNOSIS — Z992 Dependence on renal dialysis: Secondary | ICD-10-CM | POA: Diagnosis not present

## 2016-01-24 DIAGNOSIS — N186 End stage renal disease: Secondary | ICD-10-CM | POA: Diagnosis not present

## 2016-01-27 DIAGNOSIS — N186 End stage renal disease: Secondary | ICD-10-CM | POA: Diagnosis not present

## 2016-01-27 DIAGNOSIS — Z992 Dependence on renal dialysis: Secondary | ICD-10-CM | POA: Diagnosis not present

## 2016-01-29 DIAGNOSIS — Z992 Dependence on renal dialysis: Secondary | ICD-10-CM | POA: Diagnosis not present

## 2016-01-29 DIAGNOSIS — N186 End stage renal disease: Secondary | ICD-10-CM | POA: Diagnosis not present

## 2016-01-31 DIAGNOSIS — Z992 Dependence on renal dialysis: Secondary | ICD-10-CM | POA: Diagnosis not present

## 2016-01-31 DIAGNOSIS — N186 End stage renal disease: Secondary | ICD-10-CM | POA: Diagnosis not present

## 2016-02-03 DIAGNOSIS — Z992 Dependence on renal dialysis: Secondary | ICD-10-CM | POA: Diagnosis not present

## 2016-02-03 DIAGNOSIS — N186 End stage renal disease: Secondary | ICD-10-CM | POA: Diagnosis not present

## 2016-02-04 ENCOUNTER — Ambulatory Visit (INDEPENDENT_AMBULATORY_CARE_PROVIDER_SITE_OTHER): Payer: Commercial Managed Care - HMO | Admitting: *Deleted

## 2016-02-04 DIAGNOSIS — Z5181 Encounter for therapeutic drug level monitoring: Secondary | ICD-10-CM | POA: Diagnosis not present

## 2016-02-04 DIAGNOSIS — I4891 Unspecified atrial fibrillation: Secondary | ICD-10-CM | POA: Diagnosis not present

## 2016-02-04 LAB — POCT INR: INR: 2.5

## 2016-02-05 DIAGNOSIS — Z992 Dependence on renal dialysis: Secondary | ICD-10-CM | POA: Diagnosis not present

## 2016-02-05 DIAGNOSIS — N186 End stage renal disease: Secondary | ICD-10-CM | POA: Diagnosis not present

## 2016-02-07 DIAGNOSIS — N186 End stage renal disease: Secondary | ICD-10-CM | POA: Diagnosis not present

## 2016-02-07 DIAGNOSIS — Z992 Dependence on renal dialysis: Secondary | ICD-10-CM | POA: Diagnosis not present

## 2016-02-10 DIAGNOSIS — N186 End stage renal disease: Secondary | ICD-10-CM | POA: Diagnosis not present

## 2016-02-10 DIAGNOSIS — Z992 Dependence on renal dialysis: Secondary | ICD-10-CM | POA: Diagnosis not present

## 2016-02-12 DIAGNOSIS — Z992 Dependence on renal dialysis: Secondary | ICD-10-CM | POA: Diagnosis not present

## 2016-02-12 DIAGNOSIS — N186 End stage renal disease: Secondary | ICD-10-CM | POA: Diagnosis not present

## 2016-02-14 DIAGNOSIS — N186 End stage renal disease: Secondary | ICD-10-CM | POA: Diagnosis not present

## 2016-02-14 DIAGNOSIS — Z992 Dependence on renal dialysis: Secondary | ICD-10-CM | POA: Diagnosis not present

## 2016-02-17 DIAGNOSIS — N186 End stage renal disease: Secondary | ICD-10-CM | POA: Diagnosis not present

## 2016-02-17 DIAGNOSIS — Z992 Dependence on renal dialysis: Secondary | ICD-10-CM | POA: Diagnosis not present

## 2016-02-18 ENCOUNTER — Other Ambulatory Visit: Payer: Self-pay | Admitting: Cardiology

## 2016-02-19 DIAGNOSIS — Z992 Dependence on renal dialysis: Secondary | ICD-10-CM | POA: Diagnosis not present

## 2016-02-19 DIAGNOSIS — N186 End stage renal disease: Secondary | ICD-10-CM | POA: Diagnosis not present

## 2016-02-21 DIAGNOSIS — N186 End stage renal disease: Secondary | ICD-10-CM | POA: Diagnosis not present

## 2016-02-21 DIAGNOSIS — Z992 Dependence on renal dialysis: Secondary | ICD-10-CM | POA: Diagnosis not present

## 2016-02-22 DIAGNOSIS — Z992 Dependence on renal dialysis: Secondary | ICD-10-CM | POA: Diagnosis not present

## 2016-02-22 DIAGNOSIS — N186 End stage renal disease: Secondary | ICD-10-CM | POA: Diagnosis not present

## 2016-02-24 DIAGNOSIS — N186 End stage renal disease: Secondary | ICD-10-CM | POA: Diagnosis not present

## 2016-02-24 DIAGNOSIS — Z992 Dependence on renal dialysis: Secondary | ICD-10-CM | POA: Diagnosis not present

## 2016-02-26 DIAGNOSIS — N186 End stage renal disease: Secondary | ICD-10-CM | POA: Diagnosis not present

## 2016-02-26 DIAGNOSIS — Z992 Dependence on renal dialysis: Secondary | ICD-10-CM | POA: Diagnosis not present

## 2016-02-28 DIAGNOSIS — N186 End stage renal disease: Secondary | ICD-10-CM | POA: Diagnosis not present

## 2016-02-28 DIAGNOSIS — Z992 Dependence on renal dialysis: Secondary | ICD-10-CM | POA: Diagnosis not present

## 2016-03-02 DIAGNOSIS — Z992 Dependence on renal dialysis: Secondary | ICD-10-CM | POA: Diagnosis not present

## 2016-03-02 DIAGNOSIS — E119 Type 2 diabetes mellitus without complications: Secondary | ICD-10-CM | POA: Diagnosis not present

## 2016-03-02 DIAGNOSIS — N186 End stage renal disease: Secondary | ICD-10-CM | POA: Diagnosis not present

## 2016-03-03 ENCOUNTER — Ambulatory Visit (INDEPENDENT_AMBULATORY_CARE_PROVIDER_SITE_OTHER): Payer: Commercial Managed Care - HMO | Admitting: *Deleted

## 2016-03-03 ENCOUNTER — Encounter: Payer: Self-pay | Admitting: Nurse Practitioner

## 2016-03-03 ENCOUNTER — Ambulatory Visit (INDEPENDENT_AMBULATORY_CARE_PROVIDER_SITE_OTHER): Payer: Commercial Managed Care - HMO | Admitting: Nurse Practitioner

## 2016-03-03 VITALS — BP 122/79 | HR 95 | Temp 97.1°F | Ht 74.0 in | Wt 218.6 lb

## 2016-03-03 DIAGNOSIS — Z5181 Encounter for therapeutic drug level monitoring: Secondary | ICD-10-CM | POA: Diagnosis not present

## 2016-03-03 DIAGNOSIS — I4891 Unspecified atrial fibrillation: Secondary | ICD-10-CM

## 2016-03-03 DIAGNOSIS — K5900 Constipation, unspecified: Secondary | ICD-10-CM

## 2016-03-03 DIAGNOSIS — K625 Hemorrhage of anus and rectum: Secondary | ICD-10-CM

## 2016-03-03 LAB — POCT INR: INR: 3.1

## 2016-03-03 MED ORDER — HYDROCORTISONE ACETATE 25 MG RE SUPP: 25.0000 mg | Freq: Two times a day (BID) | RECTAL | 1 refills | Status: DC

## 2016-03-03 NOTE — Assessment & Plan Note (Signed)
Rectal bleeding essentially resolved. Insurance would not pay for suppositories and they provided him with cream via mail order. He does not feel this helped very much. Also, it was difficult to use. He is requesting a prescription for suppositories to be sent to [facility]. I will send this and, monitor for further bleeding, return for follow-up as needed.

## 2016-03-03 NOTE — Progress Notes (Signed)
Referring Provider: Redmond School, MD Primary Care Physician:  Glo Herring., MD Primary GI:  Dr. [name]  Chief Complaint  Patient presents with  . Constipation    HPI:   Albert [name] is a 61 y.o. male who presents for 8 week post-procedure follow-up. The patient was last seen in our office on 12/17/15 for constipation, history of colon polyps, and rectal bleeding. At that time it was noted he is on Coumadin and recent hemoglobin with anemia of 11.3. Noted toilet tissue hematochezia with nearly every bowel movement, occasionally heavier. Intermittent constipation. He has end-stage renal disease on hemodialysis. He was arranged for colonoscopy.  Colonoscopy completed on 01/07/2016 which found internal hemorrhoids grade 1, otherwise normal. Recommended continue previous medications, add fiber 1 tablespoon twice daily, ten-day course of Anusol suppositories, repeat colonoscopy in 5 years. Was recommended to follow-up in our office 8 weeks post procedure.  Today he states he's doing well. Is still on Coumadin. Has not noted any further bleeding since the procedure. Is using Benefiber and constipation is significantly improved, stools softer. His insurance wouldn't cover suppositories through mail order and he was given cream, which was difficult to use and not as effective. He is asking for another prescription for suppositories to be sent to [facility]. Denies abdominal pain, N/V, dark stools. Denies chest pain, dyspnea, dizziness, lightheadedness, syncope, near syncope. Denies any other upper or lower GI symptoms.   Past Medical History:  Diagnosis Date  . Anemia   . Atrial fibrillation ([place])   . Cardiomyopathy    LVEF 50-55% 8/11 - [facility]  . Colonic polyp   . Diverticulosis of colon   . Dysrhythmia    afib  . ESRD on hemodialysis ([place])    Dr. [facility]anda Foster [facility]  . Essential hypertension, benign   . Gout   . Mixed hyperlipidemia   . OSA (obstructive sleep apnea)    CPAP  . Renal cell cancer ([place])   . Type 2 diabetes mellitus ([place])    Type II    Past Surgical History:  Procedure Laterality Date  . COLONOSCOPY N/A 01/07/2016   Procedure: COLONOSCOPY;  Surgeon: Daneil Dolin, MD;  [facility];  Service: Endoscopy;  Laterality: N/A;  100  . COLONOSCOPY W/ POLYPECTOMY    . FISTULA SUPERFICIALIZATION Left 03/31/2015   Procedure: CEPHALIC VEIN TURNDOWN; PLICATION OF BRACHIOCEPHALIC AVF;  Surgeon: Conrad Hemlock, MD;  Location: [place];  Service: Vascular;  Laterality: Left;  . FISTULOGRAM N/A 10/19/2013   Procedure: FISTULOGRAM;  Surgeon: Rosetta Posner, MD;  Location: [facility] CATH LAB;  Service: Cardiovascular;  Laterality: N/A;  . Left arm AV fistula  2009   Dr. [name]  . PERIPHERAL VASCULAR CATHETERIZATION N/A 03/10/2015   Procedure: Fistulagram;  Surgeon: Conrad [place], MD;  Location: [facility] CV LAB;  Service: Cardiovascular;  Laterality: N/A;  . Right nephrectomy      Current Outpatient Prescriptions  Medication Sig Dispense Refill  . allopurinol (ZYLOPRIM) 100 MG tablet Take 100 mg by mouth daily.     . carvedilol (COREG) 25 MG tablet TAKE 1 TABLET TWICE DAILY WITH MEALS (Patient taking differently: TAKE 0.5 TABLET TWICE DAILY WITH MEALS) 180 tablet 3  . cinacalcet (SENSIPAR) 30 MG tablet Take 30 mg by mouth at bedtime.     Marland Kitchen diltiazem (TIAZAC) 300 MG 24 hr capsule TAKE 1 CAPSULE EVERY DAY (Patient taking differently: 180 mg) 90 capsule 2  . docusate sodium (COLACE) 100 MG capsule Take 100  mg by mouth daily.    . fenofibrate 160 MG tablet Take 160 mg by mouth daily.    . fish oil-omega-3 fatty acids 1000 MG capsule Take 1 g by mouth 3 (three) times daily.     . hydrocortisone (PROCTOSOL HC) 2.5 % rectal cream Place 1 application rectally 2 (two) times daily. 30 g 0  . linagliptin (TRADJENTA) 5 MG TABS tablet Take 5 mg by mouth daily.    . pravastatin (PRAVACHOL) 10 MG tablet Take 5 mg by mouth every evening.     . sevelamer carbonate  (RENVELA) 800 MG tablet Take 800-1,600 mg by mouth as directed. Take 2 tablets after each meal and 1 after each snack  (8 tablets daily)     . Tetrahydrozoline HCl (VISINE OP) Place 2 drops into both eyes daily as needed (dry eyes).    . warfarin (COUMADIN) 2.5 MG tablet Take 1 tablet daily except 1 1/2 tablets on Sundays, Tuesdays and Thursdays or as directed 135 tablet 3   No current facility-administered medications for this visit.     Allergies as of 03/03/2016  . (No Known Allergies)    Family History  Problem Relation Age of Onset  . Hypertension Mother   . Hypertension      Siblings  . Diabetes type II      Siblings  . Colon cancer Neg Hx     Social History   Social History  . Marital status: Single    Spouse name: N/A  . Number of children: N/A  . Years of education: N/A   Social History Main Topics  . Smoking status: Former Smoker    Types: Cigarettes    Quit date: 07/04/1998  . Smokeless tobacco: Never Used     Comment: Quit May 2000  . Alcohol use No     Comment: Quit 2014; Previously drank about 3 drinks a day  . Drug use: No  . Sexual activity: Not Asked   Other Topics Concern  . None   Social History Narrative  . None    Review of Systems: General: Negative for anorexia, weight loss, fever, chills, fatigue, weakness. CV: Negative for chest pain, angina, palpitations, peripheral edema.  Respiratory: Negative for dyspnea at rest, cough, sputum, wheezing.  GI: See history of present illness. Endo: Negative for unusual weight change.  Heme: Negative for bruising or bleeding. Allergy: Negative for rash or hives.   Physical Exam: BP 122/79   Pulse 95   Temp 97.1 F (36.2 C) (Oral)   Ht 6\' 2"  (1.88 m)   Wt 218 lb 9.6 oz (99.2 kg)   BMI 28.07 kg/m  General:   Alert and oriented. Pleasant and cooperative. Well-nourished and well-developed.  Eyes:  Without icterus, sclera clear and conjunctiva pink.  Ears:  Normal auditory  acuity. Cardiovascular:  S1, S2 present without murmurs appreciated. Extremities without clubbing or edema. Respiratory:  Clear to auscultation bilaterally. No wheezes, rales, or rhonchi. No distress.  Gastrointestinal:  +BS, soft, non-tender and non-distended. No HSM noted. No guarding or rebound. No masses appreciated.  Rectal:  Deferred  Musculoskalatal:  Symmetrical without gross deformities.  Psych:  Alert and cooperative. Normal mood and affect. Heme/Lymph/Immune: No excessive bruising noted.    03/03/2016 8:45 AM   Disclaimer: This note was dictated with voice recognition software. Similar sounding words can inadvertently be transcribed and may not be corrected upon review.

## 2016-03-03 NOTE — Patient Instructions (Signed)
1. I sent a prescription to [facility] for suppositories. 2. Keep taking the fiber supplement. 3. Return for follow-up as needed. Call us if you have significant worsening of rectal bleeding.  4. We will contact you in 5 years to arrange for your next colonoscopy.

## 2016-03-03 NOTE — Assessment & Plan Note (Signed)
Constipation significantly improved with Benefiber. Continue fiber supplement, return for follow-up as needed.

## 2016-03-03 NOTE — Progress Notes (Signed)
cc'ed to pcp °

## 2016-03-04 DIAGNOSIS — N186 End stage renal disease: Secondary | ICD-10-CM | POA: Diagnosis not present

## 2016-03-04 DIAGNOSIS — Z992 Dependence on renal dialysis: Secondary | ICD-10-CM | POA: Diagnosis not present

## 2016-03-06 DIAGNOSIS — Z992 Dependence on renal dialysis: Secondary | ICD-10-CM | POA: Diagnosis not present

## 2016-03-06 DIAGNOSIS — N186 End stage renal disease: Secondary | ICD-10-CM | POA: Diagnosis not present

## 2016-03-09 DIAGNOSIS — Z992 Dependence on renal dialysis: Secondary | ICD-10-CM | POA: Diagnosis not present

## 2016-03-09 DIAGNOSIS — N186 End stage renal disease: Secondary | ICD-10-CM | POA: Diagnosis not present

## 2016-03-11 DIAGNOSIS — Z992 Dependence on renal dialysis: Secondary | ICD-10-CM | POA: Diagnosis not present

## 2016-03-11 DIAGNOSIS — N186 End stage renal disease: Secondary | ICD-10-CM | POA: Diagnosis not present

## 2016-03-13 DIAGNOSIS — Z992 Dependence on renal dialysis: Secondary | ICD-10-CM | POA: Diagnosis not present

## 2016-03-13 DIAGNOSIS — N186 End stage renal disease: Secondary | ICD-10-CM | POA: Diagnosis not present

## 2016-03-16 DIAGNOSIS — N186 End stage renal disease: Secondary | ICD-10-CM | POA: Diagnosis not present

## 2016-03-16 DIAGNOSIS — Z992 Dependence on renal dialysis: Secondary | ICD-10-CM | POA: Diagnosis not present

## 2016-03-18 DIAGNOSIS — Z992 Dependence on renal dialysis: Secondary | ICD-10-CM | POA: Diagnosis not present

## 2016-03-18 DIAGNOSIS — N186 End stage renal disease: Secondary | ICD-10-CM | POA: Diagnosis not present

## 2016-03-20 DIAGNOSIS — N186 End stage renal disease: Secondary | ICD-10-CM | POA: Diagnosis not present

## 2016-03-20 DIAGNOSIS — Z992 Dependence on renal dialysis: Secondary | ICD-10-CM | POA: Diagnosis not present

## 2016-03-23 DIAGNOSIS — N186 End stage renal disease: Secondary | ICD-10-CM | POA: Diagnosis not present

## 2016-03-23 DIAGNOSIS — Z992 Dependence on renal dialysis: Secondary | ICD-10-CM | POA: Diagnosis not present

## 2016-03-24 DIAGNOSIS — N186 End stage renal disease: Secondary | ICD-10-CM | POA: Diagnosis not present

## 2016-03-24 DIAGNOSIS — Z992 Dependence on renal dialysis: Secondary | ICD-10-CM | POA: Diagnosis not present

## 2016-03-25 DIAGNOSIS — N186 End stage renal disease: Secondary | ICD-10-CM | POA: Diagnosis not present

## 2016-03-25 DIAGNOSIS — Z992 Dependence on renal dialysis: Secondary | ICD-10-CM | POA: Diagnosis not present

## 2016-03-27 DIAGNOSIS — Z992 Dependence on renal dialysis: Secondary | ICD-10-CM | POA: Diagnosis not present

## 2016-03-27 DIAGNOSIS — N186 End stage renal disease: Secondary | ICD-10-CM | POA: Diagnosis not present

## 2016-03-30 DIAGNOSIS — N186 End stage renal disease: Secondary | ICD-10-CM | POA: Diagnosis not present

## 2016-03-30 DIAGNOSIS — Z992 Dependence on renal dialysis: Secondary | ICD-10-CM | POA: Diagnosis not present

## 2016-03-31 ENCOUNTER — Ambulatory Visit (INDEPENDENT_AMBULATORY_CARE_PROVIDER_SITE_OTHER): Payer: Medicare HMO | Admitting: *Deleted

## 2016-03-31 DIAGNOSIS — Z5181 Encounter for therapeutic drug level monitoring: Secondary | ICD-10-CM

## 2016-03-31 DIAGNOSIS — I4891 Unspecified atrial fibrillation: Secondary | ICD-10-CM | POA: Diagnosis not present

## 2016-03-31 LAB — POCT INR: INR: 1.8

## 2016-04-01 DIAGNOSIS — Z992 Dependence on renal dialysis: Secondary | ICD-10-CM | POA: Diagnosis not present

## 2016-04-01 DIAGNOSIS — N186 End stage renal disease: Secondary | ICD-10-CM | POA: Diagnosis not present

## 2016-04-03 DIAGNOSIS — Z992 Dependence on renal dialysis: Secondary | ICD-10-CM | POA: Diagnosis not present

## 2016-04-03 DIAGNOSIS — N186 End stage renal disease: Secondary | ICD-10-CM | POA: Diagnosis not present

## 2016-04-05 ENCOUNTER — Other Ambulatory Visit: Payer: Self-pay | Admitting: Cardiology

## 2016-04-06 DIAGNOSIS — N186 End stage renal disease: Secondary | ICD-10-CM | POA: Diagnosis not present

## 2016-04-06 DIAGNOSIS — Z992 Dependence on renal dialysis: Secondary | ICD-10-CM | POA: Diagnosis not present

## 2016-04-08 DIAGNOSIS — N186 End stage renal disease: Secondary | ICD-10-CM | POA: Diagnosis not present

## 2016-04-08 DIAGNOSIS — Z992 Dependence on renal dialysis: Secondary | ICD-10-CM | POA: Diagnosis not present

## 2016-04-10 DIAGNOSIS — Z992 Dependence on renal dialysis: Secondary | ICD-10-CM | POA: Diagnosis not present

## 2016-04-10 DIAGNOSIS — N186 End stage renal disease: Secondary | ICD-10-CM | POA: Diagnosis not present

## 2016-04-13 DIAGNOSIS — N186 End stage renal disease: Secondary | ICD-10-CM | POA: Diagnosis not present

## 2016-04-13 DIAGNOSIS — Z992 Dependence on renal dialysis: Secondary | ICD-10-CM | POA: Diagnosis not present

## 2016-04-15 DIAGNOSIS — N186 End stage renal disease: Secondary | ICD-10-CM | POA: Diagnosis not present

## 2016-04-15 DIAGNOSIS — Z992 Dependence on renal dialysis: Secondary | ICD-10-CM | POA: Diagnosis not present

## 2016-04-17 DIAGNOSIS — N186 End stage renal disease: Secondary | ICD-10-CM | POA: Diagnosis not present

## 2016-04-17 DIAGNOSIS — Z992 Dependence on renal dialysis: Secondary | ICD-10-CM | POA: Diagnosis not present

## 2016-04-20 DIAGNOSIS — N186 End stage renal disease: Secondary | ICD-10-CM | POA: Diagnosis not present

## 2016-04-20 DIAGNOSIS — Z992 Dependence on renal dialysis: Secondary | ICD-10-CM | POA: Diagnosis not present

## 2016-04-21 ENCOUNTER — Ambulatory Visit (INDEPENDENT_AMBULATORY_CARE_PROVIDER_SITE_OTHER): Payer: Medicare HMO | Admitting: *Deleted

## 2016-04-21 DIAGNOSIS — Z5181 Encounter for therapeutic drug level monitoring: Secondary | ICD-10-CM | POA: Diagnosis not present

## 2016-04-21 DIAGNOSIS — N186 End stage renal disease: Secondary | ICD-10-CM | POA: Diagnosis not present

## 2016-04-21 DIAGNOSIS — Z992 Dependence on renal dialysis: Secondary | ICD-10-CM | POA: Diagnosis not present

## 2016-04-21 DIAGNOSIS — I4891 Unspecified atrial fibrillation: Secondary | ICD-10-CM

## 2016-04-21 LAB — POCT INR: INR: 2.7

## 2016-04-22 DIAGNOSIS — Z992 Dependence on renal dialysis: Secondary | ICD-10-CM | POA: Diagnosis not present

## 2016-04-22 DIAGNOSIS — N186 End stage renal disease: Secondary | ICD-10-CM | POA: Diagnosis not present

## 2016-04-24 DIAGNOSIS — N186 End stage renal disease: Secondary | ICD-10-CM | POA: Diagnosis not present

## 2016-04-24 DIAGNOSIS — Z992 Dependence on renal dialysis: Secondary | ICD-10-CM | POA: Diagnosis not present

## 2016-04-27 DIAGNOSIS — Z992 Dependence on renal dialysis: Secondary | ICD-10-CM | POA: Diagnosis not present

## 2016-04-27 DIAGNOSIS — N186 End stage renal disease: Secondary | ICD-10-CM | POA: Diagnosis not present

## 2016-04-29 DIAGNOSIS — Z992 Dependence on renal dialysis: Secondary | ICD-10-CM | POA: Diagnosis not present

## 2016-04-29 DIAGNOSIS — N186 End stage renal disease: Secondary | ICD-10-CM | POA: Diagnosis not present

## 2016-05-01 DIAGNOSIS — Z992 Dependence on renal dialysis: Secondary | ICD-10-CM | POA: Diagnosis not present

## 2016-05-01 DIAGNOSIS — N186 End stage renal disease: Secondary | ICD-10-CM | POA: Diagnosis not present

## 2016-05-04 DIAGNOSIS — N186 End stage renal disease: Secondary | ICD-10-CM | POA: Diagnosis not present

## 2016-05-04 DIAGNOSIS — Z992 Dependence on renal dialysis: Secondary | ICD-10-CM | POA: Diagnosis not present

## 2016-05-06 DIAGNOSIS — Z992 Dependence on renal dialysis: Secondary | ICD-10-CM | POA: Diagnosis not present

## 2016-05-06 DIAGNOSIS — N186 End stage renal disease: Secondary | ICD-10-CM | POA: Diagnosis not present

## 2016-05-07 DIAGNOSIS — N281 Cyst of kidney, acquired: Secondary | ICD-10-CM | POA: Diagnosis not present

## 2016-05-07 DIAGNOSIS — C642 Malignant neoplasm of left kidney, except renal pelvis: Secondary | ICD-10-CM | POA: Diagnosis not present

## 2016-05-07 DIAGNOSIS — C641 Malignant neoplasm of right kidney, except renal pelvis: Secondary | ICD-10-CM | POA: Diagnosis not present

## 2016-05-08 DIAGNOSIS — N186 End stage renal disease: Secondary | ICD-10-CM | POA: Diagnosis not present

## 2016-05-08 DIAGNOSIS — Z992 Dependence on renal dialysis: Secondary | ICD-10-CM | POA: Diagnosis not present

## 2016-05-10 DIAGNOSIS — D3000 Benign neoplasm of unspecified kidney: Secondary | ICD-10-CM | POA: Diagnosis not present

## 2016-05-11 DIAGNOSIS — N186 End stage renal disease: Secondary | ICD-10-CM | POA: Diagnosis not present

## 2016-05-11 DIAGNOSIS — Z992 Dependence on renal dialysis: Secondary | ICD-10-CM | POA: Diagnosis not present

## 2016-05-13 DIAGNOSIS — N186 End stage renal disease: Secondary | ICD-10-CM | POA: Diagnosis not present

## 2016-05-13 DIAGNOSIS — Z992 Dependence on renal dialysis: Secondary | ICD-10-CM | POA: Diagnosis not present

## 2016-05-15 DIAGNOSIS — N186 End stage renal disease: Secondary | ICD-10-CM | POA: Diagnosis not present

## 2016-05-15 DIAGNOSIS — Z992 Dependence on renal dialysis: Secondary | ICD-10-CM | POA: Diagnosis not present

## 2016-05-18 DIAGNOSIS — N186 End stage renal disease: Secondary | ICD-10-CM | POA: Diagnosis not present

## 2016-05-18 DIAGNOSIS — Z992 Dependence on renal dialysis: Secondary | ICD-10-CM | POA: Diagnosis not present

## 2016-05-19 ENCOUNTER — Ambulatory Visit (INDEPENDENT_AMBULATORY_CARE_PROVIDER_SITE_OTHER): Payer: Medicare HMO | Admitting: *Deleted

## 2016-05-19 DIAGNOSIS — Z5181 Encounter for therapeutic drug level monitoring: Secondary | ICD-10-CM

## 2016-05-19 DIAGNOSIS — I4891 Unspecified atrial fibrillation: Secondary | ICD-10-CM

## 2016-05-19 LAB — POCT INR: INR: 1.7

## 2016-05-20 DIAGNOSIS — Z992 Dependence on renal dialysis: Secondary | ICD-10-CM | POA: Diagnosis not present

## 2016-05-20 DIAGNOSIS — N186 End stage renal disease: Secondary | ICD-10-CM | POA: Diagnosis not present

## 2016-05-22 DIAGNOSIS — N186 End stage renal disease: Secondary | ICD-10-CM | POA: Diagnosis not present

## 2016-05-22 DIAGNOSIS — Z992 Dependence on renal dialysis: Secondary | ICD-10-CM | POA: Diagnosis not present

## 2016-05-23 DIAGNOSIS — Z992 Dependence on renal dialysis: Secondary | ICD-10-CM | POA: Diagnosis not present

## 2016-05-23 DIAGNOSIS — N186 End stage renal disease: Secondary | ICD-10-CM | POA: Diagnosis not present

## 2016-05-25 DIAGNOSIS — N186 End stage renal disease: Secondary | ICD-10-CM | POA: Diagnosis not present

## 2016-05-25 DIAGNOSIS — Z992 Dependence on renal dialysis: Secondary | ICD-10-CM | POA: Diagnosis not present

## 2016-05-27 DIAGNOSIS — N186 End stage renal disease: Secondary | ICD-10-CM | POA: Diagnosis not present

## 2016-05-27 DIAGNOSIS — Z992 Dependence on renal dialysis: Secondary | ICD-10-CM | POA: Diagnosis not present

## 2016-05-29 DIAGNOSIS — Z992 Dependence on renal dialysis: Secondary | ICD-10-CM | POA: Diagnosis not present

## 2016-05-29 DIAGNOSIS — N186 End stage renal disease: Secondary | ICD-10-CM | POA: Diagnosis not present

## 2016-06-01 DIAGNOSIS — E119 Type 2 diabetes mellitus without complications: Secondary | ICD-10-CM | POA: Diagnosis not present

## 2016-06-01 DIAGNOSIS — N186 End stage renal disease: Secondary | ICD-10-CM | POA: Diagnosis not present

## 2016-06-01 DIAGNOSIS — Z992 Dependence on renal dialysis: Secondary | ICD-10-CM | POA: Diagnosis not present

## 2016-06-03 DIAGNOSIS — N186 End stage renal disease: Secondary | ICD-10-CM | POA: Diagnosis not present

## 2016-06-03 DIAGNOSIS — Z992 Dependence on renal dialysis: Secondary | ICD-10-CM | POA: Diagnosis not present

## 2016-06-04 DIAGNOSIS — H04123 Dry eye syndrome of bilateral lacrimal glands: Secondary | ICD-10-CM | POA: Diagnosis not present

## 2016-06-04 DIAGNOSIS — H2513 Age-related nuclear cataract, bilateral: Secondary | ICD-10-CM | POA: Diagnosis not present

## 2016-06-04 DIAGNOSIS — E119 Type 2 diabetes mellitus without complications: Secondary | ICD-10-CM | POA: Diagnosis not present

## 2016-06-05 DIAGNOSIS — Z992 Dependence on renal dialysis: Secondary | ICD-10-CM | POA: Diagnosis not present

## 2016-06-05 DIAGNOSIS — N186 End stage renal disease: Secondary | ICD-10-CM | POA: Diagnosis not present

## 2016-06-08 DIAGNOSIS — Z992 Dependence on renal dialysis: Secondary | ICD-10-CM | POA: Diagnosis not present

## 2016-06-08 DIAGNOSIS — N186 End stage renal disease: Secondary | ICD-10-CM | POA: Diagnosis not present

## 2016-06-09 ENCOUNTER — Ambulatory Visit (INDEPENDENT_AMBULATORY_CARE_PROVIDER_SITE_OTHER): Payer: Medicare HMO | Admitting: *Deleted

## 2016-06-09 DIAGNOSIS — Z5181 Encounter for therapeutic drug level monitoring: Secondary | ICD-10-CM | POA: Diagnosis not present

## 2016-06-09 DIAGNOSIS — I4891 Unspecified atrial fibrillation: Secondary | ICD-10-CM | POA: Diagnosis not present

## 2016-06-09 LAB — POCT INR: INR: 1.9

## 2016-06-10 DIAGNOSIS — Z992 Dependence on renal dialysis: Secondary | ICD-10-CM | POA: Diagnosis not present

## 2016-06-10 DIAGNOSIS — N186 End stage renal disease: Secondary | ICD-10-CM | POA: Diagnosis not present

## 2016-06-12 DIAGNOSIS — Z992 Dependence on renal dialysis: Secondary | ICD-10-CM | POA: Diagnosis not present

## 2016-06-12 DIAGNOSIS — N186 End stage renal disease: Secondary | ICD-10-CM | POA: Diagnosis not present

## 2016-06-15 DIAGNOSIS — Z992 Dependence on renal dialysis: Secondary | ICD-10-CM | POA: Diagnosis not present

## 2016-06-15 DIAGNOSIS — N186 End stage renal disease: Secondary | ICD-10-CM | POA: Diagnosis not present

## 2016-06-17 DIAGNOSIS — Z992 Dependence on renal dialysis: Secondary | ICD-10-CM | POA: Diagnosis not present

## 2016-06-17 DIAGNOSIS — N186 End stage renal disease: Secondary | ICD-10-CM | POA: Diagnosis not present

## 2016-06-19 DIAGNOSIS — Z992 Dependence on renal dialysis: Secondary | ICD-10-CM | POA: Diagnosis not present

## 2016-06-19 DIAGNOSIS — N186 End stage renal disease: Secondary | ICD-10-CM | POA: Diagnosis not present

## 2016-06-21 DIAGNOSIS — Z992 Dependence on renal dialysis: Secondary | ICD-10-CM | POA: Diagnosis not present

## 2016-06-21 DIAGNOSIS — N186 End stage renal disease: Secondary | ICD-10-CM | POA: Diagnosis not present

## 2016-06-22 DIAGNOSIS — N186 End stage renal disease: Secondary | ICD-10-CM | POA: Diagnosis not present

## 2016-06-22 DIAGNOSIS — E8770 Fluid overload, unspecified: Secondary | ICD-10-CM | POA: Diagnosis not present

## 2016-06-22 DIAGNOSIS — Z992 Dependence on renal dialysis: Secondary | ICD-10-CM | POA: Diagnosis not present

## 2016-06-24 DIAGNOSIS — N186 End stage renal disease: Secondary | ICD-10-CM | POA: Diagnosis not present

## 2016-06-24 DIAGNOSIS — E8770 Fluid overload, unspecified: Secondary | ICD-10-CM | POA: Diagnosis not present

## 2016-06-24 DIAGNOSIS — Z992 Dependence on renal dialysis: Secondary | ICD-10-CM | POA: Diagnosis not present

## 2016-06-26 DIAGNOSIS — Z992 Dependence on renal dialysis: Secondary | ICD-10-CM | POA: Diagnosis not present

## 2016-06-26 DIAGNOSIS — E8770 Fluid overload, unspecified: Secondary | ICD-10-CM | POA: Diagnosis not present

## 2016-06-26 DIAGNOSIS — N186 End stage renal disease: Secondary | ICD-10-CM | POA: Diagnosis not present

## 2016-06-29 DIAGNOSIS — E8770 Fluid overload, unspecified: Secondary | ICD-10-CM | POA: Diagnosis not present

## 2016-06-29 DIAGNOSIS — Z992 Dependence on renal dialysis: Secondary | ICD-10-CM | POA: Diagnosis not present

## 2016-06-29 DIAGNOSIS — N186 End stage renal disease: Secondary | ICD-10-CM | POA: Diagnosis not present

## 2016-06-30 ENCOUNTER — Ambulatory Visit (INDEPENDENT_AMBULATORY_CARE_PROVIDER_SITE_OTHER): Payer: Medicare HMO | Admitting: *Deleted

## 2016-06-30 DIAGNOSIS — Z5181 Encounter for therapeutic drug level monitoring: Secondary | ICD-10-CM | POA: Diagnosis not present

## 2016-06-30 DIAGNOSIS — I4891 Unspecified atrial fibrillation: Secondary | ICD-10-CM | POA: Diagnosis not present

## 2016-06-30 LAB — POCT INR: INR: 2.3

## 2016-07-01 DIAGNOSIS — N186 End stage renal disease: Secondary | ICD-10-CM | POA: Diagnosis not present

## 2016-07-01 DIAGNOSIS — E8770 Fluid overload, unspecified: Secondary | ICD-10-CM | POA: Diagnosis not present

## 2016-07-01 DIAGNOSIS — Z992 Dependence on renal dialysis: Secondary | ICD-10-CM | POA: Diagnosis not present

## 2016-07-03 DIAGNOSIS — Z992 Dependence on renal dialysis: Secondary | ICD-10-CM | POA: Diagnosis not present

## 2016-07-03 DIAGNOSIS — E8770 Fluid overload, unspecified: Secondary | ICD-10-CM | POA: Diagnosis not present

## 2016-07-03 DIAGNOSIS — N186 End stage renal disease: Secondary | ICD-10-CM | POA: Diagnosis not present

## 2016-07-06 DIAGNOSIS — E8770 Fluid overload, unspecified: Secondary | ICD-10-CM | POA: Diagnosis not present

## 2016-07-06 DIAGNOSIS — Z992 Dependence on renal dialysis: Secondary | ICD-10-CM | POA: Diagnosis not present

## 2016-07-06 DIAGNOSIS — N186 End stage renal disease: Secondary | ICD-10-CM | POA: Diagnosis not present

## 2016-07-08 DIAGNOSIS — E8770 Fluid overload, unspecified: Secondary | ICD-10-CM | POA: Diagnosis not present

## 2016-07-08 DIAGNOSIS — N186 End stage renal disease: Secondary | ICD-10-CM | POA: Diagnosis not present

## 2016-07-08 DIAGNOSIS — Z992 Dependence on renal dialysis: Secondary | ICD-10-CM | POA: Diagnosis not present

## 2016-07-10 DIAGNOSIS — N186 End stage renal disease: Secondary | ICD-10-CM | POA: Diagnosis not present

## 2016-07-10 DIAGNOSIS — E8770 Fluid overload, unspecified: Secondary | ICD-10-CM | POA: Diagnosis not present

## 2016-07-10 DIAGNOSIS — Z992 Dependence on renal dialysis: Secondary | ICD-10-CM | POA: Diagnosis not present

## 2016-07-13 DIAGNOSIS — E8770 Fluid overload, unspecified: Secondary | ICD-10-CM | POA: Diagnosis not present

## 2016-07-13 DIAGNOSIS — Z992 Dependence on renal dialysis: Secondary | ICD-10-CM | POA: Diagnosis not present

## 2016-07-13 DIAGNOSIS — N186 End stage renal disease: Secondary | ICD-10-CM | POA: Diagnosis not present

## 2016-07-15 DIAGNOSIS — N186 End stage renal disease: Secondary | ICD-10-CM | POA: Diagnosis not present

## 2016-07-15 DIAGNOSIS — Z992 Dependence on renal dialysis: Secondary | ICD-10-CM | POA: Diagnosis not present

## 2016-07-15 DIAGNOSIS — E8770 Fluid overload, unspecified: Secondary | ICD-10-CM | POA: Diagnosis not present

## 2016-07-16 DIAGNOSIS — Z992 Dependence on renal dialysis: Secondary | ICD-10-CM | POA: Diagnosis not present

## 2016-07-16 DIAGNOSIS — E8770 Fluid overload, unspecified: Secondary | ICD-10-CM | POA: Diagnosis not present

## 2016-07-16 DIAGNOSIS — N186 End stage renal disease: Secondary | ICD-10-CM | POA: Diagnosis not present

## 2016-07-17 DIAGNOSIS — E8770 Fluid overload, unspecified: Secondary | ICD-10-CM | POA: Diagnosis not present

## 2016-07-17 DIAGNOSIS — Z992 Dependence on renal dialysis: Secondary | ICD-10-CM | POA: Diagnosis not present

## 2016-07-17 DIAGNOSIS — N186 End stage renal disease: Secondary | ICD-10-CM | POA: Diagnosis not present

## 2016-07-20 DIAGNOSIS — E8770 Fluid overload, unspecified: Secondary | ICD-10-CM | POA: Diagnosis not present

## 2016-07-20 DIAGNOSIS — Z992 Dependence on renal dialysis: Secondary | ICD-10-CM | POA: Diagnosis not present

## 2016-07-20 DIAGNOSIS — N186 End stage renal disease: Secondary | ICD-10-CM | POA: Diagnosis not present

## 2016-07-22 DIAGNOSIS — E8770 Fluid overload, unspecified: Secondary | ICD-10-CM | POA: Diagnosis not present

## 2016-07-22 DIAGNOSIS — Z992 Dependence on renal dialysis: Secondary | ICD-10-CM | POA: Diagnosis not present

## 2016-07-22 DIAGNOSIS — N186 End stage renal disease: Secondary | ICD-10-CM | POA: Diagnosis not present

## 2016-07-24 DIAGNOSIS — N186 End stage renal disease: Secondary | ICD-10-CM | POA: Diagnosis not present

## 2016-07-24 DIAGNOSIS — Z992 Dependence on renal dialysis: Secondary | ICD-10-CM | POA: Diagnosis not present

## 2016-07-27 DIAGNOSIS — Z992 Dependence on renal dialysis: Secondary | ICD-10-CM | POA: Diagnosis not present

## 2016-07-27 DIAGNOSIS — N186 End stage renal disease: Secondary | ICD-10-CM | POA: Diagnosis not present

## 2016-07-28 ENCOUNTER — Ambulatory Visit (INDEPENDENT_AMBULATORY_CARE_PROVIDER_SITE_OTHER): Payer: Medicare HMO | Admitting: *Deleted

## 2016-07-28 DIAGNOSIS — I4891 Unspecified atrial fibrillation: Secondary | ICD-10-CM | POA: Diagnosis not present

## 2016-07-28 DIAGNOSIS — Z5181 Encounter for therapeutic drug level monitoring: Secondary | ICD-10-CM | POA: Diagnosis not present

## 2016-07-28 LAB — POCT INR: INR: 2.2

## 2016-07-29 DIAGNOSIS — Z992 Dependence on renal dialysis: Secondary | ICD-10-CM | POA: Diagnosis not present

## 2016-07-29 DIAGNOSIS — N186 End stage renal disease: Secondary | ICD-10-CM | POA: Diagnosis not present

## 2016-07-31 DIAGNOSIS — N186 End stage renal disease: Secondary | ICD-10-CM | POA: Diagnosis not present

## 2016-07-31 DIAGNOSIS — Z992 Dependence on renal dialysis: Secondary | ICD-10-CM | POA: Diagnosis not present

## 2016-08-03 DIAGNOSIS — N186 End stage renal disease: Secondary | ICD-10-CM | POA: Diagnosis not present

## 2016-08-03 DIAGNOSIS — Z992 Dependence on renal dialysis: Secondary | ICD-10-CM | POA: Diagnosis not present

## 2016-08-05 DIAGNOSIS — N186 End stage renal disease: Secondary | ICD-10-CM | POA: Diagnosis not present

## 2016-08-05 DIAGNOSIS — Z992 Dependence on renal dialysis: Secondary | ICD-10-CM | POA: Diagnosis not present

## 2016-08-07 DIAGNOSIS — N186 End stage renal disease: Secondary | ICD-10-CM | POA: Diagnosis not present

## 2016-08-07 DIAGNOSIS — Z992 Dependence on renal dialysis: Secondary | ICD-10-CM | POA: Diagnosis not present

## 2016-08-10 DIAGNOSIS — N186 End stage renal disease: Secondary | ICD-10-CM | POA: Diagnosis not present

## 2016-08-10 DIAGNOSIS — Z992 Dependence on renal dialysis: Secondary | ICD-10-CM | POA: Diagnosis not present

## 2016-08-12 DIAGNOSIS — Z992 Dependence on renal dialysis: Secondary | ICD-10-CM | POA: Diagnosis not present

## 2016-08-12 DIAGNOSIS — N186 End stage renal disease: Secondary | ICD-10-CM | POA: Diagnosis not present

## 2016-08-14 DIAGNOSIS — Z992 Dependence on renal dialysis: Secondary | ICD-10-CM | POA: Diagnosis not present

## 2016-08-14 DIAGNOSIS — N186 End stage renal disease: Secondary | ICD-10-CM | POA: Diagnosis not present

## 2016-08-17 DIAGNOSIS — Z992 Dependence on renal dialysis: Secondary | ICD-10-CM | POA: Diagnosis not present

## 2016-08-17 DIAGNOSIS — N186 End stage renal disease: Secondary | ICD-10-CM | POA: Diagnosis not present

## 2016-08-19 DIAGNOSIS — N186 End stage renal disease: Secondary | ICD-10-CM | POA: Diagnosis not present

## 2016-08-19 DIAGNOSIS — Z992 Dependence on renal dialysis: Secondary | ICD-10-CM | POA: Diagnosis not present

## 2016-08-21 DIAGNOSIS — N186 End stage renal disease: Secondary | ICD-10-CM | POA: Diagnosis not present

## 2016-08-21 DIAGNOSIS — Z992 Dependence on renal dialysis: Secondary | ICD-10-CM | POA: Diagnosis not present

## 2016-08-22 DIAGNOSIS — E8779 Other fluid overload: Secondary | ICD-10-CM | POA: Diagnosis not present

## 2016-08-22 DIAGNOSIS — N186 End stage renal disease: Secondary | ICD-10-CM | POA: Diagnosis not present

## 2016-08-22 DIAGNOSIS — Z992 Dependence on renal dialysis: Secondary | ICD-10-CM | POA: Diagnosis not present

## 2016-08-24 DIAGNOSIS — N186 End stage renal disease: Secondary | ICD-10-CM | POA: Diagnosis not present

## 2016-08-24 DIAGNOSIS — E8779 Other fluid overload: Secondary | ICD-10-CM | POA: Diagnosis not present

## 2016-08-24 DIAGNOSIS — Z992 Dependence on renal dialysis: Secondary | ICD-10-CM | POA: Diagnosis not present

## 2016-08-26 DIAGNOSIS — N186 End stage renal disease: Secondary | ICD-10-CM | POA: Diagnosis not present

## 2016-08-26 DIAGNOSIS — Z992 Dependence on renal dialysis: Secondary | ICD-10-CM | POA: Diagnosis not present

## 2016-08-26 DIAGNOSIS — E8779 Other fluid overload: Secondary | ICD-10-CM | POA: Diagnosis not present

## 2016-08-28 DIAGNOSIS — E8779 Other fluid overload: Secondary | ICD-10-CM | POA: Diagnosis not present

## 2016-08-28 DIAGNOSIS — N186 End stage renal disease: Secondary | ICD-10-CM | POA: Diagnosis not present

## 2016-08-28 DIAGNOSIS — Z992 Dependence on renal dialysis: Secondary | ICD-10-CM | POA: Diagnosis not present

## 2016-08-31 DIAGNOSIS — E8779 Other fluid overload: Secondary | ICD-10-CM | POA: Diagnosis not present

## 2016-08-31 DIAGNOSIS — E119 Type 2 diabetes mellitus without complications: Secondary | ICD-10-CM | POA: Diagnosis not present

## 2016-08-31 DIAGNOSIS — N186 End stage renal disease: Secondary | ICD-10-CM | POA: Diagnosis not present

## 2016-08-31 DIAGNOSIS — Z992 Dependence on renal dialysis: Secondary | ICD-10-CM | POA: Diagnosis not present

## 2016-09-01 ENCOUNTER — Ambulatory Visit (INDEPENDENT_AMBULATORY_CARE_PROVIDER_SITE_OTHER): Payer: Medicare HMO | Admitting: *Deleted

## 2016-09-01 DIAGNOSIS — I4891 Unspecified atrial fibrillation: Secondary | ICD-10-CM | POA: Diagnosis not present

## 2016-09-01 DIAGNOSIS — Z5181 Encounter for therapeutic drug level monitoring: Secondary | ICD-10-CM

## 2016-09-01 LAB — POCT INR: INR: 2.2

## 2016-09-02 DIAGNOSIS — N186 End stage renal disease: Secondary | ICD-10-CM | POA: Diagnosis not present

## 2016-09-02 DIAGNOSIS — E8779 Other fluid overload: Secondary | ICD-10-CM | POA: Diagnosis not present

## 2016-09-02 DIAGNOSIS — Z992 Dependence on renal dialysis: Secondary | ICD-10-CM | POA: Diagnosis not present

## 2016-09-04 DIAGNOSIS — E8779 Other fluid overload: Secondary | ICD-10-CM | POA: Diagnosis not present

## 2016-09-04 DIAGNOSIS — N186 End stage renal disease: Secondary | ICD-10-CM | POA: Diagnosis not present

## 2016-09-04 DIAGNOSIS — Z992 Dependence on renal dialysis: Secondary | ICD-10-CM | POA: Diagnosis not present

## 2016-09-07 DIAGNOSIS — E8779 Other fluid overload: Secondary | ICD-10-CM | POA: Diagnosis not present

## 2016-09-07 DIAGNOSIS — Z992 Dependence on renal dialysis: Secondary | ICD-10-CM | POA: Diagnosis not present

## 2016-09-07 DIAGNOSIS — N186 End stage renal disease: Secondary | ICD-10-CM | POA: Diagnosis not present

## 2016-09-09 DIAGNOSIS — E8779 Other fluid overload: Secondary | ICD-10-CM | POA: Diagnosis not present

## 2016-09-09 DIAGNOSIS — N186 End stage renal disease: Secondary | ICD-10-CM | POA: Diagnosis not present

## 2016-09-09 DIAGNOSIS — Z992 Dependence on renal dialysis: Secondary | ICD-10-CM | POA: Diagnosis not present

## 2016-09-11 DIAGNOSIS — Z992 Dependence on renal dialysis: Secondary | ICD-10-CM | POA: Diagnosis not present

## 2016-09-11 DIAGNOSIS — N186 End stage renal disease: Secondary | ICD-10-CM | POA: Diagnosis not present

## 2016-09-11 DIAGNOSIS — E8779 Other fluid overload: Secondary | ICD-10-CM | POA: Diagnosis not present

## 2016-09-14 DIAGNOSIS — E8779 Other fluid overload: Secondary | ICD-10-CM | POA: Diagnosis not present

## 2016-09-14 DIAGNOSIS — Z992 Dependence on renal dialysis: Secondary | ICD-10-CM | POA: Diagnosis not present

## 2016-09-14 DIAGNOSIS — N186 End stage renal disease: Secondary | ICD-10-CM | POA: Diagnosis not present

## 2016-09-16 DIAGNOSIS — Z992 Dependence on renal dialysis: Secondary | ICD-10-CM | POA: Diagnosis not present

## 2016-09-16 DIAGNOSIS — N186 End stage renal disease: Secondary | ICD-10-CM | POA: Diagnosis not present

## 2016-09-16 DIAGNOSIS — E8779 Other fluid overload: Secondary | ICD-10-CM | POA: Diagnosis not present

## 2016-09-17 DIAGNOSIS — E8779 Other fluid overload: Secondary | ICD-10-CM | POA: Diagnosis not present

## 2016-09-17 DIAGNOSIS — N186 End stage renal disease: Secondary | ICD-10-CM | POA: Diagnosis not present

## 2016-09-17 DIAGNOSIS — Z992 Dependence on renal dialysis: Secondary | ICD-10-CM | POA: Diagnosis not present

## 2016-09-18 DIAGNOSIS — N186 End stage renal disease: Secondary | ICD-10-CM | POA: Diagnosis not present

## 2016-09-18 DIAGNOSIS — Z992 Dependence on renal dialysis: Secondary | ICD-10-CM | POA: Diagnosis not present

## 2016-09-18 DIAGNOSIS — E8779 Other fluid overload: Secondary | ICD-10-CM | POA: Diagnosis not present

## 2016-09-21 DIAGNOSIS — E8779 Other fluid overload: Secondary | ICD-10-CM | POA: Diagnosis not present

## 2016-09-21 DIAGNOSIS — Z992 Dependence on renal dialysis: Secondary | ICD-10-CM | POA: Diagnosis not present

## 2016-09-21 DIAGNOSIS — N186 End stage renal disease: Secondary | ICD-10-CM | POA: Diagnosis not present

## 2016-09-22 DIAGNOSIS — E8779 Other fluid overload: Secondary | ICD-10-CM | POA: Diagnosis not present

## 2016-09-22 DIAGNOSIS — N186 End stage renal disease: Secondary | ICD-10-CM | POA: Diagnosis not present

## 2016-09-22 DIAGNOSIS — Z992 Dependence on renal dialysis: Secondary | ICD-10-CM | POA: Diagnosis not present

## 2016-09-23 DIAGNOSIS — N186 End stage renal disease: Secondary | ICD-10-CM | POA: Diagnosis not present

## 2016-09-23 DIAGNOSIS — E8779 Other fluid overload: Secondary | ICD-10-CM | POA: Diagnosis not present

## 2016-09-23 DIAGNOSIS — Z992 Dependence on renal dialysis: Secondary | ICD-10-CM | POA: Diagnosis not present

## 2016-09-25 DIAGNOSIS — Z992 Dependence on renal dialysis: Secondary | ICD-10-CM | POA: Diagnosis not present

## 2016-09-25 DIAGNOSIS — N186 End stage renal disease: Secondary | ICD-10-CM | POA: Diagnosis not present

## 2016-09-25 DIAGNOSIS — E8779 Other fluid overload: Secondary | ICD-10-CM | POA: Diagnosis not present

## 2016-09-28 DIAGNOSIS — E8779 Other fluid overload: Secondary | ICD-10-CM | POA: Diagnosis not present

## 2016-09-28 DIAGNOSIS — N186 End stage renal disease: Secondary | ICD-10-CM | POA: Diagnosis not present

## 2016-09-28 DIAGNOSIS — Z992 Dependence on renal dialysis: Secondary | ICD-10-CM | POA: Diagnosis not present

## 2016-10-02 DIAGNOSIS — E8779 Other fluid overload: Secondary | ICD-10-CM | POA: Diagnosis not present

## 2016-10-02 DIAGNOSIS — N186 End stage renal disease: Secondary | ICD-10-CM | POA: Diagnosis not present

## 2016-10-02 DIAGNOSIS — Z992 Dependence on renal dialysis: Secondary | ICD-10-CM | POA: Diagnosis not present

## 2016-10-04 DIAGNOSIS — N186 End stage renal disease: Secondary | ICD-10-CM | POA: Diagnosis not present

## 2016-10-04 DIAGNOSIS — Z992 Dependence on renal dialysis: Secondary | ICD-10-CM | POA: Diagnosis not present

## 2016-10-04 DIAGNOSIS — E8779 Other fluid overload: Secondary | ICD-10-CM | POA: Diagnosis not present

## 2016-10-05 DIAGNOSIS — E8779 Other fluid overload: Secondary | ICD-10-CM | POA: Diagnosis not present

## 2016-10-05 DIAGNOSIS — N186 End stage renal disease: Secondary | ICD-10-CM | POA: Diagnosis not present

## 2016-10-05 DIAGNOSIS — Z992 Dependence on renal dialysis: Secondary | ICD-10-CM | POA: Diagnosis not present

## 2016-10-07 DIAGNOSIS — E8779 Other fluid overload: Secondary | ICD-10-CM | POA: Diagnosis not present

## 2016-10-07 DIAGNOSIS — N186 End stage renal disease: Secondary | ICD-10-CM | POA: Diagnosis not present

## 2016-10-07 DIAGNOSIS — Z992 Dependence on renal dialysis: Secondary | ICD-10-CM | POA: Diagnosis not present

## 2016-10-09 DIAGNOSIS — Z992 Dependence on renal dialysis: Secondary | ICD-10-CM | POA: Diagnosis not present

## 2016-10-09 DIAGNOSIS — E8779 Other fluid overload: Secondary | ICD-10-CM | POA: Diagnosis not present

## 2016-10-09 DIAGNOSIS — N186 End stage renal disease: Secondary | ICD-10-CM | POA: Diagnosis not present

## 2016-10-12 DIAGNOSIS — Z992 Dependence on renal dialysis: Secondary | ICD-10-CM | POA: Diagnosis not present

## 2016-10-12 DIAGNOSIS — E8779 Other fluid overload: Secondary | ICD-10-CM | POA: Diagnosis not present

## 2016-10-12 DIAGNOSIS — N186 End stage renal disease: Secondary | ICD-10-CM | POA: Diagnosis not present

## 2016-10-13 ENCOUNTER — Ambulatory Visit (INDEPENDENT_AMBULATORY_CARE_PROVIDER_SITE_OTHER): Payer: Medicare HMO | Admitting: *Deleted

## 2016-10-13 DIAGNOSIS — I4891 Unspecified atrial fibrillation: Secondary | ICD-10-CM

## 2016-10-13 DIAGNOSIS — Z5181 Encounter for therapeutic drug level monitoring: Secondary | ICD-10-CM

## 2016-10-13 LAB — POCT INR: INR: 2

## 2016-10-14 DIAGNOSIS — N186 End stage renal disease: Secondary | ICD-10-CM | POA: Diagnosis not present

## 2016-10-14 DIAGNOSIS — E8779 Other fluid overload: Secondary | ICD-10-CM | POA: Diagnosis not present

## 2016-10-14 DIAGNOSIS — Z992 Dependence on renal dialysis: Secondary | ICD-10-CM | POA: Diagnosis not present

## 2016-10-16 DIAGNOSIS — N186 End stage renal disease: Secondary | ICD-10-CM | POA: Diagnosis not present

## 2016-10-16 DIAGNOSIS — E8779 Other fluid overload: Secondary | ICD-10-CM | POA: Diagnosis not present

## 2016-10-16 DIAGNOSIS — Z992 Dependence on renal dialysis: Secondary | ICD-10-CM | POA: Diagnosis not present

## 2016-10-19 DIAGNOSIS — N186 End stage renal disease: Secondary | ICD-10-CM | POA: Diagnosis not present

## 2016-10-19 DIAGNOSIS — E8779 Other fluid overload: Secondary | ICD-10-CM | POA: Diagnosis not present

## 2016-10-19 DIAGNOSIS — Z992 Dependence on renal dialysis: Secondary | ICD-10-CM | POA: Diagnosis not present

## 2016-10-21 DIAGNOSIS — Z992 Dependence on renal dialysis: Secondary | ICD-10-CM | POA: Diagnosis not present

## 2016-10-21 DIAGNOSIS — N186 End stage renal disease: Secondary | ICD-10-CM | POA: Diagnosis not present

## 2016-10-21 DIAGNOSIS — E8779 Other fluid overload: Secondary | ICD-10-CM | POA: Diagnosis not present

## 2016-10-22 DIAGNOSIS — Z992 Dependence on renal dialysis: Secondary | ICD-10-CM | POA: Diagnosis not present

## 2016-10-22 DIAGNOSIS — N186 End stage renal disease: Secondary | ICD-10-CM | POA: Diagnosis not present

## 2016-10-23 DIAGNOSIS — Z992 Dependence on renal dialysis: Secondary | ICD-10-CM | POA: Diagnosis not present

## 2016-10-23 DIAGNOSIS — N186 End stage renal disease: Secondary | ICD-10-CM | POA: Diagnosis not present

## 2016-10-25 DIAGNOSIS — Z992 Dependence on renal dialysis: Secondary | ICD-10-CM | POA: Diagnosis not present

## 2016-10-25 DIAGNOSIS — N186 End stage renal disease: Secondary | ICD-10-CM | POA: Diagnosis not present

## 2016-10-26 DIAGNOSIS — Z992 Dependence on renal dialysis: Secondary | ICD-10-CM | POA: Diagnosis not present

## 2016-10-26 DIAGNOSIS — N186 End stage renal disease: Secondary | ICD-10-CM | POA: Diagnosis not present

## 2016-10-28 DIAGNOSIS — N186 End stage renal disease: Secondary | ICD-10-CM | POA: Diagnosis not present

## 2016-10-28 DIAGNOSIS — Z992 Dependence on renal dialysis: Secondary | ICD-10-CM | POA: Diagnosis not present

## 2016-10-30 DIAGNOSIS — N186 End stage renal disease: Secondary | ICD-10-CM | POA: Diagnosis not present

## 2016-10-30 DIAGNOSIS — Z992 Dependence on renal dialysis: Secondary | ICD-10-CM | POA: Diagnosis not present

## 2016-11-02 DIAGNOSIS — N186 End stage renal disease: Secondary | ICD-10-CM | POA: Diagnosis not present

## 2016-11-02 DIAGNOSIS — Z992 Dependence on renal dialysis: Secondary | ICD-10-CM | POA: Diagnosis not present

## 2016-11-04 DIAGNOSIS — N186 End stage renal disease: Secondary | ICD-10-CM | POA: Diagnosis not present

## 2016-11-04 DIAGNOSIS — Z992 Dependence on renal dialysis: Secondary | ICD-10-CM | POA: Diagnosis not present

## 2016-11-06 DIAGNOSIS — N186 End stage renal disease: Secondary | ICD-10-CM | POA: Diagnosis not present

## 2016-11-06 DIAGNOSIS — Z992 Dependence on renal dialysis: Secondary | ICD-10-CM | POA: Diagnosis not present

## 2016-11-09 DIAGNOSIS — Z992 Dependence on renal dialysis: Secondary | ICD-10-CM | POA: Diagnosis not present

## 2016-11-09 DIAGNOSIS — N186 End stage renal disease: Secondary | ICD-10-CM | POA: Diagnosis not present

## 2016-11-11 DIAGNOSIS — N186 End stage renal disease: Secondary | ICD-10-CM | POA: Diagnosis not present

## 2016-11-11 DIAGNOSIS — Z992 Dependence on renal dialysis: Secondary | ICD-10-CM | POA: Diagnosis not present

## 2016-11-13 DIAGNOSIS — N186 End stage renal disease: Secondary | ICD-10-CM | POA: Diagnosis not present

## 2016-11-13 DIAGNOSIS — Z992 Dependence on renal dialysis: Secondary | ICD-10-CM | POA: Diagnosis not present

## 2016-11-16 DIAGNOSIS — Z992 Dependence on renal dialysis: Secondary | ICD-10-CM | POA: Diagnosis not present

## 2016-11-16 DIAGNOSIS — N186 End stage renal disease: Secondary | ICD-10-CM | POA: Diagnosis not present

## 2016-11-18 DIAGNOSIS — N186 End stage renal disease: Secondary | ICD-10-CM | POA: Diagnosis not present

## 2016-11-18 DIAGNOSIS — Z992 Dependence on renal dialysis: Secondary | ICD-10-CM | POA: Diagnosis not present

## 2016-11-20 DIAGNOSIS — N186 End stage renal disease: Secondary | ICD-10-CM | POA: Diagnosis not present

## 2016-11-20 DIAGNOSIS — Z992 Dependence on renal dialysis: Secondary | ICD-10-CM | POA: Diagnosis not present

## 2016-11-21 DIAGNOSIS — Z992 Dependence on renal dialysis: Secondary | ICD-10-CM | POA: Diagnosis not present

## 2016-11-21 DIAGNOSIS — N186 End stage renal disease: Secondary | ICD-10-CM | POA: Diagnosis not present

## 2016-11-22 DIAGNOSIS — Z992 Dependence on renal dialysis: Secondary | ICD-10-CM | POA: Diagnosis not present

## 2016-11-22 DIAGNOSIS — N186 End stage renal disease: Secondary | ICD-10-CM | POA: Diagnosis not present

## 2016-11-23 DIAGNOSIS — Z992 Dependence on renal dialysis: Secondary | ICD-10-CM | POA: Diagnosis not present

## 2016-11-23 DIAGNOSIS — N186 End stage renal disease: Secondary | ICD-10-CM | POA: Diagnosis not present

## 2016-11-24 ENCOUNTER — Other Ambulatory Visit: Payer: Self-pay

## 2016-11-24 ENCOUNTER — Ambulatory Visit (INDEPENDENT_AMBULATORY_CARE_PROVIDER_SITE_OTHER): Payer: Medicare HMO | Admitting: *Deleted

## 2016-11-24 DIAGNOSIS — Z5181 Encounter for therapeutic drug level monitoring: Secondary | ICD-10-CM | POA: Diagnosis not present

## 2016-11-24 DIAGNOSIS — I4891 Unspecified atrial fibrillation: Secondary | ICD-10-CM | POA: Diagnosis not present

## 2016-11-24 DIAGNOSIS — N186 End stage renal disease: Secondary | ICD-10-CM | POA: Diagnosis not present

## 2016-11-24 DIAGNOSIS — Z992 Dependence on renal dialysis: Secondary | ICD-10-CM | POA: Diagnosis not present

## 2016-11-24 DIAGNOSIS — I428 Other cardiomyopathies: Secondary | ICD-10-CM

## 2016-11-24 LAB — POCT INR: INR: 1.8

## 2016-11-25 DIAGNOSIS — Z992 Dependence on renal dialysis: Secondary | ICD-10-CM | POA: Diagnosis not present

## 2016-11-25 DIAGNOSIS — N186 End stage renal disease: Secondary | ICD-10-CM | POA: Diagnosis not present

## 2016-11-26 ENCOUNTER — Ambulatory Visit (HOSPITAL_COMMUNITY)
Admission: RE | Admit: 2016-11-26 | Discharge: 2016-11-26 | Disposition: A | Discharge status: Home / Self Care | Payer: Medicare HMO | Source: Ambulatory Visit | Attending: Cardiology | Admitting: Cardiology

## 2016-11-26 DIAGNOSIS — E785 Hyperlipidemia, unspecified: Secondary | ICD-10-CM | POA: Diagnosis not present

## 2016-11-26 DIAGNOSIS — Z87891 Personal history of nicotine dependence: Secondary | ICD-10-CM | POA: Diagnosis not present

## 2016-11-26 DIAGNOSIS — I428 Other cardiomyopathies: Secondary | ICD-10-CM | POA: Diagnosis not present

## 2016-11-26 DIAGNOSIS — R001 Bradycardia, unspecified: Secondary | ICD-10-CM | POA: Diagnosis not present

## 2016-11-26 DIAGNOSIS — I77819 Aortic ectasia, unspecified site: Secondary | ICD-10-CM | POA: Insufficient documentation

## 2016-11-26 DIAGNOSIS — N186 End stage renal disease: Secondary | ICD-10-CM | POA: Diagnosis not present

## 2016-11-26 DIAGNOSIS — I4891 Unspecified atrial fibrillation: Secondary | ICD-10-CM | POA: Diagnosis not present

## 2016-11-26 DIAGNOSIS — I081 Rheumatic disorders of both mitral and tricuspid valves: Secondary | ICD-10-CM | POA: Insufficient documentation

## 2016-11-26 DIAGNOSIS — I120 Hypertensive chronic kidney disease with stage 5 chronic kidney disease or end stage renal disease: Secondary | ICD-10-CM | POA: Diagnosis not present

## 2016-11-26 NOTE — Progress Notes (Signed)
*  PRELIMINARY RESULTS* Echocardiogram 2D Echocardiogram has been performed.  Leavy Cella 11/26/2016, 9:02 AM

## 2016-11-27 DIAGNOSIS — N186 End stage renal disease: Secondary | ICD-10-CM | POA: Diagnosis not present

## 2016-11-27 DIAGNOSIS — Z992 Dependence on renal dialysis: Secondary | ICD-10-CM | POA: Diagnosis not present

## 2016-11-30 DIAGNOSIS — Z992 Dependence on renal dialysis: Secondary | ICD-10-CM | POA: Diagnosis not present

## 2016-11-30 DIAGNOSIS — N186 End stage renal disease: Secondary | ICD-10-CM | POA: Diagnosis not present

## 2016-12-02 DIAGNOSIS — Z992 Dependence on renal dialysis: Secondary | ICD-10-CM | POA: Diagnosis not present

## 2016-12-02 DIAGNOSIS — N186 End stage renal disease: Secondary | ICD-10-CM | POA: Diagnosis not present

## 2016-12-05 DIAGNOSIS — N186 End stage renal disease: Secondary | ICD-10-CM | POA: Diagnosis not present

## 2016-12-05 DIAGNOSIS — Z992 Dependence on renal dialysis: Secondary | ICD-10-CM | POA: Diagnosis not present

## 2016-12-06 DIAGNOSIS — I10 Essential (primary) hypertension: Secondary | ICD-10-CM | POA: Diagnosis not present

## 2016-12-06 DIAGNOSIS — Z6830 Body mass index (BMI) 30.0-30.9, adult: Secondary | ICD-10-CM | POA: Diagnosis not present

## 2016-12-06 DIAGNOSIS — Z1389 Encounter for screening for other disorder: Secondary | ICD-10-CM | POA: Diagnosis not present

## 2016-12-06 DIAGNOSIS — Z23 Encounter for immunization: Secondary | ICD-10-CM | POA: Diagnosis not present

## 2016-12-06 DIAGNOSIS — E1129 Type 2 diabetes mellitus with other diabetic kidney complication: Secondary | ICD-10-CM | POA: Diagnosis not present

## 2016-12-06 DIAGNOSIS — E6609 Other obesity due to excess calories: Secondary | ICD-10-CM | POA: Diagnosis not present

## 2016-12-06 DIAGNOSIS — E782 Mixed hyperlipidemia: Secondary | ICD-10-CM | POA: Diagnosis not present

## 2016-12-07 ENCOUNTER — Encounter: Payer: Self-pay | Admitting: *Deleted

## 2016-12-07 DIAGNOSIS — N186 End stage renal disease: Secondary | ICD-10-CM | POA: Diagnosis not present

## 2016-12-07 DIAGNOSIS — Z992 Dependence on renal dialysis: Secondary | ICD-10-CM | POA: Diagnosis not present

## 2016-12-07 NOTE — Progress Notes (Signed)
Cardiology Office Note  Date: 12/08/2016   ID: Albert [name], DOB 1955-11-27, MRN [id number]  PCP: Redmond School, MD  Primary Cardiologist: Rozann Lesches, MD   Chief Complaint  Patient presents with  . History of cardiomyopathy  . Atrial Fibrillation    History of Present Illness: Albert [name] is a 61 y.o. male last seen in June 2017. He presents for a routine follow-up visit. He is not reporting any chest pain or progressive palpitations since last visit. He describes stable NYHA class II dyspnea. He continues to dialyze on Tuesday, Thursday, and Saturday. He is followed by Dr. Lowanda Foster. He tells me that he has had low blood pressures with dialysis despite holding Coreg and diltiazem on his dialysis days.  He continues to follow in the anticoagulation clinic on Coumadin. Most recent INR was 1.8.  Recent follow-up echocardiogram is outlined below, LVEF 50-55% range. I went over the results with him today.  Past Medical History:  Diagnosis Date  . Anemia   . Atrial fibrillation ([place])   . Cardiomyopathy    LVEF 50-55% 8/11 - [facility]  . Colonic polyp   . Diverticulosis of colon   . ESRD on hemodialysis ([place])    Dr. [facility]anda Foster [facility]  . Essential hypertension, benign   . Gout   . Mixed hyperlipidemia   . OSA (obstructive sleep apnea)    CPAP  . Renal cell cancer ([place])   . Type 2 diabetes mellitus ([place])    Type II    Past Surgical History:  Procedure Laterality Date  . COLONOSCOPY N/A 01/07/2016   Procedure: COLONOSCOPY;  Surgeon: Daneil Dolin, MD;  [facility];  Service: Endoscopy;  Laterality: N/A;  100  . COLONOSCOPY W/ POLYPECTOMY    . FISTULA SUPERFICIALIZATION Left 03/31/2015   Procedure: CEPHALIC VEIN TURNDOWN; PLICATION OF BRACHIOCEPHALIC AVF;  Surgeon: Conrad Interior, MD;  Location: [place];  Service: Vascular;  Laterality: Left;  . FISTULOGRAM N/A 10/19/2013   Procedure: FISTULOGRAM;  Surgeon: Rosetta Posner, MD;  Location: [facility] CATH LAB;   Service: Cardiovascular;  Laterality: N/A;  . Left arm AV fistula  2009   Dr. [name]  . PERIPHERAL VASCULAR CATHETERIZATION N/A 03/10/2015   Procedure: Fistulagram;  Surgeon: Conrad [place], MD;  Location: [place] CV LAB;  Service: Cardiovascular;  Laterality: N/A;  . Right nephrectomy      Current Outpatient Prescriptions  Medication Sig Dispense Refill  . allopurinol (ZYLOPRIM) 100 MG tablet Take 100 mg by mouth daily.     . carvedilol (COREG) 25 MG tablet TAKE 1 TABLET TWICE DAILY WITH MEALS (Patient taking differently: take 1/2 tablet two times daily) 180 tablet 3  . cinacalcet (SENSIPAR) 30 MG tablet Take 30 mg by mouth at bedtime.     Marland Kitchen diltiazem (TIAZAC) 300 MG 24 hr capsule TAKE 1 CAPSULE EVERY DAY (Patient taking differently: 180 mg) 90 capsule 2  . docusate sodium (COLACE) 100 MG capsule Take 100 mg by mouth daily.    . fenofibrate 160 MG tablet Take 160 mg by mouth daily.    . fish oil-omega-3 fatty acids 1000 MG capsule Take 1 g by mouth 3 (three) times daily.     Marland Kitchen linagliptin (TRADJENTA) 5 MG TABS tablet Take 5 mg by mouth daily.    . pravastatin (PRAVACHOL) 10 MG tablet Take 5 mg by mouth every evening.     . sevelamer carbonate (RENVELA) 800 MG tablet Take 800-1,600 mg by mouth as directed.  Take 2 tablets after each meal and 1 after each snack  (8 tablets daily)     . Tetrahydrozoline HCl (VISINE OP) Place 2 drops into both eyes daily as needed (dry eyes).    . warfarin (COUMADIN) 2.5 MG tablet Take 1 tablet daily except 1 1/2 tablets on Sundays, Tuesdays and Thursdays or as directed 135 tablet 3   No current facility-administered medications for this visit.    Allergies:  Patient has no known allergies.   Social History: The patient  reports that he quit smoking about 18 years ago. His smoking use included Cigarettes. He has never used smokeless tobacco. He reports that he does not drink alcohol or use drugs.   ROS:  Please see the history of present illness.  Otherwise, complete review of systems is positive for none.  All other systems are reviewed and negative.   Physical Exam: VS:  BP 106/68 (BP Location: Right Arm)   Pulse 91   Ht 6\' 2"  (1.88 m)   Wt 236 lb (107 kg)   SpO2 98%   BMI 30.30 kg/m , BMI Body mass index is 30.3 kg/m.  Wt Readings from Last 3 Encounters:  12/08/16 236 lb (107 kg)  03/03/16 218 lb 9.6 oz (99.2 kg)  01/07/16 222 lb (100.7 kg)    General: Obese male, appears comfortable at rest. HEENT: Conjunctiva and lids normal, oropharynx clear. Neck: Supple, no elevated JVP or carotid bruits, no thyromegaly. Lungs: Clear to auscultation, nonlabored breathing at rest. Cardiac: Irregularly irregular, no S3, soft systolic murmur, no pericardial rub. Abdomen: Soft, nontender, bowel sounds present, no guarding or rebound. Extremities: Left arm AV fistula. No pitting edema, distal pulses 2+. Skin: Warm and dry. Musculoskeletal: No kyphosis. Neuropsychiatric: Alert and oriented x3, affect grossly appropriate.  ECG: I personally reviewed the tracing from 03/10/2015 which showed rate-controlled atrial fibrillation with nonspecific ST-T changes.  Other Studies Reviewed Today:  Echocardiogram 11/26/2016: Study Conclusions  - Left ventricle: The cavity size was normal. Wall thickness was   increased in a pattern of mild LVH. Systolic function was normal.   The estimated ejection fraction was in the range of 50% to 55%.   Wall motion was normal; there were no regional wall motion   abnormalities. The study was not technically sufficient to allow   evaluation of LV diastolic dysfunction due to atrial   fibrillation. Indeterminate filling pressures. - Aorta: Mild aortic root dilatation. Diameter 4 cm. - Mitral valve: Calcified annulus. Normal thickness leaflets . - Left atrium: The atrium was moderately dilated. - Right ventricle: The cavity size was mildly dilated. Systolic   function was mildly reduced. - Right atrium: The  atrium was mildly dilated. - Tricuspid valve: There was mild regurgitation.  Assessment and Plan:  1. Nonischemic cardiomyopathy with low normal LVEF 50-55% by recent follow-up echocardiogram. He continues on both Coreg and Cardizem CD for heart rate control of atrial fibrillation which led to improvement in LVEF.  2. Chronic atrial fibrillation, heart rate controlled and continues on Coumadin for stroke prophylaxis. Keep follow-up in the anticoagulation clinic.  3. End-stage renal disease on hemodialysis. He has had low blood pressures with dialysis sessions by report. Rather than cutting back his standing heart rate control regimen, may need to consider ProAmatine.  4. Essential hypertension, blood pressure is well controlled today.  Current medicines were reviewed with the patient today.   Orders Placed This Encounter  Procedures  . EKG 12-Lead    Disposition: Follow-up in one year,  sooner if needed.  Signed, [name], MD, [facility] 12/08/2016 2:31 PM    [place] [facility] [address] [place], [place] [address] Phone: [phone]; Fax: [phone]

## 2016-12-08 ENCOUNTER — Encounter: Payer: Self-pay | Admitting: Cardiology

## 2016-12-08 ENCOUNTER — Ambulatory Visit (INDEPENDENT_AMBULATORY_CARE_PROVIDER_SITE_OTHER): Payer: Medicare HMO | Admitting: Cardiology

## 2016-12-08 VITALS — BP 106/68 | HR 91 | Ht 74.0 in | Wt 236.0 lb

## 2016-12-08 DIAGNOSIS — I10 Essential (primary) hypertension: Secondary | ICD-10-CM | POA: Diagnosis not present

## 2016-12-08 DIAGNOSIS — N186 End stage renal disease: Secondary | ICD-10-CM

## 2016-12-08 DIAGNOSIS — I482 Chronic atrial fibrillation, unspecified: Secondary | ICD-10-CM

## 2016-12-08 DIAGNOSIS — Z8679 Personal history of other diseases of the circulatory system: Secondary | ICD-10-CM

## 2016-12-08 NOTE — Patient Instructions (Signed)

## 2016-12-09 DIAGNOSIS — Z992 Dependence on renal dialysis: Secondary | ICD-10-CM | POA: Diagnosis not present

## 2016-12-09 DIAGNOSIS — N186 End stage renal disease: Secondary | ICD-10-CM | POA: Diagnosis not present

## 2016-12-11 DIAGNOSIS — N186 End stage renal disease: Secondary | ICD-10-CM | POA: Diagnosis not present

## 2016-12-11 DIAGNOSIS — Z992 Dependence on renal dialysis: Secondary | ICD-10-CM | POA: Diagnosis not present

## 2016-12-14 DIAGNOSIS — N186 End stage renal disease: Secondary | ICD-10-CM | POA: Diagnosis not present

## 2016-12-14 DIAGNOSIS — Z992 Dependence on renal dialysis: Secondary | ICD-10-CM | POA: Diagnosis not present

## 2016-12-15 ENCOUNTER — Ambulatory Visit (INDEPENDENT_AMBULATORY_CARE_PROVIDER_SITE_OTHER): Payer: Medicare HMO | Admitting: *Deleted

## 2016-12-15 DIAGNOSIS — I4891 Unspecified atrial fibrillation: Secondary | ICD-10-CM

## 2016-12-15 DIAGNOSIS — Z5181 Encounter for therapeutic drug level monitoring: Secondary | ICD-10-CM

## 2016-12-15 LAB — POCT INR: INR: 2.6

## 2016-12-16 DIAGNOSIS — N186 End stage renal disease: Secondary | ICD-10-CM | POA: Diagnosis not present

## 2016-12-16 DIAGNOSIS — Z992 Dependence on renal dialysis: Secondary | ICD-10-CM | POA: Diagnosis not present

## 2016-12-18 DIAGNOSIS — N186 End stage renal disease: Secondary | ICD-10-CM | POA: Diagnosis not present

## 2016-12-18 DIAGNOSIS — Z992 Dependence on renal dialysis: Secondary | ICD-10-CM | POA: Diagnosis not present

## 2016-12-21 DIAGNOSIS — N186 End stage renal disease: Secondary | ICD-10-CM | POA: Diagnosis not present

## 2016-12-21 DIAGNOSIS — Z992 Dependence on renal dialysis: Secondary | ICD-10-CM | POA: Diagnosis not present

## 2016-12-22 DIAGNOSIS — N186 End stage renal disease: Secondary | ICD-10-CM | POA: Diagnosis not present

## 2016-12-22 DIAGNOSIS — Z992 Dependence on renal dialysis: Secondary | ICD-10-CM | POA: Diagnosis not present

## 2016-12-23 DIAGNOSIS — N186 End stage renal disease: Secondary | ICD-10-CM | POA: Diagnosis not present

## 2016-12-23 DIAGNOSIS — Z992 Dependence on renal dialysis: Secondary | ICD-10-CM | POA: Diagnosis not present

## 2016-12-24 DIAGNOSIS — N186 End stage renal disease: Secondary | ICD-10-CM | POA: Diagnosis not present

## 2016-12-24 DIAGNOSIS — Z992 Dependence on renal dialysis: Secondary | ICD-10-CM | POA: Diagnosis not present

## 2016-12-25 DIAGNOSIS — Z992 Dependence on renal dialysis: Secondary | ICD-10-CM | POA: Diagnosis not present

## 2016-12-25 DIAGNOSIS — N186 End stage renal disease: Secondary | ICD-10-CM | POA: Diagnosis not present

## 2016-12-30 DIAGNOSIS — N186 End stage renal disease: Secondary | ICD-10-CM | POA: Diagnosis not present

## 2016-12-30 DIAGNOSIS — Z992 Dependence on renal dialysis: Secondary | ICD-10-CM | POA: Diagnosis not present

## 2017-01-01 DIAGNOSIS — N186 End stage renal disease: Secondary | ICD-10-CM | POA: Diagnosis not present

## 2017-01-01 DIAGNOSIS — Z992 Dependence on renal dialysis: Secondary | ICD-10-CM | POA: Diagnosis not present

## 2017-01-03 ENCOUNTER — Encounter (HOSPITAL_COMMUNITY): Payer: Self-pay | Admitting: Cardiology

## 2017-01-03 ENCOUNTER — Inpatient Hospital Stay (HOSPITAL_COMMUNITY)
Admission: EM | Admit: 2017-01-03 | Discharge: 2017-01-06 | DRG: 377 | Disposition: A | Discharge status: Home / Self Care | Payer: Medicare HMO | Attending: Internal Medicine | Admitting: Internal Medicine

## 2017-01-03 DIAGNOSIS — D649 Anemia, unspecified: Secondary | ICD-10-CM

## 2017-01-03 DIAGNOSIS — Z87891 Personal history of nicotine dependence: Secondary | ICD-10-CM

## 2017-01-03 DIAGNOSIS — Z8249 Family history of ischemic heart disease and other diseases of the circulatory system: Secondary | ICD-10-CM

## 2017-01-03 DIAGNOSIS — I4891 Unspecified atrial fibrillation: Secondary | ICD-10-CM | POA: Diagnosis present

## 2017-01-03 DIAGNOSIS — I10 Essential (primary) hypertension: Secondary | ICD-10-CM | POA: Diagnosis not present

## 2017-01-03 DIAGNOSIS — D631 Anemia in chronic kidney disease: Secondary | ICD-10-CM | POA: Diagnosis not present

## 2017-01-03 DIAGNOSIS — K5900 Constipation, unspecified: Secondary | ICD-10-CM | POA: Diagnosis not present

## 2017-01-03 DIAGNOSIS — E1122 Type 2 diabetes mellitus with diabetic chronic kidney disease: Secondary | ICD-10-CM | POA: Diagnosis present

## 2017-01-03 DIAGNOSIS — K2970 Gastritis, unspecified, without bleeding: Secondary | ICD-10-CM | POA: Diagnosis not present

## 2017-01-03 DIAGNOSIS — Z85528 Personal history of other malignant neoplasm of kidney: Secondary | ICD-10-CM

## 2017-01-03 DIAGNOSIS — K317 Polyp of stomach and duodenum: Secondary | ICD-10-CM | POA: Diagnosis not present

## 2017-01-03 DIAGNOSIS — K2990 Gastroduodenitis, unspecified, without bleeding: Secondary | ICD-10-CM

## 2017-01-03 DIAGNOSIS — R195 Other fecal abnormalities: Secondary | ICD-10-CM | POA: Diagnosis not present

## 2017-01-03 DIAGNOSIS — R198 Other specified symptoms and signs involving the digestive system and abdomen: Secondary | ICD-10-CM

## 2017-01-03 DIAGNOSIS — K2991 Gastroduodenitis, unspecified, with bleeding: Secondary | ICD-10-CM | POA: Diagnosis not present

## 2017-01-03 DIAGNOSIS — N2581 Secondary hyperparathyroidism of renal origin: Secondary | ICD-10-CM | POA: Diagnosis not present

## 2017-01-03 DIAGNOSIS — Z992 Dependence on renal dialysis: Secondary | ICD-10-CM

## 2017-01-03 DIAGNOSIS — R0602 Shortness of breath: Secondary | ICD-10-CM | POA: Diagnosis not present

## 2017-01-03 DIAGNOSIS — K921 Melena: Secondary | ICD-10-CM | POA: Diagnosis not present

## 2017-01-03 DIAGNOSIS — G4733 Obstructive sleep apnea (adult) (pediatric): Secondary | ICD-10-CM | POA: Diagnosis not present

## 2017-01-03 DIAGNOSIS — K2971 Gastritis, unspecified, with bleeding: Principal | ICD-10-CM | POA: Diagnosis present

## 2017-01-03 DIAGNOSIS — I429 Cardiomyopathy, unspecified: Secondary | ICD-10-CM | POA: Diagnosis present

## 2017-01-03 DIAGNOSIS — R791 Abnormal coagulation profile: Secondary | ICD-10-CM | POA: Diagnosis not present

## 2017-01-03 DIAGNOSIS — E875 Hyperkalemia: Secondary | ICD-10-CM | POA: Diagnosis present

## 2017-01-03 DIAGNOSIS — E782 Mixed hyperlipidemia: Secondary | ICD-10-CM | POA: Diagnosis present

## 2017-01-03 DIAGNOSIS — N186 End stage renal disease: Secondary | ICD-10-CM | POA: Diagnosis not present

## 2017-01-03 DIAGNOSIS — Z7901 Long term (current) use of anticoagulants: Secondary | ICD-10-CM | POA: Diagnosis not present

## 2017-01-03 DIAGNOSIS — R52 Pain, unspecified: Secondary | ICD-10-CM

## 2017-01-03 DIAGNOSIS — Z905 Acquired absence of kidney: Secondary | ICD-10-CM

## 2017-01-03 DIAGNOSIS — E1129 Type 2 diabetes mellitus with other diabetic kidney complication: Secondary | ICD-10-CM | POA: Diagnosis not present

## 2017-01-03 DIAGNOSIS — I120 Hypertensive chronic kidney disease with stage 5 chronic kidney disease or end stage renal disease: Secondary | ICD-10-CM | POA: Diagnosis present

## 2017-01-03 DIAGNOSIS — Z6829 Body mass index (BMI) 29.0-29.9, adult: Secondary | ICD-10-CM | POA: Diagnosis not present

## 2017-01-03 DIAGNOSIS — I482 Chronic atrial fibrillation: Secondary | ICD-10-CM | POA: Diagnosis present

## 2017-01-03 DIAGNOSIS — R9389 Abnormal findings on diagnostic imaging of other specified body structures: Secondary | ICD-10-CM | POA: Diagnosis not present

## 2017-01-03 DIAGNOSIS — D689 Coagulation defect, unspecified: Secondary | ICD-10-CM | POA: Diagnosis present

## 2017-01-03 DIAGNOSIS — R1110 Vomiting, unspecified: Secondary | ICD-10-CM | POA: Diagnosis not present

## 2017-01-03 DIAGNOSIS — K922 Gastrointestinal hemorrhage, unspecified: Secondary | ICD-10-CM | POA: Diagnosis not present

## 2017-01-03 DIAGNOSIS — K3189 Other diseases of stomach and duodenum: Secondary | ICD-10-CM | POA: Diagnosis not present

## 2017-01-03 DIAGNOSIS — E663 Overweight: Secondary | ICD-10-CM | POA: Diagnosis not present

## 2017-01-03 DIAGNOSIS — E871 Hypo-osmolality and hyponatremia: Secondary | ICD-10-CM | POA: Diagnosis present

## 2017-01-03 DIAGNOSIS — R0600 Dyspnea, unspecified: Secondary | ICD-10-CM

## 2017-01-03 DIAGNOSIS — E872 Acidosis: Secondary | ICD-10-CM | POA: Diagnosis not present

## 2017-01-03 LAB — CBC
HCT: 34.1 % — ABNORMAL LOW (ref 39.0–52.0)
Hemoglobin: 11.4 g/dL — ABNORMAL LOW (ref 13.0–17.0)
MCH: 31.7 pg (ref 26.0–34.0)
MCHC: 33.4 g/dL (ref 30.0–36.0)
MCV: 94.7 fL (ref 78.0–100.0)
PLATELETS: 183 10*3/uL (ref 150–400)
RBC: 3.6 MIL/uL — ABNORMAL LOW (ref 4.22–5.81)
RDW: 14.7 % (ref 11.5–15.5)
WBC: 6.3 10*3/uL (ref 4.0–10.5)

## 2017-01-03 LAB — COMPREHENSIVE METABOLIC PANEL
ALT: 48 U/L (ref 17–63)
AST: 42 U/L — AB (ref 15–41)
Albumin: 2.7 g/dL — ABNORMAL LOW (ref 3.5–5.0)
Alkaline Phosphatase: 40 U/L (ref 38–126)
Anion gap: 29 — ABNORMAL HIGH (ref 5–15)
BUN: 95 mg/dL — AB (ref 6–20)
CALCIUM: 7.8 mg/dL — AB (ref 8.9–10.3)
CHLORIDE: 95 mmol/L — AB (ref 101–111)
CO2: 11 mmol/L — AB (ref 22–32)
CREATININE: 13.08 mg/dL — AB (ref 0.61–1.24)
GFR calc Af Amer: 4 mL/min — ABNORMAL LOW (ref >60)
GFR calc non Af Amer: 4 mL/min — ABNORMAL LOW (ref >60)
GLUCOSE: 128 mg/dL — AB (ref 65–99)
Potassium: 5.1 mmol/L (ref 3.5–5.1)
SODIUM: 135 mmol/L (ref 135–145)
Total Bilirubin: 1.1 mg/dL (ref 0.3–1.2)
Total Protein: 6.9 g/dL (ref 6.5–8.1)

## 2017-01-03 LAB — TYPE AND SCREEN
ABO/RH(D): O POS
Antibody Screen: NEGATIVE

## 2017-01-03 LAB — POC OCCULT BLOOD, ED: FECAL OCCULT BLD: POSITIVE — AB

## 2017-01-03 LAB — PROTIME-INR
INR: >10
Prothrombin Time: >90 seconds — ABNORMAL HIGH (ref 11.4–15.2)

## 2017-01-03 MED ORDER — SEVELAMER CARBONATE 800 MG PO TABS: 1600.0000 mg | ORAL_TABLET | Freq: Three times a day (TID) | ORAL | Status: DC | PRN

## 2017-01-03 MED ORDER — PANTOPRAZOLE SODIUM 40 MG IV SOLR
INTRAVENOUS | Status: AC
  Filled 2017-01-03: qty 160

## 2017-01-03 MED ORDER — ACETAMINOPHEN 325 MG PO TABS: 650.0000 mg | ORAL_TABLET | Freq: Four times a day (QID) | ORAL | Status: DC | PRN

## 2017-01-03 MED ORDER — ONDANSETRON HCL 4 MG PO TABS: 4.0000 mg | ORAL_TABLET | Freq: Three times a day (TID) | ORAL | Status: DC | PRN

## 2017-01-03 MED ORDER — TETRAHYDROZOLINE HCL 0.05 % OP SOLN
2.0000 [drp] | Freq: Every day | OPHTHALMIC | Status: DC | PRN
  Filled 2017-01-03: qty 15

## 2017-01-03 MED ORDER — SODIUM CHLORIDE 0.9% FLUSH
3.0000 mL | Freq: Two times a day (BID) | INTRAVENOUS | Status: DC
  Administered 2017-01-04 – 2017-01-06 (×5): 3 mL via INTRAVENOUS

## 2017-01-03 MED ORDER — ZOLPIDEM TARTRATE 5 MG PO TABS
5.0000 mg | ORAL_TABLET | Freq: Every evening | ORAL | Status: DC | PRN
  Administered 2017-01-03: 5 mg via ORAL
  Filled 2017-01-03: qty 1

## 2017-01-03 MED ORDER — LINAGLIPTIN 5 MG PO TABS: 5.0000 mg | ORAL_TABLET | Freq: Every day | ORAL | Status: DC

## 2017-01-03 MED ORDER — PRAVASTATIN SODIUM 10 MG PO TABS
5.0000 mg | ORAL_TABLET | Freq: Every evening | ORAL | Status: DC
  Administered 2017-01-03 – 2017-01-06 (×4): 5 mg via ORAL
  Filled 2017-01-03 (×4): qty 1

## 2017-01-03 MED ORDER — SODIUM CHLORIDE 0.9 % IV SOLN: 250.0000 mL | INTRAVENOUS | Status: DC | PRN

## 2017-01-03 MED ORDER — SODIUM CHLORIDE 0.9% FLUSH: 3.0000 mL | INTRAVENOUS | Status: DC | PRN

## 2017-01-03 MED ORDER — PHYTONADIONE 5 MG PO TABS
5.0000 mg | ORAL_TABLET | Freq: Once | ORAL | Status: AC
  Administered 2017-01-03: 5 mg via ORAL
  Filled 2017-01-03: qty 1

## 2017-01-03 MED ORDER — ALLOPURINOL 100 MG PO TABS
100.0000 mg | ORAL_TABLET | Freq: Every day | ORAL | Status: DC
  Administered 2017-01-04 – 2017-01-06 (×3): 100 mg via ORAL
  Filled 2017-01-03 (×3): qty 1

## 2017-01-03 MED ORDER — ACETAMINOPHEN 650 MG RE SUPP: 650.0000 mg | Freq: Four times a day (QID) | RECTAL | Status: DC | PRN

## 2017-01-03 MED ORDER — SODIUM CHLORIDE 0.9 % IV SOLN
80.0000 mg | Freq: Once | INTRAVENOUS | Status: AC
  Administered 2017-01-03: 23:00:00 80 mg via INTRAVENOUS
  Filled 2017-01-03: qty 80

## 2017-01-03 MED ORDER — SODIUM CHLORIDE 0.9 % IV SOLN
8.0000 mg/h | INTRAVENOUS | Status: DC
  Administered 2017-01-03 – 2017-01-06 (×5): 8 mg/h via INTRAVENOUS
  Filled 2017-01-03 (×9): qty 80

## 2017-01-03 MED ORDER — SEVELAMER CARBONATE 800 MG PO TABS: 1600.0000 mg | ORAL_TABLET | ORAL | Status: DC

## 2017-01-03 MED ORDER — CINACALCET HCL 30 MG PO TABS
30.0000 mg | ORAL_TABLET | Freq: Every day | ORAL | Status: DC
  Administered 2017-01-03 – 2017-01-05 (×3): 30 mg via ORAL
  Filled 2017-01-03 (×4): qty 1

## 2017-01-03 MED ORDER — SEVELAMER CARBONATE 800 MG PO TABS
3200.0000 mg | ORAL_TABLET | Freq: Three times a day (TID) | ORAL | Status: DC
Start: 2017-01-04 — End: 2017-01-06
  Administered 2017-01-06 (×3): 3200 mg via ORAL
  Filled 2017-01-03 (×5): qty 4

## 2017-01-03 NOTE — ED Triage Notes (Signed)
Black stools times 1 week.  C/o weakness.  Seen Dr. Gerarda Fraction today and b/p was low.  Dr. Gerarda Fraction sent pt to the ER.

## 2017-01-03 NOTE — ED Notes (Signed)
Pt c/o weakness upon standing.

## 2017-01-03 NOTE — ED Notes (Addendum)
INR >10 Date and time results received: 01/03/17 7:26 PM   Test: INR/Prothrombin Time  Critical Value: >10  Name of Provider Notified: Lita Mains  Orders Received? Or Actions Taken?: no new orders at this time.

## 2017-01-03 NOTE — H&P (Addendum)
TRH H&P   Patient Demographics:    Albert [name], is a 61 y.o. male  MRN: [id number]   DOB - 28-Nov-1955  Admit Date - 01/03/2017  Outpatient Primary MD for the patient is Redmond School, MD  Referring MD/NP/PA: Julianne Rice  Outpatient Specialists:  Rozann Lesches (cardiology) Dr. Sydell Axon (GI) Dr. Hinda Lenis Patient coming from: home  Chief Complaint  Patient presents with  . GI Bleeding      HPI:    Albert [name]  is a 61 y.o. male, w Hypertension, dm2, ESRD on HD T, T, S , Pafib, , Anemia presentlys w c/o n/v, diarrhea, and black stool x1 week.  Pt denies fever, chills, cp, palp, cough, hematemesis, abd pain, constipation, brbpr.   Pt states that the n/v, diarrhea has improved, he is only having 2 loose bm per day.  Pt denies NSAID use.  Pt presented today due to black stool.   In ED,  Wbc 6.3, hgb 11.4, Plt 183 Bun 95, Creatinine 13.08, Ast 42, Alt 48,  Alb 2.7 (low) Na 135, K 5.1  INR >10.00 Fobt +  Pt will be admitted for possible UGI bleeding, and coagulopathy.       Review of systems:    In addition to the HPI above,  No Fever-chills, No Headache, No changes with Vision or hearing, No problems swallowing food or Liquids, No Chest pain, Cough or Shortness of Breath, No Abdominal pain No Blood in  Urine, No dysuria, No new skin rashes or bruises, No new joints pains-aches,  No new weakness, tingling, numbness in any extremity, No recent weight gain or loss, No polyuria, polydypsia or polyphagia, No significant Mental Stressors.  A full 10 point Review of Systems was done, except as stated above, all other Review of Systems were negative.   With Past History of the following :    Past Medical History:  Diagnosis Date  . Anemia   . Atrial fibrillation ([place])   . Cardiomyopathy    LVEF 50-55% 8/11 - [facility]  . Colonic polyp   .  Diverticulosis of colon   . ESRD on hemodialysis ([place])    Dr. Lowanda Foster [facility]  . Essential hypertension, benign   . Gout   . Mixed hyperlipidemia   . OSA (obstructive sleep apnea)    CPAP  . Renal cell cancer ([place])   . Type 2 diabetes mellitus ([place])    Type II      Past Surgical History:  Procedure Laterality Date  . COLONOSCOPY W/ POLYPECTOMY    . Left arm AV fistula  2009   Dr. [name]  . Right nephrectomy        Social History:     Social History   Tobacco Use  . Smoking status: Former Smoker    Types: Cigarettes    Last attempt to quit: 07/04/1998    Years since quitting: 18.5  .  Smokeless tobacco: Never Used  . Tobacco comment: Quit May 2000  Substance Use Topics  . Alcohol use: No    Alcohol/week: 0.0 oz    Comment: Quit 2014; Previously drank about 3 drinks a day     Lives - at home  Mobility - walks by self   Family History :     Family History  Problem Relation Age of Onset  . Hypertension Mother   . Hypertension Unknown        Siblings  . Diabetes type II Unknown        Siblings  . Colon cancer Neg Hx       Home Medications:   Prior to Admission medications   Medication Sig Start Date End Date Taking? Authorizing Provider  allopurinol (ZYLOPRIM) 100 MG tablet Take 100 mg by mouth daily.     [provider]  carvedilol (COREG) 25 MG tablet TAKE 1 TABLET TWICE DAILY WITH MEALS Patient taking differently: take 1/2 tablet two times daily 04/06/16   [name], MD  cinacalcet (SENSIPAR) 30 MG tablet Take 30 mg by mouth at bedtime.     [provider]  diltiazem (TIAZAC) 300 MG 24 hr capsule TAKE 1 CAPSULE EVERY DAY Patient taking differently: 180 mg 08/01/15   [name], MD  docusate sodium (COLACE) 100 MG capsule Take 100 mg by mouth daily.    [provider]  fenofibrate 160 MG tablet Take 160 mg by mouth daily.    [provider]  fish oil-omega-3 fatty acids 1000 MG capsule Take 1 g by  mouth 3 (three) times daily.     [provider]  linagliptin (TRADJENTA) 5 MG TABS tablet Take 5 mg by mouth daily.    [provider]  pravastatin (PRAVACHOL) 10 MG tablet Take 5 mg by mouth every evening.     [provider]  sevelamer carbonate (RENVELA) 800 MG tablet Take 800-1,600 mg by mouth as directed. Take 2 tablets after each meal and 1 after each snack  (8 tablets daily)     [provider]  Tetrahydrozoline HCl (VISINE OP) Place 2 drops into both eyes daily as needed (dry eyes).    [provider]  warfarin (COUMADIN) 2.5 MG tablet Take 1 tablet daily except 1 1/2 tablets on Sundays, Tuesdays and Thursdays or as directed 02/19/16   [name], MD     Allergies:    No Known Allergies   Physical Exam:   Vitals  Blood pressure 108/73, pulse 75, temperature (!) 97.4 F (36.3 C), temperature source Oral, resp. rate 20, height 6\' 2"  (1.88 m), weight 104.3 kg (230 lb), SpO2 100 %.   1. General  lying in bed in NAD,    2. Normal affect and insight, Not Suicidal or Homicidal, Awake Alert, Oriented X 3.  3. No F.N deficits, ALL C.Nerves Intact, Strength 5/5 all 4 extremities, Sensation intact all 4 extremities, Plantars down going.  4. Ears and Eyes appear Normal, Conjunctivae clear, PERRLA. Moist Oral Mucosa.  5. Supple Neck, No JVD, No cervical lymphadenopathy appriciated, No Carotid Bruits.  6. Symmetrical Chest wall movement, Good air movement bilaterally, CTAB.  7. RRR, No Gallops, Rubs or Murmurs, No Parasternal Heave.  8. Positive Bowel Sounds, Abdomen Soft, No tenderness, No organomegaly appriciated,No rebound -guarding or rigidity.  9.  No Cyanosis, Normal Skin Turgor, No Skin Rash or Bruise.  10. Good muscle tone,  joints appear normal , no effusions, Normal ROM.  11. No Palpable Lymph Nodes in Neck or Axillae  LAVF + bruit, + thrill   Data Review:    CBC Recent Labs  Lab 01/03/17 1706  WBC 6.3  HGB  11.4*  HCT 34.1*  PLT 183  MCV 94.7  MCH 31.7  MCHC 33.4  RDW 14.7   ------------------------------------------------------------------------------------------------------------------  Chemistries  Recent Labs  Lab 01/03/17 1706  NA 135  K 5.1  CL 95*  CO2 11*  GLUCOSE 128*  BUN 95*  CREATININE 13.08*  CALCIUM 7.8*  AST 42*  ALT 48  ALKPHOS 40  BILITOT 1.1   ------------------------------------------------------------------------------------------------------------------ estimated creatinine clearance is 7.5 mL/min (A) (by C-G formula based on SCr of 13.08 mg/dL (H)). ------------------------------------------------------------------------------------------------------------------ No results for input(s): TSH, T4TOTAL, T3FREE, THYROIDAB in the last 72 hours.  Invalid input(s): FREET3  Coagulation profile Recent Labs  Lab 01/03/17 1823  INR >10.00*   ------------------------------------------------------------------------------------------------------------------- No results for input(s): DDIMER in the last 72 hours. -------------------------------------------------------------------------------------------------------------------  Cardiac Enzymes No results for input(s): CKMB, TROPONINI, MYOGLOBIN in the last 168 hours.  Invalid input(s): CK ------------------------------------------------------------------------------------------------------------------ No results found for: BNP   ---------------------------------------------------------------------------------------------------------------  Urinalysis    Component Value Date/Time   COLORURINE YELLOW 10/12/2013 [place] 10/12/2013 1045   LABSPEC 1.015 10/12/2013 1045   PHURINE 8.0 10/12/2013 1045   GLUCOSEU NEGATIVE 10/12/2013 1045   HGBUR MODERATE (A) 10/12/2013 1045   BILIRUBINUR NEGATIVE 10/12/2013 1045   KETONESUR NEGATIVE 10/12/2013 1045   PROTEINUR 30 (A) 10/12/2013 1045    UROBILINOGEN 1.0 10/12/2013 1045   NITRITE NEGATIVE 10/12/2013 1045   LEUKOCYTESUR NEGATIVE 10/12/2013 1045    ----------------------------------------------------------------------------------------------------------------   Imaging Results:    No results found.   Assessment & Plan:    Principal Problem:   Coagulopathy ([place]) Active Problems:   Atrial fibrillation ([facility])   ESRD (end stage renal disease) ([facility])   Anemia   Coagulopathy Vitamin K 5mg  po x1 Check INR in am  Black stool  ? UGI bleeding NPO  protonix 80mg  iv x1, then 8mg /hr GI consult   N/v resolved Zofran pain  Diarrhea Check stool for GI pathogen panel Check stool for C. Diff  ESRD on HD (T, T, S) Cont renvela, cont sensipar Nephrology consult  Hypertension/ PAFib Cont carvedilol, cont cardizem STOP COUMADIN  Hyperlipidemia Cont pravastatin  Dm2 STOP METFORMIN due to creatinine and risk of lactic acidosis Cont tradjenta FSBS q4h, ISS      DVT Prophylaxis SCDs  AM Labs Ordered, also please review Full Orders  Family Communication: Admission, patients condition and plan of care including tests being ordered have been discussed with the patient  who indicate understanding and agree with the plan and Code Status.  Code Status FULL CODE  Likely DC to home  Condition GUARDED   Consults called: nephrology , GI by computer  Admission status: inpatient  Time spent in minutes : 45   [name] M.D on 01/03/2017 at 8:20 PM  Between 7am to 7pm - Pager - [phone]. After 7pm go to [email or web address] - password [facility]  [facility] - Office  [phone]

## 2017-01-03 NOTE — ED Provider Notes (Signed)
[facility] EMERGENCY DEPARTMENT Provider Note   CSN: [id number] Arrival date & time: 01/03/17  1648     History   Chief Complaint Chief Complaint  Patient presents with  . GI Bleeding    HPI Albert [name] is a 61 y.o. male.  HPI Patient presents with dark stool for the past week.  Has had increased generalized weakness and lightheadedness with standing.  Denies any fever or chills.  Denies chest pain or shortness of breath.  Denies NSAID use but is on Coumadin for atrial fibrillation. Past Medical History:  Diagnosis Date  . Anemia   . Atrial fibrillation ([place])   . Cardiomyopathy    LVEF 50-55% 8/11 - [facility]  . Colonic polyp   . Diverticulosis of colon   . ESRD on hemodialysis ([place])    Dr. Lowanda Foster [facility]  . Essential hypertension, benign   . Gout   . Mixed hyperlipidemia   . OSA (obstructive sleep apnea)    CPAP  . Renal cell cancer ([place])   . Type 2 diabetes mellitus ([place])    Type II    Patient Active Problem List   Diagnosis Date Noted  . Coagulopathy ([address]) 01/03/2017  . Constipation 12/17/2015  . Rectal bleeding 12/17/2015  . History of colonic polyps 12/17/2015  . Visit for suture removal 12/06/2013  . Bradycardia 10/12/2013  . Metabolic acidosis 25/06/3974  . Acute respiratory failure with hypercapnia ([place]) 10/12/2013  . Encounter for therapeutic drug monitoring 03/29/2013  . Atrial fibrillation ([place]) 03/10/2011  . Long term (current) use of anticoagulants 03/10/2011  . Secondary cardiomyopathy, unspecified 03/10/2011  . ESRD on dialysis ([place]) 03/10/2011  . HYPERLIPIDEMIA 08/24/2007  . PRIMARY CENTRAL SLEEP APNEA 08/24/2007  . OBSTRUCTIVE SLEEP APNEA 08/24/2007  . Essential hypertension, benign 08/24/2007    Past Surgical History:  Procedure Laterality Date  . COLONOSCOPY W/ POLYPECTOMY    . Left arm AV fistula  2009   Dr. [name]  . Right nephrectomy         Home Medications    Prior to Admission medications   Medication Sig  Start Date End Date Taking? Authorizing Provider  allopurinol (ZYLOPRIM) 100 MG tablet Take 100 mg by mouth daily.     [provider]  carvedilol (COREG) 25 MG tablet TAKE 1 TABLET TWICE DAILY WITH MEALS Patient taking differently: take 1/2 tablet two times daily 04/06/16   [name], MD  cinacalcet (SENSIPAR) 30 MG tablet Take 30 mg by mouth at bedtime.     [provider]  diltiazem (TIAZAC) 300 MG 24 hr capsule TAKE 1 CAPSULE EVERY DAY Patient taking differently: 180 mg 08/01/15   [name], MD  docusate sodium (COLACE) 100 MG capsule Take 100 mg by mouth daily.    [provider]  fenofibrate 160 MG tablet Take 160 mg by mouth daily.    [provider]  fish oil-omega-3 fatty acids 1000 MG capsule Take 1 g by mouth 3 (three) times daily.     [provider]  linagliptin (TRADJENTA) 5 MG TABS tablet Take 5 mg by mouth daily.    [provider]  pravastatin (PRAVACHOL) 10 MG tablet Take 5 mg by mouth every evening.     [provider]  sevelamer carbonate (RENVELA) 800 MG tablet Take 800-1,600 mg by mouth as directed. Take 2 tablets after each meal and 1 after each snack  (8 tablets daily)     [provider]  Tetrahydrozoline HCl (VISINE OP)  Place 2 drops into both eyes daily as needed (dry eyes).    [provider]  warfarin (COUMADIN) 2.5 MG tablet Take 1 tablet daily except 1 1/2 tablets on Sundays, Tuesdays and Thursdays or as directed 02/19/16   [name], MD    Family History Family History  Problem Relation Age of Onset  . Hypertension Mother   . Hypertension Unknown        Siblings  . Diabetes type II Unknown        Siblings  . Colon cancer Neg Hx     Social History Social History   Tobacco Use  . Smoking status: Former Smoker    Types: Cigarettes    Last attempt to quit: 07/04/1998    Years since quitting: 18.5  . Smokeless tobacco: Never Used  . Tobacco comment:  Quit May 2000  Substance Use Topics  . Alcohol use: No    Alcohol/week: 0.0 oz    Comment: Quit 2014; Previously drank about 3 drinks a day  . Drug use: No     Allergies   Patient has no known allergies.   Review of Systems Review of Systems  Constitutional: Positive for fatigue. Negative for chills and fever.  Respiratory: Negative for cough and shortness of breath.   Cardiovascular: Negative for chest pain, palpitations and leg swelling.  Gastrointestinal: Negative for abdominal pain, constipation, diarrhea, nausea and vomiting.  Genitourinary: Negative for dysuria, flank pain, frequency and hematuria.  Musculoskeletal: Negative for back pain, myalgias and neck pain.  Skin: Negative for rash and wound.  Neurological: Positive for weakness and light-headedness. Negative for syncope, numbness and headaches.  All other systems reviewed and are negative.    Physical Exam Updated Vital Signs BP 108/73 (BP Location: Right Arm)   Pulse 75   Temp (!) 97.4 F (36.3 C) (Oral)   Resp 20   Ht 6\' 2"  (1.88 m)   Wt 104.3 kg (230 lb)   SpO2 100%   BMI 29.53 kg/m   Physical Exam  Constitutional: He is oriented to person, place, and time. He appears well-developed and well-nourished. No distress.  HENT:  Head: Normocephalic and atraumatic.  Mouth/Throat: Oropharynx is clear and moist. No oropharyngeal exudate.  Eyes: EOM are normal. Pupils are equal, round, and reactive to light.  Neck: Normal range of motion. Neck supple.  Cardiovascular: Normal rate and regular rhythm.  Pulmonary/Chest: Effort normal and breath sounds normal. No stridor. No respiratory distress. He has no wheezes. He has no rales. He exhibits no tenderness.  Abdominal: Soft. Bowel sounds are normal. There is no tenderness. There is no rebound and no guarding.  Genitourinary:  Genitourinary Comments: Minimal stool in rectal vault.  Musculoskeletal: Normal range of motion. He exhibits no edema or tenderness.    Neurological: He is alert and oriented to person, place, and time.  Moves all extremities without focal deficit.  Sensation fully intact.  Skin: Skin is warm and dry. No rash noted. No erythema.  Psychiatric: He has a normal mood and affect. His behavior is normal.  Nursing note and vitals reviewed.    ED Treatments / Results  Labs (all labs ordered are listed, but only abnormal results are displayed) Labs Reviewed  COMPREHENSIVE METABOLIC PANEL - Abnormal; Notable for the following components:      Result Value   Chloride 95 (*)    CO2 11 (*)    Glucose, Bld 128 (*)    BUN 95 (*)    Creatinine, Ser  13.08 (*)    Calcium 7.8 (*)    Albumin 2.7 (*)    AST 42 (*)    GFR calc non Af Amer 4 (*)    GFR calc Af Amer 4 (*)    Anion gap 29 (*)    All other components within normal limits  CBC - Abnormal; Notable for the following components:   RBC 3.60 (*)    Hemoglobin 11.4 (*)    HCT 34.1 (*)    All other components within normal limits  PROTIME-INR - Abnormal; Notable for the following components:   Prothrombin Time >90.0 (*)    INR >10.00 (*)    All other components within normal limits  POC OCCULT BLOOD, ED - Abnormal; Notable for the following components:   Fecal Occult Bld POSITIVE (*)    All other components within normal limits  TYPE AND SCREEN    EKG  EKG Interpretation None       Radiology No results found.  Procedures Procedures (including critical care time)  Medications Ordered in ED Medications  phytonadione (VITAMIN K) tablet 5 mg (5 mg Oral Given 01/03/17 2011)     Initial Impression / Assessment and Plan / ED Course  I have reviewed the triage vital signs and the nursing notes.  Pertinent labs & imaging results that were available during my care of the patient were reviewed by me and considered in my medical decision making (see chart for details).    Discussed with hospitalist who will admit patient to ICU.  Vital signs remained  stable.  Final Clinical Impressions(s) / ED Diagnoses   Final diagnoses:  Gastrointestinal hemorrhage, unspecified gastrointestinal hemorrhage type  Supratherapeutic INR    ED Discharge Orders    None       Julianne Rice, MD 01/03/17 2017

## 2017-01-04 ENCOUNTER — Encounter (HOSPITAL_COMMUNITY): Payer: Self-pay | Admitting: Gastroenterology

## 2017-01-04 ENCOUNTER — Other Ambulatory Visit: Payer: Self-pay

## 2017-01-04 ENCOUNTER — Inpatient Hospital Stay (HOSPITAL_COMMUNITY): Payer: Medicare HMO

## 2017-01-04 DIAGNOSIS — K922 Gastrointestinal hemorrhage, unspecified: Secondary | ICD-10-CM

## 2017-01-04 DIAGNOSIS — R791 Abnormal coagulation profile: Secondary | ICD-10-CM

## 2017-01-04 DIAGNOSIS — D689 Coagulation defect, unspecified: Secondary | ICD-10-CM

## 2017-01-04 DIAGNOSIS — R195 Other fecal abnormalities: Secondary | ICD-10-CM

## 2017-01-04 LAB — HEMOGLOBIN AND HEMATOCRIT, BLOOD
HEMATOCRIT: 33.8 % — AB (ref 39.0–52.0)
HEMATOCRIT: 32.9 % — AB (ref 39.0–52.0)
HEMATOCRIT: 33.2 % — AB (ref 39.0–52.0)
HEMOGLOBIN: 11 g/dL — AB (ref 13.0–17.0)
Hemoglobin: 10.6 g/dL — ABNORMAL LOW (ref 13.0–17.0)
Hemoglobin: 10.9 g/dL — ABNORMAL LOW (ref 13.0–17.0)

## 2017-01-04 LAB — CBC
HCT: 35 % — ABNORMAL LOW (ref 39.0–52.0)
HEMOGLOBIN: 11.4 g/dL — AB (ref 13.0–17.0)
MCH: 31.4 pg (ref 26.0–34.0)
MCHC: 32.6 g/dL (ref 30.0–36.0)
MCV: 96.4 fL (ref 78.0–100.0)
Platelets: 203 10*3/uL (ref 150–400)
RBC: 3.63 MIL/uL — AB (ref 4.22–5.81)
RDW: 15 % (ref 11.5–15.5)
WBC: 8.1 10*3/uL (ref 4.0–10.5)

## 2017-01-04 LAB — GLUCOSE, CAPILLARY
GLUCOSE-CAPILLARY: 150 mg/dL — AB (ref 65–99)
GLUCOSE-CAPILLARY: 86 mg/dL (ref 65–99)
Glucose-Capillary: 121 mg/dL — ABNORMAL HIGH (ref 65–99)
Glucose-Capillary: 73 mg/dL (ref 65–99)

## 2017-01-04 LAB — COMPREHENSIVE METABOLIC PANEL
ALK PHOS: 39 U/L (ref 38–126)
ALT: 44 U/L (ref 17–63)
AST: 43 U/L — ABNORMAL HIGH (ref 15–41)
Albumin: 2.5 g/dL — ABNORMAL LOW (ref 3.5–5.0)
BUN: 99 mg/dL — ABNORMAL HIGH (ref 6–20)
CALCIUM: 7.7 mg/dL — AB (ref 8.9–10.3)
CO2: 7 mmol/L — ABNORMAL LOW (ref 22–32)
Chloride: 91 mmol/L — ABNORMAL LOW (ref 101–111)
Creatinine, Ser: 13.84 mg/dL — ABNORMAL HIGH (ref 0.61–1.24)
GFR, EST AFRICAN AMERICAN: 4 mL/min — AB (ref >60)
GFR, EST NON AFRICAN AMERICAN: 3 mL/min — AB (ref >60)
Glucose, Bld: 105 mg/dL — ABNORMAL HIGH (ref 65–99)
Potassium: 6.2 mmol/L — ABNORMAL HIGH (ref 3.5–5.1)
Sodium: 132 mmol/L — ABNORMAL LOW (ref 135–145)
TOTAL PROTEIN: 6.5 g/dL (ref 6.5–8.1)
Total Bilirubin: 1.1 mg/dL (ref 0.3–1.2)

## 2017-01-04 LAB — PROTIME-INR

## 2017-01-04 LAB — MRSA PCR SCREENING: MRSA by PCR: NEGATIVE

## 2017-01-04 MED ORDER — DILTIAZEM HCL 60 MG PO TABS
60.0000 mg | ORAL_TABLET | Freq: Four times a day (QID) | ORAL | Status: DC
  Administered 2017-01-04 – 2017-01-06 (×6): 60 mg via ORAL
  Filled 2017-01-04 (×7): qty 1

## 2017-01-04 MED ORDER — ALTEPLASE 2 MG IJ SOLR
2.0000 mg | Freq: Once | INTRAMUSCULAR | Status: DC | PRN
  Filled 2017-01-04: qty 2

## 2017-01-04 MED ORDER — NEPRO/CARBSTEADY PO LIQD
237.0000 mL | Freq: Two times a day (BID) | ORAL | Status: DC
  Administered 2017-01-04: 237 mL via ORAL

## 2017-01-04 MED ORDER — DEXTROSE 5 % IV SOLN
10.0000 mg | Freq: Once | INTRAVENOUS | Status: AC
  Administered 2017-01-04: 10 mg via INTRAVENOUS
  Filled 2017-01-04: qty 1

## 2017-01-04 MED ORDER — SODIUM CHLORIDE 0.9 % IV SOLN
Freq: Once | INTRAVENOUS | Status: AC
Start: 2017-01-04 — End: 2017-01-04
  Administered 2017-01-04: 11:00:00 via INTRAVENOUS

## 2017-01-04 MED ORDER — PENTAFLUOROPROP-TETRAFLUOROETH EX AERO
1.0000 "application " | INHALATION_SPRAY | CUTANEOUS | Status: DC | PRN
  Filled 2017-01-04: qty 30

## 2017-01-04 MED ORDER — SODIUM CHLORIDE 0.9 % IV SOLN
1.0000 g | Freq: Once | INTRAVENOUS | Status: AC
  Administered 2017-01-04: 1 g via INTRAVENOUS
  Filled 2017-01-04: qty 10

## 2017-01-04 MED ORDER — ALBUMIN HUMAN 25 % IV SOLN
25.0000 g | Freq: Once | INTRAVENOUS | Status: AC
Start: 2017-01-04 — End: 2017-01-04
  Administered 2017-01-04: 25 g via INTRAVENOUS
  Filled 2017-01-04: qty 100

## 2017-01-04 MED ORDER — SODIUM CHLORIDE 0.9 % IV SOLN: 100.0000 mL | INTRAVENOUS | Status: DC | PRN

## 2017-01-04 MED ORDER — LIDOCAINE-PRILOCAINE 2.5-2.5 % EX CREA
1.0000 "application " | TOPICAL_CREAM | CUTANEOUS | Status: DC | PRN
  Filled 2017-01-04: qty 5

## 2017-01-04 MED ORDER — DEXTROSE 50 % IV SOLN: 1.0000 | Freq: Once | INTRAVENOUS | Status: DC

## 2017-01-04 MED ORDER — LIDOCAINE HCL (PF) 1 % IJ SOLN
5.0000 mL | INTRAMUSCULAR | Status: DC | PRN
Start: 2017-01-04 — End: 2017-01-06

## 2017-01-04 MED ORDER — HEPARIN SODIUM (PORCINE) 1000 UNIT/ML DIALYSIS
1000.0000 [IU] | INTRAMUSCULAR | Status: DC | PRN
  Filled 2017-01-04: qty 1

## 2017-01-04 MED ORDER — INSULIN ASPART 100 UNIT/ML ~~LOC~~ SOLN
0.0000 [IU] | Freq: Three times a day (TID) | SUBCUTANEOUS | Status: DC
  Administered 2017-01-04 – 2017-01-06 (×3): 1 [IU] via SUBCUTANEOUS

## 2017-01-04 NOTE — Consult Note (Signed)
Reason for Consult: End-stage renal disease Referring Physician: Dr. Mateo Albert [name] is an 61 y.o. male.  HPI: He is a patient who has history of hypertension, diabetes, atrial fibrillation and end-stage renal disease presently came to the emergency room with complaints of weakness, some nausea, episode of vomiting and diarrhea with dark stool for the last couple of days.  Patient denies any frank bleeding or vomiting of blood.  The diarrhea has been on and off for some time.  He complains of feeling weak and tired.  He has poor p.o. intake.  He denies any difficulty in breathing.  Patient denies any previous history of GI bleeding.  Past Medical History:  Diagnosis Date  . Anemia   . Atrial fibrillation ([name])   . Cardiomyopathy    LVEF 50-55% 8/11 - [facility]  . Colonic polyp   . Diverticulosis of colon   . ESRD on hemodialysis (Albert [name])    Dr. Lowanda Foster [facility]  . Essential hypertension, benign   . Gout   . Mixed hyperlipidemia   . OSA (obstructive sleep apnea)    CPAP  . Renal cell cancer (Albert [name])   . Type 2 diabetes mellitus (Albert [name])    Type II    Past Surgical History:  Procedure Laterality Date  . COLONOSCOPY W/ POLYPECTOMY  2009, 2012   2009: Dr. [name]: 1.25 cm adenomatous polyp without high grade dysplasia, distal to IC valve. 2012: normal rectum, few pancolonic diverticula, single diminutive hyperplastic polyp  . ESOPHAGOGASTRODUODENOSCOPY  2009   Dr. [name]: accentuated undulating Z-lin with couple of islands of salmon-colored epithelium suspicious for Barrett's, s/p biopsy with path revealing mild inflammation, no metaplasia, dysplasia, or malignancy  . Left arm AV fistula  2009   Dr. [name]  . Right nephrectomy      Family History  Problem Relation Age of Onset  . Hypertension Mother   . Hypertension Unknown        Siblings  . Diabetes type II Unknown        Siblings  . Colon cancer Neg Hx     Social History:  reports that he quit smoking about 18 years ago. His  smoking use included cigarettes. he has never used smokeless tobacco. He reports that he does not drink alcohol or use drugs.  Allergies: No Known Allergies  Medications: I have reviewed the patient's current medications.  Results for orders placed or performed during the hospital encounter of 01/03/17 (from the past 48 hour(s))  Comprehensive metabolic panel     Status: Abnormal   Collection Time: 01/03/17  5:06 PM  Result Value Ref Range   Sodium 135 135 - 145 mmol/L   Potassium 5.1 3.5 - 5.1 mmol/L   Chloride 95 (L) 101 - 111 mmol/L   CO2 11 (L) 22 - 32 mmol/L   Glucose, Bld 128 (H) 65 - 99 mg/dL   BUN 95 (H) 6 - 20 mg/dL   Creatinine, Ser 13.08 (H) 0.61 - 1.24 mg/dL   Calcium 7.8 (L) 8.9 - 10.3 mg/dL   Total Protein 6.9 6.5 - 8.1 g/dL   Albumin 2.7 (L) 3.5 - 5.0 g/dL   AST 42 (H) 15 - 41 U/L   ALT 48 17 - 63 U/L   Alkaline Phosphatase 40 38 - 126 U/L   Total Bilirubin 1.1 0.3 - 1.2 mg/dL   GFR calc non Af Amer 4 (L) >60 mL/min   GFR calc Af Amer 4 (L) >60 mL/min    Comment: (NOTE) The  eGFR has been calculated using the CKD EPI equation. This calculation has not been validated in all clinical situations. eGFR's persistently <60 mL/min signify possible Chronic Kidney Disease.    Anion gap 29 (H) 5 - 15  CBC     Status: Abnormal   Collection Time: 01/03/17  5:06 PM  Result Value Ref Range   WBC 6.3 4.0 - 10.5 K/uL   RBC 3.60 (L) 4.22 - 5.81 MIL/uL   Hemoglobin 11.4 (L) 13.0 - 17.0 g/dL   HCT 34.1 (L) 39.0 - 52.0 %   MCV 94.7 78.0 - 100.0 fL   MCH 31.7 26.0 - 34.0 pg   MCHC 33.4 30.0 - 36.0 g/dL   RDW 14.7 11.5 - 15.5 %   Platelets 183 150 - 400 K/uL  Type and screen [name]     Status: None   Collection Time: 01/03/17  5:06 PM  Result Value Ref Range   ABO/RH(D) O POS    Antibody Screen NEG    Sample Expiration 01/06/2017   Protime-INR     Status: Abnormal   Collection Time: 01/03/17  6:23 PM  Result Value Ref Range   Prothrombin Time >90.0 (H) 11.4  - 15.2 seconds   INR >10.00 (HH)     Comment: CRITICAL RESULT CALLED TO, READ BACK BY AND VERIFIED WITH: LASHLEY,S AT 1930 ON 11.12.2018 BY ISLEY,B   POC occult blood, ED     Status: Abnormal   Collection Time: 01/03/17  6:29 PM  Result Value Ref Range   Fecal Occult Bld POSITIVE (A) NEGATIVE  Comprehensive metabolic panel     Status: Abnormal   Collection Time: 01/04/17  4:00 AM  Result Value Ref Range   Sodium 132 (L) 135 - 145 mmol/L   Potassium 6.2 (H) 3.5 - 5.1 mmol/L    Comment: DELTA CHECK NOTED   Chloride 91 (L) 101 - 111 mmol/L   CO2 7 (L) 22 - 32 mmol/L   Glucose, Bld 105 (H) 65 - 99 mg/dL   BUN 99 (H) 6 - 20 mg/dL   Creatinine, Ser 13.84 (H) 0.61 - 1.24 mg/dL   Calcium 7.7 (L) 8.9 - 10.3 mg/dL   Total Protein 6.5 6.5 - 8.1 g/dL   Albumin 2.5 (L) 3.5 - 5.0 g/dL   AST 43 (H) 15 - 41 U/L   ALT 44 17 - 63 U/L   Alkaline Phosphatase 39 38 - 126 U/L   Total Bilirubin 1.1 0.3 - 1.2 mg/dL   GFR calc non Af Amer 3 (L) >60 mL/min   GFR calc Af Amer 4 (L) >60 mL/min    Comment: (NOTE) The eGFR has been calculated using the CKD EPI equation. This calculation has not been validated in all clinical situations. eGFR's persistently <60 mL/min signify possible Chronic Kidney Disease.   CBC     Status: Abnormal   Collection Time: 01/04/17  4:00 AM  Result Value Ref Range   WBC 8.1 4.0 - 10.5 K/uL   RBC 3.63 (L) 4.22 - 5.81 MIL/uL   Hemoglobin 11.4 (L) 13.0 - 17.0 g/dL   HCT 35.0 (L) 39.0 - 52.0 %   MCV 96.4 78.0 - 100.0 fL   MCH 31.4 26.0 - 34.0 pg   MCHC 32.6 30.0 - 36.0 g/dL   RDW 15.0 11.5 - 15.5 %   Platelets 203 150 - 400 K/uL  Protime-INR     Status: Abnormal   Collection Time: 01/04/17  4:00 AM  Result Value Ref Range  Prothrombin Time >90.0 (H) 11.4 - 15.2 seconds   INR >10.00 (HH)     Comment: RESULT REPEATED AND VERIFIED CRITICAL RESULT CALLED TO, READ BACK BY AND VERIFIED WITH: HOWARD,C AT 5916 BY HUFFINES,S ON 01/04/17.   Glucose, capillary     Status:  Abnormal   Collection Time: 01/04/17  7:37 AM  Result Value Ref Range   Glucose-Capillary 121 (H) 65 - 99 mg/dL    No results found.  Review of Systems  Constitutional: Positive for malaise/fatigue. Negative for chills and fever.  Respiratory: Negative for shortness of breath.   Cardiovascular: Negative for chest pain, orthopnea and leg swelling.  Gastrointestinal: Positive for blood in stool, diarrhea, nausea and vomiting.  Neurological: Positive for weakness.   Blood pressure (!) 92/41, pulse 87, temperature 97.6 F (36.4 C), temperature source Axillary, resp. rate (!) 22, height _0  (1.88 m), weight 103.9 kg (229 lb 0.9 oz), SpO2 100 %. Physical Exam  Constitutional: He is oriented to person, place, and time. No distress.  Eyes: No scleral icterus.  Neck: No JVD present.  Cardiovascular: Normal rate and regular rhythm.  No murmur heard. Respiratory: He has no wheezes.  GI: He exhibits no distension. There is no tenderness. There is no rebound.  Musculoskeletal: He exhibits no edema.  Neurological: He is alert and oriented to person, place, and time.    Assessment/Plan: Problem #1 history of GI bleeding.  Patient with previous history of polyps/diverticulosis.  Presently he is heme positive.  His hemoglobin however is stable. 2] end-stage renal disease: He is status post hemodialysis on Friday.  He has some nausea and episode of vomiting.  His potassium is high at this morning. 3] hypertension: His blood pressure is somewhat low 4] diabetes 5] history of cardiomyopathy 6] history of atrial fibrillation: His heart rate is controlled 7] history of renal cancer Plan: We will make arrangement for patient to get dialysis today. 2] we will use 2K/2.5 calcium bath and dialyze him for 4 hours. 3] we will check his renal panel and CBC in the morning. 4] we will give him calcium gluconate, D50 and insulin for his hyperkalemia until dialysis is initiated.  Julen Rubert  S 01/04/2017, 8:24 AM

## 2017-01-04 NOTE — Consult Note (Signed)
Referring Provider: Dr. Maudie Mercury  Primary Care Physician:  Redmond School, MD Primary Gastroenterologist:  Dr. [name]   Date of Admission: 01/03/17 Date of Consultation: 01/04/17  Reason for Consultation:  Black stool   HPI:  Albert [name] is a 61 y.o. year old male known to this practice from prior evaluation for rectal bleeding, constipation. He underwent colonoscopy in Nov 2017 with Grade 1 internal hemorrhoids, otherwise normal. Needs surveillance in 5 years due to history of colon polyps. Remote EGD in 2009 suspicious for Barrett's but path revealing only mild inflammation, no metaplasia, dysplasia, or malignancy. On chronic Coumadin with history of afib. Presented to ED with dark stool for past week, increased weakness and light-headedness. Admitting Hgb 11.4, INR greater than 10. Heme positive in ED. Unknown baseline for Hgb although outpatient consultation note from 12/17/15 notes Hgb was 11.3 in 2016. Reviewing epic, appears Hgb in 10-11 range.   He tells me that a week ago he started having N/V/D. Stool was brown at that time. Denies sick contacts or recent antibiotics. States he then took kaopectate, and his stool turned dark. He stopped the kaopectate, and it turned brown again for awhile and then dark again even without kaopectate. Limited historian. Vague left-sided abdominal pain during this episode that has since improved. N/V improved. Diarrhea improved. No BM since admission. No evidence of overt GI bleeding at this time. Reports that he is anxious. Increased respiratory rate noted but denies shortness of breath or chest pain. States he is anxious about what is going on. He is not sure if he is taking a PPI at home or not. This is not on his med list either. He denies NSAIDs, aspirin powders. On dialysis. He has no chronic upper GI symptoms. Denies reflux, dysphagia.   Past Medical History:  Diagnosis Date  . Anemia   . Atrial fibrillation ([place])   . Cardiomyopathy    LVEF  50-55% 8/11 - [facility]  . Colonic polyp   . Diverticulosis of colon   . ESRD on hemodialysis ([place])    Dr. Lowanda Foster [facility]  . Essential hypertension, benign   . Gout   . Mixed hyperlipidemia   . OSA (obstructive sleep apnea)    CPAP  . Renal cell cancer ([place])   . Type 2 diabetes mellitus ([place])    Type II    Past Surgical History:  Procedure Laterality Date  . COLONOSCOPY W/ POLYPECTOMY  2009, 2012   2009: Dr. [name]: 1.25 cm adenomatous polyp without high grade dysplasia, distal to IC valve. 2012: normal rectum, few pancolonic diverticula, single diminutive hyperplastic polyp  . ESOPHAGOGASTRODUODENOSCOPY  2009   Dr. [name]: accentuated undulating Z-lin with couple of islands of salmon-colored epithelium suspicious for Barrett's, s/p biopsy with path revealing mild inflammation, no metaplasia, dysplasia, or malignancy  . Left arm AV fistula  2009   Dr. [name]  . Right nephrectomy    NOTE: Patient  Shaw incomplete in this note due to computer issues. See history tab for complete information. History tab information was reviewed with the patient and found to be correct.    Prior to Admission medications   Medication Sig Start Date End Date Taking? Authorizing Provider  allopurinol (ZYLOPRIM) 100 MG tablet Take 100 mg by mouth daily.    Yes [provider]  carvedilol (COREG) 25 MG tablet TAKE 1 TABLET TWICE DAILY WITH MEALS Patient taking differently: take 1/2 tablet two times daily 04/06/16  Yes [name], MD  cinacalcet [facility])  30 MG tablet Take 30 mg by mouth at bedtime.    Yes [provider]  docusate sodium (COLACE) 100 MG capsule Take 100 mg by mouth daily.   Yes [provider]  fenofibrate 160 MG tablet Take 160 mg every evening by mouth.    Yes [provider]  fish oil-omega-3 fatty acids 1000 MG capsule Take 1 g by mouth 3 (three) times daily.    Yes [provider]  linagliptin (TRADJENTA) 5 MG TABS tablet Take 5 mg by  mouth daily.   Yes [provider]  metFORMIN (GLUCOPHAGE-XR) 750 MG 24 hr tablet Take 750 mg daily by mouth. 12/13/16  Yes [provider]  pravastatin (PRAVACHOL) 10 MG tablet Take 5 mg by mouth every evening.    Yes [provider]  sevelamer carbonate (RENVELA) 800 MG tablet Take 1,600-3,200 mg See admin instructions by mouth. Take 4 tablets after each meal and 2 after each snack   Yes [provider]  Tetrahydrozoline HCl (VISINE OP) Place 2 drops into both eyes daily as needed (dry eyes).   Yes [provider]  warfarin (COUMADIN) 2.5 MG tablet Take 1 tablet daily except 1 1/2 tablets on Sundays, Tuesdays and Thursdays or as directed Patient taking differently: Take 2.35-3.75 mg See admin instructions by mouth. TAKE ONE TABLET ON MONDAYS AND FRIDAYS. TAKE ONE AND ONE-HALF TABLET ON ALL OTHER DAYS 02/19/16  Yes [name], MD  diltiazem [facility]) 300 MG 24 hr capsule TAKE 1 CAPSULE EVERY DAY Patient not taking: Reported on 01/03/2017 08/01/15   [name], MD    Current Facility-Administered Medications  Medication Dose Route Frequency Provider Last Rate Last Dose  . 0.9 %  sodium chloride infusion  250 mL Intravenous PRN [name], MD      . acetaminophen (TYLENOL) tablet 650 mg  650 mg Oral Q6H PRN [name], MD       Or  . acetaminophen (TYLENOL) suppository 650 mg  650 mg Rectal Q6H PRN [name], MD      . allopurinol (ZYLOPRIM) tablet 100 mg  100 mg Oral Daily [name], MD      . cinacalcet [facility]) tablet 30 mg  30 mg Oral Loma Sousa, MD   30 mg at 01/03/17 2250  . insulin aspart (novoLOG) injection 0-9 Units  0-9 Units Subcutaneous TID WC Tat, David, MD      . ondansetron [facility]) tablet 4 mg  4 mg Oral Q8H PRN Rise Patience, MD      . pantoprazole (PROTONIX) 80 mg in sodium chloride 0.9 % 250 mL (0.32 mg/mL) infusion  8 mg/hr Intravenous Continuous [name], MD 25 mL/hr at 01/03/17 2306 8 mg/hr at  01/03/17 2306  . pravastatin (PRAVACHOL) tablet 5 mg  5 mg Oral QPM [name], MD   5 mg at 01/03/17 2250  . sevelamer carbonate (RENVELA) tablet 3,200 mg  3,200 mg Oral TID WC [name], MD       And  . sevelamer carbonate (RENVELA) tablet 1,600 mg  1,600 mg Oral TID BM PRN [name], MD      . sodium chloride flush (NS) 0.9 % injection 3 mL  3 mL Intravenous Q12H [name], MD      . sodium chloride flush (NS) 0.9 % injection 3 mL  3 mL Intravenous PRN [name], MD      . tetrahydrozoline 0.05 % ophthalmic solution 2 drop  2 drop Both Eyes  Daily PRN [name], MD      . zolpidem [facility]) tablet 5 mg  5 mg Oral QHS PRN Rise Patience, MD   5 mg at 01/03/17 2332    Allergies as of 01/03/2017  . (No Known Allergies)    Family History  Problem Relation Age of Onset  . Hypertension Mother   . Hypertension Unknown        Siblings  . Diabetes type II Unknown        Siblings  . Colon cancer Neg Hx     Social History   Socioeconomic History  . Marital status: Single    Spouse name: Not on file  . Number of children: Not on file  . Years of education: Not on file  . Highest education level: Not on file  Social Needs  . Financial resource strain: Not on file  . Food insecurity - worry: Not on file  . Food insecurity - inability: Not on file  . Transportation needs - medical: Not on file  . Transportation needs - non-medical: Not on file  Occupational History  . Not on file  Tobacco Use  . Smoking status: Former Smoker    Types: Cigarettes    Last attempt to quit: 07/04/1998    Years since quitting: 18.5  . Smokeless tobacco: Never Used  . Tobacco comment: Quit May 2000  Substance and Sexual Activity  . Alcohol use: No    Alcohol/week: 0.0 oz    Comment: Quit 2014; Previously drank about 3 drinks a day  . Drug use: No  . Sexual activity: Not on file  Other Topics Concern  . Not on file  Social History Narrative  . Not on file    Review of Systems: Gen:  see HPI  CV: Denies chest pain, heart palpitations, syncope, edema  Resp: +DOE  GI: see HPI  MS: Denies joint pain,swelling, cramping Derm: Denies rash, itching, dry skin Psych: Denies depression, anxiety,confusion, or memory loss Heme: see HPI   Physical Exam: Vital signs in last 24 hours: Temp:  [97.4 F (36.3 C)-97.6 F (36.4 C)] 97.6 F (36.4 C) (11/13 0400) Pulse Rate:  [40-87] 87 (11/13 0600) Resp:  [16-34] 22 (11/13 0600) BP: (91-120)/(41-78) 92/41 (11/13 0600) SpO2:  [94 %-100 %] 100 % (11/13 0600) Weight:  [229 lb 0.9 oz (103.9 kg)-230 lb (104.3 kg)] 229 lb 0.9 oz (103.9 kg) (11/13 0500) Last BM Date: 01/03/17 General:   Alert,  Anxious but cooperative. Appears uncomfortable.  Head:  Normocephalic and atraumatic. Eyes:  Sclera clear, no icterus.  Ears:  Normal auditory acuity. Nose:  No deformity, discharge,  or lesions. Mouth:  No deformity or lesions Lungs:  Clear throughout to auscultation. Mild tachypnea, worsening while I was in room asking questions. No obvious labored work of breathing Heart:  S1 S2 present, irregularly irregular  Abdomen:  Soft, nontender and nondistended. No masses, hepatosplenomegaly. Hypoactive bowel sounds. Small umbilical hernia.  Rectal:  Deferred  Msk:  Symmetrical without gross deformities. Normal posture. Extremities:  Without  edema. Neurologic:  Alert and  oriented x4 Psych:  Alert and cooperative. Normal mood and affect.  Intake/Output from previous day: 11/12 0701 - 11/13 0700 In: 172.5 [I.V.:172.5] Out: -  Intake/Output this shift: No intake/output data recorded.  Lab Results: Recent Labs    01/03/17 1706 01/04/17 0400  WBC 6.3 8.1  HGB 11.4* 11.4*  HCT 34.1* 35.0*  PLT 183 203   BMET Recent Labs    01/03/17  1706 01/04/17 0400  NA 135 132*  K 5.1 6.2*  CL 95* 91*  CO2 11* 7*  GLUCOSE 128* 105*  BUN 95* 99*  CREATININE 13.08* 13.84*  CALCIUM 7.8* 7.7*   LFT Recent Labs    01/03/17 1706 01/04/17 0400   PROT 6.9 6.5  ALBUMIN 2.7* 2.5*  AST 42* 43*  ALT 48 44  ALKPHOS 40 39  BILITOT 1.1 1.1   PT/INR Recent Labs    01/03/17 1823 01/04/17 0400  LABPROT >90.0* >90.0*  INR >10.00* >10.00*    Impression: 61 year old male admitted with week-long history of N/V/D, reported melena, heme positive stool in ED, and coagulopathy with INR greater than 10. On chronic Coumadin. From his report, appears his stool was brown and then turned black in the setting of Kaopectate. Somewhat poor historian, and he believes he stopped kaopectate but the stool continued to be dark. No evidence of overt GI bleeding since admission. Nausea/vomiting improved, and diarrhea resolved as of admission. Remains on enteric precautions with stool studies ordered in case of further diarrhea.   In setting of coagulopathy, could have source from anywhere in GI tract. However, does not appear to have overt GI bleeding at this time and no melena since admitted. Agree with additional Vit K, holding Coumadin, and monitoring for further overt GI bleeding. Continue supportive measures, follow H/H. May need EGD once clinically stable, as last was in 2009. Colonoscopy is recently on file from Oct 2017 with internal hemorrhoids.   Mildly elevated AST: review of epic reveals intermittent mildly elevated transaminases. Continue to follow.   Plan: Follow H/H Coumadin on hold PPI BID Monitor for evidence of melena, overt GI bleeding Transfuse as needed Consideration for EGD once clinically stable and INR in appropriate range Follow LFTs Will continue to follow with you   Annitta Needs, PhD, ANP-BC [facility]      LOS: 1 day    01/04/2017, 8:07 AM

## 2017-01-04 NOTE — Progress Notes (Addendum)
PROGRESS NOTE  Albert [name]:[id number] DOB: 1955/08/11 DOA: 01/03/2017 PCP: Redmond School, MD  Brief History:  61 year old male with a history of ESRD (TTS), atrial fibrillation, hypertension, diabetes mellitus, and hyperlipidemia presenting with generalized weakness with associated nausea, vomiting, and diarrhea for 1 week. The patient has been complaining of hematochezia and melanotic stools for the past 6-7 days.  He denies any hematemesis.  He denies any fevers, chills, chest pain,  coughing, hemoptysis, abdominal pain.  The patient denies any new medications.  Fortunately, he states that his vomiting has gradually improved over the past 24 hours.  He has not had any emesis or hematochezia since admission to the hospital.  In the emergency department, the patient was afebrile and hemodynamically stable although his blood pressures have been soft with systolic blood pressures in the 90s.  WBC was 6.3 with hemoglobin 11.4.  INR was greater than 10.0.  FOBT was positive.  Nephrology and GI were consulted to assist with management.  Assessment/Plan: Coagulopathy -Presented with INR >10 -In setting of GI bleed--> give additional vitamin K 10 mg IV -Hold warfarin -Give 1 unit FFP  Melena/hematochezia -Consult GI -January 07, 2016 colonoscopy--internal hemorrhoids -Remote EGD in 2009 suspicious for Barrett's but path revealing only mild inflammation, no metaplasia, dysplasia -Based on hemoglobin ~11 -Type and screen -Trend hemoglobin q 6 hours -holding warfarin -continue protonix drip  Diarrhea -C. difficile toxin -Stool pathogen panel  Diabetes mellitus type 2 -Check hemoglobin A1c -NovoLog sliding scale -Holding Tradjenta  Chronic atrial fibrillation -Rate controlled -Holding warfarin secondary to GI bleed -Holding carvedilol and diltiazem secondary to soft blood pressure  Nonischemic cardiomyopathy -November 26, 2016 Echo EF 50-55%, no WMA, mild  TR -Holding carvedilol secondary to soft blood pressure -daily weights  Essential hypertension -Holding carvedilol and diltiazem secondary to soft blood pressure  Hyperkalemia -Patient to be dialyzed today -Management per nephrology  ESRD -hemodialysis on Tuesday, Thursday, Saturday  -Nephrology consultation  -metabolic bone disease and phosphate binders per nephrology  Hyperlipidemia -Restart Pravachol     Disposition Plan:   Home in 2-3 days  Family Communication:   Family at bedside  Consultants:  Renal, GI  Code Status:  FULL   DVT Prophylaxis:  SCDs   Procedures: As Listed in Progress Note Above  Antibiotics: None    Subjective: Patient complains of some shortness of breath.  He denies any headache, neck pain, chest pain, vomiting, diarrhea, abdominal pain presently.  He denies any fevers or chills.  Objective: Vitals:   01/04/17 0300 01/04/17 0400 01/04/17 0500 01/04/17 0600  BP: (!) 111/55 (!) 92/55 105/66 (!) 92/41  Pulse: 72 83 82 87  Resp: 20 19 20  (!) 22  Temp:  97.6 F (36.4 C)    TempSrc:  Axillary    SpO2: 99% 100% 100% 100%  Weight:   103.9 kg (229 lb 0.9 oz)   Height:        Intake/Output Summary (Last 24 hours) at 01/04/2017 3762 Last data filed at 01/04/2017 0600 Gross per 24 hour  Intake 172.5 ml  Output -  Net 172.5 ml   Weight change:  Exam:   General:  Pt is alert, follows commands appropriately, not in acute distress  HEENT: No icterus, No thrush, No neck mass, [place]/AT  Cardiovascular: IRRR, S1/S2, no rubs, no gallops  Respiratory: CTA bilaterally, no wheezing, no crackles, no rhonchi  Abdomen: Soft/+BS, non tender, non distended, no guarding  Extremities: No  edema, No lymphangitis, No petechiae, No rashes, no synovitis   Data Reviewed: I have personally reviewed following labs and imaging studies Basic Metabolic Panel: Recent Labs  Lab 01/03/17 1706 01/04/17 0400  NA 135 132*  K 5.1 6.2*  CL 95* 91*  CO2  11* 7*  GLUCOSE 128* 105*  BUN 95* 99*  CREATININE 13.08* 13.84*  CALCIUM 7.8* 7.7*   Liver Function Tests: Recent Labs  Lab 01/03/17 1706 01/04/17 0400  AST 42* 43*  ALT 48 44  ALKPHOS 40 39  BILITOT 1.1 1.1  PROT 6.9 6.5  ALBUMIN 2.7* 2.5*   No results for input(s): LIPASE, AMYLASE in the last 168 hours. No results for input(s): AMMONIA in the last 168 hours. Coagulation Profile: Recent Labs  Lab 01/03/17 1823 01/04/17 0400  INR >10.00* >10.00*   CBC: Recent Labs  Lab 01/03/17 1706 01/04/17 0400  WBC 6.3 8.1  HGB 11.4* 11.4*  HCT 34.1* 35.0*  MCV 94.7 96.4  PLT 183 203   Cardiac Enzymes: No results for input(s): CKTOTAL, CKMB, CKMBINDEX, TROPONINI in the last 168 hours. BNP: Invalid input(s): POCBNP CBG: Recent Labs  Lab 01/04/17 0737  GLUCAP 121*   HbA1C: No results for input(s): HGBA1C in the last 72 hours. Urine analysis:    Component Value Date/Time   COLORURINE YELLOW 10/12/2013 [place] 10/12/2013 1045   LABSPEC 1.015 10/12/2013 1045   PHURINE 8.0 10/12/2013 1045   GLUCOSEU NEGATIVE 10/12/2013 1045   HGBUR MODERATE (A) 10/12/2013 1045   BILIRUBINUR NEGATIVE 10/12/2013 1045   KETONESUR NEGATIVE 10/12/2013 1045   PROTEINUR 30 (A) 10/12/2013 1045   UROBILINOGEN 1.0 10/12/2013 1045   NITRITE NEGATIVE 10/12/2013 1045   LEUKOCYTESUR NEGATIVE 10/12/2013 1045   Sepsis Labs: @LABRCNTIP (procalcitonin:4,lacticidven:4) )No results found for this or any previous visit (from the past 240 hour(s)).   Scheduled Meds: . allopurinol  100 mg Oral Daily  . cinacalcet  30 mg Oral QHS  . insulin aspart  0-9 Units Subcutaneous TID WC  . pravastatin  5 mg Oral QPM  . sevelamer carbonate  3,200 mg Oral TID WC  . sodium chloride flush  3 mL Intravenous Q12H   Continuous Infusions: . sodium chloride    . pantoprozole (PROTONIX) infusion 8 mg/hr (01/03/17 2306)    Procedures/Studies: No results found.  Kaelei Wheeler, DO  [facility] Pager [phone]  If 7PM-7AM, please contact night-coverage [email or web address] Password [email or web address] 01/04/2017, 8:32 AM   LOS: 1 day

## 2017-01-04 NOTE — H&P (View-Only) (Signed)
Referring Provider: Dr. Maudie Mercury  Primary Care Physician:  Redmond School, MD Primary Gastroenterologist:  Dr. [name]   Date of Admission: 01/03/17 Date of Consultation: 01/04/17  Reason for Consultation:  Black stool   HPI:  Albert [name] is a 61 y.o. year old male known to this practice from prior evaluation for rectal bleeding, constipation. He underwent colonoscopy in Nov 2017 with Grade 1 internal hemorrhoids, otherwise normal. Needs surveillance in 5 years due to history of colon polyps. Remote EGD in 2009 suspicious for Barrett's but path revealing only mild inflammation, no metaplasia, dysplasia, or malignancy. On chronic Coumadin with history of afib. Presented to ED with dark stool for past week, increased weakness and light-headedness. Admitting Hgb 11.4, INR greater than 10. Heme positive in ED. Unknown baseline for Hgb although outpatient consultation note from 12/17/15 notes Hgb was 11.3 in 2016. Reviewing epic, appears Hgb in 10-11 range.   He tells me that a week ago he started having N/V/D. Stool was brown at that time. Denies sick contacts or recent antibiotics. States he then took kaopectate, and his stool turned dark. He stopped the kaopectate, and it turned brown again for awhile and then dark again even without kaopectate. Limited historian. Vague left-sided abdominal pain during this episode that has since improved. N/V improved. Diarrhea improved. No BM since admission. No evidence of overt GI bleeding at this time. Reports that he is anxious. Increased respiratory rate noted but denies shortness of breath or chest pain. States he is anxious about what is going on. He is not sure if he is taking a PPI at home or not. This is not on his med list either. He denies NSAIDs, aspirin powders. On dialysis. He has no chronic upper GI symptoms. Denies reflux, dysphagia.   Past Medical History:  Diagnosis Date  . Anemia   . Atrial fibrillation ([place])   . Cardiomyopathy    LVEF  50-55% 8/11 - [facility]  . Colonic polyp   . Diverticulosis of colon   . ESRD on hemodialysis ([place])    Dr. Lowanda Foster [facility]  . Essential hypertension, benign   . Gout   . Mixed hyperlipidemia   . OSA (obstructive sleep apnea)    CPAP  . Renal cell cancer ([place])   . Type 2 diabetes mellitus ([place])    Type II    Past Surgical History:  Procedure Laterality Date  . COLONOSCOPY W/ POLYPECTOMY  2009, 2012   2009: Dr. [name]: 1.25 cm adenomatous polyp without high grade dysplasia, distal to IC valve. 2012: normal rectum, few pancolonic diverticula, single diminutive hyperplastic polyp  . ESOPHAGOGASTRODUODENOSCOPY  2009   Dr. [name]: accentuated undulating Z-lin with couple of islands of salmon-colored epithelium suspicious for Barrett's, s/p biopsy with path revealing mild inflammation, no metaplasia, dysplasia, or malignancy  . Left arm AV fistula  2009   Dr. [name]  . Right nephrectomy    NOTE: Patient  [place] incomplete in this note due to computer issues. See history tab for complete information. History tab information was reviewed with the patient and found to be correct.    Prior to Admission medications   Medication Sig Start Date End Date Taking? Authorizing Provider  allopurinol (ZYLOPRIM) 100 MG tablet Take 100 mg by mouth daily.    Yes [provider]  carvedilol (COREG) 25 MG tablet TAKE 1 TABLET TWICE DAILY WITH MEALS Patient taking differently: take 1/2 tablet two times daily 04/06/16  Yes [name], MD  cinacalcet [facility])  30 MG tablet Take 30 mg by mouth at bedtime.    Yes [provider]  docusate sodium (COLACE) 100 MG capsule Take 100 mg by mouth daily.   Yes [provider]  fenofibrate 160 MG tablet Take 160 mg every evening by mouth.    Yes [provider]  fish oil-omega-3 fatty acids 1000 MG capsule Take 1 g by mouth 3 (three) times daily.    Yes [provider]  linagliptin (TRADJENTA) 5 MG TABS tablet Take 5 mg by  mouth daily.   Yes [provider]  metFORMIN (GLUCOPHAGE-XR) 750 MG 24 hr tablet Take 750 mg daily by mouth. 12/13/16  Yes [provider]  pravastatin (PRAVACHOL) 10 MG tablet Take 5 mg by mouth every evening.    Yes [provider]  sevelamer carbonate (RENVELA) 800 MG tablet Take 1,600-3,200 mg See admin instructions by mouth. Take 4 tablets after each meal and 2 after each snack   Yes [provider]  Tetrahydrozoline HCl (VISINE OP) Place 2 drops into both eyes daily as needed (dry eyes).   Yes [provider]  warfarin (COUMADIN) 2.5 MG tablet Take 1 tablet daily except 1 1/2 tablets on Sundays, Tuesdays and Thursdays or as directed Patient taking differently: Take 2.35-3.75 mg See admin instructions by mouth. TAKE ONE TABLET ON MONDAYS AND FRIDAYS. TAKE ONE AND ONE-HALF TABLET ON ALL OTHER DAYS 02/19/16  Yes [name], MD  diltiazem [facility]) 300 MG 24 hr capsule TAKE 1 CAPSULE EVERY DAY Patient not taking: Reported on 01/03/2017 08/01/15   [name], MD    Current Facility-Administered Medications  Medication Dose Route Frequency Provider Last Rate Last Dose  . 0.9 %  sodium chloride infusion  250 mL Intravenous PRN [name], MD      . acetaminophen (TYLENOL) tablet 650 mg  650 mg Oral Q6H PRN [name], MD       Or  . acetaminophen (TYLENOL) suppository 650 mg  650 mg Rectal Q6H PRN [name], MD      . allopurinol (ZYLOPRIM) tablet 100 mg  100 mg Oral Daily [name], MD      . cinacalcet [facility]) tablet 30 mg  30 mg Oral Loma Sousa, MD   30 mg at 01/03/17 2250  . insulin aspart (novoLOG) injection 0-9 Units  0-9 Units Subcutaneous TID WC Tat, David, MD      . ondansetron [facility]) tablet 4 mg  4 mg Oral Q8H PRN Rise Patience, MD      . pantoprazole (PROTONIX) 80 mg in sodium chloride 0.9 % 250 mL (0.32 mg/mL) infusion  8 mg/hr Intravenous Continuous [name], MD 25 mL/hr at 01/03/17 2306 8 mg/hr at  01/03/17 2306  . pravastatin (PRAVACHOL) tablet 5 mg  5 mg Oral QPM [name], MD   5 mg at 01/03/17 2250  . sevelamer carbonate (RENVELA) tablet 3,200 mg  3,200 mg Oral TID WC [name], MD       And  . sevelamer carbonate (RENVELA) tablet 1,600 mg  1,600 mg Oral TID BM PRN [name], MD      . sodium chloride flush (NS) 0.9 % injection 3 mL  3 mL Intravenous Q12H [name], MD      . sodium chloride flush (NS) 0.9 % injection 3 mL  3 mL Intravenous PRN [name], MD      . tetrahydrozoline 0.05 % ophthalmic solution 2 drop  2 drop Both Eyes  Daily PRN [name], MD      . zolpidem [facility]) tablet 5 mg  5 mg Oral QHS PRN Rise Patience, MD   5 mg at 01/03/17 2332    Allergies as of 01/03/2017  . (No Known Allergies)    Family History  Problem Relation Age of Onset  . Hypertension Mother   . Hypertension Unknown        Siblings  . Diabetes type II Unknown        Siblings  . Colon cancer Neg Hx     Social History   Socioeconomic History  . Marital status: Single    Spouse name: Not on file  . Number of children: Not on file  . Years of education: Not on file  . Highest education level: Not on file  Social Needs  . Financial resource strain: Not on file  . Food insecurity - worry: Not on file  . Food insecurity - inability: Not on file  . Transportation needs - medical: Not on file  . Transportation needs - non-medical: Not on file  Occupational History  . Not on file  Tobacco Use  . Smoking status: Former Smoker    Types: Cigarettes    Last attempt to quit: 07/04/1998    Years since quitting: 18.5  . Smokeless tobacco: Never Used  . Tobacco comment: Quit May 2000  Substance and Sexual Activity  . Alcohol use: No    Alcohol/week: 0.0 oz    Comment: Quit 2014; Previously drank about 3 drinks a day  . Drug use: No  . Sexual activity: Not on file  Other Topics Concern  . Not on file  Social History Narrative  . Not on file    Review of Systems: Gen:  see HPI  CV: Denies chest pain, heart palpitations, syncope, edema  Resp: +DOE  GI: see HPI  MS: Denies joint pain,swelling, cramping Derm: Denies rash, itching, dry skin Psych: Denies depression, anxiety,confusion, or memory loss Heme: see HPI   Physical Exam: Vital signs in last 24 hours: Temp:  [97.4 F (36.3 C)-97.6 F (36.4 C)] 97.6 F (36.4 C) (11/13 0400) Pulse Rate:  [40-87] 87 (11/13 0600) Resp:  [16-34] 22 (11/13 0600) BP: (91-120)/(41-78) 92/41 (11/13 0600) SpO2:  [94 %-100 %] 100 % (11/13 0600) Weight:  [229 lb 0.9 oz (103.9 kg)-230 lb (104.3 kg)] 229 lb 0.9 oz (103.9 kg) (11/13 0500) Last BM Date: 01/03/17 General:   Alert,  Anxious but cooperative. Appears uncomfortable.  Head:  Normocephalic and atraumatic. Eyes:  Sclera clear, no icterus.  Ears:  Normal auditory acuity. Nose:  No deformity, discharge,  or lesions. Mouth:  No deformity or lesions Lungs:  Clear throughout to auscultation. Mild tachypnea, worsening while I was in room asking questions. No obvious labored work of breathing Heart:  S1 S2 present, irregularly irregular  Abdomen:  Soft, nontender and nondistended. No masses, hepatosplenomegaly. Hypoactive bowel sounds. Small umbilical hernia.  Rectal:  Deferred  Msk:  Symmetrical without gross deformities. Normal posture. Extremities:  Without  edema. Neurologic:  Alert and  oriented x4 Psych:  Alert and cooperative. Normal mood and affect.  Intake/Output from previous day: 11/12 0701 - 11/13 0700 In: 172.5 [I.V.:172.5] Out: -  Intake/Output this shift: No intake/output data recorded.  Lab Results: Recent Labs    01/03/17 1706 01/04/17 0400  WBC 6.3 8.1  HGB 11.4* 11.4*  HCT 34.1* 35.0*  PLT 183 203   BMET Recent Labs    01/03/17  1706 01/04/17 0400  NA 135 132*  K 5.1 6.2*  CL 95* 91*  CO2 11* 7*  GLUCOSE 128* 105*  BUN 95* 99*  CREATININE 13.08* 13.84*  CALCIUM 7.8* 7.7*   LFT Recent Labs    01/03/17 1706 01/04/17 0400   PROT 6.9 6.5  ALBUMIN 2.7* 2.5*  AST 42* 43*  ALT 48 44  ALKPHOS 40 39  BILITOT 1.1 1.1   PT/INR Recent Labs    01/03/17 1823 01/04/17 0400  LABPROT >90.0* >90.0*  INR >10.00* >10.00*    Impression: 61 year old male admitted with week-long history of N/V/D, reported melena, heme positive stool in ED, and coagulopathy with INR greater than 10. On chronic Coumadin. From his report, appears his stool was brown and then turned black in the setting of Kaopectate. Somewhat poor historian, and he believes he stopped kaopectate but the stool continued to be dark. No evidence of overt GI bleeding since admission. Nausea/vomiting improved, and diarrhea resolved as of admission. Remains on enteric precautions with stool studies ordered in case of further diarrhea.   In setting of coagulopathy, could have source from anywhere in GI tract. However, does not appear to have overt GI bleeding at this time and no melena since admitted. Agree with additional Vit K, holding Coumadin, and monitoring for further overt GI bleeding. Continue supportive measures, follow H/H. May need EGD once clinically stable, as last was in 2009. Colonoscopy is recently on file from Oct 2017 with internal hemorrhoids.   Mildly elevated AST: review of epic reveals intermittent mildly elevated transaminases. Continue to follow.   Plan: Follow H/H Coumadin on hold PPI BID Monitor for evidence of melena, overt GI bleeding Transfuse as needed Consideration for EGD once clinically stable and INR in appropriate range Follow LFTs Will continue to follow with you   Annitta Needs, PhD, ANP-BC [facility]      LOS: 1 day    01/04/2017, 8:07 AM

## 2017-01-04 NOTE — Progress Notes (Signed)
Initial Nutrition Assessment  DOCUMENTATION CODES:  Not applicable  INTERVENTION:  Nepro Shake po BID, each supplement provides 425 kcal and 19 grams protein  Monitor PO intake. F/u as needed.   NUTRITION DIAGNOSIS:  Inadequate oral intake related to diarrhea, acute illness as evidenced by per patient report of only eating soup, broth and nepro for last week.   GOAL:  Patient will meet greater than or equal to 90% of their needs  MONITOR:  PO intake, Supplement acceptance, Diet advancement, Labs, Weight trends  REASON FOR ASSESSMENT:  Malnutrition Screening Tool    ASSESSMENT:  61 y/o male PMHx ESRD on HD (TTS), Afib, DM2, HLD/HTN, OSA, HF. Presented w/ c/o n/v. Diarrhea and black stools x 1 week. Work up in the ED revealed supratherapeutic INR (>10). Admitted for management.  .  On initial RD arrival, pt very lethargic, has labored breathing. He was largely unable to participate in interview. Communicated Last HD was Saturday and his dry wt is 103 kg (~226.5 lbs)  Returned later in afternoon during HD. Patient is much more alert. He says he feels exceptionally better now. RD asked about intake PTA  He says that for the last week, due to his stomach pains, he has only been eating soup/broth and other "sick day" foods. If he ate anything else, he would experience an episode of diarrhea. At baseline, He says he drinks 2x nepro per day. He does not take vitamins. He says he trys to follow the renal diet. He has been on dialysis x3 years.   Assuming patient correctly reported dry weight, patient has been gaining weight over the past year. Per chart, he was 215-220 at this time last year. Can get reweight after HD  At this time, his appetite is still poor. He was agreeable to Nepro while admitted. He denies any food requests/prefs. RD to monitor intake and can f/u as needed.   Physical Exam: Shows severe muscle wasting of gastrocnemius, moderate muscle wasting of quadriceps. Mild fat  wasting of orbital region. Has distended abdomen vs swelling.   Labs: K: 6.2, Bgs 100-150,  Albumin:2.5,   Meds: Sensipar/Renvela, IVF, PPI, Vit K, IV albumin,   Recent Labs  Lab 01/03/17 1706 01/04/17 0400  NA 135 132*  K 5.1 6.2*  CL 95* 91*  CO2 11* 7*  BUN 95* 99*  CREATININE 13.08* 13.84*  CALCIUM 7.8* 7.7*  GLUCOSE 128* 105*   NUTRITION - FOCUSED PHYSICAL EXAM:   Most Recent Value  Orbital Region  Mild depletion  Upper Arm Region  Mild depletion  Thoracic and Lumbar Region  No depletion  Buccal Region  No depletion  Temple Region  Mild depletion  Clavicle Bone Region  No depletion  Clavicle and Acromion Bone Region  No depletion  Scapular Bone Region  No depletion  Dorsal Hand  No depletion  Patellar Region  Mild depletion  Anterior Thigh Region  Moderate depletion  Posterior Calf Region  Severe depletion      Diet Order:  Diet full liquid Room service appropriate? Yes; Fluid consistency: Thin Diet NPO time specified Except for: Sips with Meds  EDUCATION NEEDS:  No education needs have been identified at this time  Skin:  Skin Assessment: Reviewed RN Assessment  Last BM:  11/12  Height:  Ht Readings from Last 1 Encounters:  01/03/17 6\' 2"  (1.88 m)   Weight:  Wt Readings from Last 1 Encounters:  01/04/17 229 lb 8 oz (104.1 kg)   Wt Readings from Last  10 Encounters:  01/04/17 229 lb 8 oz (104.1 kg)  12/08/16 236 lb (107 kg)  03/03/16 218 lb 9.6 oz (99.2 kg)  01/07/16 222 lb (100.7 kg)  12/17/15 219 lb 6.4 oz (99.5 kg)  08/04/15 217 lb (98.4 kg)  05/02/15 216 lb (98 kg)  03/31/15 215 lb (97.5 kg)  03/24/15 215 lb (97.5 kg)  03/19/15 216 lb (98 kg)   Ideal Body Weight:  86.36 kg  BMI:  Body mass index is 29.47 kg/m.  Standard body weight for ht/age/large frame: 89 KG Estimated Nutritional Needs:  Kcal:  2200-2400 kcals (25-27 kcal/kg sbw) Protein:  105-125g Pro (1.2-1.4 g/kg SBW) Fluid:  Per MD  Burtis Junes RD, LDN, CNSC Clinical  Nutrition Pager: [phone] 01/04/2017 4:10 PM

## 2017-01-04 NOTE — Progress Notes (Signed)
CRITICAL VALUE ALERT  Critical Value:  INR  >10  Date & Time Notied:   01-04-2017 5198

## 2017-01-04 NOTE — Progress Notes (Signed)
Patient hasn't had a BM since yesterday morning. Spoke with infection prevention & ok to d/c enteric precautions at this time.

## 2017-01-05 ENCOUNTER — Inpatient Hospital Stay (HOSPITAL_COMMUNITY): Payer: Medicare HMO

## 2017-01-05 ENCOUNTER — Encounter (HOSPITAL_COMMUNITY)
Admission: EM | Disposition: A | Discharge status: Home / Self Care | Payer: Self-pay | Source: Home / Self Care | Attending: Internal Medicine

## 2017-01-05 ENCOUNTER — Encounter (HOSPITAL_COMMUNITY): Payer: Self-pay | Admitting: *Deleted

## 2017-01-05 DIAGNOSIS — K3189 Other diseases of stomach and duodenum: Secondary | ICD-10-CM

## 2017-01-05 DIAGNOSIS — K922 Gastrointestinal hemorrhage, unspecified: Secondary | ICD-10-CM

## 2017-01-05 DIAGNOSIS — K2991 Gastroduodenitis, unspecified, with bleeding: Secondary | ICD-10-CM

## 2017-01-05 DIAGNOSIS — K921 Melena: Secondary | ICD-10-CM

## 2017-01-05 HISTORY — PX: ESOPHAGOGASTRODUODENOSCOPY: SHX5428

## 2017-01-05 HISTORY — PX: BIOPSY: SHX5522

## 2017-01-05 LAB — RENAL FUNCTION PANEL
ALBUMIN: 2.7 g/dL — AB (ref 3.5–5.0)
BUN: 48 mg/dL — ABNORMAL HIGH (ref 6–20)
CHLORIDE: 95 mmol/L — AB (ref 101–111)
CO2: 17 mmol/L — ABNORMAL LOW (ref 22–32)
CREATININE: 8 mg/dL — AB (ref 0.61–1.24)
Calcium: 7.6 mg/dL — ABNORMAL LOW (ref 8.9–10.3)
GFR calc Af Amer: 7 mL/min — ABNORMAL LOW (ref >60)
GFR, EST NON AFRICAN AMERICAN: 6 mL/min — AB (ref >60)
Glucose, Bld: 124 mg/dL — ABNORMAL HIGH (ref 65–99)
PHOSPHORUS: 5.9 mg/dL — AB (ref 2.5–4.6)
Potassium: 4.4 mmol/L (ref 3.5–5.1)
SODIUM: 134 mmol/L — AB (ref 135–145)

## 2017-01-05 LAB — HEMOGLOBIN AND HEMATOCRIT, BLOOD
HCT: 32.7 % — ABNORMAL LOW (ref 39.0–52.0)
HEMOGLOBIN: 10.9 g/dL — AB (ref 13.0–17.0)

## 2017-01-05 LAB — PREPARE FRESH FROZEN PLASMA: UNIT DIVISION: 0

## 2017-01-05 LAB — GLUCOSE, CAPILLARY
GLUCOSE-CAPILLARY: 128 mg/dL — AB (ref 65–99)
GLUCOSE-CAPILLARY: 104 mg/dL — AB (ref 65–99)
Glucose-Capillary: 97 mg/dL (ref 65–99)
Glucose-Capillary: 116 mg/dL — ABNORMAL HIGH (ref 65–99)

## 2017-01-05 LAB — BPAM FFP
BLOOD PRODUCT EXPIRATION DATE: 201811132359
ISSUE DATE / TIME: 201811131052
UNIT TYPE AND RH: 2800

## 2017-01-05 LAB — CBC
HCT: 33.1 % — ABNORMAL LOW (ref 39.0–52.0)
Hemoglobin: 10.9 g/dL — ABNORMAL LOW (ref 13.0–17.0)
MCH: 30.6 pg (ref 26.0–34.0)
MCHC: 32.9 g/dL (ref 30.0–36.0)
MCV: 93 fL (ref 78.0–100.0)
Platelets: 186 10*3/uL (ref 150–400)
RBC: 3.56 MIL/uL — AB (ref 4.22–5.81)
RDW: 14.6 % (ref 11.5–15.5)
WBC: 7.9 10*3/uL (ref 4.0–10.5)

## 2017-01-05 LAB — PROTIME-INR
INR: 1.95
PROTHROMBIN TIME: 22.1 s — AB (ref 11.4–15.2)

## 2017-01-05 LAB — HEPATITIS B SURFACE ANTIGEN: HEP B S AG: NEGATIVE

## 2017-01-05 LAB — HIV ANTIBODY (ROUTINE TESTING W REFLEX): HIV SCREEN 4TH GENERATION: NONREACTIVE

## 2017-01-05 SURGERY — EGD (ESOPHAGOGASTRODUODENOSCOPY)
Anesthesia: Moderate Sedation

## 2017-01-05 MED ORDER — SODIUM CHLORIDE BACTERIOSTATIC 0.9 % IJ SOLN
INTRAMUSCULAR | Status: AC
  Filled 2017-01-05: qty 10

## 2017-01-05 MED ORDER — MEPERIDINE HCL 100 MG/ML IJ SOLN
INTRAMUSCULAR | Status: DC | PRN
  Administered 2017-01-05: 25 mg via INTRAVENOUS

## 2017-01-05 MED ORDER — LIDOCAINE VISCOUS 2 % MT SOLN
OROMUCOSAL | Status: AC
Start: 2017-01-05 — End: 2017-01-06
  Filled 2017-01-05: qty 15

## 2017-01-05 MED ORDER — MEPERIDINE HCL 100 MG/ML IJ SOLN
INTRAMUSCULAR | Status: AC
  Filled 2017-01-05: qty 2

## 2017-01-05 MED ORDER — MIDAZOLAM HCL 5 MG/5ML IJ SOLN
INTRAMUSCULAR | Status: DC | PRN
  Administered 2017-01-05: 2 mg via INTRAVENOUS

## 2017-01-05 MED ORDER — MIDAZOLAM HCL 5 MG/5ML IJ SOLN
INTRAMUSCULAR | Status: AC
  Filled 2017-01-05: qty 10

## 2017-01-05 MED ORDER — SODIUM CHLORIDE 0.9% FLUSH
INTRAVENOUS | Status: AC
  Filled 2017-01-05: qty 10

## 2017-01-05 MED ORDER — LIDOCAINE VISCOUS 2 % MT SOLN
OROMUCOSAL | Status: DC | PRN
Start: 2017-01-05 — End: 2017-01-05
  Administered 2017-01-05: 1 via OROMUCOSAL

## 2017-01-05 MED ORDER — ONDANSETRON HCL 4 MG/2ML IJ SOLN
INTRAMUSCULAR | Status: DC | PRN
  Administered 2017-01-05: 4 mg via INTRAVENOUS

## 2017-01-05 MED ORDER — ONDANSETRON HCL 4 MG/2ML IJ SOLN
INTRAMUSCULAR | Status: AC
  Filled 2017-01-05: qty 2

## 2017-01-05 MED ORDER — SODIUM CHLORIDE 0.9 % IV SOLN
INTRAVENOUS | Status: DC
  Administered 2017-01-05: 1000 mL via INTRAVENOUS

## 2017-01-05 MED ORDER — ONDANSETRON HCL 4 MG/2ML IJ SOLN: 4.0000 mg | Freq: Four times a day (QID) | INTRAMUSCULAR | Status: DC | PRN

## 2017-01-05 NOTE — Progress Notes (Signed)
1550 Patient is now a med-surg patient and is transferring to Dept 300 room# 313. Report called and given to receiving nurse Tedd Sias, RN. Family made aware.

## 2017-01-05 NOTE — Progress Notes (Signed)
PROGRESS NOTE    [name]   DXI:[id number]  DOB: 30-Jan-1956  DOA: 01/03/2017 PCP: Redmond School, MD   Brief Narrative:  Albert [name] 61 year old male with a history of ESRD (TTS), atrial fibrillation, hypertension, diabetes mellitus, and hyperlipidemia presenting with generalized weakness with associated nausea, vomiting, and diarrhea for 1 week. The patient has been complaining of hematochezia and melanotic stools for the past 6-7 days.  He denies any hematemesis.  He denies any fevers, chills, chest pain,  coughing, hemoptysis, abdominal pain.  The patient denies any new medications.  Fortunately, he states that his vomiting has gradually improved over the past 24 hours.  He has not had any emesis or hematochezia since admission to the hospital.  In the emergency department, the patient was afebrile and hemodynamically stable although his blood pressures have been soft with systolic blood pressures in the 90s.  WBC was 6.3 with hemoglobin 11.4.  INR was greater than 10.0.  FOBT was positive.  Nephrology and GI were consulted to assist with management.  Subjective: No complaints.  ROS: no complaints of nausea, vomiting, constipation diarrhea, cough, dyspnea or dysuria. No other complaints.   Assessment & Plan:   Principal Problem:   GI bleed - EGD today:  Multiple small erosions and ulcerations within the bulb extending well into the second portion of the duodenum. Bulging ampulla of Vater with fresh blood at the orifice. Prominent extrinsic compression, at least 3-4 cm, on duodenal wall just distal to ampulla. abnormal gastric mucosa biopsied - f/u MRI - no overt bloody stools in 2 days - protonix infusion   Active Problems:   Supratherapeutic INR - given Vit K and 1 U FFP - INR now improved    Atrial fibrillation -cont diltiazem- Carvedilol on hold  Anemia of chronic disease - no drop in Hb yet  Nonischemic cardiomyopathy -November 26, 2016 Echo  EF 50-55%, no WMA, mild TR -Holding carvedilol secondary to soft blood pressure -daily weights     ESRD (end stage renal disease)  - Nephrology following    DVT prophylaxis: SCD Code Status: Full code Family Communication:  Disposition Plan: home when stable Consultants:   GI Procedures:   EGD Antimicrobials:  Anti-infectives (From admission, onward)   None       Objective: Vitals:   01/05/17 1450 01/05/17 1455 01/05/17 1500 01/05/17 1505  BP: (!) 97/53 (!) 106/56 (!) 90/48 97/63  Pulse: (!) 112 (!) 104 100 100  Resp: (!) 26 (!) 26 (!) 25 (!) 25  Temp:      TempSrc:      SpO2: 92% 95% 94% 93%  Weight:      Height:        Intake/Output Summary (Last 24 hours) at 01/05/2017 1744 Last data filed at 01/05/2017 0400 Gross per 24 hour  Intake 550 ml  Output -  Net 550 ml   Filed Weights   01/04/17 1325 01/04/17 1733 01/05/17 0500  Weight: 104.1 kg (229 lb 8 oz) 102.6 kg (226 lb 3.1 oz) 103.7 kg (228 lb 9.9 oz)    Examination: General exam: Appears comfortable  HEENT: PERRLA, oral mucosa moist, no sclera icterus or thrush Respiratory system: Clear to auscultation. Respiratory effort normal. Cardiovascular system: S1 & S2 heard, RRR.  No murmurs  Gastrointestinal system: Abdomen soft, non-tender, nondistended. Normal bowel sound. No organomegaly Central nervous system: Alert and oriented. No focal neurological deficits. Extremities: No cyanosis, clubbing or edema Skin: No rashes or ulcers Psychiatry:  Mood &  affect appropriate.     Data Reviewed: I have personally reviewed following labs and imaging studies  CBC: Recent Labs  Lab 01/03/17 1706 01/04/17 0400 01/04/17 0921 01/04/17 1936 01/04/17 2127 01/05/17 0336  WBC 6.3 8.1  --   --   --  7.9  HGB 11.4* 11.4* 10.6* 10.9* 11.0* 10.9*  10.9*  HCT 34.1* 35.0* 33.8* 32.9* 33.2* 33.1*  32.7*  MCV 94.7 96.4  --   --   --  93.0  PLT 183 203  --   --   --  809   Basic Metabolic Panel: Recent Labs    Lab 01/03/17 1706 01/04/17 0400 01/05/17 0336  NA 135 132* 134*  K 5.1 6.2* 4.4  CL 95* 91* 95*  CO2 11* 7* 17*  GLUCOSE 128* 105* 124*  BUN 95* 99* 48*  CREATININE 13.08* 13.84* 8.00*  CALCIUM 7.8* 7.7* 7.6*  PHOS  --   --  5.9*   GFR: Estimated Creatinine Clearance: 12.3 mL/min (A) (by C-G formula based on SCr of 8 mg/dL (H)). Liver Function Tests: Recent Labs  Lab 01/03/17 1706 01/04/17 0400 01/05/17 0336  AST 42* 43*  --   ALT 48 44  --   ALKPHOS 40 39  --   BILITOT 1.1 1.1  --   PROT 6.9 6.5  --   ALBUMIN 2.7* 2.5* 2.7*   No results for input(s): LIPASE, AMYLASE in the last 168 hours. No results for input(s): AMMONIA in the last 168 hours. Coagulation Profile: Recent Labs  Lab 01/03/17 1823 01/04/17 0400 01/05/17 0336  INR >10.00* >10.00* 1.95   Cardiac Enzymes: No results for input(s): CKTOTAL, CKMB, CKMBINDEX, TROPONINI in the last 168 hours. BNP (last 3 results) No results for input(s): PROBNP in the last 8760 hours. HbA1C: No results for input(s): HGBA1C in the last 72 hours. CBG: Recent Labs  Lab 01/04/17 1146 01/04/17 1619 01/04/17 2132 01/05/17 0728 01/05/17 1234  GLUCAP 150* 86 73 128* 97   Lipid Profile: No results for input(s): CHOL, HDL, LDLCALC, TRIG, CHOLHDL, LDLDIRECT in the last 72 hours. Thyroid Function Tests: No results for input(s): TSH, T4TOTAL, FREET4, T3FREE, THYROIDAB in the last 72 hours. Anemia Panel: No results for input(s): VITAMINB12, FOLATE, FERRITIN, TIBC, IRON, RETICCTPCT in the last 72 hours. Urine analysis:    Component Value Date/Time   COLORURINE YELLOW 10/12/2013 1045   APPEARANCEUR CLEAR 10/12/2013 1045   LABSPEC 1.015 10/12/2013 1045   PHURINE 8.0 10/12/2013 1045   GLUCOSEU NEGATIVE 10/12/2013 1045   HGBUR MODERATE (A) 10/12/2013 1045   BILIRUBINUR NEGATIVE 10/12/2013 1045   KETONESUR NEGATIVE 10/12/2013 1045   PROTEINUR 30 (A) 10/12/2013 1045   UROBILINOGEN 1.0 10/12/2013 1045   NITRITE NEGATIVE  10/12/2013 1045   LEUKOCYTESUR NEGATIVE 10/12/2013 1045   Sepsis Labs: @LABRCNTIP (procalcitonin:4,lacticidven:4) ) Recent Results (from the past 240 hour(s))  MRSA PCR Screening     Status: None   Collection Time: 01/03/17  9:30 PM  Result Value Ref Range Status   MRSA by PCR NEGATIVE NEGATIVE Final    Comment:        The GeneXpert MRSA Assay (FDA approved for NASAL specimens only), is one component of a comprehensive MRSA colonization surveillance program. It is not intended to diagnose MRSA infection nor to guide or monitor treatment for MRSA infections.          Radiology Studies: Mr Abdomen Mrcp Wo Contrast  Result Date: 01/05/2017 CLINICAL DATA:  Abdominal pain and constipation for 2 weeks. EGD today.  GI bleed. Pancreatic adenocarcinoma suspected. History of renal cell carcinoma. EXAM: MRI ABDOMEN WITHOUT CONTRAST  (INCLUDING MRCP) TECHNIQUE: Multiplanar multisequence MR imaging of the abdomen was performed. Heavily T2-weighted images of the biliary and pancreatic ducts were obtained, and three-dimensional MRCP images were rendered by post processing. COMPARISON:  CT of 05/07/2016.  Endoscopy report of earlier today. FINDINGS: Moderate to markedly motion degraded exam throughout. Exam is also degraded by lack of IV contrast. Lower chest: Right hemidiaphragm elevation with probable atelectasis. Moderate cardiomegaly. No pleural fluid. Hepatobiliary: Multiple T2 hyperintense liver lesions are likely cysts. Grossly normal gallbladder, without biliary duct dilatation. Normal common duct caliber, including at 5 mm on image 7/ series 100. Pancreas: Given above limitations, there is no evidence of pancreatic mass or duct dilatation. There is suggestion of peripancreatic edema, including about the tail on image 27/series 15 and the head/ body on image 34/ series 18. No peripancreatic fluid collection. The pancreatic uncinate process is not well evaluated. Spleen:  Iron deposition within.  Adrenals/Urinary Tract: Adrenal glands not well evaluated. Right nephrectomy. Multiple left renal lesions of varying complexity, not well evaluated. Stomach/Bowel: Grossly normal stomach and abdominal bowel loops. Vascular/Lymphatic: Normal caliber of the abdominal aorta and branch vessels. Retroperitoneal nodes not well evaluated. Other:  No gross ascites. Musculoskeletal: No acute osseous abnormality. IMPRESSION: 1. Moderate to markedly motion degraded exam. 2. Given this limitation, no dominant pancreatic mass is seen. Findings suspicious for non complicated pancreatitis. Electronically Signed   By: Abigail Miyamoto M.D.   On: 01/05/2017 17:04   Mr 3d Recon At Scanner  Result Date: 01/05/2017 CLINICAL DATA:  Abdominal pain and constipation for 2 weeks. EGD today. GI bleed. Pancreatic adenocarcinoma suspected. History of renal cell carcinoma. EXAM: MRI ABDOMEN WITHOUT CONTRAST  (INCLUDING MRCP) TECHNIQUE: Multiplanar multisequence MR imaging of the abdomen was performed. Heavily T2-weighted images of the biliary and pancreatic ducts were obtained, and three-dimensional MRCP images were rendered by post processing. COMPARISON:  CT of 05/07/2016.  Endoscopy report of earlier today. FINDINGS: Moderate to markedly motion degraded exam throughout. Exam is also degraded by lack of IV contrast. Lower chest: Right hemidiaphragm elevation with probable atelectasis. Moderate cardiomegaly. No pleural fluid. Hepatobiliary: Multiple T2 hyperintense liver lesions are likely cysts. Grossly normal gallbladder, without biliary duct dilatation. Normal common duct caliber, including at 5 mm on image 7/ series 100. Pancreas: Given above limitations, there is no evidence of pancreatic mass or duct dilatation. There is suggestion of peripancreatic edema, including about the tail on image 27/series 15 and the head/ body on image 34/ series 18. No peripancreatic fluid collection. The pancreatic uncinate process is not well evaluated.  Spleen:  Iron deposition within. Adrenals/Urinary Tract: Adrenal glands not well evaluated. Right nephrectomy. Multiple left renal lesions of varying complexity, not well evaluated. Stomach/Bowel: Grossly normal stomach and abdominal bowel loops. Vascular/Lymphatic: Normal caliber of the abdominal aorta and branch vessels. Retroperitoneal nodes not well evaluated. Other:  No gross ascites. Musculoskeletal: No acute osseous abnormality. IMPRESSION: 1. Moderate to markedly motion degraded exam. 2. Given this limitation, no dominant pancreatic mass is seen. Findings suspicious for non complicated pancreatitis. Electronically Signed   By: Abigail Miyamoto M.D.   On: 01/05/2017 17:04   Dg Chest Port 1 View  Result Date: 01/04/2017 CLINICAL DATA:  Dyspnea, shortness of breath. History of cardiomyopathy, atrial fibrillation end-stage renal disease with right nephrectomy. Former smoker. EXAM: PORTABLE CHEST 1 VIEW COMPARISON:  Chest x-ray of October 14, 2013. FINDINGS: The images obtained in a lordotic  fashion. The right hemidiaphragm is chronically elevated. This limits inflation of the right lung. The left lung is better inflated. The interstitial markings are coarse bilaterally and more conspicuous today. The cardiac silhouette is enlarged. The pulmonary vascularity is more engorged than in the past with some cephalization. There is calcification in the wall of the aortic arch. IMPRESSION: Somewhat limited study due to patient positioning and mild hypoinflation. Mild pulmonary vascular congestion. Stable cardiomegaly. No pleural effusion or pneumonia. Thoracic aortic atherosclerosis. Electronically Signed   By: David  Martinique M.D.   On: 01/04/2017 10:30      Scheduled Meds: . allopurinol  100 mg Oral Daily  . cinacalcet  30 mg Oral QHS  . dextrose  1 ampule Intravenous Once  . diltiazem  60 mg Oral Q6H  . feeding supplement (NEPRO CARB STEADY)  237 mL Oral BID BM  . insulin aspart  0-9 Units Subcutaneous TID WC   . lidocaine      . meperidine      . midazolam      . ondansetron      . pravastatin  5 mg Oral QPM  . sevelamer carbonate  3,200 mg Oral TID WC  . sodium chloride bacteriostatic      . sodium chloride flush  3 mL Intravenous Q12H  . sodium chloride flush       Continuous Infusions: . sodium chloride    . sodium chloride    . sodium chloride    . pantoprozole (PROTONIX) infusion 8 mg/hr (01/05/17 1637)     LOS: 2 days    Time spent in minutes: 35    Debbe Odea, MD [facility] Pager: [email or web address] Password [facility] 01/05/2017, 5:44 PM

## 2017-01-05 NOTE — Interval H&P Note (Signed)
History and Physical Interval Note:  01/05/2017 2:37 PM  [name]  has presented today for surgery, with the diagnosis of melena, nausea  The various methods of treatment have been discussed with the patient and family. After consideration of risks, benefits and other options for treatment, the patient has consented to  Procedure(s): ESOPHAGOGASTRODUODENOSCOPY (EGD) (N/A) as a surgical intervention .  The patient's history has been reviewed, patient examined, no change in status, stable for surgery.  I have reviewed the patient's chart and labs.  Questions were answered to the patient's satisfaction.     Manus Rudd  Patient seen and examined in endoscopy.  Hemoglobin 10.9 this morning INR improved to 1.95. Patient denies dysphagia. Here for EGD for diagnostic purposes.  I have offered the patient diagnostic EGD with possible therapeutic intervention as appropriate.  The risks, benefits, limitations, alternatives and imponderables have been reviewed with the patient. Potential for esophageal dilation, biopsy, etc. have also been reviewed.  Questions have been answered. All parties agreeable.

## 2017-01-05 NOTE — Progress Notes (Signed)
Albert [name]  MRN: [id number]  DOB/AGE: 63-Aug-1957 63 y.o.  Primary Care Physician:Fusco, Purcell Nails, MD  Admit date: 01/03/2017  Chief Complaint:  Chief Complaint  Patient presents with  . GI Bleeding    S-Pt presented on  01/03/2017 with  Chief Complaint  Patient presents with  . GI Bleeding  .    Pt today feels better.pt says " I am so much better than yesterday "   Meds . allopurinol  100 mg Oral Daily  . cinacalcet  30 mg Oral QHS  . dextrose  1 ampule Intravenous Once  . diltiazem  60 mg Oral Q6H  . feeding supplement (NEPRO CARB STEADY)  237 mL Oral BID BM  . insulin aspart  0-9 Units Subcutaneous TID WC  . pravastatin  5 mg Oral QPM  . sevelamer carbonate  3,200 mg Oral TID WC  . sodium chloride flush  3 mL Intravenous Q12H       Physical Exam: Vital signs in last 24 hours: Temp:  [97.2 F (36.2 C)-98.7 F (37.1 C)] 97.8 F (36.6 C) (11/14 0400) Pulse Rate:  [29-129] 98 (11/14 0800) Resp:  [15-40] 26 (11/14 0800) BP: (70-129)/(38-77) 129/75 (11/14 0800) SpO2:  [90 %-100 %] 95 % (11/14 0800) Weight:  [226 lb 3.1 oz (102.6 kg)-229 lb 8 oz (104.1 kg)] 228 lb 9.9 oz (103.7 kg) (11/14 0500) Weight change: -8 oz (-0.227 kg) Last BM Date: 01/03/17  Intake/Output from previous day: 11/13 0701 - 11/14 0700 In: 1110 [I.V.:800; Blood:310] Out: 1700  No intake/output data recorded.   Physical Exam: General- pt is awake,alert, oriented to time place and person Resp- No acute REsp distress, Rhonchi+  CVS- S1S2 regular ij rate and rhythm GIT- BS+, soft, NT, ND EXT- NO LE Edema, Cyanosis Access -left AVF   Lab Results: CBC Recent Labs    01/04/17 0400  01/04/17 2127 01/05/17 0336  WBC 8.1  --   --  7.9  HGB 11.4*   < > 11.0* 10.9*  10.9*  HCT 35.0*   < > 33.2* 33.1*  32.7*  PLT 203  --   --  186   < > = values in this interval not displayed.    BMET Recent Labs    01/04/17 0400 01/05/17 0336  NA 132* 134*  K 6.2* 4.4  CL 91* 95*   CO2 7* 17*  GLUCOSE 105* 124*  BUN 99* 48*  CREATININE 13.84* 8.00*  CALCIUM 7.7* 7.6*    MICRO Recent Results (from the past 240 hour(s))  MRSA PCR Screening     Status: None   Collection Time: 01/03/17  9:30 PM  Result Value Ref Range Status   MRSA by PCR NEGATIVE NEGATIVE Final    Comment:        The GeneXpert MRSA Assay (FDA approved for NASAL specimens only), is one component of a comprehensive MRSA colonization surveillance program. It is not intended to diagnose MRSA infection nor to guide or monitor treatment for MRSA infections.       Lab Results  Component Value Date   PTH 350.0 (H) 03/21/2007   CALCIUM 7.6 (L) 01/05/2017   CAION 1.11 (L) 03/10/2015   PHOS 5.9 (H) 01/05/2017               Impression: 1)Renal  ESRD on HD                Pt is on Tuesday/Thursday/Saturday schedule .  Pt was last dialyzed yesterday as pt was hyperkalemic                No need of HD today.   2)HTN  Medication- On Calcium Channel Blockers   3)Anemia In ESRD the goal for HGab is 9--11. Hx of Anemia of chronic ds Now admitted wit GI bleed GI team and Primary team following Will keep on epo   4)CKD Mineral-Bone Disorder Secondary Hyperparathyroidism present. Phosphorus near to goal.  On binders  5)GI -admitted with GI Bleed GI team and Primary MD following  6)Electrolytes  Hyperkalemic    Now better  Hyponatremic Sec to ESRD  7)Acid base Co2 not at goal Acidosis much better    Plan:  Will dialyze today Will use 2k bath Will keep on epo      Mancil Pfenning S 01/05/2017, 9:21 AM

## 2017-01-05 NOTE — Progress Notes (Signed)
Patient briefly evaluated for consideration of EGD today.  His INR is down to 1.95.  Hemoglobin stable at 10.9, electrolytes appropriate.  Patient was dialyzed yesterday.  Plan for upper endoscopy today for evaluation of melena, nausea.  Laureen Ochs. Bernarda Caffey [facility] [phone] 11/14/20188:54 AM

## 2017-01-05 NOTE — Op Note (Signed)
[facility] Patient Name: Albert [name] Procedure Date: 01/05/2017 2:31 PM MRN: [id number] Date of Birth: 10-Jan-1956 Attending MD: Norvel Richards , MD CSN: [id number] Age: 61 Admit Type: Outpatient Procedure:                Upper GI endoscopy Indications:              Melena Providers:                Norvel Richards, MD, Lurline Del, RN, Aram Candela Referring MD:              Medicines:                Midazolam 2 mg IV, Meperidine 25 mg IV, Ondansetron                            4 mg IV Complications:            No immediate complications. Estimated Blood Loss:     Estimated blood loss was minimal. Procedure:                Pre-Anesthesia Assessment:                           - Prior to the procedure, a History and Physical                            was performed, and patient medications and                            allergies were reviewed. The patient's tolerance of                            previous anesthesia was also reviewed. The risks                            and benefits of the procedure and the sedation                            options and risks were discussed with the patient.                            All questions were answered, and informed consent                            was obtained. Prior Anticoagulants: The patient                            last took Coumadin (warfarin) 5 days prior to the                            procedure. ASA Grade Assessment: III - A patient  with severe systemic disease. After reviewing the                            risks and benefits, the patient was deemed in                            satisfactory condition to undergo the procedure.                           After obtaining informed consent, the endoscope was                            passed under direct vision. Throughout the                            procedure, the patient's blood pressure, pulse,  and                            oxygen saturations were monitored continuously. The                            [id number] ([id number]) scope was introduced through the                            mouth, and advanced to the third part of duodenum.                            The upper GI endoscopy was accomplished without                            difficulty. The patient tolerated the procedure                            well. Scope In: 2:45:42 PM Scope Out: 2:53:01 PM Total Procedure Duration: 0 hours 7 minutes 19 seconds  Findings:      The examined esophagus was normal except for somewhat irregular Z line.      diffuse submucosal gastric petechiae without ulcer or infiltrating       process seen. Pylorus patent. Multiple small erosions and ulcerations       within the bulb extending well into the second portion of the duodenum.       Bulging ampulla of Vater with fresh blood at the orifice. Prominent       extrinsic compression, at least 3-4 cm, on duodenal wall just distal to       ampulla.      abnormal gastric mucosa biopsied. Estimated blood loss was minimal. Impression:               - Normal esophagus.                           - Diffuse submucosal gastric petechiae?"status post                            gastric biopsy-  Multiple abnormalities involving the duodenum as                            described above. Upper GI bleeding secondary to                            either hemobilia or Hemosucus Pancreaticus. This is                            concerning for underlying malignancy. Moderate Sedation:      Moderate (conscious) sedation was administered by the endoscopy nurse       and supervised by the endoscopist. The following parameters were       monitored: oxygen saturation, heart rate, blood pressure, respiratory       rate, EKG, adequacy of pulmonary ventilation, and response to care.       Total physician intraservice time was 12  minutes. Recommendation:           - Return patient to hospital ward for ongoing care.                           - Clear liquid diet. Noncontrast pancreatic                            protocol MRI to further evaluate findings today                            (iscuss with Dr. Zigmund Daniel). Follow up on pathology.                           - Continue present medications. Procedure Code(s):        --- Professional ---                           [phone], Esophagogastroduodenoscopy, flexible,                            transoral; with biopsy, single or multiple                           99152, Moderate sedation services provided by the                            same physician or other qualified health care                            professional performing the diagnostic or                            therapeutic service that the sedation supports,                            requiring the presence of an independent trained                            observer to  assist in the monitoring of the                            patient's level of consciousness and physiological                            status; initial 15 minutes of intraservice time,                            patient age 53 years or older Diagnosis Code(s):        --- Professional ---                           K31.89, Other diseases of stomach and duodenum                           K92.1, Melena (includes Hematochezia) CPT copyright 2016 American Medical Association. All rights reserved. The codes documented in this report are preliminary and upon coder review may  be revised to meet current compliance requirements. Cristopher Estimable. Dash Cardarelli, MD Norvel Richards, MD 01/05/2017 3:16:50 PM This report has been signed electronically. Number of Addenda: 0

## 2017-01-06 DIAGNOSIS — K2990 Gastroduodenitis, unspecified, without bleeding: Secondary | ICD-10-CM

## 2017-01-06 DIAGNOSIS — R198 Other specified symptoms and signs involving the digestive system and abdomen: Secondary | ICD-10-CM

## 2017-01-06 DIAGNOSIS — K2970 Gastritis, unspecified, without bleeding: Secondary | ICD-10-CM

## 2017-01-06 LAB — GLUCOSE, CAPILLARY
GLUCOSE-CAPILLARY: 108 mg/dL — AB (ref 65–99)
GLUCOSE-CAPILLARY: 109 mg/dL — AB (ref 65–99)
GLUCOSE-CAPILLARY: 139 mg/dL — AB (ref 65–99)
Glucose-Capillary: 93 mg/dL (ref 65–99)

## 2017-01-06 LAB — BASIC METABOLIC PANEL
Anion gap: 17 — ABNORMAL HIGH (ref 5–15)
BUN: 66 mg/dL — AB (ref 6–20)
CHLORIDE: 94 mmol/L — AB (ref 101–111)
CO2: 20 mmol/L — AB (ref 22–32)
Calcium: 7.5 mg/dL — ABNORMAL LOW (ref 8.9–10.3)
Creatinine, Ser: 10.2 mg/dL — ABNORMAL HIGH (ref 0.61–1.24)
GFR calc non Af Amer: 5 mL/min — ABNORMAL LOW (ref >60)
GFR, EST AFRICAN AMERICAN: 6 mL/min — AB (ref >60)
Glucose, Bld: 108 mg/dL — ABNORMAL HIGH (ref 65–99)
POTASSIUM: 4.2 mmol/L (ref 3.5–5.1)
SODIUM: 131 mmol/L — AB (ref 135–145)

## 2017-01-06 LAB — CBC
HEMATOCRIT: 31.6 % — AB (ref 39.0–52.0)
HEMOGLOBIN: 10.7 g/dL — AB (ref 13.0–17.0)
MCH: 31.2 pg (ref 26.0–34.0)
MCHC: 33.9 g/dL (ref 30.0–36.0)
MCV: 92.1 fL (ref 78.0–100.0)
Platelets: 139 10*3/uL — ABNORMAL LOW (ref 150–400)
RBC: 3.43 MIL/uL — AB (ref 4.22–5.81)
RDW: 15 % (ref 11.5–15.5)
WBC: 8.3 10*3/uL (ref 4.0–10.5)

## 2017-01-06 LAB — PROTIME-INR
INR: 1.62
PROTHROMBIN TIME: 19.1 s — AB (ref 11.4–15.2)

## 2017-01-06 MED ORDER — CARVEDILOL 25 MG PO TABS: ORAL_TABLET | ORAL | 3 refills | Status: DC

## 2017-01-06 MED ORDER — PANTOPRAZOLE SODIUM 40 MG PO TBEC
40.0000 mg | DELAYED_RELEASE_TABLET | Freq: Two times a day (BID) | ORAL | Status: DC
  Administered 2017-01-06: 40 mg via ORAL
  Filled 2017-01-06: qty 1

## 2017-01-06 MED ORDER — PANTOPRAZOLE SODIUM 40 MG PO TBEC: 40.0000 mg | DELAYED_RELEASE_TABLET | Freq: Two times a day (BID) | ORAL | 0 refills | Status: DC

## 2017-01-06 MED ORDER — PANTOPRAZOLE SODIUM 40 MG IV SOLR
INTRAVENOUS | Status: AC
  Filled 2017-01-06: qty 80

## 2017-01-06 MED ORDER — DILTIAZEM HCL ER COATED BEADS 120 MG PO CP24
240.0000 mg | ORAL_CAPSULE | Freq: Every day | ORAL | Status: DC
  Administered 2017-01-06: 240 mg via ORAL
  Filled 2017-01-06: qty 2

## 2017-01-06 MED ORDER — DILTIAZEM HCL ER BEADS 300 MG PO CP24: 300.0000 mg | ORAL_CAPSULE | Freq: Every day | ORAL | Status: DC

## 2017-01-06 MED ORDER — DILTIAZEM HCL ER COATED BEADS 240 MG PO CP24: 240.0000 mg | ORAL_CAPSULE | Freq: Every day | ORAL | 0 refills | Status: DC

## 2017-01-06 NOTE — Discharge Instructions (Signed)
Your Diltiazem dose has been adjusted slightly due to low BP.  Avoid Aspirin, Ibuprofen, Naprosyn for now. OK to take Acetaminophen (Tylenol).  Please take all your medications with you for your next visit with your Primary MD. Please request your Primary MD to go over all hospital test results at the follow up. Please ask your Primary MD to get all Hospital records sent to his/her office.  If you experience worsening of your admission symptoms, develop shortness of breath, chest pain, suicidal or homicidal thoughts or a life threatening emergency, you must seek medical attention immediately by calling 911 or calling your MD.  Dennis Bast must read the complete instructions/literature along with all the possible adverse reactions/side effects for all the medicines you take including new medications that have been prescribed to you. Take new medicines after you have completely understood and accpet all the possible adverse reactions/side effects.   Do not drive when taking pain medications or sedatives.    Do not take more than prescribed Pain, Sleep and Anxiety Medications  If you have smoked or chewed Tobacco in the last 2 yrs please stop. Stop any regular alcohol and or recreational drug use.  Wear Seat belts while driving.

## 2017-01-06 NOTE — Progress Notes (Signed)
Discharge instructions and prescription given, verbalized understanding, out in stable condition via w/c with staff. 

## 2017-01-06 NOTE — Discharge Summary (Signed)
Physician Discharge Summary  Albert [name] VQM:[id number] DOB: 30-Aug-1955 DOA: 01/03/2017  PCP: Redmond School, MD  Admit date: 01/03/2017 Discharge date: 01/06/2017  Admitted From: home Disposition:  home   Recommendations for Outpatient Follow-up:  1. F/u Hb in 1 month 2. INR to be checked on Monday Discharge Condition:  stable   CODE STATUS:  Full code   Consultations:  GI  Neprology    Discharge Diagnoses:  Principal Problem:   GI bleed-   Gastritis and gastroduodenitis Active Problems:    Supratherapeutic INR   Atrial fibrillation (HCC)   ESRD (end stage renal disease) (HCC)   Anemia     Subjective: No BM. No nausea, vomiting or abdominal pain. Tolerating solid food. No other complaints.  HPI [name] 61 year old male with a history of ESRD(TTS), atrial fibrillation, hypertension, diabetes mellitus, and hyperlipidemia presenting withgeneralized weakness with associatednausea, vomiting, and diarrhea for 1 week. The patient has been complaining ofhematochezia andmelanotic stools for the past6-7days. He denies any hematemesis. He denies any fevers, chills, chest pain, coughing, hemoptysis, abdominal pain. The patient denies any new medications. Fortunately, he states that his vomiting has gradually improved over the past 24 hours. He has not had any emesis or hematochezia since admission to the hospital.  In the emergency department, the patient was afebrile and hemodynamically stable although his blood pressures have been soft with systolic blood pressures in the 90s. WBC was 6.3 with hemoglobin 11.4. INR was greater than 10.0. FOBT was positive. Nephrology and GI were consulted to assist with management.    Hospital Course:  GI bleed - 11/14- EGD :  Multiple small erosions and ulcerations within the bulb extending well into the second portion of the duodenum. Bulging ampulla of Vater with fresh blood at the orifice. Prominent  extrinsic compression, at least 3-4 cm, on duodenal wall just distal to ampulla. abnormal gastric mucosa biopsied -  MRI- significant motion artifact- no dominant pancreatic mass is seen-  Findings suspicious for non complicated pancreatitis - no overt bloody stools in 2 days - protonix infusion switched to BID Protonix - diet advanced today without issues - ok to resume anticoagulation as long as INR is controlled- per patient, INR is usually controlled but he had poor oral intake when he developed GI symptoms and he attributes this to his increased INR-   Active Problems:   Supratherapeutic INR - given Vit K and 1 U FFP - INR 1/62 today - will resume Coumadin at home dose (see above)- we are making him an appt to f/u in the coumadin clinic on Monday    Atrial fibrillation -cont diltiazem initially and then dose decreased from 300 to 240 mg - cont Carvedilol   Anemia of chronic disease -   Hb 11.4 >> 10.7  Nonischemic cardiomyopathy -November 26, 2016 Echo EF 50-55%, no WMA, mild TR -Holding carvedilol secondary to soft blood pressure -daily weights     ESRD (end stage renal disease)  - Nephrology managing       Discharge Instructions  Discharge Instructions    Increase activity slowly   Complete by:  As directed      Allergies as of 01/06/2017   No Known Allergies     Medication List    STOP taking these medications   diltiazem 300 MG 24 hr capsule Commonly known as:  TIAZAC Replaced by:  diltiazem 240 MG 24 hr capsule     TAKE these medications   allopurinol 100 MG tablet Commonly known  as:  ZYLOPRIM Take 100 mg by mouth daily.   carvedilol 25 MG tablet Commonly known as:  COREG take 1/2 tablet two times daily What changed:    how much to take  how to take this  when to take this  additional instructions   cinacalcet 30 MG tablet Commonly known as:  SENSIPAR Take 30 mg by mouth at bedtime.   diltiazem 240 MG 24 hr capsule Commonly  known as:  CARDIZEM CD Take 1 capsule (240 mg total) daily by mouth. Start taking on:  01/07/2017 Replaces:  diltiazem 300 MG 24 hr capsule   docusate sodium 100 MG capsule Commonly known as:  COLACE Take 100 mg by mouth daily.   fenofibrate 160 MG tablet Take 160 mg every evening by mouth.   fish oil-omega-3 fatty acids 1000 MG capsule Take 1 g by mouth 3 (three) times daily.   linagliptin 5 MG Tabs tablet Commonly known as:  TRADJENTA Take 5 mg by mouth daily.   metFORMIN 750 MG 24 hr tablet Commonly known as:  GLUCOPHAGE-XR Take 750 mg daily by mouth.   pantoprazole 40 MG tablet Commonly known as:  PROTONIX Take 1 tablet (40 mg total) 2 (two) times daily before a meal by mouth.   pravastatin 10 MG tablet Commonly known as:  PRAVACHOL Take 5 mg by mouth every evening.   sevelamer carbonate 800 MG tablet Commonly known as:  RENVELA Take 1,600-3,200 mg See admin instructions by mouth. Take 4 tablets after each meal and 2 after each snack   VISINE OP Place 2 drops into both eyes daily as needed (dry eyes).   warfarin 2.5 MG tablet Commonly known as:  COUMADIN Take as directed. If you are unsure how to take this medication, talk to your nurse or doctor. Original instructions:  Take 1 tablet daily except 1 1/2 tablets on Sundays, Tuesdays and Thursdays or as directed What changed:    how much to take  how to take this  when to take this  additional instructions       No Known Allergies   Procedures/Studies:    Mr Abdomen Mrcp Wo Contrast  Result Date: 01/05/2017 CLINICAL DATA:  Abdominal pain and constipation for 2 weeks. EGD today. GI bleed. Pancreatic adenocarcinoma suspected. History of renal cell carcinoma. EXAM: MRI ABDOMEN WITHOUT CONTRAST  (INCLUDING MRCP) TECHNIQUE: Multiplanar multisequence MR imaging of the abdomen was performed. Heavily T2-weighted images of the biliary and pancreatic ducts were obtained, and three-dimensional MRCP images were  rendered by post processing. COMPARISON:  CT of 05/07/2016.  Endoscopy report of earlier today. FINDINGS: Moderate to markedly motion degraded exam throughout. Exam is also degraded by lack of IV contrast. Lower chest: Right hemidiaphragm elevation with probable atelectasis. Moderate cardiomegaly. No pleural fluid. Hepatobiliary: Multiple T2 hyperintense liver lesions are likely cysts. Grossly normal gallbladder, without biliary duct dilatation. Normal common duct caliber, including at 5 mm on image 7/ series 100. Pancreas: Given above limitations, there is no evidence of pancreatic mass or duct dilatation. There is suggestion of peripancreatic edema, including about the tail on image 27/series 15 and the head/ body on image 34/ series 18. No peripancreatic fluid collection. The pancreatic uncinate process is not well evaluated. Spleen:  Iron deposition within. Adrenals/Urinary Tract: Adrenal glands not well evaluated. Right nephrectomy. Multiple left renal lesions of varying complexity, not well evaluated. Stomach/Bowel: Grossly normal stomach and abdominal bowel loops. Vascular/Lymphatic: Normal caliber of the abdominal aorta and branch vessels. Retroperitoneal nodes not well evaluated.  Other:  No gross ascites. Musculoskeletal: No acute osseous abnormality. IMPRESSION: 1. Moderate to markedly motion degraded exam. 2. Given this limitation, no dominant pancreatic mass is seen. Findings suspicious for non complicated pancreatitis. Electronically Signed   By: Abigail Miyamoto M.D.   On: 01/05/2017 17:04   Mr 3d Recon At Scanner  Result Date: 01/05/2017 CLINICAL DATA:  Abdominal pain and constipation for 2 weeks. EGD today. GI bleed. Pancreatic adenocarcinoma suspected. History of renal cell carcinoma. EXAM: MRI ABDOMEN WITHOUT CONTRAST  (INCLUDING MRCP) TECHNIQUE: Multiplanar multisequence MR imaging of the abdomen was performed. Heavily T2-weighted images of the biliary and pancreatic ducts were obtained, and  three-dimensional MRCP images were rendered by post processing. COMPARISON:  CT of 05/07/2016.  Endoscopy report of earlier today. FINDINGS: Moderate to markedly motion degraded exam throughout. Exam is also degraded by lack of IV contrast. Lower chest: Right hemidiaphragm elevation with probable atelectasis. Moderate cardiomegaly. No pleural fluid. Hepatobiliary: Multiple T2 hyperintense liver lesions are likely cysts. Grossly normal gallbladder, without biliary duct dilatation. Normal common duct caliber, including at 5 mm on image 7/ series 100. Pancreas: Given above limitations, there is no evidence of pancreatic mass or duct dilatation. There is suggestion of peripancreatic edema, including about the tail on image 27/series 15 and the head/ body on image 34/ series 18. No peripancreatic fluid collection. The pancreatic uncinate process is not well evaluated. Spleen:  Iron deposition within. Adrenals/Urinary Tract: Adrenal glands not well evaluated. Right nephrectomy. Multiple left renal lesions of varying complexity, not well evaluated. Stomach/Bowel: Grossly normal stomach and abdominal bowel loops. Vascular/Lymphatic: Normal caliber of the abdominal aorta and branch vessels. Retroperitoneal nodes not well evaluated. Other:  No gross ascites. Musculoskeletal: No acute osseous abnormality. IMPRESSION: 1. Moderate to markedly motion degraded exam. 2. Given this limitation, no dominant pancreatic mass is seen. Findings suspicious for non complicated pancreatitis. Electronically Signed   By: Abigail Miyamoto M.D.   On: 01/05/2017 17:04   Dg Chest Port 1 View  Result Date: 01/04/2017 CLINICAL DATA:  Dyspnea, shortness of breath. History of cardiomyopathy, atrial fibrillation end-stage renal disease with right nephrectomy. Former smoker. EXAM: PORTABLE CHEST 1 VIEW COMPARISON:  Chest x-ray of October 14, 2013. FINDINGS: The images obtained in a lordotic fashion. The right hemidiaphragm is chronically elevated. This  limits inflation of the right lung. The left lung is better inflated. The interstitial markings are coarse bilaterally and more conspicuous today. The cardiac silhouette is enlarged. The pulmonary vascularity is more engorged than in the past with some cephalization. There is calcification in the wall of the aortic arch. IMPRESSION: Somewhat limited study due to patient positioning and mild hypoinflation. Mild pulmonary vascular congestion. Stable cardiomegaly. No pleural effusion or pneumonia. Thoracic aortic atherosclerosis. Electronically Signed   By: David  Martinique M.D.   On: 01/04/2017 10:30       Discharge Exam: Vitals:   01/06/17 1345 01/06/17 1350  BP: (!) 100/55 (!) 127/57  Pulse: 100 (!) 107  Resp:  20  Temp:  98.7 F (37.1 C)  SpO2:     Vitals:   01/06/17 1300 01/06/17 1330 01/06/17 1345 01/06/17 1350  BP: (!) 114/54 (!) 125/56 (!) 100/55 (!) 127/57  Pulse: 86 99 100 (!) 107  Resp:    20  Temp:    98.7 F (37.1 C)  TempSrc:    Oral  SpO2:      Weight:      Height:        General: Pt is alert,  awake, not in acute distress Cardiovascular: RRR, S1/S2 +, no rubs, no gallops Respiratory: CTA bilaterally, no wheezing, no rhonchi Abdominal: Soft, NT, ND, bowel sounds + Extremities: no edema, no cyanosis    The results of significant diagnostics from this hospitalization (including imaging, microbiology, ancillary and laboratory) are listed below for reference.     Microbiology: Recent Results (from the past 240 hour(s))  MRSA PCR Screening     Status: None   Collection Time: 01/03/17  9:30 PM  Result Value Ref Range Status   MRSA by PCR NEGATIVE NEGATIVE Final    Comment:        The GeneXpert MRSA Assay (FDA approved for NASAL specimens only), is one component of a comprehensive MRSA colonization surveillance program. It is not intended to diagnose MRSA infection nor to guide or monitor treatment for MRSA infections.      Labs: BNP (last 3 results) No  results for input(s): BNP in the last 8760 hours. Basic Metabolic Panel: Recent Labs  Lab 01/03/17 1706 01/04/17 0400 01/05/17 0336 01/06/17 0448  NA 135 132* 134* 131*  K 5.1 6.2* 4.4 4.2  CL 95* 91* 95* 94*  CO2 11* 7* 17* 20*  GLUCOSE 128* 105* 124* 108*  BUN 95* 99* 48* 66*  CREATININE 13.08* 13.84* 8.00* 10.20*  CALCIUM 7.8* 7.7* 7.6* 7.5*  PHOS  --   --  5.9*  --    Liver Function Tests: Recent Labs  Lab 01/03/17 1706 01/04/17 0400 01/05/17 0336  AST 42* 43*  --   ALT 48 44  --   ALKPHOS 40 39  --   BILITOT 1.1 1.1  --   PROT 6.9 6.5  --   ALBUMIN 2.7* 2.5* 2.7*   No results for input(s): LIPASE, AMYLASE in the last 168 hours. No results for input(s): AMMONIA in the last 168 hours. CBC: Recent Labs  Lab 01/03/17 1706 01/04/17 0400 01/04/17 0921 01/04/17 1936 01/04/17 2127 01/05/17 0336 01/06/17 0448  WBC 6.3 8.1  --   --   --  7.9 8.3  HGB 11.4* 11.4* 10.6* 10.9* 11.0* 10.9*  10.9* 10.7*  HCT 34.1* 35.0* 33.8* 32.9* 33.2* 33.1*  32.7* 31.6*  MCV 94.7 96.4  --   --   --  93.0 92.1  PLT 183 203  --   --   --  186 139*   Cardiac Enzymes: No results for input(s): CKTOTAL, CKMB, CKMBINDEX, TROPONINI in the last 168 hours. BNP: Invalid input(s): POCBNP CBG: Recent Labs  Lab 01/05/17 1421 01/05/17 1745 01/05/17 2223 01/06/17 0758 01/06/17 1203  GLUCAP 108* 104* 116* 93 109*   D-Dimer No results for input(s): DDIMER in the last 72 hours. Hgb A1c No results for input(s): HGBA1C in the last 72 hours. Lipid Profile No results for input(s): CHOL, HDL, LDLCALC, TRIG, CHOLHDL, LDLDIRECT in the last 72 hours. Thyroid function studies No results for input(s): TSH, T4TOTAL, T3FREE, THYROIDAB in the last 72 hours.  Invalid input(s): FREET3 Anemia work up No results for input(s): VITAMINB12, FOLATE, FERRITIN, TIBC, IRON, RETICCTPCT in the last 72 hours. Urinalysis    Component Value Date/Time   COLORURINE YELLOW 10/12/2013 1045   APPEARANCEUR CLEAR  10/12/2013 1045   LABSPEC 1.015 10/12/2013 1045   PHURINE 8.0 10/12/2013 1045   GLUCOSEU NEGATIVE 10/12/2013 1045   HGBUR MODERATE (A) 10/12/2013 1045   BILIRUBINUR NEGATIVE 10/12/2013 1045   KETONESUR NEGATIVE 10/12/2013 1045   PROTEINUR 30 (A) 10/12/2013 1045   UROBILINOGEN 1.0 10/12/2013 1045  NITRITE NEGATIVE 10/12/2013 [place] 10/12/2013 1045   Sepsis Labs Invalid input(s): PROCALCITONIN,  WBC,  LACTICIDVEN Microbiology Recent Results (from the past 240 hour(s))  MRSA PCR Screening     Status: None   Collection Time: 01/03/17  9:30 PM  Result Value Ref Range Status   MRSA by PCR NEGATIVE NEGATIVE Final    Comment:        The GeneXpert MRSA Assay (FDA approved for NASAL specimens only), is one component of a comprehensive MRSA colonization surveillance program. It is not intended to diagnose MRSA infection nor to guide or monitor treatment for MRSA infections.      Time coordinating discharge: Over 30 minutes  SIGNED:   Debbe Odea, MD  [facility] 01/06/2017, 2:50 PM Pager   If 7PM-7AM, please contact night-coverage [email or web address] Password [email or web address]

## 2017-01-06 NOTE — Progress Notes (Signed)
Subjective:  Patient complains mostly of some SOB. Ongoing early satiety, lack of appetite. No vomiting. REPORTED VAGUE UPPER ABDOMINAL PAIN AT TIME OF ADMISSION NOW GONE.  Objective: Vital signs in last 24 hours: Temp:  [98.2 F (36.8 C)-98.8 F (37.1 C)] 98.2 F (36.8 C) (11/15 0550) Pulse Rate:  [77-112] 105 (11/15 0550) Resp:  [22-36] 27 (11/15 0550) BP: (90-130)/(48-77) 127/71 (11/15 0550) SpO2:  [88 %-97 %] 94 % (11/15 0550) Weight:  [228 lb 6.4 oz (103.6 kg)] 228 lb 6.4 oz (103.6 kg) (11/15 0550) Last BM Date: 01/02/17 General:   Alert,  Well-developed, well-nourished, pleasant and cooperative in NAD Head:  Normocephalic and atraumatic. Eyes:  Sclera clear, no icterus.  Chest: CTA bilaterally without rales, rhonchi, crackles.   Diminished breath sounds in bases. Heart:  Regular rate and rhythm; no murmurs, clicks, rubs,  or gallops. Abdomen:  Soft, nontender and nondistended.  Normal bowel sounds, without guarding, and without rebound.   Extremities:  Without clubbing, deformity or edema. Neurologic:  Alert and  oriented x4;  grossly normal neurologically. Skin:  Intact without significant lesions or rashes. Psych:  Alert and cooperative. Normal mood and affect.  Intake/Output from previous day: No intake/output data recorded. Intake/Output this shift: No intake/output data recorded.  Lab Results: CBC Recent Labs    01/04/17 0400  01/04/17 2127 01/05/17 0336 01/06/17 0448  WBC 8.1  --   --  7.9 8.3  HGB 11.4*   < > 11.0* 10.9*  10.9* 10.7*  HCT 35.0*   < > 33.2* 33.1*  32.7* 31.6*  MCV 96.4  --   --  93.0 92.1  PLT 203  --   --  186 139*   < > = values in this interval not displayed.   BMET Recent Labs    01/04/17 0400 01/05/17 0336 01/06/17 0448  NA 132* 134* 131*  K 6.2* 4.4 4.2  CL 91* 95* 94*  CO2 7* 17* 20*  GLUCOSE 105* 124* 108*  BUN 99* 48* 66*  CREATININE 13.84* 8.00* 10.20*  CALCIUM 7.7* 7.6* 7.5*   LFTs Recent Labs    01/03/17 1706  01/04/17 0400 01/05/17 0336  BILITOT 1.1 1.1  --   ALKPHOS 40 39  --   AST 42* 43*  --   ALT 48 44  --   PROT 6.9 6.5  --   ALBUMIN 2.7* 2.5* 2.7*   No results for input(s): LIPASE in the last 72 hours. PT/INR Recent Labs    01/04/17 0400 01/05/17 0336 01/06/17 0448  LABPROT >90.0* 22.1* 19.1*  INR >10.00* 1.95 1.62      Imaging Studies: Mr Abdomen Mrcp Wo Contrast  Result Date: 01/05/2017 CLINICAL DATA:  Abdominal pain and constipation for 2 weeks. EGD today. GI bleed. Pancreatic adenocarcinoma suspected. History of renal cell carcinoma. EXAM: MRI ABDOMEN WITHOUT CONTRAST  (INCLUDING MRCP) TECHNIQUE: Multiplanar multisequence MR imaging of the abdomen was performed. Heavily T2-weighted images of the biliary and pancreatic ducts were obtained, and three-dimensional MRCP images were rendered by post processing. COMPARISON:  CT of 05/07/2016.  Endoscopy report of earlier today. FINDINGS: Moderate to markedly motion degraded exam throughout. Exam is also degraded by lack of IV contrast. Lower chest: Right hemidiaphragm elevation with probable atelectasis. Moderate cardiomegaly. No pleural fluid. Hepatobiliary: Multiple T2 hyperintense liver lesions are likely cysts. Grossly normal gallbladder, without biliary duct dilatation. Normal common duct caliber, including at 5 mm on image 7/ series 100. Pancreas: Given above limitations, there is no evidence of  pancreatic mass or duct dilatation. There is suggestion of peripancreatic edema, including about the tail on image 27/series 15 and the head/ body on image 34/ series 18. No peripancreatic fluid collection. The pancreatic uncinate process is not well evaluated. Spleen:  Iron deposition within. Adrenals/Urinary Tract: Adrenal glands not well evaluated. Right nephrectomy. Multiple left renal lesions of varying complexity, not well evaluated. Stomach/Bowel: Grossly normal stomach and abdominal bowel loops. Vascular/Lymphatic: Normal caliber of the  abdominal aorta and branch vessels. Retroperitoneal nodes not well evaluated. Other:  No gross ascites. Musculoskeletal: No acute osseous abnormality. IMPRESSION: 1. Moderate to markedly motion degraded exam. 2. Given this limitation, no dominant pancreatic mass is seen. Findings suspicious for non complicated pancreatitis. Electronically Signed   By: Abigail Miyamoto M.D.   On: 01/05/2017 17:04   Mr 3d Recon At Scanner  Result Date: 01/05/2017 CLINICAL DATA:  Abdominal pain and constipation for 2 weeks. EGD today. GI bleed. Pancreatic adenocarcinoma suspected. History of renal cell carcinoma. EXAM: MRI ABDOMEN WITHOUT CONTRAST  (INCLUDING MRCP) TECHNIQUE: Multiplanar multisequence MR imaging of the abdomen was performed. Heavily T2-weighted images of the biliary and pancreatic ducts were obtained, and three-dimensional MRCP images were rendered by post processing. COMPARISON:  CT of 05/07/2016.  Endoscopy report of earlier today. FINDINGS: Moderate to markedly motion degraded exam throughout. Exam is also degraded by lack of IV contrast. Lower chest: Right hemidiaphragm elevation with probable atelectasis. Moderate cardiomegaly. No pleural fluid. Hepatobiliary: Multiple T2 hyperintense liver lesions are likely cysts. Grossly normal gallbladder, without biliary duct dilatation. Normal common duct caliber, including at 5 mm on image 7/ series 100. Pancreas: Given above limitations, there is no evidence of pancreatic mass or duct dilatation. There is suggestion of peripancreatic edema, including about the tail on image 27/series 15 and the head/ body on image 34/ series 18. No peripancreatic fluid collection. The pancreatic uncinate process is not well evaluated. Spleen:  Iron deposition within. Adrenals/Urinary Tract: Adrenal glands not well evaluated. Right nephrectomy. Multiple left renal lesions of varying complexity, not well evaluated. Stomach/Bowel: Grossly normal stomach and abdominal bowel loops.  Vascular/Lymphatic: Normal caliber of the abdominal aorta and branch vessels. Retroperitoneal nodes not well evaluated. Other:  No gross ascites. Musculoskeletal: No acute osseous abnormality. IMPRESSION: 1. Moderate to markedly motion degraded exam. 2. Given this limitation, no dominant pancreatic mass is seen. Findings suspicious for non complicated pancreatitis. Electronically Signed   By: Abigail Miyamoto M.D.   On: 01/05/2017 17:04   Dg Chest Port 1 View  Result Date: 01/04/2017 CLINICAL DATA:  Dyspnea, shortness of breath. History of cardiomyopathy, atrial fibrillation end-stage renal disease with right nephrectomy. Former smoker. EXAM: PORTABLE CHEST 1 VIEW COMPARISON:  Chest x-ray of October 14, 2013. FINDINGS: The images obtained in a lordotic fashion. The right hemidiaphragm is chronically elevated. This limits inflation of the right lung. The left lung is better inflated. The interstitial markings are coarse bilaterally and more conspicuous today. The cardiac silhouette is enlarged. The pulmonary vascularity is more engorged than in the past with some cephalization. There is calcification in the wall of the aortic arch. IMPRESSION: Somewhat limited study due to patient positioning and mild hypoinflation. Mild pulmonary vascular congestion. Stable cardiomegaly. No pleural effusion or pneumonia. Thoracic aortic atherosclerosis. Electronically Signed   By: David  Martinique M.D.   On: 01/04/2017 10:30  [2 weeks]   Assessment: 61 year old male admitted with week long history of nausea/vomiting/diarrhea, VAGUE UPPER ABD PAIN, reported melena, heme positive stool in the ED, coagulopathy with INR  greater than 10.  Hemoglobin has remained relatively stable.  EGD yesterday showed diffuse submucosal gastric petechiae, some multiple small erosions in all extending well into the second portion of the duodenum.  Bulging ampulla of Vater with fresh blood at the orifice.  Prominent extrinsic compression, at least 3-4  cm, on the duodenal wall just distal to the ampulla.  Concerning for underlying malignancy.  MRI without contrast (due to renal failure): Suggestion of peripancreatic edema but given limitations of the exam there was no evidence of pancreatic mass.  Likely liver cyst.  Findings suspicious for non-complicated pancreatitis. Patient presentation not typical of pancreatitis.    Plan: 1. Follow-up gastric biopsies.  Unless gastric biopsy significantly revealing, patient will need an EUS in 6 weeks to evaluate abnormal EGD findings. Discussed with patient who is agreeable.  2. To discuss resuming anticoagulation with Dr. [name].   Laureen Ochs. Bernarda Caffey [facility] [phone] 11/15/20188:58 AM  LOS: 3 days    Addendum: Discussed with Dr. [name].  Benefits of Coumadin probably still outweigh the risk at this point although need to maintain tight therapeutic window.  He is at risk of bleeding and needs to be monitoring for any signs of overt or obscure bleeding, progressive anemia. Will go ahead and advance diet as tolerated.   Laureen Ochs. Bernarda Caffey [facility] [phone] 11/15/201812:05 PM

## 2017-01-06 NOTE — Progress Notes (Signed)
Subjective: Interval History: has no complaint of nausea or vomiting.  Still feels weak.  He denies any difficulty breathing..  Objective: Vital signs in last 24 hours: Temp:  [98.2 F (36.8 C)-98.8 F (37.1 C)] 98.2 F (36.8 C) (11/15 0550) Pulse Rate:  [77-112] 105 (11/15 0550) Resp:  [22-36] 27 (11/15 0550) BP: (90-130)/(48-77) 127/71 (11/15 0550) SpO2:  [88 %-97 %] 97 % (11/15 0842) Weight:  [103.6 kg (228 lb 6.4 oz)] 103.6 kg (228 lb 6.4 oz) (11/15 0550) Weight change: -0.498 kg (-1.6 oz)  Intake/Output from previous day: No intake/output data recorded. Intake/Output this shift: No intake/output data recorded.  General appearance: alert, cooperative and no distress Resp: clear to auscultation bilaterally Cardio: irregularly irregular rhythm Extremities: edema not present  Lab Results: Recent Labs    01/05/17 0336 01/06/17 0448  WBC 7.9 8.3  HGB 10.9*  10.9* 10.7*  HCT 33.1*  32.7* 31.6*  PLT 186 139*   BMET:  Recent Labs    01/05/17 0336 01/06/17 0448  NA 134* 131*  K 4.4 4.2  CL 95* 94*  CO2 17* 20*  GLUCOSE 124* 108*  BUN 48* 66*  CREATININE 8.00* 10.20*  CALCIUM 7.6* 7.5*   No results for input(s): PTH in the last 72 hours. Iron Studies: No results for input(s): IRON, TIBC, TRANSFERRIN, FERRITIN in the last 72 hours.  Studies/Results: Mr Abdomen Mrcp Wo Contrast  Result Date: 01/05/2017 CLINICAL DATA:  Abdominal pain and constipation for 2 weeks. EGD today. GI bleed. Pancreatic adenocarcinoma suspected. History of renal cell carcinoma. EXAM: MRI ABDOMEN WITHOUT CONTRAST  (INCLUDING MRCP) TECHNIQUE: Multiplanar multisequence MR imaging of the abdomen was performed. Heavily T2-weighted images of the biliary and pancreatic ducts were obtained, and three-dimensional MRCP images were rendered by post processing. COMPARISON:  CT of 05/07/2016.  Endoscopy report of earlier today. FINDINGS: Moderate to markedly motion degraded exam throughout. Exam is also  degraded by lack of IV contrast. Lower chest: Right hemidiaphragm elevation with probable atelectasis. Moderate cardiomegaly. No pleural fluid. Hepatobiliary: Multiple T2 hyperintense liver lesions are likely cysts. Grossly normal gallbladder, without biliary duct dilatation. Normal common duct caliber, including at 5 mm on image 7/ series 100. Pancreas: Given above limitations, there is no evidence of pancreatic mass or duct dilatation. There is suggestion of peripancreatic edema, including about the tail on image 27/series 15 and the head/ body on image 34/ series 18. No peripancreatic fluid collection. The pancreatic uncinate process is not well evaluated. Spleen:  Iron deposition within. Adrenals/Urinary Tract: Adrenal glands not well evaluated. Right nephrectomy. Multiple left renal lesions of varying complexity, not well evaluated. Stomach/Bowel: Grossly normal stomach and abdominal bowel loops. Vascular/Lymphatic: Normal caliber of the abdominal aorta and branch vessels. Retroperitoneal nodes not well evaluated. Other:  No gross ascites. Musculoskeletal: No acute osseous abnormality. IMPRESSION: 1. Moderate to markedly motion degraded exam. 2. Given this limitation, no dominant pancreatic mass is seen. Findings suspicious for non complicated pancreatitis. Electronically Signed   By: Abigail Miyamoto M.D.   On: 01/05/2017 17:04   Mr 3d Recon At Scanner  Result Date: 01/05/2017 CLINICAL DATA:  Abdominal pain and constipation for 2 weeks. EGD today. GI bleed. Pancreatic adenocarcinoma suspected. History of renal cell carcinoma. EXAM: MRI ABDOMEN WITHOUT CONTRAST  (INCLUDING MRCP) TECHNIQUE: Multiplanar multisequence MR imaging of the abdomen was performed. Heavily T2-weighted images of the biliary and pancreatic ducts were obtained, and three-dimensional MRCP images were rendered by post processing. COMPARISON:  CT of 05/07/2016.  Endoscopy report of earlier  today. FINDINGS: Moderate to markedly motion degraded  exam throughout. Exam is also degraded by lack of IV contrast. Lower chest: Right hemidiaphragm elevation with probable atelectasis. Moderate cardiomegaly. No pleural fluid. Hepatobiliary: Multiple T2 hyperintense liver lesions are likely cysts. Grossly normal gallbladder, without biliary duct dilatation. Normal common duct caliber, including at 5 mm on image 7/ series 100. Pancreas: Given above limitations, there is no evidence of pancreatic mass or duct dilatation. There is suggestion of peripancreatic edema, including about the tail on image 27/series 15 and the head/ body on image 34/ series 18. No peripancreatic fluid collection. The pancreatic uncinate process is not well evaluated. Spleen:  Iron deposition within. Adrenals/Urinary Tract: Adrenal glands not well evaluated. Right nephrectomy. Multiple left renal lesions of varying complexity, not well evaluated. Stomach/Bowel: Grossly normal stomach and abdominal bowel loops. Vascular/Lymphatic: Normal caliber of the abdominal aorta and branch vessels. Retroperitoneal nodes not well evaluated. Other:  No gross ascites. Musculoskeletal: No acute osseous abnormality. IMPRESSION: 1. Moderate to markedly motion degraded exam. 2. Given this limitation, no dominant pancreatic mass is seen. Findings suspicious for non complicated pancreatitis. Electronically Signed   By: Abigail Miyamoto M.D.   On: 01/05/2017 17:04   Dg Chest Port 1 View  Result Date: 01/04/2017 CLINICAL DATA:  Dyspnea, shortness of breath. History of cardiomyopathy, atrial fibrillation end-stage renal disease with right nephrectomy. Former smoker. EXAM: PORTABLE CHEST 1 VIEW COMPARISON:  Chest x-ray of October 14, 2013. FINDINGS: The images obtained in a lordotic fashion. The right hemidiaphragm is chronically elevated. This limits inflation of the right lung. The left lung is better inflated. The interstitial markings are coarse bilaterally and more conspicuous today. The cardiac silhouette is  enlarged. The pulmonary vascularity is more engorged than in the past with some cephalization. There is calcification in the wall of the aortic arch. IMPRESSION: Somewhat limited study due to patient positioning and mild hypoinflation. Mild pulmonary vascular congestion. Stable cardiomegaly. No pleural effusion or pneumonia. Thoracic aortic atherosclerosis. Electronically Signed   By: David  Martinique M.D.   On: 01/04/2017 10:30    I have reviewed the patient's current medications.  Assessment/Plan: Problem #1 end-stage renal disease: His status post hemodialysis on Tuesday.  His potassium is normal and presently he is asymptomatic. 2] hypertension: His blood pressure is reasonably controlled 3] history of GI bleeding.  Patient has upper endoscopy and was found to have multiple small ulcers.  Presently his hemoglobin remains stable. 4] atrial fibrillation: His heart rate is controlled 5] bone and mineral disorder: His calcium is range but phosphorus is high.  Patient is on a binder 6] history of sleep apnea 7 ]history of diabetes  Plan:1] We will make arrangements for patient to get dialysis today 2] we will use 2K/2.5 calcium bath and remove about 2 L if systolic blood pressure remains above 90.   LOS: 3 days   Terri Malerba S 01/06/2017,9:23 AM

## 2017-01-07 ENCOUNTER — Encounter (HOSPITAL_COMMUNITY): Payer: Self-pay | Admitting: Internal Medicine

## 2017-01-08 DIAGNOSIS — Z992 Dependence on renal dialysis: Secondary | ICD-10-CM | POA: Diagnosis not present

## 2017-01-08 DIAGNOSIS — N186 End stage renal disease: Secondary | ICD-10-CM | POA: Diagnosis not present

## 2017-01-10 ENCOUNTER — Encounter: Payer: Self-pay | Admitting: *Deleted

## 2017-01-10 ENCOUNTER — Encounter: Payer: Self-pay | Admitting: Internal Medicine

## 2017-01-12 ENCOUNTER — Ambulatory Visit (INDEPENDENT_AMBULATORY_CARE_PROVIDER_SITE_OTHER): Payer: Medicare HMO | Admitting: *Deleted

## 2017-01-12 DIAGNOSIS — I4891 Unspecified atrial fibrillation: Secondary | ICD-10-CM | POA: Diagnosis not present

## 2017-01-12 DIAGNOSIS — Z5181 Encounter for therapeutic drug level monitoring: Secondary | ICD-10-CM | POA: Diagnosis not present

## 2017-01-12 LAB — POCT INR: INR: 3.9

## 2017-01-13 DIAGNOSIS — Z992 Dependence on renal dialysis: Secondary | ICD-10-CM | POA: Diagnosis not present

## 2017-01-13 DIAGNOSIS — N186 End stage renal disease: Secondary | ICD-10-CM | POA: Diagnosis not present

## 2017-01-15 DIAGNOSIS — Z992 Dependence on renal dialysis: Secondary | ICD-10-CM | POA: Diagnosis not present

## 2017-01-15 DIAGNOSIS — N186 End stage renal disease: Secondary | ICD-10-CM | POA: Diagnosis not present

## 2017-01-17 ENCOUNTER — Ambulatory Visit (INDEPENDENT_AMBULATORY_CARE_PROVIDER_SITE_OTHER): Payer: Medicare HMO | Admitting: *Deleted

## 2017-01-17 DIAGNOSIS — I4891 Unspecified atrial fibrillation: Secondary | ICD-10-CM | POA: Diagnosis not present

## 2017-01-17 DIAGNOSIS — E663 Overweight: Secondary | ICD-10-CM | POA: Diagnosis not present

## 2017-01-17 DIAGNOSIS — I10 Essential (primary) hypertension: Secondary | ICD-10-CM | POA: Diagnosis not present

## 2017-01-17 DIAGNOSIS — E119 Type 2 diabetes mellitus without complications: Secondary | ICD-10-CM | POA: Diagnosis not present

## 2017-01-17 DIAGNOSIS — C649 Malignant neoplasm of unspecified kidney, except renal pelvis: Secondary | ICD-10-CM | POA: Diagnosis not present

## 2017-01-17 DIAGNOSIS — K921 Melena: Secondary | ICD-10-CM | POA: Diagnosis not present

## 2017-01-17 DIAGNOSIS — T45511A Poisoning by anticoagulants, accidental (unintentional), initial encounter: Secondary | ICD-10-CM | POA: Diagnosis not present

## 2017-01-17 DIAGNOSIS — Z5181 Encounter for therapeutic drug level monitoring: Secondary | ICD-10-CM | POA: Diagnosis not present

## 2017-01-17 DIAGNOSIS — K9289 Other specified diseases of the digestive system: Secondary | ICD-10-CM | POA: Diagnosis not present

## 2017-01-17 DIAGNOSIS — Z6829 Body mass index (BMI) 29.0-29.9, adult: Secondary | ICD-10-CM | POA: Diagnosis not present

## 2017-01-17 DIAGNOSIS — D649 Anemia, unspecified: Secondary | ICD-10-CM | POA: Diagnosis not present

## 2017-01-17 DIAGNOSIS — C641 Malignant neoplasm of right kidney, except renal pelvis: Secondary | ICD-10-CM | POA: Diagnosis not present

## 2017-01-17 DIAGNOSIS — N186 End stage renal disease: Secondary | ICD-10-CM | POA: Diagnosis not present

## 2017-01-17 DIAGNOSIS — Z6830 Body mass index (BMI) 30.0-30.9, adult: Secondary | ICD-10-CM | POA: Diagnosis not present

## 2017-01-17 LAB — POCT INR
INR: 2.3
INR: 2.3

## 2017-01-18 ENCOUNTER — Encounter (HOSPITAL_COMMUNITY): Payer: Self-pay

## 2017-01-18 ENCOUNTER — Emergency Department (HOSPITAL_COMMUNITY)
Admission: EM | Admit: 2017-01-18 | Discharge: 2017-01-18 | Disposition: A | Discharge status: Home / Self Care | Payer: Medicare HMO | Attending: Emergency Medicine | Admitting: Emergency Medicine

## 2017-01-18 DIAGNOSIS — Z7901 Long term (current) use of anticoagulants: Secondary | ICD-10-CM | POA: Insufficient documentation

## 2017-01-18 DIAGNOSIS — Z992 Dependence on renal dialysis: Secondary | ICD-10-CM | POA: Diagnosis not present

## 2017-01-18 DIAGNOSIS — E1165 Type 2 diabetes mellitus with hyperglycemia: Secondary | ICD-10-CM | POA: Diagnosis not present

## 2017-01-18 DIAGNOSIS — I120 Hypertensive chronic kidney disease with stage 5 chronic kidney disease or end stage renal disease: Secondary | ICD-10-CM | POA: Diagnosis not present

## 2017-01-18 DIAGNOSIS — Z7984 Long term (current) use of oral hypoglycemic drugs: Secondary | ICD-10-CM | POA: Diagnosis not present

## 2017-01-18 DIAGNOSIS — R739 Hyperglycemia, unspecified: Secondary | ICD-10-CM

## 2017-01-18 DIAGNOSIS — Z87891 Personal history of nicotine dependence: Secondary | ICD-10-CM | POA: Diagnosis not present

## 2017-01-18 DIAGNOSIS — Z79899 Other long term (current) drug therapy: Secondary | ICD-10-CM | POA: Diagnosis not present

## 2017-01-18 DIAGNOSIS — N186 End stage renal disease: Secondary | ICD-10-CM | POA: Diagnosis not present

## 2017-01-18 LAB — I-STAT CHEM 8, ED
BUN: 29 mg/dL — ABNORMAL HIGH (ref 6–20)
CREATININE: 5.7 mg/dL — AB (ref 0.61–1.24)
Calcium, Ion: 0.95 mmol/L — ABNORMAL LOW (ref 1.15–1.40)
Chloride: 90 mmol/L — ABNORMAL LOW (ref 101–111)
Glucose, Bld: 554 mg/dL (ref 65–99)
HEMATOCRIT: 29 % — AB (ref 39.0–52.0)
HEMOGLOBIN: 9.9 g/dL — AB (ref 13.0–17.0)
POTASSIUM: 3.3 mmol/L — AB (ref 3.5–5.1)
SODIUM: 135 mmol/L (ref 135–145)
TCO2: 28 mmol/L (ref 22–32)

## 2017-01-18 LAB — CBC
HCT: 27.4 % — ABNORMAL LOW (ref 39.0–52.0)
HEMOGLOBIN: 8.8 g/dL — AB (ref 13.0–17.0)
MCH: 30.4 pg (ref 26.0–34.0)
MCHC: 32.1 g/dL (ref 30.0–36.0)
MCV: 94.8 fL (ref 78.0–100.0)
PLATELETS: 172 10*3/uL (ref 150–400)
RBC: 2.89 MIL/uL — AB (ref 4.22–5.81)
RDW: 14.6 % (ref 11.5–15.5)
WBC: 5.1 10*3/uL (ref 4.0–10.5)

## 2017-01-18 LAB — BASIC METABOLIC PANEL
Anion gap: 11 (ref 5–15)
BUN: 29 mg/dL — ABNORMAL HIGH (ref 6–20)
CHLORIDE: 94 mmol/L — AB (ref 101–111)
CO2: 27 mmol/L (ref 22–32)
CREATININE: 6.16 mg/dL — AB (ref 0.61–1.24)
Calcium: 8 mg/dL — ABNORMAL LOW (ref 8.9–10.3)
GFR calc non Af Amer: 9 mL/min — ABNORMAL LOW (ref >60)
GFR, EST AFRICAN AMERICAN: 10 mL/min — AB (ref >60)
Glucose, Bld: 557 mg/dL (ref 65–99)
POTASSIUM: 3.3 mmol/L — AB (ref 3.5–5.1)
Sodium: 132 mmol/L — ABNORMAL LOW (ref 135–145)

## 2017-01-18 LAB — CBG MONITORING, ED
GLUCOSE-CAPILLARY: 535 mg/dL — AB (ref 65–99)
Glucose-Capillary: 495 mg/dL — ABNORMAL HIGH (ref 65–99)

## 2017-01-18 LAB — PROTIME-INR
INR: 2.54
PROTHROMBIN TIME: 27.1 s — AB (ref 11.4–15.2)

## 2017-01-18 MED ORDER — INSULIN GLARGINE 100 UNIT/ML ~~LOC~~ SOLN
30.0000 [IU] | Freq: Every day | SUBCUTANEOUS | Status: DC
  Administered 2017-01-18: 30 [IU] via SUBCUTANEOUS
  Filled 2017-01-18: qty 0.3

## 2017-01-18 MED ORDER — INSULIN ASPART 100 UNIT/ML ~~LOC~~ SOLN
15.0000 [IU] | Freq: Once | SUBCUTANEOUS | Status: AC
  Administered 2017-01-18: 15 [IU] via SUBCUTANEOUS
  Filled 2017-01-18: qty 1

## 2017-01-18 NOTE — Discharge Instructions (Signed)
Follow-up with your doctor tomorrow morning as a walk-in.  He is expecting you

## 2017-01-18 NOTE — ED Provider Notes (Signed)
[facility] EMERGENCY DEPARTMENT Provider Note   CSN: [id number] Arrival date & time: 01/18/17  1257     History   Chief Complaint Chief Complaint  Patient presents with  . GI Bleeding    HPI Albert [name] is a 61 y.o. male.  Patient came to the emergency department because his hemoglobin was low and his sugar was high.  Patient had dialysis today.  He has a history of ulcers.  And is on Coumadin.  He states he is not having black or bloody stools   The history is provided by the patient. No language interpreter was used.  Illness  This is a recurrent problem. The current episode started 2 days ago. The problem occurs constantly. The problem has not changed since onset.Pertinent negatives include no chest pain, no abdominal pain and no headaches. Nothing aggravates the symptoms. Nothing relieves the symptoms.    Past Medical History:  Diagnosis Date  . Anemia   . Atrial fibrillation ([place])   . Cardiomyopathy    LVEF 50-55% 8/11 - [facility]  . Colonic polyp   . Diverticulosis of colon   . ESRD on hemodialysis ([place])    Dr. Lowanda Foster [facility]  . Essential hypertension, benign   . Gout   . Mixed hyperlipidemia   . OSA (obstructive sleep apnea)    CPAP  . Renal cell cancer ([place])   . Type 2 diabetes mellitus ([place])    Type II    Patient Active Problem List   Diagnosis Date Noted  . Gastritis and gastroduodenitis   . Abnormal findings on esophagogastroduodenoscopy (EGD)   . GI bleed 01/05/2017  . Heme positive stool   . Gastrointestinal hemorrhage   . Supratherapeutic INR   . Coagulopathy ([place]) 01/03/2017  . Anemia 01/03/2017  . Constipation 12/17/2015  . Rectal bleeding 12/17/2015  . History of colonic polyps 12/17/2015  . Visit for suture removal 12/06/2013  . Bradycardia 10/12/2013  . Metabolic acidosis 67/20/9470  . Acute respiratory failure with hypercapnia ([place]) 10/12/2013  . Encounter for therapeutic drug monitoring 03/29/2013  . Atrial fibrillation ([place])  03/10/2011  . Long term (current) use of anticoagulants 03/10/2011  . Secondary cardiomyopathy, unspecified 03/10/2011  . ESRD (end stage renal disease) ([place]) 03/10/2011  . HYPERLIPIDEMIA 08/24/2007  . PRIMARY CENTRAL SLEEP APNEA 08/24/2007  . OBSTRUCTIVE SLEEP APNEA 08/24/2007  . Essential hypertension, benign 08/24/2007    Past Surgical History:  Procedure Laterality Date  . BIOPSY  01/05/2017   Procedure: BIOPSY;  Surgeon: Daneil Dolin, MD;  [facility];  Service: Endoscopy;;  gastric  . COLONOSCOPY N/A 01/07/2016   Dr. [name]: Grade 1 internal hemorrhoids, otherwise normal  . COLONOSCOPY W/ POLYPECTOMY  2009, 2012   2009: Dr. [name]: 1.25 cm adenomatous polyp without high grade dysplasia, distal to IC valve. 2012: normal rectum, few pancolonic diverticula, single diminutive hyperplastic polyp  . ESOPHAGOGASTRODUODENOSCOPY  2009   Dr. [name]: accentuated undulating Z-lin with couple of islands of salmon-colored epithelium suspicious for Barrett's, s/p biopsy with path revealing mild inflammation, no metaplasia, dysplasia, or malignancy  . ESOPHAGOGASTRODUODENOSCOPY N/A 01/05/2017   Procedure: ESOPHAGOGASTRODUODENOSCOPY (EGD);  Surgeon: Daneil Dolin, MD;  [facility];  Service: Endoscopy;  Laterality: N/A;  . FISTULA SUPERFICIALIZATION Left 03/31/2015   Procedure: CEPHALIC VEIN TURNDOWN; PLICATION OF BRACHIOCEPHALIC AVF;  Surgeon: Conrad Zinc, MD;  Location: [place];  Service: Vascular;  Laterality: Left;  . FISTULOGRAM N/A 10/19/2013   Procedure: FISTULOGRAM;  Surgeon: Rosetta Posner,  MD;  Location: [facility];  Service: Cardiovascular;  Laterality: N/A;  . Left arm AV fistula  2009   Dr. [name]  . PERIPHERAL VASCULAR CATHETERIZATION N/A 03/10/2015   Procedure: Fistulagram;  Surgeon: Conrad Lubeck, MD;  Location: [facility];  Service: Cardiovascular;  Laterality: N/A;  . Right nephrectomy         Home Medications    Prior to Admission medications    Medication Sig Start Date End Date Taking? Authorizing Provider  allopurinol (ZYLOPRIM) 100 MG tablet Take 100 mg by mouth daily.    Yes [provider]  carvedilol (COREG) 25 MG tablet take 1/2 tablet two times daily 01/06/17  Yes Rizwan, Eunice Blase, MD  cinacalcet (SENSIPAR) 30 MG tablet Take 30 mg by mouth at bedtime.    Yes [provider]  diltiazem (CARDIZEM CD) 240 MG 24 hr capsule Take 1 capsule (240 mg total) daily by mouth. Patient taking differently: Take 120 mg by mouth daily.  01/07/17  Yes Debbe Odea, MD  docusate sodium (COLACE) 100 MG capsule Take 100 mg by mouth daily.   Yes [provider]  fenofibrate 160 MG tablet Take 160 mg every evening by mouth.    Yes [provider]  fish oil-omega-3 fatty acids 1000 MG capsule Take 1 g by mouth 3 (three) times daily.    Yes [provider]  linagliptin (TRADJENTA) 5 MG TABS tablet Take 5 mg by mouth daily.   Yes [provider]  pantoprazole (PROTONIX) 40 MG tablet Take 1 tablet (40 mg total) 2 (two) times daily before a meal by mouth. 01/06/17  Yes Rizwan, Saima, MD  pravastatin (PRAVACHOL) 10 MG tablet Take 5 mg by mouth every evening.    Yes [provider]  sevelamer carbonate (RENVELA) 800 MG tablet Take 1,600-3,200 mg See admin instructions by mouth. Take 4 tablets after each meal and 2 after each snack   Yes [provider]  Tetrahydrozoline HCl (VISINE OP) Place 2 drops into both eyes daily as needed (dry eyes).   Yes [provider]  warfarin (COUMADIN) 2.5 MG tablet Take 1 tablet daily except 1 1/2 tablets on Sundays, Tuesdays and Thursdays or as directed Patient taking differently: Take 2.5 mg by mouth See admin instructions. TAKE ONE TABLET ON MONDAYS, TUESDAY,WEDNESDAY,FRIDAY,SATURDAY. TAKE HALF TABLET ON THURSDAY AND SUNDAY 02/19/16  Yes [name], MD    Family History Family History  Problem Relation Age of Onset  . Hypertension  Mother   . Hypertension Unknown        Siblings  . Diabetes type II Unknown        Siblings  . Colon cancer Neg Hx     Social History Social History   Tobacco Use  . Smoking status: Former Smoker    Types: Cigarettes    Last attempt to quit: 07/04/1998    Years since quitting: 18.5  . Smokeless tobacco: Never Used  . Tobacco comment: Quit May 2000  Substance Use Topics  . Alcohol use: No    Alcohol/week: 0.0 oz    Comment: Quit 2014; Previously drank about 3 drinks a day  . Drug use: No     Allergies   Metformin and related   Review of Systems Review of Systems  Constitutional: Negative for appetite change and fatigue.  HENT: Negative for congestion, ear discharge and sinus pressure.   Eyes: Negative for discharge.  Respiratory: Negative for cough.   Cardiovascular: Negative for chest  pain.  Gastrointestinal: Negative for abdominal pain and diarrhea.  Genitourinary: Negative for frequency and hematuria.  Musculoskeletal: Negative for back pain.  Skin: Negative for rash.  Neurological: Negative for seizures and headaches.  Psychiatric/Behavioral: Negative for hallucinations.     Physical Exam Updated Vital Signs BP 117/75   Pulse (!) 113   Temp 97.7 F (36.5 C) (Oral)   Resp (!) 21   Ht 6\' 2"  (1.88 m)   Wt 103.4 kg (228 lb)   SpO2 100%   BMI 29.27 kg/m   Physical Exam  Constitutional: He is oriented to person, place, and time. He appears well-developed.  HENT:  Head: Normocephalic.  Eyes: Conjunctivae and EOM are normal. No scleral icterus.  Neck: Neck supple. No thyromegaly present.  Cardiovascular: Exam reveals no gallop and no friction rub.  No murmur heard. Tachycardia  Pulmonary/Chest: No stridor. He has no wheezes. He has no rales. He exhibits no tenderness.  Abdominal: He exhibits no distension. There is no tenderness. There is no rebound.  Genitourinary:  Genitourinary Comments: Rectal exam normal minimal stool that was heme-negative    Musculoskeletal: Normal range of motion. He exhibits no edema.  Lymphadenopathy:    He has no cervical adenopathy.  Neurological: He is oriented to person, place, and time. He exhibits normal muscle tone. Coordination normal.  Skin: No rash noted. No erythema.  Psychiatric: He has a normal mood and affect. His behavior is normal.     ED Treatments / Results  Labs (all labs ordered are listed, but only abnormal results are displayed) Labs Reviewed  BASIC METABOLIC PANEL - Abnormal; Notable for the following components:      Result Value   Sodium 132 (*)    Potassium 3.3 (*)    Chloride 94 (*)    Glucose, Bld 557 (*)    BUN 29 (*)    Creatinine, Ser 6.16 (*)    Calcium 8.0 (*)    GFR calc non Af Amer 9 (*)    GFR calc Af Amer 10 (*)    All other components within normal limits  CBC - Abnormal; Notable for the following components:   RBC 2.89 (*)    Hemoglobin 8.8 (*)    HCT 27.4 (*)    All other components within normal limits  PROTIME-INR - Abnormal; Notable for the following components:   Prothrombin Time 27.1 (*)    All other components within normal limits  CBG MONITORING, ED - Abnormal; Notable for the following components:   Glucose-Capillary 535 (*)    All other components within normal limits  I-STAT CHEM 8, ED - Abnormal; Notable for the following components:   Potassium 3.3 (*)    Chloride 90 (*)    BUN 29 (*)    Creatinine, Ser 5.70 (*)    Glucose, Bld 554 (*)    Calcium, Ion 0.95 (*)    Hemoglobin 9.9 (*)    HCT 29.0 (*)    All other components within normal limits  CBG MONITORING, ED - Abnormal; Notable for the following components:   Glucose-Capillary 495 (*)    All other components within normal limits    EKG  EKG Interpretation  Date/Time:  Tuesday January 18 2017 13:25:10 EST Ventricular Rate:  117 PR Interval:    QRS Duration: 98 QT Interval:  415 QTC Calculation: 577 R Axis:   -14 Text Interpretation:  Atrial fibrillation Low voltage,  extremity and precordial leads Nonspecific T abnormalities, diffuse leads Prolonged QT interval Confirmed  by Milton Ferguson [phone]) on 01/18/2017 3:36:35 PM       Radiology No results found.  Procedures Procedures (including critical care time)  Medications Ordered in ED Medications  insulin glargine (LANTUS) injection 30 Units (not administered)  insulin aspart (novoLOG) injection 15 Units (15 Units Subcutaneous Given 01/18/17 1424)     Initial Impression / Assessment and Plan / ED Course  I have reviewed the triage vital signs and the nursing notes.  Pertinent labs & imaging results that were available during my care of the patient were reviewed by me and considered in my medical decision making (see chart for details).     Patient is anemic with hemoglobin 8.8 this is gone down from 10.8.  His glucose has been around 500.  I spoke with his primary care doctor and he is going to see him tomorrow he instructed [place] to give the patient 30 units of Lantus today and he will see him tomorrow make arrangements for him to get put on insulin  Final Clinical Impressions(s) / ED Diagnoses   Final diagnoses:  Hyperglycemia    ED Discharge Orders    None       Milton Ferguson, MD 01/18/17 1616

## 2017-01-18 NOTE — ED Triage Notes (Signed)
Pt reports he saw Dr. Gerarda Fraction yesterday to follow up after getting out of the hospital.  Reports he has blood work done and was told he is anemic and his blood sugar is high.  Pt says when he was admitted, he was having bloody stools but hasn't had any black or bloody stools since being discharged from the hospital.

## 2017-01-18 NOTE — ED Notes (Signed)
CRITICAL VALUE ALERT  Critical Value:  Glucose 557  Date & Time Notied:  1405  Provider Notified:  Dr Roderic Palau

## 2017-01-18 NOTE — ED Notes (Signed)
POC CBG 495

## 2017-01-19 DIAGNOSIS — E782 Mixed hyperlipidemia: Secondary | ICD-10-CM | POA: Diagnosis not present

## 2017-01-19 DIAGNOSIS — I10 Essential (primary) hypertension: Secondary | ICD-10-CM | POA: Diagnosis not present

## 2017-01-19 DIAGNOSIS — C641 Malignant neoplasm of right kidney, except renal pelvis: Secondary | ICD-10-CM | POA: Diagnosis not present

## 2017-01-19 DIAGNOSIS — R739 Hyperglycemia, unspecified: Secondary | ICD-10-CM | POA: Diagnosis not present

## 2017-01-19 DIAGNOSIS — E1129 Type 2 diabetes mellitus with other diabetic kidney complication: Secondary | ICD-10-CM | POA: Diagnosis not present

## 2017-01-19 DIAGNOSIS — Z992 Dependence on renal dialysis: Secondary | ICD-10-CM | POA: Diagnosis not present

## 2017-01-19 DIAGNOSIS — E6609 Other obesity due to excess calories: Secondary | ICD-10-CM | POA: Diagnosis not present

## 2017-01-19 DIAGNOSIS — Z6830 Body mass index (BMI) 30.0-30.9, adult: Secondary | ICD-10-CM | POA: Diagnosis not present

## 2017-01-19 DIAGNOSIS — I482 Chronic atrial fibrillation: Secondary | ICD-10-CM | POA: Diagnosis not present

## 2017-01-20 DIAGNOSIS — N186 End stage renal disease: Secondary | ICD-10-CM | POA: Diagnosis not present

## 2017-01-20 DIAGNOSIS — Z992 Dependence on renal dialysis: Secondary | ICD-10-CM | POA: Diagnosis not present

## 2017-01-21 ENCOUNTER — Observation Stay (HOSPITAL_COMMUNITY)
Admission: EM | Admit: 2017-01-21 | Discharge: 2017-01-22 | Disposition: A | Discharge status: Home / Self Care | Payer: Medicare HMO | Attending: Family Medicine | Admitting: Family Medicine

## 2017-01-21 ENCOUNTER — Encounter (HOSPITAL_COMMUNITY): Payer: Self-pay | Admitting: Emergency Medicine

## 2017-01-21 ENCOUNTER — Other Ambulatory Visit: Payer: Self-pay

## 2017-01-21 DIAGNOSIS — Z87891 Personal history of nicotine dependence: Secondary | ICD-10-CM | POA: Insufficient documentation

## 2017-01-21 DIAGNOSIS — Z85528 Personal history of other malignant neoplasm of kidney: Secondary | ICD-10-CM | POA: Insufficient documentation

## 2017-01-21 DIAGNOSIS — Z79899 Other long term (current) drug therapy: Secondary | ICD-10-CM | POA: Diagnosis not present

## 2017-01-21 DIAGNOSIS — Z992 Dependence on renal dialysis: Secondary | ICD-10-CM

## 2017-01-21 DIAGNOSIS — Z794 Long term (current) use of insulin: Secondary | ICD-10-CM | POA: Diagnosis not present

## 2017-01-21 DIAGNOSIS — I4891 Unspecified atrial fibrillation: Secondary | ICD-10-CM

## 2017-01-21 DIAGNOSIS — G4733 Obstructive sleep apnea (adult) (pediatric): Secondary | ICD-10-CM | POA: Diagnosis present

## 2017-01-21 DIAGNOSIS — D631 Anemia in chronic kidney disease: Secondary | ICD-10-CM | POA: Diagnosis not present

## 2017-01-21 DIAGNOSIS — N186 End stage renal disease: Secondary | ICD-10-CM | POA: Diagnosis not present

## 2017-01-21 DIAGNOSIS — I10 Essential (primary) hypertension: Secondary | ICD-10-CM

## 2017-01-21 DIAGNOSIS — E1165 Type 2 diabetes mellitus with hyperglycemia: Principal | ICD-10-CM | POA: Insufficient documentation

## 2017-01-21 DIAGNOSIS — R791 Abnormal coagulation profile: Secondary | ICD-10-CM | POA: Diagnosis not present

## 2017-01-21 DIAGNOSIS — Z7901 Long term (current) use of anticoagulants: Secondary | ICD-10-CM | POA: Diagnosis not present

## 2017-01-21 DIAGNOSIS — I120 Hypertensive chronic kidney disease with stage 5 chronic kidney disease or end stage renal disease: Secondary | ICD-10-CM | POA: Diagnosis not present

## 2017-01-21 DIAGNOSIS — Z8719 Personal history of other diseases of the digestive system: Secondary | ICD-10-CM | POA: Diagnosis not present

## 2017-01-21 DIAGNOSIS — E782 Mixed hyperlipidemia: Secondary | ICD-10-CM | POA: Insufficient documentation

## 2017-01-21 DIAGNOSIS — E119 Type 2 diabetes mellitus without complications: Secondary | ICD-10-CM | POA: Diagnosis not present

## 2017-01-21 DIAGNOSIS — M109 Gout, unspecified: Secondary | ICD-10-CM | POA: Insufficient documentation

## 2017-01-21 DIAGNOSIS — E1122 Type 2 diabetes mellitus with diabetic chronic kidney disease: Secondary | ICD-10-CM | POA: Diagnosis not present

## 2017-01-21 DIAGNOSIS — R739 Hyperglycemia, unspecified: Secondary | ICD-10-CM

## 2017-01-21 DIAGNOSIS — D649 Anemia, unspecified: Secondary | ICD-10-CM

## 2017-01-21 LAB — GLUCOSE, CAPILLARY
GLUCOSE-CAPILLARY: 104 mg/dL — AB (ref 65–99)
GLUCOSE-CAPILLARY: 141 mg/dL — AB (ref 65–99)
GLUCOSE-CAPILLARY: 154 mg/dL — AB (ref 65–99)
GLUCOSE-CAPILLARY: 165 mg/dL — AB (ref 65–99)
GLUCOSE-CAPILLARY: 301 mg/dL — AB (ref 65–99)
Glucose-Capillary: 217 mg/dL — ABNORMAL HIGH (ref 65–99)
Glucose-Capillary: 154 mg/dL — ABNORMAL HIGH (ref 65–99)

## 2017-01-21 LAB — BASIC METABOLIC PANEL
ANION GAP: 11 (ref 5–15)
Anion gap: 11 (ref 5–15)
Anion gap: 10 (ref 5–15)
BUN: 38 mg/dL — AB (ref 6–20)
BUN: 41 mg/dL — AB (ref 6–20)
BUN: 41 mg/dL — AB (ref 6–20)
CALCIUM: 7.9 mg/dL — AB (ref 8.9–10.3)
CHLORIDE: 95 mmol/L — AB (ref 101–111)
CHLORIDE: 98 mmol/L — AB (ref 101–111)
CO2: 26 mmol/L (ref 22–32)
CO2: 26 mmol/L (ref 22–32)
CO2: 25 mmol/L (ref 22–32)
CREATININE: 7.73 mg/dL — AB (ref 0.61–1.24)
Calcium: 8.2 mg/dL — ABNORMAL LOW (ref 8.9–10.3)
Calcium: 8.1 mg/dL — ABNORMAL LOW (ref 8.9–10.3)
Chloride: 90 mmol/L — ABNORMAL LOW (ref 101–111)
Creatinine, Ser: 8.05 mg/dL — ABNORMAL HIGH (ref 0.61–1.24)
Creatinine, Ser: 8.32 mg/dL — ABNORMAL HIGH (ref 0.61–1.24)
GFR calc Af Amer: 8 mL/min — ABNORMAL LOW (ref >60)
GFR calc Af Amer: 7 mL/min — ABNORMAL LOW (ref >60)
GFR calc Af Amer: 7 mL/min — ABNORMAL LOW (ref >60)
GFR calc non Af Amer: 6 mL/min — ABNORMAL LOW (ref >60)
GFR calc non Af Amer: 6 mL/min — ABNORMAL LOW (ref >60)
GFR, EST NON AFRICAN AMERICAN: 7 mL/min — AB (ref >60)
GLUCOSE: 741 mg/dL — AB (ref 65–99)
GLUCOSE: 198 mg/dL — AB (ref 65–99)
GLUCOSE: 161 mg/dL — AB (ref 65–99)
POTASSIUM: 3 mmol/L — AB (ref 3.5–5.1)
POTASSIUM: 3.2 mmol/L — AB (ref 3.5–5.1)
Potassium: 3.7 mmol/L (ref 3.5–5.1)
Sodium: 127 mmol/L — ABNORMAL LOW (ref 135–145)
Sodium: 132 mmol/L — ABNORMAL LOW (ref 135–145)
Sodium: 133 mmol/L — ABNORMAL LOW (ref 135–145)

## 2017-01-21 LAB — CBC
HCT: 25.6 % — ABNORMAL LOW (ref 39.0–52.0)
HEMOGLOBIN: 7.8 g/dL — AB (ref 13.0–17.0)
MCH: 29.7 pg (ref 26.0–34.0)
MCHC: 30.5 g/dL (ref 30.0–36.0)
MCV: 97.3 fL (ref 78.0–100.0)
PLATELETS: 131 10*3/uL — AB (ref 150–400)
RBC: 2.63 MIL/uL — ABNORMAL LOW (ref 4.22–5.81)
RDW: 14.6 % (ref 11.5–15.5)
WBC: 3.5 10*3/uL — ABNORMAL LOW (ref 4.0–10.5)

## 2017-01-21 LAB — PROTIME-INR
INR: 2.86
PROTHROMBIN TIME: 29.8 s — AB (ref 11.4–15.2)

## 2017-01-21 LAB — CBG MONITORING, ED
Glucose-Capillary: 580 mg/dL (ref 65–99)
Glucose-Capillary: 416 mg/dL — ABNORMAL HIGH (ref 65–99)

## 2017-01-21 LAB — POC OCCULT BLOOD, ED: FECAL OCCULT BLD: NEGATIVE

## 2017-01-21 LAB — HEMOGLOBIN AND HEMATOCRIT, BLOOD
HCT: 26.1 % — ABNORMAL LOW (ref 39.0–52.0)
Hemoglobin: 8.2 g/dL — ABNORMAL LOW (ref 13.0–17.0)

## 2017-01-21 LAB — MRSA PCR SCREENING: MRSA by PCR: NEGATIVE

## 2017-01-21 MED ORDER — ACETAMINOPHEN 650 MG RE SUPP
650.0000 mg | Freq: Four times a day (QID) | RECTAL | Status: DC | PRN
Start: 2017-01-21 — End: 2017-01-22

## 2017-01-21 MED ORDER — ONDANSETRON HCL 4 MG/2ML IJ SOLN: 4.0000 mg | Freq: Four times a day (QID) | INTRAMUSCULAR | Status: DC | PRN

## 2017-01-21 MED ORDER — SODIUM CHLORIDE 0.9 % IV BOLUS (SEPSIS)
250.0000 mL | Freq: Once | INTRAVENOUS | Status: AC
  Administered 2017-01-21: 250 mL via INTRAVENOUS

## 2017-01-21 MED ORDER — PANTOPRAZOLE SODIUM 40 MG PO TBEC
40.0000 mg | DELAYED_RELEASE_TABLET | Freq: Two times a day (BID) | ORAL | Status: DC
  Administered 2017-01-21 – 2017-01-22 (×2): 40 mg via ORAL
  Filled 2017-01-21 (×2): qty 1

## 2017-01-21 MED ORDER — SODIUM CHLORIDE 0.9 % IV SOLN
INTRAVENOUS | Status: DC
  Administered 2017-01-21: 5.4 [IU]/h via INTRAVENOUS
  Filled 2017-01-21 (×2): qty 1

## 2017-01-21 MED ORDER — CINACALCET HCL 30 MG PO TABS
30.0000 mg | ORAL_TABLET | Freq: Every day | ORAL | Status: DC
  Administered 2017-01-21: 30 mg via ORAL
  Filled 2017-01-21 (×3): qty 1

## 2017-01-21 MED ORDER — POLYETHYLENE GLYCOL 3350 17 G PO PACK: 17.0000 g | PACK | Freq: Every day | ORAL | Status: DC | PRN

## 2017-01-21 MED ORDER — ONDANSETRON HCL 4 MG PO TABS: 4.0000 mg | ORAL_TABLET | Freq: Four times a day (QID) | ORAL | Status: DC | PRN

## 2017-01-21 MED ORDER — POTASSIUM CHLORIDE 10 MEQ/100ML IV SOLN
10.0000 meq | Freq: Once | INTRAVENOUS | Status: AC
  Administered 2017-01-21: 10 meq via INTRAVENOUS
  Filled 2017-01-21: qty 100

## 2017-01-21 MED ORDER — ACETAMINOPHEN 325 MG PO TABS: 650.0000 mg | ORAL_TABLET | Freq: Four times a day (QID) | ORAL | Status: DC | PRN

## 2017-01-21 MED ORDER — DOCUSATE SODIUM 100 MG PO CAPS
100.0000 mg | ORAL_CAPSULE | Freq: Every day | ORAL | Status: DC
  Administered 2017-01-21 – 2017-01-22 (×2): 100 mg via ORAL
  Filled 2017-01-21 (×2): qty 1

## 2017-01-21 MED ORDER — ALLOPURINOL 100 MG PO TABS
100.0000 mg | ORAL_TABLET | Freq: Every day | ORAL | Status: DC
  Administered 2017-01-22: 100 mg via ORAL
  Filled 2017-01-21 (×2): qty 1

## 2017-01-21 MED ORDER — FENOFIBRATE 160 MG PO TABS
160.0000 mg | ORAL_TABLET | Freq: Every evening | ORAL | Status: DC
  Administered 2017-01-21: 160 mg via ORAL
  Filled 2017-01-21 (×3): qty 1

## 2017-01-21 MED ORDER — SEVELAMER CARBONATE 800 MG PO TABS
3200.0000 mg | ORAL_TABLET | Freq: Three times a day (TID) | ORAL | Status: DC
  Administered 2017-01-22 (×2): 3200 mg via ORAL
  Filled 2017-01-21 (×2): qty 4

## 2017-01-21 MED ORDER — WARFARIN - PHARMACIST DOSING INPATIENT: Status: DC

## 2017-01-21 MED ORDER — PRAVASTATIN SODIUM 10 MG PO TABS
5.0000 mg | ORAL_TABLET | Freq: Every evening | ORAL | Status: DC
  Administered 2017-01-21: 5 mg via ORAL
  Filled 2017-01-21: qty 1

## 2017-01-21 MED ORDER — DILTIAZEM HCL ER COATED BEADS 120 MG PO CP24
120.0000 mg | ORAL_CAPSULE | Freq: Every day | ORAL | Status: DC
  Administered 2017-01-22: 120 mg via ORAL
  Filled 2017-01-21 (×2): qty 1

## 2017-01-21 MED ORDER — CARVEDILOL 12.5 MG PO TABS
12.5000 mg | ORAL_TABLET | Freq: Two times a day (BID) | ORAL | Status: DC
  Administered 2017-01-21 – 2017-01-22 (×2): 12.5 mg via ORAL
  Filled 2017-01-21 (×2): qty 1

## 2017-01-21 MED ORDER — EPOETIN ALFA 10000 UNIT/ML IJ SOLN
8000.0000 [IU] | INTRAMUSCULAR | Status: DC
  Filled 2017-01-21: qty 1

## 2017-01-21 MED ORDER — WARFARIN SODIUM 2.5 MG PO TABS
2.5000 mg | ORAL_TABLET | Freq: Once | ORAL | Status: AC
Start: 2017-01-21 — End: 2017-01-21
  Administered 2017-01-21: 2.5 mg via ORAL
  Filled 2017-01-21: qty 1

## 2017-01-21 MED ORDER — SEVELAMER CARBONATE 800 MG PO TABS: 1600.0000 mg | ORAL_TABLET | Freq: Two times a day (BID) | ORAL | Status: DC

## 2017-01-21 NOTE — ED Triage Notes (Signed)
Patient c/o hyperglycemia. Per patient used to use insulin and glipizide but was taken off of it 5 years ago due to hypoglycemia and but on tradjenta which has worked well until recently. Patient seen by PCP and given metformin which caused severe GI upset and diarrhea. Patient taken off of that and placed on glipizide and tradjenta. Patient states past couple weeks blood sugars in the 400s. Patient states woke this morning dizzy, checked blood pressure-meter read high so he took x2 glipizide and 1 tradjenta at 8:30. Patient states just rechecked blood sugar prior to coming to ER and meter still read high. Patient reports drowsiness and dizziness (room spinning).

## 2017-01-21 NOTE — Progress Notes (Signed)
Inpatient Diabetes Program Recommendations  AACE/ADA: New Consensus Statement on Inpatient Glycemic Control (2015)  Target Ranges:  Prepandial:   less than 140 mg/dL      Peak postprandial:   less than 180 mg/dL (1-2 hours)      Critically ill patients:  140 - 180 mg/dL   Results for Albert [name], Albert [name] (MRN [id number]) as of 01/21/2017 14:21  Ref. Range 01/18/2017 13:26 01/18/2017 13:46 01/21/2017 10:14  Glucose Latest Ref Range: 65 - 99 mg/dL 557 (HH) 554 (HH) [facility])   Results for Albert [name], Albert [name] (MRN [id number]) as of 01/21/2017 14:21  Ref. Range 01/05/2017 07:28 01/05/2017 12:34 01/05/2017 14:21 01/05/2017 17:45 01/05/2017 22:23 01/06/2017 07:58 01/06/2017 12:03  Glucose-Capillary Latest Ref Range: 65 - 99 mg/dL 128 (H) 97 108 (H) 104 (H) 116 (H) 93 109 (H)   Review of Glycemic Control  Diabetes history: DM2 Outpatient Diabetes medications: Tradjenta 5 mg daily, Glipizide XL 5 mg daily (patient just restarted on Glipizide yesterday) Current orders for Inpatient glycemic control: IV insulin drip  Inpatient Diabetes Program Recommendations: Insulin - IV drip/GlucoStabilizer: Noted patient has been started on IV insulin drip and is currently still in the Emergency Room. HgbA1C: If A1C is checked, it will likely not be accurate due to low hemoglobin and renal insufficiency.  NOTE: Spoke with patient over the phone and patient states that he use to be on Lantus insulin in the past. Patient reports that he use to be on the Lantus and Glipizide and his glucose improved and the Lantus was stopped then he was put on Tradjenta. Patient was having hypoglycemia with Glipizide and Tradjenta so he was told to stop taking Glipizide. Patient reports that his PCP put him on Metformin but he was not able to tolerate Metformin due to GI side effects. Noted patient was in the hospital from 01/03/17 to 01/06/17 and patient was ordered Novolog 0-9 units TID with meals and no other DM medications during  hospitalization. During that time, glucose trended very well with little to no Novolog correction. Patient presented to the Emergency Room on 01/18/17 with glucose noted to be 557 mg/dl and patient was given Lantus 30 units and told to see his PCP the following day. Patient seen PCP yesterday and was told to restart on Glipizide which he took yesterday.  Inquired about any recent steroids (oral or injections) and patient denies any recent steroids. Unclear why glucose has became so elevated recently. Informed patient of protocol with GlucoStabilizer and that he would be transitioned to SQ insulin once glucose is within target range for several consecutive hours and MD is ready to transition off IV insulin drip. Patient verbalized understanding of information discussed and he states that he has no questions at this time.   Thanks, Barnie Alderman, RN, MSN, CDE Diabetes Coordinator Inpatient Diabetes Program [phone] (Team Pager from 8am to 5pm)

## 2017-01-21 NOTE — ED Notes (Signed)
Pt was questioned on if he could urinate, Pt states he can not urinate anymore.

## 2017-01-21 NOTE — ED Notes (Signed)
Sister, Rosalyn Gess would like to be called when pt is discharged. [phone]

## 2017-01-21 NOTE — ED Notes (Signed)
hospitalist in to see pt.

## 2017-01-21 NOTE — ED Notes (Signed)
CRITICAL VALUE ALERT  Critical Value:  Glucose 757  Date & Time Notied:  01/21/17  Provider Notified: dr Reather Converse  Orders Received/Actions taken:

## 2017-01-21 NOTE — Progress Notes (Signed)
ANTICOAGULATION CONSULT NOTE - Initial Consult  Pharmacy Consult for Coumadin (home med) Indication: atrial fibrillation  Allergies  Allergen Reactions  . Metformin And Related Other (See Comments)    Diarrhea, bloody stool, upset stomach.     Patient Measurements: Height: 6\' 2"  (188 cm) Weight: 225 lb (102.1 kg) IBW/kg (Calculated) : 82.2  Vital Signs: Temp: 97.8 F (36.6 C) (11/30 1012) Temp Source: Oral (11/30 1012) BP: 144/85 (11/30 1600) Pulse Rate: 112 (11/30 1600)  Labs: Recent Labs    01/21/17 1014  HGB 7.8*  HCT 25.6*  PLT 131*  LABPROT 29.8*  INR 2.86  CREATININE 7.73*    Estimated Creatinine Clearance: 12.5 mL/min (A) (by C-G formula based on SCr of 7.73 mg/dL (H)).   Medical History: Past Medical History:  Diagnosis Date  . Anemia   . Atrial fibrillation ([place])   . Cardiomyopathy    LVEF 50-55% 8/11 - [facility]  . Colonic polyp   . Diverticulosis of colon   . ESRD on hemodialysis ([place])    Dr. Lowanda Foster T[facility]SAt  . Essential hypertension, benign   . Gout   . Mixed hyperlipidemia   . OSA (obstructive sleep apnea)    CPAP  . Renal cell cancer ([place])   . Type 2 diabetes mellitus (HCC)    Type II    Medications:  Medications Prior to Admission  Medication Sig Dispense Refill Last Dose  . allopurinol (ZYLOPRIM) 100 MG tablet Take 100 mg by mouth daily.    01/21/2017 at Unknown time  . CARTIA XT 120 MG 24 hr capsule Take 1 capsule by mouth daily.   01/21/2017 at Unknown time  . carvedilol (COREG) 25 MG tablet take 1/2 tablet two times daily (Patient taking differently: Take 12.5 mg by mouth 2 (two) times daily with a meal. take 1/2 tablet two times daily) 180 tablet 3 01/21/2017 at Unknown time  . cinacalcet (SENSIPAR) 30 MG tablet Take 30 mg by mouth at bedtime.    01/20/2017 at Unknown time  . docusate sodium (COLACE) 100 MG capsule Take 100 mg by mouth daily.   01/21/2017 at Unknown time  . fenofibrate 160 MG tablet Take 160 mg every evening by mouth.     01/20/2017 at Unknown time  . fish oil-omega-3 fatty acids 1000 MG capsule Take 1 g by mouth 3 (three) times daily.    01/21/2017 at Unknown time  . GLIPIZIDE XL 5 MG 24 hr tablet Take 1 tablet by mouth daily.   01/21/2017 at Unknown time  . linagliptin (TRADJENTA) 5 MG TABS tablet Take 5 mg by mouth daily.   01/21/2017 at Unknown time  . pantoprazole (PROTONIX) 40 MG tablet Take 1 tablet (40 mg total) 2 (two) times daily before a meal by mouth. 60 tablet 0 01/21/2017 at Unknown time  . pravastatin (PRAVACHOL) 10 MG tablet Take 5 mg by mouth every evening.    01/20/2017 at Unknown time  . sevelamer carbonate (RENVELA) 800 MG tablet Take 1,600-3,200 mg See admin instructions by mouth. Take 4 tablets after each meal and 2 after each snack   01/21/2017 at Unknown time  . Tetrahydrozoline HCl (VISINE OP) Place 2 drops into both eyes daily as needed (dry eyes).   unkonwn  . warfarin (COUMADIN) 2.5 MG tablet Take 1 tablet daily except 1 1/2 tablets on Sundays, Tuesdays and Thursdays or as directed (Patient taking differently: Take 2.5 mg by mouth See admin instructions. TAKE ONE TABLET ON MONDAYS, TUESDAY,WEDNESDAY,FRIDAY,SATURDAY. TAKE HALF TABLET ON THURSDAY AND  SUNDAY) 135 tablet 3 01/20/2017 at 2100  . diltiazem (CARDIZEM CD) 240 MG 24 hr capsule Take 1 capsule (240 mg total) daily by mouth. (Patient not taking: Reported on 01/21/2017) 30 capsule 0 Not Taking at Unknown time    Assessment: 61yo male on Coumadin PTA for h/o afib.  INR therapeutic on admission.   Goal of Therapy:  INR 2-3 Monitor platelets by anticoagulation protocol: Yes   Plan:  Coumadin 2.5mg  today (home dose) INR daily  [place] Crane, Hargun Spurling A 01/21/2017,5:31 PM

## 2017-01-21 NOTE — ED Provider Notes (Signed)
[facility] EMERGENCY DEPARTMENT Provider Note   CSN: [id number] Arrival date & time: 01/21/17  4081     History   Chief Complaint Chief Complaint  Patient presents with  . Hyperglycemia    HPI Albert [name] is a 61 y.o. male.  Patient with history of end-stage renal disease on dialysis yesterday without difficulty, anemia, GI bleed, atrial fibrillation, diabetes presents with elevated sugars. Patient said they've been elevating over the past 3-4 days over 400 and then unreadable. No fever or infectious symptoms. No shortness of breath. Patient currently is on oral diabetic medications. Patient was on insulin in the past was transitioned.      Past Medical History:  Diagnosis Date  . Anemia   . Atrial fibrillation ([place])   . Cardiomyopathy    LVEF 50-55% 8/11 - [facility]  . Colonic polyp   . Diverticulosis of colon   . ESRD on hemodialysis ([place])    Dr. Lowanda Foster [facility]SAt  . Essential hypertension, benign   . Gout   . Mixed hyperlipidemia   . OSA (obstructive sleep apnea)    CPAP  . Renal cell cancer ([place])   . Type 2 diabetes mellitus ([place])    Type II    Patient Active Problem List   Diagnosis Date Noted  . Gastritis and gastroduodenitis   . Abnormal findings on esophagogastroduodenoscopy (EGD)   . GI bleed 01/05/2017  . Heme positive stool   . Gastrointestinal hemorrhage   . Supratherapeutic INR   . Coagulopathy ([place]) 01/03/2017  . Anemia 01/03/2017  . Constipation 12/17/2015  . Rectal bleeding 12/17/2015  . History of colonic polyps 12/17/2015  . Visit for suture removal 12/06/2013  . Bradycardia 10/12/2013  . Metabolic acidosis 44/81/8563  . Acute respiratory failure with hypercapnia ([place]) 10/12/2013  . Encounter for therapeutic drug monitoring 03/29/2013  . Atrial fibrillation ([place]) 03/10/2011  . Long term (current) use of anticoagulants 03/10/2011  . Secondary cardiomyopathy, unspecified 03/10/2011  . ESRD (end stage renal disease) ([place]) 03/10/2011    . HYPERLIPIDEMIA 08/24/2007  . PRIMARY CENTRAL SLEEP APNEA 08/24/2007  . OBSTRUCTIVE SLEEP APNEA 08/24/2007  . Essential hypertension, benign 08/24/2007    Past Surgical History:  Procedure Laterality Date  . BIOPSY  01/05/2017   Procedure: BIOPSY;  Surgeon: Daneil Dolin, MD;  [facility];  Service: Endoscopy;;  gastric  . COLONOSCOPY N/A 01/07/2016   Dr. [name]: Grade 1 internal hemorrhoids, otherwise normal  . COLONOSCOPY W/ POLYPECTOMY  2009, 2012   2009: Dr. [name]: 1.25 cm adenomatous polyp without high grade dysplasia, distal to IC valve. 2012: normal rectum, few pancolonic diverticula, single diminutive hyperplastic polyp  . ESOPHAGOGASTRODUODENOSCOPY  2009   Dr. [name]: accentuated undulating Z-lin with couple of islands of salmon-colored epithelium suspicious for Barrett's, s/p biopsy with path revealing mild inflammation, no metaplasia, dysplasia, or malignancy  . ESOPHAGOGASTRODUODENOSCOPY N/A 01/05/2017   Procedure: ESOPHAGOGASTRODUODENOSCOPY (EGD);  Surgeon: Daneil Dolin, MD;  Location[facility];  Service: Endoscopy;  Laterality: N/A;  . FISTULA SUPERFICIALIZATION Left 03/31/2015   Procedure: CEPHALIC VEIN TURNDOWN; PLICATION OF BRACHIOCEPHALIC AVF;  Surgeon: Conrad Karnes, MD;  Location: [place];  Service: Vascular;  Laterality: Left;  . FISTULOGRAM N/A 10/19/2013   Procedure: FISTULOGRAM;  Surgeon: Rosetta Posner, MD;  Location: [facility] CATH LAB;  Service: Cardiovascular;  Laterality: N/A;  . Left arm AV fistula  2009   Dr. [name]  . PERIPHERAL VASCULAR CATHETERIZATION N/A 03/10/2015   Procedure: Nolon Stalls;  Surgeon: Jannette Fogo  Bridgett Larsson, MD;  Location: [place] CV LAB;  Service: Cardiovascular;  Laterality: N/A;  . Right nephrectomy         Home Medications    Prior to Admission medications   Medication Sig Start Date End Date Taking? Authorizing Provider  allopurinol (ZYLOPRIM) 100 MG tablet Take 100 mg by mouth daily.    Yes [provider]   CARTIA XT 120 MG 24 hr capsule Take 1 capsule by mouth daily. 01/17/17  Yes [provider]  carvedilol (COREG) 25 MG tablet take 1/2 tablet two times daily Patient taking differently: Take 12.5 mg by mouth 2 (two) times daily with a meal. take 1/2 tablet two times daily 01/06/17  Yes Rizwan, Eunice Blase, MD  cinacalcet (SENSIPAR) 30 MG tablet Take 30 mg by mouth at bedtime.    Yes [provider]  docusate sodium (COLACE) 100 MG capsule Take 100 mg by mouth daily.   Yes [provider]  fenofibrate 160 MG tablet Take 160 mg every evening by mouth.    Yes [provider]  fish oil-omega-3 fatty acids 1000 MG capsule Take 1 g by mouth 3 (three) times daily.    Yes [provider]  GLIPIZIDE XL 5 MG 24 hr tablet Take 1 tablet by mouth daily. 01/19/17  Yes [provider]  linagliptin (TRADJENTA) 5 MG TABS tablet Take 5 mg by mouth daily.   Yes [provider]  pantoprazole (PROTONIX) 40 MG tablet Take 1 tablet (40 mg total) 2 (two) times daily before a meal by mouth. 01/06/17  Yes Rizwan, Saima, MD  pravastatin (PRAVACHOL) 10 MG tablet Take 5 mg by mouth every evening.    Yes [provider]  sevelamer carbonate (RENVELA) 800 MG tablet Take 1,600-3,200 mg See admin instructions by mouth. Take 4 tablets after each meal and 2 after each snack   Yes [provider]  Tetrahydrozoline HCl (VISINE OP) Place 2 drops into both eyes daily as needed (dry eyes).   Yes [provider]  warfarin (COUMADIN) 2.5 MG tablet Take 1 tablet daily except 1 1/2 tablets on Sundays, Tuesdays and Thursdays or as directed Patient taking differently: Take 2.5 mg by mouth See admin instructions. TAKE ONE TABLET ON MONDAYS, TUESDAY,WEDNESDAY,FRIDAY,SATURDAY. TAKE HALF TABLET ON THURSDAY AND SUNDAY 02/19/16  Yes [name], MD  diltiazem (CARDIZEM CD) 240 MG 24 hr capsule Take 1 capsule (240 mg total) daily by mouth. Patient not taking:  Reported on 01/21/2017 01/07/17   Debbe Odea, MD    Family History Family History  Problem Relation Age of Onset  . Hypertension Mother   . Hypertension Unknown        Siblings  . Diabetes type II Unknown        Siblings  . Colon cancer Neg Hx     Social History Social History   Tobacco Use  . Smoking status: Former Smoker    Types: Cigarettes    Last attempt to quit: 07/04/1998    Years since quitting: 18.5  . Smokeless tobacco: Never Used  . Tobacco comment: Quit May 2000  Substance Use Topics  . Alcohol use: No    Alcohol/week: 0.0 oz    Comment: Quit 2014; Previously drank about 3 drinks a day  . Drug use: No     Allergies   Metformin and related   Review of Systems Review of Systems  Constitutional: Positive for fatigue. Negative for chills and fever.  HENT: Negative for ear pain and  sore throat.   Eyes: Negative for pain and visual disturbance.  Respiratory: Negative for cough and shortness of breath.   Cardiovascular: Negative for chest pain and palpitations.  Gastrointestinal: Negative for abdominal pain and vomiting.  Endocrine: Positive for polydipsia.  Genitourinary: Negative for dysuria.  Musculoskeletal: Negative for arthralgias and back pain.  Skin: Negative for color change and rash.  Neurological: Positive for light-headedness. Negative for syncope.  All other systems reviewed and are negative.    Physical Exam Updated Vital Signs BP 124/89   Pulse (!) 104   Temp 97.8 F (36.6 C) (Oral)   Resp 15   Ht 6\' 2"  (1.88 m)   Wt 102.1 kg (225 lb)   SpO2 98%   BMI 28.89 kg/m   Physical Exam  Constitutional: He is oriented to person, place, and time. He appears well-developed and well-nourished.  HENT:  Head: Normocephalic and atraumatic.  Eyes: Conjunctivae are normal. Right eye exhibits no discharge. Left eye exhibits no discharge.  Neck: Normal range of motion. Neck supple. No tracheal deviation present.  Cardiovascular: An  irregularly irregular rhythm present. Tachycardia present.  Pulmonary/Chest: Effort normal and breath sounds normal.  Abdominal: Soft. He exhibits no distension. There is no tenderness. There is no guarding.  Musculoskeletal: He exhibits no edema.  Neurological: He is alert and oriented to person, place, and time. No cranial nerve deficit.  Skin: Skin is warm. No rash noted.  Psychiatric: He has a normal mood and affect.  Nursing note and vitals reviewed.    ED Treatments / Results  Labs (all labs ordered are listed, but only abnormal results are displayed) Labs Reviewed  BASIC METABOLIC PANEL - Abnormal; Notable for the following components:      Result Value   Sodium 127 (*)    Chloride 90 (*)    Glucose, Bld 741 (*)    BUN 38 (*)    Creatinine, Ser 7.73 (*)    Calcium 7.9 (*)    GFR calc non Af Amer 7 (*)    GFR calc Af Amer 8 (*)    All other components within normal limits  CBC - Abnormal; Notable for the following components:   WBC 3.5 (*)    RBC 2.63 (*)    Hemoglobin 7.8 (*)    HCT 25.6 (*)    Platelets 131 (*)    All other components within normal limits  CBG MONITORING, ED - Abnormal; Notable for the following components:   Glucose-Capillary >600 (*)    All other components within normal limits  URINALYSIS, ROUTINE W REFLEX MICROSCOPIC  POC OCCULT BLOOD, ED    EKG  EKG Interpretation None       Radiology No results found.  Procedures .Critical Care Performed by: Elnora Morrison, MD Authorized by: Elnora Morrison, MD   Critical care provider statement:    Critical care time (minutes):  40   Critical care start time:  01/21/2017 11:35 PM   Critical care end time:  01/21/2017 12:05 PM   Critical care time was exclusive of:  Separately billable procedures and treating other patients and teaching time   Critical care was necessary to treat or prevent imminent or life-threatening deterioration of the following conditions:  Metabolic crisis   Critical  care was time spent personally by me on the following activities:  Evaluation of patient's response to treatment, examination of patient, ordering and performing treatments and interventions and ordering and review of laboratory studies   I assumed direction of critical  care for this patient from another provider in my specialty: no     (including critical care time)  Medications Ordered in ED Medications  insulin regular (NOVOLIN R,HUMULIN R) 100 Units in sodium chloride 0.9 % 100 mL (1 Units/mL) infusion (not administered)  potassium chloride 10 mEq in 100 mL IVPB (10 mEq Intravenous New Bag/Given 01/21/17 1229)     Initial Impression / Assessment and Plan / ED Course  I have reviewed the triage vital signs and the nursing notes.  Pertinent labs & imaging results that were available during my care of the patient were reviewed by me and considered in my medical decision making (see chart for details).    Patient presents with mild symptoms and uncontrolled diabetes. No anion gap, patient does not produce urine. Patient's sugar greater than 700 and was symptoms plan for insulin drip to help control and titrate. Patient will be admitted to stepdown discuss this with the hospitalist. No infectious source at this time. No chest pain at this time.  Small fluid bolus. The patients results and plan were reviewed and discussed.   Any x-rays performed were independently reviewed by myself.   Differential diagnosis were considered with the presenting HPI.  Medications  insulin regular (NOVOLIN R,HUMULIN R) 100 Units in sodium chloride 0.9 % 100 mL (1 Units/mL) infusion (not administered)  potassium chloride 10 mEq in 100 mL IVPB (10 mEq Intravenous New Bag/Given 01/21/17 1229)    Vitals:   01/21/17 1009 01/21/17 1012 01/21/17 1130  BP:  (!) 139/92 124/89  Pulse:  (!) 116 (!) 104  Resp:  18 15  Temp:  97.8 F (36.6 C)   TempSrc:  Oral   SpO2:  99% 98%  Weight: 102.1 kg (225 lb)     Height: 6\' 2"  (1.88 m)      Final diagnoses:  Hyperglycemia  Diabetes mellitus type II, non insulin dependent (HCC)  ESRD on dialysis [facility])  Atrial fibrillation with RVR ([place])    Admission/ observation were discussed with the admitting physician, patient and/or family and they are comfortable with the plan.     Final Clinical Impressions(s) / ED Diagnoses   Final diagnoses:  Hyperglycemia  Diabetes mellitus type II, non insulin dependent ([place])  ESRD on dialysis [facility])  Atrial fibrillation with RVR [facility])    ED Discharge Orders    None       Elnora Morrison, MD 01/21/17 1237

## 2017-01-21 NOTE — Consult Note (Signed)
Albert [name] MRN: [id number] DOB/AGE: 63-Sep-1957 63 y.o. Primary Care Physician:Fusco, Purcell Nails, MD Admit date: 01/21/2017 Chief Complaint:  Chief Complaint  Patient presents with  . Hyperglycemia   HPI: Patient is 61 year old African [place] male  With past medical  history of end-stage renal disease who presents to ER with c/o elevated sugars.   HPI dates back to past 304 days when pt noticed taht his readings on the glucometer have been elevating over the past 3-4 days over 400 and then unreadable. Pt says besides that " I am doing fone" NO c/o fever/cough/chills NO c/o chest pain or shortness of breath.  NO c.o nausea/vomiting/abdominal pain NO c/o hematuria NO c/o dizziness No c/o syncope NO c/o recent travel    Past Medical History:  Diagnosis Date  . Anemia   . Atrial fibrillation ([place])   . Cardiomyopathy    LVEF 50-55% 8/11 - [facility]  . Colonic polyp   . Diverticulosis of colon   . ESRD on hemodialysis ([place])    Dr. Lowanda Foster TTHSAt  . Essential hypertension, benign   . Gout   . Mixed hyperlipidemia   . OSA (obstructive sleep apnea)    CPAP  . Renal cell cancer ([place])   . Type 2 diabetes mellitus (HCC)    Type II        Family History  Problem Relation Age of Onset  . Hypertension Mother   . Hypertension Unknown        Siblings  . Diabetes type II Unknown        Siblings  . Colon cancer Neg Hx     Social History:  reports that he quit smoking about 18 years ago. His smoking use included cigarettes. he has never used smokeless tobacco. He reports that he does not drink alcohol or use drugs.   Allergies:  Allergies  Allergen Reactions  . Metformin And Related Other (See Comments)    Diarrhea, bloody stool, upset stomach.    Medications Reviewed.     HGD:JMEQA from the symptoms mentioned above,there are no other symptoms referable to all systems reviewed.  Physical Exam: Vital signs in last 24 hours: Temp:  [97.8 F (36.6 C)] 97.8  F (36.6 C) (11/30 1012) Pulse Rate:  [92-116] 114 (11/30 1400) Resp:  [12-26] 26 (11/30 1400) BP: (124-139)/(83-92) 127/90 (11/30 1400) SpO2:  [96 %-100 %] 100 % (11/30 1400) Weight:  [225 lb (102.1 kg)] 225 lb (102.1 kg) (11/30 1009) Weight change:     Intake/Output from previous day: No intake/output data recorded. Total I/O In: 350 [IV Piggyback:350] Out: -    Physical Exam: General- pt is awake,alert, oriented to time place and person Resp- No acute REsp distress, CTA B/L NO Rhonchi CVS- S1S2 regular in rate and rhythm GIT- BS+, soft, NT, ND EXT- NO LE Edema, Cyanosis CNS- CN 2-12 grossly intact. Moving all 4 extremities Psych- normal mod and affect Access- AVF   Lab Results: CBC Recent Labs    01/21/17 1014  WBC 3.5*  HGB 7.8*  HCT 25.6*  PLT 131*    BMET Recent Labs    01/21/17 1014  NA 127*  K 3.7  CL 90*  CO2 26  GLUCOSE 741*  BUN 38*  CREATININE 7.73*  CALCIUM 7.9*    MICRO No results found for this or any previous visit (from the past 240 hour(s)).    Lab Results  Component Value Date   PTH 350.0 (H) 03/21/2007   CALCIUM 7.9 (L) 01/21/2017  CAION 0.95 (L) 01/18/2017   PHOS 5.9 (H) 01/05/2017      Impression: 1)Renal  ESRD on HD                Pt is on Tuesday/Thursday/Saturday schedule .                Pt was last dialyzed yesterday .                No need of HD today.   2)HTN  Medication- On Calcium Channel Blockers   3)Anemia In ESRD the goal for HGb is 9--11. Hgb not at goal Hx of Anemia of chronic ds Hx of recent GI bleed  Will keep on epo   4)CKD Mineral-Bone Disorder Secondary Hyperparathyroidism present. Phosphorus near to goal.  On binders  5)Endo -admitted with uncontrolled DM  Primary MD following  6)Electrolytes  Normokalemic   Hyponatremic Sec to ESRD  7)Acid base Co2 at goal       Plan:   Agree with current tx and plan Will dialyze in am Will use 2k bath Will keep  on epo     [name] 01/21/2017, 2:13 PM

## 2017-01-21 NOTE — H&P (Signed)
History and Physical    LANIS STORLIE [name]:[id number] DOB: 17-Oct-1955 DOA: 01/21/2017  PCP: Redmond School, MD Patient coming from: Home  Chief Complaint: hyperglycemia  HPI: Albert [name] is a 61 y.o. male with medical history significant of diabetes mellitus Type II, ESRD on dialysis T/Th/Saturday presents for hyperglycemia.  Per patient he was recently on metformin which caused significant stomach upset so he was switched to glipizide which he started yesterday. Prior to being metformin he was on Tradjenta.  This morning he woke up and he checked his blood sugar and his meter read "High".  He took a tradjenta he had leftover and two glipizide.  He waited an hour and rechecked and his blood sugar was still high.  No nausea or vomiting. Patient has only felt dizzy since his blood sugars were elevated.  Patient tolerated his dialysis without problem yesterday.  No fevers or chills at home, no chest pain or shortness of breath.  Patient reports that besides his elevated blood sugar he is doing well.   ED Course: Patient seen and evaluated by emergency room physician.  Found to have a blood sugar of 741, sodium of 127 (likely due to hyperglycemia) creatinine of  H&H of 7.8/25.6 and an INR of 2.54.  [facility] asked to admit patient for hyperglycemia management.  He was started on glucose stabilizer in the emergency department.  Review of Systems: As per HPI otherwise 10 point review of systems negative.    Past Medical History:  Diagnosis Date  . Anemia   . Atrial fibrillation ([place])   . Cardiomyopathy    LVEF 50-55% 8/11 - [facility]  . Colonic polyp   . Diverticulosis of colon   . ESRD on hemodialysis ([place])    Dr. Lowanda Foster [facility]  . Essential hypertension, benign   . Gout   . Mixed hyperlipidemia   . OSA (obstructive sleep apnea)    CPAP  . Renal cell cancer ([place])   . Type 2 diabetes mellitus ([place])    Type II    Past Surgical History:  Procedure Laterality Date  . BIOPSY   01/05/2017   Procedure: BIOPSY;  Surgeon: Daneil Dolin, MD;  [facility];  Service: Endoscopy;;  gastric  . COLONOSCOPY N/A 01/07/2016   Dr. [name]: Grade 1 internal hemorrhoids, otherwise normal  . COLONOSCOPY W/ POLYPECTOMY  2009, 2012   2009: Dr. [name]: 1.25 cm adenomatous polyp without high grade dysplasia, distal to IC valve. 2012: normal rectum, few pancolonic diverticula, single diminutive hyperplastic polyp  . ESOPHAGOGASTRODUODENOSCOPY  2009   Dr. [name]: accentuated undulating Z-lin with couple of islands of salmon-colored epithelium suspicious for Barrett's, s/p biopsy with path revealing mild inflammation, no metaplasia, dysplasia, or malignancy  . ESOPHAGOGASTRODUODENOSCOPY N/A 01/05/2017   Procedure: ESOPHAGOGASTRODUODENOSCOPY (EGD);  Surgeon: Daneil Dolin, MD;  [facility];  Service: Endoscopy;  Laterality: N/A;  . FISTULA SUPERFICIALIZATION Left 03/31/2015   Procedure: CEPHALIC VEIN [place]URNDOWN; PLICATION OF BRACHIOCEPHALIC AVF;  Surgeon: Conrad Natchez, MD;  Location: [facility];  Service: Vascular;  Laterality: Left;  . FISTULOGRAM N/A 10/19/2013   Procedure: FISTULOGRAM;  Surgeon: Rosetta Posner, MD;  Location: [facility] CATH LAB;  Service: Cardiovascular;  Laterality: N/A;  . Left arm AV fistula  2009   Dr. [name]  . PERIPHERAL VASCULAR CATHETERIZATION N/A 03/10/2015   Procedure: Fistulagram;  Surgeon: Conrad Lynchburg, MD;  Location: [facility] CV LAB;  Service: Cardiovascular;  Laterality: N/A;  . Right nephrectomy  reports that he quit smoking about 18 years ago. His smoking use included cigarettes. he has never used smokeless tobacco. He reports that he does not drink alcohol or use drugs. Used to drink alcohol  Allergies  Allergen Reactions  . Metformin And Related Other (See Comments)    Diarrhea, bloody stool, upset stomach.     Family History  Problem Relation Age of Onset  . Hypertension Mother   . Hypertension Unknown        Siblings  .  Diabetes type II Unknown        Siblings  . Colon cancer Neg Hx   cervical cancer in his mother, no kidney disease in family, sister had brain cancer, niece with breast cancer.   Prior to Admission medications   Medication Sig Start Date End Date Taking? Authorizing Provider  allopurinol (ZYLOPRIM) 100 MG tablet Take 100 mg by mouth daily.    Yes [provider]  CARTIA XT 120 MG 24 hr capsule Take 1 capsule by mouth daily. 01/17/17  Yes [provider]  carvedilol (COREG) 25 MG tablet take 1/2 tablet two times daily Patient taking differently: Take 12.5 mg by mouth 2 (two) times daily with a meal. take 1/2 tablet two times daily 01/06/17  Yes Rizwan, Eunice Blase, MD  cinacalcet (SENSIPAR) 30 MG tablet Take 30 mg by mouth at bedtime.    Yes [provider]  docusate sodium (COLACE) 100 MG capsule Take 100 mg by mouth daily.   Yes [provider]  fenofibrate 160 MG tablet Take 160 mg every evening by mouth.    Yes [provider]  fish oil-omega-3 fatty acids 1000 MG capsule Take 1 g by mouth 3 (three) times daily.    Yes [provider]  GLIPIZIDE XL 5 MG 24 hr tablet Take 1 tablet by mouth daily. 01/19/17  Yes [provider]  linagliptin (TRADJENTA) 5 MG TABS tablet Take 5 mg by mouth daily.   Yes [provider]  pantoprazole (PROTONIX) 40 MG tablet Take 1 tablet (40 mg total) 2 (two) times daily before a meal by mouth. 01/06/17  Yes Rizwan, Saima, MD  pravastatin (PRAVACHOL) 10 MG tablet Take 5 mg by mouth every evening.    Yes [provider]  sevelamer carbonate (RENVELA) 800 MG tablet Take 1,600-3,200 mg See admin instructions by mouth. Take 4 tablets after each meal and 2 after each snack   Yes [provider]  Tetrahydrozoline HCl (VISINE OP) Place 2 drops into both eyes daily as needed (dry eyes).   Yes [provider]  warfarin (COUMADIN) 2.5 MG tablet Take 1 tablet daily except 1 1/2  tablets on Sundays, Tuesdays and Thursdays or as directed Patient taking differently: Take 2.5 mg by mouth See admin instructions. TAKE ONE TABLET ON MONDAYS, TUESDAY,WEDNESDAY,FRIDAY,SATURDAY. TAKE HALF TABLET ON THURSDAY AND SUNDAY 02/19/16  Yes [name], MD  diltiazem (CARDIZEM CD) 240 MG 24 hr capsule Take 1 capsule (240 mg total) daily by mouth. Patient not taking: Reported on 01/21/2017 01/07/17   Debbe Odea, MD    Physical Exam: Vitals:   01/21/17 1300 01/21/17 1330 01/21/17 1400 01/21/17 1430  BP: 137/83 130/87 127/90 (!) 137/93  Pulse: (!) 107 (!) 107 (!) 114 (!) 105  Resp: 17 12 (!) 26 19  Temp:      TempSrc:      SpO2: 98% 97% 100% 98%  Weight:      Height:  Constitutional: NAD, calm, comfortable Vitals:   01/21/17 1300 01/21/17 1330 01/21/17 1400 01/21/17 1430  BP: 137/83 130/87 127/90 (!) 137/93  Pulse: (!) 107 (!) 107 (!) 114 (!) 105  Resp: 17 12 (!) 26 19  Temp:      TempSrc:      SpO2: 98% 97% 100% 98%  Weight:      Height:       Eyes: PERRL, lids and conjunctivae normal ENMT: Mucous membranes are moist. Posterior pharynx clear of any exudate or lesions.Normal dentition.  Neck: normal, supple, no masses, no thyromegaly Respiratory: clear to auscultation bilaterally, no wheezing, no crackles. Normal respiratory effort. No accessory muscle use.  Cardiovascular: Regular rate and rhythm, no murmurs / rubs / gallops. No extremity edema. 2+ pedal pulses. No carotid bruits.  Abdomen: no tenderness, no masses palpated. No hepatosplenomegaly. Bowel sounds positive.  Musculoskeletal: no clubbing / cyanosis.  Palpable thrill in left upper extremity at site of fistula, good ROM, no contractures. Normal muscle tone.  Skin: no rashes, lesions, ulcers. No induration Neurologic: CN 2-12 grossly intact. Sensation intact, DTR normal. Strength 5/5 in all 4.  Psychiatric: Normal judgment and insight. Alert and oriented x 3. Normal mood.    Labs on  Admission: I have personally reviewed following labs and imaging studies  CBC: Recent Labs  Lab 01/18/17 1326 01/18/17 1346 01/21/17 1014  WBC 5.1  --  3.5*  HGB 8.8* 9.9* 7.8*  HCT 27.4* 29.0* 25.6*  MCV 94.8  --  97.3  PLT 172  --  604*   Basic Metabolic Panel: Recent Labs  Lab 01/18/17 1326 01/18/17 1346 01/21/17 1014  NA 132* 135 127*  K 3.3* 3.3* 3.7  CL 94* 90* 90*  CO2 27  --  26  GLUCOSE 557* 554* 741*  BUN 29* 29* 38*  CREATININE 6.16* 5.70* 7.73*  CALCIUM 8.0*  --  7.9*   GFR: Estimated Creatinine Clearance: 12.5 mL/min (A) (by C-G formula based on SCr of 7.73 mg/dL (H)). Liver Function Tests: No results for input(s): AST, ALT, ALKPHOS, BILITOT, PROT, ALBUMIN in the last 168 hours. No results for input(s): LIPASE, AMYLASE in the last 168 hours. No results for input(s): AMMONIA in the last 168 hours. Coagulation Profile: Recent Labs  Lab 01/17/17 1141 01/17/17 1147 01/18/17 1327  INR 2.3 2.3 2.54   Cardiac Enzymes: No results for input(s): CKTOTAL, CKMB, CKMBINDEX, TROPONINI in the last 168 hours. BNP (last 3 results) No results for input(s): PROBNP in the last 8760 hours. HbA1C: No results for input(s): HGBA1C in the last 72 hours. CBG: Recent Labs  Lab 01/18/17 1324 01/18/17 1540 01/21/17 1025 01/21/17 1356 01/21/17 1451  GLUCAP 535* 495* >600* >600* 580*   Lipid Profile: No results for input(s): CHOL, HDL, LDLCALC, TRIG, CHOLHDL, LDLDIRECT in the last 72 hours. Thyroid Function Tests: No results for input(s): TSH, T4TOTAL, FREET4, T3FREE, THYROIDAB in the last 72 hours. Anemia Panel: No results for input(s): VITAMINB12, FOLATE, FERRITIN, TIBC, IRON, RETICCTPCT in the last 72 hours. Urine analysis:    Component Value Date/Time   COLORURINE YELLOW 10/12/2013 1045   APPEARANCEUR CLEAR 10/12/2013 1045   LABSPEC 1.015 10/12/2013 1045   PHURINE 8.0 10/12/2013 1045   GLUCOSEU NEGATIVE 10/12/2013 1045   HGBUR MODERATE (A) 10/12/2013 1045    BILIRUBINUR NEGATIVE 10/12/2013 1045   KETONESUR NEGATIVE 10/12/2013 1045   PROTEINUR 30 (A) 10/12/2013 1045   UROBILINOGEN 1.0 10/12/2013 1045   NITRITE NEGATIVE 10/12/2013 1045   LEUKOCYTESUR NEGATIVE 10/12/2013 1045  Sepsis Labs: !!!!!!!!!!!!!!!!!!!!!!!!!!!!!!!!!!!!!!!!!!!! @LABRCNTIP (procalcitonin:4,lacticidven:4) )No results found for this or any previous visit (from the past 240 hour(s)).   Radiological Exams on Admission: No results found.  EKG: No EKG performed today  Assessment/Plan Principal Problem:   Hyperglycemia Active Problems:   OBSTRUCTIVE SLEEP APNEA   Essential hypertension, benign   Atrial fibrillation (HCC)   ESRD (end stage renal disease) (HCC)   Anemia     Hyperglycemia -Glucose stabilizer initiated in the emergency department -Repeat BMP every 4 hours even though patient currently is not showing any signs of DKA - Hemoglobin A1c ordered - Judicious use of fluids given patient's renal failure - May need to discuss outpatient diabetic medications given intolerance to metformin and hyperglycemia on glipizide  Essential hypertension -Continue Coreg -Continue diltiazem  End-stage renal disease on dialysis -Continue Renvela - Continue Sensipar - Nephrology consulted - Patient is on a Tuesday Thursday Saturday dialysis schedule  Atrial fibrillation - Continue diltiazem -Continue Coumadin per pharmacy  Anemia - Repeat H&H at 5 PM and 5 AM - Monitor INR -Patient reports that he was supposed to follow-up with a GI doctor after his last hospitalization for a GI bleed    DVT prophylaxis: On Coumadin outpatient, INR of 2.5  Code Status: Full code  Family Communication: No family bedside Disposition Plan: Pending improvement in blood sugar control  Consults called: Nephrology  Admission status: Inpatient, stepdown unit   Loretha Stapler MD [facility] Pager [phone]  If 7PM-7AM, please contact  night-coverage [email or web address] Password [facility]  01/21/2017, 3:48 PM

## 2017-01-22 DIAGNOSIS — G4733 Obstructive sleep apnea (adult) (pediatric): Secondary | ICD-10-CM

## 2017-01-22 DIAGNOSIS — D631 Anemia in chronic kidney disease: Secondary | ICD-10-CM | POA: Diagnosis not present

## 2017-01-22 DIAGNOSIS — I4891 Unspecified atrial fibrillation: Secondary | ICD-10-CM | POA: Diagnosis not present

## 2017-01-22 DIAGNOSIS — R739 Hyperglycemia, unspecified: Secondary | ICD-10-CM | POA: Diagnosis not present

## 2017-01-22 DIAGNOSIS — E119 Type 2 diabetes mellitus without complications: Secondary | ICD-10-CM | POA: Diagnosis not present

## 2017-01-22 DIAGNOSIS — D649 Anemia, unspecified: Secondary | ICD-10-CM | POA: Diagnosis not present

## 2017-01-22 DIAGNOSIS — Z992 Dependence on renal dialysis: Secondary | ICD-10-CM | POA: Diagnosis not present

## 2017-01-22 DIAGNOSIS — I10 Essential (primary) hypertension: Secondary | ICD-10-CM | POA: Diagnosis not present

## 2017-01-22 DIAGNOSIS — N186 End stage renal disease: Secondary | ICD-10-CM | POA: Diagnosis not present

## 2017-01-22 LAB — BASIC METABOLIC PANEL
Anion gap: 8 (ref 5–15)
Anion gap: 11 (ref 5–15)
Anion gap: 12 (ref 5–15)
BUN: 45 mg/dL — ABNORMAL HIGH (ref 6–20)
BUN: 46 mg/dL — ABNORMAL HIGH (ref 6–20)
BUN: 47 mg/dL — ABNORMAL HIGH (ref 6–20)
CALCIUM: 8.2 mg/dL — AB (ref 8.9–10.3)
CHLORIDE: 101 mmol/L (ref 101–111)
CHLORIDE: 99 mmol/L — AB (ref 101–111)
CHLORIDE: 98 mmol/L — AB (ref 101–111)
CO2: 26 mmol/L (ref 22–32)
CO2: 25 mmol/L (ref 22–32)
CO2: 23 mmol/L (ref 22–32)
CREATININE: 8.52 mg/dL — AB (ref 0.61–1.24)
CREATININE: 8.61 mg/dL — AB (ref 0.61–1.24)
CREATININE: 9.07 mg/dL — AB (ref 0.61–1.24)
Calcium: 8.4 mg/dL — ABNORMAL LOW (ref 8.9–10.3)
Calcium: 7.9 mg/dL — ABNORMAL LOW (ref 8.9–10.3)
GFR calc non Af Amer: 6 mL/min — ABNORMAL LOW (ref >60)
GFR calc non Af Amer: 6 mL/min — ABNORMAL LOW (ref >60)
GFR calc non Af Amer: 5 mL/min — ABNORMAL LOW (ref >60)
GFR, EST AFRICAN AMERICAN: 7 mL/min — AB (ref >60)
GFR, EST AFRICAN AMERICAN: 7 mL/min — AB (ref >60)
GFR, EST AFRICAN AMERICAN: 6 mL/min — AB (ref >60)
GLUCOSE: 102 mg/dL — AB (ref 65–99)
GLUCOSE: 159 mg/dL — AB (ref 65–99)
Glucose, Bld: 254 mg/dL — ABNORMAL HIGH (ref 65–99)
Potassium: 3.3 mmol/L — ABNORMAL LOW (ref 3.5–5.1)
Potassium: 3.4 mmol/L — ABNORMAL LOW (ref 3.5–5.1)
Potassium: 3.8 mmol/L (ref 3.5–5.1)
SODIUM: 133 mmol/L — AB (ref 135–145)
Sodium: 135 mmol/L (ref 135–145)
Sodium: 135 mmol/L (ref 135–145)

## 2017-01-22 LAB — GLUCOSE, CAPILLARY
GLUCOSE-CAPILLARY: 101 mg/dL — AB (ref 65–99)
GLUCOSE-CAPILLARY: 101 mg/dL — AB (ref 65–99)
GLUCOSE-CAPILLARY: 143 mg/dL — AB (ref 65–99)
GLUCOSE-CAPILLARY: 149 mg/dL — AB (ref 65–99)
GLUCOSE-CAPILLARY: 205 mg/dL — AB (ref 65–99)
Glucose-Capillary: 119 mg/dL — ABNORMAL HIGH (ref 65–99)
Glucose-Capillary: 160 mg/dL — ABNORMAL HIGH (ref 65–99)

## 2017-01-22 LAB — HEMOGLOBIN AND HEMATOCRIT, BLOOD
HEMATOCRIT: 25.2 % — AB (ref 39.0–52.0)
Hemoglobin: 8 g/dL — ABNORMAL LOW (ref 13.0–17.0)

## 2017-01-22 LAB — PROTIME-INR
INR: 3.34
PROTHROMBIN TIME: 33.6 s — AB (ref 11.4–15.2)

## 2017-01-22 MED ORDER — SODIUM CHLORIDE 0.9 % IV SOLN: 100.0000 mL | INTRAVENOUS | Status: DC | PRN

## 2017-01-22 MED ORDER — LIDOCAINE HCL (PF) 1 % IJ SOLN: 5.0000 mL | INTRAMUSCULAR | Status: DC | PRN

## 2017-01-22 MED ORDER — HEPARIN SODIUM (PORCINE) 1000 UNIT/ML DIALYSIS
20.0000 [IU]/kg | INTRAMUSCULAR | Status: DC | PRN
  Filled 2017-01-22: qty 2

## 2017-01-22 MED ORDER — INSULIN PEN NEEDLE 31G X 5 MM MISC: 0 refills | Status: DC

## 2017-01-22 MED ORDER — PENTAFLUOROPROP-TETRAFLUOROETH EX AERO
1.0000 "application " | INHALATION_SPRAY | CUTANEOUS | Status: DC | PRN
  Filled 2017-01-22: qty 30

## 2017-01-22 MED ORDER — SODIUM CHLORIDE 0.9% FLUSH
3.0000 mL | Freq: Two times a day (BID) | INTRAVENOUS | Status: DC
  Administered 2017-01-22 (×2): 3 mL via INTRAVENOUS

## 2017-01-22 MED ORDER — BLOOD GLUCOSE METER KIT: PACK | 0 refills | Status: DC

## 2017-01-22 MED ORDER — EPOETIN ALFA 4000 UNIT/ML IJ SOLN
INTRAMUSCULAR | Status: AC
  Administered 2017-01-22: 8000 [IU]
  Filled 2017-01-22: qty 2

## 2017-01-22 MED ORDER — INSULIN GLARGINE 100 UNIT/ML SOLOSTAR PEN: 10.0000 [IU] | PEN_INJECTOR | Freq: Every day | SUBCUTANEOUS | 0 refills | Status: DC

## 2017-01-22 MED ORDER — SODIUM CHLORIDE 0.9 % IV SOLN: 250.0000 mL | INTRAVENOUS | Status: DC | PRN

## 2017-01-22 MED ORDER — INSULIN ASPART 100 UNIT/ML ~~LOC~~ SOLN: 0.0000 [IU] | Freq: Every day | SUBCUTANEOUS | Status: DC

## 2017-01-22 MED ORDER — INSULIN ASPART 100 UNIT/ML ~~LOC~~ SOLN
0.0000 [IU] | Freq: Three times a day (TID) | SUBCUTANEOUS | Status: DC
  Administered 2017-01-22: 1 [IU] via SUBCUTANEOUS
  Administered 2017-01-22: 3 [IU] via SUBCUTANEOUS

## 2017-01-22 MED ORDER — SODIUM CHLORIDE 0.9% FLUSH: 3.0000 mL | INTRAVENOUS | Status: DC | PRN

## 2017-01-22 MED ORDER — LIDOCAINE-PRILOCAINE 2.5-2.5 % EX CREA
1.0000 "application " | TOPICAL_CREAM | CUTANEOUS | Status: DC | PRN
  Filled 2017-01-22: qty 5

## 2017-01-22 MED ORDER — HEPARIN SODIUM (PORCINE) 1000 UNIT/ML DIALYSIS
1000.0000 [IU] | INTRAMUSCULAR | Status: DC | PRN
  Filled 2017-01-22: qty 1

## 2017-01-22 MED ORDER — ALTEPLASE 2 MG IJ SOLR
2.0000 mg | Freq: Once | INTRAMUSCULAR | Status: DC | PRN
  Filled 2017-01-22: qty 2

## 2017-01-22 MED ORDER — INSULIN GLARGINE 100 UNIT/ML ~~LOC~~ SOLN
5.0000 [IU] | Freq: Once | SUBCUTANEOUS | Status: AC
  Administered 2017-01-22: 5 [IU] via SUBCUTANEOUS
  Filled 2017-01-22: qty 0.05

## 2017-01-22 NOTE — Discharge Instructions (Signed)
AV Fistula Placement, Care After Refer to this sheet in the next few weeks. These instructions provide you with information about caring for yourself after your procedure. Your health care provider may also give you more specific instructions. Your treatment has been planned according to current medical practices, but problems sometimes occur. Call your health care provider if you have any problems or questions after your procedure. What can I expect after the procedure? After your procedure, it is common to:  Feel sore.  Have numbness.  Feel cold.  Feel a vibration (thrill) over the fistula.  Follow these instructions at home:  Incision care  Do not take baths or showers, swim, or use a hot tub until your health care provider approves.  Keep the area around your cut from surgery (incision) clean and dry.  Follow instructions from your health care provider about how to take care of your incision. Make sure you: ? Wash your hands with soap and water before you change your bandage (dressing). If soap and water are not available, use hand sanitizer. ? Change your dressing as told by your health care provider. ? Leave stitches (sutures) and clips in place. They may need to stay in place for 2 weeks or longer. Fistula Care  Check your fistula site every day to make sure the thrill feels the same.  Check your fistula site every day for signs of infection. Watch for: ? Redness. ? Swelling. ? Discharge. ? Tenderness. ? Enlargement.  Keep your arm raised (elevated) while you rest.  Do not lift anything that is heavier than a gallon of milk with the arm that has the fistula.  Do not lie down on your fistula arm.  Do not let anyone draw blood or take a blood pressure reading on your fistula arm.  Do not wear tight jewelry or clothing over your fistula arm. General instructions  Rest at home for a day or two.  Gradually start doing your usual activities again. Ask your surgeon  when you can return to work or school.  Take over-the-counter and prescription medicines only as told by your health care provider.  Keep all follow-up visits as told by your health care provider. This is important. Contact a health care provider if:  You have chills or a fever.  You have pain at your fistula site that is not going away.  You have numbness or coldness at your fistula site that is not going away.  You feel a decrease or a change in the thrill.  You have swelling in your arm or hand.  You have redness, swelling, discharge, tenderness, or enlargement at your fistula site. Get help right away if:  You are bleeding from your fistula site.  You have chest pain.  You have trouble breathing. This information is not intended to replace advice given to you by your health care provider. Make sure you discuss any questions you have with your health care provider. Document Released: 02/08/2005 Document Revised: 06/18/2015 Document Reviewed: 05/01/2014 Elsevier Interactive Patient Education  2018 [place].  Aspartate Aminotransferase (AST) Test Why am I having this test? Aspartate aminotransferase (AST) test identifies suspected liver diseases. AST is an enzyme that is released into the blood when liver or muscle cells are injured. This test is usually done when there are signs of liver problems. What kind of sample is taken? A blood sample is required for this test. It is usually collected by inserting a needle into a vein. How do I prepare  for this test?  Your health care provider may instruct you to avoid taking medicines, particularly those that require intramuscular injection (IM) 12 hours before the test. Follow your health care provider's instructions.  This test usually requires that a blood sample be collected once each day for three days, and then again in one week. What are the reference ranges? Reference ranges are considered healthy ranges established  after testing a large group of healthy people. Reference ranges may vary among different people, labs, and hospitals. It is your responsibility to obtain your test results. Ask the lab or department performing the test when and how you will get your results. Reference ranges for AST are the following:  14-69 days old: 35-140 units/L.  Less than 69 years old: 15-60 units/L.  33-51 years old: 15-50 units/L.  24-32 years old: 10-50 units/L.  19-63 years old: 10-40 units/L.  Adult: 0-35 units/L or 0-0.58 microkat/L.  Elderly: The reference range is slightly higher than the adult range.  Females tend to have slightly lower levels than males. What do the results mean? Results higher than the reference ranges may indicate:  Liver diseases.  Skeletal muscle diseases.  Other disease that destroy the red blood cells or tissues of the pancreas.  Results lower than the reference ranges may indicate:  Acute kidney disease.  Pregnancy.  Diabetic ketoacidosis.  Chronic kidney dialysis.  Symptomatic vitamin B1 deficiency.  Talk with your health care provider to discuss your results, treatment options, and if necessary, the need for more tests. Talk with your health care provider if you have any questions about your results. Talk with your health care provider to discuss your results, treatment options, and if necessary, the need for more tests. Talk with your health care provider if you have any questions about your results. This information is not intended to replace advice given to you by your health care provider. Make sure you discuss any questions you have with your health care provider. Document Released: 03/03/2004 Document Revised: 10/13/2015 Document Reviewed: 07/09/2013 Elsevier Interactive Patient Education  [employer].

## 2017-01-22 NOTE — Progress Notes (Signed)
[facility] for Coumadin (home med) Indication: atrial fibrillation  Allergies  Allergen Reactions  . Metformin And Related Other (See Comments)    Diarrhea, bloody stool, upset stomach.    Patient Measurements: Height: 6\' 2"  (188 cm) Weight: 229 lb 11.5 oz (104.2 kg) IBW/kg (Calculated) : 82.2  Vital Signs: Temp: 98.3 F (36.8 C) (12/01 0839) Temp Source: Oral (12/01 0839) BP: 115/74 (12/01 1000) Pulse Rate: 124 (12/01 1000)  Labs: Recent Labs    01/21/17 1014 01/21/17 1806  01/22/17 0103 01/22/17 0431 01/22/17 0915  HGB 7.8* 8.2*  --   --  8.0*  --   HCT 25.6* 26.1*  --   --  25.2*  --   PLT 131*  --   --   --   --   --   LABPROT 29.8*  --   --   --  33.6*  --   INR 2.86  --   --   --  3.34  --   CREATININE 7.73* 8.05*   < > 8.52* 8.61* 9.07*   < > = values in this interval not displayed.    Estimated Creatinine Clearance: 10.7 mL/min (A) (by C-G formula based on SCr of 9.07 mg/dL (H)).   Medical History: Past Medical History:  Diagnosis Date  . Anemia   . Atrial fibrillation ([place])   . Cardiomyopathy    LVEF 50-55% 8/11 - [facility]  . Colonic polyp   . Diverticulosis of colon   . ESRD on hemodialysis ([place])    Dr. Lowanda Foster [facility]  . Essential hypertension, benign   . Gout   . Mixed hyperlipidemia   . OSA (obstructive sleep apnea)    CPAP  . Renal cell cancer ([place])   . Type 2 diabetes mellitus (HCC)    Type II    Medications:  Medications Prior to Admission  Medication Sig Dispense Refill Last Dose  . allopurinol (ZYLOPRIM) 100 MG tablet Take 100 mg by mouth daily.    01/21/2017 at Unknown time  . CARTIA XT 120 MG 24 hr capsule Take 1 capsule by mouth daily.   01/21/2017 at Unknown time  . carvedilol (COREG) 25 MG tablet take 1/2 tablet two times daily (Patient taking differently: Take 12.5 mg by mouth 2 (two) times daily with a meal. take 1/2 tablet two times daily) 180 tablet 3 01/21/2017 at Unknown time  .  cinacalcet (SENSIPAR) 30 MG tablet Take 30 mg by mouth at bedtime.    01/20/2017 at Unknown time  . docusate sodium (COLACE) 100 MG capsule Take 100 mg by mouth daily.   01/21/2017 at Unknown time  . fenofibrate 160 MG tablet Take 160 mg every evening by mouth.    01/20/2017 at Unknown time  . fish oil-omega-3 fatty acids 1000 MG capsule Take 1 g by mouth 3 (three) times daily.    01/21/2017 at Unknown time  . GLIPIZIDE XL 5 MG 24 hr tablet Take 1 tablet by mouth daily.   01/21/2017 at Unknown time  . linagliptin (TRADJENTA) 5 MG TABS tablet Take 5 mg by mouth daily.   01/21/2017 at Unknown time  . pantoprazole (PROTONIX) 40 MG tablet Take 1 tablet (40 mg total) 2 (two) times daily before a meal by mouth. 60 tablet 0 01/21/2017 at Unknown time  . pravastatin (PRAVACHOL) 10 MG tablet Take 5 mg by mouth every evening.    01/20/2017 at Unknown time  . sevelamer carbonate (RENVELA) 800 MG tablet Take  1,600-3,200 mg See admin instructions by mouth. Take 4 tablets after each meal and 2 after each snack   01/21/2017 at Unknown time  . Tetrahydrozoline HCl (VISINE OP) Place 2 drops into both eyes daily as needed (dry eyes).   unkonwn  . warfarin (COUMADIN) 2.5 MG tablet Take 1 tablet daily except 1 1/2 tablets on Sundays, Tuesdays and Thursdays or as directed (Patient taking differently: Take 2.5 mg by mouth See admin instructions. TAKE ONE TABLET ON MONDAYS, TUESDAY,WEDNESDAY,FRIDAY,SATURDAY. TAKE HALF TABLET ON THURSDAY AND SUNDAY) 135 tablet 3 01/20/2017 at 2100  . diltiazem (CARDIZEM CD) 240 MG 24 hr capsule Take 1 capsule (240 mg total) daily by mouth. (Patient not taking: Reported on 01/21/2017) 30 capsule 0 Not Taking at Unknown time   Assessment: 61yo male on Coumadin PTA for h/o afib.  INR therapeutic on admission but has now trending up > 3.    Goal of Therapy:  INR 2-3 Monitor platelets by anticoagulation protocol: Yes   Plan:  HOLD coumadin today, allow INR to trend down.  INR  daily  [place] Crane, Jeaneane Adamec A 01/22/2017,10:34 AM

## 2017-01-22 NOTE — Progress Notes (Signed)
Albert [name]  MRN: [id number]  DOB/AGE: Nov 12, 1955 61 y.o.  Primary Care Physician:Fusco, Purcell Nails, MD  Admit date: 01/21/2017  Chief Complaint:  Chief Complaint  Patient presents with  . Hyperglycemia    S-Pt presented on  01/21/2017 with  Chief Complaint  Patient presents with  . Hyperglycemia  .    Pt offers no new complaints  Meds . allopurinol  100 mg Oral Daily  . carvedilol  12.5 mg Oral BID WC  . cinacalcet  30 mg Oral QHS  . diltiazem  120 mg Oral Daily  . docusate sodium  100 mg Oral Daily  . epoetin (EPOGEN/PROCRIT) injection  8,000 Units Subcutaneous Q T,Th,Sa-HD  . fenofibrate  160 mg Oral QPM  . insulin aspart  0-5 Units Subcutaneous QHS  . insulin aspart  0-9 Units Subcutaneous TID WC  . pantoprazole  40 mg Oral BID AC  . pravastatin  5 mg Oral QPM  . sevelamer carbonate  1,600 mg Oral BID BM  . sevelamer carbonate  3,200 mg Oral TID PC  . sodium chloride flush  3 mL Intravenous Q12H  . Warfarin - Pharmacist Dosing Inpatient   Does not apply Q24H       Physical Exam: Vital signs in last 24 hours: Temp:  [97.8 F (36.6 C)-98.3 F (36.8 C)] 98.3 F (36.8 C) (12/01 0839) Pulse Rate:  [92-130] 124 (12/01 1000) Resp:  [12-35] 20 (12/01 1000) BP: (115-144)/(74-93) 115/74 (12/01 1000) SpO2:  [96 %-100 %] 98 % (12/01 1000) Weight:  [229 lb 11.5 oz (104.2 kg)] 229 lb 11.5 oz (104.2 kg) (12/01 0400) Weight change:  Last BM Date: 01/22/17  Intake/Output from previous day: 11/30 0701 - 12/01 0700 In: 641.9 [P.O.:240; I.V.:51.9; IV Piggyback:350] Out: -  Total I/O In: 403 [P.O.:400; I.V.:3] Out: 1 [Stool:1]   Physical Exam: General- pt is awake,alert, oriented to time place and person Resp- No acute REsp distress, Rhonchi+  CVS- S1S2 regular ij rate and rhythm GIT- BS+, soft, NT, ND EXT- NO LE Edema, Cyanosis Access -left AVF   Lab Results: CBC Recent Labs    01/21/17 1014 01/21/17 1806 01/22/17 0431  WBC 3.5*  --   --   HGB  7.8* 8.2* 8.0*  HCT 25.6* 26.1* 25.2*  PLT 131*  --   --     BMET Recent Labs    01/22/17 0431 01/22/17 0915  NA 135 133*  K 3.4* 3.8  CL 99* 98*  CO2 25 23  GLUCOSE 159* 254*  BUN 46* 47*  CREATININE 8.61* 9.07*  CALCIUM 7.9* 8.2*    MICRO Recent Results (from the past 240 hour(s))  MRSA PCR Screening     Status: None   Collection Time: 01/21/17  4:41 PM  Result Value Ref Range Status   MRSA by PCR NEGATIVE NEGATIVE Final    Comment:        The GeneXpert MRSA Assay (FDA approved for NASAL specimens only), is one component of a comprehensive MRSA colonization surveillance program. It is not intended to diagnose MRSA infection nor to guide or monitor treatment for MRSA infections.       Lab Results  Component Value Date   PTH 350.0 (H) 03/21/2007   CALCIUM 8.2 (L) 01/22/2017   CAION 0.95 (L) 01/18/2017   PHOS 5.9 (H) 01/05/2017               Impression: 1)Renal  ESRD on HD  Pt is on Tuesday/Thursday/Saturday schedule .                Pt will be dialyzed today.   2)HTN  Medication- On Calcium Channel Blockers   3)AnemiaIn ESRD the goal for HGb is9--11. Hgb not at goal Hx of Anemia of chronic ds Hx of recent GI bleed  Will keep on epo   4)CKD Mineral-Bone Disorder Secondary Hyperparathyroidism present. Phosphorusnear togoal. On binders  5)Endo -admitted with uncontrolled DM PrimaryMD following  6)Electrolytes  Normokalemic   Hyponatremic Sec to ESRD  7)Acid base Co2at goal         Plan:  Will dialyze today Will use 2k bath Will keep on epo   Addendum Pt on HD Pt tolerating tx well   Nicko Daher S 01/22/2017, 10:48 AM

## 2017-01-22 NOTE — Progress Notes (Signed)
Discharge instructions reviewed with patient who indicated understanding. Time for questions provided.

## 2017-01-22 NOTE — Progress Notes (Signed)
Patient received dialysis today. 4 hour treatment without complications, tolerated well UF goal met.

## 2017-01-22 NOTE — Discharge Summary (Signed)
Physician Discharge Summary  DAVONE SHINAULT PYP:[id number] DOB: 31-May-1955 DOA: 01/21/2017  PCP: Redmond School, MD  Admit date: 01/21/2017 Discharge date: 01/22/2017  Admitted From: Home Disposition: Home  Recommendations for Outpatient Follow-up:  1. Follow up with PCP in 1-2 weeks 2. Please obtain BMP/CBC in one week 3. Monitor blood sugars 4 times a day 4. Lantus 10 units daily   Home Health: None Equipment/Devices: None  Discharge Condition: Stable CODE STATUS: Full code Diet recommendation: Heart healthy carb modified renal diet  Brief/Interim Summary: GRANTLAND WANT is a 61 y.o. male with medical history significant of diabetes mellitus Type II, ESRD on dialysis T/Th/Saturday presents for hyperglycemia.  Per patient he was recently on metformin which caused significant stomach upset so he was switched to glipizide which he started yesterday. Prior to being metformin he was on Tradjenta.  This morning he woke up and he checked his blood sugar and his meter read "High".  He took a tradjenta he had leftover and two glipizide.  He waited an hour and rechecked and his blood sugar was still high.  No nausea or vomiting. Patient has only felt dizzy since his blood sugars were elevated.  Patient tolerated his dialysis without problem yesterday.  No fevers or chills at home, no chest pain or shortness of breath.  Patient reports that besides his elevated blood sugar he is doing well.  Patient was admitted to the hospital and placed on a glucose stabilizer.  His blood sugars were monitored every hour.  Nephrology was consulted for further management of end-stage renal disease and for his dialysis.  His blood sugar was in the normal ranges on 01/22/2017.  He was transitioned over to Lantus.  Discussion with diabetes coordinator about patient's outpatient regimen ensued.  As his blood glucose was normal at last hospitalization which was less than 1 month ago it is unclear what happened  to cause a spike in his blood sugar.  Will discontinue patient's metformin, glipizide, Tradjenta.  Patient to be discharged on 10 units of Lantus daily.  He is instructed to follow-up with his primary care physician as well as to monitor his blood sugars 4 times a day to ensure a proper trend is visualized by his primary care at time of follow-up.  Patient reports that he has none of his old late his supplies at home and therefore a full set of supplies including pen needles lancets testing strips as well as a Lantus pen was ordered at time of discharge.  Patient denied needing any further instructions as he stated he used to use Lantus years ago.  Patient was stable for discharge on 01/22/2017 for undergoing dialysis.  Discharge Diagnoses:  Principal Problem:   Hyperglycemia Active Problems:   OBSTRUCTIVE SLEEP APNEA   Essential hypertension, benign   Atrial fibrillation (HCC)   ESRD (end stage renal disease) ([place])   Anemia    Discharge Instructions  Discharge Instructions    Call MD for:  difficulty breathing, headache or visual disturbances   Complete by:  As directed    Call MD for:  extreme fatigue   Complete by:  As directed    Call MD for:  hives   Complete by:  As directed    Call MD for:  persistant dizziness or light-headedness   Complete by:  As directed    Call MD for:  persistant nausea and vomiting   Complete by:  As directed    Call MD for:  severe uncontrolled pain  Complete by:  As directed    Call MD for:  temperature >100.4   Complete by:  As directed    Diet - low sodium heart healthy   Complete by:  As directed    Discharge instructions   Complete by:  As directed    Use insulin as instructed See your primary care provider within the next week   Increase activity slowly   Complete by:  As directed      Allergies as of 01/22/2017      Reactions   Metformin And Related Other (See Comments)   Diarrhea, bloody stool, upset stomach.      Medication List     STOP taking these medications   GLIPIZIDE XL 5 MG 24 hr tablet Generic drug:  glipiZIDE   linagliptin 5 MG Tabs tablet Commonly known as:  TRADJENTA     TAKE these medications   allopurinol 100 MG tablet Commonly known as:  ZYLOPRIM Take 100 mg by mouth daily.   blood glucose meter kit and supplies Dispense based on patient and insurance preference. Use up to four times daily as directed. (FOR ICD-9 250.00, 250.01).   CARTIA XT 120 MG 24 hr capsule Generic drug:  diltiazem Take 1 capsule by mouth daily. What changed:  Another medication with the same name was removed. Continue taking this medication, and follow the directions you see here.   carvedilol 25 MG tablet Commonly known as:  COREG take 1/2 tablet two times daily What changed:    how much to take  how to take this  when to take this  additional instructions   cinacalcet 30 MG tablet Commonly known as:  SENSIPAR Take 30 mg by mouth at bedtime.   docusate sodium 100 MG capsule Commonly known as:  COLACE Take 100 mg by mouth daily.   fenofibrate 160 MG tablet Take 160 mg every evening by mouth.   fish oil-omega-3 fatty acids 1000 MG capsule Take 1 g by mouth 3 (three) times daily.   Insulin Glargine 100 UNIT/ML Solostar Pen Commonly known as:  LANTUS SOLOSTAR Inject 10 Units into the skin daily at 10 pm.   Insulin Pen Needle 31G X 5 MM Misc Use as directed   pantoprazole 40 MG tablet Commonly known as:  PROTONIX Take 1 tablet (40 mg total) 2 (two) times daily before a meal by mouth.   pravastatin 10 MG tablet Commonly known as:  PRAVACHOL Take 5 mg by mouth every evening.   sevelamer carbonate 800 MG tablet Commonly known as:  RENVELA Take 1,600-3,200 mg See admin instructions by mouth. Take 4 tablets after each meal and 2 after each snack   VISINE OP Place 2 drops into both eyes daily as needed (dry eyes).   warfarin 2.5 MG tablet Commonly known as:  COUMADIN Take as directed. If you  are unsure how to take this medication, talk to your nurse or doctor. Original instructions:  Take 1 tablet daily except 1 1/2 tablets on Sundays, Tuesdays and Thursdays or as directed What changed:    how much to take  how to take this  when to take this  additional instructions       Allergies  Allergen Reactions  . Metformin And Related Other (See Comments)    Diarrhea, bloody stool, upset stomach.     Consultations:  Nephrology   Procedures/Studies: Mr Abdomen Mrcp Wo Contrast  Result Date: 01/05/2017 CLINICAL DATA:  Abdominal pain and constipation for 2 weeks. EGD today.  GI bleed. Pancreatic adenocarcinoma suspected. History of renal cell carcinoma. EXAM: MRI ABDOMEN WITHOUT CONTRAST  (INCLUDING MRCP) TECHNIQUE: Multiplanar multisequence MR imaging of the abdomen was performed. Heavily T2-weighted images of the biliary and pancreatic ducts were obtained, and three-dimensional MRCP images were rendered by post processing. COMPARISON:  CT of 05/07/2016.  Endoscopy report of earlier today. FINDINGS: Moderate to markedly motion degraded exam throughout. Exam is also degraded by lack of IV contrast. Lower chest: Right hemidiaphragm elevation with probable atelectasis. Moderate cardiomegaly. No pleural fluid. Hepatobiliary: Multiple T2 hyperintense liver lesions are likely cysts. Grossly normal gallbladder, without biliary duct dilatation. Normal common duct caliber, including at 5 mm on image 7/ series 100. Pancreas: Given above limitations, there is no evidence of pancreatic mass or duct dilatation. There is suggestion of peripancreatic edema, including about the tail on image 27/series 15 and the head/ body on image 34/ series 18. No peripancreatic fluid collection. The pancreatic uncinate process is not well evaluated. Spleen:  Iron deposition within. Adrenals/Urinary Tract: Adrenal glands not well evaluated. Right nephrectomy. Multiple left renal lesions of varying complexity, not  well evaluated. Stomach/Bowel: Grossly normal stomach and abdominal bowel loops. Vascular/Lymphatic: Normal caliber of the abdominal aorta and branch vessels. Retroperitoneal nodes not well evaluated. Other:  No gross ascites. Musculoskeletal: No acute osseous abnormality. IMPRESSION: 1. Moderate to markedly motion degraded exam. 2. Given this limitation, no dominant pancreatic mass is seen. Findings suspicious for non complicated pancreatitis. Electronically Signed   By: Abigail Miyamoto M.D.   On: 01/05/2017 17:04   Mr 3d Recon At Scanner  Result Date: 01/05/2017 CLINICAL DATA:  Abdominal pain and constipation for 2 weeks. EGD today. GI bleed. Pancreatic adenocarcinoma suspected. History of renal cell carcinoma. EXAM: MRI ABDOMEN WITHOUT CONTRAST  (INCLUDING MRCP) TECHNIQUE: Multiplanar multisequence MR imaging of the abdomen was performed. Heavily T2-weighted images of the biliary and pancreatic ducts were obtained, and three-dimensional MRCP images were rendered by post processing. COMPARISON:  CT of 05/07/2016.  Endoscopy report of earlier today. FINDINGS: Moderate to markedly motion degraded exam throughout. Exam is also degraded by lack of IV contrast. Lower chest: Right hemidiaphragm elevation with probable atelectasis. Moderate cardiomegaly. No pleural fluid. Hepatobiliary: Multiple T2 hyperintense liver lesions are likely cysts. Grossly normal gallbladder, without biliary duct dilatation. Normal common duct caliber, including at 5 mm on image 7/ series 100. Pancreas: Given above limitations, there is no evidence of pancreatic mass or duct dilatation. There is suggestion of peripancreatic edema, including about the tail on image 27/series 15 and the head/ body on image 34/ series 18. No peripancreatic fluid collection. The pancreatic uncinate process is not well evaluated. Spleen:  Iron deposition within. Adrenals/Urinary Tract: Adrenal glands not well evaluated. Right nephrectomy. Multiple left renal  lesions of varying complexity, not well evaluated. Stomach/Bowel: Grossly normal stomach and abdominal bowel loops. Vascular/Lymphatic: Normal caliber of the abdominal aorta and branch vessels. Retroperitoneal nodes not well evaluated. Other:  No gross ascites. Musculoskeletal: No acute osseous abnormality. IMPRESSION: 1. Moderate to markedly motion degraded exam. 2. Given this limitation, no dominant pancreatic mass is seen. Findings suspicious for non complicated pancreatitis. Electronically Signed   By: Abigail Miyamoto M.D.   On: 01/05/2017 17:04   Dg Chest Port 1 View  Result Date: 01/04/2017 CLINICAL DATA:  Dyspnea, shortness of breath. History of cardiomyopathy, atrial fibrillation end-stage renal disease with right nephrectomy. Former smoker. EXAM: PORTABLE CHEST 1 VIEW COMPARISON:  Chest x-ray of October 14, 2013. FINDINGS: The images obtained in a lordotic  fashion. The right hemidiaphragm is chronically elevated. This limits inflation of the right lung. The left lung is better inflated. The interstitial markings are coarse bilaterally and more conspicuous today. The cardiac silhouette is enlarged. The pulmonary vascularity is more engorged than in the past with some cephalization. There is calcification in the wall of the aortic arch. IMPRESSION: Somewhat limited study due to patient positioning and mild hypoinflation. Mild pulmonary vascular congestion. Stable cardiomegaly. No pleural effusion or pneumonia. Thoracic aortic atherosclerosis. Electronically Signed   By: David  Martinique M.D.   On: 01/04/2017 10:30     Subjective: Patient seen after dialysis.  He is in good spirits and is wanting to go home.  He understands that he will be discharged on Lantus and states he does not need any further instructions on how to use Lantus as he was previously on this years ago.  He is agreeable to stop his other diabetic medications and follow-up with his primary care physician for further monitoring of his blood  glucose.  Discharge Exam: Vitals:   01/22/17 1400 01/22/17 1445  BP: 109/76   Pulse: (!) 118   Resp:    Temp:  (!) 97.4 F (36.3 C)  SpO2:     Vitals:   01/22/17 1030 01/22/17 1200 01/22/17 1400 01/22/17 1445  BP: 111/78  109/76   Pulse: (!) 126  (!) 118   Resp:      Temp:  (!) 97.4 F (36.3 C)  (!) 97.4 F (36.3 C)  TempSrc:  Oral  Oral  SpO2:      Weight:      Height:        General: Pt is alert, awake, not in acute distress Cardiovascular: Irregularly irregular, S1/S2 +, no rubs, no gallops Respiratory: CTA bilaterally, no wheezing, no rhonchi Abdominal: Soft, NT, ND, bowel sounds + Extremities: no edema, no cyanosis, fistula on left upper extremity with palpable thrill    The results of significant diagnostics from this hospitalization (including imaging, microbiology, ancillary and laboratory) are listed below for reference.     Microbiology: Recent Results (from the past 240 hour(s))  MRSA PCR Screening     Status: None   Collection Time: 01/21/17  4:41 PM  Result Value Ref Range Status   MRSA by PCR NEGATIVE NEGATIVE Final    Comment:        The GeneXpert MRSA Assay (FDA approved for NASAL specimens only), is one component of a comprehensive MRSA colonization surveillance program. It is not intended to diagnose MRSA infection nor to guide or monitor treatment for MRSA infections.      Labs: BNP (last 3 results) No results for input(s): BNP in the last 8760 hours. Basic Metabolic Panel: Recent Labs  Lab 01/21/17 1806 01/21/17 2042 01/22/17 0103 01/22/17 0431 01/22/17 0915  NA 132* 133* 135 135 133*  K 3.0* 3.2* 3.3* 3.4* 3.8  CL 95* 98* 101 99* 98*  CO2 _0 GLUCOSE 198* 161* 102* 159* 254*  BUN 41* 41* 45* 46* 47*  CREATININE 8.05* 8.32* 8.52* 8.61* 9.07*  CALCIUM 8.2* 8.1* 8.4* 7.9* 8.2*   Liver Function Tests: No results for input(s): AST, ALT, ALKPHOS, BILITOT, PROT, ALBUMIN in the last 168 hours. No results for  input(s): LIPASE, AMYLASE in the last 168 hours. No results for input(s): AMMONIA in the last 168 hours. CBC: Recent Labs  Lab 01/18/17 1326 01/18/17 1346 01/21/17 1014 01/21/17 1806 01/22/17 0431  WBC 5.1  --  3.5*  --   --  HGB 8.8* 9.9* 7.8* 8.2* 8.0*  HCT 27.4* 29.0* 25.6* 26.1* 25.2*  MCV 94.8  --  97.3  --   --   PLT 172  --  131*  --   --    Cardiac Enzymes: No results for input(s): CKTOTAL, CKMB, CKMBINDEX, TROPONINI in the last 168 hours. BNP: Invalid input(s): POCBNP CBG: Recent Labs  Lab 01/22/17 0213 01/22/17 0446 01/22/17 0755 01/22/17 1212 01/22/17 1428  GLUCAP 119* 143* 205* 160* 149*   D-Dimer No results for input(s): DDIMER in the last 72 hours. Hgb A1c No results for input(s): HGBA1C in the last 72 hours. Lipid Profile No results for input(s): CHOL, HDL, LDLCALC, TRIG, CHOLHDL, LDLDIRECT in the last 72 hours. Thyroid function studies No results for input(s): TSH, T4TOTAL, T3FREE, THYROIDAB in the last 72 hours.  Invalid input(s): FREET3 Anemia work up No results for input(s): VITAMINB12, FOLATE, FERRITIN, TIBC, IRON, RETICCTPCT in the last 72 hours. Urinalysis    Component Value Date/Time   COLORURINE YELLOW 10/12/2013 1045   APPEARANCEUR CLEAR 10/12/2013 1045   LABSPEC 1.015 10/12/2013 1045   PHURINE 8.0 10/12/2013 1045   GLUCOSEU NEGATIVE 10/12/2013 1045   HGBUR MODERATE (A) 10/12/2013 1045   BILIRUBINUR NEGATIVE 10/12/2013 1045   KETONESUR NEGATIVE 10/12/2013 1045   PROTEINUR 30 (A) 10/12/2013 1045   UROBILINOGEN 1.0 10/12/2013 1045   NITRITE NEGATIVE 10/12/2013 1045   LEUKOCYTESUR NEGATIVE 10/12/2013 1045   Sepsis Labs Invalid input(s): PROCALCITONIN,  WBC,  LACTICIDVEN Microbiology Recent Results (from the past 240 hour(s))  MRSA PCR Screening     Status: None   Collection Time: 01/21/17  4:41 PM  Result Value Ref Range Status   MRSA by PCR NEGATIVE NEGATIVE Final    Comment:        The GeneXpert MRSA Assay (FDA approved for  NASAL specimens only), is one component of a comprehensive MRSA colonization surveillance program. It is not intended to diagnose MRSA infection nor to guide or monitor treatment for MRSA infections.      Time coordinating discharge: 35 minutes  SIGNED:   Loretha Stapler, MD  [facility] 01/22/2017, 3:50 PM Pager [phone] If 7PM-7AM, please contact night-coverage [email or web address] Password [email or web address]

## 2017-01-23 DIAGNOSIS — N186 End stage renal disease: Secondary | ICD-10-CM | POA: Diagnosis not present

## 2017-01-23 DIAGNOSIS — Z992 Dependence on renal dialysis: Secondary | ICD-10-CM | POA: Diagnosis not present

## 2017-01-23 LAB — HEPATITIS B SURFACE ANTIGEN: Hepatitis B Surface Ag: NEGATIVE

## 2017-01-25 DIAGNOSIS — Z992 Dependence on renal dialysis: Secondary | ICD-10-CM | POA: Diagnosis not present

## 2017-01-25 DIAGNOSIS — N186 End stage renal disease: Secondary | ICD-10-CM | POA: Diagnosis not present

## 2017-01-26 ENCOUNTER — Ambulatory Visit (INDEPENDENT_AMBULATORY_CARE_PROVIDER_SITE_OTHER): Payer: Medicare HMO | Admitting: *Deleted

## 2017-01-26 DIAGNOSIS — I4891 Unspecified atrial fibrillation: Secondary | ICD-10-CM

## 2017-01-26 DIAGNOSIS — Z5181 Encounter for therapeutic drug level monitoring: Secondary | ICD-10-CM

## 2017-01-26 LAB — POCT INR: INR: 3.2

## 2017-01-27 DIAGNOSIS — N186 End stage renal disease: Secondary | ICD-10-CM | POA: Diagnosis not present

## 2017-01-27 DIAGNOSIS — Z992 Dependence on renal dialysis: Secondary | ICD-10-CM | POA: Diagnosis not present

## 2017-01-29 DIAGNOSIS — N186 End stage renal disease: Secondary | ICD-10-CM | POA: Diagnosis not present

## 2017-01-29 DIAGNOSIS — Z992 Dependence on renal dialysis: Secondary | ICD-10-CM | POA: Diagnosis not present

## 2017-02-01 DIAGNOSIS — Z992 Dependence on renal dialysis: Secondary | ICD-10-CM | POA: Diagnosis not present

## 2017-02-01 DIAGNOSIS — N186 End stage renal disease: Secondary | ICD-10-CM | POA: Diagnosis not present

## 2017-02-02 DIAGNOSIS — Z6828 Body mass index (BMI) 28.0-28.9, adult: Secondary | ICD-10-CM | POA: Diagnosis not present

## 2017-02-02 DIAGNOSIS — N186 End stage renal disease: Secondary | ICD-10-CM | POA: Diagnosis not present

## 2017-02-02 DIAGNOSIS — I10 Essential (primary) hypertension: Secondary | ICD-10-CM | POA: Diagnosis not present

## 2017-02-02 DIAGNOSIS — E1129 Type 2 diabetes mellitus with other diabetic kidney complication: Secondary | ICD-10-CM | POA: Diagnosis not present

## 2017-02-03 DIAGNOSIS — Z992 Dependence on renal dialysis: Secondary | ICD-10-CM | POA: Diagnosis not present

## 2017-02-03 DIAGNOSIS — N186 End stage renal disease: Secondary | ICD-10-CM | POA: Diagnosis not present

## 2017-02-05 DIAGNOSIS — N186 End stage renal disease: Secondary | ICD-10-CM | POA: Diagnosis not present

## 2017-02-05 DIAGNOSIS — Z992 Dependence on renal dialysis: Secondary | ICD-10-CM | POA: Diagnosis not present

## 2017-02-08 DIAGNOSIS — N186 End stage renal disease: Secondary | ICD-10-CM | POA: Diagnosis not present

## 2017-02-08 DIAGNOSIS — Z992 Dependence on renal dialysis: Secondary | ICD-10-CM | POA: Diagnosis not present

## 2017-02-10 DIAGNOSIS — Z992 Dependence on renal dialysis: Secondary | ICD-10-CM | POA: Diagnosis not present

## 2017-02-10 DIAGNOSIS — N186 End stage renal disease: Secondary | ICD-10-CM | POA: Diagnosis not present

## 2017-02-12 DIAGNOSIS — Z992 Dependence on renal dialysis: Secondary | ICD-10-CM | POA: Diagnosis not present

## 2017-02-12 DIAGNOSIS — N186 End stage renal disease: Secondary | ICD-10-CM | POA: Diagnosis not present

## 2017-02-14 DIAGNOSIS — N186 End stage renal disease: Secondary | ICD-10-CM | POA: Diagnosis not present

## 2017-02-14 DIAGNOSIS — Z992 Dependence on renal dialysis: Secondary | ICD-10-CM | POA: Diagnosis not present

## 2017-02-16 ENCOUNTER — Ambulatory Visit (INDEPENDENT_AMBULATORY_CARE_PROVIDER_SITE_OTHER): Payer: Medicare HMO | Admitting: *Deleted

## 2017-02-16 DIAGNOSIS — Z5181 Encounter for therapeutic drug level monitoring: Secondary | ICD-10-CM | POA: Diagnosis not present

## 2017-02-16 DIAGNOSIS — I4891 Unspecified atrial fibrillation: Secondary | ICD-10-CM

## 2017-02-16 LAB — POCT INR: INR: 1.3

## 2017-02-17 DIAGNOSIS — N186 End stage renal disease: Secondary | ICD-10-CM | POA: Diagnosis not present

## 2017-02-17 DIAGNOSIS — Z992 Dependence on renal dialysis: Secondary | ICD-10-CM | POA: Diagnosis not present

## 2017-02-19 DIAGNOSIS — N186 End stage renal disease: Secondary | ICD-10-CM | POA: Diagnosis not present

## 2017-02-19 DIAGNOSIS — Z992 Dependence on renal dialysis: Secondary | ICD-10-CM | POA: Diagnosis not present

## 2017-02-21 ENCOUNTER — Other Ambulatory Visit: Payer: Self-pay | Admitting: Cardiology

## 2017-02-21 DIAGNOSIS — Z992 Dependence on renal dialysis: Secondary | ICD-10-CM | POA: Diagnosis not present

## 2017-02-21 DIAGNOSIS — N186 End stage renal disease: Secondary | ICD-10-CM | POA: Diagnosis not present

## 2017-02-22 DIAGNOSIS — Z992 Dependence on renal dialysis: Secondary | ICD-10-CM | POA: Diagnosis not present

## 2017-02-22 DIAGNOSIS — N186 End stage renal disease: Secondary | ICD-10-CM | POA: Diagnosis not present

## 2017-02-24 ENCOUNTER — Telehealth: Payer: Self-pay

## 2017-02-24 DIAGNOSIS — Z992 Dependence on renal dialysis: Secondary | ICD-10-CM | POA: Diagnosis not present

## 2017-02-24 DIAGNOSIS — N186 End stage renal disease: Secondary | ICD-10-CM | POA: Diagnosis not present

## 2017-02-24 NOTE — Telephone Encounter (Signed)
Received a call from Ann Lions, PA from [facility]. PA see's pt and feels he needs to be evaluated ASAP due to abdomen swelling. Pts abdomen continues to swell and Ann Lions is concerned with the hx of cancer pt had. contcat number for Ann Lions is [phone]. Making appointment for 02/25/2017 @ 11:00AM with [facility].

## 2017-02-25 ENCOUNTER — Ambulatory Visit: Payer: Medicare HMO | Admitting: Gastroenterology

## 2017-02-25 NOTE — Telephone Encounter (Signed)
I called the patient to reschedule his procedure, because the provider he was seeing today is out with a sick child.  I offered the patient an appt to see  RMR on next Tuesday, January 8th, however he has dialysis on that day and can't come in.  He is now scheduled to see Randall Hiss on 1/18.    Patient states that he feels fine and if he has any further problems he will call us or the doctor on call.  Routing to [employer] as a FYI.

## 2017-02-26 DIAGNOSIS — N186 End stage renal disease: Secondary | ICD-10-CM | POA: Diagnosis not present

## 2017-02-26 DIAGNOSIS — Z992 Dependence on renal dialysis: Secondary | ICD-10-CM | POA: Diagnosis not present

## 2017-02-28 ENCOUNTER — Ambulatory Visit (INDEPENDENT_AMBULATORY_CARE_PROVIDER_SITE_OTHER): Payer: Medicare HMO | Admitting: *Deleted

## 2017-02-28 DIAGNOSIS — Z5181 Encounter for therapeutic drug level monitoring: Secondary | ICD-10-CM

## 2017-02-28 DIAGNOSIS — I4891 Unspecified atrial fibrillation: Secondary | ICD-10-CM | POA: Diagnosis not present

## 2017-02-28 LAB — POCT INR: INR: 1.7

## 2017-02-28 NOTE — Patient Instructions (Signed)
Take coumadin 1 1/2 tablets tonight then increase dose to 1 tablet daily except 1/2 tablet on Sunday and Thursdays Recheck in 2 weeks

## 2017-03-01 ENCOUNTER — Ambulatory Visit: Payer: Medicare HMO | Admitting: Nurse Practitioner

## 2017-03-01 DIAGNOSIS — N186 End stage renal disease: Secondary | ICD-10-CM | POA: Diagnosis not present

## 2017-03-01 DIAGNOSIS — Z992 Dependence on renal dialysis: Secondary | ICD-10-CM | POA: Diagnosis not present

## 2017-03-01 NOTE — Telephone Encounter (Signed)
Noted, thanks for the [place].

## 2017-03-03 DIAGNOSIS — Z992 Dependence on renal dialysis: Secondary | ICD-10-CM | POA: Diagnosis not present

## 2017-03-03 DIAGNOSIS — N186 End stage renal disease: Secondary | ICD-10-CM | POA: Diagnosis not present

## 2017-03-05 DIAGNOSIS — Z992 Dependence on renal dialysis: Secondary | ICD-10-CM | POA: Diagnosis not present

## 2017-03-05 DIAGNOSIS — N186 End stage renal disease: Secondary | ICD-10-CM | POA: Diagnosis not present

## 2017-03-08 DIAGNOSIS — E119 Type 2 diabetes mellitus without complications: Secondary | ICD-10-CM | POA: Diagnosis not present

## 2017-03-08 DIAGNOSIS — N186 End stage renal disease: Secondary | ICD-10-CM | POA: Diagnosis not present

## 2017-03-08 DIAGNOSIS — Z992 Dependence on renal dialysis: Secondary | ICD-10-CM | POA: Diagnosis not present

## 2017-03-10 DIAGNOSIS — N186 End stage renal disease: Secondary | ICD-10-CM | POA: Diagnosis not present

## 2017-03-10 DIAGNOSIS — Z992 Dependence on renal dialysis: Secondary | ICD-10-CM | POA: Diagnosis not present

## 2017-03-11 ENCOUNTER — Encounter: Payer: Self-pay | Admitting: Nurse Practitioner

## 2017-03-11 ENCOUNTER — Encounter: Payer: Self-pay | Admitting: *Deleted

## 2017-03-11 ENCOUNTER — Ambulatory Visit: Payer: Medicare HMO | Admitting: Nurse Practitioner

## 2017-03-11 VITALS — BP 123/81 | HR 112 | Temp 97.4°F | Ht 74.0 in | Wt 225.2 lb

## 2017-03-11 DIAGNOSIS — K5900 Constipation, unspecified: Secondary | ICD-10-CM

## 2017-03-11 DIAGNOSIS — R1900 Intra-abdominal and pelvic swelling, mass and lump, unspecified site: Secondary | ICD-10-CM | POA: Diagnosis not present

## 2017-03-11 DIAGNOSIS — R1084 Generalized abdominal pain: Secondary | ICD-10-CM | POA: Diagnosis not present

## 2017-03-11 DIAGNOSIS — R109 Unspecified abdominal pain: Secondary | ICD-10-CM | POA: Insufficient documentation

## 2017-03-11 NOTE — Assessment & Plan Note (Signed)
Constipation currently well managed on fiber supplement.  Recommend he continue this.  Follow-up as needed.

## 2017-03-11 NOTE — Progress Notes (Signed)
Referring Provider: Redmond School, MD Primary Care Physician:  Redmond School, MD Primary GI:  Dr. [name]  Chief Complaint  Patient presents with  . abdominal swelling    dialysis nurse concerned    HPI:   Albert [name] is a 62 y.o. male who presents in referral from dialysis center for abdominal swelling in the setting of history of renal cell carcinoma. The patient was last seen in our office 03/03/2016 for constipation and rectal bleeding.  History of end-stage renal disease on hemodialysis.  Colonoscopy up-to-date 01/07/2016 with hemorrhoids otherwise normal.  Recommended fiber, Anusol suppositories, repeat colonoscopy in 5 years (2022).  At his last visit he was doing well, still on Coumadin and no further rectal bleeding.  Constipation improved significantly on Benefiber.  Rectal cream is difficult to use and insurance would not cover suppositories.  Per patient request, another prescription for suppositories was sent to the pharmacy.  Recommended continue fiber supplement, follow-up as needed.  Patient was recently admitted from 01/03/2017 through 01/06/2017 for GI bleed.  He presented with generalized weakness, nausea, vomiting, diarrhea for 1 week.  Noted hematochezia and melanotic stools for the previous week.  No hematemesis.  The emergency department his blood pressures were soft, hemoglobin 11.4, INR was greater than 10.0.  IFobT was positive.  EGD was completed 01/05/2017 and outlined as per below.  MRI with no dominant pancreatic mass seen other findings suspicious for non-complicated pancreatitis.  His hemoglobin got as low as 10.7, he did not require transfusion.  Recommended follow-up on gastric biopsies and unless biopsy is significantly revealing patient will need an EUS in 6 weeks to evaluate abnormal EGD findings.  The patient was agreeable.  EGD 01/05/2017 which found normal esophagus, diffuse submucosal gastric petechiae status post biopsy, multiple abnormalities  involving the duodenum.  Concerning for underlying malignancy.  Upper GI bleed deemed likely due to hemobilia or Hemosucus Pancreaticus.  Recommended MRI follow-up.  Surgical pathology found the biopsies to be foveolar glands hyperplasia and fundic gland polyp.  No H. pylori.  No dysplasia or malignancy.  Does not appear the EUS was completed.  Today he states he's doing very well overall. Tolerating HD well, energy is good. States he was told by the HD nurse that his abdomen looked swollen and they were concerned that there may be some fluid in there that they're not able to pull off. He states his abdomen doesn't feel like his abdomen is swollen "it's been this big for a while." Does have some intermittent abdominal soreness. Constipation is doing well on fiber. Abdominal soreness about once a week and lasts about a day, described as cramping. Feels it's triggered by the drink he takes for dialysis (Nephro supplement). Deneis GERD symptoms, hematochezia, melena, fever, chills, unintentional weight loss, unintentional weight gain. Denies chest pain, dyspnea, dizziness, lightheadedness, syncope, near syncope. Denies any other upper or lower GI symptoms.  Previous RCC, patient states has been in remission.  Past Medical History:  Diagnosis Date  . Anemia   . Atrial fibrillation ([place])   . Cardiomyopathy    LVEF 50-55% 8/11 - [facility]  . Colonic polyp   . Diverticulosis of colon   . ESRD on hemodialysis ([place])    Dr. Lowanda Foster [facility]  . Essential hypertension, benign   . Gout   . Mixed hyperlipidemia   . OSA (obstructive sleep apnea)    CPAP  . Renal cell cancer ([place])   . Type 2 diabetes mellitus (H[facility])    Type  II    Past Surgical History:  Procedure Laterality Date  . BIOPSY  01/05/2017   Procedure: BIOPSY;  Surgeon: Daneil Dolin, MD;  [facility];  Service: Endoscopy;;  gastric  . COLONOSCOPY N/A 01/07/2016   Dr. [name]: Grade 1 internal hemorrhoids, otherwise normal  .  COLONOSCOPY W/ POLYPECTOMY  2009, 2012   2009: Dr. [name]: 1.25 cm adenomatous polyp without high grade dysplasia, distal to IC valve. 2012: normal rectum, few pancolonic diverticula, single diminutive hyperplastic polyp  . ESOPHAGOGASTRODUODENOSCOPY  2009   Dr. [name]: accentuated undulating Z-lin with couple of islands of salmon-colored epithelium suspicious for Barrett's, s/p biopsy with path revealing mild inflammation, no metaplasia, dysplasia, or malignancy  . ESOPHAGOGASTRODUODENOSCOPY N/A 01/05/2017   Procedure: ESOPHAGOGASTRODUODENOSCOPY (EGD);  Surgeon: Daneil Dolin, MD;  [facility];  Service: Endoscopy;  Laterality: N/A;  . FISTULA SUPERFICIALIZATION Left 03/31/2015   Procedure: CEPHALIC VEIN TURNDOWN; PLICATION OF BRACHIOCEPHALIC AVF;  Surgeon: Conrad Rhinecliff, MD;  Location: [facility];  Service: Vascular;  Laterality: Left;  . FISTULOGRAM N/A 10/19/2013   Procedure: FISTULOGRAM;  Surgeon: Rosetta Posner, MD;  Location: [facility] CATH LAB;  Service: Cardiovascular;  Laterality: N/A;  . Left arm AV fistula  2009   Dr. [name]  . PERIPHERAL VASCULAR CATHETERIZATION N/A 03/10/2015   Procedure: Fistulagram;  Surgeon: Conrad Starks, MD;  Location: [facility] CV LAB;  Service: Cardiovascular;  Laterality: N/A;  . Right nephrectomy      Current Outpatient Medications  Medication Sig Dispense Refill  . allopurinol (ZYLOPRIM) 100 MG tablet Take 100 mg by mouth daily.     . blood glucose meter kit and supplies Dispense based on patient and insurance preference. Use up to four times daily as directed. (FOR ICD-9 250.00, 250.01). 1 each 0  . CARTIA XT 120 MG 24 hr capsule Take 1 capsule by mouth daily.    . carvedilol (COREG) 25 MG tablet take 1/2 tablet two times daily (Patient taking differently: Take 12.5 mg by mouth 2 (two) times daily with a meal. take 1/2 tablet two times daily) 180 tablet 3  . cinacalcet (SENSIPAR) 30 MG tablet Take 30 mg by mouth at bedtime.     . docusate sodium (COLACE)  100 MG capsule Take 100 mg by mouth daily.    . fenofibrate 160 MG tablet Take 160 mg every evening by mouth.     . fish oil-omega-3 fatty acids 1000 MG capsule Take 1 g by mouth 3 (three) times daily.     Marland Kitchen glipiZIDE (GLUCOTROL XL) 5 MG 24 hr tablet Take 1 tablet by mouth daily.    . Insulin Glargine (LANTUS SOLOSTAR) 100 UNIT/ML Solostar Pen Inject 10 Units into the skin daily at 10 pm. 15 mL 0  . Insulin Pen Needle 31G X 5 MM MISC Use as directed 30 each 0  . pravastatin (PRAVACHOL) 10 MG tablet Take 5 mg by mouth every evening.     . sevelamer carbonate (RENVELA) 800 MG tablet Take 1,600-3,200 mg See admin instructions by mouth. Take 4 tablets after each meal and 2 after each snack    . Tetrahydrozoline HCl (VISINE OP) Place 2 drops into both eyes daily as needed (dry eyes).    . warfarin (COUMADIN) 2.5 MG tablet TAKE 1 TABLET EVERY DAY EXCEPT TAKE  1/2 TABLET ON SUNDAYS, TUESDAYS AND THURSDAYS OR AS DIRECTED (Patient taking differently: TAKE 1 TABLET EVERY DAY EXCEPT TAKE  1/2 TABLET ON SUNDAYS AND THURSDAYS OR  AS DIRECTED) 135 tablet 3  . Wheat Dextrin (BENEFIBER DRINK MIX PO) Take by mouth daily. 1 tbsp     No current facility-administered medications for this visit.     Allergies as of 03/11/2017 - Review Complete 03/11/2017  Allergen Reaction Noted  . Metformin and related Other (See Comments) 01/18/2017    Family History  Problem Relation Age of Onset  . Hypertension Mother   . Hypertension Unknown        Siblings  . Diabetes type II Unknown        Siblings  . Colon cancer Neg Hx     Social History   Socioeconomic History  . Marital status: Single    Spouse name: None  . Number of children: None  . Years of education: None  . Highest education level: None  Social Needs  . Financial resource strain: None  . Food insecurity - worry: None  . Food insecurity - inability: None  . Transportation needs - medical: None  . Transportation needs - non-medical: None    Occupational History  . None  Tobacco Use  . Smoking status: Former Smoker    Types: Cigarettes    Last attempt to quit: 07/04/1998    Years since quitting: 18.6  . Smokeless tobacco: Never Used  . Tobacco comment: Quit May 2000  Substance and Sexual Activity  . Alcohol use: No    Alcohol/week: 0.0 oz    Comment: Quit 2014; Previously drank about 3 drinks a day  . Drug use: No  . Sexual activity: None  Other Topics Concern  . None  Social History Narrative  . None    Review of Systems: General: Negative for anorexia, weight loss, fever, chills, fatigue, weakness. ENT: Negative for hoarseness, difficulty swallowing. CV: Negative for chest pain, angina, palpitations, peripheral edema.  Respiratory: Negative for dyspnea at rest, cough, sputum, wheezing.  GI: See history of present illness. Endo: Negative for unusual weight change.  Heme: Negative for bruising or bleeding.   Physical Exam: BP 123/81   Pulse (!) 112   Temp (!) 97.4 F (36.3 C) (Oral)   Ht _0  (1.88 m)   Wt 225 lb 3.2 oz (102.2 kg)   BMI 28.91 kg/m  General:   Alert and oriented. Pleasant and cooperative. Well-nourished and well-developed.  Eyes:  Without icterus, sclera clear and conjunctiva pink.  Ears:  Normal auditory acuity. Cardiovascular:  S1, S2 present without murmurs appreciated. Extremities without clubbing or edema. Respiratory:  Clear to auscultation bilaterally. No wheezes, rales, or rhonchi. No distress.  Gastrointestinal:  +BS, soft, non-tender and non-distended. No HSM noted. No guarding or rebound. No masses appreciated. Noted some flank dullness. Rectal:  Deferred  Musculoskalatal:  Symmetrical without gross deformities. Neurologic:  Alert and oriented x4;  grossly normal neurologically. Psych:  Alert and cooperative. Normal mood and affect. Heme/Lymph/Immune: No excessive bruising noted.    03/11/2017 11:17 AM   Disclaimer: This note was dictated with voice recognition  software. Similar sounding words can inadvertently be transcribed and may not be corrected upon review.

## 2017-03-11 NOTE — Patient Instructions (Signed)
1. We will help schedule your ultrasound of your abdomen. 2. Further recommendations depending on the results of your ultrasound. 3. Return for follow-up in 2 months. 4. Call if you have any questions or concerns.

## 2017-03-11 NOTE — Assessment & Plan Note (Addendum)
The patient was referred to [place] by hemodialysis center for what they perceived his abdominal swelling.  The patient feels his abdomen is no more swollen/big than it has been over the past several years.  On exam he does not have any tenseness concerning for tense ascites.  He does have some flank dullness.  He is at risk for fluid retention due to end-stage renal disease.  He does have a history of renal cell carcinoma, although he states this is been in remission.  At this point we will check an abdominal ultrasound for any ascites.  If there is obvious ascites we can plan for possible paracentesis with cytology and other labs.  Follow-up in 2 months.  Pending the results of his ultrasound we can also plan to schedule EUS as previously recommended during his hospitalization stay.

## 2017-03-11 NOTE — Assessment & Plan Note (Signed)
Describes intermittent, mild, lower abdominal pain.  He notices this typically after he drinks his renal supplement (Nepro) which is similar to Ensure.  He wonders if he has may be a mild lactose intolerance.  Overall his symptoms do not seem very concerning to him.  We will continue to monitor, follow-up in 2 months.

## 2017-03-12 DIAGNOSIS — N186 End stage renal disease: Secondary | ICD-10-CM | POA: Diagnosis not present

## 2017-03-12 DIAGNOSIS — Z992 Dependence on renal dialysis: Secondary | ICD-10-CM | POA: Diagnosis not present

## 2017-03-14 ENCOUNTER — Ambulatory Visit (INDEPENDENT_AMBULATORY_CARE_PROVIDER_SITE_OTHER): Payer: Medicare HMO | Admitting: *Deleted

## 2017-03-14 ENCOUNTER — Ambulatory Visit (HOSPITAL_COMMUNITY)
Admission: RE | Admit: 2017-03-14 | Discharge: 2017-03-14 | Disposition: A | Discharge status: Home / Self Care | Payer: Medicare HMO | Source: Ambulatory Visit | Attending: Nurse Practitioner | Admitting: Nurse Practitioner

## 2017-03-14 DIAGNOSIS — K5900 Constipation, unspecified: Secondary | ICD-10-CM | POA: Diagnosis not present

## 2017-03-14 DIAGNOSIS — R1900 Intra-abdominal and pelvic swelling, mass and lump, unspecified site: Secondary | ICD-10-CM

## 2017-03-14 DIAGNOSIS — K8020 Calculus of gallbladder without cholecystitis without obstruction: Secondary | ICD-10-CM | POA: Insufficient documentation

## 2017-03-14 DIAGNOSIS — I4891 Unspecified atrial fibrillation: Secondary | ICD-10-CM

## 2017-03-14 DIAGNOSIS — Z992 Dependence on renal dialysis: Secondary | ICD-10-CM | POA: Insufficient documentation

## 2017-03-14 DIAGNOSIS — K7689 Other specified diseases of liver: Secondary | ICD-10-CM | POA: Diagnosis not present

## 2017-03-14 DIAGNOSIS — N186 End stage renal disease: Secondary | ICD-10-CM | POA: Insufficient documentation

## 2017-03-14 DIAGNOSIS — N281 Cyst of kidney, acquired: Secondary | ICD-10-CM | POA: Insufficient documentation

## 2017-03-14 DIAGNOSIS — Z5181 Encounter for therapeutic drug level monitoring: Secondary | ICD-10-CM | POA: Diagnosis not present

## 2017-03-14 DIAGNOSIS — R1084 Generalized abdominal pain: Secondary | ICD-10-CM

## 2017-03-14 LAB — POCT INR: INR: 2.6

## 2017-03-14 NOTE — Progress Notes (Signed)
cc'd to pcp 

## 2017-03-14 NOTE — Patient Instructions (Signed)
Continue coumadin 1 tablet daily except 1/2 tablet on Sunday and Thursdays Recheck in 3 weeks

## 2017-03-15 DIAGNOSIS — Z992 Dependence on renal dialysis: Secondary | ICD-10-CM | POA: Diagnosis not present

## 2017-03-15 DIAGNOSIS — N186 End stage renal disease: Secondary | ICD-10-CM | POA: Diagnosis not present

## 2017-03-17 DIAGNOSIS — N186 End stage renal disease: Secondary | ICD-10-CM | POA: Diagnosis not present

## 2017-03-17 DIAGNOSIS — Z992 Dependence on renal dialysis: Secondary | ICD-10-CM | POA: Diagnosis not present

## 2017-03-19 DIAGNOSIS — N186 End stage renal disease: Secondary | ICD-10-CM | POA: Diagnosis not present

## 2017-03-19 DIAGNOSIS — Z992 Dependence on renal dialysis: Secondary | ICD-10-CM | POA: Diagnosis not present

## 2017-03-22 ENCOUNTER — Other Ambulatory Visit: Payer: Self-pay | Admitting: Nurse Practitioner

## 2017-03-22 DIAGNOSIS — N186 End stage renal disease: Secondary | ICD-10-CM | POA: Diagnosis not present

## 2017-03-22 DIAGNOSIS — R1011 Right upper quadrant pain: Secondary | ICD-10-CM

## 2017-03-22 DIAGNOSIS — Z992 Dependence on renal dialysis: Secondary | ICD-10-CM | POA: Diagnosis not present

## 2017-03-22 DIAGNOSIS — K8020 Calculus of gallbladder without cholecystitis without obstruction: Secondary | ICD-10-CM

## 2017-03-22 NOTE — Progress Notes (Signed)
Please forward this order for scheduling. Thanks.

## 2017-03-23 ENCOUNTER — Telehealth: Payer: Self-pay | Admitting: *Deleted

## 2017-03-23 NOTE — Telephone Encounter (Signed)
Spoke with pt and is aware HIDA scan scheduled for 03/28/17 at 10:00am, NPO after midnight, no pain medication after midnight. Checked [employer] website and no PA is required.

## 2017-03-24 ENCOUNTER — Telehealth: Payer: Self-pay | Admitting: Nurse Practitioner

## 2017-03-24 DIAGNOSIS — R198 Other specified symptoms and signs involving the digestive system and abdomen: Secondary | ICD-10-CM

## 2017-03-24 DIAGNOSIS — N186 End stage renal disease: Secondary | ICD-10-CM | POA: Diagnosis not present

## 2017-03-24 DIAGNOSIS — Z992 Dependence on renal dialysis: Secondary | ICD-10-CM | POA: Diagnosis not present

## 2017-03-24 NOTE — Telephone Encounter (Signed)
It appears the patient never had EUS as recommended during his EGD due to abnormal gastric mucosa. Can we refer patient to Dr. Ardis Hughs in [place] for EUS?  Thanks

## 2017-03-25 DIAGNOSIS — Z992 Dependence on renal dialysis: Secondary | ICD-10-CM | POA: Diagnosis not present

## 2017-03-25 DIAGNOSIS — N186 End stage renal disease: Secondary | ICD-10-CM | POA: Diagnosis not present

## 2017-03-25 NOTE — Telephone Encounter (Signed)
Please schedule appointment.

## 2017-03-26 DIAGNOSIS — N186 End stage renal disease: Secondary | ICD-10-CM | POA: Diagnosis not present

## 2017-03-26 DIAGNOSIS — Z992 Dependence on renal dialysis: Secondary | ICD-10-CM | POA: Diagnosis not present

## 2017-03-28 ENCOUNTER — Encounter (HOSPITAL_COMMUNITY)
Admission: RE | Admit: 2017-03-28 | Discharge: 2017-03-28 | Disposition: A | Discharge status: Home / Self Care | Payer: Medicare HMO | Source: Ambulatory Visit | Attending: Nurse Practitioner | Admitting: Nurse Practitioner

## 2017-03-28 ENCOUNTER — Encounter (HOSPITAL_COMMUNITY): Payer: Self-pay

## 2017-03-28 DIAGNOSIS — R1011 Right upper quadrant pain: Secondary | ICD-10-CM | POA: Insufficient documentation

## 2017-03-28 DIAGNOSIS — K8020 Calculus of gallbladder without cholecystitis without obstruction: Secondary | ICD-10-CM | POA: Insufficient documentation

## 2017-03-28 MED ORDER — STERILE WATER FOR INJECTION IJ SOLN
INTRAMUSCULAR | Status: AC
  Administered 2017-03-28: 2.05 mL
  Filled 2017-03-28: qty 10

## 2017-03-28 MED ORDER — SODIUM CHLORIDE 0.9% FLUSH
INTRAVENOUS | Status: AC
  Filled 2017-03-28: qty 40

## 2017-03-28 MED ORDER — TECHNETIUM TC 99M MEBROFENIN IV KIT
5.0000 | PACK | Freq: Once | INTRAVENOUS | Status: AC | PRN
  Administered 2017-03-28: 5 via INTRAVENOUS

## 2017-03-28 MED ORDER — SINCALIDE 5 MCG IJ SOLR
INTRAMUSCULAR | Status: AC
  Administered 2017-03-28: 2.05 ug
  Filled 2017-03-28: qty 5

## 2017-03-28 NOTE — Addendum Note (Signed)
Addended by: Inge Rise on: 03/28/2017 08:27 AM   Modules accepted: Orders

## 2017-03-28 NOTE — Telephone Encounter (Signed)
Referral has been sent.

## 2017-03-29 ENCOUNTER — Telehealth: Payer: Self-pay

## 2017-03-29 DIAGNOSIS — Z992 Dependence on renal dialysis: Secondary | ICD-10-CM | POA: Diagnosis not present

## 2017-03-29 DIAGNOSIS — N186 End stage renal disease: Secondary | ICD-10-CM | POA: Diagnosis not present

## 2017-03-29 NOTE — Telephone Encounter (Signed)
Albert Banister, MD  Jeoffrey Massed, RN        I reviewed his epic notes. He needs upper EUS, radial +/- linear for abnormal stomach on recent Dr. [name] EGD, +MAC sedation, next available.   [email or web address]   Previous Messages

## 2017-03-31 ENCOUNTER — Other Ambulatory Visit: Payer: Self-pay

## 2017-03-31 DIAGNOSIS — R933 Abnormal findings on diagnostic imaging of other parts of digestive tract: Secondary | ICD-10-CM

## 2017-03-31 DIAGNOSIS — Z992 Dependence on renal dialysis: Secondary | ICD-10-CM | POA: Diagnosis not present

## 2017-03-31 DIAGNOSIS — N186 End stage renal disease: Secondary | ICD-10-CM | POA: Diagnosis not present

## 2017-03-31 NOTE — Telephone Encounter (Signed)
Left message on machine to call back  Letter sent to Dr Domenic Polite for coumadin clearance.

## 2017-03-31 NOTE — Telephone Encounter (Signed)
OK to hold coumadin 5 days before procedure. Take last dose on 04/15/17 and resume coumadin the night of procedure./  Thanks,  Lattie Haw  ----- Message -----  From: [name], MD  Sent: 03/31/2017  3:44 PM  To: Malen Gauze, RN

## 2017-03-31 NOTE — Telephone Encounter (Signed)
The pt has been scheduled for EUS on 04/21/17 at 1030 am [facility]

## 2017-04-01 ENCOUNTER — Other Ambulatory Visit: Payer: Self-pay | Admitting: *Deleted

## 2017-04-01 DIAGNOSIS — K8080 Other cholelithiasis without obstruction: Secondary | ICD-10-CM

## 2017-04-01 NOTE — Progress Notes (Signed)
amb  

## 2017-04-01 NOTE — Telephone Encounter (Signed)
EUS scheduled, pt instructed and medications reviewed.  Patient instructions mailed to home.  Patient to call with any questions or concerns.  

## 2017-04-02 DIAGNOSIS — N186 End stage renal disease: Secondary | ICD-10-CM | POA: Diagnosis not present

## 2017-04-02 DIAGNOSIS — Z992 Dependence on renal dialysis: Secondary | ICD-10-CM | POA: Diagnosis not present

## 2017-04-05 ENCOUNTER — Encounter: Payer: Self-pay | Admitting: General Surgery

## 2017-04-05 ENCOUNTER — Ambulatory Visit: Payer: Medicare HMO | Admitting: General Surgery

## 2017-04-05 DIAGNOSIS — Z992 Dependence on renal dialysis: Secondary | ICD-10-CM | POA: Diagnosis not present

## 2017-04-05 DIAGNOSIS — K8020 Calculus of gallbladder without cholecystitis without obstruction: Secondary | ICD-10-CM | POA: Insufficient documentation

## 2017-04-05 DIAGNOSIS — K828 Other specified diseases of gallbladder: Secondary | ICD-10-CM | POA: Diagnosis not present

## 2017-04-05 DIAGNOSIS — N186 End stage renal disease: Secondary | ICD-10-CM | POA: Diagnosis not present

## 2017-04-05 MED ORDER — PANTOPRAZOLE SODIUM 40 MG PO TBEC: 40.0000 mg | DELAYED_RELEASE_TABLET | Freq: Every day | ORAL | 1 refills | Status: AC

## 2017-04-05 NOTE — H&P (View-Only) (Signed)
[facility] History and Physical  Reason for Referral: Gallstones, dyskinesia  Referring Physician:  Eric Gill NP  Chief Complaint    Cholelithiasis      Albert [name] is a 61 y.o. male.  HPI: Albert [name] is a 61 yo with multiple medical issues including A fib on coumadin, ESRD on dialysis, T/Th/Sat, prior renal cell cancer, who was referred to GI for abdominal swelling by his dialysis center due to concerns that he had fluid retention.  He reports that he feels like his abdomen is about the same as normal, and he does not feel swollen. He does have some complaints of some nausea/ and spitting up/ vomiting bitter clear liquid in the morning. He says that when he was on protonix this was better, and that he has run out of the prescription since his discharge in 12/2016.   He was admitted 12/2016 with an UGI and had an EGD that showed concern for multiple erosive areas in the stomach and duodenum and concern for extrinsic compression in the duodenum. He underwent an MRI that was of poor quality but did not see any mass, but suspected pancreatitis.  He has been seen by GI since that time and worked up for the bloating and abdominal swelling and they were concerned for ascites but US revealed stones and a HIDA was also done that showed filling of the gallbladder but a 0 EF after CCK.  He has no RUQ pain and maybe some vague left sided pain.   GI wants him to get an EUS and this has yet to be done. He also has not had any CT scan of the abdomen.   He has a history of right renal cell carcinoma, and reports that he follows with his urologist annually. He says he has had CT scans? But these are not in our system.    Past Medical History:  Diagnosis Date  . Anemia   . Atrial fibrillation ([facility])   . Cardiomyopathy    LVEF 50-55% 8/11 - [facility]  . Colonic polyp   . Diverticulosis of colon   . ESRD on hemodialysis ([facility])    Dr. Befekadu [facility]  . Essential hypertension,  benign   . Gout   . Mixed hyperlipidemia   . OSA (obstructive sleep apnea)    CPAP  . Renal cell cancer (HCC)   . Type 2 diabetes mellitus (HCC)    Type II    Past Surgical History:  Procedure Laterality Date  . BIOPSY  01/05/2017   Procedure: BIOPSY;  Surgeon: Rourk, Robert M, MD;  [facility];  Service: Endoscopy;;  gastric  . COLONOSCOPY N/A 01/07/2016   Dr. Rourk: Grade 1 internal hemorrhoids, otherwise normal  . COLONOSCOPY W/ POLYPECTOMY  2009, 2012   2009: Dr. Rourk: 1.25 cm adenomatous polyp without high grade dysplasia, distal to IC valve. 2012: normal rectum, few pancolonic diverticula, single diminutive hyperplastic polyp  . ESOPHAGOGASTRODUODENOSCOPY  2009   Dr. Rourk: accentuated undulating Z-lin with couple of islands of salmon-colored epithelium suspicious for Barrett's, s/p biopsy with path revealing mild inflammation, no metaplasia, dysplasia, or malignancy  . ESOPHAGOGASTRODUODENOSCOPY N/A 01/05/2017   Procedure: ESOPHAGOGASTRODUODENOSCOPY (EGD);  Surgeon: Rourk, Robert M, MD;  [facility];  Service: Endoscopy;  Laterality: N/A;  . FISTULA SUPERFICIALIZATION Left 03/31/2015   Procedure: CEPHALIC VEIN TURNDOWN; PLICATION OF BRACHIOCEPHALIC AVF;  Surgeon: Brian L Chen, MD;  Location: [facility];  Service: Vascular;  Laterality: Left;  . FISTULOGRAM N/A   10/19/2013   Procedure: FISTULOGRAM;  Surgeon: Rosetta Posner, MD;  Location[facility] CATH LAB;  Service: Cardiovascular;  Laterality: N/A;  . Left arm AV fistula  2009   Dr. [name]  . PERIPHERAL VASCULAR CATHETERIZATION N/A 03/10/2015   Procedure: Fistulagram;  Surgeon: Conrad Upper Nyack, MD;  Location: [facility] CV LAB;  Service: Cardiovascular;  Laterality: N/A;  . Right nephrectomy      Family History  Problem Relation Age of Onset  . Hypertension Mother   . Hypertension Unknown        Siblings  . Diabetes type II Unknown        Siblings  . Colon cancer Neg Hx     Social History   Tobacco Use  .  Smoking status: Former Smoker    Types: Cigarettes    Last attempt to quit: 07/04/1998    Years since quitting: 18.7  . Smokeless tobacco: Never Used  . Tobacco comment: Quit May 2000  Substance Use Topics  . Alcohol use: No    Alcohol/week: 0.0 oz    Comment: Quit 2014; Previously drank about 3 drinks a day  . Drug use: No    Medications: I have reviewed the patient's current medications. Allergies as of 04/05/2017      Reactions   Metformin And Related Other (See Comments)   Diarrhea, bloody stool, upset stomach.      Medication List        Accurate as of 04/05/17 12:09 PM. Always use your most recent med list.          allopurinol 100 MG tablet Commonly known as:  ZYLOPRIM Take 100 mg by mouth daily.   BENEFIBER DRINK MIX PO Take by mouth daily. 1 tbsp   blood glucose meter kit and supplies Dispense based on patient and insurance preference. Use up to four times daily as directed. (FOR ICD-9 250.00, 250.01).   CARTIA XT 120 MG 24 hr capsule Generic drug:  diltiazem Take 1 capsule by mouth daily.   carvedilol 25 MG tablet Commonly known as:  COREG take 1/2 tablet two times daily   cinacalcet 30 MG tablet Commonly known as:  SENSIPAR Take 30 mg by mouth at bedtime.   docusate sodium 100 MG capsule Commonly known as:  COLACE Take 100 mg by mouth daily.   doxazosin 2 MG tablet Commonly known as:  CARDURA Take by mouth.   fenofibrate 160 MG tablet Take 160 mg every evening by mouth.   fish oil-omega-3 fatty acids 1000 MG capsule Take 1 g by mouth 3 (three) times daily.   glipiZIDE 5 MG 24 hr tablet Commonly known as:  GLUCOTROL XL Take 1 tablet by mouth daily.   Insulin Glargine 100 UNIT/ML Solostar Pen Commonly known as:  LANTUS SOLOSTAR Inject 10 Units into the skin daily at 10 pm.   Insulin Pen Needle 31G X 5 MM Misc Use as directed   losartan 100 MG tablet Commonly known as:  COZAAR Take by mouth.   pravastatin 10 MG tablet Commonly known  as:  PRAVACHOL Take 5 mg by mouth every evening.   sevelamer carbonate 800 MG tablet Commonly known as:  RENVELA Take 1,600-3,200 mg See admin instructions by mouth. Take 4 tablets after each meal and 2 after each snack   simvastatin 10 MG tablet Commonly known as:  ZOCOR Take by mouth.   sodium bicarbonate 650 MG tablet Take by mouth.   VISINE OP Place 2 drops into both eyes daily as  needed (dry eyes).   warfarin 2.5 MG tablet Commonly known as:  COUMADIN Take as directed by the anticoagulation clinic. If you are unsure how to take this medication, talk to your nurse or doctor. Original instructions:  TAKE 1 TABLET EVERY DAY EXCEPT TAKE  1/2 TABLET ON SUNDAYS, TUESDAYS AND THURSDAYS OR AS DIRECTED       ROS:  A comprehensive review of systems was negative except for: Eyes: positive for blurry vision Cardiovascular: positive for A fib Gastrointestinal: positive for abdominal pain, nausea and vomiting Genitourinary: positive for does not urinate, ESRD on dialysis Neurological: positive for numbness/ neuropathy  Blood pressure 124/82, pulse 92, temperature 97.8 F (36.6 C), resp. rate 18, height 6' 2" (1.88 m), weight 221 lb (100.2 kg). Physical Exam  Constitutional: He is oriented to person, place, and time and well-developed, well-nourished, and in no distress.  HENT:  Head: Normocephalic.  Eyes: Pupils are equal, round, and reactive to light.  Neck: Normal range of motion.  Cardiovascular: Normal rate.  Pulmonary/Chest: Effort normal.  Abdominal: Soft. He exhibits no distension. There is no tenderness.  RUQ scar, well healed   Musculoskeletal: Normal range of motion.  Neurological: He is alert and oriented to person, place, and time.  Skin: Skin is warm and dry.  Psychiatric: Mood, memory, affect and judgment normal.  Vitals reviewed.   Results:  EGD 12/2016 Dr. [name]  Diffuse submucosal gastric petechiae without ulcer or infiltrating process seen. Pylorus  patent. Multiple small erosions and ulcerations within the bulb extending well into the second portion of the duodenum. Bulging ampulla of Vater with fresh blood at the orifice. Prominent extrinsic compression, at least 3-4 cm, on duodenal wall just distal to ampulla.  MRI/MRCP- 12/2016  Poor quality, no gross ascites  IMPRESSION: 1. Moderate to markedly motion degraded exam. 2. Given this limitation, no dominant pancreatic mass is see n. Findings suspicious for non complicated pancreatitis.  [place] - 02/2017 Personally reviewed CBD 58m  IMPRESSION: 1. Positive for cholelithiasis with mild gallbladder wall thickening, but no Sonographic MPercell MillerSign was elicited to strongly indicate Acute Cholecystitis. If there is clinical suspicion of Acute Cholecystitis, nuclear medicine hepatobiliary scan to evaluate cystic duct patency may be valuable. There is no evidence of common bile duct obstruction. 2. Left renal cystic disease of hemodialysis. Surgically absent right kidney. 3. Benign hepatic cysts.  HIDA 03/2017 gallbladder fills, and no ejection with CCK- dyskinesia IMPRESSION: Patent biliary tree.  No gallbladder response to CCK stimulation; calculated gallbladder ejection fraction is 0%.  Assessment & Plan:  Albert CHAPUTis a 62y.o. male with some vague complaints of nausea/ vomiting in the AM with a bitter clear fluid consistent more with some reflux, no RUQ pain but some left sided pain and a history of a renal cell carcinoma in 2000 with a huge RUQ scar, and a concern on EGD for a possible extrinsic compression of his duodenum with MRI that was poor quality but saw no lesions. He is scheduled for EUS with Dr. JArdis Hughslater this month.  I have a low suspicion that his cholelithiasis / biliary dyskinesia are causing these symptoms at this time.   - Get EUS to rule out cancer / other pathology more concerning  - Will need to get a CT a/p following the EUS if nothing concerning on it  to assess the gallbladder fossa and to also look at the kidney given his history of renal cell prior to any surgical intervention  - I have  discussed with him that his symptoms are not typical for cholelithiasis or biliary dyskinesia, and that removing his gallbladder would likely require an open cholecystectomy which would increase the risk of bleeding, infection, and wound breakdown in a ESRD patient with albumin in the last of 2.5-2.7   - Protonix Rx given to see if this helps his AM nausea/spit up   - Follow up 1 month to discuss and determine if the risk of surgery outweigh the benefits and to make further plans  - I will get in touch with Walden Field regarding the plan and get him or PCP to also order the CT a/p given other complaints/ and concerns   All questions were answered to the satisfaction of the patient.  Virl Cagey 04/05/2017, 12:09 PM

## 2017-04-05 NOTE — Patient Instructions (Addendum)
Get your EUS - endoscopic ultrasound with Dr. Ardis Hughs to see if there is any mass or lesion concerning for cancer. Will likely need to get a CT scan, I will discuss with Walden Field, NP. Protonix prescription ordered. See if this helps with reflux/ bitter taste/ nausea/vomiting.   Cholelithiasis Cholelithiasis is also called "gallstones." It is a kind of gallbladder disease. The gallbladder is an organ that stores a liquid (bile) that helps you digest fat. Gallstones may not cause symptoms (may be silent gallstones) until they cause a blockage, and then they can cause pain (gallbladder attack). Follow these instructions at home:  Take over-the-counter and prescription medicines only as told by your doctor.  Stay at a healthy weight.  Eat healthy foods. This includes: ? Eating fewer fatty foods, like fried foods. ? Eating fewer refined carbs (refined carbohydrates). Refined carbs are breads and grains that are highly processed, like white bread and white rice. Instead, choose whole grains like whole-wheat bread and brown rice. ? Eating more fiber. Almonds, fresh fruit, and beans are healthy sources of fiber.  Keep all follow-up visits as told by your doctor. This is important. Contact a doctor if:  You have sudden pain in the upper right side of your belly (abdomen). Pain might spread to your right shoulder or your chest. This may be a sign of a gallbladder attack.  You feel sick to your stomach (are nauseous).  You throw up (vomit).  You have been diagnosed with gallstones that have no symptoms and you get: ? Belly pain. ? Discomfort, burning, or fullness in the upper part of your belly (indigestion). Get help right away if:  You have sudden pain in the upper right side of your belly, and it lasts for more than 2 hours.  You have belly pain that lasts for more than 5 hours.  You have a fever or chills.  You keep feeling sick to your stomach or you keep throwing up.  Your skin or  the whites of your eyes turn yellow (jaundice).  You have dark-colored pee (urine).  You have light-colored poop (stool). Summary  Cholelithiasis is also called "gallstones."  The gallbladder is an organ that stores a liquid (bile) that helps you digest fat.  Silent gallstones are gallstones that do not cause symptoms.  A gallbladder attack may cause sudden pain in the upper right side of your belly. Pain might spread to your right shoulder or your chest. If this happens, contact your doctor.  If you have sudden pain in the upper right side of your belly that lasts for more than 2 hours, get help right away. This information is not intended to replace advice given to you by your health care provider. Make sure you discuss any questions you have with your health care provider. Document Released: 07/28/2007 Document Revised: 10/26/2015 Document Reviewed: 10/26/2015 Elsevier Interactive Patient Education  2017 [employer]  Open Cholecystectomy Open cholecystectomy is surgery to remove the gallbladder. The gallbladder is a pear-shaped organ that lies beneath the liver on the right side of the body. The gallbladder stores bile, which is a fluid that helps the body to digest fats. Cholecystectomy is often done for inflammation of the gallbladder (cholecystitis). This condition is usually caused by a buildup of gallstones (cholelithiasis) in the gallbladder. Gallstones can block the flow of bile, which can result in inflammation and pain. In severe cases, emergency surgery may be required. Tell a health care provider about:  Any allergies you have.  All  medicines you are taking, including vitamins, herbs, eye drops, creams, and over-the-counter medicines.  Any problems you or family members have had with anesthetic medicines.  Any blood disorders you have.  Any surgeries you have had.  Any medical conditions you have.  Whether you are pregnant or may be pregnant. What are the  risks? Generally, this is a safe procedure. However, problems may occur, including:  Infection.  Bleeding.  Allergic reactions to medicines.  Damage to other structures or organs.  A stone remaining in the common bile duct. The common bile duct carries bile from the gallbladder into the small intestine.  A bile leak from the cyst duct that is clipped when your gallbladder is removed.  What happens before the procedure? Staying hydrated Follow instructions from your health care provider about hydration, which may include:  Up to 2 hours before the procedure - you may continue to drink clear liquids, such as water, clear fruit juice, black coffee, and plain tea.  Eating and drinking restrictions Follow instructions from your health care provider about eating and drinking, which may include:  8 hours before the procedure - stop eating heavy meals or foods such as meat, fried foods, or fatty foods.  6 hours before the procedure - stop eating light meals or foods, such as toast or cereal.  6 hours before the procedure - stop drinking milk or drinks that contain milk.  2 hours before the procedure - stop drinking clear liquids.  Medicines  Ask your health care provider about: ? Changing or stopping your regular medicines. This is especially important if you are taking diabetes medicines or blood thinners. ? Taking medicines such as aspirin and ibuprofen. These medicines can thin your blood. Do not take these medicines before your procedure if your health care provider instructs you not to.  You may be given antibiotic medicine to help prevent infection. General instructions  Let your health care provider know if you develop a cold or an infection before surgery.  Plan to have someone take you home from the hospital or clinic.  Ask your health care provider how your surgical site will be marked or identified. What happens during the procedure?  To reduce your risk of  infection: ? Your health care team will wash or sanitize their hands. ? Your skin will be washed with soap. ? Hair may be removed from the surgical area.  An IV tube may be inserted into one of your veins.  You will be given one or more of the following: ? A medicine to help you relax (sedative). ? A medicine to make you fall asleep (general anesthetic).  A breathing tube will be placed in your mouth.  Your surgeon will make a cut (incision) in the upper abdomen to access your gallbladder.  Your gallbladder will be removed.  Your common bile duct may be examined. If stones are found in the common bile duct, they may be removed.  After your gallbladder has been removed, the incisions will be closed with stitches (sutures), skin glue, or staples.  Your incision will be covered with a bandage (dressing). The procedure may vary among health care providers and hospitals. What happens after the procedure?  Your blood pressure, heart rate, breathing rate, and blood oxygen level will be monitored until the medicines you were given have worn off.  You will be given medicines as needed to control your pain.  Do not drive for 24 hours if you were given a sedative. This  information is not intended to replace advice given to you by your health care provider. Make sure you discuss any questions you have with your health care provider. Document Released: 10/31/2001 Document Revised: 08/30/2015 Document Reviewed: 07/28/2015 Elsevier Interactive Patient Education  2018 [employer].   Laparoscopic Cholecystectomy Laparoscopic cholecystectomy is surgery to remove the gallbladder. The gallbladder is a pear-shaped organ that lies beneath the liver on the right side of the body. The gallbladder stores bile, which is a fluid that helps the body to digest fats. Cholecystectomy is often done for inflammation of the gallbladder (cholecystitis). This condition is usually caused by a buildup of gallstones  (cholelithiasis) in the gallbladder. Gallstones can block the flow of bile, which can result in inflammation and pain. In severe cases, emergency surgery may be required. This procedure is done though small incisions in your abdomen (laparoscopic surgery). A thin scope with a camera (laparoscope) is inserted through one incision. Thin surgical instruments are inserted through the other incisions. In some cases, a laparoscopic procedure may be turned into a type of surgery that is done through a larger incision (open surgery). Tell a health care provider about:  Any allergies you have.  All medicines you are taking, including vitamins, herbs, eye drops, creams, and over-the-counter medicines.  Any problems you or family members have had with anesthetic medicines.  Any blood disorders you have.  Any surgeries you have had.  Any medical conditions you have.  Whether you are pregnant or may be pregnant. What are the risks? Generally, this is a safe procedure. However, problems may occur, including:  Infection.  Bleeding.  Allergic reactions to medicines.  Damage to other structures or organs.  A stone remaining in the common bile duct. The common bile duct carries bile from the gallbladder into the small intestine.  A bile leak from the cyst duct that is clipped when your gallbladder is removed.  What happens before the procedure? Staying hydrated Follow instructions from your health care provider about hydration, which may include:  Up to 2 hours before the procedure - you may continue to drink clear liquids, such as water, clear fruit juice, black coffee, and plain tea.  Eating and drinking restrictions Follow instructions from your health care provider about eating and drinking, which may include:  8 hours before the procedure - stop eating heavy meals or foods such as meat, fried foods, or fatty foods.  6 hours before the procedure - stop eating light meals or foods, such  as toast or cereal.  6 hours before the procedure - stop drinking milk or drinks that contain milk.  2 hours before the procedure - stop drinking clear liquids.  Medicines  Ask your health care provider about: ? Changing or stopping your regular medicines. This is especially important if you are taking diabetes medicines or blood thinners. ? Taking medicines such as aspirin and ibuprofen. These medicines can thin your blood. Do not take these medicines before your procedure if your health care provider instructs you not to.  You may be given antibiotic medicine to help prevent infection. General instructions  Let your health care provider know if you develop a cold or an infection before surgery.  Plan to have someone take you home from the hospital or clinic.  Ask your health care provider how your surgical site will be marked or identified. What happens during the procedure?  To reduce your risk of infection: ? Your health care team will wash or sanitize their hands. ?  Your skin will be washed with soap. ? Hair may be removed from the surgical area.  An IV tube may be inserted into one of your veins.  You will be given one or more of the following: ? A medicine to help you relax (sedative). ? A medicine to make you fall asleep (general anesthetic).  A breathing tube will be placed in your mouth.  Your surgeon will make several small cuts (incisions) in your abdomen.  The laparoscope will be inserted through one of the small incisions. The camera on the laparoscope will send images to a TV screen (monitor) in the operating room. This lets your surgeon see inside your abdomen.  Air-like gas will be pumped into your abdomen. This will expand your abdomen to give the surgeon more room to perform the surgery.  Other tools that are needed for the procedure will be inserted through the other incisions. The gallbladder will be removed through one of the incisions.  Your common  bile duct may be examined. If stones are found in the common bile duct, they may be removed.  After your gallbladder has been removed, the incisions will be closed with stitches (sutures), staples, or skin glue.  Your incisions may be covered with a bandage (dressing). The procedure may vary among health care providers and hospitals. What happens after the procedure?  Your blood pressure, heart rate, breathing rate, and blood oxygen level will be monitored until the medicines you were given have worn off.  You will be given medicines as needed to control your pain.  Do not drive for 24 hours if you were given a sedative. This information is not intended to replace advice given to you by your health care provider. Make sure you discuss any questions you have with your health care provider. Document Released: 02/08/2005 Document Revised: 08/31/2015 Document Reviewed: 07/28/2015 Elsevier Interactive Patient Education  2018 [employer].

## 2017-04-05 NOTE — Progress Notes (Signed)
[facility] History and Physical  Reason for Referral: Gallstones, dyskinesia  Referring Physician:  Walden Field NP  Chief Complaint    Cholelithiasis      Albert [name] is a 62 y.o. male.  HPI: Albert [name] is a 62 yo with multiple medical issues including A fib on coumadin, ESRD on dialysis, T/Th/Sat, prior renal cell cancer, who was referred to GI for abdominal swelling by his dialysis center due to concerns that he had fluid retention.  He reports that he feels like his abdomen is about the same as normal, and he does not feel swollen. He does have some complaints of some nausea/ and spitting up/ vomiting bitter clear liquid in the morning. He says that when he was on protonix this was better, and that he has run out of the prescription since his discharge in 12/2016.   He was admitted 12/2016 with an UGI and had an EGD that showed concern for multiple erosive areas in the stomach and duodenum and concern for extrinsic compression in the duodenum. He underwent an MRI that was of poor quality but did not see any mass, but suspected pancreatitis.  He has been seen by GI since that time and worked up for the bloating and abdominal swelling and they were concerned for ascites but US revealed stones and a HIDA was also done that showed filling of the gallbladder but a 0 EF after CCK.  He has no RUQ pain and maybe some vague left sided pain.   GI wants him to get an EUS and this has yet to be done. He also has not had any CT scan of the abdomen.   He has a history of right renal cell carcinoma, and reports that he follows with his urologist annually. He says he has had CT scans? But these are not in our system.    Past Medical History:  Diagnosis Date  . Anemia   . Atrial fibrillation ([place])   . Cardiomyopathy    LVEF 50-55% 8/11 - [facility]  . Colonic polyp   . Diverticulosis of colon   . ESRD on hemodialysis ([place])    Dr. Lowanda Foster [facility]  . Essential hypertension,  benign   . Gout   . Mixed hyperlipidemia   . OSA (obstructive sleep apnea)    CPAP  . Renal cell cancer ([place])   . Type 2 diabetes mellitus ([place])    Type II    Past Surgical History:  Procedure Laterality Date  . BIOPSY  01/05/2017   Procedure: BIOPSY;  Surgeon: Daneil Dolin, MD;  [facility];  Service: Endoscopy;;  gastric  . COLONOSCOPY N/A 01/07/2016   Dr. [name]: Grade 1 internal hemorrhoids, otherwise normal  . COLONOSCOPY W/ POLYPECTOMY  2009, 2012   2009: Dr. [name]: 1.25 cm adenomatous polyp without high grade dysplasia, distal to IC valve. 2012: normal rectum, few pancolonic diverticula, single diminutive hyperplastic polyp  . ESOPHAGOGASTRODUODENOSCOPY  2009   Dr. [name]: accentuated undulating Z-lin with couple of islands of salmon-colored epithelium suspicious for Barrett's, s/p biopsy with path revealing mild inflammation, no metaplasia, dysplasia, or malignancy  . ESOPHAGOGASTRODUODENOSCOPY N/A 01/05/2017   Procedure: ESOPHAGOGASTRODUODENOSCOPY (EGD);  Surgeon: Daneil Dolin, MD;  [facility];  Service: Endoscopy;  Laterality: N/A;  . FISTULA SUPERFICIALIZATION Left 03/31/2015   Procedure: CEPHALIC VEIN TURNDOWN; PLICATION OF BRACHIOCEPHALIC AVF;  Surgeon: Conrad [place], MD;  Location: [place];  Service: Vascular;  Laterality: Left;  . FISTULOGRAM N/A  10/19/2013   Procedure: FISTULOGRAM;  Surgeon: Rosetta Posner, MD;  Location[facility] CATH LAB;  Service: Cardiovascular;  Laterality: N/A;  . Left arm AV fistula  2009   Dr. [name]  . PERIPHERAL VASCULAR CATHETERIZATION N/A 03/10/2015   Procedure: Fistulagram;  Surgeon: Conrad [place], MD;  Location: [facility] CV LAB;  Service: Cardiovascular;  Laterality: N/A;  . Right nephrectomy      Family History  Problem Relation Age of Onset  . Hypertension Mother   . Hypertension Unknown        Siblings  . Diabetes type II Unknown        Siblings  . Colon cancer Neg Hx     Social History   Tobacco Use  .  Smoking status: Former Smoker    Types: Cigarettes    Last attempt to quit: 07/04/1998    Years since quitting: 18.7  . Smokeless tobacco: Never Used  . Tobacco comment: Quit May 2000  Substance Use Topics  . Alcohol use: No    Alcohol/week: 0.0 oz    Comment: Quit 2014; Previously drank about 3 drinks a day  . Drug use: No    Medications: I have reviewed the patient's current medications. Allergies as of 04/05/2017      Reactions   Metformin And Related Other (See Comments)   Diarrhea, bloody stool, upset stomach.      Medication List        Accurate as of 04/05/17 12:09 PM. Always use your most recent med list.          allopurinol 100 MG tablet Commonly known as:  ZYLOPRIM Take 100 mg by mouth daily.   BENEFIBER DRINK MIX PO Take by mouth daily. 1 tbsp   blood glucose meter kit and supplies Dispense based on patient and insurance preference. Use up to four times daily as directed. (FOR ICD-9 250.00, 250.01).   CARTIA XT 120 MG 24 hr capsule Generic drug:  diltiazem Take 1 capsule by mouth daily.   carvedilol 25 MG tablet Commonly known as:  COREG take 1/2 tablet two times daily   cinacalcet 30 MG tablet Commonly known as:  SENSIPAR Take 30 mg by mouth at bedtime.   docusate sodium 100 MG capsule Commonly known as:  COLACE Take 100 mg by mouth daily.   doxazosin 2 MG tablet Commonly known as:  CARDURA Take by mouth.   fenofibrate 160 MG tablet Take 160 mg every evening by mouth.   fish oil-omega-3 fatty acids 1000 MG capsule Take 1 g by mouth 3 (three) times daily.   glipiZIDE 5 MG 24 hr tablet Commonly known as:  GLUCOTROL XL Take 1 tablet by mouth daily.   Insulin Glargine 100 UNIT/ML Solostar Pen Commonly known as:  LANTUS SOLOSTAR Inject 10 Units into the skin daily at 10 pm.   Insulin Pen Needle 31G X 5 MM Misc Use as directed   losartan 100 MG tablet Commonly known as:  COZAAR Take by mouth.   pravastatin 10 MG tablet Commonly known  as:  PRAVACHOL Take 5 mg by mouth every evening.   sevelamer carbonate 800 MG tablet Commonly known as:  RENVELA Take 1,600-3,200 mg See admin instructions by mouth. Take 4 tablets after each meal and 2 after each snack   simvastatin 10 MG tablet Commonly known as:  ZOCOR Take by mouth.   sodium bicarbonate 650 MG tablet Take by mouth.   VISINE OP Place 2 drops into both eyes daily as  needed (dry eyes).   warfarin 2.5 MG tablet Commonly known as:  COUMADIN Take as directed by the anticoagulation clinic. If you are unsure how to take this medication, talk to your nurse or doctor. Original instructions:  TAKE 1 TABLET EVERY DAY EXCEPT TAKE  1/2 TABLET ON SUNDAYS, TUESDAYS AND THURSDAYS OR AS DIRECTED       ROS:  A comprehensive review of systems was negative except for: Eyes: positive for blurry vision Cardiovascular: positive for A fib Gastrointestinal: positive for abdominal pain, nausea and vomiting Genitourinary: positive for does not urinate, ESRD on dialysis Neurological: positive for numbness/ neuropathy  Blood pressure 124/82, pulse 92, temperature 97.8 F (36.6 C), resp. rate 18, height 6' 2" (1.88 m), weight 221 lb (100.2 kg). Physical Exam  Constitutional: He is oriented to person, place, and time and well-developed, well-nourished, and in no distress.  HENT:  Head: Normocephalic.  Eyes: Pupils are equal, round, and reactive to light.  Neck: Normal range of motion.  Cardiovascular: Normal rate.  Pulmonary/Chest: Effort normal.  Abdominal: Soft. He exhibits no distension. There is no tenderness.  RUQ scar, well healed   Musculoskeletal: Normal range of motion.  Neurological: He is alert and oriented to person, place, and time.  Skin: Skin is warm and dry.  Psychiatric: Mood, memory, affect and judgment normal.  Vitals reviewed.   Results:  EGD 12/2016 Dr. [name]  Diffuse submucosal gastric petechiae without ulcer or infiltrating process seen. Pylorus  patent. Multiple small erosions and ulcerations within the bulb extending well into the second portion of the duodenum. Bulging ampulla of Vater with fresh blood at the orifice. Prominent extrinsic compression, at least 3-4 cm, on duodenal wall just distal to ampulla.  MRI/MRCP- 12/2016  Poor quality, no gross ascites  IMPRESSION: 1. Moderate to markedly motion degraded exam. 2. Given this limitation, no dominant pancreatic mass is see n. Findings suspicious for non complicated pancreatitis.  [place] - 02/2017 Personally reviewed CBD 58m  IMPRESSION: 1. Positive for cholelithiasis with mild gallbladder wall thickening, but no Sonographic MPercell MillerSign was elicited to strongly indicate Acute Cholecystitis. If there is clinical suspicion of Acute Cholecystitis, nuclear medicine hepatobiliary scan to evaluate cystic duct patency may be valuable. There is no evidence of common bile duct obstruction. 2. Left renal cystic disease of hemodialysis. Surgically absent right kidney. 3. Benign hepatic cysts.  HIDA 03/2017 gallbladder fills, and no ejection with CCK- dyskinesia IMPRESSION: Patent biliary tree.  No gallbladder response to CCK stimulation; calculated gallbladder ejection fraction is 0%.  Assessment & Plan:  ALONNELL CHAPUTis a 62y.o. male with some vague complaints of nausea/ vomiting in the AM with a bitter clear fluid consistent more with some reflux, no RUQ pain but some left sided pain and a history of a renal cell carcinoma in 2000 with a huge RUQ scar, and a concern on EGD for a possible extrinsic compression of his duodenum with MRI that was poor quality but saw no lesions. He is scheduled for EUS with Dr. JArdis Hughslater this month.  I have a low suspicion that his cholelithiasis / biliary dyskinesia are causing these symptoms at this time.   - Get EUS to rule out cancer / other pathology more concerning  - Will need to get a CT a/p following the EUS if nothing concerning on it  to assess the gallbladder fossa and to also look at the kidney given his history of renal cell prior to any surgical intervention  - I have  discussed with him that his symptoms are not typical for cholelithiasis or biliary dyskinesia, and that removing his gallbladder would likely require an open cholecystectomy which would increase the risk of bleeding, infection, and wound breakdown in a ESRD patient with albumin in the last of 2.5-2.7   - Protonix Rx given to see if this helps his AM nausea/spit up   - Follow up 1 month to discuss and determine if the risk of surgery outweigh the benefits and to make further plans  - I will get in touch with Walden Field regarding the plan and get him or PCP to also order the CT a/p given other complaints/ and concerns   All questions were answered to the satisfaction of the patient.  Virl Cagey 04/05/2017, 12:09 PM

## 2017-04-06 ENCOUNTER — Other Ambulatory Visit: Payer: Self-pay | Admitting: Urology

## 2017-04-06 ENCOUNTER — Ambulatory Visit (INDEPENDENT_AMBULATORY_CARE_PROVIDER_SITE_OTHER): Payer: Medicare HMO | Admitting: *Deleted

## 2017-04-06 ENCOUNTER — Telehealth: Payer: Self-pay | Admitting: Nurse Practitioner

## 2017-04-06 DIAGNOSIS — I4891 Unspecified atrial fibrillation: Secondary | ICD-10-CM

## 2017-04-06 DIAGNOSIS — D3002 Benign neoplasm of left kidney: Secondary | ICD-10-CM

## 2017-04-06 DIAGNOSIS — Z5181 Encounter for therapeutic drug level monitoring: Secondary | ICD-10-CM | POA: Diagnosis not present

## 2017-04-06 DIAGNOSIS — R1084 Generalized abdominal pain: Secondary | ICD-10-CM

## 2017-04-06 LAB — POCT INR: INR: 2.1

## 2017-04-06 NOTE — Telephone Encounter (Signed)
Please schedule the patient for CT of the abdomen and pelvis as recommended by surgery.  I will put the order in.  Please notify the patient.  Appears patient has been on tiw HD at least 6 months (likely at least since 2015 per record review).   Per CT tech, ok for CT A/P with IV and oral contrast if on HD >6 months. I will put in the order.

## 2017-04-06 NOTE — Patient Instructions (Signed)
Continue coumadin 1 tablet daily except 1/2 tablet on Sunday and Thursdays Recheck in 4 weeks

## 2017-04-07 DIAGNOSIS — Z992 Dependence on renal dialysis: Secondary | ICD-10-CM | POA: Diagnosis not present

## 2017-04-07 DIAGNOSIS — N186 End stage renal disease: Secondary | ICD-10-CM | POA: Diagnosis not present

## 2017-04-07 NOTE — Telephone Encounter (Signed)
CT abd/pelvis scheduled for 04/11/17 at 4:00pm, arrival 3:45pm, NPO 4 hrs prior, needs to pick up oral contrast at least 1 day prior to test.

## 2017-04-07 NOTE — Telephone Encounter (Signed)
LMOVM for pt 

## 2017-04-07 NOTE — Telephone Encounter (Signed)
PA for CT ABD/PELVIS was approved. [id number] dates:04/11/17-05/11/17.

## 2017-04-07 NOTE — Telephone Encounter (Signed)
Patient called back and is aware of appt details. Nothing further needed

## 2017-04-09 DIAGNOSIS — Z992 Dependence on renal dialysis: Secondary | ICD-10-CM | POA: Diagnosis not present

## 2017-04-09 DIAGNOSIS — N186 End stage renal disease: Secondary | ICD-10-CM | POA: Diagnosis not present

## 2017-04-11 ENCOUNTER — Encounter (HOSPITAL_COMMUNITY): Payer: Self-pay

## 2017-04-11 ENCOUNTER — Ambulatory Visit (HOSPITAL_COMMUNITY)
Admission: RE | Admit: 2017-04-11 | Discharge: 2017-04-11 | Disposition: A | Discharge status: Home / Self Care | Payer: Medicare HMO | Source: Ambulatory Visit | Attending: Nurse Practitioner | Admitting: Nurse Practitioner

## 2017-04-11 DIAGNOSIS — Z905 Acquired absence of kidney: Secondary | ICD-10-CM | POA: Insufficient documentation

## 2017-04-11 DIAGNOSIS — K8020 Calculus of gallbladder without cholecystitis without obstruction: Secondary | ICD-10-CM | POA: Insufficient documentation

## 2017-04-11 DIAGNOSIS — J9811 Atelectasis: Secondary | ICD-10-CM | POA: Diagnosis not present

## 2017-04-11 DIAGNOSIS — I517 Cardiomegaly: Secondary | ICD-10-CM | POA: Insufficient documentation

## 2017-04-11 DIAGNOSIS — R1084 Generalized abdominal pain: Secondary | ICD-10-CM | POA: Insufficient documentation

## 2017-04-11 DIAGNOSIS — R9389 Abnormal findings on diagnostic imaging of other specified body structures: Secondary | ICD-10-CM | POA: Insufficient documentation

## 2017-04-11 DIAGNOSIS — I2510 Atherosclerotic heart disease of native coronary artery without angina pectoris: Secondary | ICD-10-CM | POA: Diagnosis not present

## 2017-04-11 DIAGNOSIS — N2889 Other specified disorders of kidney and ureter: Secondary | ICD-10-CM | POA: Diagnosis not present

## 2017-04-11 DIAGNOSIS — I700 Atherosclerosis of aorta: Secondary | ICD-10-CM | POA: Diagnosis not present

## 2017-04-11 DIAGNOSIS — R109 Unspecified abdominal pain: Secondary | ICD-10-CM | POA: Diagnosis not present

## 2017-04-11 LAB — POCT I-STAT CREATININE: CREATININE: 7.6 mg/dL — AB (ref 0.61–1.24)

## 2017-04-11 MED ORDER — IOPAMIDOL (ISOVUE-300) INJECTION 61%
100.0000 mL | Freq: Once | INTRAVENOUS | Status: AC | PRN
  Administered 2017-04-11: 100 mL via INTRAVENOUS

## 2017-04-12 ENCOUNTER — Telehealth: Payer: Self-pay | Admitting: Nurse Practitioner

## 2017-04-12 DIAGNOSIS — Z992 Dependence on renal dialysis: Secondary | ICD-10-CM | POA: Diagnosis not present

## 2017-04-12 DIAGNOSIS — N186 End stage renal disease: Secondary | ICD-10-CM | POA: Diagnosis not present

## 2017-04-12 NOTE — Telephone Encounter (Signed)
Error

## 2017-04-13 ENCOUNTER — Encounter (HOSPITAL_COMMUNITY): Payer: Self-pay | Admitting: Emergency Medicine

## 2017-04-13 ENCOUNTER — Other Ambulatory Visit: Payer: Self-pay

## 2017-04-14 DIAGNOSIS — Z992 Dependence on renal dialysis: Secondary | ICD-10-CM | POA: Diagnosis not present

## 2017-04-14 DIAGNOSIS — N186 End stage renal disease: Secondary | ICD-10-CM | POA: Diagnosis not present

## 2017-04-16 DIAGNOSIS — N186 End stage renal disease: Secondary | ICD-10-CM | POA: Diagnosis not present

## 2017-04-16 DIAGNOSIS — Z992 Dependence on renal dialysis: Secondary | ICD-10-CM | POA: Diagnosis not present

## 2017-04-19 DIAGNOSIS — Z992 Dependence on renal dialysis: Secondary | ICD-10-CM | POA: Diagnosis not present

## 2017-04-19 DIAGNOSIS — N186 End stage renal disease: Secondary | ICD-10-CM | POA: Diagnosis not present

## 2017-04-20 ENCOUNTER — Other Ambulatory Visit: Payer: Self-pay | Admitting: Cardiology

## 2017-04-20 DIAGNOSIS — N186 End stage renal disease: Secondary | ICD-10-CM | POA: Diagnosis not present

## 2017-04-20 DIAGNOSIS — Z992 Dependence on renal dialysis: Secondary | ICD-10-CM | POA: Diagnosis not present

## 2017-04-21 ENCOUNTER — Ambulatory Visit (HOSPITAL_COMMUNITY): Payer: Medicare HMO | Admitting: Anesthesiology

## 2017-04-21 ENCOUNTER — Encounter (HOSPITAL_COMMUNITY): Payer: Self-pay | Admitting: Emergency Medicine

## 2017-04-21 ENCOUNTER — Ambulatory Visit (HOSPITAL_COMMUNITY)
Admission: RE | Admit: 2017-04-21 | Discharge: 2017-04-21 | Disposition: A | Discharge status: Home / Self Care | Payer: Medicare HMO | Source: Ambulatory Visit | Attending: Gastroenterology | Admitting: Gastroenterology

## 2017-04-21 ENCOUNTER — Encounter (HOSPITAL_COMMUNITY)
Admission: RE | Disposition: A | Discharge status: Home / Self Care | Payer: Self-pay | Source: Ambulatory Visit | Attending: Gastroenterology

## 2017-04-21 ENCOUNTER — Other Ambulatory Visit: Payer: Self-pay

## 2017-04-21 DIAGNOSIS — K8020 Calculus of gallbladder without cholecystitis without obstruction: Secondary | ICD-10-CM | POA: Diagnosis not present

## 2017-04-21 DIAGNOSIS — Z992 Dependence on renal dialysis: Secondary | ICD-10-CM | POA: Insufficient documentation

## 2017-04-21 DIAGNOSIS — E1122 Type 2 diabetes mellitus with diabetic chronic kidney disease: Secondary | ICD-10-CM | POA: Insufficient documentation

## 2017-04-21 DIAGNOSIS — R195 Other fecal abnormalities: Secondary | ICD-10-CM | POA: Diagnosis not present

## 2017-04-21 DIAGNOSIS — I429 Cardiomyopathy, unspecified: Secondary | ICD-10-CM | POA: Diagnosis not present

## 2017-04-21 DIAGNOSIS — N186 End stage renal disease: Secondary | ICD-10-CM | POA: Insufficient documentation

## 2017-04-21 DIAGNOSIS — Z79899 Other long term (current) drug therapy: Secondary | ICD-10-CM | POA: Diagnosis not present

## 2017-04-21 DIAGNOSIS — E782 Mixed hyperlipidemia: Secondary | ICD-10-CM | POA: Insufficient documentation

## 2017-04-21 DIAGNOSIS — Z86010 Personal history of colonic polyps: Secondary | ICD-10-CM | POA: Insufficient documentation

## 2017-04-21 DIAGNOSIS — Z87891 Personal history of nicotine dependence: Secondary | ICD-10-CM | POA: Insufficient documentation

## 2017-04-21 DIAGNOSIS — Z7901 Long term (current) use of anticoagulants: Secondary | ICD-10-CM | POA: Insufficient documentation

## 2017-04-21 DIAGNOSIS — Z794 Long term (current) use of insulin: Secondary | ICD-10-CM | POA: Insufficient documentation

## 2017-04-21 DIAGNOSIS — K862 Cyst of pancreas: Secondary | ICD-10-CM | POA: Diagnosis not present

## 2017-04-21 DIAGNOSIS — R933 Abnormal findings on diagnostic imaging of other parts of digestive tract: Secondary | ICD-10-CM

## 2017-04-21 DIAGNOSIS — D649 Anemia, unspecified: Secondary | ICD-10-CM | POA: Diagnosis not present

## 2017-04-21 DIAGNOSIS — M109 Gout, unspecified: Secondary | ICD-10-CM | POA: Insufficient documentation

## 2017-04-21 DIAGNOSIS — I4891 Unspecified atrial fibrillation: Secondary | ICD-10-CM | POA: Insufficient documentation

## 2017-04-21 DIAGNOSIS — I120 Hypertensive chronic kidney disease with stage 5 chronic kidney disease or end stage renal disease: Secondary | ICD-10-CM | POA: Insufficient documentation

## 2017-04-21 DIAGNOSIS — Z8719 Personal history of other diseases of the digestive system: Secondary | ICD-10-CM | POA: Diagnosis not present

## 2017-04-21 DIAGNOSIS — G4733 Obstructive sleep apnea (adult) (pediatric): Secondary | ICD-10-CM | POA: Insufficient documentation

## 2017-04-21 DIAGNOSIS — R978 Other abnormal tumor markers: Secondary | ICD-10-CM | POA: Diagnosis not present

## 2017-04-21 DIAGNOSIS — K7689 Other specified diseases of liver: Secondary | ICD-10-CM | POA: Insufficient documentation

## 2017-04-21 DIAGNOSIS — Z888 Allergy status to other drugs, medicaments and biological substances status: Secondary | ICD-10-CM | POA: Insufficient documentation

## 2017-04-21 DIAGNOSIS — Z9989 Dependence on other enabling machines and devices: Secondary | ICD-10-CM | POA: Insufficient documentation

## 2017-04-21 DIAGNOSIS — Z85528 Personal history of other malignant neoplasm of kidney: Secondary | ICD-10-CM | POA: Insufficient documentation

## 2017-04-21 DIAGNOSIS — K922 Gastrointestinal hemorrhage, unspecified: Secondary | ICD-10-CM | POA: Diagnosis not present

## 2017-04-21 HISTORY — PX: EUS: SHX5427

## 2017-04-21 LAB — PANC CYST FLD ANLYS-PATHFNDR-TG

## 2017-04-21 LAB — GLUCOSE, CAPILLARY: Glucose-Capillary: 112 mg/dL — ABNORMAL HIGH (ref 65–99)

## 2017-04-21 SURGERY — UPPER ENDOSCOPIC ULTRASOUND (EUS) LINEAR
Anesthesia: Monitor Anesthesia Care

## 2017-04-21 MED ORDER — PROPOFOL 500 MG/50ML IV EMUL
INTRAVENOUS | Status: DC | PRN
  Administered 2017-04-21: 120 ug/kg/min via INTRAVENOUS

## 2017-04-21 MED ORDER — PHENYLEPHRINE HCL 10 MG/ML IJ SOLN
INTRAMUSCULAR | Status: DC | PRN
  Administered 2017-04-21: 75 ug/min via INTRAVENOUS

## 2017-04-21 MED ORDER — CIPROFLOXACIN IN D5W 400 MG/200ML IV SOLN
INTRAVENOUS | Status: AC
  Filled 2017-04-21: qty 200

## 2017-04-21 MED ORDER — PHENYLEPHRINE HCL 10 MG/ML IJ SOLN
INTRAMUSCULAR | Status: DC | PRN
  Administered 2017-04-21: 120 ug via INTRAVENOUS
  Administered 2017-04-21: 80 ug via INTRAVENOUS
  Administered 2017-04-21: 200 ug via INTRAVENOUS
  Administered 2017-04-21: 120 ug via INTRAVENOUS

## 2017-04-21 MED ORDER — LACTATED RINGERS IV SOLN: INTRAVENOUS | Status: DC

## 2017-04-21 MED ORDER — PROPOFOL 10 MG/ML IV BOLUS
INTRAVENOUS | Status: DC | PRN
  Administered 2017-04-21: 120 mg via INTRAVENOUS
  Administered 2017-04-21: 60 mg via INTRAVENOUS
  Administered 2017-04-21 (×2): 20 mg via INTRAVENOUS

## 2017-04-21 MED ORDER — ONDANSETRON HCL 4 MG/2ML IJ SOLN
INTRAMUSCULAR | Status: DC | PRN
  Administered 2017-04-21: 4 mg via INTRAVENOUS

## 2017-04-21 MED ORDER — PROPOFOL 10 MG/ML IV BOLUS
INTRAVENOUS | Status: AC
  Filled 2017-04-21: qty 40

## 2017-04-21 MED ORDER — SODIUM CHLORIDE 0.9 % IV SOLN
INTRAVENOUS | Status: DC
  Administered 2017-04-21 (×2): via INTRAVENOUS

## 2017-04-21 MED ORDER — CIPROFLOXACIN IN D5W 400 MG/200ML IV SOLN
INTRAVENOUS | Status: DC | PRN
  Administered 2017-04-21: 400 mg via INTRAVENOUS

## 2017-04-21 MED ORDER — CIPROFLOXACIN HCL 500 MG PO TABS: 500.0000 mg | ORAL_TABLET | Freq: Every day | ORAL | 0 refills | Status: DC

## 2017-04-21 MED ORDER — SUCCINYLCHOLINE CHLORIDE 20 MG/ML IJ SOLN
INTRAMUSCULAR | Status: DC | PRN
  Administered 2017-04-21: 140 mg via INTRAVENOUS

## 2017-04-21 NOTE — Transfer of Care (Signed)
Immediate Anesthesia Transfer of Care Note  Patient: Albert [name]  Procedure(s) Performed: UPPER ENDOSCOPIC ULTRASOUND (EUS) LINEAR (N/A )  Patient Location: PACU  Anesthesia Type:General  Level of Consciousness: awake, alert  and oriented  Airway & Oxygen Therapy: Patient Spontanous Breathing and Patient connected to nasal cannula oxygen  Post-op Assessment: Report given to RN and Post -op Vital signs reviewed and stable  Post vital signs: Reviewed and stable  Last Vitals:  Vitals:   04/21/17 0940  BP: (!) 146/94  Pulse: 97  Resp: 18  Temp: (!) 36.3 C  SpO2: 99%    Last Pain:  Vitals:   04/21/17 0940  TempSrc: Oral         Complications: No apparent anesthesia complications

## 2017-04-21 NOTE — Anesthesia Procedure Notes (Signed)
Procedure Name: Intubation Date/Time: 04/21/2017 11:56 AM Performed by: Glory Buff, CRNA Pre-anesthesia Checklist: Patient identified, Emergency Drugs available, Suction available and Patient being monitored Patient Re-evaluated:Patient Re-evaluated prior to induction Oxygen Delivery Method: Circle system utilized Preoxygenation: Pre-oxygenation with 100% oxygen Induction Type: IV induction Ventilation: Mask ventilation without difficulty Laryngoscope Size: Miller and 3 Grade View: Grade I Tube type: Oral Tube size: 7.5 mm Number of attempts: 1 Airway Equipment and Method: Stylet and Oral airway Placement Confirmation: ETT inserted through vocal cords under direct vision,  positive ETCO2 and breath sounds checked- equal and bilateral Secured at: 21 cm Tube secured with: Tape Dental Injury: Teeth and Oropharynx as per pre-operative assessment

## 2017-04-21 NOTE — Anesthesia Preprocedure Evaluation (Addendum)
Anesthesia Evaluation  Patient identified by MRN, date of birth, ID band Patient awake    Reviewed: Allergy & Precautions, NPO status , Patient's Chart, lab work & pertinent test results, reviewed documented beta blocker date and time   Airway Mallampati: I  TM Distance: >3 FB     Dental  (+) Partial Upper, Missing, Dental Advisory Given, Teeth Intact,    Pulmonary sleep apnea , former smoker,     + decreased breath sounds      Cardiovascular hypertension, Pt. on home beta blockers + dysrhythmias Atrial Fibrillation  Rhythm:Irregular Rate:Abnormal     Neuro/Psych    GI/Hepatic   Endo/Other  diabetes, Type 2  Renal/GU DialysisRenal disease     Musculoskeletal   Abdominal   Peds  Hematology  (+) anemia ,   Anesthesia Other Findings   Reproductive/Obstetrics                            Anesthesia Physical  Anesthesia Plan  ASA: III  Anesthesia Plan: MAC   Post-op Pain Management:    Induction: Intravenous  PONV Risk Score and Plan: 1 and Propofol infusion and Ondansetron  Airway Management Planned: Mask  Additional Equipment:   Intra-op Plan:   Post-operative Plan:   Informed Consent: I have reviewed the patients History and Physical, chart, labs and discussed the procedure including the risks, benefits and alternatives for the proposed anesthesia with the patient or authorized representative who has indicated his/her understanding and acceptance.   Dental advisory given  Plan Discussed with: CRNA  Anesthesia Plan Comments:         Anesthesia Quick Evaluation

## 2017-04-21 NOTE — Op Note (Signed)
[facility] Patient Name: Albert [name] Procedure Date: 04/21/2017 MRN: [id number] Attending MD: Milus Banister , MD Date of Birth: [birth year] CSN: [id number] Age: 62 Admit Type: Outpatient Procedure:                Upper EUS Indications:              Abnormal EGD during hosp admission for GI bleeding                            "The examined esophagus was normal except for                            somewhat irregular Z line. diffuse submucosal                            gastric petechiae without ulcer or infiltrating                            process seen. Pylorus patent. Multiple small                            erosions and ulcerations within the bulb extending                            well into the second portion of the duodenum.                            Bulging ampulla of Vater with fresh blood at the                            orifice. Prominent extrinsic compression, at least                            3-4 cm, on duodenal wall just distal to ampulla.                            Upper GI bleeding secondary to either hemobilia or                            Hemosucus Pancreaticus. This is concerning for                            underlying malignancy." Follow up MRI suggested                            possible pancreatitis. No pancreatic enzyme data.                            Recent CT scan "Pancreas: New pancreatic tail 5.1 x                            4.1 x 3.9 cm fluid appearin collection. Adjacent  free fluid extends along the anterior                            superiormargin of Gerota's fascia. No obstructing                            mass seen as cause of thisfinding and may reflect                            pancreatitis. No thrombosis of the adjacentsplenic                            vein." Providers:                Milus Banister, MD, Cleda Daub, RN, Elspeth Cho Tech., Technician,  Charolette Child,                            Technician, Danley Danker, CRNA Referring MD:              Medicines:                General Anesthesia, Cipro 093 mg IV Complications:            No immediate complications. Estimated blood loss:                            None. Estimated Blood Loss:     Estimated blood loss: none. Procedure:                Pre-Anesthesia Assessment:                           - Prior to the procedure, a History and Physical                            was performed, and patient medications and                            allergies were reviewed. The patient's tolerance of                            previous anesthesia was also reviewed. The risks                            and benefits of the procedure and the sedation                            options and risks were discussed with the patient.                            All questions were answered, and informed consent  was obtained. Prior Anticoagulants: The patient has                            taken Coumadin (warfarin), last dose was 5 days                            prior to procedure. ASA Grade Assessment: III - A                            patient with severe systemic disease. After                            reviewing the risks and benefits, the patient was                            deemed in satisfactory condition to undergo the                            procedure.                           After obtaining informed consent, the endoscope was                            passed under direct vision. Throughout the                            procedure, the patient's blood pressure, pulse, and                            oxygen saturations were monitored continuously. The                            [id number] ([id number]) scope was introduced through                            the mouth, and advanced to the third part of                            duodenum. The upper EUS was  accomplished without                            difficulty. The patient tolerated the procedure                            well. Scope In: Scope Out: Findings:      Endoscopic Finding :      1. Normal esophagus      2. Normal stomach.      3. Slighly bulbous major papilla, not neoplastic appearing. The       extrinsic compression distal to the ampulla described on recent EGD was       not visible. (using front viewing echoendoscope and side viewing       duodenoscope)      Endosonographic Finding :  1. Normal CBD      2. Stones in the gallbladder      3. Normal echolayering of the stomach, duodenum (no submucosal lesions).      4. Pancreatic parenchyma in head, neck and body was normal.      5. Mixed solid/cystic lesion involving the tail of pancreas (described       on recent CT as well): this measured 5cm maximally. Morphologically this       appears most similar to a complex pseudocyst, perhaps walled off       necrosis? I performed transduodenal FNA with a 22 gauge needle,       completely aspirating the fluid component (serosangunious fluid, 40cc)       and passing the needle through the solid component several times during       aspiration. Impression:               - The endoscopic findings noted during EGD 12/2016                            were not present today, see above. I suspect                            whatever acute illness caused them has resolved.                           - Cholelithiasis.                           - Mixed solid/cystic fluid collection involving the                            tail of pancreas. Seems most consistent with                            complex pseudocyst. The fluid was completely                            aspirated and sent for cytology, CEA and amyase. Moderate Sedation:      N/A- Per Anesthesia Care Recommendation:           - Discharge patient to home (ambulatory).                           - Please complete three days of  twice daily cipro,                            called into your pharmacy today. Procedure Code(s):        --- Professional ---                           [id number], Esophagogastroduodenoscopy, flexible,                            transoral; with transendoscopic ultrasound-guided                            intramural or transmural fine needle  aspiration/biopsy(s), (includes endoscopic                            ultrasound examination limited to the esophagus,                            stomach or duodenum, and adjacent structures) Diagnosis Code(s):        --- Professional ---                           K86.2, Cyst of pancreas                           K92.9, Disease of digestive system, unspecified CPT copyright 2016 American Medical Association. All rights reserved. The codes documented in this report are preliminary and upon coder review may  be revised to meet current compliance requirements. Milus Banister, MD 04/21/2017 12:58:55 PM This report has been signed electronically. Number of Addenda: 0

## 2017-04-21 NOTE — Interval H&P Note (Signed)
History and Physical Interval Note:  04/21/2017 10:16 AM  Albert [name]  has presented today for surgery, with the diagnosis of abnormal stomach  The various methods of treatment have been discussed with the patient and family. After consideration of risks, benefits and other options for treatment, the patient has consented to  Procedure(s): UPPER ENDOSCOPIC ULTRASOUND (EUS) LINEAR (N/A) as a surgical intervention .  The patient's history has been reviewed, patient examined, no change in status, stable for surgery.  I have reviewed the patient's chart and labs.  Questions were answered to the patient's satisfaction.     Milus Banister

## 2017-04-21 NOTE — Discharge Instructions (Signed)
Esophagogastroduodenoscopy, Care After °Refer to this sheet in the next few weeks. These instructions provide you with information about caring for yourself after your procedure. Your health care provider may also give you more specific instructions. Your treatment has been planned according to current medical practices, but problems sometimes occur. Call your health care provider if you have any problems or questions after your procedure. °What can I expect after the procedure? °After the procedure, it is common to have: °· A sore throat. °· Nausea. °· Bloating. °· Dizziness. °· Fatigue. ° °Follow these instructions at home: °· Do not eat or drink anything until the numbing medicine (local anesthetic) has worn off and your gag reflex has returned. You will know that the local anesthetic has worn off when you can swallow comfortably. °· Do not drive for 24 hours if you received a medicine to help you relax (sedative). °· If your health care provider took a tissue sample for testing during the procedure, make sure to get your test results. This is your responsibility. Ask your health care provider or the department performing the test when your results will be ready. °· Keep all follow-up visits as told by your health care provider. This is important. °Contact a health care provider if: °· You cannot stop coughing. °· You are not urinating. °· You are urinating less than usual. °Get help right away if: °· You have trouble swallowing. °· You cannot eat or drink. °· You have throat or chest pain that gets worse. °· You are dizzy or light-headed. °· You faint. °· You have nausea or vomiting. °· You have chills. °· You have a fever. °· You have severe abdominal pain. °· You have black, tarry, or bloody stools. °This information is not intended to replace advice given to you by your health care provider. Make sure you discuss any questions you have with your health care provider. °Document Released: 01/26/2012 Document  Revised: 07/17/2015 Document Reviewed: 01/02/2015 °Elsevier Interactive Patient Education © 2018 [employer]. ° °

## 2017-04-22 DIAGNOSIS — N186 End stage renal disease: Secondary | ICD-10-CM | POA: Diagnosis not present

## 2017-04-22 DIAGNOSIS — Z992 Dependence on renal dialysis: Secondary | ICD-10-CM | POA: Diagnosis not present

## 2017-04-22 NOTE — Anesthesia Postprocedure Evaluation (Signed)
Anesthesia Post Note  Patient: Albert [name]  Procedure(s) Performed: UPPER ENDOSCOPIC ULTRASOUND (EUS) LINEAR (N/A )     Patient location during evaluation: PACU Anesthesia Type: MAC Level of consciousness: awake and alert Pain management: pain level controlled Vital Signs Assessment: post-procedure vital signs reviewed and stable Respiratory status: spontaneous breathing Cardiovascular status: stable Anesthetic complications: no    Last Vitals:  Vitals:   04/21/17 1310 04/21/17 1315  BP: 117/76 126/85  Pulse: 84 86  Resp: 17 (!) 25  Temp:    SpO2: 94% 90%    Last Pain:  Vitals:   04/21/17 1255  TempSrc: Oral   Pain Goal:                 Nolon Nations

## 2017-04-23 DIAGNOSIS — Z992 Dependence on renal dialysis: Secondary | ICD-10-CM | POA: Diagnosis not present

## 2017-04-23 DIAGNOSIS — N186 End stage renal disease: Secondary | ICD-10-CM | POA: Diagnosis not present

## 2017-04-26 DIAGNOSIS — Z992 Dependence on renal dialysis: Secondary | ICD-10-CM | POA: Diagnosis not present

## 2017-04-26 DIAGNOSIS — N186 End stage renal disease: Secondary | ICD-10-CM | POA: Diagnosis not present

## 2017-04-28 DIAGNOSIS — Z992 Dependence on renal dialysis: Secondary | ICD-10-CM | POA: Diagnosis not present

## 2017-04-28 DIAGNOSIS — N186 End stage renal disease: Secondary | ICD-10-CM | POA: Diagnosis not present

## 2017-04-30 DIAGNOSIS — Z992 Dependence on renal dialysis: Secondary | ICD-10-CM | POA: Diagnosis not present

## 2017-04-30 DIAGNOSIS — N186 End stage renal disease: Secondary | ICD-10-CM | POA: Diagnosis not present

## 2017-05-02 ENCOUNTER — Telehealth: Payer: Self-pay | Admitting: Gastroenterology

## 2017-05-02 NOTE — Telephone Encounter (Signed)
   Fluid testing results: Cytology: no malignant appearing cells. CEA 22 ng/mL (low) Amylase 18,227 U/L (high)  Patty, Please call him.  Let him know there are no signs of cancer in the fluid.  I am most suspicious that it was a pseudocyst and I suspect it will resolve with time.    Randall Hiss and Ronalee Belts, [place]

## 2017-05-02 NOTE — Telephone Encounter (Signed)
The patient has been notified of this information and all questions answered.

## 2017-05-03 DIAGNOSIS — Z992 Dependence on renal dialysis: Secondary | ICD-10-CM | POA: Diagnosis not present

## 2017-05-03 DIAGNOSIS — N186 End stage renal disease: Secondary | ICD-10-CM | POA: Diagnosis not present

## 2017-05-03 NOTE — Telephone Encounter (Signed)
Noted, thanks for your help!

## 2017-05-04 ENCOUNTER — Ambulatory Visit (INDEPENDENT_AMBULATORY_CARE_PROVIDER_SITE_OTHER): Payer: Medicare HMO | Admitting: *Deleted

## 2017-05-04 DIAGNOSIS — I4891 Unspecified atrial fibrillation: Secondary | ICD-10-CM

## 2017-05-04 DIAGNOSIS — Z5181 Encounter for therapeutic drug level monitoring: Secondary | ICD-10-CM | POA: Diagnosis not present

## 2017-05-04 LAB — POCT INR: INR: 1.7

## 2017-05-04 NOTE — Patient Instructions (Signed)
Take coumadin 1 1/2 tablets tonight, take 1 tablet tomorrow then resume 1 tablet daily except 1/2 tablet on Sunday and Thursdays Recheck in 2 weeks

## 2017-05-05 ENCOUNTER — Ambulatory Visit: Payer: Medicare HMO | Admitting: General Surgery

## 2017-05-05 ENCOUNTER — Encounter: Payer: Self-pay | Admitting: General Surgery

## 2017-05-05 VITALS — BP 137/72 | HR 106 | Temp 98.0°F | Resp 18 | Ht 74.0 in | Wt 222.0 lb

## 2017-05-05 DIAGNOSIS — N186 End stage renal disease: Secondary | ICD-10-CM | POA: Diagnosis not present

## 2017-05-05 DIAGNOSIS — K8020 Calculus of gallbladder without cholecystitis without obstruction: Secondary | ICD-10-CM

## 2017-05-05 DIAGNOSIS — Z992 Dependence on renal dialysis: Secondary | ICD-10-CM | POA: Diagnosis not present

## 2017-05-05 NOTE — Progress Notes (Signed)
[facility] Note   HPI: 62 y.o. Male presents to clinic for follow up. He has multiple medical issues including A fib on coumadin, ESRD on dialysis, T/Th/Sat, prior renal cell cancer, who was referred to me by GI for gallstones and biliary dyskinesia symptoms.  He had been referred to GI for abdominal swelling by his dialysis center due to concerns that he had fluid retention.  He also had been seen by GI in the hospital 12/2016, and Dr. [name] had completed and EGD and was concerned for a possible pancreatic mass.  An MRI was attempted but was of poor quality, and he was scheduled to undergo an EUS after our last visit.   GI also obtained an [place] that demonstrated stones and a HIDA that demonstrated dyskinesia with reported EF of 0. Yet, Mr. Suzan Garibaldi had no signs of symptoms of any gallbladder issues. He reported no abdominal pain. He reported no feelings of bloating, and he reported no nausea.  He did report some bitterness in the back of his mouth in the AM and spitting up clear fluid in the AM.  He did not feel that his abdomen was swollen. I prescribed him protonix Bid at that visit.    He has since had his EUS and cytology and fluid sampling are consistent with a pseudocyst. He also underwent a CT scan that demonstrated a pancreatic tail mass (likely the pseudocyst) and left sided renal lesions (history of right nephrectomy for renal cell carcinoma).    He returns to me today and again has no complaints. He says he has no abdominal pain, no pain with eating, no bloating or nausea. He says the bitter taste in the back of his mouth in the AM and his spitting up is much improved with the protonix.  He otherwise is feeling quite well.  He is scheduled for an MRI of the abdomen to better assess the pancreatic lesion and the left kidney lesions 3/15.   Review of Systems:  No abdominal pain No nausea/vomiting No pain with eating Improved bitter taste in his mouth in AM with  protonix  All other review of systems: otherwise negative   Vital Signs:  BP 137/72   Pulse (!) 106   Temp 98 F (36.7 C)   Resp 18   Ht 6\' 2"  (1.88 m)   Wt 222 lb (100.7 kg)   BMI 28.50 kg/m    Physical Exam:  Physical Exam  Constitutional: He is oriented to person, place, and time and well-developed, well-nourished, and in no distress.  HENT:  Head: Normocephalic.  Eyes: Pupils are equal, round, and reactive to light.  Cardiovascular: Normal rate.  Pulmonary/Chest: Effort normal.  Abdominal: Soft. He exhibits no distension. There is no tenderness.  Right RUQ scar  Musculoskeletal: Normal range of motion.  Neurological: He is alert and oriented to person, place, and time.  Skin: Skin is warm and dry.  Vitals reviewed.    Studies: EGD 12/2016 Dr. [name]  Diffuse submucosal gastric petechiae without ulcer or infiltrating process seen. Pylorus patent. Multiple small erosions and ulcerations within the bulb extending well into the second portion of the duodenum. Bulging ampulla of Vater with fresh blood at the orifice. Prominent extrinsic compression, at least 3-4 cm, on duodenal wall just distal to ampulla.  MRI/MRCP- 12/2016  Poor quality, no gross ascites  IMPRESSION: 1. Moderate to markedly motion degraded exam. 2. Given this limitation, no dominant pancreatic mass is see n. Findings suspicious for non complicated pancreatitis.  [place] - 02/2017 Personally reviewed CBD 93mm  IMPRESSION: 1. Positive for cholelithiasis with mild gallbladder wall thickening, but no Sonographic Percell Miller Sign was elicited to strongly indicate Acute Cholecystitis. If there is clinical suspicion of Acute Cholecystitis, nuclear medicine hepatobiliary scan to evaluate cystic duct patency may be valuable. There is no evidence of common bile duct obstruction. 2. Left renal cystic disease of hemodialysis. Surgically absent right kidney. 3. Benign hepatic cysts.  HIDA 03/2017 gallbladder fills, and  no ejection with CCK- dyskinesia IMPRESSION: Patent biliary tree. No gallbladder response to CCK stimulation; calculated gallbladder ejection fraction is 0%.   CT a/p 2/18 IMPRESSION: New pancreatic tail 5.1 x 4.1 x 3.9 cm fluid appearing collection.  Adjacent free fluid extends along the anterior superior margin of Gerota's fascia. No obstructing mass seen as cause of this finding and may reflect pancreatitis. Multiple calcified gallstones. Tiny amount of pericholecystic fluid/minimal gallbladder wall thickening may be present. Loculated fluid collection along the inferior aspect of the liver slightly larger than on the prior exam now measuring 7.1 x 3.5 x 7.8 cm versus prior 6 x 2.5 x 6.4 cm. Etiology indeterminate. Post right nephrectomy. Surgical bed/clips appear similar to prior exam. Multiple complex left renal lesions once again noted. A few of these appear minimally more complex than on prior examination (particularly lower pole and upper pole complex cysts) and therefore tumor not excluded. Upper pole and left lower pole complex lesion with peripheral fat, appearance suggesting result of prior ablation.  Slightly rounded lymph nodes adjacent left kidney stable.  1.1 cm low-density structure superior aspect of the spleen new from the prior examination. Etiology indeterminate.  Slightly prominent prostate gland. Aortic Atherosclerosis (ICD10-I70.0). Cardiomegaly.  Coronary artery calcification. Basilar atelectasis.  EUS- Dr. Ardis Hughs Fluid testing results: Cytology: no malignant appearing cells. CEA 22 ng/mL (low) Amylase 18,227 U/L (high) There are no signs of cancer in the fluid.  I am most suspicious that it was a pseudocyst and I suspect it will resolve with time.     Assessment:  62 y.o. yo Male with evidence of gallstones and patent biliary tree on HIDA but with EF 0.  He does have stones and dyskinesia by definition, but again is having no symptoms. He also has a very  Large RUQ scar from his nephrectomy and lesions on the CT in the left kidney that are concerning for possible cancer.  At this time, we have discussed that he is having no biliary type symptoms, and that yes he has stones but removing his gallbladder would not be without risk, particularly in him with multiple comorbidities and the prior RUQ incision.  He would likely need an open cholecystectomy, multiple days in the hospital, a period of holding his coumadin, and the risk of a cholecystectomy including bleeding, infection, and CBD injury, which could be catastrophic in someone with his issues.   Plan:  -No indication for cholecystectomy at this time due to no clear symptoms. His only symptom of a bitter taste in his mouth/ reflux improved with protonix.  - He can continue to get his protonix prescriptions from his PCP or GI.  - If something changes in the future, having pain, etc, then we can discuss the need and the risk and benefits of cholecystectomy at that time  - Follow up as needed   All of the above recommendations were discussed with the patient, and all of patient's questions were answered to his expressed satisfaction. He seemed pleased and did not want to have  his gallbladder out if not indicated. Information on cholelithiasis given to him for review.  Curlene Labrum, MD [facility] [address] [place], [place] [address] [phone] (office)

## 2017-05-05 NOTE — Patient Instructions (Signed)
Cholelithiasis Cholelithiasis is also called "gallstones." It is a kind of gallbladder disease. The gallbladder is an organ that stores a liquid (bile) that helps you digest fat. Gallstones may not cause symptoms (may be silent gallstones) until they cause a blockage, and then they can cause pain (gallbladder attack). Follow these instructions at home:  Take over-the-counter and prescription medicines only as told by your doctor.  Stay at a healthy weight.  Eat healthy foods. This includes: ? Eating fewer fatty foods, like fried foods. ? Eating fewer refined carbs (refined carbohydrates). Refined carbs are breads and grains that are highly processed, like white bread and white rice. Instead, choose whole grains like whole-wheat bread and brown rice. ? Eating more fiber. Almonds, fresh fruit, and beans are healthy sources of fiber.  Keep all follow-up visits as told by your doctor. This is important. Contact a doctor if:  You have sudden pain in the upper right side of your belly (abdomen). Pain might spread to your right shoulder or your chest. This may be a sign of a gallbladder attack.  You feel sick to your stomach (are nauseous).  You throw up (vomit).  You have been diagnosed with gallstones that have no symptoms and you get: ? Belly pain. ? Discomfort, burning, or fullness in the upper part of your belly (indigestion). Get help right away if:  You have sudden pain in the upper right side of your belly, and it lasts for more than 2 hours.  You have belly pain that lasts for more than 5 hours.  You have a fever or chills.  You keep feeling sick to your stomach or you keep throwing up.  Your skin or the whites of your eyes turn yellow (jaundice).  You have dark-colored pee (urine).  You have light-colored poop (stool). Summary  Cholelithiasis is also called "gallstones."  The gallbladder is an organ that stores a liquid (bile) that helps you digest fat.  Silent  gallstones are gallstones that do not cause symptoms.  A gallbladder attack may cause sudden pain in the upper right side of your belly. Pain might spread to your right shoulder or your chest. If this happens, contact your doctor.  If you have sudden pain in the upper right side of your belly that lasts for more than 2 hours, get help right away. This information is not intended to replace advice given to you by your health care provider. Make sure you discuss any questions you have with your health care provider. Document Released: 07/28/2007 Document Revised: 10/26/2015 Document Reviewed: 10/26/2015 Elsevier Interactive Patient Education  2017 [employer]  

## 2017-05-06 ENCOUNTER — Ambulatory Visit (HOSPITAL_COMMUNITY)
Admission: RE | Admit: 2017-05-06 | Discharge: 2017-05-06 | Disposition: A | Discharge status: Home / Self Care | Payer: Medicare HMO | Source: Ambulatory Visit | Attending: Urology | Admitting: Urology

## 2017-05-06 ENCOUNTER — Other Ambulatory Visit: Payer: Self-pay | Admitting: Urology

## 2017-05-06 DIAGNOSIS — K862 Cyst of pancreas: Secondary | ICD-10-CM | POA: Diagnosis not present

## 2017-05-06 DIAGNOSIS — D3002 Benign neoplasm of left kidney: Secondary | ICD-10-CM | POA: Insufficient documentation

## 2017-05-06 DIAGNOSIS — K8020 Calculus of gallbladder without cholecystitis without obstruction: Secondary | ICD-10-CM | POA: Insufficient documentation

## 2017-05-06 DIAGNOSIS — N2889 Other specified disorders of kidney and ureter: Secondary | ICD-10-CM | POA: Diagnosis not present

## 2017-05-06 DIAGNOSIS — Q6102 Congenital multiple renal cysts: Secondary | ICD-10-CM | POA: Insufficient documentation

## 2017-05-07 DIAGNOSIS — Z992 Dependence on renal dialysis: Secondary | ICD-10-CM | POA: Diagnosis not present

## 2017-05-07 DIAGNOSIS — N186 End stage renal disease: Secondary | ICD-10-CM | POA: Diagnosis not present

## 2017-05-09 DIAGNOSIS — D49512 Neoplasm of unspecified behavior of left kidney: Secondary | ICD-10-CM | POA: Diagnosis not present

## 2017-05-10 DIAGNOSIS — N186 End stage renal disease: Secondary | ICD-10-CM | POA: Diagnosis not present

## 2017-05-10 DIAGNOSIS — Z992 Dependence on renal dialysis: Secondary | ICD-10-CM | POA: Diagnosis not present

## 2017-05-11 DIAGNOSIS — Z794 Long term (current) use of insulin: Secondary | ICD-10-CM | POA: Diagnosis not present

## 2017-05-11 DIAGNOSIS — E1121 Type 2 diabetes mellitus with diabetic nephropathy: Secondary | ICD-10-CM | POA: Diagnosis not present

## 2017-05-11 DIAGNOSIS — N189 Chronic kidney disease, unspecified: Secondary | ICD-10-CM | POA: Diagnosis not present

## 2017-05-11 DIAGNOSIS — N529 Male erectile dysfunction, unspecified: Secondary | ICD-10-CM | POA: Diagnosis not present

## 2017-05-11 DIAGNOSIS — Z6828 Body mass index (BMI) 28.0-28.9, adult: Secondary | ICD-10-CM | POA: Diagnosis not present

## 2017-05-11 DIAGNOSIS — K8020 Calculus of gallbladder without cholecystitis without obstruction: Secondary | ICD-10-CM | POA: Diagnosis not present

## 2017-05-11 DIAGNOSIS — E1129 Type 2 diabetes mellitus with other diabetic kidney complication: Secondary | ICD-10-CM | POA: Diagnosis not present

## 2017-05-11 DIAGNOSIS — Z1389 Encounter for screening for other disorder: Secondary | ICD-10-CM | POA: Diagnosis not present

## 2017-05-11 DIAGNOSIS — Z992 Dependence on renal dialysis: Secondary | ICD-10-CM | POA: Diagnosis not present

## 2017-05-11 DIAGNOSIS — Z0001 Encounter for general adult medical examination with abnormal findings: Secondary | ICD-10-CM | POA: Diagnosis not present

## 2017-05-11 DIAGNOSIS — I4891 Unspecified atrial fibrillation: Secondary | ICD-10-CM | POA: Diagnosis not present

## 2017-05-11 DIAGNOSIS — C642 Malignant neoplasm of left kidney, except renal pelvis: Secondary | ICD-10-CM | POA: Diagnosis not present

## 2017-05-11 DIAGNOSIS — D689 Coagulation defect, unspecified: Secondary | ICD-10-CM | POA: Diagnosis not present

## 2017-05-11 DIAGNOSIS — C641 Malignant neoplasm of right kidney, except renal pelvis: Secondary | ICD-10-CM | POA: Diagnosis not present

## 2017-05-11 DIAGNOSIS — E11649 Type 2 diabetes mellitus with hypoglycemia without coma: Secondary | ICD-10-CM | POA: Diagnosis not present

## 2017-05-12 DIAGNOSIS — Z992 Dependence on renal dialysis: Secondary | ICD-10-CM | POA: Diagnosis not present

## 2017-05-12 DIAGNOSIS — N186 End stage renal disease: Secondary | ICD-10-CM | POA: Diagnosis not present

## 2017-05-13 ENCOUNTER — Encounter: Payer: Self-pay | Admitting: Nurse Practitioner

## 2017-05-13 ENCOUNTER — Ambulatory Visit (INDEPENDENT_AMBULATORY_CARE_PROVIDER_SITE_OTHER): Payer: Medicare HMO | Admitting: Nurse Practitioner

## 2017-05-13 VITALS — BP 131/83 | HR 116 | Temp 97.0°F | Ht 74.0 in | Wt 218.0 lb

## 2017-05-13 DIAGNOSIS — K862 Cyst of pancreas: Secondary | ICD-10-CM | POA: Insufficient documentation

## 2017-05-13 DIAGNOSIS — K8020 Calculus of gallbladder without cholecystitis without obstruction: Secondary | ICD-10-CM | POA: Diagnosis not present

## 2017-05-13 DIAGNOSIS — K5900 Constipation, unspecified: Secondary | ICD-10-CM

## 2017-05-13 DIAGNOSIS — R1900 Intra-abdominal and pelvic swelling, mass and lump, unspecified site: Secondary | ICD-10-CM | POA: Diagnosis not present

## 2017-05-13 NOTE — Patient Instructions (Signed)
1. Continue your current medications. 2. Continue to follow-up with your other specialists as they recommend. 3. Follow-up in our office in 6 months. 4. Call us if you have any questions or concerns.

## 2017-05-13 NOTE — Assessment & Plan Note (Signed)
Known cholelithiasis with HIDA scan ejection fraction 0.  He is not currently symptomatic.  He has been evaluated by surgery who does not feel surgical intervention is warranted at this time.  Recommend he follow-up with them as needed.  Follow-up here in 6 months.

## 2017-05-13 NOTE — Assessment & Plan Note (Signed)
Constipation is improved on fiber.  This seems to be adequate control for him.  Recommend he continue this.  Contact us if any worsening symptoms and we can recommend additional measures.  Follow-up in 6 months.

## 2017-05-13 NOTE — Progress Notes (Signed)
Referring Provider: Redmond School, MD Primary Care Physician:  Redmond School, MD Primary GI:  Dr. [name]  Chief Complaint  Patient presents with  . Constipation    improved. BM 1-2 times per day, not straining  . abdominal swelling    still has going on    HPI:   Albert [name] is a 62 y.o. male who presents for follow-up on constipation and abdominal swelling.  The patient was last seen in our office 03/11/2017 for the same.  Noted history of renal cell carcinoma.  More recent history of MRI with no dominant pancreatic mass seen but other findings suspicious for non-complicated pancreatitis.  Recommended follow-up on gastric biopsies and consider EUS for abnormal EGD.  EGD most recently completed 01/05/2017 and outlined as per below.  Noted to be doing well overall.  Tolerating hemodialysis, good energy.  Concerns about abdominal swelling.  Although he noted he did not feel his abdomen is swelling stating "it has been like this for a while."  Some intermittent abdominal soreness, constipation doing well on fiber.  He feels his abdominal soreness is triggered by the drink he takes for dialysis (nephro supplement).  Denies overt GERD symptoms.  Commended ultrasound of the abdomen, follow-up in 2 months.  EGD 01/05/2017 which found normal esophagus, diffuse submucosal gastric petechiae status post biopsy, multiple abnormalities involving the duodenum.  Concerning for underlying malignancy.  Upper GI bleed deemed likely due to hemobilia or Hemosucus Pancreaticus.  Recommended MRI follow-up.  Surgical pathology found the biopsies to be foveolar glands hyperplasia and fundic gland polyp.  No H. pylori.  No dysplasia or malignancy.    The ultrasound completed after his last appointment found multiple gallstones and possibly very mild gallbladder inflammation.  HIDA scan was ordered on follow-up which showed an ejection fraction of 0%.  He was referred to surgery.  He did not feel he needed  surgery at that time.  Requested follow-up CT which was completed 04/11/2017.  Findings as outlined below:   CT IMPRESSION: New pancreatic tail 5.1 x 4.1 x 3.9 cm fluid appearing collection. Adjacent free fluid extends along the anterior superior margin of Gerota's fascia. No obstructing mass seen as cause of this finding and may reflect pancreatitis.  Multiple calcified gallstones. Tiny amount of pericholecystic fluid/minimal gallbladder wall thickening may be present.  Loculated fluid collection along the inferior aspect of the liver slightly larger than on the prior exam now measuring 7.1 x 3.5 x 7.8 cm versus prior 6 x 2.5 x 6.4 cm. Etiology indeterminate.  Post right nephrectomy. Surgical bed/clips appear similar to prior exam.  Multiple complex left renal lesions once again noted. A few of these appear minimally more complex than on prior examination (particularly lower pole and upper pole complex cysts) and therefore tumor not excluded. Upper pole and left lower pole complex lesion with peripheral fat, appearance suggesting result of prior ablation.  Slightly rounded lymph nodes adjacent left kidney stable.  1.1 cm low-density structure superior aspect of the spleen new from the prior examination. Etiology indeterminate.  Slightly prominent prostate gland.  Aortic Atherosclerosis (ICD10-I70.0).  Cardiomegaly.  Coronary artery calcification.  Basilar atelectasis.  He underwent EUS 04/21/2017 which found no findings consistent with EGD completed 12/2016 and felt to be acute issue that has since resolved.  Noted cholelithiasis.  Noted mixed solid/cystic fluid collection in the tail the pancreas which was completely drained and sent to cytology.  Cytology found no malignant cells.  CEA 22 (low).  Patient  was told no signs of cancer, most suspicious it was likely a pseudocyst that will resolve with time.  Surgery saw him in follow-up and again recommended no surgical  intervention.  Today he states hes doing well overall. Reviewed multiple results with the patient. Constipation is better, still on fiber and no other treatments. Denies any abdominal pain, N/V, hematochezia, melena, fever, chills. Has lost 3-5 lbs in the past couple months. Appetite comes and goes. Still doesn't feel like his abdomen is swollen. Denies chest pain, dyspnea, dizziness, lightheadedness, syncope, near syncope. Denies any other upper or lower GI symptoms.  He states MRI completed with [facility]. May need nephrectomy in the future.  MRI Kidney updated which found an enlarging, now 5 cm, lesion concerning for solid neoplasm along the anterior margin of the left kidney.  Complex cystic lesions throughout the left kidney without significant change.  Mild decrease in volume of cystic lesion at the tail of the pancreas most consistent with pseudocyst.  No evidence of acute pancreatitis.  Noted cholelithiasis.   Past Medical History:  Diagnosis Date  . Anemia   . Atrial fibrillation ([place])   . Cardiomyopathy    LVEF 50-55% 8/11 - [facility]  . Colonic polyp   . Diverticulosis of colon   . ESRD on hemodialysis ([place])    Dr. Lowanda Foster [facility]  . Essential hypertension, benign   . Gout   . Mixed hyperlipidemia   . OSA (obstructive sleep apnea)    CPAP  . Renal cell cancer ([place])   . Type 2 diabetes mellitus ([place])    Type II    Past Surgical History:  Procedure Laterality Date  . BIOPSY  01/05/2017   Procedure: BIOPSY;  Surgeon: Daneil Dolin, MD;  Location: [facility];  Service: Endoscopy;;  gastric  . COLONOSCOPY N/A 01/07/2016   Dr. [name]: Grade 1 internal hemorrhoids, otherwise normal  . COLONOSCOPY W/ POLYPECTOMY  2009, 2012   2009: Dr. [name]: 1.25 cm adenomatous polyp without high grade dysplasia, distal to IC valve. 2012: normal rectum, few pancolonic diverticula, single diminutive hyperplastic polyp  . ESOPHAGOGASTRODUODENOSCOPY  2009   Dr. [name]: accentuated  undulating Z-lin with couple of islands of salmon-colored epithelium suspicious for Barrett's, s/p biopsy with path revealing mild inflammation, no metaplasia, dysplasia, or malignancy  . ESOPHAGOGASTRODUODENOSCOPY N/A 01/05/2017   Procedure: ESOPHAGOGASTRODUODENOSCOPY (EGD);  Surgeon: Daneil Dolin, MD;  Location: [facility];  Service: Endoscopy;  Laterality: N/A;  . EUS N/A 04/21/2017   Procedure: UPPER ENDOSCOPIC ULTRASOUND (EUS) LINEAR;  Surgeon: Milus Banister, MD;  Location: [facility];  Service: Endoscopy;  Laterality: N/A;  . FISTULA SUPERFICIALIZATION Left 03/31/2015   Procedure: CEPHALIC VEIN TURNDOWN; PLICATION OF BRACHIOCEPHALIC AVF;  Surgeon: Conrad Cartwright, MD;  Location: [place];  Service: Vascular;  Laterality: Left;  . FISTULOGRAM N/A 10/19/2013   Procedure: FISTULOGRAM;  Surgeon: Rosetta Posner, MD;  Location[facility] CATH LAB;  Service: Cardiovascular;  Laterality: N/A;  . Left arm AV fistula  2009   Dr. [name]  . PERIPHERAL VASCULAR CATHETERIZATION N/A 03/10/2015   Procedure: Fistulagram;  Surgeon: Conrad Perryville, MD;  Location: [place] CV LAB;  Service: Cardiovascular;  Laterality: N/A;  . Right nephrectomy      Current Outpatient Medications  Medication Sig Dispense Refill  . allopurinol (ZYLOPRIM) 100 MG tablet Take 100 mg by mouth daily.     . blood glucose meter kit and supplies Dispense based on patient and insurance preference. Use up to  four times daily as directed. (FOR ICD-9 250.00, 250.01). 1 each 0  . CARTIA XT 120 MG 24 hr capsule Take 120 mg by mouth daily.     . carvedilol (COREG) 25 MG tablet TAKE 1 TABLET TWICE DAILY WITH MEALS (Patient taking differently: TAKE 1/2 TABLET TWICE DAILY WITH MEALS) 180 tablet 3  . cinacalcet (SENSIPAR) 30 MG tablet Take 30 mg by mouth at bedtime.     . docusate sodium (COLACE) 100 MG capsule Take 100 mg by mouth daily.    . fenofibrate 160 MG tablet Take 160 mg every evening by mouth.     . fish oil-omega-3 fatty acids 1000 MG  capsule Take 1 g by mouth 3 (three) times daily.     Marland Kitchen glipiZIDE (GLUCOTROL XL) 5 MG 24 hr tablet Take 5 mg by mouth daily.     . Insulin Pen Needle 31G X 5 MM MISC Use as directed 30 each 0  . pantoprazole (PROTONIX) 40 MG tablet Take 1 tablet (40 mg total) by mouth daily. 30 tablet 1  . pravastatin (PRAVACHOL) 10 MG tablet Take 5 mg by mouth every evening.     . sevelamer carbonate (RENVELA) 800 MG tablet Take 1,600-3,200 mg by mouth See admin instructions. Take 3200 mg after each meal and 1600 mg each snack    . Tetrahydrozoline HCl (VISINE OP) Place 2 drops into both eyes daily as needed (dry eyes).    . TRADJENTA 5 MG TABS tablet daily.    Marland Kitchen warfarin (COUMADIN) 2.5 MG tablet TAKE 1 TABLET EVERY DAY EXCEPT TAKE  1/2 TABLET ON SUNDAYS, TUESDAYS AND THURSDAYS OR AS DIRECTED (Patient taking differently: TAKE 1 TABLET EVERY DAY EXCEPT TAKE  1/2 TABLET ON SUNDAYS AND THURSDAYS OR AS DIRECTED) 135 tablet 3  . Wheat Dextrin (BENEFIBER DRINK MIX PO) Take 1 Dose by mouth daily. 1 tbsp      No current facility-administered medications for this visit.     Allergies as of 05/13/2017 - Review Complete 05/13/2017  Allergen Reaction Noted  . Metformin and related Other (See Comments) 01/18/2017    Family History  Problem Relation Age of Onset  . Hypertension Mother   . Hypertension Unknown        Siblings  . Diabetes type II Unknown        Siblings  . Colon cancer Neg Hx     Social History   Socioeconomic History  . Marital status: Single    Spouse name: Not on file  . Number of children: Not on file  . Years of education: Not on file  . Highest education level: Not on file  Occupational History  . Not on file  Social Needs  . Financial resource strain: Not on file  . Food insecurity:    Worry: Not on file    Inability: Not on file  . Transportation needs:    Medical: Not on file    Non-medical: Not on file  Tobacco Use  . Smoking status: Former Smoker    Types: Cigarettes     Last attempt to quit: 07/04/1998    Years since quitting: 18.8  . Smokeless tobacco: Never Used  . Tobacco comment: Quit May 2000  Substance and Sexual Activity  . Alcohol use: No    Alcohol/week: 0.0 oz    Comment: Quit 2014; Previously drank about 3 drinks a day  . Drug use: No  . Sexual activity: Not on file  Lifestyle  . Physical activity:  Days per week: Not on file    Minutes per session: Not on file  . Stress: Not on file  Relationships  . Social connections:    Talks on phone: Not on file    Gets together: Not on file    Attends religious service: Not on file    Active member of club or organization: Not on file    Attends meetings of clubs or organizations: Not on file    Relationship status: Not on file  Other Topics Concern  . Not on file  Social History Narrative  . Not on file    Review of Systems: General: Negative for anorexia, weight loss, fever, chills, fatigue, weakness. ENT: Negative for hoarseness, difficulty swallowing. CV: Negative for chest pain, angina, palpitations, peripheral edema.  Respiratory: Negative for dyspnea at rest, cough, sputum, wheezing.  GI: See history of present illness. Endo: Negative for unusual weight change.  Heme: Negative for bruising or bleeding. Allergy: Negative for rash or hives.   Physical Exam: BP 131/83   Pulse (!) 116   Temp (!) 97 F (36.1 C) (Oral)   Ht '6\' 2"'  (1.88 m)   Wt 218 lb (98.9 kg)   BMI 27.99 kg/m  General:   Alert and oriented. Pleasant and cooperative. Well-nourished and well-developed.  Eyes:  Without icterus, sclera clear and conjunctiva pink.  Ears:  Normal auditory acuity. Cardiovascular:  Rhythm irregularly irregular (consistent with AFib), without murmurs appreciated. Extremities without clubbing or edema. Respiratory:  Clear to auscultation bilaterally. No wheezes, rales, or rhonchi. No distress.  Gastrointestinal:  +BS, soft, non-tender and non-distended. No HSM noted. No guarding or  rebound. No masses appreciated.  Rectal:  Deferred  Musculoskalatal:  Symmetrical without gross deformities. Neurologic:  Alert and oriented x4;  grossly normal neurologically. Psych:  Alert and cooperative. Normal mood and affect. Heme/Lymph/Immune: No excessive bruising noted.    05/13/2017 11:02 AM   Disclaimer: This note was dictated with voice recognition software. Similar sounding words can inadvertently be transcribed and may not be corrected upon review.

## 2017-05-13 NOTE — Assessment & Plan Note (Signed)
He was previously referred for what was felt to be abdominal swelling.  He again feels that his abdomen is not swollen.  On exam his abdomen is soft and nondistended.  Recommend follow-up as needed for any noted abdominal swelling.

## 2017-05-13 NOTE — Assessment & Plan Note (Signed)
Pancreatic abnormalities which are been thoroughly evaluated with EGD, EUS, fine-needle aspiration.  Cytology found no metastatic cells.  This is felt to likely be cystic lesion which was nearly completely drained during EUS.  Follow-up MRI for his history of renal cell carcinoma found the cyst again appears but is minimally smaller.  It was the opinion of the EUS endoscopist that cyst would likely resolve on its own.  We will continue to monitor.  Follow-up in 6 months.

## 2017-05-14 DIAGNOSIS — N186 End stage renal disease: Secondary | ICD-10-CM | POA: Diagnosis not present

## 2017-05-14 DIAGNOSIS — Z992 Dependence on renal dialysis: Secondary | ICD-10-CM | POA: Diagnosis not present

## 2017-05-16 NOTE — Progress Notes (Signed)
cc'ed to pcp °

## 2017-05-17 DIAGNOSIS — N186 End stage renal disease: Secondary | ICD-10-CM | POA: Diagnosis not present

## 2017-05-17 DIAGNOSIS — Z992 Dependence on renal dialysis: Secondary | ICD-10-CM | POA: Diagnosis not present

## 2017-05-18 ENCOUNTER — Ambulatory Visit (INDEPENDENT_AMBULATORY_CARE_PROVIDER_SITE_OTHER): Payer: Medicare HMO | Admitting: *Deleted

## 2017-05-18 DIAGNOSIS — Z5181 Encounter for therapeutic drug level monitoring: Secondary | ICD-10-CM | POA: Diagnosis not present

## 2017-05-18 DIAGNOSIS — I4891 Unspecified atrial fibrillation: Secondary | ICD-10-CM | POA: Diagnosis not present

## 2017-05-18 LAB — POCT INR: INR: 2.1

## 2017-05-18 NOTE — Patient Instructions (Signed)
Increase coumadin to 1 tablet daily except 1/2 tablet on Sunday  Recheck in 3 weeks

## 2017-05-19 DIAGNOSIS — Z992 Dependence on renal dialysis: Secondary | ICD-10-CM | POA: Diagnosis not present

## 2017-05-19 DIAGNOSIS — N186 End stage renal disease: Secondary | ICD-10-CM | POA: Diagnosis not present

## 2017-05-21 DIAGNOSIS — N186 End stage renal disease: Secondary | ICD-10-CM | POA: Diagnosis not present

## 2017-05-21 DIAGNOSIS — Z992 Dependence on renal dialysis: Secondary | ICD-10-CM | POA: Diagnosis not present

## 2017-05-22 DIAGNOSIS — N186 End stage renal disease: Secondary | ICD-10-CM | POA: Diagnosis not present

## 2017-05-22 DIAGNOSIS — Z992 Dependence on renal dialysis: Secondary | ICD-10-CM | POA: Diagnosis not present

## 2017-05-23 DIAGNOSIS — Z992 Dependence on renal dialysis: Secondary | ICD-10-CM | POA: Diagnosis not present

## 2017-05-23 DIAGNOSIS — N186 End stage renal disease: Secondary | ICD-10-CM | POA: Diagnosis not present

## 2017-05-24 ENCOUNTER — Telehealth: Payer: Self-pay | Admitting: Cardiology

## 2017-05-24 DIAGNOSIS — Z992 Dependence on renal dialysis: Secondary | ICD-10-CM | POA: Diagnosis not present

## 2017-05-24 DIAGNOSIS — N186 End stage renal disease: Secondary | ICD-10-CM | POA: Diagnosis not present

## 2017-05-24 NOTE — Telephone Encounter (Signed)
Please give pt a call-- he's starting to get tired very easily and has some fluttering in his heart at times. Please give him a call concerning his medications.

## 2017-05-25 NOTE — Telephone Encounter (Signed)
Called pt. No answer, left message for pt to return call.  

## 2017-05-26 DIAGNOSIS — N186 End stage renal disease: Secondary | ICD-10-CM | POA: Diagnosis not present

## 2017-05-26 DIAGNOSIS — Z992 Dependence on renal dialysis: Secondary | ICD-10-CM | POA: Diagnosis not present

## 2017-05-28 DIAGNOSIS — Z992 Dependence on renal dialysis: Secondary | ICD-10-CM | POA: Diagnosis not present

## 2017-05-28 DIAGNOSIS — N186 End stage renal disease: Secondary | ICD-10-CM | POA: Diagnosis not present

## 2017-05-30 MED ORDER — DILTIAZEM HCL ER COATED BEADS 180 MG PO CP24: 180.0000 mg | ORAL_CAPSULE | Freq: Every day | ORAL | 3 refills | Status: DC

## 2017-05-30 NOTE — Telephone Encounter (Signed)
Will consider going back to the 180 mg dose.

## 2017-05-30 NOTE — Telephone Encounter (Signed)
Patient had Cartia lowered from 300 mg to 180 mg to 120 mg by Dr Gerarda Fraction because patient was having hypotension at dialysis. Since then ,his BP's are normal per patient.Now he had fluttering in his heart. He wonders what doe he should go back to.

## 2017-05-30 NOTE — Telephone Encounter (Signed)
Pt requested rx be sent to [facility] mail order. Dose is now cartia 180 mg daily

## 2017-05-31 DIAGNOSIS — Z992 Dependence on renal dialysis: Secondary | ICD-10-CM | POA: Diagnosis not present

## 2017-05-31 DIAGNOSIS — N186 End stage renal disease: Secondary | ICD-10-CM | POA: Diagnosis not present

## 2017-05-31 DIAGNOSIS — E119 Type 2 diabetes mellitus without complications: Secondary | ICD-10-CM | POA: Diagnosis not present

## 2017-06-02 DIAGNOSIS — N186 End stage renal disease: Secondary | ICD-10-CM | POA: Diagnosis not present

## 2017-06-02 DIAGNOSIS — Z992 Dependence on renal dialysis: Secondary | ICD-10-CM | POA: Diagnosis not present

## 2017-06-04 DIAGNOSIS — N186 End stage renal disease: Secondary | ICD-10-CM | POA: Diagnosis not present

## 2017-06-04 DIAGNOSIS — Z992 Dependence on renal dialysis: Secondary | ICD-10-CM | POA: Diagnosis not present

## 2017-06-06 DIAGNOSIS — H2513 Age-related nuclear cataract, bilateral: Secondary | ICD-10-CM | POA: Diagnosis not present

## 2017-06-06 DIAGNOSIS — H04123 Dry eye syndrome of bilateral lacrimal glands: Secondary | ICD-10-CM | POA: Diagnosis not present

## 2017-06-06 DIAGNOSIS — E1165 Type 2 diabetes mellitus with hyperglycemia: Secondary | ICD-10-CM | POA: Diagnosis not present

## 2017-06-06 DIAGNOSIS — E119 Type 2 diabetes mellitus without complications: Secondary | ICD-10-CM | POA: Diagnosis not present

## 2017-06-07 DIAGNOSIS — Z992 Dependence on renal dialysis: Secondary | ICD-10-CM | POA: Diagnosis not present

## 2017-06-07 DIAGNOSIS — N186 End stage renal disease: Secondary | ICD-10-CM | POA: Diagnosis not present

## 2017-06-08 ENCOUNTER — Ambulatory Visit (INDEPENDENT_AMBULATORY_CARE_PROVIDER_SITE_OTHER): Payer: Medicare HMO | Admitting: *Deleted

## 2017-06-08 DIAGNOSIS — Z5181 Encounter for therapeutic drug level monitoring: Secondary | ICD-10-CM | POA: Diagnosis not present

## 2017-06-08 DIAGNOSIS — I4891 Unspecified atrial fibrillation: Secondary | ICD-10-CM

## 2017-06-08 LAB — POCT INR: INR: 3.8

## 2017-06-08 NOTE — Patient Instructions (Signed)
Hold coumadin tonight then resume 1 tablet daily except 1/2 tablet on Sunday  Start back eating greens as before Recheck in 3 weeks

## 2017-06-09 DIAGNOSIS — N186 End stage renal disease: Secondary | ICD-10-CM | POA: Diagnosis not present

## 2017-06-09 DIAGNOSIS — Z992 Dependence on renal dialysis: Secondary | ICD-10-CM | POA: Diagnosis not present

## 2017-06-11 DIAGNOSIS — N186 End stage renal disease: Secondary | ICD-10-CM | POA: Diagnosis not present

## 2017-06-11 DIAGNOSIS — Z992 Dependence on renal dialysis: Secondary | ICD-10-CM | POA: Diagnosis not present

## 2017-06-14 DIAGNOSIS — Z992 Dependence on renal dialysis: Secondary | ICD-10-CM | POA: Diagnosis not present

## 2017-06-14 DIAGNOSIS — N186 End stage renal disease: Secondary | ICD-10-CM | POA: Diagnosis not present

## 2017-06-16 DIAGNOSIS — N186 End stage renal disease: Secondary | ICD-10-CM | POA: Diagnosis not present

## 2017-06-16 DIAGNOSIS — Z992 Dependence on renal dialysis: Secondary | ICD-10-CM | POA: Diagnosis not present

## 2017-06-18 DIAGNOSIS — Z992 Dependence on renal dialysis: Secondary | ICD-10-CM | POA: Diagnosis not present

## 2017-06-18 DIAGNOSIS — N186 End stage renal disease: Secondary | ICD-10-CM | POA: Diagnosis not present

## 2017-06-21 DIAGNOSIS — N186 End stage renal disease: Secondary | ICD-10-CM | POA: Diagnosis not present

## 2017-06-21 DIAGNOSIS — Z992 Dependence on renal dialysis: Secondary | ICD-10-CM | POA: Diagnosis not present

## 2017-06-22 DIAGNOSIS — Z992 Dependence on renal dialysis: Secondary | ICD-10-CM | POA: Diagnosis not present

## 2017-06-22 DIAGNOSIS — N186 End stage renal disease: Secondary | ICD-10-CM | POA: Diagnosis not present

## 2017-06-23 DIAGNOSIS — Z992 Dependence on renal dialysis: Secondary | ICD-10-CM | POA: Diagnosis not present

## 2017-06-23 DIAGNOSIS — N186 End stage renal disease: Secondary | ICD-10-CM | POA: Diagnosis not present

## 2017-06-25 DIAGNOSIS — N186 End stage renal disease: Secondary | ICD-10-CM | POA: Diagnosis not present

## 2017-06-25 DIAGNOSIS — Z992 Dependence on renal dialysis: Secondary | ICD-10-CM | POA: Diagnosis not present

## 2017-06-28 DIAGNOSIS — N186 End stage renal disease: Secondary | ICD-10-CM | POA: Diagnosis not present

## 2017-06-28 DIAGNOSIS — Z992 Dependence on renal dialysis: Secondary | ICD-10-CM | POA: Diagnosis not present

## 2017-06-30 DIAGNOSIS — Z992 Dependence on renal dialysis: Secondary | ICD-10-CM | POA: Diagnosis not present

## 2017-06-30 DIAGNOSIS — N186 End stage renal disease: Secondary | ICD-10-CM | POA: Diagnosis not present

## 2017-07-02 DIAGNOSIS — Z992 Dependence on renal dialysis: Secondary | ICD-10-CM | POA: Diagnosis not present

## 2017-07-02 DIAGNOSIS — N186 End stage renal disease: Secondary | ICD-10-CM | POA: Diagnosis not present

## 2017-07-04 ENCOUNTER — Ambulatory Visit (INDEPENDENT_AMBULATORY_CARE_PROVIDER_SITE_OTHER): Payer: Medicare HMO | Admitting: Otolaryngology

## 2017-07-04 DIAGNOSIS — H903 Sensorineural hearing loss, bilateral: Secondary | ICD-10-CM | POA: Diagnosis not present

## 2017-07-05 DIAGNOSIS — Z992 Dependence on renal dialysis: Secondary | ICD-10-CM | POA: Diagnosis not present

## 2017-07-05 DIAGNOSIS — N186 End stage renal disease: Secondary | ICD-10-CM | POA: Diagnosis not present

## 2017-07-07 DIAGNOSIS — N186 End stage renal disease: Secondary | ICD-10-CM | POA: Diagnosis not present

## 2017-07-07 DIAGNOSIS — Z992 Dependence on renal dialysis: Secondary | ICD-10-CM | POA: Diagnosis not present

## 2017-07-09 DIAGNOSIS — N186 End stage renal disease: Secondary | ICD-10-CM | POA: Diagnosis not present

## 2017-07-09 DIAGNOSIS — Z992 Dependence on renal dialysis: Secondary | ICD-10-CM | POA: Diagnosis not present

## 2017-07-11 ENCOUNTER — Ambulatory Visit (INDEPENDENT_AMBULATORY_CARE_PROVIDER_SITE_OTHER): Payer: Medicare HMO | Admitting: *Deleted

## 2017-07-11 DIAGNOSIS — I4891 Unspecified atrial fibrillation: Secondary | ICD-10-CM

## 2017-07-11 DIAGNOSIS — Z5181 Encounter for therapeutic drug level monitoring: Secondary | ICD-10-CM | POA: Diagnosis not present

## 2017-07-11 LAB — POCT INR: INR: 2.1

## 2017-07-11 NOTE — Patient Instructions (Signed)
Continue coumadin 1 tablet daily except 1/2 tablet on Sunday  Continue greens Recheck in 4 weeks 

## 2017-07-12 DIAGNOSIS — N186 End stage renal disease: Secondary | ICD-10-CM | POA: Diagnosis not present

## 2017-07-12 DIAGNOSIS — Z992 Dependence on renal dialysis: Secondary | ICD-10-CM | POA: Diagnosis not present

## 2017-07-14 DIAGNOSIS — N186 End stage renal disease: Secondary | ICD-10-CM | POA: Diagnosis not present

## 2017-07-14 DIAGNOSIS — Z992 Dependence on renal dialysis: Secondary | ICD-10-CM | POA: Diagnosis not present

## 2017-07-16 DIAGNOSIS — Z992 Dependence on renal dialysis: Secondary | ICD-10-CM | POA: Diagnosis not present

## 2017-07-16 DIAGNOSIS — N186 End stage renal disease: Secondary | ICD-10-CM | POA: Diagnosis not present

## 2017-07-19 DIAGNOSIS — N186 End stage renal disease: Secondary | ICD-10-CM | POA: Diagnosis not present

## 2017-07-19 DIAGNOSIS — Z992 Dependence on renal dialysis: Secondary | ICD-10-CM | POA: Diagnosis not present

## 2017-07-21 DIAGNOSIS — Z992 Dependence on renal dialysis: Secondary | ICD-10-CM | POA: Diagnosis not present

## 2017-07-21 DIAGNOSIS — N186 End stage renal disease: Secondary | ICD-10-CM | POA: Diagnosis not present

## 2017-07-22 DIAGNOSIS — N186 End stage renal disease: Secondary | ICD-10-CM | POA: Diagnosis not present

## 2017-07-22 DIAGNOSIS — Z992 Dependence on renal dialysis: Secondary | ICD-10-CM | POA: Diagnosis not present

## 2017-07-23 DIAGNOSIS — Z992 Dependence on renal dialysis: Secondary | ICD-10-CM | POA: Diagnosis not present

## 2017-07-23 DIAGNOSIS — N186 End stage renal disease: Secondary | ICD-10-CM | POA: Diagnosis not present

## 2017-07-26 DIAGNOSIS — N186 End stage renal disease: Secondary | ICD-10-CM | POA: Diagnosis not present

## 2017-07-26 DIAGNOSIS — Z992 Dependence on renal dialysis: Secondary | ICD-10-CM | POA: Diagnosis not present

## 2017-07-28 DIAGNOSIS — N186 End stage renal disease: Secondary | ICD-10-CM | POA: Diagnosis not present

## 2017-07-28 DIAGNOSIS — Z992 Dependence on renal dialysis: Secondary | ICD-10-CM | POA: Diagnosis not present

## 2017-07-30 DIAGNOSIS — N186 End stage renal disease: Secondary | ICD-10-CM | POA: Diagnosis not present

## 2017-07-30 DIAGNOSIS — Z992 Dependence on renal dialysis: Secondary | ICD-10-CM | POA: Diagnosis not present

## 2017-08-02 DIAGNOSIS — N186 End stage renal disease: Secondary | ICD-10-CM | POA: Diagnosis not present

## 2017-08-02 DIAGNOSIS — Z992 Dependence on renal dialysis: Secondary | ICD-10-CM | POA: Diagnosis not present

## 2017-08-04 DIAGNOSIS — Z992 Dependence on renal dialysis: Secondary | ICD-10-CM | POA: Diagnosis not present

## 2017-08-04 DIAGNOSIS — N186 End stage renal disease: Secondary | ICD-10-CM | POA: Diagnosis not present

## 2017-08-06 DIAGNOSIS — Z992 Dependence on renal dialysis: Secondary | ICD-10-CM | POA: Diagnosis not present

## 2017-08-06 DIAGNOSIS — N186 End stage renal disease: Secondary | ICD-10-CM | POA: Diagnosis not present

## 2017-08-08 ENCOUNTER — Ambulatory Visit (INDEPENDENT_AMBULATORY_CARE_PROVIDER_SITE_OTHER): Payer: Medicare HMO | Admitting: *Deleted

## 2017-08-08 DIAGNOSIS — I4891 Unspecified atrial fibrillation: Secondary | ICD-10-CM

## 2017-08-08 DIAGNOSIS — Z5181 Encounter for therapeutic drug level monitoring: Secondary | ICD-10-CM

## 2017-08-08 LAB — POCT INR: INR: 1.7 — AB (ref 2.0–3.0)

## 2017-08-08 NOTE — Patient Instructions (Signed)
Take coumadin 2 tablets tonight then resume 1 tablet daily except 1/2 tablet on Sunday  Continue greens Recheck in 3 weeks

## 2017-08-09 DIAGNOSIS — N186 End stage renal disease: Secondary | ICD-10-CM | POA: Diagnosis not present

## 2017-08-09 DIAGNOSIS — Z992 Dependence on renal dialysis: Secondary | ICD-10-CM | POA: Diagnosis not present

## 2017-08-11 DIAGNOSIS — Z992 Dependence on renal dialysis: Secondary | ICD-10-CM | POA: Diagnosis not present

## 2017-08-11 DIAGNOSIS — N186 End stage renal disease: Secondary | ICD-10-CM | POA: Diagnosis not present

## 2017-08-13 DIAGNOSIS — Z992 Dependence on renal dialysis: Secondary | ICD-10-CM | POA: Diagnosis not present

## 2017-08-13 DIAGNOSIS — N186 End stage renal disease: Secondary | ICD-10-CM | POA: Diagnosis not present

## 2017-08-16 DIAGNOSIS — Z992 Dependence on renal dialysis: Secondary | ICD-10-CM | POA: Diagnosis not present

## 2017-08-16 DIAGNOSIS — N186 End stage renal disease: Secondary | ICD-10-CM | POA: Diagnosis not present

## 2017-08-18 DIAGNOSIS — Z992 Dependence on renal dialysis: Secondary | ICD-10-CM | POA: Diagnosis not present

## 2017-08-18 DIAGNOSIS — N186 End stage renal disease: Secondary | ICD-10-CM | POA: Diagnosis not present

## 2017-08-20 DIAGNOSIS — N186 End stage renal disease: Secondary | ICD-10-CM | POA: Diagnosis not present

## 2017-08-20 DIAGNOSIS — Z992 Dependence on renal dialysis: Secondary | ICD-10-CM | POA: Diagnosis not present

## 2017-08-21 DIAGNOSIS — Z992 Dependence on renal dialysis: Secondary | ICD-10-CM | POA: Diagnosis not present

## 2017-08-21 DIAGNOSIS — N186 End stage renal disease: Secondary | ICD-10-CM | POA: Diagnosis not present

## 2017-08-22 DIAGNOSIS — Z992 Dependence on renal dialysis: Secondary | ICD-10-CM | POA: Diagnosis not present

## 2017-08-22 DIAGNOSIS — N186 End stage renal disease: Secondary | ICD-10-CM | POA: Diagnosis not present

## 2017-08-23 DIAGNOSIS — Z992 Dependence on renal dialysis: Secondary | ICD-10-CM | POA: Diagnosis not present

## 2017-08-23 DIAGNOSIS — N186 End stage renal disease: Secondary | ICD-10-CM | POA: Diagnosis not present

## 2017-08-25 DIAGNOSIS — N186 End stage renal disease: Secondary | ICD-10-CM | POA: Diagnosis not present

## 2017-08-25 DIAGNOSIS — Z992 Dependence on renal dialysis: Secondary | ICD-10-CM | POA: Diagnosis not present

## 2017-08-27 DIAGNOSIS — Z992 Dependence on renal dialysis: Secondary | ICD-10-CM | POA: Diagnosis not present

## 2017-08-27 DIAGNOSIS — N186 End stage renal disease: Secondary | ICD-10-CM | POA: Diagnosis not present

## 2017-08-29 ENCOUNTER — Ambulatory Visit (INDEPENDENT_AMBULATORY_CARE_PROVIDER_SITE_OTHER): Payer: Medicare HMO | Admitting: *Deleted

## 2017-08-29 DIAGNOSIS — I4891 Unspecified atrial fibrillation: Secondary | ICD-10-CM

## 2017-08-29 DIAGNOSIS — Z5181 Encounter for therapeutic drug level monitoring: Secondary | ICD-10-CM

## 2017-08-29 LAB — POCT INR: INR: 4.4 — AB (ref 2.0–3.0)

## 2017-08-29 NOTE — Patient Instructions (Signed)
Hold coumadin tonight, take 1/2 tablet tomorrow night then resume 1 tablet daily except 1/2 tablet on Sunday  Continue greens Recheck in 2 weeks

## 2017-08-30 DIAGNOSIS — N186 End stage renal disease: Secondary | ICD-10-CM | POA: Diagnosis not present

## 2017-08-30 DIAGNOSIS — E119 Type 2 diabetes mellitus without complications: Secondary | ICD-10-CM | POA: Diagnosis not present

## 2017-08-30 DIAGNOSIS — Z992 Dependence on renal dialysis: Secondary | ICD-10-CM | POA: Diagnosis not present

## 2017-09-01 DIAGNOSIS — Z992 Dependence on renal dialysis: Secondary | ICD-10-CM | POA: Diagnosis not present

## 2017-09-01 DIAGNOSIS — N186 End stage renal disease: Secondary | ICD-10-CM | POA: Diagnosis not present

## 2017-09-03 DIAGNOSIS — Z992 Dependence on renal dialysis: Secondary | ICD-10-CM | POA: Diagnosis not present

## 2017-09-03 DIAGNOSIS — N186 End stage renal disease: Secondary | ICD-10-CM | POA: Diagnosis not present

## 2017-09-06 DIAGNOSIS — Z992 Dependence on renal dialysis: Secondary | ICD-10-CM | POA: Diagnosis not present

## 2017-09-06 DIAGNOSIS — N186 End stage renal disease: Secondary | ICD-10-CM | POA: Diagnosis not present

## 2017-09-08 DIAGNOSIS — N186 End stage renal disease: Secondary | ICD-10-CM | POA: Diagnosis not present

## 2017-09-08 DIAGNOSIS — Z992 Dependence on renal dialysis: Secondary | ICD-10-CM | POA: Diagnosis not present

## 2017-09-10 DIAGNOSIS — N186 End stage renal disease: Secondary | ICD-10-CM | POA: Diagnosis not present

## 2017-09-10 DIAGNOSIS — Z992 Dependence on renal dialysis: Secondary | ICD-10-CM | POA: Diagnosis not present

## 2017-09-12 ENCOUNTER — Ambulatory Visit (INDEPENDENT_AMBULATORY_CARE_PROVIDER_SITE_OTHER): Payer: Medicare HMO | Admitting: *Deleted

## 2017-09-12 DIAGNOSIS — Z992 Dependence on renal dialysis: Secondary | ICD-10-CM | POA: Diagnosis not present

## 2017-09-12 DIAGNOSIS — Z5181 Encounter for therapeutic drug level monitoring: Secondary | ICD-10-CM | POA: Diagnosis not present

## 2017-09-12 DIAGNOSIS — I4891 Unspecified atrial fibrillation: Secondary | ICD-10-CM | POA: Diagnosis not present

## 2017-09-12 DIAGNOSIS — N186 End stage renal disease: Secondary | ICD-10-CM | POA: Diagnosis not present

## 2017-09-12 LAB — POCT INR: INR: 2 (ref 2.0–3.0)

## 2017-09-12 NOTE — Patient Instructions (Signed)
Continue coumadin 1 tablet daily except 1/2 tablet on Sunday  Continue greens Recheck in 4 weeks

## 2017-09-13 DIAGNOSIS — N186 End stage renal disease: Secondary | ICD-10-CM | POA: Diagnosis not present

## 2017-09-13 DIAGNOSIS — Z992 Dependence on renal dialysis: Secondary | ICD-10-CM | POA: Diagnosis not present

## 2017-09-15 DIAGNOSIS — N186 End stage renal disease: Secondary | ICD-10-CM | POA: Diagnosis not present

## 2017-09-15 DIAGNOSIS — Z992 Dependence on renal dialysis: Secondary | ICD-10-CM | POA: Diagnosis not present

## 2017-09-17 DIAGNOSIS — N186 End stage renal disease: Secondary | ICD-10-CM | POA: Diagnosis not present

## 2017-09-17 DIAGNOSIS — Z992 Dependence on renal dialysis: Secondary | ICD-10-CM | POA: Diagnosis not present

## 2017-09-20 DIAGNOSIS — N186 End stage renal disease: Secondary | ICD-10-CM | POA: Diagnosis not present

## 2017-09-20 DIAGNOSIS — Z992 Dependence on renal dialysis: Secondary | ICD-10-CM | POA: Diagnosis not present

## 2017-09-21 DIAGNOSIS — Z992 Dependence on renal dialysis: Secondary | ICD-10-CM | POA: Diagnosis not present

## 2017-09-21 DIAGNOSIS — N186 End stage renal disease: Secondary | ICD-10-CM | POA: Diagnosis not present

## 2017-09-22 DIAGNOSIS — Z992 Dependence on renal dialysis: Secondary | ICD-10-CM | POA: Diagnosis not present

## 2017-09-22 DIAGNOSIS — N186 End stage renal disease: Secondary | ICD-10-CM | POA: Diagnosis not present

## 2017-09-24 DIAGNOSIS — N186 End stage renal disease: Secondary | ICD-10-CM | POA: Diagnosis not present

## 2017-09-24 DIAGNOSIS — Z992 Dependence on renal dialysis: Secondary | ICD-10-CM | POA: Diagnosis not present

## 2017-09-27 DIAGNOSIS — Z992 Dependence on renal dialysis: Secondary | ICD-10-CM | POA: Diagnosis not present

## 2017-09-27 DIAGNOSIS — N186 End stage renal disease: Secondary | ICD-10-CM | POA: Diagnosis not present

## 2017-09-29 DIAGNOSIS — Z992 Dependence on renal dialysis: Secondary | ICD-10-CM | POA: Diagnosis not present

## 2017-09-29 DIAGNOSIS — N186 End stage renal disease: Secondary | ICD-10-CM | POA: Diagnosis not present

## 2017-10-01 DIAGNOSIS — Z992 Dependence on renal dialysis: Secondary | ICD-10-CM | POA: Diagnosis not present

## 2017-10-01 DIAGNOSIS — N186 End stage renal disease: Secondary | ICD-10-CM | POA: Diagnosis not present

## 2017-10-04 DIAGNOSIS — N186 End stage renal disease: Secondary | ICD-10-CM | POA: Diagnosis not present

## 2017-10-04 DIAGNOSIS — Z992 Dependence on renal dialysis: Secondary | ICD-10-CM | POA: Diagnosis not present

## 2017-10-06 DIAGNOSIS — N186 End stage renal disease: Secondary | ICD-10-CM | POA: Diagnosis not present

## 2017-10-06 DIAGNOSIS — Z992 Dependence on renal dialysis: Secondary | ICD-10-CM | POA: Diagnosis not present

## 2017-10-08 DIAGNOSIS — Z992 Dependence on renal dialysis: Secondary | ICD-10-CM | POA: Diagnosis not present

## 2017-10-08 DIAGNOSIS — N186 End stage renal disease: Secondary | ICD-10-CM | POA: Diagnosis not present

## 2017-10-11 DIAGNOSIS — Z992 Dependence on renal dialysis: Secondary | ICD-10-CM | POA: Diagnosis not present

## 2017-10-11 DIAGNOSIS — N186 End stage renal disease: Secondary | ICD-10-CM | POA: Diagnosis not present

## 2017-10-12 DIAGNOSIS — Z992 Dependence on renal dialysis: Secondary | ICD-10-CM | POA: Diagnosis not present

## 2017-10-12 DIAGNOSIS — N186 End stage renal disease: Secondary | ICD-10-CM | POA: Diagnosis not present

## 2017-10-13 DIAGNOSIS — Z992 Dependence on renal dialysis: Secondary | ICD-10-CM | POA: Diagnosis not present

## 2017-10-13 DIAGNOSIS — N186 End stage renal disease: Secondary | ICD-10-CM | POA: Diagnosis not present

## 2017-10-14 ENCOUNTER — Ambulatory Visit (INDEPENDENT_AMBULATORY_CARE_PROVIDER_SITE_OTHER): Payer: Medicare HMO | Admitting: *Deleted

## 2017-10-14 DIAGNOSIS — Z5181 Encounter for therapeutic drug level monitoring: Secondary | ICD-10-CM | POA: Diagnosis not present

## 2017-10-14 DIAGNOSIS — I4891 Unspecified atrial fibrillation: Secondary | ICD-10-CM | POA: Diagnosis not present

## 2017-10-14 LAB — POCT INR: INR: 1.9 — AB (ref 2.0–3.0)

## 2017-10-14 NOTE — Patient Instructions (Signed)
Description   Today take 1.5 tablets, then Continue coumadin 1 tablet daily except 1/2 tablet on Sunday. Eat 3 servings each week of leafy green vegetable.  Recheck in 3 weeks

## 2017-10-15 DIAGNOSIS — N186 End stage renal disease: Secondary | ICD-10-CM | POA: Diagnosis not present

## 2017-10-15 DIAGNOSIS — Z992 Dependence on renal dialysis: Secondary | ICD-10-CM | POA: Diagnosis not present

## 2017-10-18 DIAGNOSIS — Z992 Dependence on renal dialysis: Secondary | ICD-10-CM | POA: Diagnosis not present

## 2017-10-18 DIAGNOSIS — N186 End stage renal disease: Secondary | ICD-10-CM | POA: Diagnosis not present

## 2017-10-20 DIAGNOSIS — Z992 Dependence on renal dialysis: Secondary | ICD-10-CM | POA: Diagnosis not present

## 2017-10-20 DIAGNOSIS — N186 End stage renal disease: Secondary | ICD-10-CM | POA: Diagnosis not present

## 2017-10-22 DIAGNOSIS — N186 End stage renal disease: Secondary | ICD-10-CM | POA: Diagnosis not present

## 2017-10-22 DIAGNOSIS — Z992 Dependence on renal dialysis: Secondary | ICD-10-CM | POA: Diagnosis not present

## 2017-10-23 DIAGNOSIS — Z992 Dependence on renal dialysis: Secondary | ICD-10-CM | POA: Diagnosis not present

## 2017-10-23 DIAGNOSIS — N186 End stage renal disease: Secondary | ICD-10-CM | POA: Diagnosis not present

## 2017-10-25 DIAGNOSIS — Z992 Dependence on renal dialysis: Secondary | ICD-10-CM | POA: Diagnosis not present

## 2017-10-25 DIAGNOSIS — N186 End stage renal disease: Secondary | ICD-10-CM | POA: Diagnosis not present

## 2017-10-27 DIAGNOSIS — N186 End stage renal disease: Secondary | ICD-10-CM | POA: Diagnosis not present

## 2017-10-27 DIAGNOSIS — Z992 Dependence on renal dialysis: Secondary | ICD-10-CM | POA: Diagnosis not present

## 2017-10-29 DIAGNOSIS — N186 End stage renal disease: Secondary | ICD-10-CM | POA: Diagnosis not present

## 2017-10-29 DIAGNOSIS — Z992 Dependence on renal dialysis: Secondary | ICD-10-CM | POA: Diagnosis not present

## 2017-11-01 DIAGNOSIS — Z992 Dependence on renal dialysis: Secondary | ICD-10-CM | POA: Diagnosis not present

## 2017-11-01 DIAGNOSIS — N186 End stage renal disease: Secondary | ICD-10-CM | POA: Diagnosis not present

## 2017-11-02 ENCOUNTER — Ambulatory Visit (INDEPENDENT_AMBULATORY_CARE_PROVIDER_SITE_OTHER): Payer: Medicare HMO | Admitting: *Deleted

## 2017-11-02 DIAGNOSIS — Z5181 Encounter for therapeutic drug level monitoring: Secondary | ICD-10-CM | POA: Diagnosis not present

## 2017-11-02 DIAGNOSIS — I4891 Unspecified atrial fibrillation: Secondary | ICD-10-CM | POA: Diagnosis not present

## 2017-11-02 LAB — POCT INR: INR: 1.9 — AB (ref 2.0–3.0)

## 2017-11-02 NOTE — Patient Instructions (Signed)
Increase coumadin to 1 tablet daily except 1 1/2 tablets on Wednesdays.  Eat 3 servings each week of leafy green vegetable.  Recheck in 3 weeks

## 2017-11-03 DIAGNOSIS — N186 End stage renal disease: Secondary | ICD-10-CM | POA: Diagnosis not present

## 2017-11-03 DIAGNOSIS — Z992 Dependence on renal dialysis: Secondary | ICD-10-CM | POA: Diagnosis not present

## 2017-11-05 DIAGNOSIS — Z992 Dependence on renal dialysis: Secondary | ICD-10-CM | POA: Diagnosis not present

## 2017-11-05 DIAGNOSIS — N186 End stage renal disease: Secondary | ICD-10-CM | POA: Diagnosis not present

## 2017-11-08 DIAGNOSIS — Z992 Dependence on renal dialysis: Secondary | ICD-10-CM | POA: Diagnosis not present

## 2017-11-08 DIAGNOSIS — N186 End stage renal disease: Secondary | ICD-10-CM | POA: Diagnosis not present

## 2017-11-10 DIAGNOSIS — N186 End stage renal disease: Secondary | ICD-10-CM | POA: Diagnosis not present

## 2017-11-10 DIAGNOSIS — Z992 Dependence on renal dialysis: Secondary | ICD-10-CM | POA: Diagnosis not present

## 2017-11-11 DIAGNOSIS — N186 End stage renal disease: Secondary | ICD-10-CM | POA: Diagnosis not present

## 2017-11-11 DIAGNOSIS — Z992 Dependence on renal dialysis: Secondary | ICD-10-CM | POA: Diagnosis not present

## 2017-11-12 DIAGNOSIS — N186 End stage renal disease: Secondary | ICD-10-CM | POA: Diagnosis not present

## 2017-11-12 DIAGNOSIS — Z992 Dependence on renal dialysis: Secondary | ICD-10-CM | POA: Diagnosis not present

## 2017-11-14 DIAGNOSIS — D3000 Benign neoplasm of unspecified kidney: Secondary | ICD-10-CM | POA: Diagnosis not present

## 2017-11-14 DIAGNOSIS — N2889 Other specified disorders of kidney and ureter: Secondary | ICD-10-CM | POA: Diagnosis not present

## 2017-11-15 DIAGNOSIS — N186 End stage renal disease: Secondary | ICD-10-CM | POA: Diagnosis not present

## 2017-11-15 DIAGNOSIS — Z992 Dependence on renal dialysis: Secondary | ICD-10-CM | POA: Diagnosis not present

## 2017-11-17 DIAGNOSIS — Z992 Dependence on renal dialysis: Secondary | ICD-10-CM | POA: Diagnosis not present

## 2017-11-17 DIAGNOSIS — N186 End stage renal disease: Secondary | ICD-10-CM | POA: Diagnosis not present

## 2017-11-18 ENCOUNTER — Ambulatory Visit: Payer: Medicare HMO | Admitting: Nurse Practitioner

## 2017-11-18 ENCOUNTER — Encounter: Payer: Self-pay | Admitting: Nurse Practitioner

## 2017-11-18 VITALS — BP 112/72 | HR 96 | Temp 98.9°F | Ht 74.0 in | Wt 215.0 lb

## 2017-11-18 DIAGNOSIS — K5900 Constipation, unspecified: Secondary | ICD-10-CM

## 2017-11-18 DIAGNOSIS — K2990 Gastroduodenitis, unspecified, without bleeding: Secondary | ICD-10-CM

## 2017-11-18 DIAGNOSIS — K2970 Gastritis, unspecified, without bleeding: Secondary | ICD-10-CM | POA: Diagnosis not present

## 2017-11-18 DIAGNOSIS — R1900 Intra-abdominal and pelvic swelling, mass and lump, unspecified site: Secondary | ICD-10-CM

## 2017-11-18 NOTE — Progress Notes (Signed)
Referring Provider: Redmond School, MD Primary Care Physician:  Redmond School, MD Primary GI:  Dr. [name]  Chief Complaint  Patient presents with  . pancreas cyst    f/u, had abdominal CT done at [facility] 11/14/17    HPI:   Albert [name] is a 62 y.o. male who presents for follow-up on constipation and abdominal swelling. The patient was last seen in our office on 05/13/2017 for the same.  Noted history of renal cell carcinoma.  Recent MRI with no dominant pancreatic mass seen but other findings suspicious for non-complicated pancreatitis.  Most recent EGD 01/05/2017 which found diffuse submucosal gastric petechiae status post biopsy, multiple abnormalities involving the duodenum concerning for underlying malignancy.  Upper GI bleeding likely due to hemobilia or hemostatic his pancreatic US.  Surgical pathology found to be fundic gland polyp and foveolar gland hyperplasia, no dysplasia or malignancy.  Follow-up EUS with multiple gallstones and possible very mild gallbladder inflammation.  HIDA scan follow-up found ejection fraction of 0% and he was referred to surgery did not feel he needed surgery at that time.  Follow-up CT found new pancreatic tail 5.1 x 4.1 x 3.9 cm fluid appearing collection with adjacent free fluid that extends along the anterior superior margin of Gerota's fascia.  Loculated fluid collection along the inferior aspect of the liver slightly larger now measuring 7.1 x 3.5 x 7.8 cm.  Follow-up EUS after CT imaging dated 04/21/2017 which found no findings consistent with EGD completed 01/11/2017 felt to be acute issue that is since resolved.  Noted mixed solid/cystic fluid collection in the tail the pancreas was completely drained and sent to cytology which found no malignant cells.  CEA of 22 which was low.  Patient was told no signs of cancer most suspicious it was likely a pseudocyst that will resolve with time.  At his last visit he was doing well overall.   Constipation better, still on fiber and no other treatments.  Denies abdominal pain.  Has lost 3 to 5 pounds in the past couple months with intermittent appetite.  Still does not feel like his abdomen is swollen.  No other GI symptoms.  Follows with urology and may need nephrectomy in the future.  MRI of the kidney updated and found in enlarging, now 5 cm lesion concerning for solid neoplasm along the anterior margin of the left kidney.  At the time of his last visit we recommended he continue his current medications, follow-up with other specialist, follow-up in 6 months.  Today he states he's doing ok . Had recent CT for nephrology, sees them next week related to renal mass. No further constipation on OTC medications. GERD doing well on PPI. Occasional nausea which he attributes to some of his medications. Denies abdominal pain, vomiting, abdominal swelling, hematochezia, melena, fever, chills, unintentional weight loss. Denies chest pain, dyspnea, dizziness, lightheadedness, syncope, near syncope. Denies any other upper or lower GI symptoms.  He notes a "lump" in his LLQ when he lays on this side. Will request recent CT for any suggestion of abnormality.  Past Medical History:  Diagnosis Date  . Anemia   . Atrial fibrillation ([place])   . Cardiomyopathy    LVEF 50-55% 8/11 - [facility]  . Colonic polyp   . Diverticulosis of colon   . ESRD on hemodialysis ([place])    Dr. Lowanda Foster [facility]  . Essential hypertension, benign   . Gout   . Mixed hyperlipidemia   . OSA (obstructive sleep apnea)  CPAP  . Renal cell cancer ([place])   . Type 2 diabetes mellitus ([place])    Type II    Past Surgical History:  Procedure Laterality Date  . BIOPSY  01/05/2017   Procedure: BIOPSY;  Surgeon: Daneil Dolin, MD;  Location: [facility];  Service: Endoscopy;;  gastric  . COLONOSCOPY N/A 01/07/2016   Dr. [name]: Grade 1 internal hemorrhoids, otherwise normal  . COLONOSCOPY W/ POLYPECTOMY  2009, 2012   2009: Dr.  [name]: 1.25 cm adenomatous polyp without high grade dysplasia, distal to IC valve. 2012: normal rectum, few pancolonic diverticula, single diminutive hyperplastic polyp  . ESOPHAGOGASTRODUODENOSCOPY  2009   Dr. [name]: accentuated undulating Z-lin with couple of islands of salmon-colored epithelium suspicious for Barrett's, s/p biopsy with path revealing mild inflammation, no metaplasia, dysplasia, or malignancy  . ESOPHAGOGASTRODUODENOSCOPY N/A 01/05/2017   Procedure: ESOPHAGOGASTRODUODENOSCOPY (EGD);  Surgeon: Daneil Dolin, MD;  Location: [facility];  Service: Endoscopy;  Laterality: N/A;  . EUS N/A 04/21/2017   Procedure: UPPER ENDOSCOPIC ULTRASOUND (EUS) LINEAR;  Surgeon: Milus Banister, MD;  Location: [facility];  Service: Endoscopy;  Laterality: N/A;  . FISTULA SUPERFICIALIZATION Left 03/31/2015   Procedure: CEPHALIC VEIN TURNDOWN; PLICATION OF BRACHIOCEPHALIC AVF;  Surgeon: Conrad Lochsloy, MD;  Location: [place];  Service: Vascular;  Laterality: Left;  . FISTULOGRAM N/A 10/19/2013   Procedure: FISTULOGRAM;  Surgeon: Rosetta Posner, MD;  [facility] CATH LAB;  Service: Cardiovascular;  Laterality: N/A;  . Left arm AV fistula  2009   Dr. [name]  . PERIPHERAL VASCULAR CATHETERIZATION N/A 03/10/2015   Procedure: Fistulagram;  Surgeon: Conrad Loco, MD;  Location: [place] CV LAB;  Service: Cardiovascular;  Laterality: N/A;  . Right nephrectomy      Current Outpatient Medications  Medication Sig Dispense Refill  . allopurinol (ZYLOPRIM) 100 MG tablet Take 100 mg by mouth daily.     . blood glucose meter kit and supplies Dispense based on patient and insurance preference. Use up to four times daily as directed. (FOR ICD-9 250.00, 250.01). 1 each 0  . carvedilol (COREG) 25 MG tablet TAKE 1 TABLET TWICE DAILY WITH MEALS (Patient taking differently: TAKE 1/2 TABLET TWICE DAILY WITH MEALS) 180 tablet 3  . cinacalcet (SENSIPAR) 30 MG tablet Take 30 mg by mouth at bedtime.     Marland Kitchen diltiazem  (CARDIZEM CD) 180 MG 24 hr capsule Take 1 capsule (180 mg total) by mouth daily. 90 capsule 3  . docusate sodium (COLACE) 100 MG capsule Take 100 mg by mouth daily.    . fenofibrate 160 MG tablet Take 160 mg every evening by mouth.     . fish oil-omega-3 fatty acids 1000 MG capsule Take 1 g by mouth 3 (three) times daily.     Marland Kitchen glipiZIDE (GLUCOTROL XL) 5 MG 24 hr tablet Take 5 mg by mouth daily.     . Insulin Pen Needle 31G X 5 MM MISC Use as directed 30 each 0  . pantoprazole (PROTONIX) 40 MG tablet Take 1 tablet (40 mg total) by mouth daily. 30 tablet 1  . pravastatin (PRAVACHOL) 10 MG tablet Take 5 mg by mouth every evening.     . sevelamer carbonate (RENVELA) 800 MG tablet Take 1,600-3,200 mg by mouth See admin instructions. Take 3200 mg after each meal and 1600 mg each snack    . Tetrahydrozoline HCl (VISINE OP) Place 2 drops into both eyes daily as needed (dry eyes).    Lady Gary  5 MG TABS tablet daily.    Marland Kitchen warfarin (COUMADIN) 2.5 MG tablet TAKE 1 TABLET EVERY DAY EXCEPT TAKE  1/2 TABLET ON SUNDAYS, TUESDAYS AND THURSDAYS OR AS DIRECTED (Patient taking differently: Takes 1 tablet daily except Wednesdays; Wednesdays takes 1.5 tablet) 135 tablet 3  . Wheat Dextrin (BENEFIBER DRINK MIX PO) Take 1 Dose by mouth daily. 1 tbsp      No current facility-administered medications for this visit.     Allergies as of 11/18/2017 - Review Complete 11/18/2017  Allergen Reaction Noted  . Metformin and related Other (See Comments) 01/18/2017    Family History  Problem Relation Age of Onset  . Hypertension Mother   . Hypertension Unknown        Siblings  . Diabetes type II Unknown        Siblings  . Colon cancer Neg Hx     Social History   Socioeconomic History  . Marital status: Single    Spouse name: Not on file  . Number of children: Not on file  . Years of education: Not on file  . Highest education level: Not on file  Occupational History  . Not on file  Social Needs  .  Financial resource strain: Not on file  . Food insecurity:    Worry: Not on file    Inability: Not on file  . Transportation needs:    Medical: Not on file    Non-medical: Not on file  Tobacco Use  . Smoking status: Former Smoker    Types: Cigarettes    Last attempt to quit: 07/04/1998    Years since quitting: 19.3  . Smokeless tobacco: Never Used  . Tobacco comment: Quit May 2000  Substance and Sexual Activity  . Alcohol use: No    Alcohol/week: 0.0 standard drinks    Comment: Quit 2014; Previously drank about 3 drinks a day  . Drug use: No  . Sexual activity: Not on file  Lifestyle  . Physical activity:    Days per week: Not on file    Minutes per session: Not on file  . Stress: Not on file  Relationships  . Social connections:    Talks on phone: Not on file    Gets together: Not on file    Attends religious service: Not on file    Active member of club or organization: Not on file    Attends meetings of clubs or organizations: Not on file    Relationship status: Not on file  Other Topics Concern  . Not on file  Social History Narrative  . Not on file    Review of Systems: General: Negative for anorexia, weight loss, fever, chills, fatigue, weakness. Eyes: Negative for vision changes.  ENT: Negative for hoarseness, difficulty swallowing , nasal congestion. CV: Negative for chest pain, angina, palpitations, dyspnea on exertion, peripheral edema.  Respiratory: Negative for dyspnea at rest, dyspnea on exertion, cough, sputum, wheezing.  GI: See history of present illness. GU:  Negative for dysuria, hematuria, urinary incontinence, urinary frequency, nocturnal urination.  MS: Negative for joint pain, low back pain.  Derm: Negative for rash or itching.  Neuro: Negative for weakness, abnormal sensation, seizure, frequent headaches, memory loss, confusion.  Psych: Negative for anxiety, depression, suicidal ideation, hallucinations.  Endo: Negative for unusual weight  change.  Heme: Negative for bruising or bleeding. Allergy: Negative for rash or hives.   Physical Exam: BP 112/72   Pulse 96   Temp 98.9 F (37.2 C) (Oral)  Ht '6\' 2"'  (1.88 m)   Wt 215 lb (97.5 kg)   BMI 27.60 kg/m  General:   Alert and oriented. Pleasant and cooperative. Well-nourished and well-developed.  Head:  Normocephalic and atraumatic. Eyes:  Without icterus, sclera clear and conjunctiva pink.  Ears:  Normal auditory acuity. Mouth:  No deformity or lesions, oral mucosa pink.  Throat/Neck:  Supple, without mass or thyromegaly. Cardiovascular:  S1, S2 present without murmurs appreciated. Normal pulses noted. Extremities without clubbing or edema. Respiratory:  Clear to auscultation bilaterally. No wheezes, rales, or rhonchi. No distress.  Gastrointestinal:  +BS, soft, non-tender and non-distended. No HSM noted. No guarding or rebound. No masses appreciated.  Rectal:  Deferred  Musculoskalatal:  Symmetrical without gross deformities. Normal posture. Skin:  Intact without significant lesions or rashes. Neurologic:  Alert and oriented x4;  grossly normal neurologically. Psych:  Alert and cooperative. Normal mood and affect. Heme/Lymph/Immune: No significant cervical adenopathy. No excessive bruising noted.    11/18/2017 10:53 AM   Disclaimer: This note was dictated with voice recognition software. Similar sounding words can inadvertently be transcribed and may not be corrected upon review.

## 2017-11-18 NOTE — Patient Instructions (Signed)
1. Continue your current medications. 2. Return for follow-up in 1 year. 3. Call us if you have any questions, concerns, or worsening symptoms.  At [facility] we value your feedback. You may receive a survey about your visit today. Please share your experience as we strive to create trusting relationships with our patients to provide genuine, compassionate, quality care.  We appreciate your understanding and patience as we review any laboratory studies, imaging, and other diagnostic tests that are ordered as we care for you. Our office policy is 5 business days for review of these results, and any emergent or urgent results are addressed in a timely manner for your best interest. If you do not hear from our office in 1 week, please contact us.   We also encourage the use of MyChart, which contains your medical information for your review as well. If you are not enrolled in this feature, an access code is on this after visit summary for your convenience. Thank you for allowing [place] to be involved in your care.  It was great to see you today!  I hope you have a great Fall!!

## 2017-11-19 DIAGNOSIS — N186 End stage renal disease: Secondary | ICD-10-CM | POA: Diagnosis not present

## 2017-11-19 DIAGNOSIS — Z992 Dependence on renal dialysis: Secondary | ICD-10-CM | POA: Diagnosis not present

## 2017-11-21 DIAGNOSIS — Z992 Dependence on renal dialysis: Secondary | ICD-10-CM | POA: Diagnosis not present

## 2017-11-21 DIAGNOSIS — N186 End stage renal disease: Secondary | ICD-10-CM | POA: Diagnosis not present

## 2017-11-21 NOTE — Assessment & Plan Note (Signed)
The patient notes some abdominal swelling.  Recent CT by nephrology related to renal mass.  He does describe a "lump" in his left lower quadrant when he lays on his side.  We will request a CT results from nephrology to evaluate for any further abnormality.  He does have a history of pancreatic issues including a fluid collection in the pancreatic tail with follow-up EUS and imaging felt to likely be resolved after EUS guided drainage and found negative for malignant cells with a normal CEA level.  Deemed most likely a pseudocyst that will resolve with time.  Recommend he continue his current medications, follow-up in 1 year.  Call with any concerns.

## 2017-11-21 NOTE — Assessment & Plan Note (Signed)
Constipation is doing well on an over-the-counter regimen.  No significant breakthrough constipation symptoms, rectal bleeding.  Recommend he continue his current regimen, follow-up in 1 year and call us if any worsening or recurrent symptoms.

## 2017-11-21 NOTE — Progress Notes (Signed)
CC'D TO PCP °

## 2017-11-21 NOTE — Assessment & Plan Note (Signed)
GERD symptoms are doing well on PPI.  Occasional nausea which he attributes to his other medications.  At this time no change in management needed.  Recommend he continue his current medications and follow-up in 1 year.

## 2017-11-22 DIAGNOSIS — N186 End stage renal disease: Secondary | ICD-10-CM | POA: Diagnosis not present

## 2017-11-22 DIAGNOSIS — Z23 Encounter for immunization: Secondary | ICD-10-CM | POA: Diagnosis not present

## 2017-11-22 DIAGNOSIS — Z992 Dependence on renal dialysis: Secondary | ICD-10-CM | POA: Diagnosis not present

## 2017-11-23 ENCOUNTER — Ambulatory Visit (INDEPENDENT_AMBULATORY_CARE_PROVIDER_SITE_OTHER): Payer: Medicare HMO | Admitting: *Deleted

## 2017-11-23 DIAGNOSIS — I4891 Unspecified atrial fibrillation: Secondary | ICD-10-CM | POA: Diagnosis not present

## 2017-11-23 DIAGNOSIS — Z5181 Encounter for therapeutic drug level monitoring: Secondary | ICD-10-CM

## 2017-11-23 LAB — POCT INR: INR: 2.9 (ref 2.0–3.0)

## 2017-11-23 NOTE — Patient Instructions (Signed)
Continue coumadin 1 tablet daily except 1 1/2 tablets on Wednesdays.  Eat 3 servings each week of leafy green vegetable.  Recheck in 4 weeks

## 2017-11-24 DIAGNOSIS — Z23 Encounter for immunization: Secondary | ICD-10-CM | POA: Diagnosis not present

## 2017-11-24 DIAGNOSIS — N186 End stage renal disease: Secondary | ICD-10-CM | POA: Diagnosis not present

## 2017-11-24 DIAGNOSIS — Z992 Dependence on renal dialysis: Secondary | ICD-10-CM | POA: Diagnosis not present

## 2017-11-26 DIAGNOSIS — Z23 Encounter for immunization: Secondary | ICD-10-CM | POA: Diagnosis not present

## 2017-11-26 DIAGNOSIS — N186 End stage renal disease: Secondary | ICD-10-CM | POA: Diagnosis not present

## 2017-11-26 DIAGNOSIS — Z992 Dependence on renal dialysis: Secondary | ICD-10-CM | POA: Diagnosis not present

## 2017-11-28 DIAGNOSIS — C642 Malignant neoplasm of left kidney, except renal pelvis: Secondary | ICD-10-CM | POA: Diagnosis not present

## 2017-11-29 DIAGNOSIS — Z992 Dependence on renal dialysis: Secondary | ICD-10-CM | POA: Diagnosis not present

## 2017-11-29 DIAGNOSIS — Z23 Encounter for immunization: Secondary | ICD-10-CM | POA: Diagnosis not present

## 2017-11-29 DIAGNOSIS — N186 End stage renal disease: Secondary | ICD-10-CM | POA: Diagnosis not present

## 2017-12-01 DIAGNOSIS — Z992 Dependence on renal dialysis: Secondary | ICD-10-CM | POA: Diagnosis not present

## 2017-12-01 DIAGNOSIS — Z23 Encounter for immunization: Secondary | ICD-10-CM | POA: Diagnosis not present

## 2017-12-01 DIAGNOSIS — N186 End stage renal disease: Secondary | ICD-10-CM | POA: Diagnosis not present

## 2017-12-03 DIAGNOSIS — Z992 Dependence on renal dialysis: Secondary | ICD-10-CM | POA: Diagnosis not present

## 2017-12-03 DIAGNOSIS — N186 End stage renal disease: Secondary | ICD-10-CM | POA: Diagnosis not present

## 2017-12-03 DIAGNOSIS — Z23 Encounter for immunization: Secondary | ICD-10-CM | POA: Diagnosis not present

## 2017-12-06 DIAGNOSIS — E119 Type 2 diabetes mellitus without complications: Secondary | ICD-10-CM | POA: Diagnosis not present

## 2017-12-06 DIAGNOSIS — N186 End stage renal disease: Secondary | ICD-10-CM | POA: Diagnosis not present

## 2017-12-06 DIAGNOSIS — Z23 Encounter for immunization: Secondary | ICD-10-CM | POA: Diagnosis not present

## 2017-12-06 DIAGNOSIS — Z992 Dependence on renal dialysis: Secondary | ICD-10-CM | POA: Diagnosis not present

## 2017-12-08 DIAGNOSIS — Z23 Encounter for immunization: Secondary | ICD-10-CM | POA: Diagnosis not present

## 2017-12-08 DIAGNOSIS — N186 End stage renal disease: Secondary | ICD-10-CM | POA: Diagnosis not present

## 2017-12-08 DIAGNOSIS — Z992 Dependence on renal dialysis: Secondary | ICD-10-CM | POA: Diagnosis not present

## 2017-12-09 DIAGNOSIS — N186 End stage renal disease: Secondary | ICD-10-CM | POA: Diagnosis not present

## 2017-12-09 DIAGNOSIS — Z23 Encounter for immunization: Secondary | ICD-10-CM | POA: Diagnosis not present

## 2017-12-09 DIAGNOSIS — Z992 Dependence on renal dialysis: Secondary | ICD-10-CM | POA: Diagnosis not present

## 2017-12-11 DIAGNOSIS — Z23 Encounter for immunization: Secondary | ICD-10-CM | POA: Diagnosis not present

## 2017-12-11 DIAGNOSIS — Z992 Dependence on renal dialysis: Secondary | ICD-10-CM | POA: Diagnosis not present

## 2017-12-11 DIAGNOSIS — N186 End stage renal disease: Secondary | ICD-10-CM | POA: Diagnosis not present

## 2017-12-13 DIAGNOSIS — N186 End stage renal disease: Secondary | ICD-10-CM | POA: Diagnosis not present

## 2017-12-13 DIAGNOSIS — Z23 Encounter for immunization: Secondary | ICD-10-CM | POA: Diagnosis not present

## 2017-12-13 DIAGNOSIS — Z992 Dependence on renal dialysis: Secondary | ICD-10-CM | POA: Diagnosis not present

## 2017-12-15 DIAGNOSIS — Z23 Encounter for immunization: Secondary | ICD-10-CM | POA: Diagnosis not present

## 2017-12-15 DIAGNOSIS — N186 End stage renal disease: Secondary | ICD-10-CM | POA: Diagnosis not present

## 2017-12-15 DIAGNOSIS — Z992 Dependence on renal dialysis: Secondary | ICD-10-CM | POA: Diagnosis not present

## 2017-12-17 DIAGNOSIS — Z23 Encounter for immunization: Secondary | ICD-10-CM | POA: Diagnosis not present

## 2017-12-17 DIAGNOSIS — Z992 Dependence on renal dialysis: Secondary | ICD-10-CM | POA: Diagnosis not present

## 2017-12-17 DIAGNOSIS — N186 End stage renal disease: Secondary | ICD-10-CM | POA: Diagnosis not present

## 2017-12-20 DIAGNOSIS — Z992 Dependence on renal dialysis: Secondary | ICD-10-CM | POA: Diagnosis not present

## 2017-12-20 DIAGNOSIS — Z23 Encounter for immunization: Secondary | ICD-10-CM | POA: Diagnosis not present

## 2017-12-20 DIAGNOSIS — N186 End stage renal disease: Secondary | ICD-10-CM | POA: Diagnosis not present

## 2017-12-22 DIAGNOSIS — Z23 Encounter for immunization: Secondary | ICD-10-CM | POA: Diagnosis not present

## 2017-12-22 DIAGNOSIS — Z992 Dependence on renal dialysis: Secondary | ICD-10-CM | POA: Diagnosis not present

## 2017-12-22 DIAGNOSIS — N186 End stage renal disease: Secondary | ICD-10-CM | POA: Diagnosis not present

## 2017-12-23 DIAGNOSIS — Z992 Dependence on renal dialysis: Secondary | ICD-10-CM | POA: Diagnosis not present

## 2017-12-23 DIAGNOSIS — N186 End stage renal disease: Secondary | ICD-10-CM | POA: Diagnosis not present

## 2017-12-24 DIAGNOSIS — Z992 Dependence on renal dialysis: Secondary | ICD-10-CM | POA: Diagnosis not present

## 2017-12-24 DIAGNOSIS — N186 End stage renal disease: Secondary | ICD-10-CM | POA: Diagnosis not present

## 2017-12-26 ENCOUNTER — Ambulatory Visit (INDEPENDENT_AMBULATORY_CARE_PROVIDER_SITE_OTHER): Payer: Medicare HMO | Admitting: *Deleted

## 2017-12-26 DIAGNOSIS — Z5181 Encounter for therapeutic drug level monitoring: Secondary | ICD-10-CM | POA: Diagnosis not present

## 2017-12-26 DIAGNOSIS — I4891 Unspecified atrial fibrillation: Secondary | ICD-10-CM

## 2017-12-26 LAB — POCT INR: INR: 3.3 — AB (ref 2.0–3.0)

## 2017-12-26 NOTE — Patient Instructions (Signed)
Take coumadin 1/2 tablet tonight then decrease dose to 1 tablet daily Eat 3 servings each week of leafy green vegetable.  Recheck in 4 weeks

## 2017-12-27 DIAGNOSIS — Z992 Dependence on renal dialysis: Secondary | ICD-10-CM | POA: Diagnosis not present

## 2017-12-27 DIAGNOSIS — N186 End stage renal disease: Secondary | ICD-10-CM | POA: Diagnosis not present

## 2017-12-29 DIAGNOSIS — N186 End stage renal disease: Secondary | ICD-10-CM | POA: Diagnosis not present

## 2017-12-29 DIAGNOSIS — Z992 Dependence on renal dialysis: Secondary | ICD-10-CM | POA: Diagnosis not present

## 2017-12-31 DIAGNOSIS — Z992 Dependence on renal dialysis: Secondary | ICD-10-CM | POA: Diagnosis not present

## 2017-12-31 DIAGNOSIS — N186 End stage renal disease: Secondary | ICD-10-CM | POA: Diagnosis not present

## 2018-01-03 DIAGNOSIS — Z992 Dependence on renal dialysis: Secondary | ICD-10-CM | POA: Diagnosis not present

## 2018-01-03 DIAGNOSIS — N186 End stage renal disease: Secondary | ICD-10-CM | POA: Diagnosis not present

## 2018-01-05 DIAGNOSIS — N186 End stage renal disease: Secondary | ICD-10-CM | POA: Diagnosis not present

## 2018-01-05 DIAGNOSIS — Z992 Dependence on renal dialysis: Secondary | ICD-10-CM | POA: Diagnosis not present

## 2018-01-06 DIAGNOSIS — E663 Overweight: Secondary | ICD-10-CM | POA: Diagnosis not present

## 2018-01-06 DIAGNOSIS — S91205A Unspecified open wound of left lesser toe(s) with damage to nail, initial encounter: Secondary | ICD-10-CM | POA: Diagnosis not present

## 2018-01-06 DIAGNOSIS — E1140 Type 2 diabetes mellitus with diabetic neuropathy, unspecified: Secondary | ICD-10-CM | POA: Diagnosis not present

## 2018-01-06 DIAGNOSIS — Z6828 Body mass index (BMI) 28.0-28.9, adult: Secondary | ICD-10-CM | POA: Diagnosis not present

## 2018-01-07 DIAGNOSIS — N186 End stage renal disease: Secondary | ICD-10-CM | POA: Diagnosis not present

## 2018-01-07 DIAGNOSIS — Z992 Dependence on renal dialysis: Secondary | ICD-10-CM | POA: Diagnosis not present

## 2018-01-09 ENCOUNTER — Ambulatory Visit (INDEPENDENT_AMBULATORY_CARE_PROVIDER_SITE_OTHER): Payer: Medicare HMO | Admitting: Otolaryngology

## 2018-01-09 DIAGNOSIS — H903 Sensorineural hearing loss, bilateral: Secondary | ICD-10-CM

## 2018-01-10 DIAGNOSIS — Z992 Dependence on renal dialysis: Secondary | ICD-10-CM | POA: Diagnosis not present

## 2018-01-10 DIAGNOSIS — N186 End stage renal disease: Secondary | ICD-10-CM | POA: Diagnosis not present

## 2018-01-12 DIAGNOSIS — Z992 Dependence on renal dialysis: Secondary | ICD-10-CM | POA: Diagnosis not present

## 2018-01-12 DIAGNOSIS — N186 End stage renal disease: Secondary | ICD-10-CM | POA: Diagnosis not present

## 2018-01-14 DIAGNOSIS — Z992 Dependence on renal dialysis: Secondary | ICD-10-CM | POA: Diagnosis not present

## 2018-01-14 DIAGNOSIS — N186 End stage renal disease: Secondary | ICD-10-CM | POA: Diagnosis not present

## 2018-01-17 DIAGNOSIS — Z992 Dependence on renal dialysis: Secondary | ICD-10-CM | POA: Diagnosis not present

## 2018-01-17 DIAGNOSIS — N186 End stage renal disease: Secondary | ICD-10-CM | POA: Diagnosis not present

## 2018-01-19 DIAGNOSIS — Z992 Dependence on renal dialysis: Secondary | ICD-10-CM | POA: Diagnosis not present

## 2018-01-19 DIAGNOSIS — N186 End stage renal disease: Secondary | ICD-10-CM | POA: Diagnosis not present

## 2018-01-21 DIAGNOSIS — Z992 Dependence on renal dialysis: Secondary | ICD-10-CM | POA: Diagnosis not present

## 2018-01-21 DIAGNOSIS — N186 End stage renal disease: Secondary | ICD-10-CM | POA: Diagnosis not present

## 2018-01-22 DIAGNOSIS — Z992 Dependence on renal dialysis: Secondary | ICD-10-CM | POA: Diagnosis not present

## 2018-01-22 DIAGNOSIS — N186 End stage renal disease: Secondary | ICD-10-CM | POA: Diagnosis not present

## 2018-01-23 ENCOUNTER — Ambulatory Visit (INDEPENDENT_AMBULATORY_CARE_PROVIDER_SITE_OTHER): Payer: Medicare HMO | Admitting: *Deleted

## 2018-01-23 DIAGNOSIS — I4891 Unspecified atrial fibrillation: Secondary | ICD-10-CM | POA: Diagnosis not present

## 2018-01-23 DIAGNOSIS — Z5181 Encounter for therapeutic drug level monitoring: Secondary | ICD-10-CM

## 2018-01-23 LAB — POCT INR: INR: 2.2 (ref 2.0–3.0)

## 2018-01-23 NOTE — Addendum Note (Signed)
Addended by: Malen Gauze on: 01/23/2018 10:53 AM   Modules accepted: Level of Service

## 2018-01-23 NOTE — Patient Instructions (Signed)
Continue coumadin 1 tablet daily  Eat 3 servings each week of leafy green vegetable.  Recheck in 4 weeks

## 2018-01-24 DIAGNOSIS — N186 End stage renal disease: Secondary | ICD-10-CM | POA: Diagnosis not present

## 2018-01-24 DIAGNOSIS — Z992 Dependence on renal dialysis: Secondary | ICD-10-CM | POA: Diagnosis not present

## 2018-01-26 DIAGNOSIS — Z992 Dependence on renal dialysis: Secondary | ICD-10-CM | POA: Diagnosis not present

## 2018-01-26 DIAGNOSIS — N186 End stage renal disease: Secondary | ICD-10-CM | POA: Diagnosis not present

## 2018-01-27 DIAGNOSIS — E663 Overweight: Secondary | ICD-10-CM | POA: Diagnosis not present

## 2018-01-27 DIAGNOSIS — E782 Mixed hyperlipidemia: Secondary | ICD-10-CM | POA: Diagnosis not present

## 2018-01-27 DIAGNOSIS — E1129 Type 2 diabetes mellitus with other diabetic kidney complication: Secondary | ICD-10-CM | POA: Diagnosis not present

## 2018-01-27 DIAGNOSIS — I10 Essential (primary) hypertension: Secondary | ICD-10-CM | POA: Diagnosis not present

## 2018-01-27 DIAGNOSIS — Z6827 Body mass index (BMI) 27.0-27.9, adult: Secondary | ICD-10-CM | POA: Diagnosis not present

## 2018-01-28 DIAGNOSIS — N186 End stage renal disease: Secondary | ICD-10-CM | POA: Diagnosis not present

## 2018-01-28 DIAGNOSIS — Z992 Dependence on renal dialysis: Secondary | ICD-10-CM | POA: Diagnosis not present

## 2018-01-30 ENCOUNTER — Ambulatory Visit (HOSPITAL_COMMUNITY): Payer: Medicare HMO | Admitting: Physical Therapy

## 2018-01-30 ENCOUNTER — Encounter (HOSPITAL_COMMUNITY): Payer: Self-pay

## 2018-01-31 ENCOUNTER — Encounter (HOSPITAL_COMMUNITY): Payer: Self-pay | Admitting: *Deleted

## 2018-01-31 ENCOUNTER — Other Ambulatory Visit: Payer: Self-pay

## 2018-01-31 ENCOUNTER — Emergency Department (HOSPITAL_COMMUNITY)
Admission: EM | Admit: 2018-01-31 | Discharge: 2018-01-31 | Disposition: A | Discharge status: Home / Self Care | Payer: Medicare HMO | Attending: Emergency Medicine | Admitting: Emergency Medicine

## 2018-01-31 DIAGNOSIS — N186 End stage renal disease: Secondary | ICD-10-CM | POA: Diagnosis not present

## 2018-01-31 DIAGNOSIS — Z79899 Other long term (current) drug therapy: Secondary | ICD-10-CM | POA: Insufficient documentation

## 2018-01-31 DIAGNOSIS — E119 Type 2 diabetes mellitus without complications: Secondary | ICD-10-CM | POA: Insufficient documentation

## 2018-01-31 DIAGNOSIS — Z7984 Long term (current) use of oral hypoglycemic drugs: Secondary | ICD-10-CM | POA: Insufficient documentation

## 2018-01-31 DIAGNOSIS — M5442 Lumbago with sciatica, left side: Secondary | ICD-10-CM | POA: Diagnosis not present

## 2018-01-31 DIAGNOSIS — I120 Hypertensive chronic kidney disease with stage 5 chronic kidney disease or end stage renal disease: Secondary | ICD-10-CM | POA: Insufficient documentation

## 2018-01-31 DIAGNOSIS — M545 Low back pain: Secondary | ICD-10-CM | POA: Diagnosis present

## 2018-01-31 DIAGNOSIS — M5432 Sciatica, left side: Secondary | ICD-10-CM

## 2018-01-31 DIAGNOSIS — Z992 Dependence on renal dialysis: Secondary | ICD-10-CM | POA: Insufficient documentation

## 2018-01-31 DIAGNOSIS — Z87891 Personal history of nicotine dependence: Secondary | ICD-10-CM | POA: Insufficient documentation

## 2018-01-31 HISTORY — DX: Polyneuropathy, unspecified: G62.9

## 2018-01-31 MED ORDER — HYDROCODONE-ACETAMINOPHEN 5-325 MG PO TABS: 1.0000 | ORAL_TABLET | Freq: Four times a day (QID) | ORAL | 0 refills | Status: DC | PRN

## 2018-01-31 MED ORDER — PREDNISONE 20 MG PO TABS: 40.0000 mg | ORAL_TABLET | Freq: Every day | ORAL | 0 refills | Status: DC

## 2018-01-31 NOTE — ED Triage Notes (Signed)
Pt c/o left hip pain that shoots down his leg and up into his back; pt denies any obvious injury; states he has had the pain x 1 day

## 2018-01-31 NOTE — ED Provider Notes (Signed)
[facility] EMERGENCY DEPARTMENT Provider Note   CSN: [id number] Arrival date & time: 01/31/18  5003     History   Chief Complaint Chief Complaint  Patient presents with  . Hip Pain    HPI Albert [name] is a 62 y.o. male.  HPI Patient presents with left hip and low back pain.  Began 2 days ago and worse yesterday.  It is dull.  Worse with movements.  Worse when he stands on it.  No fall.  States he has had a bulging disc year previously.  Pain goes from the left lower back down to the left leg.  No weakness or numbness.  No fevers.  He is on Coumadin.  Does have a cancer history.  Does not make urine and is on dialysis.  No fecal incontinence. Past Medical History:  Diagnosis Date  . Anemia   . Atrial fibrillation ([place])   . Cardiomyopathy    LVEF 50-55% 8/11 - [facility]  . Colonic polyp   . Diverticulosis of colon   . ESRD on hemodialysis ([place])    Dr. Lowanda Foster [facility]  . Essential hypertension, benign   . Gout   . Mixed hyperlipidemia   . Neuropathy    in both feet  . OSA (obstructive sleep apnea)    CPAP  . Renal cell cancer ([place])   . Type 2 diabetes mellitus ([place])    Type II    Patient Active Problem List   Diagnosis Date Noted  . Pancreas cyst 05/13/2017  . Abnormal CT scan, stomach   . Cholelithiasis 04/05/2017  . Biliary dyskinesia 04/05/2017  . Abdominal pain 03/11/2017  . Abdominal swelling 03/11/2017  . Hyperglycemia 01/21/2017  . Gastritis and gastroduodenitis   . Abnormal findings on esophagogastroduodenoscopy (EGD)   . GI bleed 01/05/2017  . Heme positive stool   . Gastrointestinal hemorrhage   . Supratherapeutic INR   . Coagulopathy ([place]) 01/03/2017  . Anemia 01/03/2017  . Constipation 12/17/2015  . Rectal bleeding 12/17/2015  . History of colonic polyps 12/17/2015  . Visit for suture removal 12/06/2013  . Bradycardia 10/12/2013  . Metabolic acidosis 70/48/8891  . Acute respiratory failure with hypercapnia ([place]) 10/12/2013  . Encounter  for therapeutic drug monitoring 03/29/2013  . Atrial fibrillation ([place]) 03/10/2011  . Long term (current) use of anticoagulants 03/10/2011  . Secondary cardiomyopathy, unspecified 03/10/2011  . ESRD (end stage renal disease) ([place]) 03/10/2011  . HYPERLIPIDEMIA 08/24/2007  . PRIMARY CENTRAL SLEEP APNEA 08/24/2007  . OBSTRUCTIVE SLEEP APNEA 08/24/2007  . Essential hypertension, benign 08/24/2007    Past Surgical History:  Procedure Laterality Date  . BIOPSY  01/05/2017   Procedure: BIOPSY;  Surgeon: Daneil Dolin, MD;  [facility];  Service: Endoscopy;;  gastric  . COLONOSCOPY N/A 01/07/2016   Dr. [name]: Grade 1 internal hemorrhoids, otherwise normal  . COLONOSCOPY W/ POLYPECTOMY  2009, 2012   2009: Dr. [name]: 1.25 cm adenomatous polyp without high grade dysplasia, distal to IC valve. 2012: normal rectum, few pancolonic diverticula, single diminutive hyperplastic polyp  . ESOPHAGOGASTRODUODENOSCOPY  2009   Dr. [name]: accentuated undulating Z-lin with couple of islands of salmon-colored epithelium suspicious for Barrett's, s/p biopsy with path revealing mild inflammation, no metaplasia, dysplasia, or malignancy  . ESOPHAGOGASTRODUODENOSCOPY N/A 01/05/2017   Procedure: ESOPHAGOGASTRODUODENOSCOPY (EGD);  Surgeon: Daneil Dolin, MD;  [facility];  Service: Endoscopy;  Laterality: N/A;  . EUS N/A 04/21/2017   Procedure: UPPER ENDOSCOPIC ULTRASOUND (EUS) LINEAR;  Surgeon: Owens Loffler  P, MD;  [facility] ENDOSCOPY;  Service: Endoscopy;  Laterality: N/A;  . FISTULA SUPERFICIALIZATION Left 03/31/2015   Procedure: CEPHALIC VEIN TURNDOWN; PLICATION OF BRACHIOCEPHALIC AVF;  Surgeon: Conrad Williamson, MD;  Location: [place];  Service: Vascular;  Laterality: Left;  . FISTULOGRAM N/A 10/19/2013   Procedure: FISTULOGRAM;  Surgeon: Rosetta Posner, MD;  [facility];  Service: Cardiovascular;  Laterality: N/A;  . Left arm AV fistula  2009   Dr. [name]  . PERIPHERAL VASCULAR  CATHETERIZATION N/A 03/10/2015   Procedure: Fistulagram;  Surgeon: Conrad , MD;  Location: [place] CV LAB;  Service: Cardiovascular;  Laterality: N/A;  . Right nephrectomy          Home Medications    Prior to Admission medications   Medication Sig Start Date End Date Taking? Authorizing Provider  allopurinol (ZYLOPRIM) 100 MG tablet Take 100 mg by mouth daily.     [provider]  blood glucose meter kit and supplies Dispense based on patient and insurance preference. Use up to four times daily as directed. (FOR ICD-9 250.00, 250.01). 01/22/17   [name], MD  carvedilol (COREG) 25 MG tablet TAKE 1 TABLET TWICE DAILY WITH MEALS Patient taking differently: TAKE 1/2 TABLET TWICE DAILY WITH MEALS 04/21/17   [name], MD  cinacalcet (SENSIPAR) 30 MG tablet Take 30 mg by mouth at bedtime.     [provider]  diltiazem (CARDIZEM CD) 180 MG 24 hr capsule Take 1 capsule (180 mg total) by mouth daily. 05/30/17 11/18/17  [name], MD  docusate sodium (COLACE) 100 MG capsule Take 100 mg by mouth daily.    [provider]  fenofibrate 160 MG tablet Take 160 mg every evening by mouth.     [provider]  fish oil-omega-3 fatty acids 1000 MG capsule Take 1 g by mouth 3 (three) times daily.     [provider]  gabapentin (NEURONTIN) 300 MG capsule Take 300 mg by mouth.    [provider]  glipiZIDE (GLUCOTROL XL) 5 MG 24 hr tablet Take 5 mg by mouth daily.  02/13/17   [provider]  HYDROcodone-acetaminophen (NORCO/VICODIN) 5-325 MG tablet Take 1-2 tablets by mouth every 6 (six) hours as needed. 01/31/18   Davonna Belling, MD  Insulin Pen Needle 31G X 5 MM MISC Use as directed 01/22/17   [name], MD  pantoprazole (PROTONIX) 40 MG tablet Take 1 tablet (40 mg total) by mouth daily. 04/05/17   Virl Cagey, MD  pravastatin (PRAVACHOL) 10 MG tablet Take 5 mg by mouth every evening.      [provider]  predniSONE (DELTASONE) 20 MG tablet Take 2 tablets (40 mg total) by mouth daily. 01/31/18   Davonna Belling, MD  sevelamer carbonate (RENVELA) 800 MG tablet Take 1,600-3,200 mg by mouth See admin instructions. Take 3200 mg after each meal and 1600 mg each snack    [provider]  Tetrahydrozoline HCl (VISINE OP) Place 2 drops into both eyes daily as needed (dry eyes).    [provider]  TRADJENTA 5 MG TABS tablet daily. 04/13/17   [provider]  warfarin (COUMADIN) 2.5 MG tablet TAKE 1 TABLET EVERY DAY EXCEPT TAKE  1/2 TABLET ON SUNDAYS, TUESDAYS AND THURSDAYS OR AS DIRECTED Patient taking differently: Takes 1 tablet daily except Wednesdays; Wednesdays takes 1.5 tablet 02/23/17   [name], MD  Wheat Dextrin (BENEFIBER DRINK MIX PO) Take 1 Dose  by mouth daily. 1 tbsp     [provider]    Family History Family History  Problem Relation Age of Onset  . Hypertension Mother   . Hypertension Unknown        Siblings  . Diabetes type II Unknown        Siblings  . Colon cancer Neg Hx     Social History Social History   Tobacco Use  . Smoking status: Former Smoker    Types: Cigarettes    Last attempt to quit: 07/04/1998    Years since quitting: 19.5  . Smokeless tobacco: Never Used  . Tobacco comment: Quit May 2000  Substance Use Topics  . Alcohol use: No    Alcohol/week: 0.0 standard drinks    Comment: Quit 2014; Previously drank about 3 drinks a day  . Drug use: No     Allergies   Metformin and related   Review of Systems Review of Systems  Constitutional: Negative for appetite change.  HENT: Negative for congestion.   Respiratory: Negative for shortness of breath.   Cardiovascular: Negative for chest pain.  Gastrointestinal: Negative for abdominal pain.  Musculoskeletal: Positive for back pain.  Neurological: Negative for weakness.       Patient has peripheral neuropathy.  Hematological: Negative  for adenopathy.     Physical Exam Updated Vital Signs BP 129/80 (BP Location: Right Arm)   Pulse (!) 52   Temp (!) 97.5 F (36.4 C) (Oral)   Resp 16   Ht _0  (1.88 m)   Wt 99.8 kg   SpO2 99%   BMI 28.25 kg/m   Physical Exam  Constitutional: He appears well-developed.  HENT:  Head: Normocephalic.  Cardiovascular: Normal rate.  Abdominal: There is no tenderness.  Musculoskeletal: He exhibits tenderness.  Tenderness in left SI joint area.  No rash.  No skin changes.  Good range of motion in left hip.  Neurovascular intact grossly in bilateral feet.  Neurological: He is alert.  Skin: Skin is warm. Capillary refill takes less than 2 seconds.     ED Treatments / Results  Labs (all labs ordered are listed, but only abnormal results are displayed) Labs Reviewed - No data to display  EKG None  Radiology No results found.  Procedures Procedures (including critical care time)  Medications Ordered in ED Medications - No data to display   Initial Impression / Assessment and Plan / ED Course  I have reviewed the triage vital signs and the nursing notes.  Pertinent labs & imaging results that were available during my care of the patient were reviewed by me and considered in my medical decision making (see chart for details).     Patient with posterior lower back/hip pain.  I think more likely sciatica.  Does have a malignancy history but no current active cancer.  On anticoagulation.  No fevers.  Benign exam.  I think it is reasonable to try with some pain control and steroids.  Does have diabetes history.  Will do short-term follow-up with PCP if symptoms do not improve and was given other red flag instructions.  Final Clinical Impressions(s) / ED Diagnoses   Final diagnoses:  Sciatica of left side    ED Discharge Orders         Ordered    predniSONE (DELTASONE) 20 MG tablet  Daily     01/31/18 0711    HYDROcodone-acetaminophen (NORCO/VICODIN) 5-325 MG tablet   Every 6 hours PRN     01/31/18  [place], Oneal Schoenberger, MD 01/31/18 [phone]

## 2018-02-01 ENCOUNTER — Ambulatory Visit (HOSPITAL_COMMUNITY): Payer: Medicare HMO | Admitting: Physical Therapy

## 2018-02-01 ENCOUNTER — Encounter (HOSPITAL_COMMUNITY): Payer: Self-pay

## 2018-02-02 DIAGNOSIS — Z1159 Encounter for screening for other viral diseases: Secondary | ICD-10-CM | POA: Diagnosis not present

## 2018-02-02 DIAGNOSIS — N186 End stage renal disease: Secondary | ICD-10-CM | POA: Diagnosis not present

## 2018-02-02 DIAGNOSIS — E119 Type 2 diabetes mellitus without complications: Secondary | ICD-10-CM | POA: Diagnosis not present

## 2018-02-02 DIAGNOSIS — Z992 Dependence on renal dialysis: Secondary | ICD-10-CM | POA: Diagnosis not present

## 2018-02-03 DIAGNOSIS — M5136 Other intervertebral disc degeneration, lumbar region: Secondary | ICD-10-CM | POA: Diagnosis not present

## 2018-02-03 DIAGNOSIS — Z6828 Body mass index (BMI) 28.0-28.9, adult: Secondary | ICD-10-CM | POA: Diagnosis not present

## 2018-02-03 DIAGNOSIS — E663 Overweight: Secondary | ICD-10-CM | POA: Diagnosis not present

## 2018-02-03 DIAGNOSIS — M545 Low back pain: Secondary | ICD-10-CM | POA: Diagnosis not present

## 2018-02-03 DIAGNOSIS — M5410 Radiculopathy, site unspecified: Secondary | ICD-10-CM | POA: Diagnosis not present

## 2018-02-04 DIAGNOSIS — Z992 Dependence on renal dialysis: Secondary | ICD-10-CM | POA: Diagnosis not present

## 2018-02-04 DIAGNOSIS — N186 End stage renal disease: Secondary | ICD-10-CM | POA: Diagnosis not present

## 2018-02-07 DIAGNOSIS — N186 End stage renal disease: Secondary | ICD-10-CM | POA: Diagnosis not present

## 2018-02-07 DIAGNOSIS — Z992 Dependence on renal dialysis: Secondary | ICD-10-CM | POA: Diagnosis not present

## 2018-02-09 DIAGNOSIS — Z992 Dependence on renal dialysis: Secondary | ICD-10-CM | POA: Diagnosis not present

## 2018-02-09 DIAGNOSIS — N186 End stage renal disease: Secondary | ICD-10-CM | POA: Diagnosis not present

## 2018-02-11 DIAGNOSIS — Z992 Dependence on renal dialysis: Secondary | ICD-10-CM | POA: Diagnosis not present

## 2018-02-11 DIAGNOSIS — N186 End stage renal disease: Secondary | ICD-10-CM | POA: Diagnosis not present

## 2018-02-13 DIAGNOSIS — N186 End stage renal disease: Secondary | ICD-10-CM | POA: Diagnosis not present

## 2018-02-13 DIAGNOSIS — Z992 Dependence on renal dialysis: Secondary | ICD-10-CM | POA: Diagnosis not present

## 2018-02-14 DIAGNOSIS — N186 End stage renal disease: Secondary | ICD-10-CM | POA: Diagnosis not present

## 2018-02-14 DIAGNOSIS — Z992 Dependence on renal dialysis: Secondary | ICD-10-CM | POA: Diagnosis not present

## 2018-02-18 DIAGNOSIS — N186 End stage renal disease: Secondary | ICD-10-CM | POA: Diagnosis not present

## 2018-02-18 DIAGNOSIS — Z992 Dependence on renal dialysis: Secondary | ICD-10-CM | POA: Diagnosis not present

## 2018-02-21 DIAGNOSIS — N186 End stage renal disease: Secondary | ICD-10-CM | POA: Diagnosis not present

## 2018-02-21 DIAGNOSIS — Z992 Dependence on renal dialysis: Secondary | ICD-10-CM | POA: Diagnosis not present

## 2018-02-22 DIAGNOSIS — Z992 Dependence on renal dialysis: Secondary | ICD-10-CM | POA: Diagnosis not present

## 2018-02-22 DIAGNOSIS — N186 End stage renal disease: Secondary | ICD-10-CM | POA: Diagnosis not present

## 2018-02-23 DIAGNOSIS — N186 End stage renal disease: Secondary | ICD-10-CM | POA: Diagnosis not present

## 2018-02-23 DIAGNOSIS — Z992 Dependence on renal dialysis: Secondary | ICD-10-CM | POA: Diagnosis not present

## 2018-02-24 DIAGNOSIS — E782 Mixed hyperlipidemia: Secondary | ICD-10-CM | POA: Diagnosis not present

## 2018-02-24 DIAGNOSIS — Z1389 Encounter for screening for other disorder: Secondary | ICD-10-CM | POA: Diagnosis not present

## 2018-02-24 DIAGNOSIS — Z0001 Encounter for general adult medical examination with abnormal findings: Secondary | ICD-10-CM | POA: Diagnosis not present

## 2018-02-24 DIAGNOSIS — I10 Essential (primary) hypertension: Secondary | ICD-10-CM | POA: Diagnosis not present

## 2018-02-24 DIAGNOSIS — E1129 Type 2 diabetes mellitus with other diabetic kidney complication: Secondary | ICD-10-CM | POA: Diagnosis not present

## 2018-02-24 DIAGNOSIS — Z6827 Body mass index (BMI) 27.0-27.9, adult: Secondary | ICD-10-CM | POA: Diagnosis not present

## 2018-02-25 DIAGNOSIS — N186 End stage renal disease: Secondary | ICD-10-CM | POA: Diagnosis not present

## 2018-02-25 DIAGNOSIS — Z992 Dependence on renal dialysis: Secondary | ICD-10-CM | POA: Diagnosis not present

## 2018-02-28 DIAGNOSIS — Z992 Dependence on renal dialysis: Secondary | ICD-10-CM | POA: Diagnosis not present

## 2018-02-28 DIAGNOSIS — N186 End stage renal disease: Secondary | ICD-10-CM | POA: Diagnosis not present

## 2018-03-02 DIAGNOSIS — Z992 Dependence on renal dialysis: Secondary | ICD-10-CM | POA: Diagnosis not present

## 2018-03-02 DIAGNOSIS — N186 End stage renal disease: Secondary | ICD-10-CM | POA: Diagnosis not present

## 2018-03-04 DIAGNOSIS — N186 End stage renal disease: Secondary | ICD-10-CM | POA: Diagnosis not present

## 2018-03-04 DIAGNOSIS — Z992 Dependence on renal dialysis: Secondary | ICD-10-CM | POA: Diagnosis not present

## 2018-03-06 ENCOUNTER — Other Ambulatory Visit: Payer: Self-pay | Admitting: Cardiology

## 2018-03-07 DIAGNOSIS — N186 End stage renal disease: Secondary | ICD-10-CM | POA: Diagnosis not present

## 2018-03-07 DIAGNOSIS — E119 Type 2 diabetes mellitus without complications: Secondary | ICD-10-CM | POA: Diagnosis not present

## 2018-03-07 DIAGNOSIS — Z992 Dependence on renal dialysis: Secondary | ICD-10-CM | POA: Diagnosis not present

## 2018-03-09 DIAGNOSIS — N186 End stage renal disease: Secondary | ICD-10-CM | POA: Diagnosis not present

## 2018-03-09 DIAGNOSIS — Z992 Dependence on renal dialysis: Secondary | ICD-10-CM | POA: Diagnosis not present

## 2018-03-11 DIAGNOSIS — Z992 Dependence on renal dialysis: Secondary | ICD-10-CM | POA: Diagnosis not present

## 2018-03-11 DIAGNOSIS — N186 End stage renal disease: Secondary | ICD-10-CM | POA: Diagnosis not present

## 2018-03-14 DIAGNOSIS — N186 End stage renal disease: Secondary | ICD-10-CM | POA: Diagnosis not present

## 2018-03-14 DIAGNOSIS — Z992 Dependence on renal dialysis: Secondary | ICD-10-CM | POA: Diagnosis not present

## 2018-03-16 DIAGNOSIS — N186 End stage renal disease: Secondary | ICD-10-CM | POA: Diagnosis not present

## 2018-03-16 DIAGNOSIS — Z992 Dependence on renal dialysis: Secondary | ICD-10-CM | POA: Diagnosis not present

## 2018-03-18 DIAGNOSIS — N186 End stage renal disease: Secondary | ICD-10-CM | POA: Diagnosis not present

## 2018-03-18 DIAGNOSIS — Z992 Dependence on renal dialysis: Secondary | ICD-10-CM | POA: Diagnosis not present

## 2018-03-20 ENCOUNTER — Encounter (HOSPITAL_COMMUNITY): Payer: Self-pay | Admitting: Emergency Medicine

## 2018-03-20 ENCOUNTER — Inpatient Hospital Stay (HOSPITAL_COMMUNITY)
Admission: EM | Admit: 2018-03-20 | Discharge: 2018-04-14 | DRG: 239 | Disposition: A | Discharge status: Skilled Nursing Facility | Payer: Medicare HMO | Attending: Internal Medicine | Admitting: Internal Medicine

## 2018-03-20 ENCOUNTER — Other Ambulatory Visit: Payer: Self-pay

## 2018-03-20 ENCOUNTER — Emergency Department (HOSPITAL_COMMUNITY): Payer: Medicare HMO

## 2018-03-20 DIAGNOSIS — L089 Local infection of the skin and subcutaneous tissue, unspecified: Secondary | ICD-10-CM | POA: Diagnosis not present

## 2018-03-20 DIAGNOSIS — Z419 Encounter for procedure for purposes other than remedying health state, unspecified: Secondary | ICD-10-CM

## 2018-03-20 DIAGNOSIS — Z85528 Personal history of other malignant neoplasm of kidney: Secondary | ICD-10-CM

## 2018-03-20 DIAGNOSIS — Z86010 Personal history of colonic polyps: Secondary | ICD-10-CM

## 2018-03-20 DIAGNOSIS — L97519 Non-pressure chronic ulcer of other part of right foot with unspecified severity: Secondary | ICD-10-CM | POA: Diagnosis not present

## 2018-03-20 DIAGNOSIS — Z7952 Long term (current) use of systemic steroids: Secondary | ICD-10-CM

## 2018-03-20 DIAGNOSIS — M109 Gout, unspecified: Secondary | ICD-10-CM | POA: Diagnosis present

## 2018-03-20 DIAGNOSIS — E1142 Type 2 diabetes mellitus with diabetic polyneuropathy: Secondary | ICD-10-CM | POA: Diagnosis not present

## 2018-03-20 DIAGNOSIS — R791 Abnormal coagulation profile: Secondary | ICD-10-CM | POA: Diagnosis present

## 2018-03-20 DIAGNOSIS — E11621 Type 2 diabetes mellitus with foot ulcer: Secondary | ICD-10-CM | POA: Diagnosis present

## 2018-03-20 DIAGNOSIS — I70235 Atherosclerosis of native arteries of right leg with ulceration of other part of foot: Secondary | ICD-10-CM | POA: Diagnosis not present

## 2018-03-20 DIAGNOSIS — G4733 Obstructive sleep apnea (adult) (pediatric): Secondary | ICD-10-CM | POA: Diagnosis present

## 2018-03-20 DIAGNOSIS — L03116 Cellulitis of left lower limb: Secondary | ICD-10-CM | POA: Diagnosis not present

## 2018-03-20 DIAGNOSIS — Z79899 Other long term (current) drug therapy: Secondary | ICD-10-CM

## 2018-03-20 DIAGNOSIS — Z89431 Acquired absence of right foot: Secondary | ICD-10-CM | POA: Diagnosis not present

## 2018-03-20 DIAGNOSIS — I4821 Permanent atrial fibrillation: Secondary | ICD-10-CM | POA: Diagnosis present

## 2018-03-20 DIAGNOSIS — I739 Peripheral vascular disease, unspecified: Secondary | ICD-10-CM | POA: Diagnosis not present

## 2018-03-20 DIAGNOSIS — E8889 Other specified metabolic disorders: Secondary | ICD-10-CM | POA: Diagnosis present

## 2018-03-20 DIAGNOSIS — Z905 Acquired absence of kidney: Secondary | ICD-10-CM

## 2018-03-20 DIAGNOSIS — Z992 Dependence on renal dialysis: Secondary | ICD-10-CM | POA: Diagnosis not present

## 2018-03-20 DIAGNOSIS — E1152 Type 2 diabetes mellitus with diabetic peripheral angiopathy with gangrene: Principal | ICD-10-CM | POA: Diagnosis present

## 2018-03-20 DIAGNOSIS — I771 Stricture of artery: Secondary | ICD-10-CM | POA: Diagnosis present

## 2018-03-20 DIAGNOSIS — D638 Anemia in other chronic diseases classified elsewhere: Secondary | ICD-10-CM | POA: Diagnosis not present

## 2018-03-20 DIAGNOSIS — G5790 Unspecified mononeuropathy of unspecified lower limb: Secondary | ICD-10-CM | POA: Diagnosis present

## 2018-03-20 DIAGNOSIS — E785 Hyperlipidemia, unspecified: Secondary | ICD-10-CM | POA: Diagnosis not present

## 2018-03-20 DIAGNOSIS — I998 Other disorder of circulatory system: Secondary | ICD-10-CM | POA: Diagnosis not present

## 2018-03-20 DIAGNOSIS — I10 Essential (primary) hypertension: Secondary | ICD-10-CM | POA: Diagnosis present

## 2018-03-20 DIAGNOSIS — Z87891 Personal history of nicotine dependence: Secondary | ICD-10-CM

## 2018-03-20 DIAGNOSIS — Z7901 Long term (current) use of anticoagulants: Secondary | ICD-10-CM

## 2018-03-20 DIAGNOSIS — E782 Mixed hyperlipidemia: Secondary | ICD-10-CM | POA: Diagnosis present

## 2018-03-20 DIAGNOSIS — I70261 Atherosclerosis of native arteries of extremities with gangrene, right leg: Secondary | ICD-10-CM | POA: Diagnosis not present

## 2018-03-20 DIAGNOSIS — Z8719 Personal history of other diseases of the digestive system: Secondary | ICD-10-CM

## 2018-03-20 DIAGNOSIS — R34 Anuria and oliguria: Secondary | ICD-10-CM | POA: Diagnosis present

## 2018-03-20 DIAGNOSIS — L97529 Non-pressure chronic ulcer of other part of left foot with unspecified severity: Secondary | ICD-10-CM | POA: Diagnosis present

## 2018-03-20 DIAGNOSIS — R066 Hiccough: Secondary | ICD-10-CM | POA: Diagnosis not present

## 2018-03-20 DIAGNOSIS — D631 Anemia in chronic kidney disease: Secondary | ICD-10-CM | POA: Diagnosis present

## 2018-03-20 DIAGNOSIS — Z01810 Encounter for preprocedural cardiovascular examination: Secondary | ICD-10-CM | POA: Diagnosis not present

## 2018-03-20 DIAGNOSIS — L97419 Non-pressure chronic ulcer of right heel and midfoot with unspecified severity: Secondary | ICD-10-CM | POA: Diagnosis present

## 2018-03-20 DIAGNOSIS — I482 Chronic atrial fibrillation, unspecified: Secondary | ICD-10-CM | POA: Diagnosis not present

## 2018-03-20 DIAGNOSIS — I480 Paroxysmal atrial fibrillation: Secondary | ICD-10-CM | POA: Diagnosis not present

## 2018-03-20 DIAGNOSIS — I120 Hypertensive chronic kidney disease with stage 5 chronic kidney disease or end stage renal disease: Secondary | ICD-10-CM | POA: Diagnosis present

## 2018-03-20 DIAGNOSIS — M87078 Idiopathic aseptic necrosis of left toe(s): Secondary | ICD-10-CM | POA: Diagnosis not present

## 2018-03-20 DIAGNOSIS — E119 Type 2 diabetes mellitus without complications: Secondary | ICD-10-CM | POA: Diagnosis not present

## 2018-03-20 DIAGNOSIS — N186 End stage renal disease: Secondary | ICD-10-CM | POA: Diagnosis not present

## 2018-03-20 DIAGNOSIS — I96 Gangrene, not elsewhere classified: Secondary | ICD-10-CM | POA: Diagnosis present

## 2018-03-20 DIAGNOSIS — I70234 Atherosclerosis of native arteries of right leg with ulceration of heel and midfoot: Secondary | ICD-10-CM | POA: Diagnosis not present

## 2018-03-20 DIAGNOSIS — I953 Hypotension of hemodialysis: Secondary | ICD-10-CM | POA: Diagnosis not present

## 2018-03-20 DIAGNOSIS — M25572 Pain in left ankle and joints of left foot: Secondary | ICD-10-CM | POA: Diagnosis not present

## 2018-03-20 DIAGNOSIS — N2581 Secondary hyperparathyroidism of renal origin: Secondary | ICD-10-CM | POA: Diagnosis not present

## 2018-03-20 DIAGNOSIS — Z8249 Family history of ischemic heart disease and other diseases of the circulatory system: Secondary | ICD-10-CM

## 2018-03-20 DIAGNOSIS — Z743 Need for continuous supervision: Secondary | ICD-10-CM | POA: Diagnosis not present

## 2018-03-20 DIAGNOSIS — I70262 Atherosclerosis of native arteries of extremities with gangrene, left leg: Secondary | ICD-10-CM | POA: Diagnosis not present

## 2018-03-20 DIAGNOSIS — I4891 Unspecified atrial fibrillation: Secondary | ICD-10-CM | POA: Diagnosis not present

## 2018-03-20 DIAGNOSIS — E1122 Type 2 diabetes mellitus with diabetic chronic kidney disease: Secondary | ICD-10-CM | POA: Diagnosis not present

## 2018-03-20 DIAGNOSIS — S91102A Unspecified open wound of left great toe without damage to nail, initial encounter: Secondary | ICD-10-CM | POA: Diagnosis not present

## 2018-03-20 DIAGNOSIS — I509 Heart failure, unspecified: Secondary | ICD-10-CM | POA: Diagnosis not present

## 2018-03-20 DIAGNOSIS — L97523 Non-pressure chronic ulcer of other part of left foot with necrosis of muscle: Secondary | ICD-10-CM | POA: Diagnosis not present

## 2018-03-20 DIAGNOSIS — I70213 Atherosclerosis of native arteries of extremities with intermittent claudication, bilateral legs: Secondary | ICD-10-CM | POA: Diagnosis not present

## 2018-03-20 DIAGNOSIS — I4819 Other persistent atrial fibrillation: Secondary | ICD-10-CM | POA: Diagnosis not present

## 2018-03-20 DIAGNOSIS — L03115 Cellulitis of right lower limb: Secondary | ICD-10-CM | POA: Diagnosis not present

## 2018-03-20 DIAGNOSIS — L97524 Non-pressure chronic ulcer of other part of left foot with necrosis of bone: Secondary | ICD-10-CM | POA: Diagnosis not present

## 2018-03-20 DIAGNOSIS — Z4781 Encounter for orthopedic aftercare following surgical amputation: Secondary | ICD-10-CM | POA: Diagnosis not present

## 2018-03-20 DIAGNOSIS — N16 Renal tubulo-interstitial disorders in diseases classified elsewhere: Secondary | ICD-10-CM | POA: Diagnosis not present

## 2018-03-20 DIAGNOSIS — R52 Pain, unspecified: Secondary | ICD-10-CM | POA: Diagnosis not present

## 2018-03-20 DIAGNOSIS — I132 Hypertensive heart and chronic kidney disease with heart failure and with stage 5 chronic kidney disease, or end stage renal disease: Secondary | ICD-10-CM | POA: Diagnosis not present

## 2018-03-20 DIAGNOSIS — L8990 Pressure ulcer of unspecified site, unspecified stage: Secondary | ICD-10-CM

## 2018-03-20 DIAGNOSIS — R279 Unspecified lack of coordination: Secondary | ICD-10-CM | POA: Diagnosis not present

## 2018-03-20 DIAGNOSIS — Z794 Long term (current) use of insulin: Secondary | ICD-10-CM

## 2018-03-20 DIAGNOSIS — M79671 Pain in right foot: Secondary | ICD-10-CM | POA: Diagnosis not present

## 2018-03-20 DIAGNOSIS — L97513 Non-pressure chronic ulcer of other part of right foot with necrosis of muscle: Secondary | ICD-10-CM | POA: Diagnosis not present

## 2018-03-20 DIAGNOSIS — Z89432 Acquired absence of left foot: Secondary | ICD-10-CM | POA: Diagnosis not present

## 2018-03-20 DIAGNOSIS — Z833 Family history of diabetes mellitus: Secondary | ICD-10-CM

## 2018-03-20 DIAGNOSIS — I70245 Atherosclerosis of native arteries of left leg with ulceration of other part of foot: Secondary | ICD-10-CM | POA: Diagnosis not present

## 2018-03-20 LAB — BASIC METABOLIC PANEL
Anion gap: 15 (ref 5–15)
BUN: 82 mg/dL — ABNORMAL HIGH (ref 8–23)
CO2: 25 mmol/L (ref 22–32)
CREATININE: 11.37 mg/dL — AB (ref 0.61–1.24)
Calcium: 8.4 mg/dL — ABNORMAL LOW (ref 8.9–10.3)
Chloride: 99 mmol/L (ref 98–111)
GFR calc Af Amer: 5 mL/min — ABNORMAL LOW (ref >60)
GFR calc non Af Amer: 4 mL/min — ABNORMAL LOW (ref >60)
Glucose, Bld: 114 mg/dL — ABNORMAL HIGH (ref 70–99)
Potassium: 4.9 mmol/L (ref 3.5–5.1)
Sodium: 139 mmol/L (ref 135–145)

## 2018-03-20 LAB — CBC WITH DIFFERENTIAL/PLATELET
Abs Immature Granulocytes: 0.03 10*3/uL (ref 0.00–0.07)
Basophils Absolute: 0 10*3/uL (ref 0.0–0.1)
Basophils Relative: 0 %
Eosinophils Absolute: 0.1 10*3/uL (ref 0.0–0.5)
Eosinophils Relative: 1 %
HCT: 38.4 % — ABNORMAL LOW (ref 39.0–52.0)
Hemoglobin: 11.6 g/dL — ABNORMAL LOW (ref 13.0–17.0)
Immature Granulocytes: 1 %
Lymphocytes Relative: 13 %
Lymphs Abs: 0.7 10*3/uL (ref 0.7–4.0)
MCH: 28.4 pg (ref 26.0–34.0)
MCHC: 30.2 g/dL (ref 30.0–36.0)
MCV: 93.9 fL (ref 80.0–100.0)
MONO ABS: 0.7 10*3/uL (ref 0.1–1.0)
Monocytes Relative: 11 %
NEUTROS ABS: 4.2 10*3/uL (ref 1.7–7.7)
Neutrophils Relative %: 74 %
Platelets: 211 10*3/uL (ref 150–400)
RBC: 4.09 MIL/uL — ABNORMAL LOW (ref 4.22–5.81)
RDW: 14.6 % (ref 11.5–15.5)
WBC: 5.7 10*3/uL (ref 4.0–10.5)
nRBC: 0 % (ref 0.0–0.2)

## 2018-03-20 LAB — PROTIME-INR
INR: 3.79
Prothrombin Time: 36.8 seconds — ABNORMAL HIGH (ref 11.4–15.2)

## 2018-03-20 MED ORDER — ALLOPURINOL 100 MG PO TABS
100.0000 mg | ORAL_TABLET | Freq: Every day | ORAL | Status: DC
  Administered 2018-03-21 – 2018-04-14 (×25): 100 mg via ORAL
  Filled 2018-03-20 (×28): qty 1

## 2018-03-20 MED ORDER — FENOFIBRATE 160 MG PO TABS: 160.0000 mg | ORAL_TABLET | Freq: Every evening | ORAL | Status: DC

## 2018-03-20 MED ORDER — SEVELAMER CARBONATE 800 MG PO TABS
3200.0000 mg | ORAL_TABLET | Freq: Three times a day (TID) | ORAL | Status: DC
  Administered 2018-03-21 – 2018-04-14 (×41): 3200 mg via ORAL
  Filled 2018-03-20 (×52): qty 4

## 2018-03-20 MED ORDER — HYDROCODONE-ACETAMINOPHEN 5-325 MG PO TABS
1.0000 | ORAL_TABLET | Freq: Four times a day (QID) | ORAL | Status: DC | PRN
  Administered 2018-03-21: 1 via ORAL
  Administered 2018-03-22: 2 via ORAL
  Administered 2018-03-22: 1 via ORAL
  Administered 2018-03-23 – 2018-03-26 (×9): 2 via ORAL
  Administered 2018-03-27: 1 via ORAL
  Administered 2018-03-28 – 2018-03-29 (×2): 2 via ORAL
  Administered 2018-03-29: 1 via ORAL
  Administered 2018-03-29 – 2018-04-03 (×7): 2 via ORAL
  Administered 2018-04-04: 1 via ORAL
  Administered 2018-04-05 – 2018-04-06 (×4): 2 via ORAL
  Administered 2018-04-07: 1 via ORAL
  Filled 2018-03-20 (×11): qty 2
  Filled 2018-03-20: qty 1
  Filled 2018-03-20 (×2): qty 2
  Filled 2018-03-20 (×2): qty 1
  Filled 2018-03-20 (×6): qty 2
  Filled 2018-03-20 (×2): qty 1
  Filled 2018-03-20 (×3): qty 2

## 2018-03-20 MED ORDER — DOCUSATE SODIUM 100 MG PO CAPS
100.0000 mg | ORAL_CAPSULE | Freq: Every day | ORAL | Status: DC
  Administered 2018-03-21 – 2018-03-30 (×10): 100 mg via ORAL
  Filled 2018-03-20 (×10): qty 1

## 2018-03-20 MED ORDER — INSULIN ASPART 100 UNIT/ML ~~LOC~~ SOLN
0.0000 [IU] | Freq: Three times a day (TID) | SUBCUTANEOUS | Status: DC
  Administered 2018-03-22: 1 [IU] via SUBCUTANEOUS
  Administered 2018-03-22: 2 [IU] via SUBCUTANEOUS
  Administered 2018-03-23: 1 [IU] via SUBCUTANEOUS
  Administered 2018-03-23: 2 [IU] via SUBCUTANEOUS
  Administered 2018-03-24 (×2): 1 [IU] via SUBCUTANEOUS
  Administered 2018-03-25 – 2018-03-26 (×2): 2 [IU] via SUBCUTANEOUS
  Administered 2018-03-27: 1 [IU] via SUBCUTANEOUS
  Administered 2018-03-27: 3 [IU] via SUBCUTANEOUS
  Administered 2018-03-28 – 2018-03-30 (×6): 1 [IU] via SUBCUTANEOUS
  Administered 2018-03-30: 2 [IU] via SUBCUTANEOUS
  Administered 2018-03-31 – 2018-04-02 (×5): 1 [IU] via SUBCUTANEOUS
  Administered 2018-04-03: 2 [IU] via SUBCUTANEOUS
  Administered 2018-04-03: 1 [IU] via SUBCUTANEOUS
  Administered 2018-04-04: 2 [IU] via SUBCUTANEOUS
  Administered 2018-04-04: 1 [IU] via SUBCUTANEOUS
  Administered 2018-04-05: 2 [IU] via SUBCUTANEOUS
  Administered 2018-04-05: 1 [IU] via SUBCUTANEOUS
  Administered 2018-04-05: 2 [IU] via SUBCUTANEOUS
  Administered 2018-04-06 (×2): 1 [IU] via SUBCUTANEOUS
  Administered 2018-04-06: 2 [IU] via SUBCUTANEOUS
  Administered 2018-04-07: 1 [IU] via SUBCUTANEOUS
  Administered 2018-04-07 – 2018-04-09 (×4): 2 [IU] via SUBCUTANEOUS
  Administered 2018-04-09: 3 [IU] via SUBCUTANEOUS
  Administered 2018-04-10: 1 [IU] via SUBCUTANEOUS
  Administered 2018-04-10: 2 [IU] via SUBCUTANEOUS
  Administered 2018-04-10: 1 [IU] via SUBCUTANEOUS
  Administered 2018-04-11: 2 [IU] via SUBCUTANEOUS
  Administered 2018-04-11 (×2): 1 [IU] via SUBCUTANEOUS
  Administered 2018-04-12 (×2): 2 [IU] via SUBCUTANEOUS
  Administered 2018-04-13: 1 [IU] via SUBCUTANEOUS
  Administered 2018-04-13 – 2018-04-14 (×3): 2 [IU] via SUBCUTANEOUS

## 2018-03-20 MED ORDER — PANTOPRAZOLE SODIUM 40 MG PO TBEC
40.0000 mg | DELAYED_RELEASE_TABLET | Freq: Every day | ORAL | Status: DC
  Administered 2018-03-21 – 2018-04-14 (×25): 40 mg via ORAL
  Filled 2018-03-20 (×26): qty 1

## 2018-03-20 MED ORDER — DILTIAZEM HCL ER COATED BEADS 180 MG PO CP24
180.0000 mg | ORAL_CAPSULE | Freq: Every day | ORAL | Status: DC
  Administered 2018-03-21 – 2018-04-14 (×24): 180 mg via ORAL
  Filled 2018-03-20 (×28): qty 1

## 2018-03-20 MED ORDER — CEFAZOLIN SODIUM-DEXTROSE 1-4 GM/50ML-% IV SOLN
1.0000 g | INTRAVENOUS | Status: DC
  Administered 2018-03-20 – 2018-03-29 (×10): 1 g via INTRAVENOUS
  Administered 2018-03-29: 2 g via INTRAVENOUS
  Administered 2018-03-30 – 2018-04-01 (×3): 1 g via INTRAVENOUS
  Filled 2018-03-20 (×15): qty 50

## 2018-03-20 MED ORDER — CINACALCET HCL 30 MG PO TABS: 30.0000 mg | ORAL_TABLET | Freq: Every day | ORAL | Status: DC

## 2018-03-20 MED ORDER — CARVEDILOL 12.5 MG PO TABS
12.5000 mg | ORAL_TABLET | Freq: Two times a day (BID) | ORAL | Status: DC
  Administered 2018-03-21 – 2018-03-28 (×13): 12.5 mg via ORAL
  Filled 2018-03-20 (×13): qty 1

## 2018-03-20 MED ORDER — PRAVASTATIN SODIUM 10 MG PO TABS
5.0000 mg | ORAL_TABLET | Freq: Every evening | ORAL | Status: DC
  Administered 2018-03-21 – 2018-04-13 (×22): 5 mg via ORAL
  Filled 2018-03-20 (×21): qty 1
  Filled 2018-03-20: qty 0.5
  Filled 2018-03-20 (×2): qty 1

## 2018-03-20 NOTE — ED Provider Notes (Signed)
[facility] EMERGENCY DEPARTMENT Provider Note   CSN: [id number] Arrival date & time: 03/20/18  1618     History   Chief Complaint Chief Complaint  Patient presents with  . Leg Pain    HPI Albert [name] is a 63 y.o. male with a history significant for hypertension, type 2 diabetes with bilateral foot neuropathy, end-stage renal disease on dialysis, atrial fibrillation on Coumadin therapy presenting with left posterior distal Achilles tendon pain along with a lesion of his skin at the site of pain.  He denies any injury to the site.  He states the area looks bruised, but again denies injury.  He mentions that he also has some darkened skin of his distal second toe of the same foot for which he has seen his PCP and is scheduled to start care treatment in 5 days.  He has edema and redness along medial aspect of this foot and heel, there is tenderness, stating he has difficulty resting the leg for sleep as any direct pressure causes pain.  He has taken Tylenol with no relief of symptoms.  The history is provided by the patient.    Past Medical History:  Diagnosis Date  . Anemia   . Atrial fibrillation ([place])   . Cardiomyopathy    LVEF 50-55% 8/11 - [facility]  . Colonic polyp   . Diverticulosis of colon   . ESRD on hemodialysis ([place])    Dr. Lowanda Foster [facility]  . Essential hypertension, benign   . Gout   . Mixed hyperlipidemia   . Neuropathy    in both feet  . OSA (obstructive sleep apnea)    CPAP  . Renal cell cancer ([place])   . Type 2 diabetes mellitus ([place])    Type II    Patient Active Problem List   Diagnosis Date Noted  . Cellulitis of left lower extremity 03/20/2018  . Pancreas cyst 05/13/2017  . Abnormal CT scan, stomach   . Cholelithiasis 04/05/2017  . Biliary dyskinesia 04/05/2017  . Abdominal pain 03/11/2017  . Abdominal swelling 03/11/2017  . Hyperglycemia 01/21/2017  . Gastritis and gastroduodenitis   . Abnormal findings on esophagogastroduodenoscopy (EGD)   .  GI bleed 01/05/2017  . Heme positive stool   . Gastrointestinal hemorrhage   . Supratherapeutic INR   . Coagulopathy ([place]) 01/03/2017  . Anemia 01/03/2017  . Constipation 12/17/2015  . Rectal bleeding 12/17/2015  . History of colonic polyps 12/17/2015  . Visit for suture removal 12/06/2013  . Bradycardia 10/12/2013  . Metabolic acidosis 99/35/7017  . Acute respiratory failure with hypercapnia ([place]) 10/12/2013  . Encounter for therapeutic drug monitoring 03/29/2013  . Atrial fibrillation ([place]) 03/10/2011  . Long term (current) use of anticoagulants 03/10/2011  . Secondary cardiomyopathy, unspecified 03/10/2011  . ESRD (end stage renal disease) ([place]) 03/10/2011  . HYPERLIPIDEMIA 08/24/2007  . PRIMARY CENTRAL SLEEP APNEA 08/24/2007  . OBSTRUCTIVE SLEEP APNEA 08/24/2007  . Essential hypertension, benign 08/24/2007    Past Surgical History:  Procedure Laterality Date  . BIOPSY  01/05/2017   Procedure: BIOPSY;  Surgeon: Daneil Dolin, MD;  [facility];  Service: Endoscopy;;  gastric  . COLONOSCOPY N/A 01/07/2016   Dr. [name]: Grade 1 internal hemorrhoids, otherwise normal  . COLONOSCOPY W/ POLYPECTOMY  2009, 2012   2009: Dr. [name]: 1.25 cm adenomatous polyp without high grade dysplasia, distal to IC valve. 2012: normal rectum, few pancolonic diverticula, single diminutive hyperplastic polyp  . ESOPHAGOGASTRODUODENOSCOPY  2009   Dr. [name]: accentuated undulating  Z-lin with couple of islands of salmon-colored epithelium suspicious for Barrett's, s/p biopsy with path revealing mild inflammation, no metaplasia, dysplasia, or malignancy  . ESOPHAGOGASTRODUODENOSCOPY N/A 01/05/2017   Procedure: ESOPHAGOGASTRODUODENOSCOPY (EGD);  Surgeon: Daneil Dolin, MD;  [facility];  Service: Endoscopy;  Laterality: N/A;  . EUS N/A 04/21/2017   Procedure: UPPER ENDOSCOPIC ULTRASOUND (EUS) LINEAR;  Surgeon: Milus Banister, MD;  [facility];  Service: Endoscopy;   Laterality: N/A;  . FISTULA SUPERFICIALIZATION Left 03/31/2015   Procedure: CEPHALIC VEIN TURNDOWN; PLICATION OF BRACHIOCEPHALIC AVF;  Surgeon: Conrad [place], MD;  Location: [place];  Service: Vascular;  Laterality: Left;  . FISTULOGRAM N/A 10/19/2013   Procedure: FISTULOGRAM;  Surgeon: Rosetta Posner, MD;  Location: [facility] CATH LAB;  Service: Cardiovascular;  Laterality: N/A;  . Left arm AV fistula  2009   Dr. [name]  . PERIPHERAL VASCULAR CATHETERIZATION N/A 03/10/2015   Procedure: Fistulagram;  Surgeon: Conrad Rudy, MD;  Location: [place] CV LAB;  Service: Cardiovascular;  Laterality: N/A;  . Right nephrectomy          Home Medications    Prior to Admission medications   Medication Sig Start Date End Date Taking? Authorizing Provider  allopurinol (ZYLOPRIM) 100 MG tablet Take 100 mg by mouth daily.     [provider]  blood glucose meter kit and supplies Dispense based on patient and insurance preference. Use up to four times daily as directed. (FOR ICD-9 250.00, 250.01). 01/22/17   [name], MD  carvedilol (COREG) 25 MG tablet TAKE 1 TABLET TWICE DAILY WITH MEALS Patient taking differently: TAKE 1/2 TABLET TWICE DAILY WITH MEALS 04/21/17   [name], MD  cinacalcet (SENSIPAR) 30 MG tablet Take 30 mg by mouth at bedtime.     [provider]  diltiazem (CARDIZEM CD) 180 MG 24 hr capsule TAKE 1 CAPSULE EVERY DAY 03/07/18   [name], MD  docusate sodium (COLACE) 100 MG capsule Take 100 mg by mouth daily.    [provider]  fenofibrate 160 MG tablet Take 160 mg every evening by mouth.     [provider]  fish oil-omega-3 fatty acids 1000 MG capsule Take 1 g by mouth 3 (three) times daily.     [provider]  gabapentin (NEURONTIN) 300 MG capsule Take 300 mg by mouth.    [provider]  glipiZIDE (GLUCOTROL XL) 5 MG 24 hr tablet Take 5 mg by mouth daily.  02/13/17   [provider]    HYDROcodone-acetaminophen (NORCO/VICODIN) 5-325 MG tablet Take 1-2 tablets by mouth every 6 (six) hours as needed. 01/31/18   Davonna Belling, MD  Insulin Pen Needle 31G X 5 MM MISC Use as directed 01/22/17   [name], MD  pantoprazole (PROTONIX) 40 MG tablet Take 1 tablet (40 mg total) by mouth daily. 04/05/17   Virl Cagey, MD  pravastatin (PRAVACHOL) 10 MG tablet Take 5 mg by mouth every evening.     [provider]  predniSONE (DELTASONE) 20 MG tablet Take 2 tablets (40 mg total) by mouth daily. 01/31/18   Davonna Belling, MD  sevelamer carbonate (RENVELA) 800 MG tablet Take 1,600-3,200 mg by mouth See admin instructions. Take 3200 mg after each meal and 1600 mg each snack    [provider]  Tetrahydrozoline HCl (VISINE OP) Place 2 drops into both eyes daily as needed (dry eyes).    [provider]  TRADJENTA 5  MG TABS tablet daily. 04/13/17   [provider]  warfarin (COUMADIN) 2.5 MG tablet TAKE 1 TABLET EVERY DAY EXCEPT TAKE  1/2 TABLET ON SUNDAYS, TUESDAYS AND THURSDAYS OR AS DIRECTED Patient taking differently: Takes 1 tablet daily except Wednesdays; Wednesdays takes 1.5 tablet 02/23/17   [name], MD  Wheat Dextrin (BENEFIBER DRINK MIX PO) Take 1 Dose by mouth daily. 1 tbsp     [provider]    Family History Family History  Problem Relation Age of Onset  . Hypertension Mother   . Hypertension Other        Siblings  . Diabetes type II Other        Siblings  . Colon cancer Neg Hx     Social History Social History   Tobacco Use  . Smoking status: Former Smoker    Types: Cigarettes    Last attempt to quit: 07/04/1998    Years since quitting: 19.7  . Smokeless tobacco: Never Used  . Tobacco comment: Quit May 2000  Substance Use Topics  . Alcohol use: No    Alcohol/week: 0.0 standard drinks    Comment: Quit 2014; Previously drank about 3 drinks a day  . Drug use: No     Allergies   Metformin  and related   Review of Systems Review of Systems  Constitutional: Negative for fever.  Musculoskeletal: Positive for arthralgias and joint swelling. Negative for myalgias.  Skin: Positive for color change.  Neurological: Negative for weakness and numbness.     Physical Exam Updated Vital Signs BP 114/70 (BP Location: Right Arm)   Pulse 100   Temp (!) 97.5 F (36.4 C) (Oral)   Resp 16   Ht '6\' 2"'  (1.88 m)   Wt 99.8 kg   SpO2 100%   BMI 28.25 kg/m   Physical Exam Constitutional:      Appearance: He is well-developed.  HENT:     Head: Atraumatic.  Neck:     Musculoskeletal: Normal range of motion.  Cardiovascular:     Comments: Pulses equal bilaterally Musculoskeletal:        General: Swelling and tenderness present.     Left ankle: Achilles tendon exhibits pain.     Comments: Patient is exquisitely tender to palpation at the site of a necrotic appearing lesion at his distal posterior left Achilles.  There is erythema of the left heel which radiates along his instep.  There is no increased warmth, there is some edema appreciated.  His distal second toe of this foot appears to have dry gangrene.  His right second toe is completely necrotic and hard to palpation.  Nontender.  Skin:    General: Skin is warm and dry.  Neurological:     Mental Status: He is alert.     Sensory: No sensory deficit.     Deep Tendon Reflexes: Reflexes normal.            ED Treatments / Results  Labs (all labs ordered are listed, but only abnormal results are displayed) Labs Reviewed  CBC WITH DIFFERENTIAL/PLATELET - Abnormal; Notable for the following components:      Result Value   RBC 4.09 (*)    Hemoglobin 11.6 (*)    HCT 38.4 (*)    All other components within normal limits  BASIC METABOLIC PANEL - Abnormal; Notable for the following components:   Glucose, Bld 114 (*)    BUN 82 (*)    Creatinine, Ser 11.37 (*)  Calcium 8.4 (*)    GFR calc non Af Amer 4 (*)    GFR calc  Af Amer 5 (*)    All other components within normal limits  PROTIME-INR - Abnormal; Notable for the following components:   Prothrombin Time 36.8 (*)    All other components within normal limits    EKG None  Radiology Dg Ankle 2 Views Left  Result Date: 03/20/2018 CLINICAL DATA:  Pain. EXAM: LEFT ANKLE - 2 VIEW COMPARISON:  None. FINDINGS: There is no evidence of fracture, dislocation, or joint effusion. There is no evidence of arthropathy or other focal bone abnormality. Soft tissues are unremarkable. IMPRESSION: Negative. Electronically Signed   By: Misty Stanley M.D.   On: 03/20/2018 20:14   Dg Foot Complete Left  Result Date: 03/20/2018 CLINICAL DATA:  Soft tissue wound at second toe and posterior heel. EXAM: LEFT FOOT - COMPLETE 3+ VIEW COMPARISON:  None. FINDINGS: No evidence for an acute fracture. No subluxation or dislocation. No bony destruction evident in the second toe or calcaneus to suggest overt osteomyelitis. No gas evident within the soft tissues. IMPRESSION: Negative. Electronically Signed   By: Misty Stanley M.D.   On: 03/20/2018 20:13   Dg Foot Complete Right  Result Date: 03/20/2018 CLINICAL DATA:  Left foot pain EXAM: RIGHT FOOT COMPLETE - 3+ VIEW COMPARISON:  None. FINDINGS: Severe diffuse vascular calcifications. Plantar calcaneal spur. No acute bony abnormality. Specifically, no fracture, subluxation, or dislocation. IMPRESSION: No acute bony abnormality. Electronically Signed   By: Rolm Baptise M.D.   On: 03/20/2018 20:13    Procedures Procedures (including critical care time)  Medications Ordered in ED Medications - No data to display   Initial Impression / Assessment and Plan / ED Course  I have reviewed the triage vital signs and the nursing notes.  Pertinent labs & imaging results that were available during my care of the patient were reviewed by me and considered in my medical decision making (see chart for details).     Patient with probable  cellulitis/infection in the left heel with necrosis of the distal second toe, complete necrosis of the entire right second toe.  Patient will need admission for IV antibiotics and consideration of amputation of the right second toe.  Plan to admit to the hospitalist service.  Patient was seen by Dr. Roderic Palau during this ED visit.  Final Clinical Impressions(s) / ED Diagnoses   Final diagnoses:  Necrosis of toe [facility])  Foot infection    ED Discharge Orders    None       Landis Martins 03/20/18 2043    Milton Ferguson, MD 03/20/18 2333

## 2018-03-20 NOTE — ED Notes (Signed)
Pt does not complain of pain at this time. States he only feels pain when his left foot touches the bed. Left knee has towels under leg to keep foot from touching bed.

## 2018-03-20 NOTE — Progress Notes (Signed)
Pharmacy Antibiotic Note  Albert [name] is a 63 y.o. male admitted on 03/20/2018 with wound infection.  Pharmacy has been consulted for Cefazolin dosing.  Plan: Cefazolin 1000 mg IV every 24 hours  Monitor labs c/s, and patient improvement.  Height: 6\' 2"  (188 cm) Weight: 220 lb (99.8 kg) IBW/kg (Calculated) : 82.2  Temp (24hrs), Avg:97.5 F (36.4 C), Min:97.5 F (36.4 C), Max:97.5 F (36.4 C)  Recent Labs  Lab 03/20/18 1931  WBC 5.7  CREATININE 11.37*    Estimated Creatinine Clearance: 8.3 mL/min (A) (by C-G formula based on SCr of 11.37 mg/dL (H)).    Allergies  Allergen Reactions  . Metformin And Related Other (See Comments)    Diarrhea, bloody stool, upset stomach.     Antimicrobials this admission: Cefazolin 1/27 >>      Dose adjustments this admission: Cefazolin  Microbiology results: None pending  Thank you for allowing pharmacy to be a part of this patient's care.  Ramond Craver 03/20/2018 9:13 PM

## 2018-03-20 NOTE — H&P (Signed)
History and Physical    PLEASE NOTE THAT DRAGON DICTATION SOFTWARE WAS USED IN THE CONSTRUCTION OF THIS NOTE.   Albert [name]:[id number] DOB: 12/05/1955 DOA: 03/20/2018  PCP: Redmond School, MD Patient coming from: Home  I have personally briefly reviewed patient's old medical records in [place]  Chief Complaint: Left heel pain  HPI: Albert [name] is a 63 y.o. male with medical history significant for ESRD on HD, paroxysmal atrial fibrillation chronically anticoagulated on warfarin, type 2 diabetes mellitus complicated by peripheral polyneuropathy, who is admitted to [facility] on 03/20/2018 with cellulitis of the left lower extremity after presenting from home to the [facility] emergency department complaining of left heel discomfort.  The patient reports 5 to 6 days of progressive tenderness associated with the left heel and associated with erythema extending into the medial aspect of the left foot.  He also notes mild associated swelling over that timeframe.  Denies any recent trauma involving the left lower extremity.  Left lower extremity discomfort worsens with attempts to ambulate as well as when laying supine, inducing contact of the left heel with the bed. Denies any associated subjective fever, chills, or rigors. No recent abx use.   While now not a component of his chief complaint, further discussions with the patient regarding the status of his bilateral feet reveals that the patient has been experiencing a dark/black coloration associated with the right second toe over the course of the last month in the absence of any preceding trauma.  He denies any associated discomfort with the right second toe, but acknowledges concomitant peripheral polyneuropathy implicating the bilateral feet.  Denies any associated drainage.  The patient also acknowledges dark/black coloration involving the distal aspect of the left second toe over a similar  timeframe, also in the absence of any preceding trauma. Left second toe is also not associate with any discomfort or drainage.  The patient reports that he was scheduled to establish with outpatient wound clinic, with first appointment scheduled for this Friday, 03/24/18.   Past medical history is also notable for end-stage renal disease, with patient reporting that he has been on hemodialysis over the course of the last 5 years.  He reports a Tuesday, Thursday, Saturday HD schedule, and confirms that his last HD occurred on Saturday, 03/18/18.  Denies any shortness of breath at this time.    ED Course: Vital signs in the emergency department were notable for the following: T-max 97.5, heart rate 100, blood pressure 114/70, respiratory rate 16, and oxygen saturation 100% on room air.  Labs performed in the ED were notable for the following: BMP notable for sodium 139, potassium 4.9, bicarbonate 25.  CBC notable for white blood cell count of 5700 with 74% neutrophils, hemoglobin 11.6.  INR 3.79.  Plain films of the left ankle, per final radiology report, showed no evidence of acute bony abnormality, including no evidence of fracture dislocation.  Additionally, no joint effusion was appreciated.   While in the ED, the patient received a dose of Ancef, and was subsequently admitted to the [place] floor for further evaluation and management of suspected left lower extremity cellulitis.    Review of Systems: As per HPI otherwise 10 point review of systems negative.   Past Medical History:  Diagnosis Date  . Anemia   . Atrial fibrillation ([place])   . Cardiomyopathy    LVEF 50-55% 8/11 - [facility]  . Colonic polyp   . Diverticulosis of colon   .  ESRD on hemodialysis ([place])    Dr. Lowanda Foster [facility]  . Essential hypertension, benign   . Gout   . Mixed hyperlipidemia   . Neuropathy    in both feet  . OSA (obstructive sleep apnea)    CPAP  . Renal cell cancer ([place])   . Type 2 diabetes mellitus ([place])    Type  II    Past Surgical History:  Procedure Laterality Date  . BIOPSY  01/05/2017   Procedure: BIOPSY;  Surgeon: Daneil Dolin, MD;  [facility];  Service: Endoscopy;;  gastric  . COLONOSCOPY N/A 01/07/2016   Dr. [name]: Grade 1 internal hemorrhoids, otherwise normal  . COLONOSCOPY W/ POLYPECTOMY  2009, 2012   2009: Dr. [name]: 1.25 cm adenomatous polyp without high grade dysplasia, distal to IC valve. 2012: normal rectum, few pancolonic diverticula, single diminutive hyperplastic polyp  . ESOPHAGOGASTRODUODENOSCOPY  2009   Dr. [name]: accentuated undulating Z-lin with couple of islands of salmon-colored epithelium suspicious for Barrett's, s/p biopsy with path revealing mild inflammation, no metaplasia, dysplasia, or malignancy  . ESOPHAGOGASTRODUODENOSCOPY N/A 01/05/2017   Procedure: ESOPHAGOGASTRODUODENOSCOPY (EGD);  Surgeon: Daneil Dolin, MD;  [facility];  Service: Endoscopy;  Laterality: N/A;  . EUS N/A 04/21/2017   Procedure: UPPER ENDOSCOPIC ULTRASOUND (EUS) LINEAR;  Surgeon: Milus Banister, MD;  Location: [facility];  Service: Endoscopy;  Laterality: N/A;  . FISTULA SUPERFICIALIZATION Left 03/31/2015   Procedure: CEPHALIC VEIN TURNDOWN; PLICATION OF BRACHIOCEPHALIC AVF;  Surgeon: Conrad Ventura, MD;  Location: [facility];  Service: Vascular;  Laterality: Left;  . FISTULOGRAM N/A 10/19/2013   Procedure: FISTULOGRAM;  Surgeon: Rosetta Posner, MD;  Location: [facility] CATH LAB;  Service: Cardiovascular;  Laterality: N/A;  . Left arm AV fistula  2009   Dr. [name]  . PERIPHERAL VASCULAR CATHETERIZATION N/A 03/10/2015   Procedure: Fistulagram;  Surgeon: Conrad Sutter, MD;  Location: [facility] CV LAB;  Service: Cardiovascular;  Laterality: N/A;  . Right nephrectomy      Social History:  reports that he quit smoking about 19 years ago. His smoking use included cigarettes. He has never used smokeless tobacco. He reports that he does not drink alcohol or use drugs.   Allergies    Allergen Reactions  . Metformin And Related Other (See Comments)    Diarrhea, bloody stool, upset stomach.     Family History  Problem Relation Age of Onset  . Hypertension Mother   . Hypertension Other        Siblings  . Diabetes type II Other        Siblings  . Colon cancer Neg Hx      Prior to Admission medications   Medication Sig Start Date End Date Taking? Authorizing Provider  allopurinol (ZYLOPRIM) 100 MG tablet Take 100 mg by mouth daily.     [provider]  blood glucose meter kit and supplies Dispense based on patient and insurance preference. Use up to four times daily as directed. (FOR ICD-9 250.00, 250.01). 01/22/17   [name], MD  carvedilol (COREG) 25 MG tablet TAKE 1 TABLET TWICE DAILY WITH MEALS Patient taking differently: TAKE 1/2 TABLET TWICE DAILY WITH MEALS 04/21/17   [name], MD  cinacalcet (SENSIPAR) 30 MG tablet Take 30 mg by mouth at bedtime.     [provider]  diltiazem (CARDIZEM CD) 180 MG 24 hr capsule TAKE 1 CAPSULE EVERY DAY 03/07/18   [name], MD  docusate sodium (  COLACE) 100 MG capsule Take 100 mg by mouth daily.    [provider]  fenofibrate 160 MG tablet Take 160 mg every evening by mouth.     [provider]  fish oil-omega-3 fatty acids 1000 MG capsule Take 1 g by mouth 3 (three) times daily.     [provider]  gabapentin (NEURONTIN) 300 MG capsule Take 300 mg by mouth.    [provider]  glipiZIDE (GLUCOTROL XL) 5 MG 24 hr tablet Take 5 mg by mouth daily.  02/13/17   [provider]  HYDROcodone-acetaminophen (NORCO/VICODIN) 5-325 MG tablet Take 1-2 tablets by mouth every 6 (six) hours as needed. 01/31/18   Davonna Belling, MD  Insulin Pen Needle 31G X 5 MM MISC Use as directed 01/22/17   [name], MD  pantoprazole (PROTONIX) 40 MG tablet Take 1 tablet (40 mg total) by mouth daily. 04/05/17   Virl Cagey, MD  pravastatin  (PRAVACHOL) 10 MG tablet Take 5 mg by mouth every evening.     [provider]  predniSONE (DELTASONE) 20 MG tablet Take 2 tablets (40 mg total) by mouth daily. 01/31/18   Davonna Belling, MD  sevelamer carbonate (RENVELA) 800 MG tablet Take 1,600-3,200 mg by mouth See admin instructions. Take 3200 mg after each meal and 1600 mg each snack    [provider]  Tetrahydrozoline HCl (VISINE OP) Place 2 drops into both eyes daily as needed (dry eyes).    [provider]  TRADJENTA 5 MG TABS tablet daily. 04/13/17   [provider]  warfarin (COUMADIN) 2.5 MG tablet TAKE 1 TABLET EVERY DAY EXCEPT TAKE  1/2 TABLET ON SUNDAYS, TUESDAYS AND THURSDAYS OR AS DIRECTED Patient taking differently: Takes 1 tablet daily except Wednesdays; Wednesdays takes 1.5 tablet 02/23/17   [name], MD  Wheat Dextrin (BENEFIBER DRINK MIX PO) Take 1 Dose by mouth daily. 1 tbsp     [provider]     Objective     Physical Exam: Vitals:   03/20/18 1630 03/20/18 1631  BP:  114/70  Pulse:  100  Resp:  16  Temp:  (!) 97.5 F (36.4 C)  TempSrc:  Oral  SpO2:  100%  Weight: 99.8 kg   Height: '6\' 2"'  (1.88 m)     General: appears to be stated age; alert, oriented Skin: erythema associated with the posterior aspect of the left heel, with extension of erythema into the medial aspect of the left foot and associated with tenderness and mild swelling; dark/black coloration associated with the right second toe as well as the distal aspect of the left second toe.  Head:  AT/[place] Mouth:  Oral mucosa membranes appear moist, normal dentition Neck: supple; trachea midline Heart:  RRR; did not appreciate any M/R/G Lungs: CTAB, did not appreciate any wheezes, rales, or rhonchi Abdomen: + BS; soft, ND, NT Extremities: erythema/tenderness associated with the left lower extremity, as further described above as well as dark/black appearance of the bilateral second toes, as further  described above; No muscle wasting   Labs on Admission: I have personally reviewed following labs and imaging studies  CBC: Recent Labs  Lab 03/20/18 1931  WBC 5.7  NEUTROABS 4.2  HGB 11.6*  HCT 38.4*  MCV 93.9  PLT 975   Basic Metabolic Panel: Recent Labs  Lab 03/20/18 1931  NA 139  K 4.9  CL 99  CO2 25  GLUCOSE 114*  BUN 82*  CREATININE 11.37*  CALCIUM  8.4*   GFR: Estimated Creatinine Clearance: 8.3 mL/min (A) (by C-G formula based on SCr of 11.37 mg/dL (H)). Liver Function Tests: No results for input(s): AST, ALT, ALKPHOS, BILITOT, PROT, ALBUMIN in the last 168 hours. No results for input(s): LIPASE, AMYLASE in the last 168 hours. No results for input(s): AMMONIA in the last 168 hours. Coagulation Profile: Recent Labs  Lab 03/20/18 1933  INR 3.79   Cardiac Enzymes: No results for input(s): CKTOTAL, CKMB, CKMBINDEX, TROPONINI in the last 168 hours. BNP (last 3 results) No results for input(s): PROBNP in the last 8760 hours. HbA1C: No results for input(s): HGBA1C in the last 72 hours. CBG: No results for input(s): GLUCAP in the last 168 hours. Lipid Profile: No results for input(s): CHOL, HDL, LDLCALC, TRIG, CHOLHDL, LDLDIRECT in the last 72 hours. Thyroid Function Tests: No results for input(s): TSH, T4TOTAL, FREET4, T3FREE, THYROIDAB in the last 72 hours. Anemia Panel: No results for input(s): VITAMINB12, FOLATE, FERRITIN, TIBC, IRON, RETICCTPCT in the last 72 hours. Urine analysis:    Component Value Date/Time   COLORURINE YELLOW 10/12/2013 1045   APPEARANCEUR CLEAR 10/12/2013 1045   LABSPEC 1.015 10/12/2013 1045   PHURINE 8.0 10/12/2013 1045   GLUCOSEU NEGATIVE 10/12/2013 1045   HGBUR MODERATE (A) 10/12/2013 1045   BILIRUBINUR NEGATIVE 10/12/2013 1045   KETONESUR NEGATIVE 10/12/2013 1045   PROTEINUR 30 (A) 10/12/2013 1045   UROBILINOGEN 1.0 10/12/2013 1045   NITRITE NEGATIVE 10/12/2013 1045   LEUKOCYTESUR NEGATIVE 10/12/2013 1045     Radiological Exams on Admission: Dg Ankle 2 Views Left  Result Date: 03/20/2018 CLINICAL DATA:  Pain. EXAM: LEFT ANKLE - 2 VIEW COMPARISON:  None. FINDINGS: There is no evidence of fracture, dislocation, or joint effusion. There is no evidence of arthropathy or other focal bone abnormality. Soft tissues are unremarkable. IMPRESSION: Negative. Electronically Signed   By: Misty Stanley M.D.   On: 03/20/2018 20:14   Dg Foot Complete Left  Result Date: 03/20/2018 CLINICAL DATA:  Soft tissue wound at second toe and posterior heel. EXAM: LEFT FOOT - COMPLETE 3+ VIEW COMPARISON:  None. FINDINGS: No evidence for an acute fracture. No subluxation or dislocation. No bony destruction evident in the second toe or calcaneus to suggest overt osteomyelitis. No gas evident within the soft tissues. IMPRESSION: Negative. Electronically Signed   By: Misty Stanley M.D.   On: 03/20/2018 20:13   Dg Foot Complete Right  Result Date: 03/20/2018 CLINICAL DATA:  Left foot pain EXAM: RIGHT FOOT COMPLETE - 3+ VIEW COMPARISON:  None. FINDINGS: Severe diffuse vascular calcifications. Plantar calcaneal spur. No acute bony abnormality. Specifically, no fracture, subluxation, or dislocation. IMPRESSION: No acute bony abnormality. Electronically Signed   By: Rolm Baptise M.D.   On: 03/20/2018 20:13     Assessment/Plan   Albert [name] is a 63 y.o. male with medical history significant for ESRD on HD, paroxysmal atrial fibrillation chronically anticoagulated on warfarin, type 2 diabetes mellitus complicated by peripheral polyneuropathy, who is admitted to [facility] on 03/20/2018 with cellulitis of the left lower extremity after presenting from home to the [facility] emergency department complaining of left heel discomfort.   Principal Problem:   Cellulitis of left lower extremity Active Problems:   Essential hypertension, benign   Atrial fibrillation (HCC)   ESRD (end stage renal disease) (HCC)    Supratherapeutic INR    #) Left lower extremity cellulitis: Diagnosis on the basis of erythema associated with the posterior aspect of the left heel  extending along the medial aspect of the left foot and associated with tenderness and mild swelling.  There appears to be a healing abrasion associated with the left heel, potentially representing portal of entry for infection.  In the absence of any associated purulent discharge, MRSA appears to be less likely at this time.  Additionally, no evidence of necrotizing fascitis at present. Does not appear septic at this time in the absence of any SIRS criteria. While in the ED this evening, received dose of Ancef, which, in the absence of evidence of MRSA in the absence of associated sepsis, appears to be a reasonable choice at this juncture.   Plan: Continue Ancef, as described above. I have ordered blood cultures x2 to be collected at this time.  Recheck CBC in the morning.  PRN Norco for pain.  I have placed nursing communication orders requesting that the current distribution of erythema associated with the left lower extremity be outlined, and that the left lower extremity be elevated.     #) Necrotic appearing second toes, bilaterally: The patient reports that he first noted the appearance of dark/black appearing second toes, bilaterally, approximately one month ago. Denies any associated discomfort, but in the context of peripheral polyneuropathy. No drainage. Cool to touch.   Plan: while not directly associated with his chief complaint, may warrant consultation of ortho vs gen surg for evaluation of necessity of potential amputation.     #) End-stage renal disease: On hemodialysis over the last 5 years, with Tuesday, Thursday, and Saturday schedule.  On the basis of the schedule, will be due for HD tomorrow (Tuesday).  Notable presenting labs include BMP showing serum potassium of 4.9 and bicarbonate 25.  Plan: will need routine HD tomorrow  (Tuesday).  Monitor strict I's and O's.  Repeat BMP in the morning.  Check serum magnesium and phosphorus level. Continue Phos binder and sensipar. Renal diet ordered.      #) Type 2 diabetes mellitus: On glipizide and Tradjenta as an outpatient, in the absence of taking any exogenous insulin.  Associated with peripheral polyneuropathy.  Presenting blood sugar per presenting BMP noted to be 114.  Plan: We will hold home glipizide and Tradjenta during this hospitalization.  I have ordered Accu-Cheks qac and hs with SSI.      #) Paroxysmal atrial fibrillation: Chronically anticoagulated on warfarin, with presenting INR noted to be slightly supratherapeutic at 3.79.  Outpatient AV nodal blocking agents include Coreg as well as long-acting diltiazem.   Plan: Continue outpatient Coreg and diltiazem. Will hold outpatient warfarin for now given presenting supratherapeutic INR, with plan to repeat INR in the morning.     DVT prophylaxis: chronically anticoagulated on warfarin, with supratherapeutic presenting INR. Code Status: Full Family Communication: (none) Disposition Plan:  Per Rounding Team Consults called: (none) Admission status: inpatient; med-surg.     PLEASE NOTE THAT DRAGON DICTATION SOFTWARE WAS USED IN THE CONSTRUCTION OF THIS NOTE.   [facility] Pager [phone] From [place].    03/20/2018, 8:37 PM

## 2018-03-20 NOTE — ED Triage Notes (Addendum)
Patient reports L leg pain that started 1 week ago. Became worse last night. No warmth, redness, or edema per EMS. BS 147.

## 2018-03-21 ENCOUNTER — Inpatient Hospital Stay (HOSPITAL_COMMUNITY): Payer: Medicare HMO

## 2018-03-21 ENCOUNTER — Other Ambulatory Visit: Payer: Self-pay

## 2018-03-21 DIAGNOSIS — I739 Peripheral vascular disease, unspecified: Secondary | ICD-10-CM

## 2018-03-21 DIAGNOSIS — I4891 Unspecified atrial fibrillation: Secondary | ICD-10-CM

## 2018-03-21 DIAGNOSIS — I96 Gangrene, not elsewhere classified: Secondary | ICD-10-CM

## 2018-03-21 LAB — CBC
HCT: 35.1 % — ABNORMAL LOW (ref 39.0–52.0)
Hemoglobin: 10.7 g/dL — ABNORMAL LOW (ref 13.0–17.0)
MCH: 28.2 pg (ref 26.0–34.0)
MCHC: 30.5 g/dL (ref 30.0–36.0)
MCV: 92.6 fL (ref 80.0–100.0)
Platelets: 207 10*3/uL (ref 150–400)
RBC: 3.79 MIL/uL — ABNORMAL LOW (ref 4.22–5.81)
RDW: 14.6 % (ref 11.5–15.5)
WBC: 6.4 10*3/uL (ref 4.0–10.5)
nRBC: 0 % (ref 0.0–0.2)

## 2018-03-21 LAB — GLUCOSE, CAPILLARY
GLUCOSE-CAPILLARY: 88 mg/dL (ref 70–99)
Glucose-Capillary: 102 mg/dL — ABNORMAL HIGH (ref 70–99)
Glucose-Capillary: 86 mg/dL (ref 70–99)

## 2018-03-21 LAB — BASIC METABOLIC PANEL
Anion gap: 16 — ABNORMAL HIGH (ref 5–15)
BUN: 88 mg/dL — ABNORMAL HIGH (ref 8–23)
CO2: 23 mmol/L (ref 22–32)
CREATININE: 11.53 mg/dL — AB (ref 0.61–1.24)
Calcium: 8.3 mg/dL — ABNORMAL LOW (ref 8.9–10.3)
Chloride: 102 mmol/L (ref 98–111)
GFR calc Af Amer: 5 mL/min — ABNORMAL LOW (ref >60)
GFR calc non Af Amer: 4 mL/min — ABNORMAL LOW (ref >60)
Glucose, Bld: 122 mg/dL — ABNORMAL HIGH (ref 70–99)
Potassium: 4.8 mmol/L (ref 3.5–5.1)
Sodium: 141 mmol/L (ref 135–145)

## 2018-03-21 LAB — PHOSPHORUS: PHOSPHORUS: 6.8 mg/dL — AB (ref 2.5–4.6)

## 2018-03-21 LAB — PROTIME-INR
INR: 4.52
PROTHROMBIN TIME: 42.2 s — AB (ref 11.4–15.2)

## 2018-03-21 LAB — MAGNESIUM: Magnesium: 2.1 mg/dL (ref 1.7–2.4)

## 2018-03-21 MED ORDER — CHLORHEXIDINE GLUCONATE CLOTH 2 % EX PADS
6.0000 | MEDICATED_PAD | Freq: Every day | CUTANEOUS | Status: DC
  Administered 2018-03-22: 6 via TOPICAL

## 2018-03-21 MED ORDER — PENTAFLUOROPROP-TETRAFLUOROETH EX AERO: 1.0000 "application " | INHALATION_SPRAY | CUTANEOUS | Status: DC | PRN

## 2018-03-21 MED ORDER — DOXERCALCIFEROL 4 MCG/2ML IV SOLN
3.5000 ug | INTRAVENOUS | Status: DC
  Administered 2018-03-21 – 2018-04-13 (×11): 3.5 ug via INTRAVENOUS
  Filled 2018-03-21 (×10): qty 2

## 2018-03-21 MED ORDER — HEPARIN SODIUM (PORCINE) 1000 UNIT/ML DIALYSIS
40.0000 [IU]/kg | INTRAMUSCULAR | Status: DC | PRN
  Administered 2018-03-21: 4000 [IU] via INTRAVENOUS_CENTRAL
  Filled 2018-03-21: qty 4

## 2018-03-21 MED ORDER — SEVELAMER CARBONATE 800 MG PO TABS
1600.0000 mg | ORAL_TABLET | ORAL | Status: DC
  Administered 2018-03-23 – 2018-04-13 (×13): 1600 mg via ORAL
  Filled 2018-03-21 (×10): qty 2

## 2018-03-21 MED ORDER — LIDOCAINE-PRILOCAINE 2.5-2.5 % EX CREA: 1.0000 "application " | TOPICAL_CREAM | CUTANEOUS | Status: DC | PRN

## 2018-03-21 MED ORDER — CINACALCET HCL 30 MG PO TABS
30.0000 mg | ORAL_TABLET | Freq: Every day | ORAL | Status: DC
  Administered 2018-03-21 – 2018-04-13 (×21): 30 mg via ORAL
  Filled 2018-03-21 (×22): qty 1

## 2018-03-21 MED ORDER — SODIUM CHLORIDE 0.9 % IV SOLN: 100.0000 mL | INTRAVENOUS | Status: DC | PRN

## 2018-03-21 MED ORDER — LIDOCAINE HCL (PF) 1 % IJ SOLN: 5.0000 mL | INTRAMUSCULAR | Status: DC | PRN

## 2018-03-21 NOTE — Procedures (Addendum)
    HEMODIALYSIS NURSING NOTE:  Records obtained from [facility]: 255 min run time EDW 97kg Qb 300 / Qd 600 2K / 2.5Ca Hectorol 3.5 mcg tiw TTS   Today's inpatient HD orders were "completed" in [facility] by another user prior my being able to modify goal and treatment time.   ------------------------------------------------------------------------------------------------------  HEMODIALYSIS TREATMENT NOTE:  Uneventful 4.25 hour low-heparin dialysis completed.  AVF tolerated prescribed Qb 400 with stable VP and AP.  Goal met: 2130cc removed.  Ultrafiltration rate was decreased twice in response to declining (asyptomatic) hypotension.  All blood was returned and hemostasis was achieved in 20 minutes.  Rockwell Alexandria, RN

## 2018-03-21 NOTE — Progress Notes (Addendum)
PROGRESS NOTE    Albert [name]  YQM:[id number] DOB: 1955-07-13 DOA: 03/20/2018 PCP: Redmond School, MD    Brief Narrative:  63 y/o male with ESRD on HD, A fib on coumadin, admitted to the hospital with left heel cellulitis. He is also noted to have necrotic/dry gangrene of 2nd toe on right foot. Vascular work up in process.    Assessment & Plan:   Principal Problem:   Cellulitis of left lower extremity Active Problems:   Essential hypertension, benign   Atrial fibrillation (HCC)   ESRD (end stage renal disease) (HCC)   Supratherapeutic INR   1. Cellulitis of left heel. Currently on ancef. Overall redness improving. No fevers. Continue current treatment 2. Dry gangrene of right second toe. Pulses are difficult to palpate, but foot is warm.  ABIs performed noted that vessels and ankle did not compress bilaterally.  Discussed with Dr. [name] on-call for vascular surgery who felt the patient would likely need to be transferred to [facility] for arteriogram.  Would not perform the procedure urgently since symptoms have been present for weeks and INR is supratherapeutic.  Recheck INR in a.m. and likely transfer to [facility] since it may take several days to arrange arteriogram. 3. ESRD on HD. Nephrology following. Plan for HD today 4. A fib, permanent. Heart rate stable. He is chronically on coumadin with supratherapeutic INR. Will hold further coumadin and let INR drift down 5. Diabetes type 2, blood sugars have been stable. Oral glipizide and tradjenta currently on hold. Continue sliding scale insulin    DVT prophylaxis: coumadin Code Status: full code Family Communication: discussed with patient Disposition Plan: likely transfer to [facility] in AM if INR trending down   Consultants:   Nephrology  Procedures:     Antimicrobials:   Ancef 1/27>    Subjective: Patient has pain in his left heel and at the base of toes on right foot  Objective: Vitals:   03/21/18 0500  03/21/18 0700 03/21/18 0800 03/21/18 0820  BP: 132/80 111/66 118/65 128/83  Pulse: 67 (!) 107 73 (!) 103  Resp: 17 18 18 20   Temp:    98.3 F (36.8 C)  TempSrc:      SpO2: 99% 99% 100% 96%  Weight:      Height:        Intake/Output Summary (Last 24 hours) at 03/21/2018 1536 Last data filed at 03/21/2018 0610 Gross per 24 hour  Intake 263.01 ml  Output -  Net 263.01 ml   Filed Weights   03/20/18 1630  Weight: 99.8 kg    Examination:  General exam: Appears calm and comfortable  Respiratory system: Clear to auscultation. Respiratory effort normal. Cardiovascular system: S1 & S2 heard, RRR. No JVD, murmurs, rubs, gallops or clicks. No pedal edema. Gastrointestinal system: Abdomen is nondistended, soft and nontender. No organomegaly or masses felt. Normal bowel sounds heard. Central nervous system: Alert and oriented. No focal neurological deficits. Extremities: Symmetric 5 x 5 power. Skin: see pics below Psychiatry: Judgement and insight appear normal. Mood & affect appropriate.            Data Reviewed: I have personally reviewed following labs and imaging studies  CBC: Recent Labs  Lab 03/20/18 1931 03/21/18 0515  WBC 5.7 6.4  NEUTROABS 4.2  --   HGB 11.6* 10.7*  HCT 38.4* 35.1*  MCV 93.9 92.6  PLT 211 528   Basic Metabolic Panel: Recent Labs  Lab 03/20/18 1931 03/21/18 0515  NA 139 141  K  4.9 4.8  CL 99 102  CO2 25 23  GLUCOSE 114* 122*  BUN 82* 88*  CREATININE 11.37* 11.53*  CALCIUM 8.4* 8.3*  MG  --  2.1  PHOS  --  6.8*   GFR: Estimated Creatinine Clearance: 8.2 mL/min (A) (by C-G formula based on SCr of 11.53 mg/dL (H)). Liver Function Tests: No results for input(s): AST, ALT, ALKPHOS, BILITOT, PROT, ALBUMIN in the last 168 hours. No results for input(s): LIPASE, AMYLASE in the last 168 hours. No results for input(s): AMMONIA in the last 168 hours. Coagulation Profile: Recent Labs  Lab 03/20/18 1933 03/21/18 0515  INR 3.79 4.52*    Cardiac Enzymes: No results for input(s): CKTOTAL, CKMB, CKMBINDEX, TROPONINI in the last 168 hours. BNP (last 3 results) No results for input(s): PROBNP in the last 8760 hours. HbA1C: No results for input(s): HGBA1C in the last 72 hours. CBG: Recent Labs  Lab 03/21/18 1149  GLUCAP 102*   Lipid Profile: No results for input(s): CHOL, HDL, LDLCALC, TRIG, CHOLHDL, LDLDIRECT in the last 72 hours. Thyroid Function Tests: No results for input(s): TSH, T4TOTAL, FREET4, T3FREE, THYROIDAB in the last 72 hours. Anemia Panel: No results for input(s): VITAMINB12, FOLATE, FERRITIN, TIBC, IRON, RETICCTPCT in the last 72 hours. Sepsis Labs: No results for input(s): PROCALCITON, LATICACIDVEN in the last 168 hours.  No results found for this or any previous visit (from the past 240 hour(s)).       Radiology Studies: Dg Ankle 2 Views Left  Result Date: 03/20/2018 CLINICAL DATA:  Pain. EXAM: LEFT ANKLE - 2 VIEW COMPARISON:  None. FINDINGS: There is no evidence of fracture, dislocation, or joint effusion. There is no evidence of arthropathy or other focal bone abnormality. Soft tissues are unremarkable. IMPRESSION: Negative. Electronically Signed   By: Misty Stanley M.D.   On: 03/20/2018 20:14   Dg Foot Complete Left  Result Date: 03/20/2018 CLINICAL DATA:  Soft tissue wound at second toe and posterior heel. EXAM: LEFT FOOT - COMPLETE 3+ VIEW COMPARISON:  None. FINDINGS: No evidence for an acute fracture. No subluxation or dislocation. No bony destruction evident in the second toe or calcaneus to suggest overt osteomyelitis. No gas evident within the soft tissues. IMPRESSION: Negative. Electronically Signed   By: Misty Stanley M.D.   On: 03/20/2018 20:13   Dg Foot Complete Right  Result Date: 03/20/2018 CLINICAL DATA:  Left foot pain EXAM: RIGHT FOOT COMPLETE - 3+ VIEW COMPARISON:  None. FINDINGS: Severe diffuse vascular calcifications. Plantar calcaneal spur. No acute bony abnormality.  Specifically, no fracture, subluxation, or dislocation. IMPRESSION: No acute bony abnormality. Electronically Signed   By: Rolm Baptise M.D.   On: 03/20/2018 20:13        Scheduled Meds: . allopurinol  100 mg Oral Daily  . carvedilol  12.5 mg Oral BID WC  . Chlorhexidine Gluconate Cloth  6 each Topical Q0600  . cinacalcet  30 mg Oral Q supper  . diltiazem  180 mg Oral Daily  . docusate sodium  100 mg Oral Daily  . insulin aspart  0-9 Units Subcutaneous TID WC  . pantoprazole  40 mg Oral Daily  . pravastatin  5 mg Oral QPM  . sevelamer carbonate  1,600 mg Oral With snacks  . sevelamer carbonate  3,200 mg Oral TID WC   Continuous Infusions: . sodium chloride    . sodium chloride    .  ceFAZolin (ANCEF) IV Stopped (03/20/18 2243)     LOS: 1 day  Time spent: 43mins    Kathie Dike, MD [facility]   If 7PM-7AM, please contact night-coverage [email or web address]  03/21/2018, 3:36 PM

## 2018-03-21 NOTE — Consult Note (Signed)
Reason for Consult: Continuity of ESRD care Referring Physician: Kathie Dike M.D.([facility])  HPI:  63 year old African-American man with past medical history significant for hypertension, atrial fibrillation on chronic anticoagulation with Coumadin, type 2 diabetes mellitus, nonischemic cardiomyopathy, prior history of renal cell carcinoma status post nephrectomy with end-stage renal disease on hemodialysis on a TTS schedule under the care of Dr. Lowanda Foster.  Presents to the hospital with increasing pain/tenderness of the left foot/heel with associated redness.  Clinically consistent with cellulitis of the left foot with concomitant dry gangrene/ischemic changes of the tip of the third toe of the left foot.  He also has dry gangrene of the third toe (entire) of the right foot that he says is under the care of his primary care provider.  He denies any fever but reports occasional chills and some generalized malaise.  Denies any chest pain or shortness of breath.  Denies any nausea, vomiting or diarrhea.  Hemodialysis prescription: TTS, 4 hours 15 minutes, [facility].  Dry weight 97 kg.  Left brachiocephalic fistula.  Past Medical History:  Diagnosis Date  . Anemia   . Atrial fibrillation ([place])   . Cardiomyopathy    LVEF 50-55% 8/11 - [facility]  . Colonic polyp   . Diverticulosis of colon   . ESRD on hemodialysis ([place])    Dr. Lowanda Foster [facility]  . Essential hypertension, benign   . Gout   . Mixed hyperlipidemia   . Neuropathy    in both feet  . OSA (obstructive sleep apnea)    CPAP  . Renal cell cancer ([place])   . Type 2 diabetes mellitus ([place])    Type II    Past Surgical History:  Procedure Laterality Date  . BIOPSY  01/05/2017   Procedure: BIOPSY;  Surgeon: Daneil Dolin, MD;  Location: [facility];  Service: Endoscopy;;  gastric  . COLONOSCOPY N/A 01/07/2016   Dr. [name]: Grade 1 internal hemorrhoids, otherwise normal  . COLONOSCOPY W/ POLYPECTOMY  2009, 2012   2009: Dr. [name]:  1.25 cm adenomatous polyp without high grade dysplasia, distal to IC valve. 2012: normal rectum, few pancolonic diverticula, single diminutive hyperplastic polyp  . ESOPHAGOGASTRODUODENOSCOPY  2009   Dr. [name]: accentuated undulating Z-lin with couple of islands of salmon-colored epithelium suspicious for Barrett's, s/p biopsy with path revealing mild inflammation, no metaplasia, dysplasia, or malignancy  . ESOPHAGOGASTRODUODENOSCOPY N/A 01/05/2017   Procedure: ESOPHAGOGASTRODUODENOSCOPY (EGD);  Surgeon: Daneil Dolin, MD;  Location: [facility];  Service: Endoscopy;  Laterality: N/A;  . EUS N/A 04/21/2017   Procedure: UPPER ENDOSCOPIC ULTRASOUND (EUS) LINEAR;  Surgeon: Milus Banister, MD;  Location: [facility];  Service: Endoscopy;  Laterality: N/A;  . FISTULA SUPERFICIALIZATION Left 03/31/2015   Procedure: CEPHALIC VEIN TURNDOWN; PLICATION OF BRACHIOCEPHALIC AVF;  Surgeon: Conrad McRoberts, MD;  Location: [facility];  Service: Vascular;  Laterality: Left;  . FISTULOGRAM N/A 10/19/2013   Procedure: FISTULOGRAM;  Surgeon: Rosetta Posner, MD;  Location: [facility] CATH LAB;  Service: Cardiovascular;  Laterality: N/A;  . Left arm AV fistula  2009   Dr. [name]  . PERIPHERAL VASCULAR CATHETERIZATION N/A 03/10/2015   Procedure: Fistulagram;  Surgeon: Conrad Philipsburg, MD;  Location: [facility] CV LAB;  Service: Cardiovascular;  Laterality: N/A;  . Right nephrectomy      Family History  Problem Relation Age of Onset  . Hypertension Mother   . Hypertension Other        Siblings  . Diabetes type II Other  Siblings  . Colon cancer Neg Hx     Social History:  reports that he quit smoking about 19 years ago. His smoking use included cigarettes. He has never used smokeless tobacco. He reports that he does not drink alcohol or use drugs.  Allergies:  Allergies  Allergen Reactions  . Metformin And Related Other (See Comments)    Diarrhea, bloody stool, upset stomach.     Medications:  Scheduled: .  allopurinol  100 mg Oral Daily  . carvedilol  12.5 mg Oral BID WC  . cinacalcet  30 mg Oral Q supper  . diltiazem  180 mg Oral Daily  . docusate sodium  100 mg Oral Daily  . insulin aspart  0-9 Units Subcutaneous TID WC  . pantoprazole  40 mg Oral Daily  . pravastatin  5 mg Oral QPM  . sevelamer carbonate  1,600 mg Oral With snacks  . sevelamer carbonate  3,200 mg Oral TID WC   BMP Latest Ref Rng & Units 03/21/2018 03/20/2018 04/11/2017  Glucose 70 - 99 mg/dL 122(H) 114(H) -  BUN 8 - 23 mg/dL 88(H) 82(H) -  Creatinine 0.61 - 1.24 mg/dL 11.53(H) 11.37(H) 7.60(H)  Sodium 135 - 145 mmol/L 141 139 -  Potassium 3.5 - 5.1 mmol/L 4.8 4.9 -  Chloride 98 - 111 mmol/L 102 99 -  CO2 22 - 32 mmol/L 23 25 -  Calcium 8.9 - 10.3 mg/dL 8.3(L) 8.4(L) -   CBC Latest Ref Rng & Units 03/21/2018 03/20/2018 01/22/2017  WBC 4.0 - 10.5 K/uL 6.4 5.7 -  Hemoglobin 13.0 - 17.0 g/dL 10.7(L) 11.6(L) 8.0(L)  Hematocrit 39.0 - 52.0 % 35.1(L) 38.4(L) 25.2(L)  Platelets 150 - 400 K/uL 207 211 -    Review of Systems  Constitutional: Positive for chills and malaise/fatigue. Negative for fever.  HENT: Negative.   Eyes: Negative.   Respiratory: Negative.   Cardiovascular: Negative.   Gastrointestinal: Negative.   Genitourinary: Negative.   Musculoskeletal:       See HPI  Skin: Negative.   Neurological: Negative.    Blood pressure 128/83, pulse (!) 103, temperature 98.3 F (36.8 C), resp. rate 20, height 6\' 2"  (1.88 m), weight 99.8 kg, SpO2 96 %. Physical Exam  Nursing note and vitals reviewed. Constitutional: He is oriented to person, place, and time. He appears well-developed and well-nourished. No distress.  Fetid odor around patient  HENT:  Head: Normocephalic and atraumatic.  Mouth/Throat: Oropharynx is clear and moist.  Eyes: Pupils are equal, round, and reactive to light. Conjunctivae and EOM are normal. No scleral icterus.  Neck: Normal range of motion. Neck supple. No JVD present.  Cardiovascular:  Normal rate.  No murmur heard. Regular tachycardia  Respiratory: Effort normal and breath sounds normal. He has no wheezes. He has no rales.  GI: Soft. Bowel sounds are normal. There is no abdominal tenderness. There is no rebound and no guarding.  Musculoskeletal:        General: No edema.     Comments: Left brachiocephalic fistula, pulsatile. Left foot with erythema in the forefoot area and gangrenous tip of third toe.  Right foot with entirely gangrenous third toe.  Neurological: He is alert and oriented to person, place, and time.  Skin: Skin is warm and dry.    Assessment/Plan: 1.  Cellulitis left lower extremity: He appears to have significant peripheral vascular disease based on findings of ischemic/gangrenous digits which I suspect is contributing to current presentation.  On intravenous cefazolin and blood cultures pending.  PRN pain management per primary service. 2.  End-stage renal disease: We will order for hemodialysis today to continue TTS schedule.  He appears to be close to his euvolemic status and without any critical electrolyte abnormalities. 3.  Peripheral vascular disease: With necrotic/ischemic appearing third toes bilaterally-needs arteriogram/evaluation by vascular surgery for additional management that is likely to include amputations. 4.  Hypertension: Blood pressure under fair control, monitor with hemodialysis/ultrafiltration. 5.  Anemia of chronic kidney disease: Suspect significant ESA resistance with ongoing infection/PAD/gangrenous digit. 6.  Metabolic bone disease: Will restart phosphorus binder and reconcile medications for VDR A.  Khalise Billard K. 03/21/2018, 9:38 AM

## 2018-03-21 NOTE — ED Notes (Signed)
Pt has dark black dried out second to great toe on the right foot. Patient's left foot is red with swelling and second to great toe is discolored at nailbed. Pt states both his left and right second to great toe have had both nails fall off of toe.

## 2018-03-22 LAB — PROTIME-INR
INR: 4.46
Prothrombin Time: 41.7 seconds — ABNORMAL HIGH (ref 11.4–15.2)

## 2018-03-22 LAB — CBC
HCT: 36.9 % — ABNORMAL LOW (ref 39.0–52.0)
Hemoglobin: 11.3 g/dL — ABNORMAL LOW (ref 13.0–17.0)
MCH: 27.9 pg (ref 26.0–34.0)
MCHC: 30.6 g/dL (ref 30.0–36.0)
MCV: 91.1 fL (ref 80.0–100.0)
PLATELETS: 220 10*3/uL (ref 150–400)
RBC: 4.05 MIL/uL — ABNORMAL LOW (ref 4.22–5.81)
RDW: 14.7 % (ref 11.5–15.5)
WBC: 6.6 10*3/uL (ref 4.0–10.5)
nRBC: 0 % (ref 0.0–0.2)

## 2018-03-22 LAB — HIV ANTIBODY (ROUTINE TESTING W REFLEX): HIV Screen 4th Generation wRfx: NONREACTIVE

## 2018-03-22 LAB — BASIC METABOLIC PANEL
Anion gap: 14 (ref 5–15)
BUN: 47 mg/dL — AB (ref 8–23)
CO2: 29 mmol/L (ref 22–32)
CREATININE: 8.03 mg/dL — AB (ref 0.61–1.24)
Calcium: 8.4 mg/dL — ABNORMAL LOW (ref 8.9–10.3)
Chloride: 93 mmol/L — ABNORMAL LOW (ref 98–111)
GFR calc Af Amer: 7 mL/min — ABNORMAL LOW (ref >60)
GFR calc non Af Amer: 6 mL/min — ABNORMAL LOW (ref >60)
Glucose, Bld: 126 mg/dL — ABNORMAL HIGH (ref 70–99)
Potassium: 4.3 mmol/L (ref 3.5–5.1)
Sodium: 136 mmol/L (ref 135–145)

## 2018-03-22 LAB — GLUCOSE, CAPILLARY
Glucose-Capillary: 96 mg/dL (ref 70–99)
Glucose-Capillary: 178 mg/dL — ABNORMAL HIGH (ref 70–99)
Glucose-Capillary: 141 mg/dL — ABNORMAL HIGH (ref 70–99)
Glucose-Capillary: 115 mg/dL — ABNORMAL HIGH (ref 70–99)

## 2018-03-22 LAB — MRSA PCR SCREENING: MRSA by PCR: NEGATIVE

## 2018-03-22 MED ORDER — POVIDONE-IODINE 10 % EX SOLN
CUTANEOUS | Status: AC
  Administered 2018-03-22: 15:00:00
  Filled 2018-03-22: qty 118

## 2018-03-22 NOTE — Consult Note (Signed)
WOC consulted for toe wound, left 2nd toe necrotic.   Reviewed chart, vascular work up in place. May transfer to [facility].  Reviewed images, will update orders for stable necrotic foot wounds on the left and right foot. Until vascular issues addressed best practice would be to leave the wounds stable and dry if possible. Will ask bedside nurses to paint affected areas with betadine and allow to air dry. No topical cover dressing needed.   Re consult if needed, will not follow at this time. Thanks  Justin Buechner [employer], RN,CWOCN, CNS, [place] [phone])

## 2018-03-22 NOTE — Progress Notes (Signed)
Patient ID: Albert [name], male   DOB: 1955/08/23, 63 y.o.   MRN: [id number]  [facility] Progress Note   Assessment/ Plan:   1.  Cellulitis left lower extremity: With no evidence of osteomyelitis seen on earlier x-rays, ongoing intravenous antibiotic therapy with Ancef and plans noted for transfer to [facility] for arteriogram and surgical management of gangrenous toes. 2.  End-stage renal disease:  Underwent hemodialysis yesterday without problems, will continue this on a TTS schedule. 3.  Peripheral vascular disease: With necrotic/ischemic appearing third toes bilaterally-ankle-brachial indicis yesterday show significant arterial occlusive disease and plans in place for transfer to [facility] for arteriogram/intervention by vascular surgery. 4.  Hypertension: Blood pressure under fair control, monitor with hemodialysis/ultrafiltration. 5.  Anemia of chronic kidney disease: Hemoglobin/hematocrit within acceptable range.  No overt loss.  Currently off ESA. 6.  Metabolic bone disease: We will continue to monitor phosphorus on sevelamer, continue cinacalcet and Hectorol for PTH suppression.  Subjective:   Reports to be feeling somewhat better with regards to discomfort in his leg and understanding plan in place for possible transfer.   Objective:   BP 100/64   Pulse (!) 106   Temp 98.9 F (37.2 C) (Oral)   Resp 20   Ht 6\' 2"  (1.88 m)   Wt 96.1 kg   SpO2 97%   BMI 27.20 kg/m   Physical Exam: Gen: Comfortably resting in bed CVS: Pulse regular tachycardia, S1 and S2 normal Resp: Clear to auscultation bilaterally, no distinct rales or rhonchi Abd: Soft, flat, nontender Ext: Left foot erythema noted, left foot tip of third toe gangrenous and right foot entire third toe gangrenous.  Left BCF.  Labs: BMET Recent Labs  Lab 03/20/18 1931 03/21/18 0515  NA 139 141  K 4.9 4.8  CL 99 102  CO2 25 23  GLUCOSE 114* 122*  BUN 82* 88*  CREATININE 11.37* 11.53*   CALCIUM 8.4* 8.3*  PHOS  --  6.8*   CBC Recent Labs  Lab 03/20/18 1931 03/21/18 0515  WBC 5.7 6.4  NEUTROABS 4.2  --   HGB 11.6* 10.7*  HCT 38.4* 35.1*  MCV 93.9 92.6  PLT 211 207   Medications:    . allopurinol  100 mg Oral Daily  . carvedilol  12.5 mg Oral BID WC  . Chlorhexidine Gluconate Cloth  6 each Topical Q0600  . cinacalcet  30 mg Oral Q supper  . diltiazem  180 mg Oral Daily  . docusate sodium  100 mg Oral Daily  . doxercalciferol  3.5 mcg Intravenous Q T,Th,Sa-HD  . insulin aspart  0-9 Units Subcutaneous TID WC  . pantoprazole  40 mg Oral Daily  . pravastatin  5 mg Oral QPM  . sevelamer carbonate  1,600 mg Oral With snacks  . sevelamer carbonate  3,200 mg Oral TID WC   Elmarie Shiley, MD 03/22/2018, 9:00 AM

## 2018-03-22 NOTE — Progress Notes (Signed)
[facility] admit informed of patient arrival to [id number] via test page

## 2018-03-22 NOTE — Progress Notes (Signed)
PROGRESS NOTE    Albert [name]  FIE:[id number] DOB: 1956/01/27 DOA: 03/20/2018 PCP: Redmond School, MD    Brief Narrative:  63 y/o male with ESRD on HD, A fib on coumadin, admitted to the hospital with left heel cellulitis. He is also noted to have necrotic/dry gangrene of 2nd toe on right foot.  ABIs performed showed compromised blood flow bilaterally.  Plan is to transfer to [facility] for vascular surgery evaluation and likely arteriogram.    Assessment & Plan:   Principal Problem:   Cellulitis of left lower extremity Active Problems:   Essential hypertension, benign   Atrial fibrillation (HCC)   ESRD (end stage renal disease) (HCC)   Supratherapeutic INR   1. Cellulitis of left heel. Currently on ancef. Overall redness improving. No fevers. Continue current treatment 2. Dry gangrene of right second toe. Pulses are difficult to palpate, but foot is warm.  ABIs performed noted that vessels at ankle did not compress bilaterally.  Discussed with Dr. [name] on-call for vascular surgery who felt the patient would need to be transferred to [facility] for arteriogram.  INR is still elevated, but appears to have plateaued and will likely trend down over the next day or 2.  This will likely coincide with availability to perform arteriogram.  This will need to be coordinated with patient's dialysis. 3. ESRD on HD. Nephrology following.  Last dialysis 1/28 4. A fib, permanent. Heart rate stable.  Continue Coreg and diltiazem for rate control.  He is chronically on coumadin with supratherapeutic INR. Will hold further coumadin and let INR drift down.  Continue to monitor on telemetry 5. Diabetes type 2, blood sugars have been stable. Oral glipizide and tradjenta currently on hold. Continue sliding scale insulin  DVT prophylaxis: coumadin, currently on hold Code Status: full code Family Communication: discussed with patient Disposition Plan: Transfer to [facility] for vascular surgery  evaluation and arrangement of arteriogram   Consultants:   Nephrology  Procedures:     Antimicrobials:   Ancef 1/27>    Subjective: Continues to complain of pain in left foot around heel. Also has pain at base of toes on right foot.  Objective: Vitals:   03/21/18 2015 03/21/18 2024 03/21/18 2035 03/22/18 0505  BP: (!) 118/53  (!) 120/50 100/64  Pulse: 97  94 (!) 106  Resp:   18 20  Temp:   97.9 F (36.6 C) 98.9 F (37.2 C)  TempSrc:   Oral Oral  SpO2:  97%  97%  Weight:    96.1 kg  Height:        Intake/Output Summary (Last 24 hours) at 03/22/2018 1258 Last data filed at 03/21/2018 2015 Gross per 24 hour  Intake -  Output 2130 ml  Net -2130 ml   Filed Weights   03/20/18 1630 03/21/18 1545 03/22/18 0505  Weight: 99.8 kg 98.5 kg 96.1 kg    Examination:  General exam: Alert, awake, oriented x 3 Respiratory system: Clear to auscultation. Respiratory effort normal. Cardiovascular system:RRR. No murmurs, rubs, gallops. Gastrointestinal system: Abdomen is nondistended, soft and nontender. No organomegaly or masses felt. Normal bowel sounds heard. Central nervous system: Alert and oriented. No focal neurological deficits. Extremities: edema in feet bilaterally Skin: right foot second toe necrosis unchanged, erythema over left heel slowly improving, still tender to touch Psychiatry: Judgement and insight appear normal. Mood & affect appropriate.   Data Reviewed: I have personally reviewed following labs and imaging studies  CBC: Recent Labs  Lab 03/20/18 1931  03/21/18 0515 03/22/18 0905  WBC 5.7 6.4 6.6  NEUTROABS 4.2  --   --   HGB 11.6* 10.7* 11.3*  HCT 38.4* 35.1* 36.9*  MCV 93.9 92.6 91.1  PLT 211 207 409   Basic Metabolic Panel: Recent Labs  Lab 03/20/18 1931 03/21/18 0515 03/22/18 0905  NA 139 141 136  K 4.9 4.8 4.3  CL 99 102 93*  CO2 25 23 29   GLUCOSE 114* 122* 126*  BUN 82* 88* 47*  CREATININE 11.37* 11.53* 8.03*  CALCIUM 8.4* 8.3*  8.4*  MG  --  2.1  --   PHOS  --  6.8*  --    GFR: Estimated Creatinine Clearance: 10.8 mL/min (A) (by C-G formula based on SCr of 8.03 mg/dL (H)). Liver Function Tests: No results for input(s): AST, ALT, ALKPHOS, BILITOT, PROT, ALBUMIN in the last 168 hours. No results for input(s): LIPASE, AMYLASE in the last 168 hours. No results for input(s): AMMONIA in the last 168 hours. Coagulation Profile: Recent Labs  Lab 03/20/18 1933 03/21/18 0515 03/22/18 0905  INR 3.79 4.52* 4.46*   Cardiac Enzymes: No results for input(s): CKTOTAL, CKMB, CKMBINDEX, TROPONINI in the last 168 hours. BNP (last 3 results) No results for input(s): PROBNP in the last 8760 hours. HbA1C: No results for input(s): HGBA1C in the last 72 hours. CBG: Recent Labs  Lab 03/21/18 1149 03/21/18 1638 03/21/18 2109 03/22/18 0833 03/22/18 1202  GLUCAP 102* 88 86 96 178*   Lipid Profile: No results for input(s): CHOL, HDL, LDLCALC, TRIG, CHOLHDL, LDLDIRECT in the last 72 hours. Thyroid Function Tests: No results for input(s): TSH, T4TOTAL, FREET4, T3FREE, THYROIDAB in the last 72 hours. Anemia Panel: No results for input(s): VITAMINB12, FOLATE, FERRITIN, TIBC, IRON, RETICCTPCT in the last 72 hours. Sepsis Labs: No results for input(s): PROCALCITON, LATICACIDVEN in the last 168 hours.  Recent Results (from the past 240 hour(s))  MRSA PCR Screening     Status: None   Collection Time: 03/22/18  6:13 AM  Result Value Ref Range Status   MRSA by PCR NEGATIVE NEGATIVE Final    Comment:        The GeneXpert MRSA Assay (FDA approved for NASAL specimens only), is one component of a comprehensive MRSA colonization surveillance program. It is not intended to diagnose MRSA infection nor to guide or monitor treatment for MRSA infections. Performed at [facility], [address] [place], [place] [address]          Radiology Studies: Dg Ankle 2 Views Left  Result Date: 03/20/2018 CLINICAL DATA:  Pain.  EXAM: LEFT ANKLE - 2 VIEW COMPARISON:  None. FINDINGS: There is no evidence of fracture, dislocation, or joint effusion. There is no evidence of arthropathy or other focal bone abnormality. Soft tissues are unremarkable. IMPRESSION: Negative. Electronically Signed   By: Misty Stanley M.D.   On: 03/20/2018 20:14   US Arterial Abi (screening Lower Extremity)  Result Date: 03/21/2018 CLINICAL DATA:  Gangrene in toes bilaterally EXAM: NONINVASIVE PHYSIOLOGIC VASCULAR STUDY OF BILATERAL LOWER EXTREMITIES TECHNIQUE: Evaluation of both lower extremities were performed at rest, including calculation of ankle-brachial indices with single level Doppler, pressure and pulse volume recording. COMPARISON:  None. FINDINGS: Right ABI:  Noncompressible vessels at the ankle. Left ABI:  Noncompressible vessels at the ankle. Right Lower Extremity: Monophasic posterior tibial and dorsalis pedis waveforms. Left Lower Extremity: The posterior tibial Doppler waveform is absent. The dorsalis pedis waveform is monophasic. IMPRESSION: There is severe bilateral lower extremity arterial occlusive disease.  Electronically Signed   By: Marybelle Killings M.D.   On: 03/21/2018 16:27   Dg Foot Complete Left  Result Date: 03/20/2018 CLINICAL DATA:  Soft tissue wound at second toe and posterior heel. EXAM: LEFT FOOT - COMPLETE 3+ VIEW COMPARISON:  None. FINDINGS: No evidence for an acute fracture. No subluxation or dislocation. No bony destruction evident in the second toe or calcaneus to suggest overt osteomyelitis. No gas evident within the soft tissues. IMPRESSION: Negative. Electronically Signed   By: Misty Stanley M.D.   On: 03/20/2018 20:13   Dg Foot Complete Right  Result Date: 03/20/2018 CLINICAL DATA:  Left foot pain EXAM: RIGHT FOOT COMPLETE - 3+ VIEW COMPARISON:  None. FINDINGS: Severe diffuse vascular calcifications. Plantar calcaneal spur. No acute bony abnormality. Specifically, no fracture, subluxation, or dislocation. IMPRESSION:  No acute bony abnormality. Electronically Signed   By: Rolm Baptise M.D.   On: 03/20/2018 20:13        Scheduled Meds: . allopurinol  100 mg Oral Daily  . carvedilol  12.5 mg Oral BID WC  . Chlorhexidine Gluconate Cloth  6 each Topical Q0600  . cinacalcet  30 mg Oral Q supper  . diltiazem  180 mg Oral Daily  . docusate sodium  100 mg Oral Daily  . doxercalciferol  3.5 mcg Intravenous Q T,Th,Sa-HD  . insulin aspart  0-9 Units Subcutaneous TID WC  . pantoprazole  40 mg Oral Daily  . pravastatin  5 mg Oral QPM  . sevelamer carbonate  1,600 mg Oral With snacks  . sevelamer carbonate  3,200 mg Oral TID WC   Continuous Infusions: . sodium chloride    . sodium chloride    .  ceFAZolin (ANCEF) IV 1 g (03/21/18 2117)     LOS: 2 days    Time spent: 7mins    Kathie Dike, MD [facility]   If 7PM-7AM, please contact night-coverage [email or web address]  03/22/2018, 12:58 PM

## 2018-03-23 ENCOUNTER — Telehealth (HOSPITAL_COMMUNITY): Payer: Self-pay | Admitting: Internal Medicine

## 2018-03-23 ENCOUNTER — Encounter

## 2018-03-23 DIAGNOSIS — N16 Renal tubulo-interstitial disorders in diseases classified elsewhere: Secondary | ICD-10-CM

## 2018-03-23 DIAGNOSIS — I4821 Permanent atrial fibrillation: Secondary | ICD-10-CM

## 2018-03-23 DIAGNOSIS — I96 Gangrene, not elsewhere classified: Secondary | ICD-10-CM

## 2018-03-23 DIAGNOSIS — I10 Essential (primary) hypertension: Secondary | ICD-10-CM

## 2018-03-23 DIAGNOSIS — Z992 Dependence on renal dialysis: Secondary | ICD-10-CM

## 2018-03-23 LAB — GLUCOSE, CAPILLARY
GLUCOSE-CAPILLARY: 155 mg/dL — AB (ref 70–99)
Glucose-Capillary: 128 mg/dL — ABNORMAL HIGH (ref 70–99)
Glucose-Capillary: 154 mg/dL — ABNORMAL HIGH (ref 70–99)
Glucose-Capillary: 116 mg/dL — ABNORMAL HIGH (ref 70–99)

## 2018-03-23 LAB — BASIC METABOLIC PANEL
Anion gap: 16 — ABNORMAL HIGH (ref 5–15)
BUN: 62 mg/dL — ABNORMAL HIGH (ref 8–23)
CHLORIDE: 93 mmol/L — AB (ref 98–111)
CO2: 26 mmol/L (ref 22–32)
Calcium: 8.5 mg/dL — ABNORMAL LOW (ref 8.9–10.3)
Creatinine, Ser: 10.37 mg/dL — ABNORMAL HIGH (ref 0.61–1.24)
GFR calc Af Amer: 5 mL/min — ABNORMAL LOW (ref >60)
GFR calc non Af Amer: 5 mL/min — ABNORMAL LOW (ref >60)
Glucose, Bld: 128 mg/dL — ABNORMAL HIGH (ref 70–99)
Potassium: 5.3 mmol/L — ABNORMAL HIGH (ref 3.5–5.1)
Sodium: 135 mmol/L (ref 135–145)

## 2018-03-23 LAB — CBC
HCT: 36.6 % — ABNORMAL LOW (ref 39.0–52.0)
Hemoglobin: 11.3 g/dL — ABNORMAL LOW (ref 13.0–17.0)
MCH: 27.8 pg (ref 26.0–34.0)
MCHC: 30.9 g/dL (ref 30.0–36.0)
MCV: 90.1 fL (ref 80.0–100.0)
NRBC: 0 % (ref 0.0–0.2)
PLATELETS: 201 10*3/uL (ref 150–400)
RBC: 4.06 MIL/uL — ABNORMAL LOW (ref 4.22–5.81)
RDW: 14.6 % (ref 11.5–15.5)
WBC: 7 10*3/uL (ref 4.0–10.5)

## 2018-03-23 LAB — PROTIME-INR
INR: 3.94
PROTHROMBIN TIME: 37.9 s — AB (ref 11.4–15.2)

## 2018-03-23 MED ORDER — LIDOCAINE-PRILOCAINE 2.5-2.5 % EX CREA: 1.0000 "application " | TOPICAL_CREAM | CUTANEOUS | Status: DC | PRN

## 2018-03-23 MED ORDER — HEPARIN SODIUM (PORCINE) 1000 UNIT/ML DIALYSIS: 1000.0000 [IU] | INTRAMUSCULAR | Status: DC | PRN

## 2018-03-23 MED ORDER — PENTAFLUOROPROP-TETRAFLUOROETH EX AERO: 1.0000 "application " | INHALATION_SPRAY | CUTANEOUS | Status: DC | PRN

## 2018-03-23 MED ORDER — CHLORHEXIDINE GLUCONATE CLOTH 2 % EX PADS: 6.0000 | MEDICATED_PAD | Freq: Every day | CUTANEOUS | Status: DC

## 2018-03-23 MED ORDER — ALTEPLASE 2 MG IJ SOLR: 2.0000 mg | Freq: Once | INTRAMUSCULAR | Status: DC | PRN

## 2018-03-23 MED ORDER — SODIUM CHLORIDE 0.9 % IV SOLN: 100.0000 mL | INTRAVENOUS | Status: DC | PRN

## 2018-03-23 MED ORDER — LIDOCAINE HCL (PF) 1 % IJ SOLN: 5.0000 mL | INTRAMUSCULAR | Status: DC | PRN

## 2018-03-23 MED ORDER — DOXERCALCIFEROL 4 MCG/2ML IV SOLN
INTRAVENOUS | Status: AC
  Administered 2018-03-23: 3.5 ug via INTRAVENOUS
  Filled 2018-03-23: qty 2

## 2018-03-23 MED FILL — Morphine Sulfate Inj 4 MG/ML: INTRAMUSCULAR | Qty: 1 | Status: AC

## 2018-03-23 NOTE — Progress Notes (Signed)
[facility] PROGRESS NOTE    Progress Note  Albert [name]  ZHY:[id number] DOB: 1955/07/02 DOA: 03/20/2018 PCP: Redmond School, MD     Brief Narrative:   Albert [name] is an 63 y.o. male past medical history of end-stage renal disease on dialysis, atrial fibrillation on Coumadin comes in with left heel cellulitis and necrotic second great right toe ABIs performed that showed compromised blood flow bilaterally  Assessment/Plan:   Cellulitis of left lower extremity/dry gangrenous right second toe severe bilateral peripheral vascular disease: ABI's show severe bilateral extremity arterial occlusive disease. Heel x-ray showed no osteomyelitis.  Only on IV Ancef Case was discussed with Dr. Doren Custard who recommended transfer to [facility] for arteriogram, his INR is elevated, vascular would like the INR to be lower before proceeding with arteriogram.  End renal disease on dialysis: Renal following for HD per renal.  He dialyzes Tuesday Thursdays and Saturdays.  Chronic atrial fibrillation: Heart rate is controlled on Coreg and diltiazem INR was supratherapeutic, we have been holding his Coumadin INR slowly drifting down.  Diabetes mellitus type II: Hold oral hypoglycemic agents, he is currently on sliding scale insulin    DVT prophylaxis: INR therapeutic Family Communication:none Disposition Plan/Barrier to D/C: unable to determine Code Status:     Code Status Orders  (From admission, onward)         Start     Ordered   03/20/18 2128  Full code  Continuous     03/20/18 2130        Code Status History    Date Active Date Inactive Code Status Order ID Comments User Context   01/21/2017 1655 01/22/2017 2006 Full Code [id number]  [name], MD Inpatient   01/03/2017 2148 01/06/2017 2047 Full Code [id number]  [name], MD Inpatient   10/12/2013 0341 10/20/2013 1428 Full Code [id number]  [name], MD Inpatient    Advance Directive Documentation    Most Recent Value  Type of Advance Directive  Living will  Pre-existing out of facility DNR order (yellow form or pink MOST form)  -  "MOST" Form in Place?  -        IV Access:    Peripheral IV   Procedures and diagnostic studies:   US Arterial Abi (screening Lower Extremity)  Result Date: 03/21/2018 CLINICAL DATA:  Gangrene in toes bilaterally EXAM: NONINVASIVE PHYSIOLOGIC VASCULAR STUDY OF BILATERAL LOWER EXTREMITIES TECHNIQUE: Evaluation of both lower extremities were performed at rest, including calculation of ankle-brachial indices with single level Doppler, pressure and pulse volume recording. COMPARISON:  None. FINDINGS: Right ABI:  Noncompressible vessels at the ankle. Left ABI:  Noncompressible vessels at the ankle. Right Lower Extremity: Monophasic posterior tibial and dorsalis pedis waveforms. Left Lower Extremity: The posterior tibial Doppler waveform is absent. The dorsalis pedis waveform is monophasic. IMPRESSION: There is severe bilateral lower extremity arterial occlusive disease. Electronically Signed   By: Marybelle Killings M.D.   On: 03/21/2018 16:27     Medical Consultants:    None.  Anti-Infectives:   IV ancef  Subjective:    Albert [name] relates feels fine no complaints.  Objective:    Vitals:   03/22/18 1417 03/22/18 1649 03/22/18 2016 03/23/18 0550  BP: (!) 91/51 107/78 98/70 121/79  Pulse: 100 97 98 (!) 121  Resp: 18 20 18 18   Temp: 98.6 F (37 C) 98.1 F (36.7 C) 97.8 F (36.6 C) 98.6 F (37 C)  TempSrc: Oral Oral Oral   SpO2: 93% 95%  97% 94%  Weight:      Height:        Intake/Output Summary (Last 24 hours) at 03/23/2018 1125 Last data filed at 03/23/2018 0856 Gross per 24 hour  Intake 320 ml  Output 0 ml  Net 320 ml   Filed Weights   03/20/18 1630 03/21/18 1545 03/22/18 0505  Weight: 99.8 kg 98.5 kg 96.1 kg    Exam: General exam: In no acute distress. Respiratory system: Good air movement and clear to  auscultation. Cardiovascular system: S1 & S2 heard, RRR. No JVD. Gastrointestinal system: Abdomen is nondistended, soft and nontender.  Central nervous system: Alert and oriented. No focal neurological deficits. Extremities: No pedal edema. Skin: No rashes, lesions or ulcers Psychiatry: Judgement and insight appear normal. Mood & affect appropriate.    Data Reviewed:    Labs: Basic Metabolic Panel: Recent Labs  Lab 03/20/18 1931 03/21/18 0515 03/22/18 0905 03/23/18 0438  NA 139 141 136 135  K 4.9 4.8 4.3 5.3*  CL 99 102 93* 93*  CO2 25 23 29 26   GLUCOSE 114* 122* 126* 128*  BUN 82* 88* 47* 62*  CREATININE 11.37* 11.53* 8.03* 10.37*  CALCIUM 8.4* 8.3* 8.4* 8.5*  MG  --  2.1  --   --   PHOS  --  6.8*  --   --    GFR Estimated Creatinine Clearance: 8.4 mL/min (A) (by C-G formula based on SCr of 10.37 mg/dL (H)). Liver Function Tests: No results for input(s): AST, ALT, ALKPHOS, BILITOT, PROT, ALBUMIN in the last 168 hours. No results for input(s): LIPASE, AMYLASE in the last 168 hours. No results for input(s): AMMONIA in the last 168 hours. Coagulation profile Recent Labs  Lab 03/20/18 1933 03/21/18 0515 03/22/18 0905 03/23/18 0438  INR 3.79 4.52* 4.46* 3.94    CBC: Recent Labs  Lab 03/20/18 1931 03/21/18 0515 03/22/18 0905 03/23/18 0438  WBC 5.7 6.4 6.6 7.0  NEUTROABS 4.2  --   --   --   HGB 11.6* 10.7* 11.3* 11.3*  HCT 38.4* 35.1* 36.9* 36.6*  MCV 93.9 92.6 91.1 90.1  PLT 211 207 220 201   Cardiac Enzymes: No results for input(s): CKTOTAL, CKMB, CKMBINDEX, TROPONINI in the last 168 hours. BNP (last 3 results) No results for input(s): PROBNP in the last 8760 hours. CBG: Recent Labs  Lab 03/22/18 1202 03/22/18 1643 03/22/18 2019 03/23/18 0729 03/23/18 1122  GLUCAP 178* 141* 115* 128* 155*   D-Dimer: No results for input(s): DDIMER in the last 72 hours. Hgb A1c: No results for input(s): HGBA1C in the last 72 hours. Lipid Profile: No results  for input(s): CHOL, HDL, LDLCALC, TRIG, CHOLHDL, LDLDIRECT in the last 72 hours. Thyroid function studies: No results for input(s): TSH, T4TOTAL, T3FREE, THYROIDAB in the last 72 hours.  Invalid input(s): FREET3 Anemia work up: No results for input(s): VITAMINB12, FOLATE, FERRITIN, TIBC, IRON, RETICCTPCT in the last 72 hours. Sepsis Labs: Recent Labs  Lab 03/20/18 1931 03/21/18 0515 03/22/18 0905 03/23/18 0438  WBC 5.7 6.4 6.6 7.0   Microbiology Recent Results (from the past 240 hour(s))  MRSA PCR Screening     Status: None   Collection Time: 03/22/18  6:13 AM  Result Value Ref Range Status   MRSA by PCR NEGATIVE NEGATIVE Final    Comment:        The GeneXpert MRSA Assay (FDA approved for NASAL specimens only), is one component of a comprehensive MRSA colonization surveillance program. It is not intended to diagnose  MRSA infection nor to guide or monitor treatment for MRSA infections. Performed at [facility], [address], [place], [place] [address]      Medications:   . allopurinol  100 mg Oral Daily  . carvedilol  12.5 mg Oral BID WC  . Chlorhexidine Gluconate Cloth  6 each Topical Q0600  . cinacalcet  30 mg Oral Q supper  . diltiazem  180 mg Oral Daily  . docusate sodium  100 mg Oral Daily  . doxercalciferol  3.5 mcg Intravenous Q T,Th,Sa-HD  . insulin aspart  0-9 Units Subcutaneous TID WC  . pantoprazole  40 mg Oral Daily  . pravastatin  5 mg Oral QPM  . sevelamer carbonate  1,600 mg Oral With snacks  . sevelamer carbonate  3,200 mg Oral TID WC   Continuous Infusions: . sodium chloride    . sodium chloride    .  ceFAZolin (ANCEF) IV 1 g (03/22/18 2350)      LOS: 3 days   Charlynne Cousins  [facility]  03/23/2018, 11:25 AM

## 2018-03-23 NOTE — Telephone Encounter (Signed)
03/23/18  pt cx said that he was going to have one toe and one leg amputated on Friday

## 2018-03-23 NOTE — Progress Notes (Signed)
[facility] ROUNDING NOTE   Subjective:   History of end-stage renal disease atrial fibrillation on Coumadin left heel cellulitis necrotic second great toe.  ABIs revealing bilateral extremity arterial occlusive disease.  Will need arteriogram as further diagnostic study.  He has been transferred from [facility] to [facility]    dialysis Tuesday Thursday Saturday  Blood pressure 121/79 pulse 120 temp 98.6 O2 sats 94% room air  Sodium 135 potassium 5.3 chloride 93 CO2 26 BUN 62 creatinine 10.37 glucose 128 WBC 7 hemoglobin 11.3 platelets 201   INR 3.94  Objective:  Vital signs in last 24 hours:  Temp:  [97.8 F (36.6 C)-98.6 F (37 C)] 98.6 F (37 C) (01/30 0550) Pulse Rate:  [97-121] 121 (01/30 0550) Resp:  [18-20] 18 (01/30 0550) BP: (91-121)/(51-79) 121/79 (01/30 0550) SpO2:  [93 %-97 %] 94 % (01/30 0550)  Weight change:  Filed Weights   03/20/18 1630 03/21/18 1545 03/22/18 0505  Weight: 99.8 kg 98.5 kg 96.1 kg    Intake/Output: I/O last 3 completed shifts: In: 200 [P.O.:100; IV Piggyback:100] Out: 2130 [Other:2130]   Intake/Output this shift:  Total I/O In: 340 [P.O.:340] Out: 0   Gen: Comfortably resting in bed CVS: Pulse regular tachycardia, S1 and S2 normal Resp:  Clear to auscultation no wheezes rales Abd:  Nontender nondistended Ext: Left foot erythema noted, left foot tip of third toe gangrenous and right foot entire third toe gangrenous.  Left BCF.   Basic Metabolic Panel: Recent Labs  Lab 03/20/18 1931 03/21/18 0515 03/22/18 0905 03/23/18 0438  NA 139 141 136 135  K 4.9 4.8 4.3 5.3*  CL 99 102 93* 93*  CO2 25 23 29 26   GLUCOSE 114* 122* 126* 128*  BUN 82* 88* 47* 62*  CREATININE 11.37* 11.53* 8.03* 10.37*  CALCIUM 8.4* 8.3* 8.4* 8.5*  MG  --  2.1  --   --   PHOS  --  6.8*  --   --     Liver Function Tests: No results for input(s): AST, ALT, ALKPHOS, BILITOT, PROT, ALBUMIN in the last 168 hours. No results for  input(s): LIPASE, AMYLASE in the last 168 hours. No results for input(s): AMMONIA in the last 168 hours.  CBC: Recent Labs  Lab 03/20/18 1931 03/21/18 0515 03/22/18 0905 03/23/18 0438  WBC 5.7 6.4 6.6 7.0  NEUTROABS 4.2  --   --   --   HGB 11.6* 10.7* 11.3* 11.3*  HCT 38.4* 35.1* 36.9* 36.6*  MCV 93.9 92.6 91.1 90.1  PLT 211 207 220 201    Cardiac Enzymes: No results for input(s): CKTOTAL, CKMB, CKMBINDEX, TROPONINI in the last 168 hours.  BNP: Invalid input(s): POCBNP  CBG: Recent Labs  Lab 03/22/18 1643 03/22/18 2019 03/23/18 0729 03/23/18 1122 03/23/18 1238  GLUCAP 141* 115* 128* 155* 154*    Microbiology: Results for orders placed or performed during the hospital encounter of 03/20/18  MRSA PCR Screening     Status: None   Collection Time: 03/22/18  6:13 AM  Result Value Ref Range Status   MRSA by PCR NEGATIVE NEGATIVE Final    Comment:        The GeneXpert MRSA Assay (FDA approved for NASAL specimens only), is one component of a comprehensive MRSA colonization surveillance program. It is not intended to diagnose MRSA infection nor to guide or monitor treatment for MRSA infections. Performed at [facility], [address] [place], [place] [address]     Coagulation Studies: Recent  Labs    03/20/18 1933 03/21/18 0515 03/22/18 0905 03/23/18 0438  LABPROT 36.8* 42.2* 41.7* 37.9*  INR 3.79 4.52* 4.46* 3.94    Urinalysis: No results for input(s): COLORURINE, LABSPEC, PHURINE, GLUCOSEU, HGBUR, BILIRUBINUR, KETONESUR, PROTEINUR, UROBILINOGEN, NITRITE, LEUKOCYTESUR in the last 72 hours.  Invalid input(s): APPERANCEUR    Imaging: US Arterial Abi (screening Lower Extremity)  Result Date: 03/21/2018 CLINICAL DATA:  Gangrene in toes bilaterally EXAM: NONINVASIVE PHYSIOLOGIC VASCULAR STUDY OF BILATERAL LOWER EXTREMITIES TECHNIQUE: Evaluation of both lower extremities were performed at rest, including calculation of ankle-brachial indices with single  level Doppler, pressure and pulse volume recording. COMPARISON:  None. FINDINGS: Right ABI:  Noncompressible vessels at the ankle. Left ABI:  Noncompressible vessels at the ankle. Right Lower Extremity: Monophasic posterior tibial and dorsalis pedis waveforms. Left Lower Extremity: The posterior tibial Doppler waveform is absent. The dorsalis pedis waveform is monophasic. IMPRESSION: There is severe bilateral lower extremity arterial occlusive disease. Electronically Signed   By: Marybelle Killings M.D.   On: 03/21/2018 16:27     Medications:   . sodium chloride    . sodium chloride    .  ceFAZolin (ANCEF) IV 1 g (03/22/18 2350)   . allopurinol  100 mg Oral Daily  . carvedilol  12.5 mg Oral BID WC  . Chlorhexidine Gluconate Cloth  6 each Topical Q0600  . cinacalcet  30 mg Oral Q supper  . diltiazem  180 mg Oral Daily  . docusate sodium  100 mg Oral Daily  . doxercalciferol  3.5 mcg Intravenous Q T,Th,Sa-HD  . insulin aspart  0-9 Units Subcutaneous TID WC  . pantoprazole  40 mg Oral Daily  . pravastatin  5 mg Oral QPM  . sevelamer carbonate  1,600 mg Oral With snacks  . sevelamer carbonate  3,200 mg Oral TID WC   sodium chloride, sodium chloride, heparin, HYDROcodone-acetaminophen, lidocaine (PF), lidocaine-prilocaine, pentafluoroprop-tetrafluoroeth  Assessment/ Plan:   End-stage renal disease with dialysis Tuesday Thursday Saturday schedule.  Followed [facility] Dr. Lowanda Foster.  Anemia stable no issue at this time  Bones continue Sevelamer 3.2 g with meals IV Hectorol 3.5 mcg  History of cardiomyopathy ejection fraction 50 to 55%  Atrial fibrillation continue diltiazem 180 mg daily, carvedilol 12.5 mg twice daily  Peripheral vascular disease with lower extremity cellulitis will need arteriogram continues Ancef 1 g daily  Hypertension/volume appears to be controlled   LOS: 3 Sherril Croon @TODAY @1 :17 PM

## 2018-03-23 NOTE — Progress Notes (Signed)
Pharmacy Antibiotic Note  Albert [name] is a 63 y.o. male admitted on 03/20/2018 with wound infection.  Pharmacy has been consulted for Cefazolin dosing.  The patient is ESRD - TTS, current dosing remains appropriate for now.   Plan: - Continue Cefazolin 1g IV every 24 hours - Will continue to follow HD schedule/duration, culture results, LOT, and antibiotic de-escalation plans   Height: 6\' 2"  (188 cm) Weight: 211 lb 13.8 oz (96.1 kg) IBW/kg (Calculated) : 82.2  Temp (24hrs), Avg:98.3 F (36.8 C), Min:97.8 F (36.6 C), Max:98.6 F (37 C)  Recent Labs  Lab 03/20/18 1931 03/21/18 0515 03/22/18 0905 03/23/18 0438  WBC 5.7 6.4 6.6 7.0  CREATININE 11.37* 11.53* 8.03* 10.37*    Estimated Creatinine Clearance: 8.4 mL/min (A) (by C-G formula based on SCr of 10.37 mg/dL (H)).    Allergies  Allergen Reactions  . Metformin And Related Other (See Comments)    Diarrhea, bloody stool, upset stomach.     Antimicrobials this admission: Cefazolin 1/27 >>     Microbiology results: 1/29 MRSA PCR >> neg  Thank you for allowing pharmacy to be a part of this patient's care.  Alycia Rossetti, PharmD, BCPS Clinical Pharmacist Clinical phone for 03/23/2018: [phone] 03/23/2018 10:56 AM   **Pharmacist phone directory can now be found on [email or web address] ([email or web address]).  Listed under [place].

## 2018-03-23 NOTE — Progress Notes (Signed)
Pt.'s HR has been sustaining in the 110s-130s since 2130. NP Blount notified. No new orders at this time.

## 2018-03-23 NOTE — Care Management Important Message (Signed)
Important Message  Patient Details  Name: Albert [name] MRN: [id number] Date of Birth: [birth year]   Medicare Important Message Given:  Yes    [name] 03/23/2018, 3:40 PM

## 2018-03-23 NOTE — Progress Notes (Signed)
Received pt from Randall Hiss, [place] HD tx initiated @ 1636 via 15G x 2  By Randall Hiss, RN w/o problem, per report Pull/push/flush well w/o problem, per report VSS, per report Will continue to monitor while on HD tx

## 2018-03-23 NOTE — Progress Notes (Signed)
HD tx completed @ 2036 w/o problem UF goal met Blood rinsed back VSS Report called to [place], RN

## 2018-03-23 NOTE — Consult Note (Signed)
Hospital Consult    Reason for Consult:  Dry gangrene of toes Referring Physician:  [facility] MRN #:  [id number]  History of Present Illness: This is a 63 y.o. male history of end-stage renal disease currently on dialysis via left arm fistula.  He also has diabetes, hypertension previous smoker.  States that he has had progressive changes to both second toes and also to his left heel.  These are now causing pain.  He went to [facility] for evaluation he is now transferred here for definitive management.  Denies any fevers or chills.  Takes Coumadin for atrial fibrillation.  Past Medical History:  Diagnosis Date  . Anemia   . Atrial fibrillation ([place])   . Cardiomyopathy    LVEF 50-55% 8/11 - [facility]  . Colonic polyp   . Diverticulosis of colon   . ESRD on hemodialysis ([place])    Dr. Lowanda Foster [facility]  . Essential hypertension, benign   . Gout   . Mixed hyperlipidemia   . Neuropathy    in both feet  . OSA (obstructive sleep apnea)    CPAP  . Renal cell cancer ([place])   . Type 2 diabetes mellitus ([place])    Type II    Past Surgical History:  Procedure Laterality Date  . BIOPSY  01/05/2017   Procedure: BIOPSY;  Surgeon: Daneil Dolin, MD;  [facility]: [facility];  Service: Endoscopy;;  gastric  . COLONOSCOPY N/A 01/07/2016   Dr. [name]: Grade 1 internal hemorrhoids, otherwise normal  . COLONOSCOPY W/ POLYPECTOMY  2009, 2012   2009: Dr. [name]: 1.25 cm adenomatous polyp without high grade dysplasia, distal to IC valve. 2012: normal rectum, few pancolonic diverticula, single diminutive hyperplastic polyp  . ESOPHAGOGASTRODUODENOSCOPY  2009   Dr. [name]: accentuated undulating Z-lin with couple of islands of salmon-colored epithelium suspicious for Barrett's, s/p biopsy with path revealing mild inflammation, no metaplasia, dysplasia, or malignancy  . ESOPHAGOGASTRODUODENOSCOPY N/A 01/05/2017   Procedure: ESOPHAGOGASTRODUODENOSCOPY (EGD);  Surgeon: Daneil Dolin, MD;  Location: [facility]  ENDO [facility];  Service: Endoscopy;  Laterality: N/A;  . EUS N/A 04/21/2017   Procedure: UPPER ENDOSCOPIC ULTRASOUND (EUS) LINEAR;  Surgeon: Milus Banister, MD;  Location: [facility] ENDOSCOPY;  Service: Endoscopy;  Laterality: N/A;  . FISTULA SUPERFICIALIZATION Left 03/31/2015   Procedure: CEPHALIC VEIN TURNDOWN; PLICATION OF BRACHIOCEPHALIC AVF;  Surgeon: Conrad Lyman, MD;  Location: [facility];  Service: Vascular;  Laterality: Left;  . FISTULOGRAM N/A 10/19/2013   Procedure: FISTULOGRAM;  Surgeon: Rosetta Posner, MD;  Location: [facility] CATH LAB;  Service: Cardiovascular;  Laterality: N/A;  . Left arm AV fistula  2009   Dr. [name]  . PERIPHERAL VASCULAR CATHETERIZATION N/A 03/10/2015   Procedure: Fistulagram;  Surgeon: Conrad [place], MD;  Location: [facility] CV LAB;  Service: Cardiovascular;  Laterality: N/A;  . Right nephrectomy      Allergies  Allergen Reactions  . Metformin And Related Other (See Comments)    Diarrhea, bloody stool, upset stomach.     Prior to Admission medications   Medication Sig Start Date End Date Taking? Authorizing Provider  allopurinol (ZYLOPRIM) 100 MG tablet Take 100 mg by mouth daily.    Yes [provider]  carvedilol (COREG) 25 MG tablet TAKE 1 TABLET TWICE DAILY WITH MEALS Patient taking differently: Take 12.5 mg by mouth 2 (two) times daily with a meal.  04/21/17  Yes [name], MD  cinacalcet (SENSIPAR) 30 MG tablet Take 30 mg by mouth every evening.  Yes [provider]  diltiazem (CARDIZEM CD) 180 MG 24 hr capsule TAKE 1 CAPSULE EVERY DAY Patient taking differently: Take 180 mg by mouth every morning.  03/07/18  Yes [name], MD  docusate sodium (COLACE) 100 MG capsule Take 100 mg by mouth daily.   Yes [provider]  fenofibrate 160 MG tablet Take 160 mg by mouth at bedtime.    Yes [provider]  fish oil-omega-3 fatty acids 1000 MG capsule Take 1 g by mouth 3 (three) times daily.    Yes [provider]    gabapentin (NEURONTIN) 300 MG capsule Take 300 mg by mouth 3 (three) times daily.    Yes [provider]  glipiZIDE (GLUCOTROL XL) 5 MG 24 hr tablet Take 5 mg by mouth every morning.  02/13/17  Yes [provider]  pantoprazole (PROTONIX) 40 MG tablet Take 1 tablet (40 mg total) by mouth daily. Patient taking differently: Take 40 mg by mouth every evening.  04/05/17  Yes Virl Cagey, MD  pravastatin (PRAVACHOL) 10 MG tablet Take 5 mg by mouth at bedtime.    Yes [provider]  sevelamer carbonate (RENVELA) 800 MG tablet Take 1,600-3,200 mg by mouth See admin instructions. Take 3200 mg after each meal and 1600 mg each snack   Yes [provider]  TRADJENTA 5 MG TABS tablet Take 5 mg by mouth every morning.  04/13/17  Yes [provider]  warfarin (COUMADIN) 2.5 MG tablet TAKE 1 TABLET EVERY DAY EXCEPT TAKE  1/2 TABLET ON SUNDAYS, [place] OR AS DIRECTED Patient taking differently: Take 2.5 mg by mouth every evening.  02/23/17  Yes [name], MD  Wheat Dextrin (BENEFIBER DRINK MIX PO) Take 1 Dose by mouth daily. 1 tbsp    Yes [provider]  blood glucose meter kit and supplies Dispense based on patient and insurance preference. Use up to four times daily as directed. (FOR ICD-9 250.00, 250.01). 01/22/17   [name], MD  Insulin Pen Needle 31G X 5 MM MISC Use as directed 01/22/17   [name], MD    Social History   Socioeconomic History  . Marital status: Single    Spouse name: Not on file  . Number of children: Not on file  . Years of education: Not on file  . Highest education level: Not on file  Occupational History  . Not on file  Social Needs  . Financial resource strain: Not on file  . Food insecurity:    Worry: Not on file    Inability: Not on file  . Transportation needs:    Medical: Not on file    Non-medical: Not on file  Tobacco Use  . Smoking status: Former Smoker     Types: Cigarettes    Last attempt to quit: 07/04/1998    Years since quitting: 19.7  . Smokeless tobacco: Never Used  . Tobacco comment: Quit May 2000  Substance and Sexual Activity  . Alcohol use: No    Alcohol/week: 0.0 standard drinks    Comment: Quit 2014; Previously drank about 3 drinks a day  . Drug use: No  . Sexual activity: Not on file  Lifestyle  . Physical activity:    Days per week: Not on file    Minutes per session: Not on file  . Stress: Not on file  Relationships  . Social connections:    Talks on phone: Not on file  Gets together: Not on file    Attends religious service: Not on file    Active member of club or organization: Not on file    Attends meetings of clubs or organizations: Not on file    Relationship status: Not on file  . Intimate partner violence:    Fear of current or ex partner: Not on file    Emotionally abused: Not on file    Physically abused: Not on file    Forced sexual activity: Not on file  Other Topics Concern  . Not on file  Social History Narrative  . Not on file    Family History  Problem Relation Age of Onset  . Hypertension Mother   . Hypertension Other        Siblings  . Diabetes type II Other        Siblings  . Colon cancer Neg Hx     ROS:  Cardiovascular: _0  chest pain/pressure _1  palpitations _2  SOB lying flat _3  DOE _4  pain in legs while walking _5  pain in legs at rest _6  pain in legs at night _7  non-healing ulcers _8  hx of DVT _9  swelling in legs  Pulmonary: _10  productive cough _11  asthma/wheezing _12  home O2  Neurologic: _13  weakness in _14  arms _15  legs _16  numbness in _17  arms _18  legs _19  hx of CVA _20  mini stroke _21 difficulty speaking or slurred speech _22  temporary loss of vision in one eye _23  dizziness  Hematologic: _24  hx of cancer _25  bleeding problems _26  problems with blood clotting easily  Endocrine:   _27  diabetes _28  thyroid disease  GI _29  vomiting blood _30  blood in stool  GU: _31   CKD/renal failure _32  HD--_33  M/W/F or _34  T/T/S _35  burning with urination _36  blood in urine  Psychiatric: _37  anxiety _38  depression  Musculoskeletal: _39  arthritis _40  joint pain  Integumentary: _41  rashes _42  ulcers  Constitutional: _43  fever _44  chills   Physical Examination  Vitals:   03/22/18 2016 03/23/18 0550  BP: 98/70 121/79  Pulse: 98 (!) 121  Resp: 18 18  Temp: 97.8 F (36.6 C) 98.6 F (37 C)  SpO2: 97% 94%   Body mass index is 27.2 kg/m.  General:  nad HENT: WNL Pulmonary: normal non-labored breathing Cardiac: Palpable popliteal pulses bilaterally, I cannot palpate any tibial pulses Abdomen: soft, NT/ND, no masses Extremities: Right second toe dry gangrene, left second toe tip ulceration and heel has a area of superficial necrosis Neurologic: A&O X 3; Appropriate Affect ; SENSATION: normal; MOTOR FUNCTION:  moving all extremities equally. Speech is fluent/normal   CBC    Component Value Date/Time   WBC 7.0 03/23/2018 0438   RBC 4.06 (L) 03/23/2018 0438   HGB 11.3 (L) 03/23/2018 0438   HCT 36.6 (L) 03/23/2018 0438   PLT 201 03/23/2018 0438   MCV 90.1 03/23/2018 0438   MCH 27.8 03/23/2018 0438   MCHC 30.9 03/23/2018 0438   RDW 14.6 03/23/2018 0438   LYMPHSABS 0.7 03/20/2018 1931   MONOABS 0.7 03/20/2018 1931   EOSABS 0.1 03/20/2018 1931   BASOSABS 0.0 03/20/2018 1931    BMET    Component Value Date/Time   NA 135 03/23/2018 0438   K 5.3 (H) 03/23/2018 0438   CL 93 (L) 03/23/2018 0438   CO2 26 03/23/2018 0438   GLUCOSE 128 (H) 03/23/2018 0438   BUN 62 (H) 03/23/2018 0438   CREATININE 10.37 (H) 03/23/2018 0438   CALCIUM 8.5 (L) 03/23/2018 0438   CALCIUM 9.0 03/21/2007 0415  GFRNONAA 5 (L) 03/23/2018 0438   GFRAA 5 (L) 03/23/2018 0438    COAGS: Lab Results  Component Value Date   INR 3.94 03/23/2018   INR 4.46 (HH) 03/22/2018   INR 4.52 (HH) 03/21/2018     Non-Invasive Vascular Imaging:    abi: FINDINGS: Right ABI:   Noncompressible vessels at the ankle.  Left ABI:  Noncompressible vessels at the ankle.  Right Lower Extremity: Monophasic posterior tibial and dorsalis pedis waveforms.  Left Lower Extremity: The posterior tibial Doppler waveform is absent. The dorsalis pedis waveform is monophasic.   ASSESSMENT/PLAN: This is a 63 y.o. male with diabetes end-stage renal disease likely has tibial disease bilaterally with palpable popliteal pulses.  He has unfortunately high risk for bilateral proximal amputations.  He is actually hopeful to just keep one leg to help with transfers.  He does walk.  He is highly functional.  We will begin with angiogram to evaluate his bilateral lower extremities from a right common femoral approach given that his left does hurt him worse and he thinks that this is progressing more rapidly than the right.  INR currently supratherapeutic so we will wait until Monday for hopefully his INR can be less than 2.  Okay for heparin drip up to the time of procedure if his INR is completely corrected.  I discussed the risk benefits alternatives he agrees to proceed.  Damon Baisch C. Donzetta Matters, MD [facility] Office: [phone] Pager: [phone]

## 2018-03-24 ENCOUNTER — Inpatient Hospital Stay (HOSPITAL_COMMUNITY): Payer: Medicare HMO

## 2018-03-24 ENCOUNTER — Ambulatory Visit (HOSPITAL_COMMUNITY): Payer: Medicare HMO | Admitting: Physical Therapy

## 2018-03-24 DIAGNOSIS — N186 End stage renal disease: Secondary | ICD-10-CM | POA: Diagnosis not present

## 2018-03-24 DIAGNOSIS — Z992 Dependence on renal dialysis: Secondary | ICD-10-CM | POA: Diagnosis not present

## 2018-03-24 DIAGNOSIS — L089 Local infection of the skin and subcutaneous tissue, unspecified: Secondary | ICD-10-CM

## 2018-03-24 DIAGNOSIS — Z01810 Encounter for preprocedural cardiovascular examination: Secondary | ICD-10-CM

## 2018-03-24 LAB — GLUCOSE, CAPILLARY
Glucose-Capillary: 135 mg/dL — ABNORMAL HIGH (ref 70–99)
Glucose-Capillary: 123 mg/dL — ABNORMAL HIGH (ref 70–99)
Glucose-Capillary: 146 mg/dL — ABNORMAL HIGH (ref 70–99)
Glucose-Capillary: 135 mg/dL — ABNORMAL HIGH (ref 70–99)

## 2018-03-24 LAB — PROTIME-INR
INR: 4.16
Prothrombin Time: 39.6 seconds — ABNORMAL HIGH (ref 11.4–15.2)

## 2018-03-24 MED ORDER — CHLORHEXIDINE GLUCONATE CLOTH 2 % EX PADS
6.0000 | MEDICATED_PAD | Freq: Every day | CUTANEOUS | Status: DC
  Administered 2018-03-27 – 2018-04-11 (×9): 6 via TOPICAL

## 2018-03-24 MED ORDER — METOPROLOL TARTRATE 5 MG/5ML IV SOLN
5.0000 mg | INTRAVENOUS | Status: AC | PRN
  Administered 2018-03-30 – 2018-04-04 (×2): 5 mg via INTRAVENOUS
  Filled 2018-03-24 (×4): qty 5

## 2018-03-24 NOTE — Progress Notes (Signed)
LE vein mapping       has been completed. Preliminary results can be found under CV proc through chart review. June Leap, BS, RDMS, RVT

## 2018-03-24 NOTE — Progress Notes (Signed)
Pt. HR sustaining in the 120s-130s running atrial fib; atrial flutter. VVS. Kennon Holter, NP notified.

## 2018-03-24 NOTE — Progress Notes (Addendum)
[facility] PROGRESS NOTE    Progress Note  Albert [name]  BMW:[id number] DOB: 22-Nov-1955 DOA: 03/20/2018 PCP: Redmond School, MD     Brief Narrative:   Albert [name] is an 63 y.o. male past medical history of end-stage renal disease on dialysis, atrial fibrillation on Coumadin comes in with left heel cellulitis and necrotic second great right toe ABIs performed that showed compromised blood flow bilaterally  Assessment/Plan:   Cellulitis of left lower extremity/dry gangrenous right second toe severe bilateral peripheral vascular disease: Transfer from [facility] for arteriogram. ABI's show severe bilateral extremity arterial occlusive disease. Heel x-ray showed no osteomyelitis.  Only on IV Ancef Case was discussed with Dr. Doren Custard who recommended transfer to [facility] for arteriogram, his INR is elevated, vascular would like the INR to be lower before proceeding with arteriogram. Check INR tomorrow morning. Pain is controlled.  End renal disease on dialysis: Renal following for HD per renal.  He dialyzes Tuesday Thursdays and Saturdays. Vein mapping has been completed.  Chronic atrial fibrillation: Heart rate is controlled on Coreg and diltiazem INR was supratherapeutic, we have been holding his Coumadin INR slowly drifting down.  Diabetes mellitus type II: Good control on sliding scale insulin continue current management.    DVT prophylaxis: INR therapeutic Family Communication:none Disposition Plan/Barrier to D/C: unable to determine Code Status:     Code Status Orders  (From admission, onward)         Start     Ordered   03/20/18 2128  Full code  Continuous     03/20/18 2130        Code Status History    Date Active Date Inactive Code Status Order ID Comments User Context   01/21/2017 1655 01/22/2017 2006 Full Code [id number]  [name], MD Inpatient   01/03/2017 2148 01/06/2017 2047 Full Code [id number]  [name], MD  Inpatient   10/12/2013 0341 10/20/2013 1428 Full Code [id number]  [name], MD Inpatient    Advance Directive Documentation     Most Recent Value  Type of Advance Directive  Living will  Pre-existing out of facility DNR order (yellow form or pink MOST form)  -  "MOST" Form in Place?  -        IV Access:    Peripheral IV   Procedures and diagnostic studies:   Vas [place] Lower Extremity Saphenous Vein Mapping  Result Date: 03/24/2018 LOWER EXTREMITY VEIN MAPPING History: History of PAD; patient is pre-operative for lower extremity bypass          graft.  Performing Technologist: June Leap RDMS, RVT  Examination Guidelines: A complete evaluation includes B-mode imaging, spectral Doppler, color Doppler, and power Doppler as needed of all accessible portions of each vessel. Bilateral testing is considered an integral part of a complete examination. Limited examinations for reoccurring indications may be performed as noted. +---------------+-----------+----------------------+---------------+-----------+   RT Diameter  RT Findings         GSV            LT Diameter  LT Findings      (cm)                                            (cm)                  +---------------+-----------+----------------------+---------------+-----------+  0.62                     Saphenofemoral         0.42                                                   Junction                                  +---------------+-----------+----------------------+---------------+-----------+      0.35                     Proximal thigh         0.38                  +---------------+-----------+----------------------+---------------+-----------+      0.30                       Mid thigh            0.35                  +---------------+-----------+----------------------+---------------+-----------+      0.28                      Distal thigh          0.40                   +---------------+-----------+----------------------+---------------+-----------+      0.41       branching          Knee              0.27                  +---------------+-----------+----------------------+---------------+-----------+      0.34                       Prox calf            0.28                  +---------------+-----------+----------------------+---------------+-----------+      0.30       branching        Mid calf            0.27       branching  +---------------+-----------+----------------------+---------------+-----------+      0.23                      Distal calf           0.36                  +---------------+-----------+----------------------+---------------+-----------+    Preliminary      Medical Consultants:    None.  Anti-Infectives:   IV ancef  Subjective:    [name] relates feels fine no complaints.  Objective:    Vitals:   03/23/18 2115 03/23/18 2151 03/24/18 0522 03/24/18 0819  BP: 115/71 (!) 154/85 101/60 (!) 126/99  Pulse: 70 96 (!) 110 61  Resp: (!) 22 18 19    Temp: 98.4 F (36.9 C) 98.3 F (36.8 C) 97.9 F (36.6 C) 97.8 F (36.6 C)  TempSrc: Oral Oral Oral  Oral  SpO2: 99% 100% 96% 98%  Weight: 95.4 kg 98.4 kg    Height:        Intake/Output Summary (Last 24 hours) at 03/24/2018 1205 Last data filed at 03/24/2018 0949 Gross per 24 hour  Intake 520 ml  Output 3029 ml  Net -2509 ml   Filed Weights   03/23/18 1624 03/23/18 2115 03/23/18 2151  Weight: 98.4 kg 95.4 kg 98.4 kg    Exam: General exam: In no acute distress. Respiratory system: Good air movement and clear to auscultation. Cardiovascular system: S1 & S2 heard, RRR. No JVD. Gastrointestinal system: Abdomen is nondistended, soft and nontender.  Central nervous system: Alert and oriented. No focal neurological deficits. Extremities: No pedal edema. Skin: No rashes, lesions or ulcers Psychiatry: Judgement and insight appear normal. Mood &  affect appropriate.    Data Reviewed:    Labs: Basic Metabolic Panel: Recent Labs  Lab 03/20/18 1931 03/21/18 0515 03/22/18 0905 03/23/18 0438  NA 139 141 136 135  K 4.9 4.8 4.3 5.3*  CL 99 102 93* 93*  CO2 25 23 29 26   GLUCOSE 114* 122* 126* 128*  BUN 82* 88* 47* 62*  CREATININE 11.37* 11.53* 8.03* 10.37*  CALCIUM 8.4* 8.3* 8.4* 8.5*  MG  --  2.1  --   --   PHOS  --  6.8*  --   --    GFR Estimated Creatinine Clearance: 8.4 mL/min (A) (by C-G formula based on SCr of 10.37 mg/dL (H)). Liver Function Tests: No results for input(s): AST, ALT, ALKPHOS, BILITOT, PROT, ALBUMIN in the last 168 hours. No results for input(s): LIPASE, AMYLASE in the last 168 hours. No results for input(s): AMMONIA in the last 168 hours. Coagulation profile Recent Labs  Lab 03/20/18 1933 03/21/18 0515 03/22/18 0905 03/23/18 0438 03/24/18 0452  INR 3.79 4.52* 4.46* 3.94 4.16*    CBC: Recent Labs  Lab 03/20/18 1931 03/21/18 0515 03/22/18 0905 03/23/18 0438  WBC 5.7 6.4 6.6 7.0  NEUTROABS 4.2  --   --   --   HGB 11.6* 10.7* 11.3* 11.3*  HCT 38.4* 35.1* 36.9* 36.6*  MCV 93.9 92.6 91.1 90.1  PLT 211 207 220 201   Cardiac Enzymes: No results for input(s): CKTOTAL, CKMB, CKMBINDEX, TROPONINI in the last 168 hours. BNP (last 3 results) No results for input(s): PROBNP in the last 8760 hours. CBG: Recent Labs  Lab 03/23/18 1122 03/23/18 1238 03/23/18 2149 03/24/18 0731 03/24/18 1111  GLUCAP 155* 154* 116* 135* 123*   D-Dimer: No results for input(s): DDIMER in the last 72 hours. Hgb A1c: No results for input(s): HGBA1C in the last 72 hours. Lipid Profile: No results for input(s): CHOL, HDL, LDLCALC, TRIG, CHOLHDL, LDLDIRECT in the last 72 hours. Thyroid function studies: No results for input(s): TSH, T4TOTAL, T3FREE, THYROIDAB in the last 72 hours.  Invalid input(s): FREET3 Anemia work up: No results for input(s): VITAMINB12, FOLATE, FERRITIN, TIBC, IRON, RETICCTPCT in the  last 72 hours. Sepsis Labs: Recent Labs  Lab 03/20/18 1931 03/21/18 0515 03/22/18 0905 03/23/18 0438  WBC 5.7 6.4 6.6 7.0   Microbiology Recent Results (from the past 240 hour(s))  MRSA PCR Screening     Status: None   Collection Time: 03/22/18  6:13 AM  Result Value Ref Range Status   MRSA by PCR NEGATIVE NEGATIVE Final    Comment:        The GeneXpert MRSA Assay (FDA approved for NASAL specimens only), is one component of a comprehensive  MRSA colonization surveillance program. It is not intended to diagnose MRSA infection nor to guide or monitor treatment for MRSA infections. Performed at [facility], [address], [place], [place] [address]      Medications:   . allopurinol  100 mg Oral Daily  . carvedilol  12.5 mg Oral BID WC  . Chlorhexidine Gluconate Cloth  6 each Topical Q0600  . Chlorhexidine Gluconate Cloth  6 each Topical Q0600  . Chlorhexidine Gluconate Cloth  6 each Topical Q0600  . cinacalcet  30 mg Oral Q supper  . diltiazem  180 mg Oral Daily  . docusate sodium  100 mg Oral Daily  . doxercalciferol  3.5 mcg Intravenous Q T,Th,Sa-HD  . insulin aspart  0-9 Units Subcutaneous TID WC  . pantoprazole  40 mg Oral Daily  . pravastatin  5 mg Oral QPM  . sevelamer carbonate  1,600 mg Oral With snacks  . sevelamer carbonate  3,200 mg Oral TID WC   Continuous Infusions: .  ceFAZolin (ANCEF) IV 1 g (03/23/18 2328)      LOS: 4 days   Charlynne Cousins  [facility]  03/24/2018, 12:05 PM

## 2018-03-24 NOTE — Progress Notes (Signed)
[facility] ROUNDING NOTE   Subjective:   History of end-stage renal disease atrial fibrillation on Coumadin left heel cellulitis/ necrotic lesion and also necrotic R second great toe gangrene.  ABIs revealing bilateral extremity arterial occlusive disease.  Will need arteriogram as further diagnostic study. VVS planning on early next week.  He has been transferred from [facility] to [facility]. Dialysis Tuesday Thursday Saturday  Blood pressure 121/79 pulse 120 temp 98.6 O2 sats 94% room air    Objective:  Vital signs in last 24 hours:  Temp:  [97.8 F (36.6 C)-98.4 F (36.9 C)] 97.8 F (36.6 C) (01/31 0819) Pulse Rate:  [61-118] 61 (01/31 0819) Resp:  [14-25] 19 (01/31 0522) BP: (88-154)/(55-99) 126/99 (01/31 0819) SpO2:  [96 %-100 %] 98 % (01/31 0819) Weight:  [95.4 kg-98.4 kg] 98.4 kg (01/30 2151)  Weight change:  Filed Weights   03/23/18 1624 03/23/18 2115 03/23/18 2151  Weight: 98.4 kg 95.4 kg 98.4 kg    Intake/Output: I/O last 3 completed shifts: In: 25 [P.O.:440; IV Piggyback:100] Out: 3029 [Other:3029]   Intake/Output this shift:  Total I/O In: 300 [P.O.:300] Out: 0   Gen: Comfortably resting in bed CVS: Pulse regular tachycardia, S1 and S2 normal Resp:  Clear to auscultation no wheezes rales Abd:  Nontender nondistended Ext: Left foot erythema noted, left foot tip of 2nd toe gangrenous and right foot entire 2nd toe gangrenous.  Left BCF.   Basic Metabolic Panel: Recent Labs  Lab 03/20/18 1931 03/21/18 0515 03/22/18 0905 03/23/18 0438  NA 139 141 136 135  K 4.9 4.8 4.3 5.3*  CL 99 102 93* 93*  CO2 25 23 29 26   GLUCOSE 114* 122* 126* 128*  BUN 82* 88* 47* 62*  CREATININE 11.37* 11.53* 8.03* 10.37*  CALCIUM 8.4* 8.3* 8.4* 8.5*  MG  --  2.1  --   --   PHOS  --  6.8*  --   --     Liver Function Tests: No results for input(s): AST, ALT, ALKPHOS, BILITOT, PROT, ALBUMIN in the last 168 hours. No results for input(s):  LIPASE, AMYLASE in the last 168 hours. No results for input(s): AMMONIA in the last 168 hours.  CBC: Recent Labs  Lab 03/20/18 1931 03/21/18 0515 03/22/18 0905 03/23/18 0438  WBC 5.7 6.4 6.6 7.0  NEUTROABS 4.2  --   --   --   HGB 11.6* 10.7* 11.3* 11.3*  HCT 38.4* 35.1* 36.9* 36.6*  MCV 93.9 92.6 91.1 90.1  PLT 211 207 220 201    Cardiac Enzymes: No results for input(s): CKTOTAL, CKMB, CKMBINDEX, TROPONINI in the last 168 hours.  BNP: Invalid input(s): POCBNP  CBG: Recent Labs  Lab 03/23/18 0729 03/23/18 1122 03/23/18 1238 03/23/18 2149 03/24/18 0731  GLUCAP 128* 155* 154* 116* 135*    Microbiology: Results for orders placed or performed during the hospital encounter of 03/20/18  MRSA PCR Screening     Status: None   Collection Time: 03/22/18  6:13 AM  Result Value Ref Range Status   MRSA by PCR NEGATIVE NEGATIVE Final    Comment:        The GeneXpert MRSA Assay (FDA approved for NASAL specimens only), is one component of a comprehensive MRSA colonization surveillance program. It is not intended to diagnose MRSA infection nor to guide or monitor treatment for MRSA infections. Performed at [facility], [address] [place], [place] [address]     Coagulation Studies: Recent Labs    03/22/18  1610 03/23/18 0438 03/24/18 0452  LABPROT 41.7* 37.9* 39.6*  INR 4.46* 3.94 4.16*    Urinalysis: No results for input(s): COLORURINE, LABSPEC, PHURINE, GLUCOSEU, HGBUR, BILIRUBINUR, KETONESUR, PROTEINUR, UROBILINOGEN, NITRITE, LEUKOCYTESUR in the last 72 hours.  Invalid input(s): APPERANCEUR    Imaging: No results found.   Medications:   .  ceFAZolin (ANCEF) IV 1 g (03/23/18 2328)   . allopurinol  100 mg Oral Daily  . carvedilol  12.5 mg Oral BID WC  . Chlorhexidine Gluconate Cloth  6 each Topical Q0600  . Chlorhexidine Gluconate Cloth  6 each Topical Q0600  . cinacalcet  30 mg Oral Q supper  . diltiazem  180 mg Oral Daily  . docusate sodium   100 mg Oral Daily  . doxercalciferol  3.5 mcg Intravenous Q T,Th,Sa-HD  . insulin aspart  0-9 Units Subcutaneous TID WC  . pantoprazole  40 mg Oral Daily  . pravastatin  5 mg Oral QPM  . sevelamer carbonate  1,600 mg Oral With snacks  . sevelamer carbonate  3,200 mg Oral TID WC   HYDROcodone-acetaminophen, metoprolol tartrate  TTS [facility]  Hep none  4h 75min  97kg   L BCF - hectorol 3.5ug   Assessment/ Plan:   End-stage renal disease with dialysis Tuesday Thursday Saturday schedule.  Followed [facility] Dr. Lowanda Foster. HD tomorrow.   Peripheral vascular disease with lower extremity cellulitis and gangrene will need arteriogram continues Ancef 1 g daily  Anemia stable no issue at this time  Bones continue Sevelamer 3.2 g with meals IV Hectorol 3.5 mcg  History of cardiomyopathy ejection fraction 50 to 55%  Atrial fibrillation continue diltiazem 180 mg daily, carvedilol 12.5 mg twice daily  Hypertension/volume appears to be controlled on exam   Kelly Splinter MD [employer] pgr [phone]   03/24/2018, 10:30 AM

## 2018-03-24 NOTE — Progress Notes (Signed)
   INR remains elevated.  Tentatively planning for angiogram to evaluate bilateral lower extremities from right common femoral approach on Monday morning.  We will have to check INR prior.  Please keep n.p.o. after midnight Sunday.  There are issues over the weekend please call on-call vascular surgeon.  Trent Gabler C. Donzetta Matters, MD [facility] Office: [phone] Pager: [phone]

## 2018-03-24 NOTE — Plan of Care (Signed)
  Problem: Nutrition: Goal: Adequate nutrition will be maintained Outcome: Progressing   

## 2018-03-24 NOTE — Progress Notes (Signed)
CRITICAL VALUE ALERT  Critical Value: INR 4.16  Date & Time Notied:  03/24/2018 @0547   Provider Notified: Kennon Holter, NP  Orders Received/Actions taken: awaiting orders

## 2018-03-25 DIAGNOSIS — N186 End stage renal disease: Secondary | ICD-10-CM | POA: Diagnosis not present

## 2018-03-25 DIAGNOSIS — Z992 Dependence on renal dialysis: Secondary | ICD-10-CM | POA: Diagnosis not present

## 2018-03-25 LAB — HEPATITIS B SURFACE ANTIGEN: Hepatitis B Surface Ag: NEGATIVE

## 2018-03-25 LAB — RENAL FUNCTION PANEL
Albumin: 2.1 g/dL — ABNORMAL LOW (ref 3.5–5.0)
Anion gap: 17 — ABNORMAL HIGH (ref 5–15)
BUN: 63 mg/dL — ABNORMAL HIGH (ref 8–23)
CHLORIDE: 93 mmol/L — AB (ref 98–111)
CO2: 23 mmol/L (ref 22–32)
Calcium: 8.2 mg/dL — ABNORMAL LOW (ref 8.9–10.3)
Creatinine, Ser: 9.08 mg/dL — ABNORMAL HIGH (ref 0.61–1.24)
GFR calc Af Amer: 6 mL/min — ABNORMAL LOW (ref >60)
GFR, EST NON AFRICAN AMERICAN: 5 mL/min — AB (ref >60)
Glucose, Bld: 149 mg/dL — ABNORMAL HIGH (ref 70–99)
Phosphorus: 5.8 mg/dL — ABNORMAL HIGH (ref 2.5–4.6)
Potassium: 4.1 mmol/L (ref 3.5–5.1)
Sodium: 133 mmol/L — ABNORMAL LOW (ref 135–145)

## 2018-03-25 LAB — CBC
HCT: 32.2 % — ABNORMAL LOW (ref 39.0–52.0)
Hemoglobin: 10 g/dL — ABNORMAL LOW (ref 13.0–17.0)
MCH: 27.6 pg (ref 26.0–34.0)
MCHC: 31.1 g/dL (ref 30.0–36.0)
MCV: 89 fL (ref 80.0–100.0)
Platelets: 188 10*3/uL (ref 150–400)
RBC: 3.62 MIL/uL — ABNORMAL LOW (ref 4.22–5.81)
RDW: 14.3 % (ref 11.5–15.5)
WBC: 6.6 10*3/uL (ref 4.0–10.5)
nRBC: 0 % (ref 0.0–0.2)

## 2018-03-25 LAB — GLUCOSE, CAPILLARY
GLUCOSE-CAPILLARY: 114 mg/dL — AB (ref 70–99)
Glucose-Capillary: 159 mg/dL — ABNORMAL HIGH (ref 70–99)
Glucose-Capillary: 122 mg/dL — ABNORMAL HIGH (ref 70–99)

## 2018-03-25 LAB — PROTIME-INR
INR: 4.31
Prothrombin Time: 40.6 seconds — ABNORMAL HIGH (ref 11.4–15.2)

## 2018-03-25 MED ORDER — ACETAMINOPHEN 325 MG PO TABS
650.0000 mg | ORAL_TABLET | Freq: Four times a day (QID) | ORAL | Status: DC | PRN
  Administered 2018-03-25: 650 mg via ORAL
  Filled 2018-03-25: qty 2

## 2018-03-25 MED ORDER — PHYTONADIONE 1 MG/0.5 ML ORAL SOLUTION
1.0000 mg | Freq: Once | ORAL | Status: AC
  Administered 2018-03-25: 1 mg via ORAL
  Filled 2018-03-25: qty 0.5

## 2018-03-25 MED ORDER — OXYCODONE-ACETAMINOPHEN 5-325 MG PO TABS
ORAL_TABLET | ORAL | Status: AC
  Administered 2018-03-25: 1
  Filled 2018-03-25: qty 1

## 2018-03-25 MED ORDER — DARBEPOETIN ALFA 60 MCG/0.3ML IJ SOSY
PREFILLED_SYRINGE | INTRAMUSCULAR | Status: AC
  Administered 2018-03-25: 60 ug via INTRAVENOUS
  Filled 2018-03-25: qty 0.3

## 2018-03-25 MED ORDER — PHYTONADIONE 1 MG/0.5 ML ORAL SOLUTION
1.0000 mg | Freq: Once | ORAL | Status: DC
  Filled 2018-03-25: qty 0.5

## 2018-03-25 MED ORDER — DARBEPOETIN ALFA 60 MCG/0.3ML IJ SOSY
60.0000 ug | PREFILLED_SYRINGE | INTRAMUSCULAR | Status: DC
  Administered 2018-03-25 – 2018-04-01 (×2): 60 ug via INTRAVENOUS
  Filled 2018-03-25: qty 0.3

## 2018-03-25 MED ORDER — DOXERCALCIFEROL 4 MCG/2ML IV SOLN
INTRAVENOUS | Status: AC
  Administered 2018-03-25: 3.5 ug via INTRAVENOUS
  Filled 2018-03-25: qty 2

## 2018-03-25 NOTE — Progress Notes (Addendum)
CRITICAL VALUE ALERT  Critical Value:  INR 4.31 and Prothrombin time 40.6  Date & Time Notied:  03/25/2018 @ 0527  Provider Notified: X. Blount, NP  Orders Received/Actions taken: No new orders given.

## 2018-03-25 NOTE — Progress Notes (Signed)
Patient ID: Albert [name], male   DOB: 1955-04-28, 63 y.o.   MRN: [id number]  [facility] Progress Note   Assessment/ Plan:   1.  Cellulitis left lower extremity with bilateral arterial insufficiency: Plans noted for arteriogram next week after warfarin held. On  2.  End-stage renal disease:  Ongoing hemodialysis today without problems, will continue this on a TTS schedule. He is euvolemic.  3.  Peripheral vascular disease: With necrotic/ischemic appearing third toes bilaterally and left heel superficial necrosis- with severe bilaterally occlusive disease and plans for arteriogram. 4.  Hypertension: Blood pressure under fair control, monitor with hemodialysis/ultrafiltration. 5.  Anemia of chronic kidney disease: Hemoglobin/hematocrit within acceptable range.  No overt loss.  Restart ESA. 6.  Metabolic bone disease: Prior elevated phosphorus on sevelamer, continue cinacalcet and Hectorol for PTH suppression.  Subjective:   Complains of intermittent right foot throbbing pain and less severe left foot pain.   Objective:   BP 131/73 (BP Location: Right Arm)   Pulse 82   Temp 98.3 F (36.8 C) (Oral)   Resp 18   Ht 6\' 2"  (1.88 m)   Wt 97.5 kg   SpO2 99%   BMI 27.60 kg/m   Physical Exam: Gen: Comfortably resting in dialysis CVS: Pulse regular rhythm and normal rate, S1 and S2 normal Resp: Clear to auscultation bilaterally, no distinct rales or rhonchi Abd: Soft, flat, nontender Ext: Left foot erythema noted, left foot tip of third toe gangrenous and right foot entire third toe gangrenous.  Left BCF cannulated.  Labs: BMET Recent Labs  Lab 03/20/18 1931 03/21/18 0515 03/22/18 0905 03/23/18 0438  NA 139 141 136 135  K 4.9 4.8 4.3 5.3*  CL 99 102 93* 93*  CO2 25 23 29 26   GLUCOSE 114* 122* 126* 128*  BUN 82* 88* 47* 62*  CREATININE 11.37* 11.53* 8.03* 10.37*  CALCIUM 8.4* 8.3* 8.4* 8.5*  PHOS  --  6.8*  --   --    CBC Recent Labs  Lab 03/20/18 1931  03/21/18 0515 03/22/18 0905 03/23/18 0438 03/25/18 0805  WBC 5.7 6.4 6.6 7.0 6.6  NEUTROABS 4.2  --   --   --   --   HGB 11.6* 10.7* 11.3* 11.3* 10.0*  HCT 38.4* 35.1* 36.9* 36.6* 32.2*  MCV 93.9 92.6 91.1 90.1 89.0  PLT 211 207 220 201 188   Medications:    . allopurinol  100 mg Oral Daily  . carvedilol  12.5 mg Oral BID WC  . Chlorhexidine Gluconate Cloth  6 each Topical Q0600  . Chlorhexidine Gluconate Cloth  6 each Topical Q0600  . Chlorhexidine Gluconate Cloth  6 each Topical Q0600  . cinacalcet  30 mg Oral Q supper  . diltiazem  180 mg Oral Daily  . docusate sodium  100 mg Oral Daily  . doxercalciferol  3.5 mcg Intravenous Q T,Th,Sa-HD  . insulin aspart  0-9 Units Subcutaneous TID WC  . pantoprazole  40 mg Oral Daily  . pravastatin  5 mg Oral QPM  . sevelamer carbonate  1,600 mg Oral With snacks  . sevelamer carbonate  3,200 mg Oral TID WC   Elmarie Shiley, MD 03/25/2018, 9:13 AM

## 2018-03-25 NOTE — Progress Notes (Addendum)
[facility] PROGRESS NOTE    Progress Note  Albert [name]  GYB:[id number] DOB: Nov 13, 1955 DOA: 03/20/2018 PCP: Redmond School, MD     Brief Narrative:   Albert [name] is an 63 y.o. male past medical history of end-stage renal disease on dialysis, atrial fibrillation on Coumadin comes in with left heel cellulitis and necrotic second great right toe ABIs performed that showed compromised blood flow bilaterally  Assessment/Plan:   Cellulitis of left lower extremity/dry gangrenous right second toe severe bilateral peripheral vascular disease: - Transfer from [facility] for arteriogram. - ABI's show severe bilateral extremity arterial occlusive disease. - cont. IV Ancef -Arteriogram on 03/27/2018, INR is raising, see below for further details.  End renal disease on dialysis: Renal following for HD per renal.  He dialyzes Tuesday Thursdays and Saturdays.  Chronic atrial fibrillation: Heart rate is controlled on Coreg and diltiazem INR was supratherapeutic, we have been holding his Coumadin INR has drifted up. We will give him 1 mg of vitamin K orally. Recheck INR in am.  Diabetes mellitus type II: Good control on sliding scale insulin continue current management.    DVT prophylaxis: INR therapeutic Family Communication:none Disposition Plan/Barrier to D/C: unable to determine Code Status:     Code Status Orders  (From admission, onward)         Start     Ordered   03/20/18 2128  Full code  Continuous     03/20/18 2130        Code Status History    Date Active Date Inactive Code Status Order ID Comments User Context   01/21/2017 1655 01/22/2017 2006 Full Code [id number]  [name], MD Inpatient   01/03/2017 2148 01/06/2017 2047 Full Code [id number]  [name], MD Inpatient   10/12/2013 0341 10/20/2013 1428 Full Code [id number]  [name], MD Inpatient    Advance Directive Documentation     Most Recent Value  Type of Advance  Directive  Living will  Pre-existing out of facility DNR order (yellow form or pink MOST form)  -  "MOST" Form in Place?  -        IV Access:    Peripheral IV   Procedures and diagnostic studies:   Vas [place] Lower Extremity Saphenous Vein Mapping  Result Date: 03/24/2018 LOWER EXTREMITY VEIN MAPPING History: History of PAD; patient is pre-operative for lower extremity bypass          graft.  Performing Technologist: June Leap RDMS, RVT  Examination Guidelines: A complete evaluation includes B-mode imaging, spectral Doppler, color Doppler, and power Doppler as needed of all accessible portions of each vessel. Bilateral testing is considered an integral part of a complete examination. Limited examinations for reoccurring indications may be performed as noted. +---------------+-----------+----------------------+---------------+-----------+   RT Diameter  RT Findings         GSV            LT Diameter  LT Findings      (cm)                                            (cm)                  +---------------+-----------+----------------------+---------------+-----------+      0.62  Saphenofemoral         0.42                                                   Junction                                  +---------------+-----------+----------------------+---------------+-----------+      0.35                     Proximal thigh         0.38                  +---------------+-----------+----------------------+---------------+-----------+      0.30                       Mid thigh            0.35                  +---------------+-----------+----------------------+---------------+-----------+      0.28                      Distal thigh          0.40                  +---------------+-----------+----------------------+---------------+-----------+      0.41       branching          Knee              0.27                   +---------------+-----------+----------------------+---------------+-----------+      0.34                       Prox calf            0.28                  +---------------+-----------+----------------------+---------------+-----------+      0.30       branching        Mid calf            0.27       branching  +---------------+-----------+----------------------+---------------+-----------+      0.23                      Distal calf           0.36                  +---------------+-----------+----------------------+---------------+-----------+ Diagnosing physician: Curt Jews MD Electronically signed by Curt Jews MD on 03/24/2018 at 4:53:39 PM.    Final      Medical Consultants:    None.  Anti-Infectives:   IV ancef  Subjective:    Albert [name] relates feels fine no complaints.  Objective:    Vitals:   03/25/18 0725 03/25/18 0744 03/25/18 0815 03/25/18 0845  BP: 126/83 (!) 144/74 113/79 131/73  Pulse: (!) 111 99 (!) 107 82  Resp: 18 18 18 18   Temp: 98.3 F (36.8 C)     TempSrc: Oral     SpO2: 99%     Weight: 97.5 kg  Height:        Intake/Output Summary (Last 24 hours) at 03/25/2018 0928 Last data filed at 03/25/2018 0600 Gross per 24 hour  Intake 1040 ml  Output 0 ml  Net 1040 ml   Filed Weights   03/23/18 2151 03/24/18 2152 03/25/18 0725  Weight: 98.4 kg 96.2 kg 97.5 kg    Exam: General exam: In no acute distress. Respiratory system: Good air movement and clear to auscultation. Cardiovascular system: S1 & S2 heard, RRR. No JVD. Gastrointestinal system: Abdomen is nondistended, soft and nontender.  Central nervous system: Alert and oriented. No focal neurological deficits. Extremities: No pedal edema. Skin: No rashes, lesions or ulcers Psychiatry: Judgement and insight appear normal. Mood & affect appropriate.    Data Reviewed:    Labs: Basic Metabolic Panel: Recent Labs  Lab 03/20/18 1931 03/21/18 0515 03/22/18 0905  03/23/18 0438  NA 139 141 136 135  K 4.9 4.8 4.3 5.3*  CL 99 102 93* 93*  CO2 25 23 29 26   GLUCOSE 114* 122* 126* 128*  BUN 82* 88* 47* 62*  CREATININE 11.37* 11.53* 8.03* 10.37*  CALCIUM 8.4* 8.3* 8.4* 8.5*  MG  --  2.1  --   --   PHOS  --  6.8*  --   --    GFR Estimated Creatinine Clearance: 8.4 mL/min (A) (by C-G formula based on SCr of 10.37 mg/dL (H)). Liver Function Tests: No results for input(s): AST, ALT, ALKPHOS, BILITOT, PROT, ALBUMIN in the last 168 hours. No results for input(s): LIPASE, AMYLASE in the last 168 hours. No results for input(s): AMMONIA in the last 168 hours. Coagulation profile Recent Labs  Lab 03/21/18 0515 03/22/18 0905 03/23/18 0438 03/24/18 0452 03/25/18 0427  INR 4.52* 4.46* 3.94 4.16* 4.31*    CBC: Recent Labs  Lab 03/20/18 1931 03/21/18 0515 03/22/18 0905 03/23/18 0438 03/25/18 0805  WBC 5.7 6.4 6.6 7.0 6.6  NEUTROABS 4.2  --   --   --   --   HGB 11.6* 10.7* 11.3* 11.3* 10.0*  HCT 38.4* 35.1* 36.9* 36.6* 32.2*  MCV 93.9 92.6 91.1 90.1 89.0  PLT 211 207 220 201 188   Cardiac Enzymes: No results for input(s): CKTOTAL, CKMB, CKMBINDEX, TROPONINI in the last 168 hours. BNP (last 3 results) No results for input(s): PROBNP in the last 8760 hours. CBG: Recent Labs  Lab 03/23/18 2149 03/24/18 0731 03/24/18 1111 03/24/18 1609 03/24/18 2237  GLUCAP 116* 135* 123* 146* 135*   D-Dimer: No results for input(s): DDIMER in the last 72 hours. Hgb A1c: No results for input(s): HGBA1C in the last 72 hours. Lipid Profile: No results for input(s): CHOL, HDL, LDLCALC, TRIG, CHOLHDL, LDLDIRECT in the last 72 hours. Thyroid function studies: No results for input(s): TSH, T4TOTAL, T3FREE, THYROIDAB in the last 72 hours.  Invalid input(s): FREET3 Anemia work up: No results for input(s): VITAMINB12, FOLATE, FERRITIN, TIBC, IRON, RETICCTPCT in the last 72 hours. Sepsis Labs: Recent Labs  Lab 03/21/18 0515 03/22/18 0905 03/23/18 0438  03/25/18 0805  WBC 6.4 6.6 7.0 6.6   Microbiology Recent Results (from the past 240 hour(s))  MRSA PCR Screening     Status: None   Collection Time: 03/22/18  6:13 AM  Result Value Ref Range Status   MRSA by PCR NEGATIVE NEGATIVE Final    Comment:        The GeneXpert MRSA Assay (FDA approved for NASAL specimens only), is one component of a comprehensive MRSA colonization surveillance program. It is  not intended to diagnose MRSA infection nor to guide or monitor treatment for MRSA infections. Performed at [facility], [address], [place], [place] [address]      Medications:   . allopurinol  100 mg Oral Daily  . carvedilol  12.5 mg Oral BID WC  . Chlorhexidine Gluconate Cloth  6 each Topical Q0600  . Chlorhexidine Gluconate Cloth  6 each Topical Q0600  . Chlorhexidine Gluconate Cloth  6 each Topical Q0600  . cinacalcet  30 mg Oral Q supper  . darbepoetin (ARANESP) injection - DIALYSIS  60 mcg Intravenous Q Sat-HD  . diltiazem  180 mg Oral Daily  . docusate sodium  100 mg Oral Daily  . doxercalciferol  3.5 mcg Intravenous Q T,Th,Sa-HD  . insulin aspart  0-9 Units Subcutaneous TID WC  . pantoprazole  40 mg Oral Daily  . pravastatin  5 mg Oral QPM  . sevelamer carbonate  1,600 mg Oral With snacks  . sevelamer carbonate  3,200 mg Oral TID WC   Continuous Infusions: .  ceFAZolin (ANCEF) IV 1 g (03/24/18 2115)      LOS: 5 days   Charlynne Cousins  [facility]  03/25/2018, 9:28 AM

## 2018-03-25 NOTE — Procedures (Signed)
Patient seen on Hemodialysis. BP 131/73 (BP Location: Right Arm)   Pulse 82   Temp 98.3 F (36.8 C) (Oral)   Resp 18   Ht 6\' 2"  (1.88 m)   Wt 97.5 kg   SpO2 99%   BMI 27.60 kg/m   QB 400, UF goal 1.5L Tolerating treatment without complaints at this time.   Elmarie Shiley MD [facility]. Office # [phone] Pager # [phone] 9:23 AM

## 2018-03-26 LAB — GLUCOSE, CAPILLARY
GLUCOSE-CAPILLARY: 163 mg/dL — AB (ref 70–99)
Glucose-Capillary: 115 mg/dL — ABNORMAL HIGH (ref 70–99)
Glucose-Capillary: 94 mg/dL (ref 70–99)
Glucose-Capillary: 163 mg/dL — ABNORMAL HIGH (ref 70–99)

## 2018-03-26 LAB — PROTIME-INR
INR: 1.63
PROTHROMBIN TIME: 19.1 s — AB (ref 11.4–15.2)

## 2018-03-26 NOTE — Progress Notes (Signed)
[facility] PROGRESS NOTE    Progress Note  Albert [name]  OYD:[id number] DOB: [birth year] DOA: 03/20/2018 PCP: Redmond School, MD     Brief Narrative:   Albert [name] is an 63 y.o. male past medical history of end-stage renal disease on dialysis, atrial fibrillation on Coumadin comes in with left heel cellulitis and necrotic second great right toe ABIs performed that showed compromised blood flow bilaterally  Assessment/Plan:   Cellulitis of left lower extremity/dry gangrenous right second toe severe bilateral peripheral vascular disease: - Transfer from [facility] for arteriogram. - ABI's show severe bilateral extremity arterial occlusive disease. - cont. IV Ancef -Arteriogram on 03/27/2018, INR is raising, see below for further details.  End renal disease on dialysis: Renal following for HD per renal.  He dialyzes Tuesday Thursdays and Saturdays.  Chronic atrial fibrillation: Heart rate is controlled on Coreg and diltiazem INR was supratherapeutic, we have been holding his Coumadin INR has drifted up. Continue to check INRs daily.  Diabetes mellitus type II: Good control on sliding scale insulin continue current management.    DVT prophylaxis: INR therapeutic Family Communication:none Disposition Plan/Barrier to D/C: unable to determine Code Status:     Code Status Orders  (From admission, onward)         Start     Ordered   03/20/18 2128  Full code  Continuous     03/20/18 2130        Code Status History    Date Active Date Inactive Code Status Order ID Comments User Context   01/21/2017 1655 01/22/2017 2006 Full Code [id number]  [name], MD Inpatient   01/03/2017 2148 01/06/2017 2047 Full Code [id number]  [name], MD Inpatient   10/12/2013 0341 10/20/2013 1428 Full Code [id number]  [name], MD Inpatient    Advance Directive Documentation     Most Recent Value  Type of Advance Directive  Living will    Pre-existing out of facility DNR order (yellow form or pink MOST form)  -  "MOST" Form in Place?  -        IV Access:    Peripheral IV   Procedures and diagnostic studies:   Vas [place] Lower Extremity Saphenous Vein Mapping  Result Date: 03/24/2018 LOWER EXTREMITY VEIN MAPPING History: History of PAD; patient is pre-operative for lower extremity bypass          graft.  Performing Technologist: June Leap RDMS, RVT  Examination Guidelines: A complete evaluation includes B-mode imaging, spectral Doppler, color Doppler, and power Doppler as needed of all accessible portions of each vessel. Bilateral testing is considered an integral part of a complete examination. Limited examinations for reoccurring indications may be performed as noted. +---------------+-----------+----------------------+---------------+-----------+   RT Diameter  RT Findings         GSV            LT Diameter  LT Findings      (cm)                                            (cm)                  +---------------+-----------+----------------------+---------------+-----------+      0.62                     Saphenofemoral  0.42                                                   Junction                                  +---------------+-----------+----------------------+---------------+-----------+      0.35                     Proximal thigh         0.38                  +---------------+-----------+----------------------+---------------+-----------+      0.30                       Mid thigh            0.35                  +---------------+-----------+----------------------+---------------+-----------+      0.28                      Distal thigh          0.40                  +---------------+-----------+----------------------+---------------+-----------+      0.41       branching          Knee              0.27                   +---------------+-----------+----------------------+---------------+-----------+      0.34                       Prox calf            0.28                  +---------------+-----------+----------------------+---------------+-----------+      0.30       branching        Mid calf            0.27       branching  +---------------+-----------+----------------------+---------------+-----------+      0.23                      Distal calf           0.36                  +---------------+-----------+----------------------+---------------+-----------+ Diagnosing physician: Curt Jews MD Electronically signed by Curt Jews MD on 03/24/2018 at 4:53:39 PM.    Final      Medical Consultants:    None.  Anti-Infectives:   IV ancef  Subjective:    Albert [name] no complaints feels great.  Objective:    Vitals:   03/25/18 1708 03/25/18 2030 03/26/18 0309 03/26/18 0733  BP: 116/78 99/60 125/73 102/73  Pulse: 100 98 (!) 115 87  Resp: 18 18  19   Temp: 98.2 F (36.8 C) 100.3 F (37.9 C) 98.2 F (36.8 C) 98.2 F (36.8 C)  TempSrc: Oral Oral Oral Oral  SpO2: 95% 98% 92% 93%  Weight:  94.6 kg    Height:  Intake/Output Summary (Last 24 hours) at 03/26/2018 0942 Last data filed at 03/26/2018 0826 Gross per 24 hour  Intake 550 ml  Output 1235 ml  Net -685 ml   Filed Weights   03/25/18 0725 03/25/18 1207 03/25/18 2030  Weight: 97.5 kg 94.8 kg 94.6 kg    Exam: General exam: In no acute distress. Respiratory system: Good air movement and clear to auscultation. Cardiovascular system: S1 & S2 heard, RRR. No JVD. Gastrointestinal system: Abdomen is nondistended, soft and nontender.  Central nervous system: Alert and oriented. No focal neurological deficits. Extremities: No pedal edema. Skin: No rashes, lesions or ulcers Psychiatry: Judgement and insight appear normal. Mood & affect appropriate.    Data Reviewed:    Labs: Basic Metabolic Panel: Recent Labs    Lab 03/20/18 1931 03/21/18 0515 03/22/18 0905 03/23/18 0438 03/25/18 0805  NA 139 141 136 135 133*  K 4.9 4.8 4.3 5.3* 4.1  CL 99 102 93* 93* 93*  CO2 25 23 29 26 23   GLUCOSE 114* 122* 126* 128* 149*  BUN 82* 88* 47* 62* 63*  CREATININE 11.37* 11.53* 8.03* 10.37* 9.08*  CALCIUM 8.4* 8.3* 8.4* 8.5* 8.2*  MG  --  2.1  --   --   --   PHOS  --  6.8*  --   --  5.8*   GFR Estimated Creatinine Clearance: 9.6 mL/min (A) (by C-G formula based on SCr of 9.08 mg/dL (H)). Liver Function Tests: Recent Labs  Lab 03/25/18 0805  ALBUMIN 2.1*   No results for input(s): LIPASE, AMYLASE in the last 168 hours. No results for input(s): AMMONIA in the last 168 hours. Coagulation profile Recent Labs  Lab 03/22/18 0905 03/23/18 0438 03/24/18 0452 03/25/18 0427 03/26/18 0549  INR 4.46* 3.94 4.16* 4.31* 1.63    CBC: Recent Labs  Lab 03/20/18 1931 03/21/18 0515 03/22/18 0905 03/23/18 0438 03/25/18 0805  WBC 5.7 6.4 6.6 7.0 6.6  NEUTROABS 4.2  --   --   --   --   HGB 11.6* 10.7* 11.3* 11.3* 10.0*  HCT 38.4* 35.1* 36.9* 36.6* 32.2*  MCV 93.9 92.6 91.1 90.1 89.0  PLT 211 207 220 201 188   Cardiac Enzymes: No results for input(s): CKTOTAL, CKMB, CKMBINDEX, TROPONINI in the last 168 hours. BNP (last 3 results) No results for input(s): PROBNP in the last 8760 hours. CBG: Recent Labs  Lab 03/24/18 2237 03/25/18 1334 03/25/18 1709 03/25/18 2242 03/26/18 0733  GLUCAP 135* 114* 159* 122* 115*   D-Dimer: No results for input(s): DDIMER in the last 72 hours. Hgb A1c: No results for input(s): HGBA1C in the last 72 hours. Lipid Profile: No results for input(s): CHOL, HDL, LDLCALC, TRIG, CHOLHDL, LDLDIRECT in the last 72 hours. Thyroid function studies: No results for input(s): TSH, T4TOTAL, T3FREE, THYROIDAB in the last 72 hours.  Invalid input(s): FREET3 Anemia work up: No results for input(s): VITAMINB12, FOLATE, FERRITIN, TIBC, IRON, RETICCTPCT in the last 72 hours. Sepsis  Labs: Recent Labs  Lab 03/21/18 0515 03/22/18 0905 03/23/18 0438 03/25/18 0805  WBC 6.4 6.6 7.0 6.6   Microbiology Recent Results (from the past 240 hour(s))  MRSA PCR Screening     Status: None   Collection Time: 03/22/18  6:13 AM  Result Value Ref Range Status   MRSA by PCR NEGATIVE NEGATIVE Final    Comment:        The GeneXpert MRSA Assay (FDA approved for NASAL specimens only), is one component of a comprehensive MRSA colonization surveillance  program. It is not intended to diagnose MRSA infection nor to guide or monitor treatment for MRSA infections. Performed at [facility], [address], [place], [place] [address]      Medications:   . allopurinol  100 mg Oral Daily  . carvedilol  12.5 mg Oral BID WC  . Chlorhexidine Gluconate Cloth  6 each Topical Q0600  . cinacalcet  30 mg Oral Q supper  . darbepoetin (ARANESP) injection - DIALYSIS  60 mcg Intravenous Q Sat-HD  . diltiazem  180 mg Oral Daily  . docusate sodium  100 mg Oral Daily  . doxercalciferol  3.5 mcg Intravenous Q T,Th,Sa-HD  . insulin aspart  0-9 Units Subcutaneous TID WC  . pantoprazole  40 mg Oral Daily  . pravastatin  5 mg Oral QPM  . sevelamer carbonate  1,600 mg Oral With snacks  . sevelamer carbonate  3,200 mg Oral TID WC   Continuous Infusions: .  ceFAZolin (ANCEF) IV 1 g (03/25/18 2038)      LOS: 6 days   Charlynne Cousins  [facility]  03/26/2018, 9:42 AM

## 2018-03-26 NOTE — Progress Notes (Signed)
Patient ID: Albert [name], male   DOB: 04/11/1955, 63 y.o.   MRN: [id number]  [facility] Progress Note    Subjective:   Feels well, continues to have left heel pain   Objective:   BP 102/73 (BP Location: Right Arm)   Pulse 87   Temp 98.2 F (36.8 C) (Oral)   Resp 19   Ht 6\' 2"  (1.88 m)   Wt 94.6 kg   SpO2 93%   BMI 26.78 kg/m   Intake/Output: I/O last 3 completed shifts: In: 850 [P.O.:750; IV Piggyback:100] Out: 1235 [Other:1235]   Intake/Output this shift:  No intake/output data recorded. Weight change: 1.3 kg  Physical Exam: Gen: NAD CVS: no rub Resp: cta Abd: +BS, soft, NT/ND Ext: no edema, LUE AVF +T/B, gangrenous changes to right 3rd toe and left heel/3rd toe  Labs: BMET Recent Labs  Lab 03/20/18 1931 03/21/18 0515 03/22/18 0905 03/23/18 0438 03/25/18 0805  NA 139 141 136 135 133*  K 4.9 4.8 4.3 5.3* 4.1  CL 99 102 93* 93* 93*  CO2 25 23 29 26 23   GLUCOSE 114* 122* 126* 128* 149*  BUN 82* 88* 47* 62* 63*  CREATININE 11.37* 11.53* 8.03* 10.37* 9.08*  ALBUMIN  --   --   --   --  2.1*  CALCIUM 8.4* 8.3* 8.4* 8.5* 8.2*  PHOS  --  6.8*  --   --  5.8*   CBC Recent Labs  Lab 03/20/18 1931 03/21/18 0515 03/22/18 0905 03/23/18 0438 03/25/18 0805  WBC 5.7 6.4 6.6 7.0 6.6  NEUTROABS 4.2  --   --   --   --   HGB 11.6* 10.7* 11.3* 11.3* 10.0*  HCT 38.4* 35.1* 36.9* 36.6* 32.2*  MCV 93.9 92.6 91.1 90.1 89.0  PLT 211 207 220 201 188    @IMGRELPRIORS @ Medications:    . allopurinol  100 mg Oral Daily  . carvedilol  12.5 mg Oral BID WC  . Chlorhexidine Gluconate Cloth  6 each Topical Q0600  . cinacalcet  30 mg Oral Q supper  . darbepoetin (ARANESP) injection - DIALYSIS  60 mcg Intravenous Q Sat-HD  . diltiazem  180 mg Oral Daily  . docusate sodium  100 mg Oral Daily  . doxercalciferol  3.5 mcg Intravenous Q T,Th,Sa-HD  . insulin aspart  0-9 Units Subcutaneous TID WC  . pantoprazole  40 mg Oral Daily  . pravastatin  5 mg  Oral QPM  . sevelamer carbonate  1,600 mg Oral With snacks  . sevelamer carbonate  3,200 mg Oral TID WC   TTS [facility]  Hep none  4h 81min  97kg   L BCF - hectorol 3.5ug  Assessment/ Plan:   1. Bilateral arterial insufficiency- with cellulitis and gangrenous changes.  For arteriogram this week once INR is safe for invasive procedure 2. ESRD continue with HD qTTS 3. Anemia: cont with ESA 4. CKD-MBD: continue with binders and vit D 5. Nutrition: renal diet 6. Hypertension: stable  7. DM- stable  Donetta Potts, MD [place] Pager [phone] 03/26/2018, 10:45 AM

## 2018-03-27 ENCOUNTER — Encounter (HOSPITAL_COMMUNITY)
Admission: EM | Disposition: A | Discharge status: Skilled Nursing Facility | Payer: Self-pay | Source: Home / Self Care | Attending: Internal Medicine

## 2018-03-27 DIAGNOSIS — I70245 Atherosclerosis of native arteries of left leg with ulceration of other part of foot: Secondary | ICD-10-CM

## 2018-03-27 DIAGNOSIS — I70235 Atherosclerosis of native arteries of right leg with ulceration of other part of foot: Secondary | ICD-10-CM

## 2018-03-27 DIAGNOSIS — I70234 Atherosclerosis of native arteries of right leg with ulceration of heel and midfoot: Secondary | ICD-10-CM

## 2018-03-27 HISTORY — PX: PERIPHERAL VASCULAR BALLOON ANGIOPLASTY: CATH118281

## 2018-03-27 HISTORY — PX: ABDOMINAL AORTOGRAM W/LOWER EXTREMITY: CATH118223

## 2018-03-27 LAB — PROTIME-INR
INR: 1.48
Prothrombin Time: 17.8 seconds — ABNORMAL HIGH (ref 11.4–15.2)

## 2018-03-27 LAB — GLUCOSE, CAPILLARY
Glucose-Capillary: 135 mg/dL — ABNORMAL HIGH (ref 70–99)
Glucose-Capillary: 150 mg/dL — ABNORMAL HIGH (ref 70–99)
Glucose-Capillary: 202 mg/dL — ABNORMAL HIGH (ref 70–99)
Glucose-Capillary: 109 mg/dL — ABNORMAL HIGH (ref 70–99)

## 2018-03-27 LAB — SURGICAL PCR SCREEN
MRSA, PCR: NEGATIVE
Staphylococcus aureus: NEGATIVE

## 2018-03-27 SURGERY — ABDOMINAL AORTOGRAM W/LOWER EXTREMITY
Anesthesia: LOCAL

## 2018-03-27 MED ORDER — SODIUM CHLORIDE 0.9 % IV SOLN
INTRAVENOUS | Status: DC
  Administered 2018-03-27 – 2018-03-29 (×3): via INTRAVENOUS

## 2018-03-27 MED ORDER — SODIUM CHLORIDE 0.9% FLUSH: 3.0000 mL | INTRAVENOUS | Status: DC | PRN

## 2018-03-27 MED ORDER — SODIUM CHLORIDE 0.9 % IV SOLN
250.0000 mL | INTRAVENOUS | Status: DC | PRN
  Administered 2018-04-08 (×2): via INTRAVENOUS

## 2018-03-27 MED ORDER — FENTANYL CITRATE (PF) 100 MCG/2ML IJ SOLN
INTRAMUSCULAR | Status: DC | PRN
  Administered 2018-03-27: 50 ug via INTRAVENOUS

## 2018-03-27 MED ORDER — ASPIRIN 81 MG PO CHEW
CHEWABLE_TABLET | ORAL | Status: AC
  Filled 2018-03-27: qty 1

## 2018-03-27 MED ORDER — ACETAMINOPHEN 325 MG PO TABS
650.0000 mg | ORAL_TABLET | ORAL | Status: DC | PRN
  Administered 2018-04-03: 650 mg via ORAL
  Filled 2018-03-27: qty 2

## 2018-03-27 MED ORDER — ASPIRIN EC 81 MG PO TBEC
81.0000 mg | DELAYED_RELEASE_TABLET | Freq: Every day | ORAL | Status: DC
  Administered 2018-03-27 – 2018-04-14 (×19): 81 mg via ORAL
  Filled 2018-03-27 (×21): qty 1

## 2018-03-27 MED ORDER — FENTANYL CITRATE (PF) 100 MCG/2ML IJ SOLN
INTRAMUSCULAR | Status: AC
  Filled 2018-03-27: qty 2

## 2018-03-27 MED ORDER — OXYCODONE HCL 5 MG PO TABS
5.0000 mg | ORAL_TABLET | ORAL | Status: DC | PRN
  Administered 2018-03-28: 5 mg via ORAL
  Administered 2018-03-29 – 2018-04-12 (×16): 10 mg via ORAL
  Administered 2018-04-13 (×2): 5 mg via ORAL
  Administered 2018-04-14: 10 mg via ORAL
  Filled 2018-03-27 (×7): qty 2
  Filled 2018-03-27: qty 1
  Filled 2018-03-27: qty 2
  Filled 2018-03-27: qty 1
  Filled 2018-03-27 (×7): qty 2

## 2018-03-27 MED ORDER — HEPARIN (PORCINE) IN NACL 1000-0.9 UT/500ML-% IV SOLN
INTRAVENOUS | Status: DC | PRN
  Administered 2018-03-27: 500 mL

## 2018-03-27 MED ORDER — SODIUM CHLORIDE 0.9% FLUSH
3.0000 mL | Freq: Two times a day (BID) | INTRAVENOUS | Status: DC
  Administered 2018-03-28 – 2018-04-12 (×22): 3 mL via INTRAVENOUS

## 2018-03-27 MED ORDER — ONDANSETRON HCL 4 MG/2ML IJ SOLN: 4.0000 mg | Freq: Four times a day (QID) | INTRAMUSCULAR | Status: DC | PRN

## 2018-03-27 MED ORDER — LIDOCAINE HCL (PF) 1 % IJ SOLN
INTRAMUSCULAR | Status: DC | PRN
  Administered 2018-03-27: 18 mL

## 2018-03-27 MED ORDER — HEPARIN SODIUM (PORCINE) 1000 UNIT/ML IJ SOLN
INTRAMUSCULAR | Status: DC | PRN
  Administered 2018-03-27: 10000 [IU] via INTRAVENOUS

## 2018-03-27 MED ORDER — LIDOCAINE HCL (PF) 1 % IJ SOLN
INTRAMUSCULAR | Status: AC
  Filled 2018-03-27: qty 30

## 2018-03-27 MED ORDER — MIDAZOLAM HCL 2 MG/2ML IJ SOLN
INTRAMUSCULAR | Status: AC
  Filled 2018-03-27: qty 2

## 2018-03-27 MED ORDER — SODIUM CHLORIDE 0.9 % IV SOLN
INTRAVENOUS | Status: AC | PRN
  Administered 2018-03-27: 10 mL/h via INTRAVENOUS

## 2018-03-27 MED ORDER — HYDRALAZINE HCL 20 MG/ML IJ SOLN: 5.0000 mg | INTRAMUSCULAR | Status: DC | PRN

## 2018-03-27 MED ORDER — LABETALOL HCL 5 MG/ML IV SOLN: 10.0000 mg | INTRAVENOUS | Status: DC | PRN

## 2018-03-27 MED ORDER — HEPARIN (PORCINE) IN NACL 1000-0.9 UT/500ML-% IV SOLN
INTRAVENOUS | Status: AC
  Filled 2018-03-27: qty 1000

## 2018-03-27 MED ORDER — MIDAZOLAM HCL 2 MG/2ML IJ SOLN
INTRAMUSCULAR | Status: DC | PRN
  Administered 2018-03-27: 1 mg via INTRAVENOUS

## 2018-03-27 MED ORDER — IOHEXOL 350 MG/ML SOLN
INTRAVENOUS | Status: DC | PRN
  Administered 2018-03-27: 175 mL via INTRA_ARTERIAL

## 2018-03-27 SURGICAL SUPPLY — 22 items
BALLN COYOTE OTW 2.5X100X150 (BALLOONS) ×3
BALLN STERLING OTW 3X100X150 (BALLOONS) ×3
BALLOON COYOTE OTW 2.5X100X150 (BALLOONS) IMPLANT
BALLOON STERLING OTW 3X100X150 (BALLOONS) IMPLANT
CATH OMNI FLUSH 5F 65CM (CATHETERS) ×1 IMPLANT
CATH QUICKCROSS ANG SELECT (CATHETERS) ×1 IMPLANT
CATH QUICKCROSS SUPP .035X90CM (MICROCATHETER) ×1 IMPLANT
CLOSURE MYNX CONTROL 6F/7F (Vascular Products) ×1 IMPLANT
GLIDEWIRE ADV .035X260CM (WIRE) ×1 IMPLANT
KIT ENCORE 26 ADVANTAGE (KITS) ×1 IMPLANT
KIT MICROPUNCTURE NIT STIFF (SHEATH) ×1 IMPLANT
KIT PV (KITS) ×3 IMPLANT
SHEATH HIGHFLEX ANSEL 6FRX55 (SHEATH) ×1 IMPLANT
SHEATH PINNACLE 5F 10CM (SHEATH) ×1 IMPLANT
SHEATH PINNACLE 6F 10CM (SHEATH) ×1 IMPLANT
SHEATH PROBE COVER 6X72 (BAG) ×1 IMPLANT
SYR MEDRAD MARK V 150ML (SYRINGE) ×1 IMPLANT
TRANSDUCER W/STOPCOCK (MISCELLANEOUS) ×3 IMPLANT
TRAY PV CATH (CUSTOM PROCEDURE TRAY) ×3 IMPLANT
WIRE BENTSON .035X145CM (WIRE) ×1 IMPLANT
WIRE G V18X300CM (WIRE) ×2 IMPLANT
WIRE SPARTACORE .014X300CM (WIRE) ×1 IMPLANT

## 2018-03-27 NOTE — Progress Notes (Signed)
  Progress Note    03/27/2018 10:30 AM Day of Surgery  Subjective:  No overnight issues  Vitals:   03/27/18 0714 03/27/18 1018  BP: (!) 88/63   Pulse: 100   Resp: 19   Temp: (!) 97.5 F (36.4 C)   SpO2: 100% 99%    Physical Exam: General:  nad HENT: WNL Pulmonary: normal non-labored breathing Cardiac: Palpable popliteal pulses bilaterally, I cannot palpate any tibial pulses Abdomen: soft, NT/ND, no masses Extremities: Right second toe dry gangrene, left second toe tip ulceration and heel has a area of superficial necrosis Neurologic: A&O X 3; Appropriate Affect ; SENSATION: normal; MOTOR FUNCTION:  moving all extremities equally. Speech is fluent/normal  CBC    Component Value Date/Time   WBC 6.6 03/25/2018 0805   RBC 3.62 (L) 03/25/2018 0805   HGB 10.0 (L) 03/25/2018 0805   HCT 32.2 (L) 03/25/2018 0805   PLT 188 03/25/2018 0805   MCV 89.0 03/25/2018 0805   MCH 27.6 03/25/2018 0805   MCHC 31.1 03/25/2018 0805   RDW 14.3 03/25/2018 0805   LYMPHSABS 0.7 03/20/2018 1931   MONOABS 0.7 03/20/2018 1931   EOSABS 0.1 03/20/2018 1931   BASOSABS 0.0 03/20/2018 1931    BMET    Component Value Date/Time   NA 133 (L) 03/25/2018 0805   K 4.1 03/25/2018 0805   CL 93 (L) 03/25/2018 0805   CO2 23 03/25/2018 0805   GLUCOSE 149 (H) 03/25/2018 0805   BUN 63 (H) 03/25/2018 0805   CREATININE 9.08 (H) 03/25/2018 0805   CALCIUM 8.2 (L) 03/25/2018 0805   CALCIUM 9.0 03/21/2007 0415   GFRNONAA 5 (L) 03/25/2018 0805   GFRAA 6 (L) 03/25/2018 0805    INR    Component Value Date/Time   INR 1.48 03/27/2018 0520     Intake/Output Summary (Last 24 hours) at 03/27/2018 1030 Last data filed at 03/27/2018 0944 Gross per 24 hour  Intake 740 ml  Output 0 ml  Net 740 ml     Assessment:  63 y.o. male is here with bilateral toe ulceration.  Plan: Angiogram today from right common femoral approach.  He will need a repeat intervention for the right leg as wel and as well as toe  amputations in the near future   Avella C. Donzetta Matters, MD [facility] Office: [phone] Pager: [phone]  03/27/2018 10:30 AM

## 2018-03-27 NOTE — Progress Notes (Signed)
[facility] PROGRESS NOTE    Progress Note  Albert [name]  WOE:[id number] DOB: 1955/08/17 DOA: 03/20/2018 PCP: Redmond School, MD     Brief Narrative:   Albert [name] is an 63 y.o. male past medical history of end-stage renal disease on dialysis, atrial fibrillation on Coumadin comes in with left heel cellulitis and necrotic second great right toe ABIs performed that showed compromised blood flow bilaterally  Assessment/Plan:   Cellulitis of left lower extremity/dry gangrenous right second toe severe bilateral peripheral vascular disease: - Transfer from [facility] for arteriogram. - ABI's show severe bilateral extremity arterial occlusive disease. - cont. IV Ancef, he has remained afebrile no leukocytosis. - Arteriogram on 03/27/2018, NR is 1.4.  End renal disease on dialysis: Renal following for HD per renal.  He dialyzes Tuesday Thursdays and Saturdays.  Chronic atrial fibrillation: Heart rate is controlled on Coreg and diltiazem below 1.5.  For arteriogram today. [place] vasc score 2. Continue to hold anticoagulation.  Diabetes mellitus type II: Good control on sliding scale insulin continue current management.    DVT prophylaxis: INR therapeutic Family Communication:none Disposition Plan/Barrier to D/C: unable to determine Code Status:     Code Status Orders  (From admission, onward)         Start     Ordered   03/20/18 2128  Full code  Continuous     03/20/18 2130        Code Status History    Date Active Date Inactive Code Status Order ID Comments User Context   01/21/2017 1655 01/22/2017 2006 Full Code [id number]  [name], MD Inpatient   01/03/2017 2148 01/06/2017 2047 Full Code [id number]  [name], MD Inpatient   10/12/2013 0341 10/20/2013 1428 Full Code [id number]  [name], MD Inpatient    Advance Directive Documentation     Most Recent Value  Type of Advance Directive  Living will  Pre-existing out of  facility DNR order (yellow form or pink MOST form)  -  "MOST" Form in Place?  -        IV Access:    Peripheral IV   Procedures and diagnostic studies:   No results found.   Medical Consultants:    None.  Anti-Infectives:   IV ancef  Subjective:    [name] no complains feels great.  Objective:    Vitals:   03/26/18 1641 03/26/18 2028 03/27/18 0527 03/27/18 0714  BP: (!) 99/59 102/72 97/64 (!) 88/63  Pulse: 91 90 84 100  Resp: 18 17 17 19   Temp: 98.3 F (36.8 C) 98.5 F (36.9 C) (!) 97.5 F (36.4 C) (!) 97.5 F (36.4 C)  TempSrc: Oral Oral Oral Oral  SpO2: 100% 97% 100% 100%  Weight:  94.7 kg    Height:        Intake/Output Summary (Last 24 hours) at 03/27/2018 0950 Last data filed at 03/27/2018 0944 Gross per 24 hour  Intake 1200 ml  Output 0 ml  Net 1200 ml   Filed Weights   03/25/18 1207 03/25/18 2030 03/26/18 2028  Weight: 94.8 kg 94.6 kg 94.7 kg    Exam: General exam: In no acute distress. Respiratory system: Good air movement and clear to auscultation. Cardiovascular system: S1 & S2 heard, RRR. No JVD. Gastrointestinal system: Abdomen is nondistended, soft and nontender.  Central nervous system: Alert and oriented. No focal neurological deficits. Extremities: No pedal edema. Skin: No rashes, lesions or ulcers Psychiatry: Judgement and insight appear  normal. Mood & affect appropriate.    Data Reviewed:    Labs: Basic Metabolic Panel: Recent Labs  Lab 03/20/18 1931 03/21/18 0515 03/22/18 0905 03/23/18 0438 03/25/18 0805  NA 139 141 136 135 133*  K 4.9 4.8 4.3 5.3* 4.1  CL 99 102 93* 93* 93*  CO2 25 23 29 26 23   GLUCOSE 114* 122* 126* 128* 149*  BUN 82* 88* 47* 62* 63*  CREATININE 11.37* 11.53* 8.03* 10.37* 9.08*  CALCIUM 8.4* 8.3* 8.4* 8.5* 8.2*  MG  --  2.1  --   --   --   PHOS  --  6.8*  --   --  5.8*   GFR Estimated Creatinine Clearance: 9.6 mL/min (A) (by C-G formula based on SCr of 9.08 mg/dL  (H)). Liver Function Tests: Recent Labs  Lab 03/25/18 0805  ALBUMIN 2.1*   No results for input(s): LIPASE, AMYLASE in the last 168 hours. No results for input(s): AMMONIA in the last 168 hours. Coagulation profile Recent Labs  Lab 03/23/18 0438 03/24/18 0452 03/25/18 0427 03/26/18 0549 03/27/18 0520  INR 3.94 4.16* 4.31* 1.63 1.48    CBC: Recent Labs  Lab 03/20/18 1931 03/21/18 0515 03/22/18 0905 03/23/18 0438 03/25/18 0805  WBC 5.7 6.4 6.6 7.0 6.6  NEUTROABS 4.2  --   --   --   --   HGB 11.6* 10.7* 11.3* 11.3* 10.0*  HCT 38.4* 35.1* 36.9* 36.6* 32.2*  MCV 93.9 92.6 91.1 90.1 89.0  PLT 211 207 220 201 188   Cardiac Enzymes: No results for input(s): CKTOTAL, CKMB, CKMBINDEX, TROPONINI in the last 168 hours. BNP (last 3 results) No results for input(s): PROBNP in the last 8760 hours. CBG: Recent Labs  Lab 03/26/18 0733 03/26/18 1115 03/26/18 1641 03/26/18 2028 03/27/18 0712  GLUCAP 115* 163* 94 163* 135*   D-Dimer: No results for input(s): DDIMER in the last 72 hours. Hgb A1c: No results for input(s): HGBA1C in the last 72 hours. Lipid Profile: No results for input(s): CHOL, HDL, LDLCALC, TRIG, CHOLHDL, LDLDIRECT in the last 72 hours. Thyroid function studies: No results for input(s): TSH, T4TOTAL, T3FREE, THYROIDAB in the last 72 hours.  Invalid input(s): FREET3 Anemia work up: No results for input(s): VITAMINB12, FOLATE, FERRITIN, TIBC, IRON, RETICCTPCT in the last 72 hours. Sepsis Labs: Recent Labs  Lab 03/21/18 0515 03/22/18 0905 03/23/18 0438 03/25/18 0805  WBC 6.4 6.6 7.0 6.6   Microbiology Recent Results (from the past 240 hour(s))  MRSA PCR Screening     Status: None   Collection Time: 03/22/18  6:13 AM  Result Value Ref Range Status   MRSA by PCR NEGATIVE NEGATIVE Final    Comment:        The GeneXpert MRSA Assay (FDA approved for NASAL specimens only), is one component of a comprehensive MRSA colonization surveillance program.  It is not intended to diagnose MRSA infection nor to guide or monitor treatment for MRSA infections. Performed at [facility], [address], [place], [place] [address]   Surgical pcr screen     Status: None   Collection Time: 03/27/18  5:23 AM  Result Value Ref Range Status   MRSA, PCR NEGATIVE NEGATIVE Final   Staphylococcus aureus NEGATIVE NEGATIVE Final    Comment: (NOTE) The Xpert SA Assay (FDA approved for NASAL specimens in patients 68 years of age and older), is one component of a comprehensive surveillance program. It is not intended to diagnose infection nor to guide or monitor treatment. Performed at  [facility], [place] [address] [place], [place] [address]      Medications:   . allopurinol  100 mg Oral Daily  . carvedilol  12.5 mg Oral BID WC  . Chlorhexidine Gluconate Cloth  6 each Topical Q0600  . cinacalcet  30 mg Oral Q supper  . darbepoetin (ARANESP) injection - DIALYSIS  60 mcg Intravenous Q Sat-HD  . diltiazem  180 mg Oral Daily  . docusate sodium  100 mg Oral Daily  . doxercalciferol  3.5 mcg Intravenous Q T,Th,Sa-HD  . insulin aspart  0-9 Units Subcutaneous TID WC  . pantoprazole  40 mg Oral Daily  . pravastatin  5 mg Oral QPM  . sevelamer carbonate  1,600 mg Oral With snacks  . sevelamer carbonate  3,200 mg Oral TID WC   Continuous Infusions: . sodium chloride    .  ceFAZolin (ANCEF) IV 1 g (03/26/18 2223)      LOS: 7 days   Charlynne Cousins  [facility]  03/27/2018, 9:50 AM

## 2018-03-27 NOTE — Progress Notes (Signed)
Patient ID: Albert [name], male   DOB: 12-10-1955, 63 y.o.   MRN: [id number]  [facility] Progress Note    Subjective:    S/p angio- needs to have one toe on each foot amputated.  Appears to be scheduled 2/5.  Sleepy this evening, watching Jeopardy.   Objective:   BP 110/68 (BP Location: Right Arm)   Pulse (!) 106   Temp (!) 97.1 F (36.2 C) (Oral)   Resp 20   Ht 6\' 2"  (1.88 m)   Wt 94.7 kg   SpO2 95%   BMI 26.81 kg/m   Intake/Output: I/O last 3 completed shifts: In: 1200 [P.O.:1150; IV Piggyback:50] Out: 0    Intake/Output this shift:  No intake/output data recorded. Weight change: -2.8 kg  Physical Exam: Gen: NAD, sleeping and easily arousable CVS: tachycardic, irregular, no rub Resp: clear bilaterally no c/w/r Abd: +BS, soft, NT/ND Ext: no edema, LUE AVF +T/B, gangrenous changes to right 3rd toe and left heel/3rd toe  Labs: BMET Recent Labs  Lab 03/21/18 0515 03/22/18 0905 03/23/18 0438 03/25/18 0805  NA 141 136 135 133*  K 4.8 4.3 5.3* 4.1  CL 102 93* 93* 93*  CO2 23 29 26 23   GLUCOSE 122* 126* 128* 149*  BUN 88* 47* 62* 63*  CREATININE 11.53* 8.03* 10.37* 9.08*  ALBUMIN  --   --   --  2.1*  CALCIUM 8.3* 8.4* 8.5* 8.2*  PHOS 6.8*  --   --  5.8*   CBC Recent Labs  Lab 03/21/18 0515 03/22/18 0905 03/23/18 0438 03/25/18 0805  WBC 6.4 6.6 7.0 6.6  HGB 10.7* 11.3* 11.3* 10.0*  HCT 35.1* 36.9* 36.6* 32.2*  MCV 92.6 91.1 90.1 89.0  PLT 207 220 201 188    @IMGRELPRIORS @ Medications:    . allopurinol  100 mg Oral Daily  . aspirin EC  81 mg Oral Daily  . carvedilol  12.5 mg Oral BID WC  . Chlorhexidine Gluconate Cloth  6 each Topical Q0600  . cinacalcet  30 mg Oral Q supper  . darbepoetin (ARANESP) injection - DIALYSIS  60 mcg Intravenous Q Sat-HD  . diltiazem  180 mg Oral Daily  . docusate sodium  100 mg Oral Daily  . doxercalciferol  3.5 mcg Intravenous Q T,Th,Sa-HD  . insulin aspart  0-9 Units Subcutaneous TID WC  .  pantoprazole  40 mg Oral Daily  . pravastatin  5 mg Oral QPM  . sevelamer carbonate  1,600 mg Oral With snacks  . sevelamer carbonate  3,200 mg Oral TID WC  . sodium chloride flush  3 mL Intravenous Q12H   TTS [facility]  Hep none  4h 24min  97kg   L BCF - hectorol 3.5ug  Assessment/ Plan:   1. Bilateral arterial insufficiency- with cellulitis and gangrenous changes.  S/p angio, for toe amputations 2/5. 2. ESRD continue with HD qTTS, next planned for tomorrow 3. Anemia: cont with ESA 4. CKD-MBD: continue with binders and vit D 5. Nutrition: renal diet 6. Hypertension: stable  7. DM- stable  Madelon Lips MD [facility] pgr [phone] 03/27/2018, 8:04 PM

## 2018-03-27 NOTE — Op Note (Signed)
Patient name: Albert [name] MRN: [id number] DOB: 23-Jun-1955 Sex: male  03/27/2018 Pre-operative Diagnosis: End-stage renal disease, bilateral toe ulceration with right heel ulceration Post-operative diagnosis:  Same Surgeon:  Eda Paschal. Donzetta Matters, MD Procedure Performed: 1.  Ultrasound-guided cannulation right common femoral artery 2.  Aortogram bilateral lower extremity runoff 3.  Balloon angioplasty of left posterior tibial artery with 2.5 and 3 mm balloons 4.  Mynx device closure right common femoral artery 5.  Moderate sedation with fentanyl Versed for 73 minutes   Indications: 63 year old male presents with bilateral toe ulceration.  Left side he also has heel ulceration.  He is indicated for angiogram possible intervention.  Findings: His aorta and iliac segments are heavily calcified and tortuous however there is no flow-limiting stenosis.  SFAs are heavily calcified no flow-limiting stenosis.  Right side appears to have runoff via the posterior tibial and anterior tibial arteries without flow-limiting stenosis.  Left side has runoff via the anterior tibial and peroneal posterior tibial occludes above the ankle.  After intervention there is less than 30% residual stenosis and a strong signal at the ankle.  Patient will need amputation of bilateral second toes at least and we will watch the ulceration of his heel on the left but he remains high risk for proximal leg amputations bilaterally.   Procedure:  The patient was identified in the holding area and taken to room 8.  The patient was then placed supine on the table and prepped and draped in the usual sterile fashion.  A time out was called.  Ultrasound was used to evaluate the right common femoral artery which was noted to be free of disease.  There is anesthetized 1% lidocaine cannulated with direct ultrasound visualization a micropuncture needle followed by wire and sheath.  An image was over the permanent record.  We placed a  Bentson wire followed by 5 French sheath Omni catheter level L1 performed aortogram.  Then performed bilateral lower extremity runoff.  We would later perform right lower extremity runoff with retrograde sheath angiogram on the right.  We then crossed the bifurcation using combination of Omni catheter as well as quick cross and Glidewire advantage.  We then placed a long 6 French sheath into the left SFA the patient was fully heparinized.  We is V 18 and quick cross leg catheter to get into the posterior tibial artery.  We ultimately had to place an 014 wire down to the level of the ankle.  We initially ballooned with a 3 mm balloon but would not pass all the way to the ankle.  This was a 3 x 100 proximal in the posterior tibial artery.  We then exchanged for a 2.5 x 100 mm balloon and this was dilated.  Both of these were nominal pressure for 2 minutes.  Completion angiogram was very hard to tell the residual stenosis but the posterior tibial had much improved flow despite having a wire in it.  I would guess this to be less than 30% residual stenosis were once we were occluded.  Satisfied with this we removed our catheters and balloons and wires.  I had elected not to perform atherectomy given the tortuosity of the iliac arteries as well as the tortuosity of the left posterior tibial artery.  We performed retrograde sheath angiogram on the right lower extremity.  No intervention will need to be undertaken on that side and he will need to amputations bilaterally.  He tolerated procedure without immediate complication.  Contrast: 175  cc   Ryelynn Guedea C. Donzetta Matters, MD [facility] [place] Office: [phone] Pager: [phone]

## 2018-03-28 ENCOUNTER — Encounter (HOSPITAL_COMMUNITY): Payer: Self-pay | Admitting: Vascular Surgery

## 2018-03-28 LAB — BASIC METABOLIC PANEL
ANION GAP: 17 — AB (ref 5–15)
BUN: 75 mg/dL — ABNORMAL HIGH (ref 8–23)
CO2: 25 mmol/L (ref 22–32)
Calcium: 8.4 mg/dL — ABNORMAL LOW (ref 8.9–10.3)
Chloride: 91 mmol/L — ABNORMAL LOW (ref 98–111)
Creatinine, Ser: 10.92 mg/dL — ABNORMAL HIGH (ref 0.61–1.24)
GFR calc Af Amer: 5 mL/min — ABNORMAL LOW (ref >60)
GFR calc non Af Amer: 4 mL/min — ABNORMAL LOW (ref >60)
Glucose, Bld: 157 mg/dL — ABNORMAL HIGH (ref 70–99)
Potassium: 4.6 mmol/L (ref 3.5–5.1)
Sodium: 133 mmol/L — ABNORMAL LOW (ref 135–145)

## 2018-03-28 LAB — CBC
HCT: 31 % — ABNORMAL LOW (ref 39.0–52.0)
Hemoglobin: 9.9 g/dL — ABNORMAL LOW (ref 13.0–17.0)
MCH: 27.9 pg (ref 26.0–34.0)
MCHC: 31.9 g/dL (ref 30.0–36.0)
MCV: 87.3 fL (ref 80.0–100.0)
Platelets: 197 10*3/uL (ref 150–400)
RBC: 3.55 MIL/uL — ABNORMAL LOW (ref 4.22–5.81)
RDW: 14 % (ref 11.5–15.5)
WBC: 7.7 10*3/uL (ref 4.0–10.5)
nRBC: 0 % (ref 0.0–0.2)

## 2018-03-28 LAB — PROTIME-INR
INR: 1.69
Prothrombin Time: 19.7 seconds — ABNORMAL HIGH (ref 11.4–15.2)

## 2018-03-28 LAB — HEPATITIS B E ANTIBODY: Hep B E Ab: NEGATIVE

## 2018-03-28 LAB — GLUCOSE, CAPILLARY
Glucose-Capillary: 136 mg/dL — ABNORMAL HIGH (ref 70–99)
Glucose-Capillary: 129 mg/dL — ABNORMAL HIGH (ref 70–99)
Glucose-Capillary: 137 mg/dL — ABNORMAL HIGH (ref 70–99)
Glucose-Capillary: 150 mg/dL — ABNORMAL HIGH (ref 70–99)

## 2018-03-28 MED ORDER — HYDROCODONE-ACETAMINOPHEN 5-325 MG PO TABS
ORAL_TABLET | ORAL | Status: AC
  Filled 2018-03-28: qty 2

## 2018-03-28 MED ORDER — DOXERCALCIFEROL 4 MCG/2ML IV SOLN
INTRAVENOUS | Status: AC
  Administered 2018-03-28: 3.5 ug via INTRAVENOUS
  Filled 2018-03-28: qty 2

## 2018-03-28 NOTE — H&P (View-Only) (Signed)
  Progress Note    03/28/2018 7:40 AM 1 Day Post-Op  Subjective:  No complaints  Afebrile   Vitals:   03/28/18 0531 03/28/18 0552  BP: 104/62   Pulse: 86   Resp: (!) 23   Temp: 97.8 F (36.6 C)   SpO2:  93%    Physical Exam: General:  Sitting up in bed eating breakfast Lungs:  Non labored Incisions:  Right groin soft without hematoma Extremities:  +doppler signals left AT/peroneal and right AT   CBC    Component Value Date/Time   WBC 7.7 03/28/2018 0347   RBC 3.55 (L) 03/28/2018 0347   HGB 9.9 (L) 03/28/2018 0347   HCT 31.0 (L) 03/28/2018 0347   PLT 197 03/28/2018 0347   MCV 87.3 03/28/2018 0347   MCH 27.9 03/28/2018 0347   MCHC 31.9 03/28/2018 0347   RDW 14.0 03/28/2018 0347   LYMPHSABS 0.7 03/20/2018 1931   MONOABS 0.7 03/20/2018 1931   EOSABS 0.1 03/20/2018 1931   BASOSABS 0.0 03/20/2018 1931    BMET    Component Value Date/Time   NA 133 (L) 03/28/2018 0347   K 4.6 03/28/2018 0347   CL 91 (L) 03/28/2018 0347   CO2 25 03/28/2018 0347   GLUCOSE 157 (H) 03/28/2018 0347   BUN 75 (H) 03/28/2018 0347   CREATININE 10.92 (H) 03/28/2018 0347   CALCIUM 8.4 (L) 03/28/2018 0347   CALCIUM 9.0 03/21/2007 0415   GFRNONAA 4 (L) 03/28/2018 0347   GFRAA 5 (L) 03/28/2018 0347    INR    Component Value Date/Time   INR 1.69 03/28/2018 0347     Intake/Output Summary (Last 24 hours) at 03/28/2018 0740 Last data filed at 03/28/2018 0200 Gross per 24 hour  Intake 300 ml  Output -  Net 300 ml     Assessment:  63 y.o. male is s/p:  1.  Ultrasound-guided cannulation right common femoral artery 2.  Aortogram bilateral lower extremity runoff 3.  Balloon angioplasty of left posterior tibial artery with 2.5 and 3 mm balloons 4.  Mynx device closure right common femoral artery 5.  Moderate sedation with fentanyl Versed for 73 minutes  1 Day Post-Op  Plan: -pt with doppler signals bilaterally -pt for OR tomorrow; pt made npo, but will defer consent to Dr. Donzetta Matters for  exact procedure.  Pt is pre-op'd otherwise.  -INR is 1.69 -float left heel   Leontine Locket, PA-C [facility] [phone] 03/28/2018 7:40 AM  I have independently interviewed and examined patient and agree with PA assessment and plan above. Plan for bilateral second toes for gangrene. Remains high risk for transtibial amputation.  Keirra Zeimet C. Donzetta Matters, MD [facility] [place] Office: [phone] Pager: [phone]

## 2018-03-28 NOTE — Consult Note (Signed)
   [facility] CM Inpatient Consult   03/28/2018  Albert [name] 1955-07-29 [id number]    Patient screened for potential [facility] Care Management services due to unplanned readmission risk score of 24% (high).  Mr. Favia is currently in HD. Will follow up at a later time to discuss potential [facility] Management services.  Marthenia Rolling, MSN-Ed, RN,BSN [facility] Liaison [phone]

## 2018-03-28 NOTE — Care Management Important Message (Signed)
Important Message  Patient Details  Name: Albert [name] MRN: [id number] Date of Birth: 09/03/1955   Medicare Important Message Given:  Yes    Cylah Fannin P Janat Tabbert 03/28/2018, 5:01 PM

## 2018-03-28 NOTE — Plan of Care (Signed)

## 2018-03-28 NOTE — Progress Notes (Signed)
Patient ID: Albert [name], male   DOB: [birth year], 63 y.o.   MRN: [id number]  [facility] Progress Note    Subjective:    On HD.  Doing well.  Had some foot pain initially but this is resolved.   Objective:   BP 114/72   Pulse (!) 109   Temp 98.5 F (36.9 C) (Oral)   Resp 17   Ht 6\' 2"  (1.88 m)   Wt 97.3 kg   SpO2 99%   BMI 27.54 kg/m   Intake/Output: I/O last 3 completed shifts: In: 650 [P.O.:500; IV Piggyback:150] Out: 0    Intake/Output this shift:  Total I/O In: 240 [P.O.:240] Out: -  Weight change: 2.3 kg  Physical Exam: Gen: NAD, sleeping and easily arousable CVS: tachycardic, irregular, no rub Resp: clear bilaterally no c/w/r Abd: +BS, soft, NT/ND Ext: no edema, LUE AVF +T/B, gangrenous changes to right 3rd toe and left heel/3rd toe  Labs: BMET Recent Labs  Lab 03/22/18 0905 03/23/18 0438 03/25/18 0805 03/28/18 0347  NA 136 135 133* 133*  K 4.3 5.3* 4.1 4.6  CL 93* 93* 93* 91*  CO2 29 26 23 25   GLUCOSE 126* 128* 149* 157*  BUN 47* 62* 63* 75*  CREATININE 8.03* 10.37* 9.08* 10.92*  ALBUMIN  --   --  2.1*  --   CALCIUM 8.4* 8.5* 8.2* 8.4*  PHOS  --   --  5.8*  --    CBC Recent Labs  Lab 03/22/18 0905 03/23/18 0438 03/25/18 0805 03/28/18 0347  WBC 6.6 7.0 6.6 7.7  HGB 11.3* 11.3* 10.0* 9.9*  HCT 36.9* 36.6* 32.2* 31.0*  MCV 91.1 90.1 89.0 87.3  PLT 220 201 188 197    @IMGRELPRIORS @ Medications:    . allopurinol  100 mg Oral Daily  . aspirin EC  81 mg Oral Daily  . carvedilol  12.5 mg Oral BID WC  . Chlorhexidine Gluconate Cloth  6 each Topical Q0600  . cinacalcet  30 mg Oral Q supper  . darbepoetin (ARANESP) injection - DIALYSIS  60 mcg Intravenous Q Sat-HD  . diltiazem  180 mg Oral Daily  . docusate sodium  100 mg Oral Daily  . doxercalciferol  3.5 mcg Intravenous Q T,Th,Sa-HD  . insulin aspart  0-9 Units Subcutaneous TID WC  . pantoprazole  40 mg Oral Daily  . pravastatin  5 mg Oral QPM  . sevelamer  carbonate  1,600 mg Oral With snacks  . sevelamer carbonate  3,200 mg Oral TID WC  . sodium chloride flush  3 mL Intravenous Q12H   TTS [facility]  Hep none  4h 53min  97kg   L BCF - hectorol 3.5ug  Assessment/ Plan:   1. Bilateral arterial insufficiency- with cellulitis and gangrenous changes.  S/p angio, for toe amputations 2/5. 2. ESRD continue with HD qTTS, on HD- doing well 3. Anemia: cont with ESA 4. CKD-MBD: continue with binders and vit D 5. Nutrition: renal diet 6. Hypertension: stable  7. DM- stable  Madelon Lips MD [facility] pgr [phone] 03/28/2018, 4:40 PM

## 2018-03-28 NOTE — Progress Notes (Signed)
[facility] PROGRESS NOTE    Progress Note  Albert [name]  LEX:[id number] DOB: 1955/08/18 DOA: 03/20/2018 PCP: Redmond School, MD     Brief Narrative:   Albert [name] is an 63 y.o. male past medical history of end-stage renal disease on dialysis, atrial fibrillation on Coumadin comes in with left heel cellulitis and necrotic second great right toe ABIs performed that showed compromised blood flow bilaterally  Assessment/Plan:   Cellulitis of left lower extremity/dry gangrenous right second toe severe bilateral peripheral vascular disease: - Transfer from [facility] for arteriogram. - Bilateral angiogram done on 03/28/2018 with balloon angioplasty of the left posterior tibial artery - Cont. IV Ancef, he has remained afebrile no leukocytosis. - For bilateral second toe amputation, for his dry gangrene.  End renal disease on dialysis: Renal following for HD per renal.  He dialyzes Tuesday Thursdays and Saturdays.  Chronic atrial fibrillation: Continue Coreg and diltiazem, INR Heart rate controlled. [place] vasc score 2. Continue to hold anticoagulation.  Diabetes mellitus type II: Good control on sliding scale insulin continue current management.    DVT prophylaxis: INR Subtherapeutic Family Communication:none Disposition Plan/Barrier to D/C: unable to determine Code Status:     Code Status Orders  (From admission, onward)         Start     Ordered   03/20/18 2128  Full code  Continuous     03/20/18 2130        Code Status History    Date Active Date Inactive Code Status Order ID Comments User Context   01/21/2017 1655 01/22/2017 2006 Full Code [id number]  [name], MD Inpatient   01/03/2017 2148 01/06/2017 2047 Full Code [id number]  [name], MD Inpatient   10/12/2013 0341 10/20/2013 1428 Full Code [id number]  [name], MD Inpatient    Advance Directive Documentation     Most Recent Value  Type of Advance Directive   Living will  Pre-existing out of facility DNR order (yellow form or pink MOST form)  -  "MOST" Form in Place?  -        IV Access:    Peripheral IV   Procedures and diagnostic studies:   No results found.   Medical Consultants:    None.  Anti-Infectives:   IV ancef  Subjective:    Albert [name] no complains feels great.  Objective:    Vitals:   03/27/18 1900 03/28/18 0500 03/28/18 0531 03/28/18 0552  BP: 110/68  104/62   Pulse:   86   Resp: 20  (!) 23   Temp: (!) 97.1 F (36.2 C)  97.8 F (36.6 C)   TempSrc: Oral  Oral   SpO2:    93%  Weight:  97 kg    Height:        Intake/Output Summary (Last 24 hours) at 03/28/2018 0804 Last data filed at 03/28/2018 0200 Gross per 24 hour  Intake 300 ml  Output -  Net 300 ml   Filed Weights   03/25/18 2030 03/26/18 2028 03/28/18 0500  Weight: 94.6 kg 94.7 kg 97 kg    Exam: General exam: In no acute distress. Respiratory system: Good air movement and clear to auscultation. Cardiovascular system: S1 & S2 heard, RRR. No JVD. Gastrointestinal system: Abdomen is nondistended, soft and nontender.  Central nervous system: Alert and oriented. No focal neurological deficits. Psychiatry: Judgement and insight appear normal. Mood & affect appropriate.    Data Reviewed:    Labs: Basic Metabolic  Panel: Recent Labs  Lab 03/22/18 0905 03/23/18 0438 03/25/18 0805 03/28/18 0347  NA 136 135 133* 133*  K 4.3 5.3* 4.1 4.6  CL 93* 93* 93* 91*  CO2 29 26 23 25   GLUCOSE 126* 128* 149* 157*  BUN 47* 62* 63* 75*  CREATININE 8.03* 10.37* 9.08* 10.92*  CALCIUM 8.4* 8.5* 8.2* 8.4*  PHOS  --   --  5.8*  --    GFR Estimated Creatinine Clearance: 7.9 mL/min (A) (by C-G formula based on SCr of 10.92 mg/dL (H)). Liver Function Tests: Recent Labs  Lab 03/25/18 0805  ALBUMIN 2.1*   No results for input(s): LIPASE, AMYLASE in the last 168 hours. No results for input(s): AMMONIA in the last 168 hours. Coagulation  profile Recent Labs  Lab 03/24/18 0452 03/25/18 0427 03/26/18 0549 03/27/18 0520 03/28/18 0347  INR 4.16* 4.31* 1.63 1.48 1.69    CBC: Recent Labs  Lab 03/22/18 0905 03/23/18 0438 03/25/18 0805 03/28/18 0347  WBC 6.6 7.0 6.6 7.7  HGB 11.3* 11.3* 10.0* 9.9*  HCT 36.9* 36.6* 32.2* 31.0*  MCV 91.1 90.1 89.0 87.3  PLT 220 201 188 197   Cardiac Enzymes: No results for input(s): CKTOTAL, CKMB, CKMBINDEX, TROPONINI in the last 168 hours. BNP (last 3 results) No results for input(s): PROBNP in the last 8760 hours. CBG: Recent Labs  Lab 03/27/18 0712 03/27/18 1421 03/27/18 1829 03/27/18 2107 03/28/18 0634  GLUCAP 135* 150* 202* 109* 136*   D-Dimer: No results for input(s): DDIMER in the last 72 hours. Hgb A1c: No results for input(s): HGBA1C in the last 72 hours. Lipid Profile: No results for input(s): CHOL, HDL, LDLCALC, TRIG, CHOLHDL, LDLDIRECT in the last 72 hours. Thyroid function studies: No results for input(s): TSH, T4TOTAL, T3FREE, THYROIDAB in the last 72 hours.  Invalid input(s): FREET3 Anemia work up: No results for input(s): VITAMINB12, FOLATE, FERRITIN, TIBC, IRON, RETICCTPCT in the last 72 hours. Sepsis Labs: Recent Labs  Lab 03/22/18 0905 03/23/18 0438 03/25/18 0805 03/28/18 0347  WBC 6.6 7.0 6.6 7.7   Microbiology Recent Results (from the past 240 hour(s))  MRSA PCR Screening     Status: None   Collection Time: 03/22/18  6:13 AM  Result Value Ref Range Status   MRSA by PCR NEGATIVE NEGATIVE Final    Comment:        The GeneXpert MRSA Assay (FDA approved for NASAL specimens only), is one component of a comprehensive MRSA colonization surveillance program. It is not intended to diagnose MRSA infection nor to guide or monitor treatment for MRSA infections. Performed at [facility], [address], [place], [place] [address]   Surgical pcr screen     Status: None   Collection Time: 03/27/18  5:23 AM  Result Value Ref Range Status    MRSA, PCR NEGATIVE NEGATIVE Final   Staphylococcus aureus NEGATIVE NEGATIVE Final    Comment: (NOTE) The Xpert SA Assay (FDA approved for NASAL specimens in patients 1 years of age and older), is one component of a comprehensive surveillance program. It is not intended to diagnose infection nor to guide or monitor treatment. Performed at [facility], [place] [address], [place], [place] [address]      Medications:   . allopurinol  100 mg Oral Daily  . aspirin EC  81 mg Oral Daily  . carvedilol  12.5 mg Oral BID WC  . Chlorhexidine Gluconate Cloth  6 each Topical Q0600  . cinacalcet  30 mg Oral Q supper  .  darbepoetin (ARANESP) injection - DIALYSIS  60 mcg Intravenous Q Sat-HD  . diltiazem  180 mg Oral Daily  . docusate sodium  100 mg Oral Daily  . doxercalciferol  3.5 mcg Intravenous Q T,Th,Sa-HD  . insulin aspart  0-9 Units Subcutaneous TID WC  . pantoprazole  40 mg Oral Daily  . pravastatin  5 mg Oral QPM  . sevelamer carbonate  1,600 mg Oral With snacks  . sevelamer carbonate  3,200 mg Oral TID WC  . sodium chloride flush  3 mL Intravenous Q12H   Continuous Infusions: . sodium chloride 10 mL/hr at 03/27/18 2043  . sodium chloride    .  ceFAZolin (ANCEF) IV 1 g (03/27/18 2150)      LOS: 8 days   Charlynne Cousins  [facility]  03/28/2018, 8:04 AM

## 2018-03-28 NOTE — Procedures (Signed)
Patient seen  And examined Hemodialysis. BP 114/72   Pulse (!) 109   Temp 98.5 F (36.9 C) (Oral)   Resp 17   Ht 6\' 2"  (1.88 m)   Wt 97.3 kg   SpO2 99%   BMI 27.54 kg/m   QB 400 via AVF, UF goal 2L.  Tolerating treatment without complaints at this time.   Madelon Lips MD 4:41 PM

## 2018-03-28 NOTE — Progress Notes (Addendum)
  Progress Note    03/28/2018 7:40 AM 1 Day Post-Op  Subjective:  No complaints  Afebrile   Vitals:   03/28/18 0531 03/28/18 0552  BP: 104/62   Pulse: 86   Resp: (!) 23   Temp: 97.8 F (36.6 C)   SpO2:  93%    Physical Exam: General:  Sitting up in bed eating breakfast Lungs:  Non labored Incisions:  Right groin soft without hematoma Extremities:  +doppler signals left AT/peroneal and right AT   CBC    Component Value Date/Time   WBC 7.7 03/28/2018 0347   RBC 3.55 (L) 03/28/2018 0347   HGB 9.9 (L) 03/28/2018 0347   HCT 31.0 (L) 03/28/2018 0347   PLT 197 03/28/2018 0347   MCV 87.3 03/28/2018 0347   MCH 27.9 03/28/2018 0347   MCHC 31.9 03/28/2018 0347   RDW 14.0 03/28/2018 0347   LYMPHSABS 0.7 03/20/2018 1931   MONOABS 0.7 03/20/2018 1931   EOSABS 0.1 03/20/2018 1931   BASOSABS 0.0 03/20/2018 1931    BMET    Component Value Date/Time   NA 133 (L) 03/28/2018 0347   K 4.6 03/28/2018 0347   CL 91 (L) 03/28/2018 0347   CO2 25 03/28/2018 0347   GLUCOSE 157 (H) 03/28/2018 0347   BUN 75 (H) 03/28/2018 0347   CREATININE 10.92 (H) 03/28/2018 0347   CALCIUM 8.4 (L) 03/28/2018 0347   CALCIUM 9.0 03/21/2007 0415   GFRNONAA 4 (L) 03/28/2018 0347   GFRAA 5 (L) 03/28/2018 0347    INR    Component Value Date/Time   INR 1.69 03/28/2018 0347     Intake/Output Summary (Last 24 hours) at 03/28/2018 0740 Last data filed at 03/28/2018 0200 Gross per 24 hour  Intake 300 ml  Output -  Net 300 ml     Assessment:  63 y.o. male is s/p:  1.  Ultrasound-guided cannulation right common femoral artery 2.  Aortogram bilateral lower extremity runoff 3.  Balloon angioplasty of left posterior tibial artery with 2.5 and 3 mm balloons 4.  Mynx device closure right common femoral artery 5.  Moderate sedation with fentanyl Versed for 73 minutes  1 Day Post-Op  Plan: -pt with doppler signals bilaterally -pt for OR tomorrow; pt made npo, but will defer consent to Dr. Donzetta Matters for  exact procedure.  Pt is pre-op'd otherwise.  -INR is 1.69 -float left heel   Leontine Locket, PA-C [facility] [phone] 03/28/2018 7:40 AM  I have independently interviewed and examined patient and agree with PA assessment and plan above. Plan for bilateral second toes for gangrene. Remains high risk for transtibial amputation.  Thayer Inabinet C. Donzetta Matters, MD [facility] [place] Office: [phone] Pager: [phone]

## 2018-03-28 NOTE — Progress Notes (Signed)
Pharmacy Antibiotic Note  Albert [name] is a 63 y.o. male on day # 9 Cefazolin for wound infections/cellulitis with gangrenous changes. S/p aortogram on 2/3 and planning amputation of toes on both feet on 03/29/18 pm.    ESRD, on TTS hemodialysis. Afebrile, WBC 7.7.     Coumadin for atrial fibrillation has been on hold since admitted.  INR was supratherapeutic on admit and remained supratherapeutic despite holding Coumadin for 6 days. Vitamin K 1mg  PO was given on 03/25/18.   INR 1.69 today.   Plan:  Continue Cefazolin 1gm IV q24hrs - daily dose scheduled at 10pm.  Follow up post-op antibiotic and anticoagulation plans.  Height: 6\' 2"  (188 cm) Weight: 213 lb 13.5 oz (97 kg) IBW/kg (Calculated) : 82.2  Temp (24hrs), Avg:97.9 F (36.6 C), Min:97.1 F (36.2 C), Max:98.7 F (37.1 C)  Recent Labs  Lab 03/22/18 0905 03/23/18 0438 03/25/18 0805 03/28/18 0347  WBC 6.6 7.0 6.6 7.7  CREATININE 8.03* 10.37* 9.08* 10.92*     Allergies  Allergen Reactions  . Metformin And Related Other (See Comments)    Diarrhea, bloody stool, upset stomach.     Antimicrobials this admission:  Cefazolin 1/27>>  Dose adjustments this admission:  n/a  Microbiology results:  1/29 MRSA PCR - negative  2/3 MRSA PCR - negative  Thank you for allowing pharmacy to be a part of this patient's care.  Arty Baumgartner, [place] Pager: [phone]  03/28/2018 11:29 AM

## 2018-03-29 ENCOUNTER — Inpatient Hospital Stay (HOSPITAL_COMMUNITY): Payer: Medicare HMO | Admitting: Certified Registered Nurse Anesthetist

## 2018-03-29 ENCOUNTER — Encounter (HOSPITAL_COMMUNITY)
Admission: EM | Disposition: A | Discharge status: Skilled Nursing Facility | Payer: Self-pay | Source: Home / Self Care | Attending: Internal Medicine

## 2018-03-29 ENCOUNTER — Encounter (HOSPITAL_COMMUNITY): Payer: Self-pay | Admitting: Certified Registered Nurse Anesthetist

## 2018-03-29 DIAGNOSIS — I739 Peripheral vascular disease, unspecified: Secondary | ICD-10-CM

## 2018-03-29 DIAGNOSIS — Z89432 Acquired absence of left foot: Secondary | ICD-10-CM

## 2018-03-29 DIAGNOSIS — L03116 Cellulitis of left lower limb: Secondary | ICD-10-CM

## 2018-03-29 DIAGNOSIS — I96 Gangrene, not elsewhere classified: Secondary | ICD-10-CM

## 2018-03-29 DIAGNOSIS — Z89431 Acquired absence of right foot: Secondary | ICD-10-CM

## 2018-03-29 HISTORY — PX: AMPUTATION: SHX166

## 2018-03-29 LAB — PROTIME-INR
INR: 1.81
Prothrombin Time: 20.7 seconds — ABNORMAL HIGH (ref 11.4–15.2)

## 2018-03-29 LAB — BASIC METABOLIC PANEL
Anion gap: 13 (ref 5–15)
BUN: 34 mg/dL — ABNORMAL HIGH (ref 8–23)
CHLORIDE: 96 mmol/L — AB (ref 98–111)
CO2: 29 mmol/L (ref 22–32)
Calcium: 8.6 mg/dL — ABNORMAL LOW (ref 8.9–10.3)
Creatinine, Ser: 6.61 mg/dL — ABNORMAL HIGH (ref 0.61–1.24)
GFR calc Af Amer: 9 mL/min — ABNORMAL LOW (ref >60)
GFR calc non Af Amer: 8 mL/min — ABNORMAL LOW (ref >60)
Glucose, Bld: 153 mg/dL — ABNORMAL HIGH (ref 70–99)
POTASSIUM: 4 mmol/L (ref 3.5–5.1)
Sodium: 138 mmol/L (ref 135–145)

## 2018-03-29 LAB — CBC
HCT: 31.8 % — ABNORMAL LOW (ref 39.0–52.0)
Hemoglobin: 10.3 g/dL — ABNORMAL LOW (ref 13.0–17.0)
MCH: 28.6 pg (ref 26.0–34.0)
MCHC: 32.4 g/dL (ref 30.0–36.0)
MCV: 88.3 fL (ref 80.0–100.0)
NRBC: 0 % (ref 0.0–0.2)
Platelets: 219 10*3/uL (ref 150–400)
RBC: 3.6 MIL/uL — ABNORMAL LOW (ref 4.22–5.81)
RDW: 14.4 % (ref 11.5–15.5)
WBC: 7.9 10*3/uL (ref 4.0–10.5)

## 2018-03-29 LAB — GLUCOSE, CAPILLARY
GLUCOSE-CAPILLARY: 140 mg/dL — AB (ref 70–99)
GLUCOSE-CAPILLARY: 111 mg/dL — AB (ref 70–99)
Glucose-Capillary: 138 mg/dL — ABNORMAL HIGH (ref 70–99)
Glucose-Capillary: 135 mg/dL — ABNORMAL HIGH (ref 70–99)
Glucose-Capillary: 133 mg/dL — ABNORMAL HIGH (ref 70–99)
Glucose-Capillary: 130 mg/dL — ABNORMAL HIGH (ref 70–99)

## 2018-03-29 SURGERY — AMPUTATION DIGIT
Anesthesia: General | Site: Toe | Laterality: Bilateral

## 2018-03-29 MED ORDER — FENTANYL CITRATE (PF) 100 MCG/2ML IJ SOLN
INTRAMUSCULAR | Status: DC | PRN
  Administered 2018-03-29: 50 ug via INTRAVENOUS
  Administered 2018-03-29 (×4): 25 ug via INTRAVENOUS

## 2018-03-29 MED ORDER — PROPOFOL 10 MG/ML IV BOLUS
INTRAVENOUS | Status: AC
  Filled 2018-03-29: qty 20

## 2018-03-29 MED ORDER — ONDANSETRON HCL 4 MG/2ML IJ SOLN
INTRAMUSCULAR | Status: AC
  Filled 2018-03-29: qty 2

## 2018-03-29 MED ORDER — CEFAZOLIN SODIUM 1 G IJ SOLR
INTRAMUSCULAR | Status: AC
  Filled 2018-03-29: qty 20

## 2018-03-29 MED ORDER — CARVEDILOL 6.25 MG PO TABS
6.2500 mg | ORAL_TABLET | Freq: Two times a day (BID) | ORAL | Status: DC
  Administered 2018-03-29 (×2): 6.25 mg via ORAL
  Filled 2018-03-29 (×2): qty 1

## 2018-03-29 MED ORDER — FENTANYL CITRATE (PF) 250 MCG/5ML IJ SOLN
INTRAMUSCULAR | Status: AC
  Filled 2018-03-29: qty 5

## 2018-03-29 MED ORDER — FENTANYL CITRATE (PF) 100 MCG/2ML IJ SOLN: 25.0000 ug | INTRAMUSCULAR | Status: DC | PRN

## 2018-03-29 MED ORDER — PHENYLEPHRINE 40 MCG/ML (10ML) SYRINGE FOR IV PUSH (FOR BLOOD PRESSURE SUPPORT)
PREFILLED_SYRINGE | INTRAVENOUS | Status: DC | PRN
  Administered 2018-03-29 (×4): 80 ug via INTRAVENOUS

## 2018-03-29 MED ORDER — LIDOCAINE 2% (20 MG/ML) 5 ML SYRINGE
INTRAMUSCULAR | Status: DC | PRN
  Administered 2018-03-29: 80 mg via INTRAVENOUS

## 2018-03-29 MED ORDER — HEPARIN SODIUM (PORCINE) 5000 UNIT/ML IJ SOLN
5000.0000 [IU] | Freq: Three times a day (TID) | INTRAMUSCULAR | Status: DC
  Administered 2018-03-29 – 2018-03-31 (×5): 5000 [IU] via SUBCUTANEOUS
  Filled 2018-03-29 (×5): qty 1

## 2018-03-29 MED ORDER — SODIUM CHLORIDE (PF) 0.9 % IJ SOLN
INTRAMUSCULAR | Status: AC
  Filled 2018-03-29: qty 10

## 2018-03-29 MED ORDER — LIDOCAINE 2% (20 MG/ML) 5 ML SYRINGE
INTRAMUSCULAR | Status: AC
  Filled 2018-03-29: qty 5

## 2018-03-29 MED ORDER — FENTANYL CITRATE (PF) 100 MCG/2ML IJ SOLN
12.5000 ug | INTRAMUSCULAR | Status: AC | PRN
  Administered 2018-03-29 – 2018-03-30 (×3): 12.5 ug via INTRAVENOUS
  Filled 2018-03-29 (×3): qty 2

## 2018-03-29 MED ORDER — PHENOL 1.4 % MT LIQD: 1.0000 | OROMUCOSAL | Status: DC | PRN

## 2018-03-29 MED ORDER — PHENYLEPHRINE 40 MCG/ML (10ML) SYRINGE FOR IV PUSH (FOR BLOOD PRESSURE SUPPORT)
PREFILLED_SYRINGE | INTRAVENOUS | Status: AC
  Filled 2018-03-29: qty 10

## 2018-03-29 MED ORDER — MIDAZOLAM HCL 2 MG/2ML IJ SOLN
INTRAMUSCULAR | Status: AC
  Filled 2018-03-29: qty 2

## 2018-03-29 MED ORDER — 0.9 % SODIUM CHLORIDE (POUR BTL) OPTIME
TOPICAL | Status: DC | PRN
  Administered 2018-03-29: 1000 mL

## 2018-03-29 MED ORDER — ONDANSETRON HCL 4 MG/2ML IJ SOLN
INTRAMUSCULAR | Status: DC | PRN
  Administered 2018-03-29: 4 mg via INTRAVENOUS

## 2018-03-29 MED ORDER — PROPOFOL 10 MG/ML IV BOLUS
INTRAVENOUS | Status: DC | PRN
  Administered 2018-03-29: 100 mg via INTRAVENOUS

## 2018-03-29 SURGICAL SUPPLY — 38 items
BANDAGE ACE 4X5 VEL STRL LF (GAUZE/BANDAGES/DRESSINGS) ×1 IMPLANT
BNDG GAUZE ELAST 4 BULKY (GAUZE/BANDAGES/DRESSINGS) ×3 IMPLANT
CANISTER SUCT 3000ML PPV (MISCELLANEOUS) ×2 IMPLANT
COVER SURGICAL LIGHT HANDLE (MISCELLANEOUS) ×1 IMPLANT
COVER WAND RF STERILE (DRAPES) ×1 IMPLANT
DRAPE EXTREMITY T 121X128X90 (DISPOSABLE) ×3 IMPLANT
DRAPE HALF SHEET 40X57 (DRAPES) ×1 IMPLANT
DRAPE SHEET LG 3/4 BI-LAMINATE (DRAPES) ×1 IMPLANT
DRSG EMULSION OIL 3X3 NADH (GAUZE/BANDAGES/DRESSINGS) ×1 IMPLANT
DRSG XEROFORM 1X8 (GAUZE/BANDAGES/DRESSINGS) ×1 IMPLANT
ELECT CAUTERY BLADE 6.4 (BLADE) ×1 IMPLANT
ELECT REM PT RETURN 9FT ADLT (ELECTROSURGICAL) ×2
ELECTRODE REM PT RTRN 9FT ADLT (ELECTROSURGICAL) ×1 IMPLANT
GAUZE SPONGE 4X4 12PLY STRL (GAUZE/BANDAGES/DRESSINGS) ×3 IMPLANT
GAUZE XEROFORM 5X9 LF (GAUZE/BANDAGES/DRESSINGS) ×1 IMPLANT
GLOVE BIO SURGEON STRL SZ7.5 (GLOVE) ×2 IMPLANT
GLOVE BIOGEL PI IND STRL 7.0 (GLOVE) IMPLANT
GLOVE BIOGEL PI IND STRL 7.5 (GLOVE) IMPLANT
GLOVE BIOGEL PI IND STRL 8 (GLOVE) IMPLANT
GLOVE BIOGEL PI INDICATOR 7.0 (GLOVE) ×1
GLOVE BIOGEL PI INDICATOR 7.5 (GLOVE) ×1
GLOVE BIOGEL PI INDICATOR 8 (GLOVE) ×3
GOWN STRL REUS W/ TWL LRG LVL3 (GOWN DISPOSABLE) ×3 IMPLANT
GOWN STRL REUS W/TWL 2XL LVL3 (GOWN DISPOSABLE) ×1 IMPLANT
GOWN STRL REUS W/TWL LRG LVL3 (GOWN DISPOSABLE) ×2
KIT BASIN OR (CUSTOM PROCEDURE TRAY) ×2 IMPLANT
KIT TURNOVER KIT B (KITS) ×2 IMPLANT
NS IRRIG 1000ML POUR BTL (IV SOLUTION) ×2 IMPLANT
PACK GENERAL/GYN (CUSTOM PROCEDURE TRAY) ×2 IMPLANT
PAD ARMBOARD 7.5X6 YLW CONV (MISCELLANEOUS) ×5 IMPLANT
SPECIMEN JAR SMALL (MISCELLANEOUS) ×1 IMPLANT
SUT ETHILON 3 0 PS 1 (SUTURE) ×3 IMPLANT
SUT VIC AB 3-0 SH 27 (SUTURE) ×2
SUT VIC AB 3-0 SH 27X BRD (SUTURE) IMPLANT
TOWEL GREEN STERILE (TOWEL DISPOSABLE) ×4 IMPLANT
TOWEL GREEN STERILE FF (TOWEL DISPOSABLE) ×2 IMPLANT
UNDERPAD 30X30 (UNDERPADS AND DIAPERS) ×2 IMPLANT
WATER STERILE IRR 1000ML POUR (IV SOLUTION) ×2 IMPLANT

## 2018-03-29 NOTE — Transfer of Care (Signed)
Immediate Anesthesia Transfer of Care Note  Patient: Albert [name]  Procedure(s) Performed: RESECTION TRANSMETATARSAL - LEFT SECOND TOE OF LEFT FOOT TOE RESECTION TRANSMETATARSAL FIRST ,SECOND,THIRD TOE RIGHT FOOT REMOVAL SECOND TOE GREAT TOE ,THIRD TOE (Bilateral Toe)  Patient Location: PACU  Anesthesia Type:General  Level of Consciousness: awake, alert  and oriented  Airway & Oxygen Therapy: Patient Spontanous Breathing and Patient connected to nasal cannula oxygen  Post-op Assessment: Report given to RN and Post -op Vital signs reviewed and stable  Post vital signs: Reviewed and stable  Last Vitals:  Vitals Value Taken Time  BP 113/76   Temp    Pulse 67   Resp 16   SpO2 99     Last Pain:  Vitals:   03/29/18 1006  TempSrc:   PainSc: 7       Patients Stated Pain Goal: 4 (18/36/72 5500)  Complications: No apparent anesthesia complications

## 2018-03-29 NOTE — Interval H&P Note (Signed)
History and Physical Interval Note:  03/29/2018 10:17 AM  Albert [name]  has presented today for surgery, with the diagnosis of ischemic toes  The various methods of treatment have been discussed with the patient and family. After consideration of risks, benefits and other options for treatment, the patient has consented to  Procedure(s): AMPUTATION DIGIT BILATERAL TOES (Bilateral) as a surgical intervention .  The patient's history has been reviewed, patient examined, no change in status, stable for surgery.  I have reviewed the patient's chart and labs.  Questions were answered to the patient's satisfaction.     Ruta Hinds

## 2018-03-29 NOTE — Op Note (Signed)
Procedure: #1 left transmetatarsal amputation second toe  2.  Right transmetatarsal amputation of toes 1 2 and 3  Preoperative diagnosis: Gangrene toes bilaterally  Postoperative diagnosis: Same  Anesthesia: General  Assistant: Nurse  Operative findings: #1 poorly bleeding tissue left second toe  2.  Large amount of necrotic debris and gangrenous tissue toes 1 2 and 3 right foot requiring transmetatarsal amputation of these rather than isolated second toe amputation  Operative details: After obtaining informed consent, the patient was taken the operating.  The patient was placed in supine position operating table.  After induction of general anesthesia and placement of a laryngeal mask patient's feet were prepped and draped bilaterally.  Attention was turned to the left foot.  A circumferential incision was made on the left second toe between the middle and proximal phalanx.  Incision was carried down to the level of the bone and the bone transected through the joint space.  There was no skin edge bleeding.  I debrided the proximal phalanx back all the way to the level of the metatarsal head and also resected the metatarsal head of that toe.  There was still poor bleeding.  There was no purulent material.  There was no apparent gangrenous tissue.  At this point the wound was thoroughly irrigated with normal saline solution and the skin edges reapproximated with alternating 3 oh vertical mattress and simple nylon sutures.    Attention was then turned to the right foot.  A racquet shaped incision was made around the second toe incorporating the base of the toe and up onto the metatarsal midsection on the right foot.  There was some skin edge bleeding on this side.  I removed the proximal half of the metatarsal and toe from this foot.  However after I had removed the toe and this portion of bone there was still gangrenous tissue lateral and medial to this.  On debriding this back to healthy  appearing tissue basically the right first and third toes had been completely exposed at this point and were not going to be viable.  Therefore I did a transmetatarsal amputation of toes 1 and 3 as well.  There was some pulsatile bleeding from a calcified digital vessel which was controlled with cautery.  There was minimal skin edge bleeding from the entire wound.  The wound was thoroughly irrigated with normal saline solution.  The skin edges were then reapproximated using alternating 3 oh simple and vertical mattress nylon sutures.  Adaptic and dry sterile dressings were applied to both feet.  The patient tolerated procedure well and there were no complications.  The instrument sponge and needle count was correct the end of the case.  The patient was taken the recovery room in stable condition.  Ruta Hinds, MD [facility] Office: [phone] Pager: [phone]

## 2018-03-29 NOTE — Progress Notes (Signed)
Small amount of bright red drainage noted on lateral aspect of R foot drsg. Drsg re-enforced and foot elevated. Will continue to monitor.  Clyde Canterbury, RN

## 2018-03-29 NOTE — Progress Notes (Signed)
Pt received from PACU. VSS. Bilateral foot dressings clean dry and intact. Telemetry applied. CHG complete. Will continue to monitor.  Clyde Canterbury, RN

## 2018-03-29 NOTE — Plan of Care (Signed)

## 2018-03-29 NOTE — Consult Note (Signed)
Physical Medicine and Rehabilitation Consult   Reason for Consult: Functional deficits from amputation of Right 1st-3rd toes and Left 2 nd toesdue to gangrenous changes multiple toes.  Referring Physician: Dr. Oneida Alar   HPI: Albert [name] is a 63 y.o. male with history of CAF-on coumadin, T2DM with polyneuropathy and ulcers left foot, ESRD- HD TTS; who was admitted on 03/29/18 with worsening of foot pain with edema 5-6 days PTA. He was found to have cellulitis left foot with gangrenous changes bilateral 2 nd toes and was started on IV antibiotics for treatment.  ABIs revealed significant arterial occlusive disease and he was transferred to [facility] for intervention. Angiogram revealed heavily calcified aorta and iliac segments and Dr. Donzetta Matters recommended amputation of bilateral 2nd toes abut patient at high risk for B-proximal LE amputations.  Coumadin reversed and patient underwent right transmetatarsal amputation of 1st, 2nd, 3rd toes and left 2nd toe due to on 2/5 by Dr. Oneida Alar.    Review of Systems  Constitutional: Negative for fever.  HENT: Negative for ear pain.   Eyes: Negative for redness.  Respiratory: Negative for hemoptysis.   Cardiovascular: Negative for orthopnea.  Gastrointestinal: Negative for abdominal pain.  Genitourinary: Negative for frequency.  Musculoskeletal: Positive for joint pain. Negative for neck pain.  Skin: Negative for itching.  Neurological: Negative for sensory change.  Psychiatric/Behavioral: Negative for depression.      Past Medical History:  Diagnosis Date  . Anemia   . Atrial fibrillation ([place])   . Cardiomyopathy    LVEF 50-55% 8/11 - [facility]  . Colonic polyp   . Diverticulosis of colon   . ESRD on hemodialysis ([place])    Dr. Lowanda Foster [facility]  . Essential hypertension, benign   . Gout   . Mixed hyperlipidemia   . Neuropathy    in both feet  . OSA (obstructive sleep apnea)    CPAP  . Renal cell cancer ([place])   . Type 2 diabetes mellitus  ([employer])    Type II    Past Surgical History:  Procedure Laterality Date  . ABDOMINAL AORTOGRAM W/LOWER EXTREMITY N/A 03/27/2018   Procedure: ABDOMINAL AORTOGRAM W/LOWER EXTREMITY;  Surgeon: Waynetta Sandy, MD;  Location: [place] [facility];  Service: Cardiovascular;  Laterality: N/A;  . BIOPSY  01/05/2017   Procedure: BIOPSY;  Surgeon: Daneil Dolin, MD;  [facility];  Service: Endoscopy;;  gastric  . COLONOSCOPY N/A 01/07/2016   Dr. [name]: Grade 1 internal hemorrhoids, otherwise normal  . COLONOSCOPY W/ POLYPECTOMY  2009, 2012   2009: Dr. [name]: 1.25 cm adenomatous polyp without high grade dysplasia, distal to IC valve. 2012: normal rectum, few pancolonic diverticula, single diminutive hyperplastic polyp  . ESOPHAGOGASTRODUODENOSCOPY  2009   Dr. [name]: accentuated undulating Z-lin with couple of islands of salmon-colored epithelium suspicious for Barrett's, s/p biopsy with path revealing mild inflammation, no metaplasia, dysplasia, or malignancy  . ESOPHAGOGASTRODUODENOSCOPY N/A 01/05/2017   Procedure: ESOPHAGOGASTRODUODENOSCOPY (EGD);  Surgeon: Daneil Dolin, MD;  [facility];  Service: Endoscopy;  Laterality: N/A;  . EUS N/A 04/21/2017   Procedure: UPPER ENDOSCOPIC ULTRASOUND (EUS) LINEAR;  Surgeon: Milus Banister, MD;  Location: [facility];  Service: Endoscopy;  Laterality: N/A;  . FISTULA SUPERFICIALIZATION Left 03/31/2015   Procedure: CEPHALIC VEIN TURNDOWN; PLICATION OF BRACHIOCEPHALIC AVF;  Surgeon: Conrad [place], MD;  Location: [facility];  Service: Vascular;  Laterality: Left;  . FISTULOGRAM N/A 10/19/2013   Procedure: FISTULOGRAM;  Surgeon: Arvilla Meres  Early, MD;  Location: [facility];  Service: Cardiovascular;  Laterality: N/A;  . Left arm AV fistula  2009   Dr. [name]  . PERIPHERAL VASCULAR BALLOON ANGIOPLASTY  03/27/2018   Procedure: PERIPHERAL VASCULAR BALLOON ANGIOPLASTY;  Surgeon: Waynetta Sandy, MD;  Location: [facility];   Service: Cardiovascular;;  LT PT  . PERIPHERAL VASCULAR CATHETERIZATION N/A 03/10/2015   Procedure: Fistulagram;  Surgeon: Conrad , MD;  Location: [place] [facility];  Service: Cardiovascular;  Laterality: N/A;  . Right nephrectomy      Family History  Problem Relation Age of Onset  . Hypertension Mother   . Hypertension Other        Siblings  . Diabetes type II Other        Siblings  . Colon cancer Neg Hx      Social History:  reports that he quit smoking about 19 years ago. His smoking use included cigarettes. He has never used smokeless tobacco. He reports that he does not drink alcohol or use drugs.   Allergies  Allergen Reactions  . Metformin And Related Other (See Comments)    Diarrhea, bloody stool, upset stomach.     Medications Prior to Admission  Medication Sig Dispense Refill  . allopurinol (ZYLOPRIM) 100 MG tablet Take 100 mg by mouth daily.     . carvedilol (COREG) 25 MG tablet TAKE 1 TABLET TWICE DAILY WITH MEALS (Patient taking differently: Take 12.5 mg by mouth 2 (two) times daily with a meal. ) 180 tablet 3  . cinacalcet (SENSIPAR) 30 MG tablet Take 30 mg by mouth every evening.     . diltiazem (CARDIZEM CD) 180 MG 24 hr capsule TAKE 1 CAPSULE EVERY DAY (Patient taking differently: Take 180 mg by mouth every morning. ) 90 capsule 3  . docusate sodium (COLACE) 100 MG capsule Take 100 mg by mouth daily.    . fenofibrate 160 MG tablet Take 160 mg by mouth at bedtime.     . fish oil-omega-3 fatty acids 1000 MG capsule Take 1 g by mouth 3 (three) times daily.     Marland Kitchen gabapentin (NEURONTIN) 300 MG capsule Take 300 mg by mouth 3 (three) times daily.     Marland Kitchen glipiZIDE (GLUCOTROL XL) 5 MG 24 hr tablet Take 5 mg by mouth every morning.     . pantoprazole (PROTONIX) 40 MG tablet Take 1 tablet (40 mg total) by mouth daily. (Patient taking differently: Take 40 mg by mouth every evening. ) 30 tablet 1  . pravastatin (PRAVACHOL) 10 MG tablet Take 5 mg by mouth at bedtime.       . sevelamer carbonate (RENVELA) 800 MG tablet Take 1,600-3,200 mg by mouth See admin instructions. Take 3200 mg after each meal and 1600 mg each snack    . TRADJENTA 5 MG TABS tablet Take 5 mg by mouth every morning.     . warfarin (COUMADIN) 2.5 MG tablet TAKE 1 TABLET EVERY DAY EXCEPT TAKE  1/2 TABLET ON SUNDAYS, TUESDAYS AND THURSDAYS OR AS DIRECTED (Patient taking differently: Take 2.5 mg by mouth every evening. ) 135 tablet 3  . Wheat Dextrin (BENEFIBER DRINK MIX PO) Take 1 Dose by mouth daily. 1 tbsp     . blood glucose meter kit and supplies Dispense based on patient and insurance preference. Use up to four times daily as directed. (FOR ICD-9 250.00, 250.01). 1 each 0  . Insulin Pen Needle 31G X 5 MM MISC Use as directed 30  each 0    Home: Home Living Family/patient expects to be discharged to:: Private residence Living Arrangements: Alone  Functional History:   Functional Status:  Mobility:          ADL:    Cognition: Cognition Orientation Level: Oriented X4    Blood pressure 112/74, pulse 91, temperature 97.8 F (36.6 C), temperature source Oral, resp. rate 14, height 6' 2" (1.88 m), weight 95.6 kg, SpO2 96 %. Physical Exam  Constitutional: He is oriented to person, place, and time. No distress.  HENT:  Head: Normocephalic.  Eyes: Pupils are equal, round, and reactive to light.  Neck: Normal range of motion.  Cardiovascular: Normal rate.  Respiratory: Effort normal.  GI: Soft.  Musculoskeletal:     Comments: Both feet tender with movement  Neurological: He is alert and oriented to person, place, and time.  UE 4/5. LE 3/5 prox with HF, KE and ADF/PF present but not tested due to pain/dressing  Skin: He is not diaphoretic.  Both feet dressed without visible drainage  Psychiatric: He has a normal mood and affect. His behavior is normal.    Results for orders placed or performed during the hospital encounter of 03/20/18 (from the past 24 hour(s))  Glucose,  capillary     Status: Abnormal   Collection Time: 03/28/18  5:29 PM  Result Value Ref Range   Glucose-Capillary 137 (H) 70 - 99 mg/dL  Glucose, capillary     Status: Abnormal   Collection Time: 03/28/18  9:28 PM  Result Value Ref Range   Glucose-Capillary 150 (H) 70 - 99 mg/dL  Protime-INR     Status: Abnormal   Collection Time: 03/29/18  3:50 AM  Result Value Ref Range   Prothrombin Time 20.7 (H) 11.4 - 15.2 seconds   INR 2.99   Basic metabolic panel     Status: Abnormal   Collection Time: 03/29/18  3:50 AM  Result Value Ref Range   Sodium 138 135 - 145 mmol/L   Potassium 4.0 3.5 - 5.1 mmol/L   Chloride 96 (L) 98 - 111 mmol/L   CO2 29 22 - 32 mmol/L   Glucose, Bld 153 (H) 70 - 99 mg/dL   BUN 34 (H) 8 - 23 mg/dL   Creatinine, Ser 6.61 (H) 0.61 - 1.24 mg/dL   Calcium 8.6 (L) 8.9 - 10.3 mg/dL   GFR calc non Af Amer 8 (L) >60 mL/min   GFR calc Af Amer 9 (L) >60 mL/min   Anion gap 13 5 - 15  CBC     Status: Abnormal   Collection Time: 03/29/18  3:50 AM  Result Value Ref Range   WBC 7.9 4.0 - 10.5 K/uL   RBC 3.60 (L) 4.22 - 5.81 MIL/uL   Hemoglobin 10.3 (L) 13.0 - 17.0 g/dL   HCT 31.8 (L) 39.0 - 52.0 %   MCV 88.3 80.0 - 100.0 fL   MCH 28.6 26.0 - 34.0 pg   MCHC 32.4 30.0 - 36.0 g/dL   RDW 14.4 11.5 - 15.5 %   Platelets 219 150 - 400 K/uL   nRBC 0.0 0.0 - 0.2 %  Glucose, capillary     Status: Abnormal   Collection Time: 03/29/18  5:56 AM  Result Value Ref Range   Glucose-Capillary 138 (H) 70 - 99 mg/dL  Glucose, capillary     Status: Abnormal   Collection Time: 03/29/18  9:27 AM  Result Value Ref Range   Glucose-Capillary 135 (H) 70 - 99 mg/dL  Glucose, capillary     Status: Abnormal   Collection Time: 03/29/18 12:19 PM  Result Value Ref Range   Glucose-Capillary 140 (H) 70 - 99 mg/dL  Glucose, capillary     Status: Abnormal   Collection Time: 03/29/18  1:18 PM  Result Value Ref Range   Glucose-Capillary 111 (H) 70 - 99 mg/dL  Glucose, capillary     Status: Abnormal     Collection Time: 03/29/18  4:18 PM  Result Value Ref Range   Glucose-Capillary 133 (H) 70 - 99 mg/dL   No results found.   Assessment/Plan: Diagnosis: 63 yo male with gangrenous changes in toes/PAD s/p right 1-3 TMA and left 2nd TMA.  1. Does the need for close, 24 hr/day medical supervision in concert with the patient's rehab needs make it unreasonable for this patient to be served in a less intensive setting? Yes 2. Co-Morbidities requiring supervision/potential complications: ESRD on HD, pain mgt, wound care, diabetes mgt 3. Due to bladder management, bowel management, safety, skin/wound care, disease management, medication administration, pain management and patient education, does the patient require 24 hr/day rehab nursing? Yes 4. Does the patient require coordinated care of a physician, rehab nurse, PT (1-2 hrs/day, 5 days/week) and OT (1-2 hrs/day, 5 days/week) to address physical and functional deficits in the context of the above medical diagnosis(es)? Yes Addressing deficits in the following areas: balance, endurance, locomotion, strength, transferring, bowel/bladder control, bathing, dressing, feeding, grooming, toileting and psychosocial support 5. Can the patient actively participate in an intensive therapy program of at least 3 hrs of therapy per day at least 5 days per week? Yes 6. The potential for patient to make measurable gains while on inpatient rehab is excellent 7. Anticipated functional outcomes upon discharge from inpatient rehab are independent  with PT, modified independent with OT, n/a with SLP. 8. Estimated rehab length of stay to reach the above functional goals is: 8-12 days  9. Anticipated D/C setting: Home 10. Anticipated post D/C treatments: [place] therapy 11. Overall Rehab/Functional Prognosis: excellent  RECOMMENDATIONS: This patient's condition is appropriate for continued rehabilitative care in the following setting: CIR Patient has agreed to participate  in recommended program. Yes Note that insurance prior authorization may be required for reimbursement for recommended care.  Comment: Patient lives alone in an accessible home. Very motivated. Rehab Admissions Coordinator to follow up.  Thanks,  Meredith Staggers, MD, [facility] Drown  I have personally performed a face to face diagnostic evaluation of this patient. Additionally, I have reviewed and concur with the physician assistant's documentation above.    Bary Leriche, PA-C 03/29/2018

## 2018-03-29 NOTE — Anesthesia Preprocedure Evaluation (Signed)
Anesthesia Evaluation  Patient identified by MRN, date of birth, ID band Patient awake    Reviewed: Allergy & Precautions, NPO status , Patient's Chart, lab work & pertinent test results  Airway Mallampati: I  TM Distance: >3 FB Neck ROM: Full    Dental no notable dental hx. (+) Missing, Dental Advisory Given,    Pulmonary sleep apnea , former smoker,    Pulmonary exam normal breath sounds clear to auscultation       Cardiovascular hypertension, negative cardio ROS Normal cardiovascular exam Rhythm:Regular Rate:Normal  TTE 11/2016 Normal EF, no valvular abnormalities   Neuro/Psych negative neurological ROS  negative psych ROS   GI/Hepatic negative GI ROS, Neg liver ROS,   Endo/Other  negative endocrine ROSdiabetes, Poorly ControlledGlucose 153  Renal/GU Dialysis and ESRFRenal diseaseDialysis T TH Sat. Had dialysis yesterday. Tolerated well.  negative genitourinary   Musculoskeletal negative musculoskeletal ROS (+)   Abdominal   Peds  Hematology negative hematology ROS (+) anemia ,   Anesthesia Other Findings   Reproductive/Obstetrics                            Anesthesia Physical Anesthesia Plan  ASA: III  Anesthesia Plan: General   Post-op Pain Management:    Induction: Intravenous  PONV Risk Score and Plan: 2 and Ondansetron, Dexamethasone and Midazolam  Airway Management Planned: LMA  Additional Equipment:   Intra-op Plan:   Post-operative Plan: Extubation in OR  Informed Consent: I have reviewed the patients History and Physical, chart, labs and discussed the procedure including the risks, benefits and alternatives for the proposed anesthesia with the patient or authorized representative who has indicated his/her understanding and acceptance.     Dental advisory given  Plan Discussed with: CRNA  Anesthesia Plan Comments:         Anesthesia Quick Evaluation

## 2018-03-29 NOTE — Progress Notes (Addendum)
PROGRESS NOTE  Albert [name] LSL:[id number] DOB: Dec 11, 1955 DOA: 03/20/2018 PCP: Redmond School, MD  HPI/Recap of past 24 hours:  Reports  Bilateral foot pain, left > right, no fever, no leukocytosis Denies chest pain, no sob, no edema bp low normal , in chronic afib Npo for surgery this pm  Assessment/Plan: Principal Problem:   Cellulitis of left lower extremity Active Problems:   Essential hypertension, benign   Atrial fibrillation (HCC)   ESRD (end stage renal disease) ([place])   Supratherapeutic INR  Cellulitis of left lower extremity/bilateral dry gangrenous toes/ severe bilateral peripheral vascular disease: - Transfer from [facility] for arteriogram. - Bilateral angiogram done on 03/28/2018 with balloon angioplasty of the left posterior tibial artery - he received IV Ancef since 1/27, he has remained afebrile no leukocytosis. - For bilateral second toe amputation on 2/5 by vascular surgery Dr Oneida Alar, abx duration per vascular surgery. Coumadin resumption when ok with vascular surgery  End renal disease on dialysis: Not making urine anymore HD TTS, plan per nephrology, appreciate nephrology input  Anemia of chronic disease hgb stable at baseline on ESA during HD, plan per nephrology  Chronic atrial fibrillation: Continue Coreg and diltiazem,  Heart rate controlled. [place] vasc score 2. Home meds coumadin held, he presented with supertherapeutic INR (INR 4.5 on 1/28), INR today is 1.8 Needs vascular surgery clearance for resuming anticoagulation bp low normal today, will decrease coreg from 12.5 mg bid to 6.25mg  bid  Diabetes mellitus type II: noninsulin dependent Home meds glipizide and Tradjenta held in the hospital Good control on sliding scale insulin continue current management  Code Status: full  Family Communication: patient   Disposition Plan: not ready to discharge, needs Pt/OT eval, needs vascular surgery and nephrology  clearance   Consultants:  Nephrology  Vascular surgery  Procedures:  HD MWF  Toe amputation by vascular surgery on 2/5  Antibiotics:  ancef   Objective: BP 94/65   Pulse (!) 101   Temp (!) 97.2 F (36.2 C) (Oral)   Resp (!) 21   Ht 6\' 2"  (1.88 m)   Wt 95.6 kg   SpO2 99%   BMI 27.06 kg/m   Intake/Output Summary (Last 24 hours) at 03/29/2018 0721 Last data filed at 03/29/2018 0251 Gross per 24 hour  Intake 403.81 ml  Output 2500 ml  Net -2096.19 ml   Filed Weights   03/28/18 1225 03/28/18 1649 03/29/18 0345  Weight: 97.3 kg 94.6 kg 95.6 kg    Exam: Patient is examined daily including today on 03/29/2018, exams remain the same as of yesterday except that has changed    General:  NAD  Cardiovascular: IRRR  Respiratory: CTABL  Abdomen: Soft/ND/NT, positive BS  Musculoskeletal: No Edema, right second toe dry gangrene, dark wound above left heal  Neuro: alert, oriented   Data Reviewed: Basic Metabolic Panel: Recent Labs  Lab 03/22/18 0905 03/23/18 0438 03/25/18 0805 03/28/18 0347 03/29/18 0350  NA 136 135 133* 133* 138  K 4.3 5.3* 4.1 4.6 4.0  CL 93* 93* 93* 91* 96*  CO2 29 26 23 25 29   GLUCOSE 126* 128* 149* 157* 153*  BUN 47* 62* 63* 75* 34*  CREATININE 8.03* 10.37* 9.08* 10.92* 6.61*  CALCIUM 8.4* 8.5* 8.2* 8.4* 8.6*  PHOS  --   --  5.8*  --   --    Liver Function Tests: Recent Labs  Lab 03/25/18 0805  ALBUMIN 2.1*   No results for input(s): LIPASE, AMYLASE in the last  168 hours. No results for input(s): AMMONIA in the last 168 hours. CBC: Recent Labs  Lab 03/22/18 0905 03/23/18 0438 03/25/18 0805 03/28/18 0347 03/29/18 0350  WBC 6.6 7.0 6.6 7.7 7.9  HGB 11.3* 11.3* 10.0* 9.9* 10.3*  HCT 36.9* 36.6* 32.2* 31.0* 31.8*  MCV 91.1 90.1 89.0 87.3 88.3  PLT 220 201 188 197 219   Cardiac Enzymes:   No results for input(s): CKTOTAL, CKMB, CKMBINDEX, TROPONINI in the last 168 hours. BNP (last 3 results) No results for input(s): BNP  in the last 8760 hours.  ProBNP (last 3 results) No results for input(s): PROBNP in the last 8760 hours.  CBG: Recent Labs  Lab 03/28/18 0634 03/28/18 1114 03/28/18 1729 03/28/18 2128 03/29/18 0556  GLUCAP 136* 129* 137* 150* 138*    Recent Results (from the past 240 hour(s))  MRSA PCR Screening     Status: None   Collection Time: 03/22/18  6:13 AM  Result Value Ref Range Status   MRSA by PCR NEGATIVE NEGATIVE Final    Comment:        The GeneXpert MRSA Assay (FDA approved for NASAL specimens only), is one component of a comprehensive MRSA colonization surveillance program. It is not intended to diagnose MRSA infection nor to guide or monitor treatment for MRSA infections. Performed at [facility], [place], [address], [place], [place] [address]   Surgical pcr screen     Status: None   Collection Time: 03/27/18  5:23 AM  Result Value Ref Range Status   MRSA, PCR NEGATIVE NEGATIVE Final   Staphylococcus aureus NEGATIVE NEGATIVE Final    Comment: (NOTE) The Xpert SA Assay (FDA approved for NASAL specimens in patients 3 years of age and older), is one component of a comprehensive surveillance program. It is not intended to diagnose infection nor to guide or monitor treatment. Performed at [facility], [place] [address] [place], [place] [address]      Studies: No results found.  Scheduled Meds: . allopurinol  100 mg Oral Daily  . aspirin EC  81 mg Oral Daily  . carvedilol  12.5 mg Oral BID WC  . Chlorhexidine Gluconate Cloth  6 each Topical Q0600  . cinacalcet  30 mg Oral Q supper  . darbepoetin (ARANESP) injection - DIALYSIS  60 mcg Intravenous Q Sat-HD  . diltiazem  180 mg Oral Daily  . docusate sodium  100 mg Oral Daily  . doxercalciferol  3.5 mcg Intravenous Q T,Th,Sa-HD  . insulin aspart  0-9 Units Subcutaneous TID WC  . pantoprazole  40 mg Oral Daily  . pravastatin  5 mg Oral QPM  . sevelamer carbonate  1,600 mg Oral With snacks  . sevelamer  carbonate  3,200 mg Oral TID WC  . sodium chloride flush  3 mL Intravenous Q12H    Continuous Infusions: . sodium chloride 10 mL/hr at 03/27/18 2043  . sodium chloride    .  ceFAZolin (ANCEF) IV Stopped (03/28/18 2243)     Time spent: 39mins I have personally reviewed and interpreted on  03/29/2018 daily labs, tele strips, imagings as discussed above under date review session and assessment and plans.  I reviewed all nursing notes, pharmacy notes, consultant notes,  vitals, pertinent old records  I have discussed plan of care as described above with RN , patient  on 03/29/2018   Florencia Reasons MD, PhD  [facility] Pager [phone]. If 7PM-7AM, please contact night-coverage at [email or web address], password [facility] 03/29/2018, 7:21 AM  LOS: 9  days

## 2018-03-29 NOTE — Anesthesia Postprocedure Evaluation (Signed)
Anesthesia Post Note  Patient: Albert [name]  Procedure(s) Performed: RESECTION TRANSMETATARSAL - LEFT SECOND TOE OF LEFT FOOT TOE RESECTION TRANSMETATARSAL FIRST ,SECOND,THIRD TOE RIGHT FOOT REMOVAL SECOND TOE GREAT TOE ,THIRD TOE (Bilateral Toe)     Patient location during evaluation: PACU Anesthesia Type: General Level of consciousness: awake and alert Pain management: pain level controlled Vital Signs Assessment: post-procedure vital signs reviewed and stable Respiratory status: spontaneous breathing, nonlabored ventilation, respiratory function stable and patient connected to nasal cannula oxygen Cardiovascular status: blood pressure returned to baseline and stable Postop Assessment: no apparent nausea or vomiting Anesthetic complications: no    Last Vitals:  Vitals:   03/29/18 1245 03/29/18 1302  BP: 121/76 112/74  Pulse:  91  Resp: 15 14  Temp: 36.7 C 36.6 C  SpO2: 100% 96%    Last Pain:  Vitals:   03/29/18 1302  TempSrc: Oral  PainSc:                  Jermery Caratachea L Johnnathan Hagemeister

## 2018-03-29 NOTE — Progress Notes (Signed)
Patient ID: Albert [name], male   DOB: Jul 20, 1955, 63 y.o.   MRN: [id number]  [facility] Progress Note    Subjective:    Back from OR- looks remarkably well.  No pain yet   Objective:   BP 112/74 (BP Location: Right Arm)   Pulse 91   Temp 97.8 F (36.6 C) (Oral)   Resp 14   Ht 6\' 2"  (1.88 m)   Wt 95.6 kg   SpO2 96%   BMI 27.06 kg/m   Intake/Output: I/O last 3 completed shifts: In: 683.8 [P.O.:580; IV Piggyback:103.8] Out: 2500 [Other:2500]   Intake/Output this shift:  Total I/O In: 450 [I.V.:450] Out: 25 [Blood:25] Weight change: 0.3 kg  Physical Exam: Gen: NAD, awake and alert CVS: tachycardic, irregular, no rub Resp: clear bilaterally no c/w/r Abd: +BS, soft, NT/ND Ext: no edema, LUE AVF +T/B, bilateral feet dressed  Labs: BMET Recent Labs  Lab 03/23/18 0438 03/25/18 0805 03/28/18 0347 03/29/18 0350  NA 135 133* 133* 138  K 5.3* 4.1 4.6 4.0  CL 93* 93* 91* 96*  CO2 26 23 25 29   GLUCOSE 128* 149* 157* 153*  BUN 62* 63* 75* 34*  CREATININE 10.37* 9.08* 10.92* 6.61*  ALBUMIN  --  2.1*  --   --   CALCIUM 8.5* 8.2* 8.4* 8.6*  PHOS  --  5.8*  --   --    CBC Recent Labs  Lab 03/23/18 0438 03/25/18 0805 03/28/18 0347 03/29/18 0350  WBC 7.0 6.6 7.7 7.9  HGB 11.3* 10.0* 9.9* 10.3*  HCT 36.6* 32.2* 31.0* 31.8*  MCV 90.1 89.0 87.3 88.3  PLT 201 188 197 219    @IMGRELPRIORS @ Medications:    . allopurinol  100 mg Oral Daily  . aspirin EC  81 mg Oral Daily  . carvedilol  6.25 mg Oral BID WC  . Chlorhexidine Gluconate Cloth  6 each Topical Q0600  . cinacalcet  30 mg Oral Q supper  . darbepoetin (ARANESP) injection - DIALYSIS  60 mcg Intravenous Q Sat-HD  . diltiazem  180 mg Oral Daily  . docusate sodium  100 mg Oral Daily  . doxercalciferol  3.5 mcg Intravenous Q T,Th,Sa-HD  . heparin  5,000 Units Subcutaneous Q8H  . insulin aspart  0-9 Units Subcutaneous TID WC  . pantoprazole  40 mg Oral Daily  . pravastatin  5 mg Oral  QPM  . sevelamer carbonate  1,600 mg Oral With snacks  . sevelamer carbonate  3,200 mg Oral TID WC  . sodium chloride flush  3 mL Intravenous Q12H   TTS [facility]  Hep none  4h 34min  97kg   L BCF - hectorol 3.5ug  Assessment/ Plan:   1. Bilateral arterial insufficiency- with cellulitis and gangrenous changes.  S/p angio, s/p TMTs on both sides today 2/5. 2. ESRD continue with HD qTTS, on HD- doing well, next HD planned for tomorrow 3. Anemia: cont with ESA 4. CKD-MBD: continue with binders and vit D 5. Nutrition: renal diet 6. Hypertension: stable  7. DM- stable  Madelon Lips MD [facility] pgr [phone] 03/29/2018, 2:23 PM

## 2018-03-29 NOTE — Anesthesia Procedure Notes (Signed)
Procedure Name: LMA Insertion Date/Time: 03/29/2018 11:04 AM Performed by: Alain Marion, CRNA Pre-anesthesia Checklist: Patient identified, Emergency Drugs available, Suction available and Patient being monitored Patient Re-evaluated:Patient Re-evaluated prior to induction Oxygen Delivery Method: Circle System Utilized Preoxygenation: Pre-oxygenation with 100% oxygen Induction Type: IV induction Ventilation: Mask ventilation without difficulty LMA: LMA inserted LMA Size: 5.0 Number of attempts: 1 Airway Equipment and Method: Oral airway Placement Confirmation: positive ETCO2 Tube secured with: Tape Dental Injury: Teeth and Oropharynx as per pre-operative assessment

## 2018-03-30 ENCOUNTER — Encounter (HOSPITAL_COMMUNITY): Payer: Self-pay | Admitting: Vascular Surgery

## 2018-03-30 DIAGNOSIS — I480 Paroxysmal atrial fibrillation: Secondary | ICD-10-CM

## 2018-03-30 LAB — BASIC METABOLIC PANEL
ANION GAP: 21 — AB (ref 5–15)
BUN: 48 mg/dL — ABNORMAL HIGH (ref 8–23)
CO2: 25 mmol/L (ref 22–32)
Calcium: 8.7 mg/dL — ABNORMAL LOW (ref 8.9–10.3)
Chloride: 89 mmol/L — ABNORMAL LOW (ref 98–111)
Creatinine, Ser: 8.45 mg/dL — ABNORMAL HIGH (ref 0.61–1.24)
GFR calc non Af Amer: 6 mL/min — ABNORMAL LOW (ref >60)
GFR, EST AFRICAN AMERICAN: 7 mL/min — AB (ref >60)
GLUCOSE: 163 mg/dL — AB (ref 70–99)
Potassium: 4.6 mmol/L (ref 3.5–5.1)
Sodium: 135 mmol/L (ref 135–145)

## 2018-03-30 LAB — GLUCOSE, CAPILLARY
GLUCOSE-CAPILLARY: 149 mg/dL — AB (ref 70–99)
Glucose-Capillary: 146 mg/dL — ABNORMAL HIGH (ref 70–99)
Glucose-Capillary: 111 mg/dL — ABNORMAL HIGH (ref 70–99)
Glucose-Capillary: 155 mg/dL — ABNORMAL HIGH (ref 70–99)

## 2018-03-30 LAB — CBC
HCT: 33.3 % — ABNORMAL LOW (ref 39.0–52.0)
Hemoglobin: 10.5 g/dL — ABNORMAL LOW (ref 13.0–17.0)
MCH: 28.2 pg (ref 26.0–34.0)
MCHC: 31.5 g/dL (ref 30.0–36.0)
MCV: 89.5 fL (ref 80.0–100.0)
Platelets: 217 10*3/uL (ref 150–400)
RBC: 3.72 MIL/uL — AB (ref 4.22–5.81)
RDW: 14.6 % (ref 11.5–15.5)
WBC: 9.8 10*3/uL (ref 4.0–10.5)
nRBC: 0 % (ref 0.0–0.2)

## 2018-03-30 MED ORDER — DOXERCALCIFEROL 4 MCG/2ML IV SOLN
INTRAVENOUS | Status: AC
  Administered 2018-03-30: 3.5 ug via INTRAVENOUS
  Filled 2018-03-30: qty 2

## 2018-03-30 MED ORDER — WARFARIN - PHARMACIST DOSING INPATIENT: Freq: Every day | Status: DC

## 2018-03-30 MED ORDER — OXYCODONE HCL 5 MG PO TABS
ORAL_TABLET | ORAL | Status: AC
  Filled 2018-03-30: qty 2

## 2018-03-30 MED ORDER — SENNOSIDES-DOCUSATE SODIUM 8.6-50 MG PO TABS
1.0000 | ORAL_TABLET | Freq: Two times a day (BID) | ORAL | Status: DC
  Administered 2018-03-30 – 2018-04-14 (×24): 1 via ORAL
  Filled 2018-03-30 (×27): qty 1

## 2018-03-30 MED ORDER — METOPROLOL TARTRATE 12.5 MG HALF TABLET
12.5000 mg | ORAL_TABLET | Freq: Two times a day (BID) | ORAL | Status: DC
  Administered 2018-03-30 – 2018-04-04 (×8): 12.5 mg via ORAL
  Filled 2018-03-30 (×10): qty 1

## 2018-03-30 MED ORDER — WARFARIN SODIUM 2.5 MG PO TABS
2.5000 mg | ORAL_TABLET | Freq: Once | ORAL | Status: AC
  Administered 2018-03-30: 2.5 mg via ORAL
  Filled 2018-03-30: qty 1

## 2018-03-30 NOTE — Progress Notes (Signed)
Orthopedic Tech Progress Note Patient Details:  Albert [name] 10/03/1955 [id number]  Ortho Devices Type of Ortho Device: Postop shoe/boot Ortho Device/Splint Location: Bilateral post op shoes Ortho Device/Splint Interventions: Application   Post Interventions Patient Tolerated: Well Instructions Provided: Care of device   [name] 03/30/2018, 5:02 PM

## 2018-03-30 NOTE — Progress Notes (Signed)
OT Cancellation Note  Patient Details Name: Albert [name] MRN: [id number] DOB: [birth year]   Cancelled Treatment:    Reason Eval/Treat Not Completed: Other (comment). Pt at HD this morning and now awaiting Darco shoe before able to get pt OOB. Will try again next appropriate/available time  [name] 03/30/2018, 2:07 PM

## 2018-03-30 NOTE — Progress Notes (Addendum)
Inpatient Rehabilitation-Admissions Coordinator    Met with patient at the bedside to discuss team's recommendation for inpatient rehabilitation. Shared booklets, expectations while in CIR, expected length of stay, and anticipated functional level at DC. Pt seems interested but also has not had therapies since his surgery. Pt wants to see how he does with therapies first.   [facility] will follow up with pt after therapy evaluations to see if CIR still appropriate and if so, to begin insurance authorization process.   Will follow up.   Jhonnie Garner, OTR/L  Rehab Admissions Coordinator  [phone] 03/30/2018 2:17 PM

## 2018-03-30 NOTE — Progress Notes (Signed)
PT Cancellation Note  Patient Details Name: Albert [name] MRN: [id number] DOB: 03/20/1955   Cancelled Treatment:    Reason Eval/Treat Not Completed: Other (comment).  Have attempted twice and no shoes are in room to protect surgeries.  Talked with nursing and will try at another time.   Ramond Dial 03/30/2018, 2:09 PM   Mee Hives, PT MS Acute Rehab Dept. Number: [place] and [place]

## 2018-03-30 NOTE — Progress Notes (Signed)
PROGRESS NOTE  Albert [name] DGU:[id number] DOB: 12-15-1955 DOA: 03/20/2018 PCP: Redmond School, MD  HPI/Recap of past 24 hours:  Post op day one, dressing in place, has not started therapy due to awaiting for post op shoe, no fever, pain is tolerable  He has returned from dialysis  He is in afib, heart rate around 110's    Assessment/Plan: Principal Problem:   Cellulitis of left lower extremity Active Problems:   Essential hypertension, benign   Atrial fibrillation (HCC)   ESRD (end stage renal disease) ([place])   Supratherapeutic INR   Necrosis of toe ([place])   PVD (peripheral vascular disease) ([place])  Cellulitis of left lower extremity/bilateral dry gangrenous toes/ severe bilateral peripheral vascular disease: - Transfer from [facility] for arteriogram. - Bilateral angiogram done on 03/28/2018 with balloon angioplasty of the left posterior tibial artery - he received IV Ancef since 1/27, he has remained afebrile no leukocytosis. - s/p right transmetatarsal amputation and left second toe amputation on 2/5 by vascular surgery Dr Oneida Alar -coumadin resumed on 2/6 per vascular surgery -he is started on ancef since admission, currently he is still getting it daily,  abx duration per vascular surgery.  -will follow vascular surgery recommendation  End renal disease on dialysis: Not making urine anymore HD TTS, plan per nephrology, appreciate nephrology input  Anemia of chronic disease hgb stable at baseline on ESA during HD, plan per nephrology  Chronic atrial fibrillation: Continue Coreg and diltiazem,  Heart rate controlled. [place] vasc score 2. Home meds coumadin held, he presented with supertherapeutic INR (INR 4.5 on 1/28),   vascular surgery cleared patient to resume anticoagulation bp low normal , in afib, heart rate 110'2 to 120's,  Continue cardizem, d/c coreg, change coreg to lopressor for rate control and less effect on bp   Diabetes mellitus type  II: noninsulin dependent Home meds glipizide and Tradjenta held in the hospital Good control on sliding scale insulin continue current management  Code Status: full  Family Communication: patient   Disposition Plan: needs Pt/OT eval, needs vascular surgery and nephrology clearance  CIR placement?  Consultants:  Nephrology  Vascular surgery  CIR  Procedures:  HD MWF  Left 2nd toe amputation and right transmetatarsal amputation by vascular surgery on 2/5  Antibiotics:  ancef   Objective: BP 132/69 (BP Location: Right Arm)   Pulse 100   Temp 98 F (36.7 C) (Oral)   Resp 18   Ht 6\' 2"  (1.88 m)   Wt 94.7 kg   SpO2 99%   BMI 26.81 kg/m   Intake/Output Summary (Last 24 hours) at 03/30/2018 1717 Last data filed at 03/30/2018 1206 Gross per 24 hour  Intake 510 ml  Output 1071 ml  Net -561 ml   Filed Weights   03/30/18 0216 03/30/18 0741 03/30/18 1206  Weight: 98.2 kg 96.5 kg 94.7 kg    Exam: Patient is examined daily including today on 03/30/2018, exams remain the same as of yesterday except that has changed    General:  NAD  Cardiovascular: IRRR  Respiratory: CTABL  Abdomen: Soft/ND/NT, positive BS  Musculoskeletal: post op changes, dressing in place  Neuro: alert, oriented   Data Reviewed: Basic Metabolic Panel: Recent Labs  Lab 03/25/18 0805 03/28/18 0347 03/29/18 0350 03/30/18 0314  NA 133* 133* 138 135  K 4.1 4.6 4.0 4.6  CL 93* 91* 96* 89*  CO2 23 25 29 25   GLUCOSE 149* 157* 153* 163*  BUN 63* 75* 34* 48*  CREATININE 9.08* 10.92* 6.61* 8.45*  CALCIUM 8.2* 8.4* 8.6* 8.7*  PHOS 5.8*  --   --   --    Liver Function Tests: Recent Labs  Lab 03/25/18 0805  ALBUMIN 2.1*   No results for input(s): LIPASE, AMYLASE in the last 168 hours. No results for input(s): AMMONIA in the last 168 hours. CBC: Recent Labs  Lab 03/25/18 0805 03/28/18 0347 03/29/18 0350 03/30/18 0314  WBC 6.6 7.7 7.9 9.8  HGB 10.0* 9.9* 10.3* 10.5*  HCT 32.2*  31.0* 31.8* 33.3*  MCV 89.0 87.3 88.3 89.5  PLT 188 197 219 217   Cardiac Enzymes:   No results for input(s): CKTOTAL, CKMB, CKMBINDEX, TROPONINI in the last 168 hours. BNP (last 3 results) No results for input(s): BNP in the last 8760 hours.  ProBNP (last 3 results) No results for input(s): PROBNP in the last 8760 hours.  CBG: Recent Labs  Lab 03/29/18 1618 03/29/18 2121 03/30/18 0621 03/30/18 1234 03/30/18 1706  GLUCAP 133* 130* 146* 111* 155*    Recent Results (from the past 240 hour(s))  MRSA PCR Screening     Status: None   Collection Time: 03/22/18  6:13 AM  Result Value Ref Range Status   MRSA by PCR NEGATIVE NEGATIVE Final    Comment:        The GeneXpert MRSA Assay (FDA approved for NASAL specimens only), is one component of a comprehensive MRSA colonization surveillance program. It is not intended to diagnose MRSA infection nor to guide or monitor treatment for MRSA infections. Performed at [facility], [address], [place], [place] [address]   Surgical pcr screen     Status: None   Collection Time: 03/27/18  5:23 AM  Result Value Ref Range Status   MRSA, PCR NEGATIVE NEGATIVE Final   Staphylococcus aureus NEGATIVE NEGATIVE Final    Comment: (NOTE) The Xpert SA Assay (FDA approved for NASAL specimens in patients 56 years of age and older), is one component of a comprehensive surveillance program. It is not intended to diagnose infection nor to guide or monitor treatment. Performed at [facility], [place] [address] [place], [place] [address]      Studies: No results found.  Scheduled Meds: . allopurinol  100 mg Oral Daily  . aspirin EC  81 mg Oral Daily  . Chlorhexidine Gluconate Cloth  6 each Topical Q0600  . cinacalcet  30 mg Oral Q supper  . darbepoetin (ARANESP) injection - DIALYSIS  60 mcg Intravenous Q Sat-HD  . diltiazem  180 mg Oral Daily  . doxercalciferol  3.5 mcg Intravenous Q T,Th,Sa-HD  . heparin  5,000 Units Subcutaneous  Q8H  . insulin aspart  0-9 Units Subcutaneous TID WC  . metoprolol tartrate  12.5 mg Oral BID  . oxyCODONE      . pantoprazole  40 mg Oral Daily  . pravastatin  5 mg Oral QPM  . senna-docusate  1 tablet Oral BID  . sevelamer carbonate  1,600 mg Oral With snacks  . sevelamer carbonate  3,200 mg Oral TID WC  . sodium chloride flush  3 mL Intravenous Q12H  . warfarin  2.5 mg Oral ONCE-1800  . Warfarin - Pharmacist Dosing Inpatient   Does not apply q1800    Continuous Infusions: . sodium chloride Stopped (03/29/18 1733)  . sodium chloride    .  ceFAZolin (ANCEF) IV Stopped (03/29/18 2159)     Time spent: 46mins I have personally reviewed and interpreted on  03/30/2018  daily labs, tele strips, imagings as discussed above under date review session and assessment and plans.  I reviewed all nursing notes, pharmacy notes, consultant notes,  vitals, pertinent old records  I have discussed plan of care as described above with RN , patient  on 03/30/2018   Florencia Reasons MD, PhD  [facility] Pager [phone]. If 7PM-7AM, please contact night-coverage at [email or web address], password [facility] 03/30/2018, 5:17 PM  LOS: 10 days

## 2018-03-30 NOTE — Discharge Instructions (Signed)

## 2018-03-30 NOTE — Progress Notes (Signed)
ANTICOAGULATION CONSULT NOTE - Initial Consult  Pharmacy Consult:  Coumadin Indication: atrial fibrillation  Allergies  Allergen Reactions  . Metformin And Related Other (See Comments)    Diarrhea, bloody stool, upset stomach.     Patient Measurements: Height: 6\' 2"  (188 cm) Weight: 216 lb 7.9 oz (98.2 kg) IBW/kg (Calculated) : 82.2  Vital Signs: Temp: 98.1 F (36.7 C) (02/06 0516) Temp Source: Oral (02/06 0516) BP: 124/76 (02/06 0516) Pulse Rate: 97 (02/06 0516)  Labs: Recent Labs    03/28/18 0347 03/29/18 0350 03/30/18 0314  HGB 9.9* 10.3* 10.5*  HCT 31.0* 31.8* 33.3*  PLT 197 219 217  LABPROT 19.7* 20.7*  --   INR 1.69 1.81  --   CREATININE 10.92* 6.61* 8.45*    Estimated Creatinine Clearance: 10.3 mL/min (A) (by C-G formula based on SCr of 8.45 mg/dL (H)).   Medical History: Past Medical History:  Diagnosis Date  . Anemia   . Atrial fibrillation ([place])   . Cardiomyopathy    LVEF 50-55% 8/11 - [facility]  . Colonic polyp   . Diverticulosis of colon   . ESRD on hemodialysis ([place])    Dr. Lowanda Foster [facility]  . Essential hypertension, benign   . Gout   . Mixed hyperlipidemia   . Neuropathy    in both feet  . OSA (obstructive sleep apnea)    CPAP  . Renal cell cancer ([place])   . Type 2 diabetes mellitus (HCC)    Type II     Assessment: 89 YOM with history of Afib on Coumadin PTA.  INR elevated on admit and was reversed with Vitamin K 1mg  on 03/25/18.  Patient presented with left heel pain/gangrenous toes and underwent amputation on 03/29/18.  Pharmacy consulted to resume Coumadin today 03/30/18.  INR sub-therapeutic at 1.8.  No bleeding reported.  Home Coumadin regimen:  2.5mg  PO daily  Goal of Therapy:  INR 2-3 Monitor platelets by anticoagulation protocol: Yes   Plan:  Coumadin 2.5mg  PO today Continue heparin SQ until INR therapeutic Daily PT / INR   Micaylah Bertucci D. Mina Marble, PharmD, BCPS, [place] 03/30/2018, 8:17 AM

## 2018-03-30 NOTE — Progress Notes (Addendum)
[facility] [place]  Subjective  - doing well over all.   Objective 124/76 97 98.1 F (36.7 C) (Oral) (!) 23 97%  Intake/Output Summary (Last 24 hours) at 03/30/2018 0732 Last data filed at 03/30/2018 0248 Gross per 24 hour  Intake 960 ml  Output 25 ml  Net 935 ml    B feet with clean dry dressings Doppler left AT, right PT Right groin soft post angiogram    Assessment/Planning: POD # 1 Large amount of necrotic debris and gangrenous tissue toes 1 2 and 3 right foot requiring transmetatarsal amputation of these rather than isolated second toe amputation Left second toe amputation  Will order darco shoe for heel weight bearing on the right. PT for mobility and will change dressings tomorrow HGB stable 10.5 will restart Coumadin for A Fib today  [name] 03/30/2018 7:32 AM --  Laboratory Lab Results: Recent Labs    03/29/18 0350 03/30/18 0314  WBC 7.9 9.8  HGB 10.3* 10.5*  HCT 31.8* 33.3*  PLT 219 217   BMET Recent Labs    03/29/18 0350 03/30/18 0314  NA 138 135  K 4.0 4.6  CL 96* 89*  CO2 29 25  GLUCOSE 153* 163*  BUN 34* 48*  CREATININE 6.61* 8.45*  CALCIUM 8.6* 8.7*    COAG Lab Results  Component Value Date   INR 1.81 03/29/2018   INR 1.69 03/28/2018   INR 1.48 03/27/2018   No results found for: PTT   I have interviewed and examined patient with PA and agree with assessment and plan above.   Baya Lentz C. Donzetta Matters, MD [facility] [place] Office: [phone] Pager: [phone]

## 2018-03-30 NOTE — Progress Notes (Signed)
PT Cancellation Note  Patient Details Name: Albert [name] MRN: [id number] DOB: [birth year]   Cancelled Treatment:    Reason Eval/Treat Not Completed: Other (comment).  Made another attempt and no shoes were available.   Ramond Dial 03/30/2018, 3:05 PM   Mee Hives, PT MS Acute Rehab Dept. Number: [place] and [place]

## 2018-03-30 NOTE — Progress Notes (Signed)
Patient ID: Albert [name], male   DOB: [birth year], 63 y.o.   MRN: [id number]  [name] Progress Note    Subjective:    Seen after HD- Afib 110s-120s, about to get cardizem dose.  Asymptomatic  Objective:   BP 132/69 (BP Location: Right Arm)   Pulse 100   Temp 98 F (36.7 C) (Oral)   Resp 18   Ht 6\' 2"  (1.88 m)   Wt 94.7 kg   SpO2 99%   BMI 26.81 kg/m   Intake/Output: I/O last 3 completed shifts: In: 963.8 [P.O.:360; I.V.:450; IV Piggyback:153.8] Out: 25 [Blood:25]   Intake/Output this shift:  Total I/O In: 0  Out: 1071 [Other:1071] Weight change: 0.9 kg  Physical Exam: Gen: NAD, awake and alert CVS: tachycardic, irregular, no rub Resp: clear bilaterally no c/w/r Abd: +BS, soft, NT/ND Ext: no edema, LUE AVF +T/B, bilateral feet dressed  Labs: BMET Recent Labs  Lab 03/25/18 0805 03/28/18 0347 03/29/18 0350 03/30/18 0314  NA 133* 133* 138 135  K 4.1 4.6 4.0 4.6  CL 93* 91* 96* 89*  CO2 23 25 29 25   GLUCOSE 149* 157* 153* 163*  BUN 63* 75* 34* 48*  CREATININE 9.08* 10.92* 6.61* 8.45*  ALBUMIN 2.1*  --   --   --   CALCIUM 8.2* 8.4* 8.6* 8.7*  PHOS 5.8*  --   --   --    CBC Recent Labs  Lab 03/25/18 0805 03/28/18 0347 03/29/18 0350 03/30/18 0314  WBC 6.6 7.7 7.9 9.8  HGB 10.0* 9.9* 10.3* 10.5*  HCT 32.2* 31.0* 31.8* 33.3*  MCV 89.0 87.3 88.3 89.5  PLT 188 197 219 217    @IMGRELPRIORS @ Medications:    . allopurinol  100 mg Oral Daily  . aspirin EC  81 mg Oral Daily  . carvedilol  6.25 mg Oral BID WC  . Chlorhexidine Gluconate Cloth  6 each Topical Q0600  . cinacalcet  30 mg Oral Q supper  . darbepoetin (ARANESP) injection - DIALYSIS  60 mcg Intravenous Q Sat-HD  . diltiazem  180 mg Oral Daily  . docusate sodium  100 mg Oral Daily  . doxercalciferol  3.5 mcg Intravenous Q T,Th,Sa-HD  . heparin  5,000 Units Subcutaneous Q8H  . insulin aspart  0-9 Units Subcutaneous TID WC  . oxyCODONE      . pantoprazole  40 mg Oral  Daily  . pravastatin  5 mg Oral QPM  . sevelamer carbonate  1,600 mg Oral With snacks  . sevelamer carbonate  3,200 mg Oral TID WC  . sodium chloride flush  3 mL Intravenous Q12H  . warfarin  2.5 mg Oral ONCE-1800  . Warfarin - Pharmacist Dosing Inpatient   Does not apply q1800   TTS [facility]  Hep none  4h 22min  97kg   L BCF - hectorol 3.5ug  Assessment/ Plan:   1. Bilateral arterial insufficiency- with cellulitis and gangrenous changes.  On abx, S/p angio, s/p TMTs on both sides  2/5. 2. ESRD continue with HD qTTS, on HD- doing well, next HD planned for Sat 3. Anemia: cont with ESA 4. CKD-MBD: continue with binders and vit D 5. Nutrition: renal diet 6. Hypertension: stable  7. DM- stable 8. Afib: on cardizem, restarting Coumadin per pharmacy  [place] pgr [phone] 03/30/2018, 1:16 PM

## 2018-03-31 LAB — CBC
HEMATOCRIT: 32.5 % — AB (ref 39.0–52.0)
Hemoglobin: 10.1 g/dL — ABNORMAL LOW (ref 13.0–17.0)
MCH: 27.8 pg (ref 26.0–34.0)
MCHC: 31.1 g/dL (ref 30.0–36.0)
MCV: 89.5 fL (ref 80.0–100.0)
Platelets: 219 10*3/uL (ref 150–400)
RBC: 3.63 MIL/uL — ABNORMAL LOW (ref 4.22–5.81)
RDW: 14.6 % (ref 11.5–15.5)
WBC: 10.7 10*3/uL — ABNORMAL HIGH (ref 4.0–10.5)
nRBC: 0 % (ref 0.0–0.2)

## 2018-03-31 LAB — BASIC METABOLIC PANEL
Anion gap: 20 — ABNORMAL HIGH (ref 5–15)
BUN: 28 mg/dL — ABNORMAL HIGH (ref 8–23)
CHLORIDE: 89 mmol/L — AB (ref 98–111)
CO2: 27 mmol/L (ref 22–32)
CREATININE: 6.1 mg/dL — AB (ref 0.61–1.24)
Calcium: 8.9 mg/dL (ref 8.9–10.3)
GFR calc Af Amer: 10 mL/min — ABNORMAL LOW (ref >60)
GFR calc non Af Amer: 9 mL/min — ABNORMAL LOW (ref >60)
Glucose, Bld: 148 mg/dL — ABNORMAL HIGH (ref 70–99)
POTASSIUM: 4.5 mmol/L (ref 3.5–5.1)
Sodium: 136 mmol/L (ref 135–145)

## 2018-03-31 LAB — GLUCOSE, CAPILLARY
Glucose-Capillary: 120 mg/dL — ABNORMAL HIGH (ref 70–99)
Glucose-Capillary: 144 mg/dL — ABNORMAL HIGH (ref 70–99)
Glucose-Capillary: 140 mg/dL — ABNORMAL HIGH (ref 70–99)
Glucose-Capillary: 137 mg/dL — ABNORMAL HIGH (ref 70–99)

## 2018-03-31 LAB — PROTIME-INR
INR: 2.4
Prothrombin Time: 25.8 seconds — ABNORMAL HIGH (ref 11.4–15.2)

## 2018-03-31 MED ORDER — WARFARIN 0.5 MG HALF TABLET
0.5000 mg | ORAL_TABLET | Freq: Once | ORAL | Status: AC
  Administered 2018-03-31: 0.5 mg via ORAL
  Filled 2018-03-31: qty 1

## 2018-03-31 NOTE — Progress Notes (Signed)
ANTICOAGULATION & ANTIBIOTIC CONSULT NOTE  Pharmacy Consult:  Coumadin + Ancef  Indication: atrial fibrillation + LLE cellulitis/DFI  Allergies  Allergen Reactions  . Metformin And Related Other (See Comments)    Diarrhea, bloody stool, upset stomach.     Patient Measurements: Height: 6\' 2"  (188 cm) Weight: 212 lb 8.4 oz (96.4 kg) IBW/kg (Calculated) : 82.2  Vital Signs: Temp: 98.3 F (36.8 C) (02/07 0533) Temp Source: Oral (02/07 0533) BP: 117/73 (02/07 0533) Pulse Rate: 95 (02/07 0533)  Labs: Recent Labs    03/29/18 0350 03/30/18 0314 03/31/18 0336  HGB 10.3* 10.5* 10.1*  HCT 31.8* 33.3* 32.5*  PLT 219 217 219  LABPROT 20.7*  --  25.8*  INR 1.81  --  2.40  CREATININE 6.61* 8.45* 6.10*    Estimated Creatinine Clearance: 14.2 mL/min (A) (by C-G formula based on SCr of 6.1 mg/dL (H)).   Assessment: 4 YOM with history of Afib on Coumadin PTA.  INR elevated on admit and was reversed with Vitamin K 1mg  on 03/25/18.  Patient presented with left heel pain/gangrenous toes and underwent amputation on 03/29/18.  Pharmacy consulted to resume Coumadin on 03/30/18.  INR increased significantly from 1.8 to 2.4 after one dose of Coumadin.  Appears very sensitive to Coumadin and will need doses lower than home dose (INR supra-therapeutic on admit).  No bleeding reported.  Home Coumadin regimen:  2.5mg  PO daily    Pharmacy also consulted to dose Ancef for LLE cellulitis/DFI/gangrenous toe.  Patient is s/p amputation on 03/29/18.  He has ESRD on TTS HD and tolerated his last session.  Afebrile, WBC WNL.   Ancef 1/27>>  1/29 MRSA PCR - negative 2/3 MRSA PCR - negative 1/28 HIV non-reactive   Goal of Therapy:  INR 2-3 Monitor platelets by anticoagulation protocol: Yes  Resolution of infection    Plan:  Coumadin 0.5mg  PO today D/C heparin SQ as INR is therapeutic Daily PT / INR  Ancef 1gm IV Q24H Pharmacy will sign off as dosage adjustment is unnecessary.  LOT per MD.     Remigio Eisenmenger D. Mina Marble, PharmD, BCPS, [place] 03/31/2018, 10:22 AM

## 2018-03-31 NOTE — Care Management Important Message (Signed)
Important Message  Patient Details  Name: Albert [name] MRN: [id number] Date of Birth: [birth year]   Medicare Important Message Given:  Yes    Ronan Duecker P [place] 03/31/2018, 1:36 PM

## 2018-03-31 NOTE — Progress Notes (Signed)
PROGRESS NOTE  Albert [name] QMG:[id number] DOB: June 16, 1955 DOA: 03/20/2018 PCP: Redmond School, MD  HPI/Recap of past 24 hours:  Post op day two, worked with physical therapy, per physical therapy, patient heart rate increased with getting therapy, He is in afib, heart rate around low 100's at rest, bp stable  No fever   Assessment/Plan: Principal Problem:   Cellulitis of left lower extremity Active Problems:   Essential hypertension, benign   Atrial fibrillation (HCC)   ESRD (end stage renal disease) ([place])   Supratherapeutic INR   Necrosis of toe ([place])   PVD (peripheral vascular disease) ([place])  Cellulitis of left lower extremity/bilateral dry gangrenous toes/ severe bilateral peripheral vascular disease: - Transfer from [facility] for arteriogram. - Bilateral angiogram done on 03/28/2018 with balloon angioplasty of the left posterior tibial artery - he received IV Ancef since 1/27, he has remained afebrile no leukocytosis. - s/p right transmetatarsal amputation and left second toe amputation on 2/5 by vascular surgery Dr Oneida Alar -coumadin resumed on 2/6 per vascular surgery -he is started on ancef since admission, currently he is still getting it daily,  abx duration per vascular surgery.  -will follow vascular surgery recommendation  End renal disease on dialysis: Not making urine anymore HD TTS, plan per nephrology, appreciate nephrology input  Anemia of chronic disease hgb stable at baseline on ESA during HD, plan per nephrology  Chronic atrial fibrillation: Continue Coreg and diltiazem,  Heart rate controlled. [place] vasc score 2. Home meds coumadin held, he presented with supertherapeutic INR (INR 4.5 on 1/28),   vascular surgery cleared patient to resume anticoagulation  Continue cardizem, d/c coreg, change coreg to lopressor for rate control and less effect on bp, titrate up lopressor if bp allows    Diabetes mellitus type II: noninsulin  dependent Home meds glipizide and Tradjenta held in the hospital Good control on sliding scale insulin continue current management  Code Status: full  Family Communication: patient   Disposition Plan: CIR placement , likely on monday    Consultants:  Nephrology  Vascular surgery  CIR  Procedures:  HD MWF  Left 2nd toe amputation and right transmetatarsal amputation by vascular surgery on 2/5  Antibiotics:  ancef   Objective: BP 121/69 (BP Location: Right Arm)   Pulse (!) 116   Temp 98.1 F (36.7 C) (Oral)   Resp (!) 23   Ht 6\' 2"  (1.88 m)   Wt 96.4 kg   SpO2 98%   BMI 27.29 kg/m   Intake/Output Summary (Last 24 hours) at 03/31/2018 1735 Last data filed at 03/31/2018 0300 Gross per 24 hour  Intake 180 ml  Output -  Net 180 ml   Filed Weights   03/30/18 0741 03/30/18 1206 03/31/18 0533  Weight: 96.5 kg 94.7 kg 96.4 kg    Exam: Patient is examined daily including today on 03/31/2018, exams remain the same as of yesterday except that has changed    General:  NAD  Cardiovascular: IRRR  Respiratory: CTABL  Abdomen: Soft/ND/NT, positive BS  Musculoskeletal: post op changes, dressing in place  Neuro: alert, oriented   Data Reviewed: Basic Metabolic Panel: Recent Labs  Lab 03/25/18 0805 03/28/18 0347 03/29/18 0350 03/30/18 0314 03/31/18 0336  NA 133* 133* 138 135 136  K 4.1 4.6 4.0 4.6 4.5  CL 93* 91* 96* 89* 89*  CO2 23 25 29 25 27   GLUCOSE 149* 157* 153* 163* 148*  BUN 63* 75* 34* 48* 28*  CREATININE 9.08* 10.92* 6.61*  8.45* 6.10*  CALCIUM 8.2* 8.4* 8.6* 8.7* 8.9  PHOS 5.8*  --   --   --   --    Liver Function Tests: Recent Labs  Lab 03/25/18 0805  ALBUMIN 2.1*   No results for input(s): LIPASE, AMYLASE in the last 168 hours. No results for input(s): AMMONIA in the last 168 hours. CBC: Recent Labs  Lab 03/25/18 0805 03/28/18 0347 03/29/18 0350 03/30/18 0314 03/31/18 0336  WBC 6.6 7.7 7.9 9.8 10.7*  HGB 10.0* 9.9* 10.3*  10.5* 10.1*  HCT 32.2* 31.0* 31.8* 33.3* 32.5*  MCV 89.0 87.3 88.3 89.5 89.5  PLT 188 197 219 217 219   Cardiac Enzymes:   No results for input(s): CKTOTAL, CKMB, CKMBINDEX, TROPONINI in the last 168 hours. BNP (last 3 results) No results for input(s): BNP in the last 8760 hours.  ProBNP (last 3 results) No results for input(s): PROBNP in the last 8760 hours.  CBG: Recent Labs  Lab 03/30/18 1706 03/30/18 2139 03/31/18 0558 03/31/18 1102 03/31/18 1639  GLUCAP 155* 149* 120* 144* 140*    Recent Results (from the past 240 hour(s))  MRSA PCR Screening     Status: None   Collection Time: 03/22/18  6:13 AM  Result Value Ref Range Status   MRSA by PCR NEGATIVE NEGATIVE Final    Comment:        The GeneXpert MRSA Assay (FDA approved for NASAL specimens only), is one component of a comprehensive MRSA colonization surveillance program. It is not intended to diagnose MRSA infection nor to guide or monitor treatment for MRSA infections. Performed at [facility], [address], [place], [place] [address]   Surgical pcr screen     Status: None   Collection Time: 03/27/18  5:23 AM  Result Value Ref Range Status   MRSA, PCR NEGATIVE NEGATIVE Final   Staphylococcus aureus NEGATIVE NEGATIVE Final    Comment: (NOTE) The Xpert SA Assay (FDA approved for NASAL specimens in patients 65 years of age and older), is one component of a comprehensive surveillance program. It is not intended to diagnose infection nor to guide or monitor treatment. Performed at [facility], [place] [address], [place], [place] [address]      Studies: No results found.  Scheduled Meds: . allopurinol  100 mg Oral Daily  . aspirin EC  81 mg Oral Daily  . Chlorhexidine Gluconate Cloth  6 each Topical Q0600  . cinacalcet  30 mg Oral Q supper  . darbepoetin (ARANESP) injection - DIALYSIS  60 mcg Intravenous Q Sat-HD  . diltiazem  180 mg Oral Daily  . doxercalciferol  3.5 mcg Intravenous Q  T,Th,Sa-HD  . insulin aspart  0-9 Units Subcutaneous TID WC  . metoprolol tartrate  12.5 mg Oral BID  . pantoprazole  40 mg Oral Daily  . pravastatin  5 mg Oral QPM  . senna-docusate  1 tablet Oral BID  . sevelamer carbonate  1,600 mg Oral With snacks  . sevelamer carbonate  3,200 mg Oral TID WC  . sodium chloride flush  3 mL Intravenous Q12H  . warfarin  0.5 mg Oral ONCE-1800  . Warfarin - Pharmacist Dosing Inpatient   Does not apply q1800    Continuous Infusions: . sodium chloride Stopped (03/29/18 1733)  . sodium chloride    .  ceFAZolin (ANCEF) IV 1 g (03/30/18 2121)     Time spent: 60mins I have personally reviewed and interpreted on  03/31/2018 daily labs, tele strips, imagings as  discussed above under date review session and assessment and plans.  I reviewed all nursing notes, pharmacy notes, consultant notes,  vitals, pertinent old records  I have discussed plan of care as described above with RN , patient  on 03/31/2018   Florencia Reasons MD, PhD  [facility] Pager [phone]. If 7PM-7AM, please contact night-coverage at [email or web address], password [facility] 03/31/2018, 5:35 PM  LOS: 11 days

## 2018-03-31 NOTE — Progress Notes (Signed)
Orthopedic Tech Progress Note Patient Details:  Albert [name] 1955/09/03 [id number]  Ortho Devices Type of Ortho Device: Darco shoe Ortho Device/Splint Location: right foot Ortho Device/Splint Interventions: Application, Adjustment, Ordered   Post Interventions Patient Tolerated: Well Instructions Provided: Care of device, Adjustment of device   Janit Pagan 03/31/2018, 9:17 AM

## 2018-03-31 NOTE — Evaluation (Signed)
Occupational Therapy Evaluation Patient Details Name: Albert [name] MRN: [id number] DOB: [birth year] Today's Date: 03/31/2018    History of Present Illness Pt is a 63 y.o. M with significant PMH of ESRD, PVD, who presents s/p right transmetarsal amputation and left amputation of 2nd toe. Of note, pt also has heel ulcer that is superficial over the Achilles.   Clinical Impression   Pt with decline in function and safety with ADLs and ADL mobility with decreased strength, balance ane endurance. Pt is limited by pain in L heel ulcer, however pt is eager to participate in therapy. Pt would benefit from acute OT services to address impairments to maximize level of function and safety    Follow Up Recommendations  CIR    Equipment Recommendations  Other (comment)(TBD at next venue of care)    Recommendations for Other Services       Precautions / Restrictions Precautions Precautions: Fall Required Braces or Orthoses: Other Brace Other Brace: right darco, left post op shoe Restrictions Weight Bearing Restrictions: Yes RLE Weight Bearing: Partial weight bearing RLE Partial Weight Bearing Percentage or Pounds: (heel weightbearing with Darco) LLE Weight Bearing: Weight bearing as tolerated      Mobility Bed Mobility Overal bed mobility: Needs Assistance Bed Mobility: Supine to Sit     Supine to sit: Supervision     General bed mobility comments: pt in recliner upon arrival  Transfers Overall transfer level: Needs assistance Equipment used: Rolling walker (2 wheeled);None Transfers: Squat Pivot Transfers     Squat pivot transfers: Mod assist     General transfer comment: cues for correct hand placement. Attempted x 3 before able to complete transfer    Balance Overall balance assessment: Needs assistance   Sitting balance-Leahy Scale: Good     Standing balance support: Bilateral upper extremity supported;During functional activity Standing balance-Leahy Scale:  Poor                             ADL either performed or assessed with clinical judgement   ADL Overall ADL's : Needs assistance/impaired     Grooming: Wash/dry hands;Wash/dry face;Sitting;Supervision/safety;Set up   Upper Body Bathing: Min guard;Sitting   Lower Body Bathing: Maximal assistance   Upper Body Dressing : Min guard;Sitting   Lower Body Dressing: Total assistance   Toilet Transfer: Squat-pivot;Moderate assistance;Cueing for safety Toilet Transfer Details (indicate cue type and reason): simulated Toileting- Clothing Manipulation and Hygiene: Total assistance       Functional mobility during ADLs: Moderate assistance       Vision Patient Visual Report: No change from baseline       Perception     Praxis      Pertinent Vitals/Pain Pain Assessment: 0-10 Pain Score: 7  Faces Pain Scale: Hurts even more Pain Location: left heel ulcer Pain Descriptors / Indicators: Grimacing;Guarding Pain Intervention(s): Limited activity within patient's tolerance;Monitored during session;Repositioned     Hand Dominance Right   Extremity/Trunk Assessment Upper Extremity Assessment Upper Extremity Assessment: Generalized weakness RUE Deficits / Details: 3+/5 grossly LUE Deficits / Details: 3+/5 grossly   Lower Extremity Assessment Lower Extremity Assessment: Defer to PT evaluation RLE Deficits / Details: Strength 4/5 (ankle DF/PF not assessed) LLE Deficits / Details: Strength 4/5 (ankle DF/PF not assessed)       Communication Communication Communication: No difficulties   Cognition Arousal/Alertness: Awake/alert Behavior During Therapy: WFL for tasks assessed/performed Overall Cognitive Status: Within Functional Limits for tasks assessed  General Comments       Exercises     Shoulder Instructions      Home Living Family/patient expects to be discharged to:: Private residence Living  Arrangements: Alone Available Help at Discharge: Family;Available PRN/intermittently Type of Home: Apartment Home Access: Elevator     Home Layout: One level     Bathroom Shower/Tub: Teacher, early years/pre: Standard     Home Equipment: Environmental consultant - 2 wheels;Shower seat;Wheelchair - manual          Prior Functioning/Environment Level of Independence: Independent with assistive device(s)        Comments: "furniture walks," in apartment, uses wheelchair for community mobility, independent with ADLs/selfcare        OT Problem List: Decreased strength;Decreased activity tolerance;Decreased knowledge of use of DME or AE;Pain;Decreased coordination;Impaired balance (sitting and/or standing)      OT Treatment/Interventions: Self-care/ADL training;Therapeutic exercise;DME and/or AE instruction;Therapeutic activities;Patient/family education    OT Goals(Current goals can be found in the care plan section) Acute Rehab OT Goals Patient Stated Goal: "decrease pain." OT Goal Formulation: With patient Time For Goal Achievement: 04/14/18 Potential to Achieve Goals: Good ADL Goals Pt Will Perform Upper Body Bathing: with supervision;with set-up;sitting Pt Will Perform Lower Body Bathing: with mod assist;sitting/lateral leans Pt Will Perform Upper Body Dressing: with supervision;with set-up;sitting Pt Will Transfer to Toilet: with min assist;stand pivot transfer;ambulating Pt Will Perform Toileting - Clothing Manipulation and hygiene: with max assist;with mod assist;sitting/lateral leans;sit to/from stand  OT Frequency: Min 2X/week   Barriers to D/C: Decreased caregiver support          Co-evaluation              AM-PAC OT "6 Clicks" Daily Activity     Outcome Measure Help from another person eating meals?: None Help from another person taking care of personal grooming?: None Help from another person toileting, which includes using toliet, bedpan, or urinal?:  Total Help from another person bathing (including washing, rinsing, drying)?: A Lot Help from another person to put on and taking off regular upper body clothing?: A Little Help from another person to put on and taking off regular lower body clothing?: Total 6 Click Score: 15   End of Session Equipment Utilized During Treatment: Gait belt;Rolling walker  Activity Tolerance: No increased pain Patient left: in chair;with call bell/phone within reach  OT Visit Diagnosis: Unsteadiness on feet (R26.81);Other abnormalities of gait and mobility (R26.89);Muscle weakness (generalized) (M62.81);Pain Pain - Right/Left: (bilaterally) Pain - part of body: Ankle and joints of foot                Time: 1031-1059 OT Time Calculation (min): 28 min Charges:  OT General Charges $OT Visit: 1 Visit OT Evaluation $OT Eval Moderate Complexity: 1 Mod OT Treatments $Self Care/Home Management : 8-22 mins    [name] 03/31/2018, 12:02 PM

## 2018-03-31 NOTE — Progress Notes (Signed)
[facility]    NEPHROLOGY PROGRESS NOTE  SUBJECTIVE: Currently feels well.  Denies chest pain, shortness of breath, nausea, vomiting, diarrhea or dysuria.  All other review of systems are negative.     OBJECTIVE:  Vitals:   03/31/18 0533 03/31/18 1252  BP: 117/73 121/69  Pulse: 95 (!) 116  Resp: 20 (!) 23  Temp: 98.3 F (36.8 C) 98.1 F (36.7 C)  SpO2: 97% 98%    Intake/Output Summary (Last 24 hours) at 03/31/2018 1349 Last data filed at 03/31/2018 0300 Gross per 24 hour  Intake 180 ml  Output -  Net 180 ml      Genearl:  AAOx3 NAD HEENT: MMM [place] AT anicteric sclera Neck:  No JVD, no adenopathy CV:  Heart RRR  Lungs:  L/S CTA bilaterally Abd:  abd SNT/ND with normal BS GU:  Bladder non-palpable Extremities:  No LE edema.  Left upper extremity AV fistula with a good thrill and bruit. Skin:  No skin rash  MEDICATIONS:   Current Facility-Administered Medications:  .  0.9 %  sodium chloride infusion, , Intravenous, Continuous, Fields, Jessy Oto, MD, Stopped at 03/29/18 1733 .  0.9 %  sodium chloride infusion, 250 mL, Intravenous, PRN, Elam Dutch, MD .  acetaminophen (TYLENOL) tablet 650 mg, 650 mg, Oral, Q4H PRN, Elam Dutch, MD .  allopurinol (ZYLOPRIM) tablet 100 mg, 100 mg, Oral, Daily, Fields, Jessy Oto, MD, 100 mg at 03/31/18 1008 .  aspirin EC tablet 81 mg, 81 mg, Oral, Daily, Elam Dutch, MD, 81 mg at 03/31/18 1008 .  ceFAZolin (ANCEF) IVPB 1 g/50 mL premix, 1 g, Intravenous, Q24H, Fields, Jessy Oto, MD, Last Rate: 100 mL/hr at 03/30/18 2121, 1 g at 03/30/18 2121 .  Chlorhexidine Gluconate Cloth 2 % PADS 6 each, 6 each, Topical, Q0600, Elam Dutch, MD, Stopped at 03/31/18 0425 .  cinacalcet (SENSIPAR) tablet 30 mg, 30 mg, Oral, Q supper, Elam Dutch, MD, 30 mg at 03/30/18 1727 .  Darbepoetin Alfa (ARANESP) injection 60 mcg, 60 mcg, Intravenous, Q Sat-HD, Elam Dutch, MD, 60 mcg at 03/25/18 1140 .  diltiazem  (CARDIZEM CD) 24 hr capsule 180 mg, 180 mg, Oral, Daily, Elam Dutch, MD, 180 mg at 03/31/18 1008 .  doxercalciferol (HECTOROL) injection 3.5 mcg, 3.5 mcg, Intravenous, Q T,Th,Sa-HD, Fields, Charles E, MD, 3.5 mcg at 03/30/18 1011 .  hydrALAZINE (APRESOLINE) injection 5 mg, 5 mg, Intravenous, Q20 Min PRN, Fields, Jessy Oto, MD .  HYDROcodone-acetaminophen (NORCO/VICODIN) 5-325 MG per tablet 1-2 tablet, 1-2 tablet, Oral, Q6H PRN, Elam Dutch, MD, 2 tablet at 03/31/18 [id number] .  insulin aspart (novoLOG) injection 0-9 Units, 0-9 Units, Subcutaneous, TID WC, Elam Dutch, MD, 1 Units at 03/31/18 1126 .  labetalol (NORMODYNE,TRANDATE) injection 10 mg, 10 mg, Intravenous, Q10 min PRN, Fields, Charles E, MD .  metoprolol tartrate (LOPRESSOR) injection 5 mg, 5 mg, Intravenous, Q5 min PRN, Elam Dutch, MD, 5 mg at 03/30/18 1441 .  metoprolol tartrate (LOPRESSOR) tablet 12.5 mg, 12.5 mg, Oral, BID, Florencia Reasons, MD, 12.5 mg at 03/31/18 1007 .  ondansetron (ZOFRAN) injection 4 mg, 4 mg, Intravenous, Q6H PRN, Fields, Charles E, MD .  oxyCODONE (Oxy IR/ROXICODONE) immediate release tablet 5-10 mg, 5-10 mg, Oral, Q4H PRN, Elam Dutch, MD, 10 mg at 03/30/18 0801 .  pantoprazole (PROTONIX) EC tablet 40 mg, 40 mg, Oral, Daily, Elam Dutch, MD, 40 mg at 03/31/18 1008 .  phenol (CHLORASEPTIC) mouth spray 1  spray, 1 spray, Mouth/Throat, PRN, Fields, Charles E, MD .  pravastatin (PRAVACHOL) tablet 5 mg, 5 mg, Oral, QPM, Elam Dutch, MD, 5 mg at 03/30/18 1727 .  senna-docusate (Senokot-S) tablet 1 tablet, 1 tablet, Oral, BID, Florencia Reasons, MD, 1 tablet at 03/31/18 1008 .  sevelamer carbonate (RENVELA) tablet 1,600 mg, 1,600 mg, Oral, With snacks, Elam Dutch, MD, 1,600 mg at 03/26/18 6948 .  sevelamer carbonate (RENVELA) tablet 3,200 mg, 3,200 mg, Oral, TID WC, Elam Dutch, MD, 3,200 mg at 03/31/18 1126 .  sodium chloride flush (NS) 0.9 % injection 3 mL, 3 mL, Intravenous, Q12H,  Fields, Charles E, MD, 3 mL at 03/31/18 1000 .  sodium chloride flush (NS) 0.9 % injection 3 mL, 3 mL, Intravenous, PRN, Fields, Charles E, MD .  warfarin (COUMADIN) tablet 0.5 mg, 0.5 mg, Oral, ONCE-1800, Dang, Thuy D, RPH .  Warfarin - Pharmacist Dosing Inpatient, , Does not apply, q1800, Tyrone Apple, RPH     LABS:   CBC Latest Ref Rng & Units 03/31/2018 03/30/2018 03/29/2018  WBC 4.0 - 10.5 K/uL 10.7(H) 9.8 7.9  Hemoglobin 13.0 - 17.0 g/dL 10.1(L) 10.5(L) 10.3(L)  Hematocrit 39.0 - 52.0 % 32.5(L) 33.3(L) 31.8(L)  Platelets 150 - 400 K/uL 219 217 219    CMP Latest Ref Rng & Units 03/31/2018 03/30/2018 03/29/2018  Glucose 70 - 99 mg/dL 148(H) 163(H) 153(H)  BUN 8 - 23 mg/dL 28(H) 48(H) 34(H)  Creatinine 0.61 - 1.24 mg/dL 6.10(H) 8.45(H) 6.61(H)  Sodium 135 - 145 mmol/L 136 135 138  Potassium 3.5 - 5.1 mmol/L 4.5 4.6 4.0  Chloride 98 - 111 mmol/L 89(L) 89(L) 96(L)  CO2 22 - 32 mmol/L 27 25 29   Calcium 8.9 - 10.3 mg/dL 8.9 8.7(L) 8.6(L)  Total Protein 6.5 - 8.1 g/dL - - -  Total Bilirubin 0.3 - 1.2 mg/dL - - -  Alkaline Phos 38 - 126 U/L - - -  AST 15 - 41 U/L - - -  ALT 17 - 63 U/L - - -    Lab Results  Component Value Date   PTH 350.0 (H) 03/21/2007   CALCIUM 8.9 03/31/2018   CAION 0.95 (L) 01/18/2017   PHOS 5.8 (H) 03/25/2018       Component Value Date/Time   COLORURINE YELLOW 10/12/2013 1045   APPEARANCEUR CLEAR 10/12/2013 1045   LABSPEC 1.015 10/12/2013 1045   PHURINE 8.0 10/12/2013 1045   GLUCOSEU NEGATIVE 10/12/2013 1045   HGBUR MODERATE (A) 10/12/2013 1045   BILIRUBINUR NEGATIVE 10/12/2013 1045   KETONESUR NEGATIVE 10/12/2013 1045   PROTEINUR 30 (A) 10/12/2013 1045   UROBILINOGEN 1.0 10/12/2013 1045   NITRITE NEGATIVE 10/12/2013 1045   LEUKOCYTESUR NEGATIVE 10/12/2013 1045      Component Value Date/Time   PHART 7.405 10/12/2013 0352   PCO2ART 32.2 (L) 10/12/2013 0352   PO2ART 111.0 (H) 10/12/2013 0352   HCO3 20.4 10/12/2013 0352   TCO2 28 01/18/2017 1346    ACIDBASEDEF 3.9 (H) 10/12/2013 0352   O2SAT 99.1 10/12/2013 0352       Component Value Date/Time   IRON 46 03/16/2007 0610   TIBC 358 03/16/2007 0610   FERRITIN 237 03/16/2007 0610   IRONPCTSAT 13 (L) 03/16/2007 0610       ASSESSMENT/PLAN:     1. Bilateral arterial insufficiency- with cellulitis and gangrenous changes.  On abx, S/p angio, s/p TMTs on both sides  2/5. 2. ESRD continue with HD qTTS, on HD- doing well, next HD planned for  Sat 3. Anemia: cont with ESA.  Hemoglobin is at goal. 4. CKD-MBD: continue with binders and vit D 5. Nutrition: renal diet 6. Hypertension: stable  7. DM- stable 8. Afib: on cardizem, restarting Coumadin per pharmacy    [facility], DO, [place]

## 2018-03-31 NOTE — Progress Notes (Signed)
  Progress Note    03/31/2018 8:58 AM 2 Days Post-Op  Subjective: Having pain in amputation sites  Vitals:   03/30/18 2052 03/31/18 0533  BP: 106/74 117/73  Pulse: (!) 116 95  Resp: 20 20  Temp: 98.1 F (36.7 C) 98.3 F (36.8 C)  SpO2: 98% 97%    Physical Exam: Awake alert oriented Nonlabored respirations Bilateral groins are soft and palpable femoral pulses Right toe amputation site healing well with sutures Left second toe amputation site is healing well there is a heel ulcer that is superficial over the Achilles  CBC    Component Value Date/Time   WBC 10.7 (H) 03/31/2018 0336   RBC 3.63 (L) 03/31/2018 0336   HGB 10.1 (L) 03/31/2018 0336   HCT 32.5 (L) 03/31/2018 0336   PLT 219 03/31/2018 0336   MCV 89.5 03/31/2018 0336   MCH 27.8 03/31/2018 0336   MCHC 31.1 03/31/2018 0336   RDW 14.6 03/31/2018 0336   LYMPHSABS 0.7 03/20/2018 1931   MONOABS 0.7 03/20/2018 1931   EOSABS 0.1 03/20/2018 1931   BASOSABS 0.0 03/20/2018 1931    BMET    Component Value Date/Time   NA 136 03/31/2018 0336   K 4.5 03/31/2018 0336   CL 89 (L) 03/31/2018 0336   CO2 27 03/31/2018 0336   GLUCOSE 148 (H) 03/31/2018 0336   BUN 28 (H) 03/31/2018 0336   CREATININE 6.10 (H) 03/31/2018 0336   CALCIUM 8.9 03/31/2018 0336   CALCIUM 9.0 03/21/2007 0415   GFRNONAA 9 (L) 03/31/2018 0336   GFRAA 10 (L) 03/31/2018 0336    INR    Component Value Date/Time   INR 2.40 03/31/2018 0336     Intake/Output Summary (Last 24 hours) at 03/31/2018 0858 Last data filed at 03/31/2018 0300 Gross per 24 hour  Intake 180 ml  Output 1071 ml  Net -891 ml     Assessment:  63 y.o. male is s/p #1 left transmetatarsal amputation second toe  2.  Right transmetatarsal amputation of toes 1 2 and 3  2 Days Post-Op  Plan: Okay for ambulation with postop shoes Continue aspirin Okay for Coumadin to therapeutic INR from surgical standpoint He remains high risk for proximal amputations bilaterally and I  have discussed this with him he does seem to understand well.   Albert [name] C. Donzetta Matters, MD [facility] Office: [phone] Pager: [phone]  03/31/2018 8:58 AM

## 2018-03-31 NOTE — Progress Notes (Signed)
Inpatient Rehabilitation-Admissions Coordinator   [facility] met with pt at the bedside as therapies are recommending inpatient rehab. Pt remains interested in the program and wants to pursue CIR at this time. AC will begin insurance authorization for possible admit.   Jhonnie Garner, OTR/L  Rehab Admissions Coordinator  [phone] 03/31/2018 12:38 PM

## 2018-03-31 NOTE — Evaluation (Signed)
Physical Therapy Evaluation Patient Details Name: Albert [name] MRN: [id number] DOB: [birth year] Today's Date: 03/31/2018   History of Present Illness  Pt is a 63 y.o. M with significant PMH of ESRD, PVD, who presents s/p right transmetarsal amputation and left amputation of 2nd toe. Of note, pt also has heel ulcer that is superficial over the Achilles.  Clinical Impression  Pt admitted with above diagnosis. Pt currently with functional limitations due to the deficits listed below (see PT Problem List). Prior to admission, pt ambulatory with household distances and used wheelchair for mobility mobility. On PT evaluation, pt presenting with decreased functional mobility secondary to pain (left heel ulcer particularly), weakness, and balance impairments. Pt requiring modA for squat pivot transfer towards left. Gait training deferred as right darco shoe not in room (PT ordered following session). Also noted minimal sanguineous drainage from right foot surgical site. Pt will benefit from CIR to maximize functional independence to progress to modified independent level using wheelchair. Suspect pt will progress well based on motivation, age, and PLOF.  Will follow acutely to progress mobility as tolerated.    Follow Up Recommendations CIR    Equipment Recommendations  None recommended by PT    Recommendations for Other Services       Precautions / Restrictions Precautions Precautions: Fall Required Braces or Orthoses: Other Brace Other Brace: right darco, left post op shoe Restrictions Weight Bearing Restrictions: Yes RLE Weight Bearing: Partial weight bearing RLE Partial Weight Bearing Percentage or Pounds: (heel weightbearing with Darco) LLE Weight Bearing: Weight bearing as tolerated(with post op shoe)      Mobility  Bed Mobility Overal bed mobility: Needs Assistance Bed Mobility: Supine to Sit     Supine to sit: Supervision     General bed mobility comments: HOB  up  Transfers Overall transfer level: Needs assistance Equipment used: None Transfers: Squat Pivot Transfers     Squat pivot transfers: Mod assist     General transfer comment: modA for squat pivot transfer from bed to chair towards left. requires cues for sequencing and hand placement  Ambulation/Gait             General Gait Details: deferred due to lack of darco shoe  Stairs            Wheelchair Mobility    Modified Rankin (Stroke Patients Only)       Balance Overall balance assessment: Needs assistance   Sitting balance-Leahy Scale: Good     Standing balance support: Bilateral upper extremity supported Standing balance-Leahy Scale: Poor                               Pertinent Vitals/Pain Pain Assessment: Faces Faces Pain Scale: Hurts even more Pain Location: left heel ulcer Pain Descriptors / Indicators: Grimacing;Guarding Pain Intervention(s): Monitored during session    Home Living Family/patient expects to be discharged to:: Private residence Living Arrangements: Alone Available Help at Discharge: Family;Available PRN/intermittently Type of Home: Apartment Home Access: Elevator     Home Layout: One level Home Equipment: Walker - 2 wheels;Shower seat;Wheelchair - manual      Prior Function Level of Independence: Independent with assistive device(s)         Comments: "furniture walks," in apartment, uses wheelchair for community mobility     Hand Dominance        Extremity/Trunk Assessment   Upper Extremity Assessment Upper Extremity Assessment: RUE deficits/detail;LUE deficits/detail RUE Deficits / Details:  Strength 4/5 LUE Deficits / Details: Strength 4/5    Lower Extremity Assessment Lower Extremity Assessment: RLE deficits/detail;LLE deficits/detail RLE Deficits / Details: Strength 4/5 (ankle DF/PF not assessed) LLE Deficits / Details: Strength 4/5 (ankle DF/PF not assessed)       Communication    Communication: No difficulties  Cognition Arousal/Alertness: Awake/alert Behavior During Therapy: WFL for tasks assessed/performed Overall Cognitive Status: Within Functional Limits for tasks assessed                                        General Comments      Exercises     Assessment/Plan    PT Assessment Patient needs continued PT services  PT Problem List Decreased strength;Decreased range of motion;Decreased activity tolerance;Decreased balance;Decreased mobility;Pain       PT Treatment Interventions DME instruction;Gait training;Functional mobility training;Therapeutic activities;Therapeutic exercise;Balance training;Patient/family education;Wheelchair mobility training    PT Goals (Current goals can be found in the Care Plan section)  Acute Rehab PT Goals Patient Stated Goal: "decrease pain." PT Goal Formulation: With patient Time For Goal Achievement: 04/14/18 Potential to Achieve Goals: Good    Frequency Min 3X/week   Barriers to discharge        Co-evaluation               AM-PAC PT "6 Clicks" Mobility  Outcome Measure Help needed turning from your back to your side while in a flat bed without using bedrails?: None Help needed moving from lying on your back to sitting on the side of a flat bed without using bedrails?: A Little Help needed moving to and from a bed to a chair (including a wheelchair)?: A Lot Help needed standing up from a chair using your arms (e.g., wheelchair or bedside chair)?: A Lot Help needed to walk in hospital room?: Total Help needed climbing 3-5 steps with a railing? : Total 6 Click Score: 13    End of Session Equipment Utilized During Treatment: Gait belt Activity Tolerance: Patient tolerated treatment well Patient left: in chair;with call bell/phone within reach Nurse Communication: Mobility status PT Visit Diagnosis: Other abnormalities of gait and mobility (R26.89);Muscle weakness (generalized)  (M62.81);Difficulty in walking, not elsewhere classified (R26.2);Pain Pain - Right/Left: Left Pain - part of body: Ankle and joints of foot    Time: 8250-0370 PT Time Calculation (min) (ACUTE ONLY): 34 min   Charges:   PT Evaluation $PT Eval Moderate Complexity: 1 Mod PT Treatments $Therapeutic Activity: 8-22 mins        Ellamae Sia, PT, DPT Acute Rehabilitation Services Pager [phone] Office [phone]   Willy Eddy 03/31/2018, 9:26 AM

## 2018-04-01 LAB — RENAL FUNCTION PANEL
ALBUMIN: 1.8 g/dL — AB (ref 3.5–5.0)
Anion gap: 17 — ABNORMAL HIGH (ref 5–15)
BUN: 54 mg/dL — ABNORMAL HIGH (ref 8–23)
CO2: 24 mmol/L (ref 22–32)
Calcium: 8.7 mg/dL — ABNORMAL LOW (ref 8.9–10.3)
Chloride: 92 mmol/L — ABNORMAL LOW (ref 98–111)
Creatinine, Ser: 8.51 mg/dL — ABNORMAL HIGH (ref 0.61–1.24)
GFR calc Af Amer: 7 mL/min — ABNORMAL LOW (ref >60)
GFR calc non Af Amer: 6 mL/min — ABNORMAL LOW (ref >60)
Glucose, Bld: 145 mg/dL — ABNORMAL HIGH (ref 70–99)
Phosphorus: 5.2 mg/dL — ABNORMAL HIGH (ref 2.5–4.6)
Potassium: 4.2 mmol/L (ref 3.5–5.1)
Sodium: 133 mmol/L — ABNORMAL LOW (ref 135–145)

## 2018-04-01 LAB — CBC
HCT: 29.2 % — ABNORMAL LOW (ref 39.0–52.0)
Hemoglobin: 9.2 g/dL — ABNORMAL LOW (ref 13.0–17.0)
MCH: 27.7 pg (ref 26.0–34.0)
MCHC: 31.5 g/dL (ref 30.0–36.0)
MCV: 88 fL (ref 80.0–100.0)
PLATELETS: 210 10*3/uL (ref 150–400)
RBC: 3.32 MIL/uL — AB (ref 4.22–5.81)
RDW: 14.6 % (ref 11.5–15.5)
WBC: 9.1 10*3/uL (ref 4.0–10.5)
nRBC: 0 % (ref 0.0–0.2)

## 2018-04-01 LAB — GLUCOSE, CAPILLARY
Glucose-Capillary: 107 mg/dL — ABNORMAL HIGH (ref 70–99)
Glucose-Capillary: 135 mg/dL — ABNORMAL HIGH (ref 70–99)
Glucose-Capillary: 137 mg/dL — ABNORMAL HIGH (ref 70–99)
Glucose-Capillary: 147 mg/dL — ABNORMAL HIGH (ref 70–99)

## 2018-04-01 LAB — PROTIME-INR
INR: 3.27
Prothrombin Time: 32.9 seconds — ABNORMAL HIGH (ref 11.4–15.2)

## 2018-04-01 MED ORDER — LIDOCAINE HCL (PF) 1 % IJ SOLN: 5.0000 mL | INTRAMUSCULAR | Status: DC | PRN

## 2018-04-01 MED ORDER — LIDOCAINE-PRILOCAINE 2.5-2.5 % EX CREA: 1.0000 "application " | TOPICAL_CREAM | CUTANEOUS | Status: DC | PRN

## 2018-04-01 MED ORDER — SODIUM CHLORIDE 0.9 % IV SOLN: 100.0000 mL | INTRAVENOUS | Status: DC | PRN

## 2018-04-01 MED ORDER — DARBEPOETIN ALFA 60 MCG/0.3ML IJ SOSY
PREFILLED_SYRINGE | INTRAMUSCULAR | Status: AC
  Administered 2018-04-01: 60 ug via INTRAVENOUS
  Filled 2018-04-01: qty 0.3

## 2018-04-01 MED ORDER — HYDROCODONE-ACETAMINOPHEN 5-325 MG PO TABS
ORAL_TABLET | ORAL | Status: AC
  Administered 2018-04-01: 2 via ORAL
  Filled 2018-04-01: qty 2

## 2018-04-01 MED ORDER — SODIUM CHLORIDE 0.9 % IV BOLUS
500.0000 mL | Freq: Once | INTRAVENOUS | Status: AC
  Administered 2018-04-01: 500 mL via INTRAVENOUS

## 2018-04-01 MED ORDER — PENTAFLUOROPROP-TETRAFLUOROETH EX AERO: 1.0000 "application " | INHALATION_SPRAY | CUTANEOUS | Status: DC | PRN

## 2018-04-01 MED ORDER — HEPARIN SODIUM (PORCINE) 1000 UNIT/ML DIALYSIS: 1000.0000 [IU] | INTRAMUSCULAR | Status: DC | PRN

## 2018-04-01 MED ORDER — DOXERCALCIFEROL 4 MCG/2ML IV SOLN
INTRAVENOUS | Status: AC
  Administered 2018-04-01: 3.5 ug via INTRAVENOUS
  Filled 2018-04-01: qty 2

## 2018-04-01 NOTE — Progress Notes (Signed)
[facility]    NEPHROLOGY PROGRESS NOTE  SUBJECTIVE: Currently feels well.  Denies chest pain, shortness of breath, nausea, vomiting, diarrhea or dysuria.  All other review of systems are negative.  Dialysis today.     OBJECTIVE:  Vitals:   04/01/18 0519 04/01/18 0827  BP:  108/60  Pulse:  (!) 103  Resp: 15 13  Temp:  97.7 F (36.5 C)  SpO2:  100%   No intake or output data in the 24 hours ending 04/01/18 1409    Genearl:  AAOx3 NAD HEENT: MMM  AT anicteric sclera Neck:  No JVD, no adenopathy CV:  Heart RRR  Lungs:  L/S CTA bilaterally Abd:  abd SNT/ND with normal BS GU:  Bladder non-palpable Extremities:  No LE edema.  Left upper extremity AV fistula with a good thrill and bruit. Skin:  No skin rash  MEDICATIONS:  . allopurinol  100 mg Oral Daily  . aspirin EC  81 mg Oral Daily  . Chlorhexidine Gluconate Cloth  6 each Topical Q0600  . cinacalcet  30 mg Oral Q supper  . darbepoetin (ARANESP) injection - DIALYSIS  60 mcg Intravenous Q Sat-HD  . diltiazem  180 mg Oral Daily  . doxercalciferol  3.5 mcg Intravenous Q T,Th,Sa-HD  . insulin aspart  0-9 Units Subcutaneous TID WC  . metoprolol tartrate  12.5 mg Oral BID  . pantoprazole  40 mg Oral Daily  . pravastatin  5 mg Oral QPM  . senna-docusate  1 tablet Oral BID  . sevelamer carbonate  1,600 mg Oral With snacks  . sevelamer carbonate  3,200 mg Oral TID WC  . sodium chloride flush  3 mL Intravenous Q12H  . Warfarin - Pharmacist Dosing Inpatient   Does not apply q1800    LABS:   CBC Latest Ref Rng & Units 04/01/2018 03/31/2018 03/30/2018  WBC 4.0 - 10.5 K/uL 9.1 10.7(H) 9.8  Hemoglobin 13.0 - 17.0 g/dL 9.2(L) 10.1(L) 10.5(L)  Hematocrit 39.0 - 52.0 % 29.2(L) 32.5(L) 33.3(L)  Platelets 150 - 400 K/uL 210 219 217    CMP Latest Ref Rng & Units 04/01/2018 03/31/2018 03/30/2018  Glucose 70 - 99 mg/dL 145(H) 148(H) 163(H)  BUN 8 - 23 mg/dL 54(H) 28(H) 48(H)  Creatinine 0.61 - 1.24 mg/dL 8.51(H) 6.10(H)  8.45(H)  Sodium 135 - 145 mmol/L 133(L) 136 135  Potassium 3.5 - 5.1 mmol/L 4.2 4.5 4.6  Chloride 98 - 111 mmol/L 92(L) 89(L) 89(L)  CO2 22 - 32 mmol/L 24 27 25   Calcium 8.9 - 10.3 mg/dL 8.7(L) 8.9 8.7(L)  Total Protein 6.5 - 8.1 g/dL - - -  Total Bilirubin 0.3 - 1.2 mg/dL - - -  Alkaline Phos 38 - 126 U/L - - -  AST 15 - 41 U/L - - -  ALT 17 - 63 U/L - - -    Lab Results  Component Value Date   PTH 350.0 (H) 03/21/2007   CALCIUM 8.7 (L) 04/01/2018   CAION 0.95 (L) 01/18/2017   PHOS 5.2 (H) 04/01/2018       Component Value Date/Time   COLORURINE YELLOW 10/12/2013 1045   APPEARANCEUR CLEAR 10/12/2013 1045   LABSPEC 1.015 10/12/2013 1045   PHURINE 8.0 10/12/2013 1045   GLUCOSEU NEGATIVE 10/12/2013 1045   HGBUR MODERATE (A) 10/12/2013 1045   BILIRUBINUR NEGATIVE 10/12/2013 1045   KETONESUR NEGATIVE 10/12/2013 1045   PROTEINUR 30 (A) 10/12/2013 1045   UROBILINOGEN 1.0 10/12/2013 1045   NITRITE NEGATIVE 10/12/2013 1045  LEUKOCYTESUR NEGATIVE 10/12/2013 1045      Component Value Date/Time   PHART 7.405 10/12/2013 0352   PCO2ART 32.2 (L) 10/12/2013 0352   PO2ART 111.0 (H) 10/12/2013 0352   HCO3 20.4 10/12/2013 0352   TCO2 28 01/18/2017 1346   ACIDBASEDEF 3.9 (H) 10/12/2013 0352   O2SAT 99.1 10/12/2013 0352       Component Value Date/Time   IRON 46 03/16/2007 0610   TIBC 358 03/16/2007 0610   FERRITIN 237 03/16/2007 0610   IRONPCTSAT 13 (L) 03/16/2007 0610       ASSESSMENT/PLAN:     1. Bilateral arterial insufficiency- with cellulitis and gangrenous changes.  On abx, S/p angio, s/p TMTs on both sides  2/5. 2. ESRD continue with HD qTTS, on HD- doing well, next HD planned for Tuesday 3. Anemia: cont with ESA.  Hemoglobin is at goal. 4. CKD-MBD: continue with binders and vit D 5. Nutrition: renal diet 6. Hypertension: stable  7. DM- stable 8. Afib: on cardizem, restarting Coumadin per pharmacy    [facility], DO, [place]

## 2018-04-01 NOTE — Progress Notes (Signed)
PROGRESS NOTE  Albert [name] UJW:[id number] DOB: 07/03/1955 DOA: 03/20/2018 PCP: Redmond School, MD  HPI/Recap of past 24 hours:  Post op day three, Pain is well controlled, no fever, Awaiting for inpatient rehab placement He is in afib, heart rate around low 110's at rest, bp stable  Assessment/Plan: Principal Problem:   Cellulitis of left lower extremity Active Problems:   Essential hypertension, benign   Atrial fibrillation (HCC)   ESRD (end stage renal disease) (HCC)   Supratherapeutic INR   Necrosis of toe (HCC)   PVD (peripheral vascular disease) ([place])  Cellulitis of left lower extremity/bilateral dry gangrenous toes/ severe bilateral peripheral vascular disease: - Transfer from [facility] for arteriogram. - Bilateral angiogram done on 03/28/2018 with balloon angioplasty of the left posterior tibial artery - he received IV Ancef since 1/27, he has remained afebrile no leukocytosis. - s/p right transmetatarsal amputation and left second toe amputation on 2/5 by vascular surgery Dr Oneida Alar -coumadin resumed on 2/6 per vascular surgery, he is on asa and statin -he is started on ancef since admission, currently he is still getting it daily,  abx duration per vascular surgery.  -will follow vascular surgery recommendation  End renal disease on dialysis: Not making urine anymore HD TTS, plan per nephrology, appreciate nephrology input  Anemia of chronic disease hgb stable at baseline on ESA during HD, plan per nephrology  Chronic atrial fibrillation: Continue Coreg and diltiazem,  Heart rate controlled. [place] vasc score 2. Home meds coumadin held, he presented with supertherapeutic INR (INR 4.5 on 1/28),   vascular surgery cleared patient to resume anticoagulation  Continue cardizem, d/c coreg, change coreg to lopressor for rate control and less effect on bp, titrate up lopressor if bp allows    Diabetes mellitus type II: noninsulin dependent Home  meds glipizide and Tradjenta held in the hospital Good control on sliding scale insulin continue current management  Code Status: full  Family Communication: patient   Disposition Plan: CIR placement , likely on monday    Consultants:  Nephrology  Vascular surgery  CIR  Procedures:  HD MWF  Left 2nd toe amputation and right transmetatarsal amputation by vascular surgery on 2/5  Antibiotics:  ancef   Objective: BP 108/71 (BP Location: Right Arm)   Pulse (!) 101   Temp 97.9 F (36.6 C) (Oral)   Resp 15   Ht 6\' 2"  (1.88 m)   Wt 95.7 kg   SpO2 100%   BMI 27.09 kg/m  No intake or output data in the 24 hours ending 04/01/18 0746 Filed Weights   03/30/18 1206 03/31/18 0533 04/01/18 0519  Weight: 94.7 kg 96.4 kg 95.7 kg    Exam: Patient is examined daily including today on 04/01/2018, exams remain the same as of yesterday except that has changed    General:  NAD  Cardiovascular: IRRR  Respiratory: CTABL  Abdomen: Soft/ND/NT, positive BS  Musculoskeletal: post op changes, dressing in place  Neuro: alert, oriented   Data Reviewed: Basic Metabolic Panel: Recent Labs  Lab 03/25/18 0805 03/28/18 0347 03/29/18 0350 03/30/18 0314 03/31/18 0336  NA 133* 133* 138 135 136  K 4.1 4.6 4.0 4.6 4.5  CL 93* 91* 96* 89* 89*  CO2 23 25 29 25 27   GLUCOSE 149* 157* 153* 163* 148*  BUN 63* 75* 34* 48* 28*  CREATININE 9.08* 10.92* 6.61* 8.45* 6.10*  CALCIUM 8.2* 8.4* 8.6* 8.7* 8.9  PHOS 5.8*  --   --   --   --  Liver Function Tests: Recent Labs  Lab 03/25/18 0805  ALBUMIN 2.1*   No results for input(s): LIPASE, AMYLASE in the last 168 hours. No results for input(s): AMMONIA in the last 168 hours. CBC: Recent Labs  Lab 03/25/18 0805 03/28/18 0347 03/29/18 0350 03/30/18 0314 03/31/18 0336  WBC 6.6 7.7 7.9 9.8 10.7*  HGB 10.0* 9.9* 10.3* 10.5* 10.1*  HCT 32.2* 31.0* 31.8* 33.3* 32.5*  MCV 89.0 87.3 88.3 89.5 89.5  PLT 188 197 219 217 219    Cardiac Enzymes:   No results for input(s): CKTOTAL, CKMB, CKMBINDEX, TROPONINI in the last 168 hours. BNP (last 3 results) No results for input(s): BNP in the last 8760 hours.  ProBNP (last 3 results) No results for input(s): PROBNP in the last 8760 hours.  CBG: Recent Labs  Lab 03/31/18 0558 03/31/18 1102 03/31/18 1639 03/31/18 2130 04/01/18 0606  GLUCAP 120* 144* 140* 137* 107*    Recent Results (from the past 240 hour(s))  Surgical pcr screen     Status: None   Collection Time: 03/27/18  5:23 AM  Result Value Ref Range Status   MRSA, PCR NEGATIVE NEGATIVE Final   Staphylococcus aureus NEGATIVE NEGATIVE Final    Comment: (NOTE) The Xpert SA Assay (FDA approved for NASAL specimens in patients 28 years of age and older), is one component of a comprehensive surveillance program. It is not intended to diagnose infection nor to guide or monitor treatment. Performed at [facility], [place] [address], [place], [place] [address]      Studies: No results found.  Scheduled Meds: . allopurinol  100 mg Oral Daily  . aspirin EC  81 mg Oral Daily  . Chlorhexidine Gluconate Cloth  6 each Topical Q0600  . cinacalcet  30 mg Oral Q supper  . darbepoetin (ARANESP) injection - DIALYSIS  60 mcg Intravenous Q Sat-HD  . diltiazem  180 mg Oral Daily  . doxercalciferol  3.5 mcg Intravenous Q T,Th,Sa-HD  . insulin aspart  0-9 Units Subcutaneous TID WC  . metoprolol tartrate  12.5 mg Oral BID  . pantoprazole  40 mg Oral Daily  . pravastatin  5 mg Oral QPM  . senna-docusate  1 tablet Oral BID  . sevelamer carbonate  1,600 mg Oral With snacks  . sevelamer carbonate  3,200 mg Oral TID WC  . sodium chloride flush  3 mL Intravenous Q12H  . Warfarin - Pharmacist Dosing Inpatient   Does not apply q1800    Continuous Infusions: . sodium chloride Stopped (03/29/18 1733)  . sodium chloride    .  ceFAZolin (ANCEF) IV 1 g (03/31/18 2209)     Time spent: 13mins I have personally  reviewed and interpreted on  04/01/2018 daily labs, tele strips, imagings as discussed above under date review session and assessment and plans.  I reviewed all nursing notes, pharmacy notes, consultant notes,  vitals, pertinent old records  I have discussed plan of care as described above with RN , patient  on 04/01/2018   Florencia Reasons MD, PhD  [facility] Pager [phone]. If 7PM-7AM, please contact night-coverage at [email or web address], password [facility] 04/01/2018, 7:46 AM  LOS: 12 days

## 2018-04-01 NOTE — Progress Notes (Signed)
ANTICOAGULATION CONSULT NOTE  Pharmacy Consult:  Coumadin  Indication: atrial fibrillation   Allergies  Allergen Reactions  . Metformin And Related Other (See Comments)    Diarrhea, bloody stool, upset stomach.     Patient Measurements: Height: 6\' 2"  (188 cm) Weight: 210 lb 15.7 oz (95.7 kg) IBW/kg (Calculated) : 82.2  Vital Signs: Temp: 97.7 F (36.5 C) (02/08 0827) Temp Source: Oral (02/08 0827) BP: 108/60 (02/08 0827) Pulse Rate: 103 (02/08 0827)  Labs: Recent Labs    03/30/18 0314 03/31/18 0336 04/01/18 0414  HGB 10.5* 10.1*  --   HCT 33.3* 32.5*  --   PLT 217 219  --   LABPROT  --  25.8* 32.9*  INR  --  2.40 3.27  CREATININE 8.45* 6.10*  --     Estimated Creatinine Clearance: 14.2 mL/min (A) (by C-G formula based on SCr of 6.1 mg/dL (H)).   Assessment: 31 YOM with history of Afib on Coumadin PTA.  INR elevated on admit and was reversed with Vitamin K 1mg  on 03/25/18.  Patient presented with left heel pain/gangrenous toes and underwent amputation on 03/29/18.  Pharmacy consulted to resume Coumadin on 03/30/18.   -INR= 3.27 (trend up; possibly related to antibiotics and/or dietary status)  Home Coumadin regimen:  2.5mg  PO daily   Goal of Therapy:  INR 2-3 Monitor platelets by anticoagulation protocol: Yes  Resolution of infection    Plan:  Hold coumadin today Daily PT / INR  Hildred Laser, PharmD Clinical Pharmacist **Pharmacist phone directory can now be found on [email or web address] ([email or web address]).  Listed under [place].

## 2018-04-02 LAB — GLUCOSE, CAPILLARY
GLUCOSE-CAPILLARY: 130 mg/dL — AB (ref 70–99)
Glucose-Capillary: 129 mg/dL — ABNORMAL HIGH (ref 70–99)
Glucose-Capillary: 136 mg/dL — ABNORMAL HIGH (ref 70–99)
Glucose-Capillary: 156 mg/dL — ABNORMAL HIGH (ref 70–99)

## 2018-04-02 LAB — PROTIME-INR
INR: 3.58
Prothrombin Time: 35.2 seconds — ABNORMAL HIGH (ref 11.4–15.2)

## 2018-04-02 NOTE — Progress Notes (Signed)
PROGRESS NOTE  Albert [name] VFI:[id number] DOB: February 27, 1955 DOA: 03/20/2018 PCP: Redmond School, MD  HPI/Recap of past 24 hours:  Post op day four, Pain is well controlled, no fever, Awaiting for inpatient rehab placement  He is in afib, had afib/rvr with mild hypotension after returning back from dialysis yesterday evening, he received ccb/bb and 500cc fluids bolus He reports he always feel tired after dialysis  Assessment/Plan: Principal Problem:   Cellulitis of left lower extremity Active Problems:   Essential hypertension, benign   Atrial fibrillation (HCC)   ESRD (end stage renal disease) (HCC)   Supratherapeutic INR   Necrosis of toe (HCC)   PVD (peripheral vascular disease) ([place])  Cellulitis of left lower extremity/bilateral dry gangrenous toes/ severe bilateral peripheral vascular disease: - Transfer from [facility] for arteriogram. - Bilateral angiogram done on 03/28/2018 with balloon angioplasty of the left posterior tibial artery - s/p right transmetatarsal amputation and left second toe amputation on 2/5 by vascular surgery Dr Oneida Alar -coumadin resumed on 2/6 per vascular surgery, he is on asa and statin,  He is treated with IV Ancef since 1/27, he has remained afebrile no leukocytosis. He received total of 14days of ancef, last dose on 2/9 -will follow vascular surgery recommendation  End renal disease on dialysis: Not making urine anymore HD TTS, plan per nephrology, appreciate nephrology input  Anemia of chronic disease hgb stable at baseline on ESA during HD, plan per nephrology  Chronic atrial fibrillation: Continue Coreg and diltiazem,  Heart rate controlled. [place] vasc score 2. Home meds coumadin held, he presented with supertherapeutic INR (INR 4.5 on 1/28),   vascular surgery cleared patient to resume anticoagulation  Continue cardizem, d/c coreg, change coreg to lopressor for rate control and less effect on bp, titrate up lopressor if  bp allows    Diabetes mellitus type II: noninsulin dependent Home meds glipizide and Tradjenta held in the hospital Good control on sliding scale insulin continue current management  Code Status: full  Family Communication: patient   Disposition Plan: CIR placement , likely on monday    Consultants:  Nephrology  Vascular surgery  CIR  Procedures:  HD MWF  Left 2nd toe amputation and right transmetatarsal amputation by vascular surgery on 2/5  Antibiotics:  ancef   Objective: BP 139/74   Pulse (!) 106   Temp 98.4 F (36.9 C) (Oral)   Resp 20   Ht 6\' 2"  (1.88 m)   Wt 94 kg   SpO2 99%   BMI 26.61 kg/m   Intake/Output Summary (Last 24 hours) at 04/02/2018 0917 Last data filed at 04/01/2018 2320 Gross per 24 hour  Intake 60 ml  Output 2516 ml  Net -2456 ml   Filed Weights   04/01/18 1308 04/01/18 1721 04/02/18 0550  Weight: 98 kg 95.5 kg 94 kg    Exam: Patient is examined daily including today on 04/02/2018, exams remain the same as of yesterday except that has changed    General:  NAD  Cardiovascular: IRRR  Respiratory: CTABL  Abdomen: Soft/ND/NT, positive BS  Musculoskeletal: post op changes  Neuro: alert, oriented   Data Reviewed: Basic Metabolic Panel: Recent Labs  Lab 03/28/18 0347 03/29/18 0350 03/30/18 0314 03/31/18 0336 04/01/18 1302  NA 133* 138 135 136 133*  K 4.6 4.0 4.6 4.5 4.2  CL 91* 96* 89* 89* 92*  CO2 25 29 25 27 24   GLUCOSE 157* 153* 163* 148* 145*  BUN 75* 34* 48* 28* 54*  CREATININE  10.92* 6.61* 8.45* 6.10* 8.51*  CALCIUM 8.4* 8.6* 8.7* 8.9 8.7*  PHOS  --   --   --   --  5.2*   Liver Function Tests: Recent Labs  Lab 04/01/18 1302  ALBUMIN 1.8*   No results for input(s): LIPASE, AMYLASE in the last 168 hours. No results for input(s): AMMONIA in the last 168 hours. CBC: Recent Labs  Lab 03/28/18 0347 03/29/18 0350 03/30/18 0314 03/31/18 0336 04/01/18 1324  WBC 7.7 7.9 9.8 10.7* 9.1  HGB 9.9* 10.3*  10.5* 10.1* 9.2*  HCT 31.0* 31.8* 33.3* 32.5* 29.2*  MCV 87.3 88.3 89.5 89.5 88.0  PLT 197 219 217 219 210   Cardiac Enzymes:   No results for input(s): CKTOTAL, CKMB, CKMBINDEX, TROPONINI in the last 168 hours. BNP (last 3 results) No results for input(s): BNP in the last 8760 hours.  ProBNP (last 3 results) No results for input(s): PROBNP in the last 8760 hours.  CBG: Recent Labs  Lab 04/01/18 0606 04/01/18 1116 04/01/18 1807 04/01/18 2122 04/02/18 0643  GLUCAP 107* 135* 137* 147* 129*    Recent Results (from the past 240 hour(s))  Surgical pcr screen     Status: None   Collection Time: 03/27/18  5:23 AM  Result Value Ref Range Status   MRSA, PCR NEGATIVE NEGATIVE Final   Staphylococcus aureus NEGATIVE NEGATIVE Final    Comment: (NOTE) The Xpert SA Assay (FDA approved for NASAL specimens in patients 62 years of age and older), is one component of a comprehensive surveillance program. It is not intended to diagnose infection nor to guide or monitor treatment. Performed at [facility], [place] [address], [place], [place] [address]      Studies: No results found.  Scheduled Meds: . allopurinol  100 mg Oral Daily  . aspirin EC  81 mg Oral Daily  . Chlorhexidine Gluconate Cloth  6 each Topical Q0600  . cinacalcet  30 mg Oral Q supper  . darbepoetin (ARANESP) injection - DIALYSIS  60 mcg Intravenous Q Sat-HD  . diltiazem  180 mg Oral Daily  . doxercalciferol  3.5 mcg Intravenous Q T,Th,Sa-HD  . insulin aspart  0-9 Units Subcutaneous TID WC  . metoprolol tartrate  12.5 mg Oral BID  . pantoprazole  40 mg Oral Daily  . pravastatin  5 mg Oral QPM  . senna-docusate  1 tablet Oral BID  . sevelamer carbonate  1,600 mg Oral With snacks  . sevelamer carbonate  3,200 mg Oral TID WC  . sodium chloride flush  3 mL Intravenous Q12H  . Warfarin - Pharmacist Dosing Inpatient   Does not apply q1800    Continuous Infusions: . sodium chloride       Time spent:  68mins I have personally reviewed and interpreted on  04/02/2018 daily labs, tele strips, imagings as discussed above under date review session and assessment and plans.  I reviewed all nursing notes, pharmacy notes, consultant notes,  vitals, pertinent old records  I have discussed plan of care as described above with RN , patient  on 04/02/2018   Florencia Reasons MD, PhD  [facility] Pager [phone]. If 7PM-7AM, please contact night-coverage at [email or web address], password [facility] 04/02/2018, 9:17 AM  LOS: 13 days

## 2018-04-02 NOTE — Progress Notes (Signed)
ANTICOAGULATION CONSULT NOTE  Pharmacy Consult:  Coumadin  Indication: atrial fibrillation   Allergies  Allergen Reactions  . Metformin And Related Other (See Comments)    Diarrhea, bloody stool, upset stomach.     Patient Measurements: Height: 6\' 2"  (188 cm) Weight: 207 lb 3.7 oz (94 kg) IBW/kg (Calculated) : 82.2  Vital Signs: Temp: 98.4 F (36.9 C) (02/09 0400) Temp Source: Oral (02/09 0400) BP: 139/74 (02/09 0800) Pulse Rate: 106 (02/09 0910)  Labs: Recent Labs    03/31/18 0336 04/01/18 0414 04/01/18 1302 04/01/18 1324 04/02/18 0342  HGB 10.1*  --   --  9.2*  --   HCT 32.5*  --   --  29.2*  --   PLT 219  --   --  210  --   LABPROT 25.8* 32.9*  --   --  35.2*  INR 2.40 3.27  --   --  3.58  CREATININE 6.10*  --  8.51*  --   --     Estimated Creatinine Clearance: 10.2 mL/min (A) (by C-G formula based on SCr of 8.51 mg/dL (H)).   Assessment: 31 YOM with history of Afib on Coumadin PTA.  INR elevated on admit and was reversed with Vitamin K 1mg  on 03/25/18.  Patient presented with left heel pain/gangrenous toes and underwent amputation on 03/29/18.  Pharmacy consulted to resume Coumadin on 03/30/18.   -INR= 3.58 (trend up; possibly related to recent antibiotics and/or dietary status)  Home Coumadin regimen:  2.5mg  PO daily   Goal of Therapy:  INR 2-3 Monitor platelets by anticoagulation protocol: Yes  Resolution of infection    Plan:  Hold coumadin today Daily PT / INR  Hildred Laser, PharmD Clinical Pharmacist **Pharmacist phone directory can now be found on [email or web address] ([email or web address]).  Listed under [place].

## 2018-04-02 NOTE — Progress Notes (Signed)
[facility]    NEPHROLOGY PROGRESS NOTE  SUBJECTIVE: Currently feels well.  Denies chest pain, shortness of breath, nausea, vomiting, diarrhea or dysuria.  All other review of systems are negative.  Dialysis on Tuesday    OBJECTIVE:  Vitals:   04/02/18 1145 04/02/18 1342  BP: (!) 119/58 106/67  Pulse:  (!) 103  Resp: 16 18  Temp: 98.5 F (36.9 C) 98.3 F (36.8 C)  SpO2:  100%    Intake/Output Summary (Last 24 hours) at 04/02/2018 1633 Last data filed at 04/02/2018 0800 Gross per 24 hour  Intake 180 ml  Output 2516 ml  Net -2336 ml      Genearl:  AAOx3 NAD HEENT: MMM [place] AT anicteric sclera Neck:  No JVD, no adenopathy CV:  Heart RRR  Lungs:  L/S CTA bilaterally Abd:  abd SNT/ND with normal BS GU:  Bladder non-palpable Extremities:  No LE edema.  Left upper extremity AV fistula with a good thrill and bruit. Skin:  No skin rash  MEDICATIONS:  . allopurinol  100 mg Oral Daily  . aspirin EC  81 mg Oral Daily  . Chlorhexidine Gluconate Cloth  6 each Topical Q0600  . cinacalcet  30 mg Oral Q supper  . darbepoetin (ARANESP) injection - DIALYSIS  60 mcg Intravenous Q Sat-HD  . diltiazem  180 mg Oral Daily  . doxercalciferol  3.5 mcg Intravenous Q T,Th,Sa-HD  . insulin aspart  0-9 Units Subcutaneous TID WC  . metoprolol tartrate  12.5 mg Oral BID  . pantoprazole  40 mg Oral Daily  . pravastatin  5 mg Oral QPM  . senna-docusate  1 tablet Oral BID  . sevelamer carbonate  1,600 mg Oral With snacks  . sevelamer carbonate  3,200 mg Oral TID WC  . sodium chloride flush  3 mL Intravenous Q12H  . Warfarin - Pharmacist Dosing Inpatient   Does not apply q1800    LABS:   CBC Latest Ref Rng & Units 04/01/2018 03/31/2018 03/30/2018  WBC 4.0 - 10.5 K/uL 9.1 10.7(H) 9.8  Hemoglobin 13.0 - 17.0 g/dL 9.2(L) 10.1(L) 10.5(L)  Hematocrit 39.0 - 52.0 % 29.2(L) 32.5(L) 33.3(L)  Platelets 150 - 400 K/uL 210 219 217    CMP Latest Ref Rng & Units 04/01/2018 03/31/2018 03/30/2018   Glucose 70 - 99 mg/dL 145(H) 148(H) 163(H)  BUN 8 - 23 mg/dL 54(H) 28(H) 48(H)  Creatinine 0.61 - 1.24 mg/dL 8.51(H) 6.10(H) 8.45(H)  Sodium 135 - 145 mmol/L 133(L) 136 135  Potassium 3.5 - 5.1 mmol/L 4.2 4.5 4.6  Chloride 98 - 111 mmol/L 92(L) 89(L) 89(L)  CO2 22 - 32 mmol/L 24 27 25   Calcium 8.9 - 10.3 mg/dL 8.7(L) 8.9 8.7(L)  Total Protein 6.5 - 8.1 g/dL - - -  Total Bilirubin 0.3 - 1.2 mg/dL - - -  Alkaline Phos 38 - 126 U/L - - -  AST 15 - 41 U/L - - -  ALT 17 - 63 U/L - - -    Lab Results  Component Value Date   PTH 350.0 (H) 03/21/2007   CALCIUM 8.7 (L) 04/01/2018   CAION 0.95 (L) 01/18/2017   PHOS 5.2 (H) 04/01/2018       Component Value Date/Time   COLORURINE YELLOW 10/12/2013 [place] 10/12/2013 1045   LABSPEC 1.015 10/12/2013 1045   PHURINE 8.0 10/12/2013 1045   GLUCOSEU NEGATIVE 10/12/2013 1045   HGBUR MODERATE (A) 10/12/2013 [employer] 10/12/2013 1045  KETONESUR NEGATIVE 10/12/2013 1045   PROTEINUR 30 (A) 10/12/2013 1045   UROBILINOGEN 1.0 10/12/2013 1045   NITRITE NEGATIVE 10/12/2013 1045   LEUKOCYTESUR NEGATIVE 10/12/2013 1045      Component Value Date/Time   PHART 7.405 10/12/2013 0352   PCO2ART 32.2 (L) 10/12/2013 0352   PO2ART 111.0 (H) 10/12/2013 0352   HCO3 20.4 10/12/2013 0352   TCO2 28 01/18/2017 1346   ACIDBASEDEF 3.9 (H) 10/12/2013 0352   O2SAT 99.1 10/12/2013 0352       Component Value Date/Time   IRON 46 03/16/2007 0610   TIBC 358 03/16/2007 0610   FERRITIN 237 03/16/2007 0610   IRONPCTSAT 13 (L) 03/16/2007 0610       ASSESSMENT/PLAN:     1. Bilateral arterial insufficiency- with cellulitis and gangrenous changes.  On abx, S/p angio, s/p TMTs on both sides  2/5. 2. ESRD continue with HD qTTS, on HD- doing well, next HD planned for Tuesday 3. Anemia: cont with ESA.  Hemoglobin is at goal. 4. CKD-MBD: continue with binders and vit D 5. Nutrition: renal diet 6. Hypertension: stable  7. DM-  stable 8. Afib: on cardizem, restarting Coumadin per pharmacy    [facility], DO, [place]

## 2018-04-03 ENCOUNTER — Telehealth: Payer: Self-pay | Admitting: Vascular Surgery

## 2018-04-03 DIAGNOSIS — N186 End stage renal disease: Secondary | ICD-10-CM

## 2018-04-03 DIAGNOSIS — I4819 Other persistent atrial fibrillation: Secondary | ICD-10-CM

## 2018-04-03 DIAGNOSIS — R791 Abnormal coagulation profile: Secondary | ICD-10-CM

## 2018-04-03 DIAGNOSIS — Z992 Dependence on renal dialysis: Secondary | ICD-10-CM

## 2018-04-03 DIAGNOSIS — I10 Essential (primary) hypertension: Secondary | ICD-10-CM

## 2018-04-03 LAB — GLUCOSE, CAPILLARY
Glucose-Capillary: 133 mg/dL — ABNORMAL HIGH (ref 70–99)
Glucose-Capillary: 163 mg/dL — ABNORMAL HIGH (ref 70–99)
Glucose-Capillary: 96 mg/dL (ref 70–99)
Glucose-Capillary: 136 mg/dL — ABNORMAL HIGH (ref 70–99)

## 2018-04-03 LAB — PROTIME-INR
INR: 3.54
Prothrombin Time: 34.9 seconds — ABNORMAL HIGH (ref 11.4–15.2)

## 2018-04-03 MED ORDER — SODIUM CHLORIDE 0.9 % IV BOLUS
500.0000 mL | Freq: Once | INTRAVENOUS | Status: AC
  Administered 2018-04-03: 500 mL via INTRAVENOUS

## 2018-04-03 NOTE — Progress Notes (Signed)
04/03/18 1200  PT Visit Information  Last PT Received On 04/03/18  Assistance Needed +2  History of Present Illness Pt is a 63 y.o. M with significant PMH of ESRD, PVD, who presents s/p right transmetarsal amputation and left amputation of 2nd toe. Of note, pt also has heel ulcer that is superficial over the Achilles.  Subjective Data  Patient Stated Goal decrease pain  Precautions  Precautions Fall  Required Braces or Orthoses Other Brace  Other Brace right darco, left post op shoe  Restrictions  Weight Bearing Restrictions Yes  RLE Weight Bearing PWB  RLE Partial Weight Bearing Percentage or Pounds heel weightbearing with Darco shoe  LLE Weight Bearing WBAT  Pain Assessment  Pain Assessment 0-10  Pain Score 7  Pain Location L/R feet  Pain Descriptors / Indicators Discomfort;Grimacing;Guarding;Operative site guarding  Pain Intervention(s) Limited activity within patient's tolerance;Monitored during session;Repositioned  Cognition  Arousal/Alertness Awake/alert  Behavior During Therapy WFL for tasks assessed/performed  Overall Cognitive Status Within Functional Limits for tasks assessed  Bed Mobility  General bed mobility comments pt in recliner upon arrival  Transfers  Overall transfer level Needs assistance  Equipment used Rolling walker (2 wheeled)  Transfers Sit to/from Stand  Sit to Stand Mod assist;Max assist;+2 physical assistance  General transfer comment Patient required heavy mod-maxA +2 to stand from chair with use of RW. Assistance needed for lift assist. Verbal cues for hand placement and push through BUE. Cues for upright posture. MaxA requried for controlled descent and steadying of RW. Completed 2 trials of standing with rest break in between due to increase in HR. Decreased weight bearing on LLE in standing due to heel ulcer and pain  Balance  Overall balance assessment Needs assistance  Sitting-balance support Feet supported  Sitting balance-Leahy Scale Good   Standing balance support Bilateral upper extremity supported  Standing balance-Leahy Scale Poor  Standing balance comment reliant on BUE support to maintain standing balance  General Comments  General comments (skin integrity, edema, etc.) Reviewed weight bearing precautions with patient.   Exercises  Exercises General Lower Extremity  General Exercises - Lower Extremity  Ankle Circles/Pumps AROM;Both;20 reps;Seated  Long Arc Quad AROM;Both;10 reps;Seated  PT - End of Session  Equipment Utilized During Treatment Gait belt  Activity Tolerance Patient tolerated treatment well  Patient left in chair;with call bell/phone within reach  Nurse Communication Mobility status   PT - Assessment/Plan  PT Plan Current plan remains appropriate  PT Visit Diagnosis Other abnormalities of gait and mobility (R26.89);Muscle weakness (generalized) (M62.81);Difficulty in walking, not elsewhere classified (R26.2);Pain  Pain - Right/Left Left  Pain - part of body Ankle and joints of foot  PT Frequency (ACUTE ONLY) Min 3X/week  Follow Up Recommendations CIR  PT equipment None recommended by PT  AM-PAC PT "6 Clicks" Mobility Outcome Measure (Version 2)  Help needed turning from your back to your side while in a flat bed without using bedrails? 4  Help needed moving from lying on your back to sitting on the side of a flat bed without using bedrails? 3  Help needed moving to and from a bed to a chair (including a wheelchair)? 2  Help needed standing up from a chair using your arms (e.g., wheelchair or bedside chair)? 2  Help needed to walk in hospital room? 1  Help needed climbing 3-5 steps with a railing?  1  6 Click Score 13  Consider Recommendation of Discharge To: CIR/SNF/LTACH  PT Goal Progression  Progress towards PT  goals Progressing toward goals  Acute Rehab PT Goals  PT Goal Formulation With patient  Time For Goal Achievement 04/14/18  Potential to Achieve Goals Good  PT Time Calculation  PT  Start Time (ACUTE ONLY) 1144  PT Stop Time (ACUTE ONLY) 1203  PT Time Calculation (min) (ACUTE ONLY) 19 min  PT General Charges  $$ ACUTE PT VISIT 1 Visit  PT Treatments  $Therapeutic Activity 8-22 mins   Patient progressing towards goals. Patient required heavy mod-maxA +2 to stand with use of RW. Completed 2 standing trials to work on upright standing tolerance. HR ranging from 119-high 130s during standing trials. Patient with limited weight bearing on LLE secondary to heel ulcer and pain. Educated and reviewed seated HEP and weight bearing precautions with patient. Patient remains motivated and eager to work with therapy to regain baseline mobility. Current recommendation remain appropriate. Patient will benefit from acute physical therapy services to maximize independence and safety with functional mobility.  Erick Blinks, SPT

## 2018-04-03 NOTE — Telephone Encounter (Signed)
-----   Message from Waynetta Sandy, MD sent at 04/03/2018 12:10 PM EST ----- Albert [name] [id number] Mar 08, 1955  Follow-up in 2 to 3 weeks for wound check with me/NP/PA  brandon

## 2018-04-03 NOTE — Progress Notes (Signed)
Inpatient Rehabilitation-Admissions Coordinator   Pt's insurance company has not yet made a final determination regarding CIR. AC will follow up tomorrow.   Jhonnie Garner, OTR/L  Rehab Admissions Coordinator  [phone] 04/03/2018 5:11 PM

## 2018-04-03 NOTE — Telephone Encounter (Signed)
sch appt lvm mld ltr 04/28/2018 345pm wound check NP

## 2018-04-03 NOTE — Progress Notes (Signed)
ANTICOAGULATION CONSULT NOTE  Pharmacy Consult:  Coumadin  Indication: atrial fibrillation   Allergies  Allergen Reactions  . Metformin And Related Other (See Comments)    Diarrhea, bloody stool, upset stomach.     Patient Measurements: Height: 6\' 2"  (188 cm) Weight: 210 lb 8.6 oz (95.5 kg) IBW/kg (Calculated) : 82.2  Vital Signs: Temp: 98.2 F (36.8 C) (02/10 0400) Temp Source: Oral (02/10 0400) BP: 104/64 (02/10 0849) Pulse Rate: 108 (02/10 0849)  Labs: Recent Labs    04/01/18 0414 04/01/18 1302 04/01/18 1324 04/02/18 0342 04/03/18 0344  HGB  --   --  9.2*  --   --   HCT  --   --  29.2*  --   --   PLT  --   --  210  --   --   LABPROT 32.9*  --   --  35.2* 34.9*  INR 3.27  --   --  3.58 3.54  CREATININE  --  8.51*  --   --   --     Estimated Creatinine Clearance: 10.2 mL/min (A) (by C-G formula based on SCr of 8.51 mg/dL (H)).   Assessment: 63 YOM with history of Afib on Coumadin PTA.  INR elevated on admit and was reversed with Vitamin K 1mg  on 03/25/18.  Patient presented with left heel pain/gangrenous toes and underwent amputation on 03/29/18.  Pharmacy consulted to resume Coumadin on 03/30/18.    INR still well above goal with little movement overnight to 3.54. No bleeding issues noted. No cbc this am, will check in am.   Home Coumadin regimen:  2.5mg  PO daily   Goal of Therapy:  INR 2-3 Monitor platelets by anticoagulation protocol: Yes  Resolution of infection    Plan:  Hold coumadin again today Daily PT / INR  Erin Hearing PharmD., BCPS Clinical Pharmacist 04/03/2018 9:06 AM  **Pharmacist phone directory can now be found on [email or web address] ([email or web address]).  Listed under [place].

## 2018-04-03 NOTE — Progress Notes (Signed)
PROGRESS NOTE  Albert [name] ZTI:[id number] DOB: 12-Apr-1955 DOA: 03/20/2018 PCP: Redmond School, MD  HPI/Recap of past 24 hours:  Post op day five Pain is well controlled, no fever, Awaiting for inpatient rehab placement  He is in afib, had afib/rvr with mild hypotension after returning back from dialysis yesterday evening, he received ccb/bb and 500cc fluids bolus He reports he always feel tired after dialysis  Assessment/Plan: Principal Problem:   Cellulitis of left lower extremity Active Problems:   Essential hypertension, benign   Atrial fibrillation (HCC)   ESRD (end stage renal disease) ([place])   Supratherapeutic INR   Necrosis of toe (HCC)   PVD (peripheral vascular disease) ([place])   ESRD on dialysis ([place])  Cellulitis of left lower extremity/bilateral dry gangrenous toes/ severe bilateral peripheral vascular disease: - Transfer from [facility] for arteriogram. - Bilateral angiogram done on 03/28/2018 with balloon angioplasty of the left posterior tibial artery - s/p right transmetatarsal amputation and left second toe amputation on 2/5 by vascular surgery Dr Oneida Alar -coumadin resumed on 2/6 per vascular surgery, he is on asa and statin,  He is treated with IV Ancef since 1/27, he has remained afebrile no leukocytosis. He received total of 14days of ancef, last dose on 2/9 -will follow vascular surgery recommendation  End renal disease on dialysis: Not making urine anymore HD TTS, plan per nephrology, appreciate nephrology input I have discussed with nephrology for possible pulling less volume during dialysis, patient has been having poor oral intake, no signs of volume overload, he has low normal bp and has tachycardia  Anemia of chronic disease hgb stable at baseline on ESA during HD, plan per nephrology  Chronic atrial fibrillation: Continue Coreg and diltiazem,  Heart rate controlled. [place] vasc score 2. Home meds coumadin held, he presented with  supertherapeutic INR (INR 4.5 on 1/28),   vascular surgery cleared patient to resume anticoagulation  Continue cardizem, d/c coreg, change coreg to lopressor for rate control and less effect on bp, titrate up lopressor if bp allows    Diabetes mellitus type II: noninsulin dependent Home meds glipizide and Tradjenta held in the hospital Good control on sliding scale insulin continue current management  Code Status: full  Family Communication: patient   Disposition Plan: CIR placement     Consultants:  Nephrology  Vascular surgery  CIR  Procedures:  HD MWF  Left 2nd toe amputation and right transmetatarsal amputation by vascular surgery on 2/5  Antibiotics:  Ancef, last dose on 2/9   Objective: BP 117/66 (BP Location: Left Arm)   Pulse 75   Temp 98.6 F (37 C) (Oral)   Resp 16   Ht 6\' 2"  (1.88 m)   Wt 95.5 kg   SpO2 97%   BMI 27.03 kg/m   Intake/Output Summary (Last 24 hours) at 04/03/2018 1329 Last data filed at 04/03/2018 1200 Gross per 24 hour  Intake 360 ml  Output -  Net 360 ml   Filed Weights   04/01/18 1721 04/02/18 0550 04/03/18 0407  Weight: 95.5 kg 94 kg 95.5 kg    Exam: Patient is examined daily including today on 04/03/2018, exams remain the same as of yesterday except that has changed    General:  NAD  Cardiovascular: IRRR  Respiratory: CTABL  Abdomen: Soft/ND/NT, positive BS  Musculoskeletal: post op changes  Neuro: alert, oriented   Data Reviewed: Basic Metabolic Panel: Recent Labs  Lab 03/28/18 0347 03/29/18 0350 03/30/18 0314 03/31/18 0336 04/01/18 1302  NA 133*  138 135 136 133*  K 4.6 4.0 4.6 4.5 4.2  CL 91* 96* 89* 89* 92*  CO2 25 29 25 27 24   GLUCOSE 157* 153* 163* 148* 145*  BUN 75* 34* 48* 28* 54*  CREATININE 10.92* 6.61* 8.45* 6.10* 8.51*  CALCIUM 8.4* 8.6* 8.7* 8.9 8.7*  PHOS  --   --   --   --  5.2*   Liver Function Tests: Recent Labs  Lab 04/01/18 1302  ALBUMIN 1.8*   No results for input(s):  LIPASE, AMYLASE in the last 168 hours. No results for input(s): AMMONIA in the last 168 hours. CBC: Recent Labs  Lab 03/28/18 0347 03/29/18 0350 03/30/18 0314 03/31/18 0336 04/01/18 1324  WBC 7.7 7.9 9.8 10.7* 9.1  HGB 9.9* 10.3* 10.5* 10.1* 9.2*  HCT 31.0* 31.8* 33.3* 32.5* 29.2*  MCV 87.3 88.3 89.5 89.5 88.0  PLT 197 219 217 219 210   Cardiac Enzymes:   No results for input(s): CKTOTAL, CKMB, CKMBINDEX, TROPONINI in the last 168 hours. BNP (last 3 results) No results for input(s): BNP in the last 8760 hours.  ProBNP (last 3 results) No results for input(s): PROBNP in the last 8760 hours.  CBG: Recent Labs  Lab 04/02/18 1129 04/02/18 1643 04/02/18 2101 04/03/18 0636 04/03/18 1223  GLUCAP 130* 136* 156* 133* 163*    Recent Results (from the past 240 hour(s))  Surgical pcr screen     Status: None   Collection Time: 03/27/18  5:23 AM  Result Value Ref Range Status   MRSA, PCR NEGATIVE NEGATIVE Final   Staphylococcus aureus NEGATIVE NEGATIVE Final    Comment: (NOTE) The Xpert SA Assay (FDA approved for NASAL specimens in patients 27 years of age and older), is one component of a comprehensive surveillance program. It is not intended to diagnose infection nor to guide or monitor treatment. Performed at [facility], [place] [address], [place], [place] [address]      Studies: No results found.  Scheduled Meds: . allopurinol  100 mg Oral Daily  . aspirin EC  81 mg Oral Daily  . Chlorhexidine Gluconate Cloth  6 each Topical Q0600  . cinacalcet  30 mg Oral Q supper  . darbepoetin (ARANESP) injection - DIALYSIS  60 mcg Intravenous Q Sat-HD  . diltiazem  180 mg Oral Daily  . doxercalciferol  3.5 mcg Intravenous Q T,Th,Sa-HD  . insulin aspart  0-9 Units Subcutaneous TID WC  . metoprolol tartrate  12.5 mg Oral BID  . pantoprazole  40 mg Oral Daily  . pravastatin  5 mg Oral QPM  . senna-docusate  1 tablet Oral BID  . sevelamer carbonate  1,600 mg Oral With  snacks  . sevelamer carbonate  3,200 mg Oral TID WC  . sodium chloride flush  3 mL Intravenous Q12H  . Warfarin - Pharmacist Dosing Inpatient   Does not apply q1800    Continuous Infusions: . sodium chloride    . sodium chloride       Time spent: 41mins, case discussed with nephrology I have personally reviewed and interpreted on  04/03/2018 daily labs, tele strips, imagings as discussed above under date review session and assessment and plans.  I reviewed all nursing notes, pharmacy notes, consultant notes,  vitals, pertinent old records  I have discussed plan of care as described above with RN , patient  on 04/03/2018   Florencia Reasons MD, PhD  [facility] Pager [phone]. If 7PM-7AM, please contact night-coverage at [email or web address], password [facility] 04/03/2018,  1:29 PM  LOS: 14 days

## 2018-04-03 NOTE — Progress Notes (Addendum)
[facility] PHYSICAL MEDICINE & REHABILITATION PROGRESS NOTE  Subjective/Complaints: Patient seen sitting up in his chair this AM.  He states he slept well overnight.    ROS: Denies CP, SOB, N/V/D.  Objective: Vital Signs: Blood pressure 104/64, pulse (!) 108, temperature 98.2 F (36.8 C), temperature source Oral, resp. rate (!) 23, height 6\' 2"  (1.88 m), weight 95.5 kg, SpO2 98 %. No results found. Recent Labs    04/01/18 1324  WBC 9.1  HGB 9.2*  HCT 29.2*  PLT 210   Recent Labs    04/01/18 1302  NA 133*  K 4.2  CL 92*  CO2 24  GLUCOSE 145*  BUN 54*  CREATININE 8.51*  CALCIUM 8.7*    Physical Exam: BP 104/64   Pulse (!) 108   Temp 98.2 F (36.8 C) (Oral)   Resp (!) 23   Ht 6\' 2"  (1.88 m)   Wt 95.5 kg   SpO2 98%   BMI 27.03 kg/m  Constitutional: No distress . Vital signs reviewed. HENT: Normocephalic.  Atraumatic. Eyes: EOMI. No discharge. Cardiovascular: No JVD. Afib with tachycardia. 115-120 at rest, increasing to 135 with MMT. Respiratory: CTA Bilaterally. Normal effort. GI: BS +. Non-distended. Musc: B/l feet with digit amputations and edema Neuro: B/l UE 4+/5 proximal to distal RLE: HF 3+/5, KE 4/5, ADF 3/5 LL: 3+/5, KE 4/5, ADF 2/5 Skin: Incisions with sutures c/d/i   Assessment/Plan   1. S/p b/l LE digit amputation (left 2nd toe, right transmet)  Cont therapies  Work toward prosthesis in future  Cont local wound care  CIR once HR improves and able to tolerate 3 hours of therapy/day  2. ESRD  Recs per Nephro  3. Chronic A.fib   Cont meds  Titrate medications for better control of HR  INR supratherapeutic, adjust meds  4. Post-op pain  Biofeedback training with therapies to help reduce reliance on opiate pain medications, monitor pain control during therapies, and sedation at rest and titrate to maximum efficacy to ensure participation and gains in therapies   LOS: 14 days A FACE TO FACE EVALUATION WAS PERFORMED  Jeslin Bazinet Lorie Phenix 04/03/2018, 11:51 AM

## 2018-04-03 NOTE — Progress Notes (Signed)
[facility]    NEPHROLOGY PROGRESS NOTE  SUBJECTIVE: Poor appetite this morning.  Denies chest pain, shortness of breath, nausea, vomiting, diarrhea or dysuria.  All other review of systems are negative.  Dialysis on Tuesday    OBJECTIVE:  Vitals:   04/03/18 0900 04/03/18 1100  BP:    Pulse:  (!) 105  Resp: 17 16  Temp:    SpO2:      Intake/Output Summary (Last 24 hours) at 04/03/2018 1302 Last data filed at 04/03/2018 1200 Gross per 24 hour  Intake 360 ml  Output -  Net 360 ml      Genearl:  AAOx3 NAD HEENT: MMM [place] AT anicteric sclera Neck:  No JVD, no adenopathy CV:  Heart RRR  Lungs:  L/S CTA bilaterally Abd:  abd SNT/ND with normal BS GU:  Bladder non-palpable Extremities:  No LE edema.  Left upper extremity AV fistula with a good thrill and bruit. Skin:  No skin rash  MEDICATIONS:  . allopurinol  100 mg Oral Daily  . aspirin EC  81 mg Oral Daily  . Chlorhexidine Gluconate Cloth  6 each Topical Q0600  . cinacalcet  30 mg Oral Q supper  . darbepoetin (ARANESP) injection - DIALYSIS  60 mcg Intravenous Q Sat-HD  . diltiazem  180 mg Oral Daily  . doxercalciferol  3.5 mcg Intravenous Q T,Th,Sa-HD  . insulin aspart  0-9 Units Subcutaneous TID WC  . metoprolol tartrate  12.5 mg Oral BID  . pantoprazole  40 mg Oral Daily  . pravastatin  5 mg Oral QPM  . senna-docusate  1 tablet Oral BID  . sevelamer carbonate  1,600 mg Oral With snacks  . sevelamer carbonate  3,200 mg Oral TID WC  . sodium chloride flush  3 mL Intravenous Q12H  . Warfarin - Pharmacist Dosing Inpatient   Does not apply q1800    LABS:   CBC Latest Ref Rng & Units 04/01/2018 03/31/2018 03/30/2018  WBC 4.0 - 10.5 K/uL 9.1 10.7(H) 9.8  Hemoglobin 13.0 - 17.0 g/dL 9.2(L) 10.1(L) 10.5(L)  Hematocrit 39.0 - 52.0 % 29.2(L) 32.5(L) 33.3(L)  Platelets 150 - 400 K/uL 210 219 217    CMP Latest Ref Rng & Units 04/01/2018 03/31/2018 03/30/2018  Glucose 70 - 99 mg/dL 145(H) 148(H) 163(H)  BUN 8 - 23  mg/dL 54(H) 28(H) 48(H)  Creatinine 0.61 - 1.24 mg/dL 8.51(H) 6.10(H) 8.45(H)  Sodium 135 - 145 mmol/L 133(L) 136 135  Potassium 3.5 - 5.1 mmol/L 4.2 4.5 4.6  Chloride 98 - 111 mmol/L 92(L) 89(L) 89(L)  CO2 22 - 32 mmol/L 24 27 25   Calcium 8.9 - 10.3 mg/dL 8.7(L) 8.9 8.7(L)  Total Protein 6.5 - 8.1 g/dL - - -  Total Bilirubin 0.3 - 1.2 mg/dL - - -  Alkaline Phos 38 - 126 U/L - - -  AST 15 - 41 U/L - - -  ALT 17 - 63 U/L - - -    Lab Results  Component Value Date   PTH 350.0 (H) 03/21/2007   CALCIUM 8.7 (L) 04/01/2018   CAION 0.95 (L) 01/18/2017   PHOS 5.2 (H) 04/01/2018       Component Value Date/Time   COLORURINE YELLOW 10/12/2013 [place] 10/12/2013 1045   LABSPEC 1.015 10/12/2013 1045   PHURINE 8.0 10/12/2013 1045   GLUCOSEU NEGATIVE 10/12/2013 1045   HGBUR MODERATE (A) 10/12/2013 1045   BILIRUBINUR NEGATIVE 10/12/2013 [place] 10/12/2013 [place]  30 (A) 10/12/2013 1045   UROBILINOGEN 1.0 10/12/2013 1045   NITRITE NEGATIVE 10/12/2013 1045   LEUKOCYTESUR NEGATIVE 10/12/2013 1045      Component Value Date/Time   PHART 7.405 10/12/2013 0352   PCO2ART 32.2 (L) 10/12/2013 0352   PO2ART 111.0 (H) 10/12/2013 0352   HCO3 20.4 10/12/2013 0352   TCO2 28 01/18/2017 1346   ACIDBASEDEF 3.9 (H) 10/12/2013 0352   O2SAT 99.1 10/12/2013 0352       Component Value Date/Time   IRON 46 03/16/2007 0610   TIBC 358 03/16/2007 0610   FERRITIN 237 03/16/2007 0610   IRONPCTSAT 13 (L) 03/16/2007 0610       ASSESSMENT/PLAN:     1. Bilateral arterial insufficiency- with cellulitis and gangrenous changes.  On abx, S/p angio, s/p TMTs on both sides  2/5. 2. ESRD continue with HD qTTS, on HD- doing well, next HD planned for Tuesday 3. Anemia: cont with ESA.  Hemoglobin is at goal. 4. CKD-MBD: continue with binders and vit D 5. Nutrition: renal diet 6. Hypertension: stable  7. DM- stable 8. Afib: on cardizem, restarting Coumadin per  pharmacy    [facility], DO, [place]

## 2018-04-03 NOTE — Progress Notes (Addendum)
[facility]  Subjective  - Unable to ambulate max assistants for tranfers.   Objective 100/75 (!) 105 98.2 F (36.8 C) (Oral) (!) 23 98%  Intake/Output Summary (Last 24 hours) at 04/03/2018 0743 Last data filed at 04/02/2018 2003 Gross per 24 hour  Intake 240 ml  Output -  Net 240 ml    Doppler signals DP/AT B LE Bilateral feet incisions healing well, left superficial ulcer over Achilles   Assessment/Planning: #1 left transmetatarsal amputation second toe  2. Right transmetatarsal amputation of toes 1 2 and 3  3 days post op Pending SNf placement Coumadin restarted   [name] 04/03/2018 7:43 AM --  Laboratory Lab Results: Recent Labs    04/01/18 1324  WBC 9.1  HGB 9.2*  HCT 29.2*  PLT 210   BMET Recent Labs    04/01/18 1302  NA 133*  K 4.2  CL 92*  CO2 24  GLUCOSE 145*  BUN 54*  CREATININE 8.51*  CALCIUM 8.7*    COAG Lab Results  Component Value Date   INR 3.54 04/03/2018   INR 3.58 04/02/2018   INR 3.27 04/01/2018   No results found for: PTT   I have independently interviewed and examined patient and agree with PA assessment and plan above.  Dorsalis pedis signals bilaterally are strongest.  Should have enough blood flow to heal his right 3 toe transmetatarsal amputation although medially this is tenuous.  The left foot ulceration on the toe might healed where it was amputated but the heel itself over the Achilles does seem to be worsening.  He remains at high risk for bilateral below-knee amputation with the left being the highest risk.  He demonstrates good understanding of this.  He is okay for discharge to rehab and we will see him in a few weeks for wound check in the office.  Selam Pietsch C. Donzetta Matters, MD [facility] Office: [phone] Pager: [phone]

## 2018-04-03 NOTE — Consult Note (Signed)
   [facility] Inpatient Consult   04/03/2018  FERGUS THRONE [birth year] [id number]    [facility] Care Management hospital liaison follow up.   Discussed with inpatient RNCM that writer is following for potential [facility] Care Management needs. Writer informed that Mr. Fridman will likely transition to CIR pending insurance approval.   Will continue to follow along for potential [facility] Care Management services pending disposition.   Marthenia Rolling, MSN-Ed, RN,BSN [facility] Liaison [phone]

## 2018-04-03 NOTE — Progress Notes (Signed)
Occupational Therapy Treatment Patient Details Name: Albert [name] MRN: [id number] DOB: 1955-12-10 Today's Date: 04/03/2018    History of present illness Pt is a 63 y.o. M with significant PMH of ESRD, PVD, who presents s/p right transmetarsal amputation and left amputation of 2nd toe. Of note, pt also has heel ulcer that is superficial over the Achilles.   OT comments  Pt making progress with functional goals, however is limited by pain in B feet during mobility/transfers. Pt very pleasant and cooperative, eager to go to CIR if able. OT will continue to follow acutely  Follow Up Recommendations  CIR    Equipment Recommendations  Other (comment)(TBD at next venue of care)    Recommendations for Other Services      Precautions / Restrictions Precautions Precautions: Fall Required Braces or Orthoses: Other Brace Other Brace: right darco, left post op shoe Restrictions Weight Bearing Restrictions: Yes RLE Weight Bearing: Partial weight bearing RLE Partial Weight Bearing Percentage or Pounds: heel weightbearing with Darco shoe LLE Weight Bearing: Weight bearing as tolerated       Mobility Bed Mobility Overal bed mobility: Needs Assistance Bed Mobility: Supine to Sit     Supine to sit: Supervision     General bed mobility comments: pt in recliner upon arrival  Transfers Overall transfer level: Needs assistance Equipment used: Rolling walker (2 wheeled) Transfers: Sit to/from Stand Sit to Stand: Max assist         General transfer comment: cues for correct hand placement, required 3 trials sit - stand before initiating SPT    Balance Overall balance assessment: Needs assistance Sitting-balance support: Feet supported Sitting balance-Leahy Scale: Good     Standing balance support: Bilateral upper extremity supported;During functional activity Standing balance-Leahy Scale: Poor Standing balance comment: reliant on BUE support to maintain standing balance                            ADL either performed or assessed with clinical judgement   ADL Overall ADL's : Needs assistance/impaired         Upper Body Bathing: Set up;Supervision/ safety;Sitting Upper Body Bathing Details (indicate cue type and reason): simulated Lower Body Bathing: Sitting/lateral leans;Maximal assistance;Moderate assistance Lower Body Bathing Details (indicate cue type and reason): simulated Upper Body Dressing : Set up;Supervision/safety;Sitting   Lower Body Dressing: Total assistance   Toilet Transfer: Cueing for safety;Maximal assistance;Stand-pivot Toilet Transfer Details (indicate cue type and reason): simulated to recliner Toileting- Clothing Manipulation and Hygiene: Total assistance       Functional mobility during ADLs: Moderate assistance;Maximal assistance       Vision Patient Visual Report: No change from baseline     Perception     Praxis      Cognition Arousal/Alertness: Awake/alert Behavior During Therapy: WFL for tasks assessed/performed Overall Cognitive Status: Within Functional Limits for tasks assessed                                          Exercises Exercises: General Lower Extremity General Exercises - Lower Extremity Ankle Circles/Pumps: AROM;Both;20 reps;Seated Long Arc Quad: AROM;Both;10 reps;Seated   Shoulder Instructions       General Comments Reviewed weight bearing precautions with patient.     Pertinent Vitals/ Pain       Pain Assessment: 0-10 Pain Score: 6  Pain Location: L/R feet Pain Descriptors /  Indicators: Discomfort;Grimacing;Guarding;Operative site guarding Pain Intervention(s): Premedicated before session;Limited activity within patient's tolerance;Monitored during session;Repositioned  Home Living                                          Prior Functioning/Environment              Frequency  Min 2X/week        Progress Toward Goals  OT  Goals(current goals can now be found in the care plan section)  Progress towards OT goals: Progressing toward goals  Acute Rehab OT Goals Patient Stated Goal: decrease pain  Plan Discharge plan remains appropriate    Co-evaluation                 AM-PAC OT "6 Clicks" Daily Activity     Outcome Measure   Help from another person eating meals?: None Help from another person taking care of personal grooming?: None Help from another person toileting, which includes using toliet, bedpan, or urinal?: Total Help from another person bathing (including washing, rinsing, drying)?: A Lot Help from another person to put on and taking off regular upper body clothing?: None Help from another person to put on and taking off regular lower body clothing?: Total 6 Click Score: 16    End of Session Equipment Utilized During Treatment: Gait belt;Rolling walker  OT Visit Diagnosis: Unsteadiness on feet (R26.81);Other abnormalities of gait and mobility (R26.89);Muscle weakness (generalized) (M62.81);Pain Pain - Right/Left: (bilaterally) Pain - part of body: Ankle and joints of foot   Activity Tolerance Patient limited by pain   Patient Left in chair;with call bell/phone within reach   Nurse Communication          Time: 1001-1019 OT Time Calculation (min): 18 min  Charges: OT Treatments $Self Care/Home Management : 8-22 mins     [name] 04/03/2018, 12:37 PM

## 2018-04-04 DIAGNOSIS — L8990 Pressure ulcer of unspecified site, unspecified stage: Secondary | ICD-10-CM

## 2018-04-04 LAB — CBC
HCT: 31.6 % — ABNORMAL LOW (ref 39.0–52.0)
Hemoglobin: 10.1 g/dL — ABNORMAL LOW (ref 13.0–17.0)
MCH: 28 pg (ref 26.0–34.0)
MCHC: 32 g/dL (ref 30.0–36.0)
MCV: 87.5 fL (ref 80.0–100.0)
PLATELETS: 252 10*3/uL (ref 150–400)
RBC: 3.61 MIL/uL — ABNORMAL LOW (ref 4.22–5.81)
RDW: 14.9 % (ref 11.5–15.5)
WBC: 14 10*3/uL — AB (ref 4.0–10.5)
nRBC: 0.1 % (ref 0.0–0.2)

## 2018-04-04 LAB — RENAL FUNCTION PANEL
Albumin: 1.7 g/dL — ABNORMAL LOW (ref 3.5–5.0)
Anion gap: 16 — ABNORMAL HIGH (ref 5–15)
BUN: 74 mg/dL — ABNORMAL HIGH (ref 8–23)
CALCIUM: 8.7 mg/dL — AB (ref 8.9–10.3)
CO2: 22 mmol/L (ref 22–32)
Chloride: 95 mmol/L — ABNORMAL LOW (ref 98–111)
Creatinine, Ser: 10.34 mg/dL — ABNORMAL HIGH (ref 0.61–1.24)
GFR calc Af Amer: 5 mL/min — ABNORMAL LOW (ref >60)
GFR, EST NON AFRICAN AMERICAN: 5 mL/min — AB (ref >60)
Glucose, Bld: 129 mg/dL — ABNORMAL HIGH (ref 70–99)
Phosphorus: 4.2 mg/dL (ref 2.5–4.6)
Potassium: 4.1 mmol/L (ref 3.5–5.1)
SODIUM: 133 mmol/L — AB (ref 135–145)

## 2018-04-04 LAB — GLUCOSE, CAPILLARY
Glucose-Capillary: 134 mg/dL — ABNORMAL HIGH (ref 70–99)
Glucose-Capillary: 153 mg/dL — ABNORMAL HIGH (ref 70–99)
Glucose-Capillary: 107 mg/dL — ABNORMAL HIGH (ref 70–99)
Glucose-Capillary: 187 mg/dL — ABNORMAL HIGH (ref 70–99)

## 2018-04-04 LAB — PROTIME-INR
INR: 3.73
Prothrombin Time: 36.3 seconds — ABNORMAL HIGH (ref 11.4–15.2)

## 2018-04-04 LAB — TSH: TSH: 3.182 u[IU]/mL (ref 0.350–4.500)

## 2018-04-04 LAB — MAGNESIUM: Magnesium: 1.9 mg/dL (ref 1.7–2.4)

## 2018-04-04 MED ORDER — DARBEPOETIN ALFA 100 MCG/0.5ML IJ SOSY
100.0000 ug | PREFILLED_SYRINGE | INTRAMUSCULAR | Status: DC
  Administered 2018-04-08: 100 ug via INTRAVENOUS
  Filled 2018-04-04: qty 0.5

## 2018-04-04 MED ORDER — HYDROCODONE-ACETAMINOPHEN 5-325 MG PO TABS
ORAL_TABLET | ORAL | Status: AC
  Filled 2018-04-04: qty 1

## 2018-04-04 MED ORDER — DOXERCALCIFEROL 4 MCG/2ML IV SOLN
INTRAVENOUS | Status: AC
  Filled 2018-04-04: qty 2

## 2018-04-04 MED ORDER — HEPARIN SODIUM (PORCINE) 1000 UNIT/ML DIALYSIS
1000.0000 [IU] | INTRAMUSCULAR | Status: DC | PRN
  Filled 2018-04-04: qty 1

## 2018-04-04 MED ORDER — SODIUM CHLORIDE 0.9 % IV SOLN: 100.0000 mL | INTRAVENOUS | Status: DC | PRN

## 2018-04-04 NOTE — Progress Notes (Signed)
OT Cancellation Note  Patient Details Name: Albert [name] MRN: [id number] DOB: 10/29/1955   Cancelled Treatment:    Reason Eval/Treat Not Completed: Patient declined, no reason specified. Pt refused getting OOB for activity stating that he's waiting to go to HD, although pt's RN reports that he most likely will not go to HD until around noon. OT educated pt on the benefits/importance of being OOB, however he continued to refuse  [name] 04/04/2018, 10:53 AM

## 2018-04-04 NOTE — Progress Notes (Signed)
Patient heart rate increasing to 140s-150s on monitor. Patient BP 125/73. Metoprolol 5mg  IV given as ordered as needed for heart rate greater than 120.  1926 bp 108/74 will monitor patient. Report given to on coming nurse. Rawson Minix, Bettina Gavia RN

## 2018-04-04 NOTE — Care Management Important Message (Signed)
Important Message  Patient Details  Name: Albert [name] MRN: [id number] Date of Birth: May 30, 1955   Medicare Important Message Given:  Yes    Barb Merino Carry Weesner 04/04/2018, 4:43 PM

## 2018-04-04 NOTE — Progress Notes (Signed)
Physical Therapy Treatment Patient Details Name: Albert [name] MRN: [id number] DOB: 10-Nov-1955 Today's Date: 04/04/2018    History of Present Illness Pt is a 63 y.o. M with significant PMH of ESRD, PVD, who presents s/p right transmetarsal amputation and left amputation of 2nd toe. Of note, pt also has heel ulcer that is superficial over the Achilles.    PT Comments    Pt performs seated therex today with no c/o pain. Pt requires increased time and cuing to follow directions this session, distracted by wondering when he will go to dialysis.    Follow Up Recommendations        Equipment Recommendations       Recommendations for Other Services       Precautions / Restrictions Precautions Precautions: Fall Other Brace: right darco, left post op shoe Restrictions RLE Weight Bearing: Partial weight bearing RLE Partial Weight Bearing Percentage or Pounds: heel weightbearing with Darco shoe LLE Weight Bearing: Weight bearing as tolerated    Mobility  Bed Mobility Overal bed mobility: Needs Assistance Bed Mobility: Supine to Sit;Sit to Supine     Supine to sit: Min assist Sit to supine: Min assist   General bed mobility comments: assist for LEs and trunk  Transfers                    Ambulation/Gait                 Stairs             Wheelchair Mobility    Modified Rankin (Stroke Patients Only)       Balance                                            Cognition Arousal/Alertness: Awake/alert Behavior During Therapy: WFL for tasks assessed/performed Overall Cognitive Status: No family/caregiver present to determine baseline cognitive functioning                                 General Comments: pt requires increased time to follow directions      Exercises General Exercises - Upper Extremity Shoulder Flexion: AROM;10 reps;Both;Seated Shoulder ABduction: AROM;10 reps;Seated;Both General  Exercises - Lower Extremity Ankle Circles/Pumps: AROM;Both;20 reps;Seated Long Arc Quad: AROM;Both;10 reps;Seated Hip ABduction/ADduction: AROM;10 reps;Both;Seated Hip Flexion/Marching: AROM;10 reps;Both;Seated    General Comments        Pertinent Vitals/Pain Pain Assessment: No/denies pain    Home Living                      Prior Function            PT Goals (current goals can now be found in the care plan section) Progress towards PT goals: Progressing toward goals    Frequency           PT Plan Current plan remains appropriate    Co-evaluation              AM-PAC PT "6 Clicks" Mobility   Outcome Measure  Help needed turning from your back to your side while in a flat bed without using bedrails?: A Little Help needed moving from lying on your back to sitting on the side of a flat bed without using bedrails?: A Little Help needed moving to and from a bed  to a chair (including a wheelchair)?: A Lot Help needed standing up from a chair using your arms (e.g., wheelchair or bedside chair)?: Total Help needed to walk in hospital room?: Total Help needed climbing 3-5 steps with a railing? : Total 6 Click Score: 11    End of Session   Activity Tolerance: Patient tolerated treatment well Patient left: in bed;with call bell/phone within reach;with bed alarm set   PT Visit Diagnosis: Other abnormalities of gait and mobility (R26.89);Muscle weakness (generalized) (M62.81);Difficulty in walking, not elsewhere classified (R26.2);Pain     Time: 0272-5366 PT Time Calculation (min) (ACUTE ONLY): 15 min  Charges:  $Therapeutic Exercise: 8-22 mins                     Isabelle Course, PT, DPT   DONAWERTH,KAREN 04/04/2018, 9:49 AM

## 2018-04-04 NOTE — Progress Notes (Addendum)
PROGRESS NOTE  Albert [name] UDJ:[id number] DOB: 1956-02-10 DOA: 03/20/2018 PCP: Redmond School, MD  Brief Summary:  H/o chronic afib,on warfarin, DM2, ESRD on HD TTS, here for left lower extremity cellulitis, PVD.  s/p bilateral toe amputation on 03/29/2018, vascular surgery following. CIR placement pending heart rate control and inr level, pending insurance authorization as well     HPI/Recap of past 24 hours:  Post op day six Pain is well controlled, no fever, Awaiting for inpatient rehab placement  He is in afib, his heart rate  Improved at rest, still having tachycardia with activity or after dialysis    Assessment/Plan: Principal Problem:   Cellulitis of left lower extremity Active Problems:   Essential hypertension, benign   Atrial fibrillation (HCC)   ESRD (end stage renal disease) (HCC)   Supratherapeutic INR   Necrosis of toe (HCC)   PVD (peripheral vascular disease) ([place])   ESRD on dialysis ([place])   Pressure injury of skin  Cellulitis of left lower extremity/bilateral dry gangrenous toes/ severe bilateral peripheral vascular disease: - Transfer from [facility] for arteriogram. - Bilateral angiogram done on 03/28/2018 with balloon angioplasty of the left posterior tibial artery - s/p right transmetatarsal amputation and left second toe amputation on 2/5 by vascular surgery Dr Oneida Alar, he is at risk of bilateral AKA per vascular -coumadin resumed on 2/6 per vascular surgery, he is on asa and statin,  He is treated with IV Ancef since 1/27, he has remained afebrile no leukocytosis. He received total of 14days of ancef, last dose on 2/9 -will follow vascular surgery recommendation  End renal disease on dialysis: Not making urine anymore HD TTS, plan per nephrology, appreciate nephrology input I have discussed with nephrology on 2/10 for possible pulling less volume during dialysis, patient has been having poor oral intake, no signs of volume overload,  he has low normal bp and has tachycardia after dialysis  Anemia of chronic disease hgb stable at baseline on ESA during HD, plan per nephrology  Chronic atrial fibrillation: Continue Coreg and diltiazem,  Heart rate controlled. [place] vasc score 2. Home meds coumadin held, he presented with supertherapeutic INR (INR 4.5 on 1/28),   vascular surgery cleared patient to resume anticoagulation , coumadin dosing per pharmacy Continue cardizem, d/c coreg, change coreg to lopressor for rate control and less effect on bp, titrate up lopressor if bp allows    Diabetes mellitus type II: noninsulin dependent Home meds glipizide and Tradjenta held in the hospital Good control on sliding scale insulin continue current management  Code Status: full  Family Communication: patient   Disposition Plan: CIR placement pending heart rate control, INR level and insurance approval    Consultants:  Nephrology  Vascular surgery  CIR  Procedures:  HD MWF  Left 2nd toe amputation and right transmetatarsal amputation toe 1,2,3  by vascular surgery on 2/5  Antibiotics:  Ancef, last dose on 2/9   Objective: BP 108/70   Pulse 60   Temp 98.1 F (36.7 C) (Oral)   Resp 20   Ht 6\' 2"  (1.88 m)   Wt 94.1 kg   SpO2 98%   BMI 26.64 kg/m   Intake/Output Summary (Last 24 hours) at 04/04/2018 1701 Last data filed at 04/04/2018 1328 Gross per 24 hour  Intake 140 ml  Output 0 ml  Net 140 ml   Filed Weights   04/03/18 0407 04/04/18 0531 04/04/18 1328  Weight: 95.5 kg 94.3 kg 94.1 kg    Exam: Patient  is examined daily including today on 04/04/2018, exams remain the same as of yesterday except that has changed    General:  NAD  Cardiovascular: IRRR  Respiratory: CTABL  Abdomen: Soft/ND/NT, positive BS  Musculoskeletal: post op changes bilateral foot, tender wound on left achillie  Neuro: alert, oriented   Data Reviewed: Basic Metabolic Panel: Recent Labs  Lab 03/29/18 0350  03/30/18 0314 03/31/18 0336 04/01/18 1302 04/04/18 0401 04/04/18 1429  NA 138 135 136 133*  --  133*  K 4.0 4.6 4.5 4.2  --  4.1  CL 96* 89* 89* 92*  --  95*  CO2 29 25 27 24   --  22  GLUCOSE 153* 163* 148* 145*  --  129*  BUN 34* 48* 28* 54*  --  74*  CREATININE 6.61* 8.45* 6.10* 8.51*  --  10.34*  CALCIUM 8.6* 8.7* 8.9 8.7*  --  8.7*  MG  --   --   --   --  1.9  --   PHOS  --   --   --  5.2*  --  4.2   Liver Function Tests: Recent Labs  Lab 04/01/18 1302 04/04/18 1429  ALBUMIN 1.8* 1.7*   No results for input(s): LIPASE, AMYLASE in the last 168 hours. No results for input(s): AMMONIA in the last 168 hours. CBC: Recent Labs  Lab 03/29/18 0350 03/30/18 0314 03/31/18 0336 04/01/18 1324 04/04/18 1431  WBC 7.9 9.8 10.7* 9.1 14.0*  HGB 10.3* 10.5* 10.1* 9.2* 10.1*  HCT 31.8* 33.3* 32.5* 29.2* 31.6*  MCV 88.3 89.5 89.5 88.0 87.5  PLT 219 217 219 210 252   Cardiac Enzymes:   No results for input(s): CKTOTAL, CKMB, CKMBINDEX, TROPONINI in the last 168 hours. BNP (last 3 results) No results for input(s): BNP in the last 8760 hours.  ProBNP (last 3 results) No results for input(s): PROBNP in the last 8760 hours.  CBG: Recent Labs  Lab 04/03/18 1223 04/03/18 1607 04/03/18 2127 04/04/18 0604 04/04/18 1137  GLUCAP 163* 96 136* 134* 153*    Recent Results (from the past 240 hour(s))  Surgical pcr screen     Status: None   Collection Time: 03/27/18  5:23 AM  Result Value Ref Range Status   MRSA, PCR NEGATIVE NEGATIVE Final   Staphylococcus aureus NEGATIVE NEGATIVE Final    Comment: (NOTE) The Xpert SA Assay (FDA approved for NASAL specimens in patients 80 years of age and older), is one component of a comprehensive surveillance program. It is not intended to diagnose infection nor to guide or monitor treatment. Performed at [facility], [place] [name], [place], [place] [address]      Studies: No results found.  Scheduled Meds: . allopurinol  100  mg Oral Daily  . aspirin EC  81 mg Oral Daily  . Chlorhexidine Gluconate Cloth  6 each Topical Q0600  . cinacalcet  30 mg Oral Q supper  . [START ON 04/08/2018] darbepoetin (ARANESP) injection - DIALYSIS  100 mcg Intravenous Q Sat-HD  . diltiazem  180 mg Oral Daily  . doxercalciferol  3.5 mcg Intravenous Q T,Th,Sa-HD  . insulin aspart  0-9 Units Subcutaneous TID WC  . metoprolol tartrate  12.5 mg Oral BID  . pantoprazole  40 mg Oral Daily  . pravastatin  5 mg Oral QPM  . senna-docusate  1 tablet Oral BID  . sevelamer carbonate  1,600 mg Oral With snacks  . sevelamer carbonate  3,200 mg Oral TID WC  .  sodium chloride flush  3 mL Intravenous Q12H  . Warfarin - Pharmacist Dosing Inpatient   Does not apply q1800    Continuous Infusions: . sodium chloride    . sodium chloride    . sodium chloride       Time spent: 50mins, I have personally reviewed and interpreted on  04/04/2018 daily labs, tele strips, imagings as discussed above under date review session and assessment and plans.  I reviewed all nursing notes, pharmacy notes, consultant notes,  vitals, pertinent old records  I have discussed plan of care as described above with RN , patient  on 04/04/2018   Florencia Reasons MD, PhD  [facility] Pager [phone]. If 7PM-7AM, please contact night-coverage at [email or web address], password [facility] 04/04/2018, 5:01 PM  LOS: 15 days

## 2018-04-04 NOTE — Progress Notes (Signed)
ANTICOAGULATION CONSULT NOTE  Pharmacy Consult:  Coumadin  Indication: atrial fibrillation   Allergies  Allergen Reactions  . Metformin And Related Other (See Comments)    Diarrhea, bloody stool, upset stomach.     Patient Measurements: Height: 6\' 2"  (188 cm) Weight: 207 lb 14.3 oz (94.3 kg) IBW/kg (Calculated) : 82.2  Vital Signs: Temp: 98.2 F (36.8 C) (02/11 0531) Temp Source: Oral (02/11 0531) BP: 100/67 (02/11 0531) Pulse Rate: 116 (02/11 0531)  Labs: Recent Labs    04/01/18 1302 04/01/18 1324 04/02/18 0342 04/03/18 0344 04/04/18 0401  HGB  --  9.2*  --   --   --   HCT  --  29.2*  --   --   --   PLT  --  210  --   --   --   LABPROT  --   --  35.2* 34.9* 36.3*  INR  --   --  3.58 3.54 3.73  CREATININE 8.51*  --   --   --   --     Estimated Creatinine Clearance: 10.2 mL/min (A) (by C-G formula based on SCr of 8.51 mg/dL (H)).   Assessment: 48 YOM with history of Afib on Coumadin PTA.  INR elevated on admit and was reversed with Vitamin K 1mg  on 03/25/18.  Patient presented with left heel pain/gangrenous toes and underwent amputation on 03/29/18.  Pharmacy consulted to resume Coumadin on 03/30/18.    INR still well above goal, trended up again overnight to 3.7. No bleeding issues noted. No cbc this am, check 2/12.  Home Coumadin regimen:  2.5mg  PO daily   Goal of Therapy:  INR 2-3 Monitor platelets by anticoagulation protocol: Yes  Resolution of infection    Plan:  Hold coumadin for now Daily PT / INR  Erin Hearing PharmD., BCPS Clinical Pharmacist 04/04/2018 9:11 AM  **Pharmacist phone directory can now be found on [email or web address] ([email or web address]).  Listed under [place].

## 2018-04-04 NOTE — Progress Notes (Signed)
[facility]    NEPHROLOGY PROGRESS NOTE  SUBJECTIVE:   Seen in room. Denies chest pain, shortness of breath, nausea, vomiting, diarrhea or dysuria.  All other review of systems are negative.  Dialysis today.    OBJECTIVE:  Vitals:   04/04/18 0531 04/04/18 1142  BP: 100/67 106/78  Pulse: (!) 116 (!) 113  Resp: 19 18  Temp: 98.2 F (36.8 C) 98.2 F (36.8 C)  SpO2: 99% 99%    Intake/Output Summary (Last 24 hours) at 04/04/2018 1211 Last data filed at 04/04/2018 0900 Gross per 24 hour  Intake 140 ml  Output 0 ml  Net 140 ml      Genearl:  AAOx3 NAD HEENT: MMM [place] AT anicteric sclera CV:  Heart RRR  Lungs:  L/S CTA bilaterally Abd:  abd SNT/ND with normal BS Extremities:  No LE edema.  Left upper extremity AV fistula with a good thrill and bruit. Skin:  No skin rash Neuro; Conversant  MEDICATIONS:  . allopurinol  100 mg Oral Daily  . aspirin EC  81 mg Oral Daily  . Chlorhexidine Gluconate Cloth  6 each Topical Q0600  . cinacalcet  30 mg Oral Q supper  . darbepoetin (ARANESP) injection - DIALYSIS  60 mcg Intravenous Q Sat-HD  . diltiazem  180 mg Oral Daily  . doxercalciferol  3.5 mcg Intravenous Q T,Th,Sa-HD  . insulin aspart  0-9 Units Subcutaneous TID WC  . metoprolol tartrate  12.5 mg Oral BID  . pantoprazole  40 mg Oral Daily  . pravastatin  5 mg Oral QPM  . senna-docusate  1 tablet Oral BID  . sevelamer carbonate  1,600 mg Oral With snacks  . sevelamer carbonate  3,200 mg Oral TID WC  . sodium chloride flush  3 mL Intravenous Q12H  . Warfarin - Pharmacist Dosing Inpatient   Does not apply q1800    LABS:   CBC Latest Ref Rng & Units 04/01/2018 03/31/2018 03/30/2018  WBC 4.0 - 10.5 K/uL 9.1 10.7(H) 9.8  Hemoglobin 13.0 - 17.0 g/dL 9.2(L) 10.1(L) 10.5(L)  Hematocrit 39.0 - 52.0 % 29.2(L) 32.5(L) 33.3(L)  Platelets 150 - 400 K/uL 210 219 217    CMP Latest Ref Rng & Units 04/01/2018 03/31/2018 03/30/2018  Glucose 70 - 99 mg/dL 145(H) 148(H) 163(H)  BUN  8 - 23 mg/dL 54(H) 28(H) 48(H)  Creatinine 0.61 - 1.24 mg/dL 8.51(H) 6.10(H) 8.45(H)  Sodium 135 - 145 mmol/L 133(L) 136 135  Potassium 3.5 - 5.1 mmol/L 4.2 4.5 4.6  Chloride 98 - 111 mmol/L 92(L) 89(L) 89(L)  CO2 22 - 32 mmol/L 24 27 25   Calcium 8.9 - 10.3 mg/dL 8.7(L) 8.9 8.7(L)  Total Protein 6.5 - 8.1 g/dL - - -  Total Bilirubin 0.3 - 1.2 mg/dL - - -  Alkaline Phos 38 - 126 U/L - - -  AST 15 - 41 U/L - - -  ALT 17 - 63 U/L - - -    Lab Results  Component Value Date   PTH 350.0 (H) 03/21/2007   CALCIUM 8.7 (L) 04/01/2018   CAION 0.95 (L) 01/18/2017   PHOS 5.2 (H) 04/01/2018       Component Value Date/Time   COLORURINE YELLOW 10/12/2013 [place] 10/12/2013 1045   LABSPEC 1.015 10/12/2013 1045   PHURINE 8.0 10/12/2013 [place] 10/12/2013 1045   HGBUR MODERATE (A) 10/12/2013 1045   BILIRUBINUR NEGATIVE 10/12/2013 [place] 10/12/2013 1045   PROTEINUR 30 (  A) 10/12/2013 1045   UROBILINOGEN 1.0 10/12/2013 1045   NITRITE NEGATIVE 10/12/2013 1045   LEUKOCYTESUR NEGATIVE 10/12/2013 1045      Component Value Date/Time   PHART 7.405 10/12/2013 0352   PCO2ART 32.2 (L) 10/12/2013 0352   PO2ART 111.0 (H) 10/12/2013 0352   HCO3 20.4 10/12/2013 0352   TCO2 28 01/18/2017 1346   ACIDBASEDEF 3.9 (H) 10/12/2013 0352   O2SAT 99.1 10/12/2013 0352       Component Value Date/Time   IRON 46 03/16/2007 0610   TIBC 358 03/16/2007 0610   FERRITIN 237 03/16/2007 0610   IRONPCTSAT 13 (L) 03/16/2007 0610    Hemodialysis prescription: TTS, 4 hours 15 minutes, [facility].  Dry weight 97 kg.  Left brachiocephalic fistula.   ASSESSMENT/PLAN:     1. Bilateral arterial insufficiency- with cellulitis and gangrenous changes.  On abx, S/p angio, s/p TMTs on both sides  2/5.  Per [facility] note 2/11 - f/u 3 wks for wound check, at risk for BL AKA. 2. ESRD continue with HD qTTS Dr.  Lowanda Foster, on HD- doing well, next HD planned for today 3. Anemia:  cont with ESA.  Hemoglobin sl below goal, ^d darbo 2/11. 4. CKD-MBD: continue with binders and vit D 5. Nutrition: renal diet 6. Hypertension: stable  7. DM- stable 8. Afib: on cardizem and metoprolol, Coumadin per pharmacy  Jannifer Hick MD

## 2018-04-04 NOTE — Progress Notes (Signed)
  Progress Note    04/04/2018 7:25 AM 6 Days Post-Op  Subjective:  Says he is doing ok but the back of his left leg is painful  afebrile  Vitals:   04/03/18 2113 04/04/18 0531  BP: 99/67 100/67  Pulse:  (!) 116  Resp: 17 19  Temp:  98.2 F (36.8 C)  SpO2:  99%    Physical Exam: Cardiac:  regular Lungs:  Non labored Incisions:    Right foot  Left foot  Left achilles wound  Extremities:   brisk bilatearl DP and right PT doppler signals; monophasic left PT   CBC    Component Value Date/Time   WBC 9.1 04/01/2018 1324   RBC 3.32 (L) 04/01/2018 1324   HGB 9.2 (L) 04/01/2018 1324   HCT 29.2 (L) 04/01/2018 1324   PLT 210 04/01/2018 1324   MCV 88.0 04/01/2018 1324   MCH 27.7 04/01/2018 1324   MCHC 31.5 04/01/2018 1324   RDW 14.6 04/01/2018 1324   LYMPHSABS 0.7 03/20/2018 1931   MONOABS 0.7 03/20/2018 1931   EOSABS 0.1 03/20/2018 1931   BASOSABS 0.0 03/20/2018 1931    BMET    Component Value Date/Time   NA 133 (L) 04/01/2018 1302   K 4.2 04/01/2018 1302   CL 92 (L) 04/01/2018 1302   CO2 24 04/01/2018 1302   GLUCOSE 145 (H) 04/01/2018 1302   BUN 54 (H) 04/01/2018 1302   CREATININE 8.51 (H) 04/01/2018 1302   CALCIUM 8.7 (L) 04/01/2018 1302   CALCIUM 9.0 03/21/2007 0415   GFRNONAA 6 (L) 04/01/2018 1302   GFRAA 7 (L) 04/01/2018 1302    INR    Component Value Date/Time   INR 3.73 04/04/2018 0401     Intake/Output Summary (Last 24 hours) at 04/04/2018 0725 Last data filed at 04/04/2018 0600 Gross per 24 hour  Intake 300 ml  Output 0 ml  Net 300 ml     Assessment:  63 y.o. male is s/p:  #1 left transmetatarsal amputation second toe 2. Right transmetatarsal amputation of toes 1 2 and 3  6 Days Post-Op  Plan: -pt with brisk bilatearl DP and right PT doppler signals; monophasic left PT -pt is at risk for bilateral AKA.   -disposition is pending -f/u with [facility]VS in 3 weeks for wound check  Leontine Locket, PA-C [facility] [phone] 04/04/2018 7:25 AM

## 2018-04-05 DIAGNOSIS — I482 Chronic atrial fibrillation, unspecified: Secondary | ICD-10-CM

## 2018-04-05 LAB — GLUCOSE, CAPILLARY
Glucose-Capillary: 156 mg/dL — ABNORMAL HIGH (ref 70–99)
Glucose-Capillary: 160 mg/dL — ABNORMAL HIGH (ref 70–99)
Glucose-Capillary: 140 mg/dL — ABNORMAL HIGH (ref 70–99)
Glucose-Capillary: 137 mg/dL — ABNORMAL HIGH (ref 70–99)
Glucose-Capillary: 149 mg/dL — ABNORMAL HIGH (ref 70–99)

## 2018-04-05 LAB — CBC
HCT: 33 % — ABNORMAL LOW (ref 39.0–52.0)
Hemoglobin: 10.1 g/dL — ABNORMAL LOW (ref 13.0–17.0)
MCH: 26.8 pg (ref 26.0–34.0)
MCHC: 30.6 g/dL (ref 30.0–36.0)
MCV: 87.5 fL (ref 80.0–100.0)
Platelets: 255 10*3/uL (ref 150–400)
RBC: 3.77 MIL/uL — ABNORMAL LOW (ref 4.22–5.81)
RDW: 14.9 % (ref 11.5–15.5)
WBC: 13.4 10*3/uL — ABNORMAL HIGH (ref 4.0–10.5)
nRBC: 0 % (ref 0.0–0.2)

## 2018-04-05 LAB — RENAL FUNCTION PANEL
Albumin: 1.8 g/dL — ABNORMAL LOW (ref 3.5–5.0)
Anion gap: 13 (ref 5–15)
BUN: 31 mg/dL — AB (ref 8–23)
CO2: 27 mmol/L (ref 22–32)
Calcium: 8.5 mg/dL — ABNORMAL LOW (ref 8.9–10.3)
Chloride: 95 mmol/L — ABNORMAL LOW (ref 98–111)
Creatinine, Ser: 6.13 mg/dL — ABNORMAL HIGH (ref 0.61–1.24)
GFR calc Af Amer: 10 mL/min — ABNORMAL LOW (ref >60)
GFR calc non Af Amer: 9 mL/min — ABNORMAL LOW (ref >60)
Glucose, Bld: 146 mg/dL — ABNORMAL HIGH (ref 70–99)
Phosphorus: 4.1 mg/dL (ref 2.5–4.6)
Potassium: 3.7 mmol/L (ref 3.5–5.1)
Sodium: 135 mmol/L (ref 135–145)

## 2018-04-05 LAB — PROTIME-INR
INR: 3.87
Prothrombin Time: 37.4 seconds — ABNORMAL HIGH (ref 11.4–15.2)

## 2018-04-05 MED ORDER — CHLORHEXIDINE GLUCONATE CLOTH 2 % EX PADS: 6.0000 | MEDICATED_PAD | Freq: Every day | CUTANEOUS | Status: DC

## 2018-04-05 MED ORDER — METOPROLOL TARTRATE 25 MG PO TABS
25.0000 mg | ORAL_TABLET | Freq: Two times a day (BID) | ORAL | Status: DC
  Administered 2018-04-05: 25 mg via ORAL
  Filled 2018-04-05: qty 1

## 2018-04-05 MED ORDER — METOPROLOL TARTRATE 50 MG PO TABS
50.0000 mg | ORAL_TABLET | Freq: Two times a day (BID) | ORAL | Status: DC
  Administered 2018-04-05 – 2018-04-14 (×15): 50 mg via ORAL
  Filled 2018-04-05 (×18): qty 1

## 2018-04-05 MED ORDER — WARFARIN - PHARMACIST DOSING INPATIENT: Freq: Every day | Status: DC

## 2018-04-05 MED ORDER — MIDODRINE HCL 5 MG PO TABS
5.0000 mg | ORAL_TABLET | Freq: Two times a day (BID) | ORAL | Status: DC
  Administered 2018-04-05 – 2018-04-06 (×2): 5 mg via ORAL
  Filled 2018-04-05 (×4): qty 1

## 2018-04-05 NOTE — Progress Notes (Signed)
Inpatient Rehabilitation-Admissions Coordinator   Spoke with pt at the bedside. AC has received insurance approval for CIR, however the pt now does not feel he can tolerate the intensity of the program. Also noted pt may be going for LLE amputation in the near future. Hopefully pt's tolerance will improve after intervention and enable him to participate fully for CIR.   AC will have to close current insurance case but will continue to follow. If pt shows an increase in tolerance and shows interest in CIR, [facility] will revisit this as an option.   Please call if questions.   Jhonnie Garner, OTR/L  Rehab Admissions Coordinator  [phone] 04/05/2018 2:17 PM

## 2018-04-05 NOTE — Progress Notes (Signed)
PROGRESS NOTE    Albert [name]  LJQ:[id number] DOB: 11-07-1955 DOA: 03/20/2018 PCP: Redmond School, MD   Brief Narrative: 63 year old male with history of chronic A. fib on Coumadin, diabetes mellitus type 2, ESRD on HD TTS  with left lower extremity cellulitis, PVD. Patient was admitted at [facility] on 03/20/2018 with cellulitis of the lower extremity after presenting from home with a left heel discomfort.  ABI was performed that showed compromised blood flow.  X-ray of the heel did not show osteomyelitis.  Patient was transferred to [facility] is going for arteriogram after INR was acceptable.  Underwent arteriogram on 2/3 and on 2/5 had left transmetatarsal of 2nd toe and transmetatarsal amputation of 1 2 and 3 right toes.  Patient has been in A. fib intermittently hypotensive and rate poorly controlled.  Cardiology consulted 2/12.  Subjective: Resting. On RA. No CP,SOB, palpitation. No fever,  Acute events noted overnight w RVR and hypotension in 80s. HR at rest in 110-120s this am  Assessment & Plan:   Cellulitis of left lower extremity/Bilateral dry gangrenous toes/severe bilateral peripheral vascular disease: Status post left second toe transmetatarsal amputation and right 1 2 and 3 transmetatarsal complication.  Vascular surgery following closely, patient is on aspirin, Coumadin, statin.  INR is supratherapeutic.  Informed by the vascular surgery today patient will need amputation sooner and advised to hold the Coumadin , monitor INR and it will need to be at least 1.8 or below per vascular.Patient completed Ancef 2/9.  Essential hypertension, benign : Blood pressure SOFT.  Monitor.  On Cardizem and Lopressor for A. fib rate control.  Chronic Atrial fibrillation:Rate poorly controlled, blood pressure soft, increase metoprolol 25 twice daily (Coreg has been discontinued and switched to metoprolol earlier).  Continue Cardizem.  Consulted cardiology.  Holding Coumadin, monitor  INR and may need reversal if vascular desires to have amputation sooner.  Will await for cardiology recommendation.  ESRD on HD: Nephrology on board, appreciate input. Last HD 2/11.  Patient has poor intake, no signs of volume overload. blood pressure has been soft and tachycardic mostly around dialysis  Supratherapeutic INR : hold coumadin  Anemia of chronic renal disease, continue with ESA, hemoglobin is stable.   Diabetes mellitus : Non-insulin-dependent.  Home tradjenta and glipizide on hold, continue sliding scale scale insulin for now   DVT prophylaxis: coumadin Code Status: full Family Communication: none at bedside  Disposition Plan: Remains inpatient pending clinical improvement.  Per vascular surgery will need amputation soon.  Patient will need CIR placement eventually.   Consultants:   Procedures:  HD: LAST 2/11  03/27/18 Arteriogram with balloon angioplasty of the left posterior tibial artery Aorta and iliac segments are heavily calcified and tortuous however there is no flow-limiting stenosis.  SFAs are heavily calcified no flow-limiting stenosis.  Right side appears to have runoff via the posterior tibial and anterior tibial arteries without flow-limiting stenosis.  Left side has runoff via the anterior tibial and peroneal posterior tibial occludes above the ankle.  After intervention there is less than 30% residual stenosis and a strong signal at the ankle.Patient will need amputation of bilateral second toes at least and we will watch the ulceration of his heel on the left but he remains high risk for proximal leg amputations bilaterally.  03/29/18 1. left transmetatarsal amputation second toe. 2. Large amount of necrotic debris and gangrenous tissue toes 1 2 and 3 right foot requiring Transmetatarsal amputation of these rather than isolated second toe amputation.  Antimicrobials:  Anti-infectives (From admission, onward)   Start     Dose/Rate Route Frequency Ordered Stop    03/20/18 2200  ceFAZolin (ANCEF) IVPB 1 g/50 mL premix  Status:  Discontinued     1 g 100 mL/hr over 30 Minutes Intravenous Every 24 hours 03/20/18 2112 04/02/18 0917       Objective: Vitals:   04/04/18 2126 04/05/18 0032 04/05/18 0359 04/05/18 0754  BP: 112/71 115/70 113/72 108/68  Pulse:   (!) 111 61  Resp: (!) 26 (!) 30 (!) 22 (!) 24  Temp:  98.2 F (36.8 C) 98.1 F (36.7 C)   TempSrc:  Oral Oral   SpO2:  99%  99%  Weight:   91.7 kg   Height:        Intake/Output Summary (Last 24 hours) at 04/05/2018 0835 Last data filed at 04/04/2018 2201 Gross per 24 hour  Intake 140 ml  Output 1500 ml  Net -1360 ml   Filed Weights   04/04/18 1328 04/04/18 1752 04/05/18 0359  Weight: 94.1 kg 92.5 kg 91.7 kg   Weight change: -0.2 kg  Body mass index is 25.96 kg/m.  Intake/Output from previous day: 02/11 0701 - 02/12 0700 In: 140 [P.O.:140] Out: 1500  Intake/Output this shift: No intake/output data recorded.  Examination:  General exam: Appears calm and comfortable,Not in distress, older fore the age HEENT:PERRL,Oral mucosa moist, Ear/Nose normal on gross exam Respiratory system: Bilateral equal air entry, normal vesicular breath sounds, no wheezes or crackles  Cardiovascular system: S1 & S2 heard, irregular, tachycardic in 119. No JVD, murmurs. Gastrointestinal system: Abdomen is  soft, non tender, non distended, BS +  Nervous System:Alert and oriented. No focal neurological deficits/moving extremities, sensation intact. Extremities: No edema, no clubbing, right foot -rt 1,2,3 Transmetatarsal amputation  Wound iuntact . Left 2nd toe s/p amputation. Cool BL LE foot. Skin: No rashes, lesions, no icterus MSK: Normal muscle bulk,tone ,power  Medications:  Scheduled Meds: . allopurinol  100 mg Oral Daily  . aspirin EC  81 mg Oral Daily  . Chlorhexidine Gluconate Cloth  6 each Topical Q0600  . cinacalcet  30 mg Oral Q supper  . [START ON 04/08/2018] darbepoetin (ARANESP)  injection - DIALYSIS  100 mcg Intravenous Q Sat-HD  . diltiazem  180 mg Oral Daily  . doxercalciferol  3.5 mcg Intravenous Q T,Th,Sa-HD  . insulin aspart  0-9 Units Subcutaneous TID WC  . metoprolol tartrate  25 mg Oral BID  . pantoprazole  40 mg Oral Daily  . pravastatin  5 mg Oral QPM  . senna-docusate  1 tablet Oral BID  . sevelamer carbonate  1,600 mg Oral With snacks  . sevelamer carbonate  3,200 mg Oral TID WC  . sodium chloride flush  3 mL Intravenous Q12H  . Warfarin - Pharmacist Dosing Inpatient   Does not apply q1800   Continuous Infusions: . sodium chloride    . sodium chloride    . sodium chloride      Data Reviewed: I have personally reviewed following labs and imaging studies  CBC: Recent Labs  Lab 03/30/18 0314 03/31/18 0336 04/01/18 1324 04/04/18 1431 04/05/18 0315  WBC 9.8 10.7* 9.1 14.0* 13.4*  HGB 10.5* 10.1* 9.2* 10.1* 10.1*  HCT 33.3* 32.5* 29.2* 31.6* 33.0*  MCV 89.5 89.5 88.0 87.5 87.5  PLT 217 219 210 252 967   Basic Metabolic Panel: Recent Labs  Lab 03/30/18 0314 03/31/18 0336 04/01/18 1302 04/04/18 0401 04/04/18 1429 04/05/18 0315  NA 135 136  133*  --  133* 135  K 4.6 4.5 4.2  --  4.1 3.7  CL 89* 89* 92*  --  95* 95*  CO2 25 27 24   --  22 27  GLUCOSE 163* 148* 145*  --  129* 146*  BUN 48* 28* 54*  --  74* 31*  CREATININE 8.45* 6.10* 8.51*  --  10.34* 6.13*  CALCIUM 8.7* 8.9 8.7*  --  8.7* 8.5*  MG  --   --   --  1.9  --   --   PHOS  --   --  5.2*  --  4.2 4.1   GFR: Estimated Creatinine Clearance: 14.2 mL/min (A) (by C-G formula based on SCr of 6.13 mg/dL (H)). Liver Function Tests: Recent Labs  Lab 04/01/18 1302 04/04/18 1429 04/05/18 0315  ALBUMIN 1.8* 1.7* 1.8*   No results for input(s): LIPASE, AMYLASE in the last 168 hours. No results for input(s): AMMONIA in the last 168 hours. Coagulation Profile: Recent Labs  Lab 04/01/18 0414 04/02/18 0342 04/03/18 0344 04/04/18 0401 04/05/18 0315  INR 3.27 3.58 3.54 3.73  3.87   Cardiac Enzymes: No results for input(s): CKTOTAL, CKMB, CKMBINDEX, TROPONINI in the last 168 hours. BNP (last 3 results) No results for input(s): PROBNP in the last 8760 hours. HbA1C: No results for input(s): HGBA1C in the last 72 hours. CBG: Recent Labs  Lab 04/04/18 0604 04/04/18 1137 04/04/18 1820 04/04/18 2114 04/05/18 0630  GLUCAP 134* 153* 107* 187* 156*   Lipid Profile: No results for input(s): CHOL, HDL, LDLCALC, TRIG, CHOLHDL, LDLDIRECT in the last 72 hours. Thyroid Function Tests: Recent Labs    04/04/18 0401  TSH 3.182   Anemia Panel: No results for input(s): VITAMINB12, FOLATE, FERRITIN, TIBC, IRON, RETICCTPCT in the last 72 hours. Sepsis Labs: No results for input(s): PROCALCITON, LATICACIDVEN in the last 168 hours.  Recent Results (from the past 240 hour(s))  Surgical pcr screen     Status: None   Collection Time: 03/27/18  5:23 AM  Result Value Ref Range Status   MRSA, PCR NEGATIVE NEGATIVE Final   Staphylococcus aureus NEGATIVE NEGATIVE Final    Comment: (NOTE) The Xpert SA Assay (FDA approved for NASAL specimens in patients 46 years of age and older), is one component of a comprehensive surveillance program. It is not intended to diagnose infection nor to guide or monitor treatment. Performed at [facility], [place] [address], [place], [place] [address]       Radiology Studies: No results found.    LOS: 16 days   Time spent: More than 50% of that time was spent in counseling and/or coordination of care.  Antonieta Pert, MD [facility]  04/05/2018, 8:35 AM

## 2018-04-05 NOTE — Progress Notes (Signed)
   I have seen and evaluated patient appears that left heel ulceration is worsening although right partial transmetatarsal amputation does appear to be healing.  I discussed that he will likely need left below-knee versus above-knee amputation he demonstrates good understanding.  Unfortunately his INR is now greater than 3.8.  I will discuss with primary team timing for amputation of the left lower extremity.  Brandon C. Donzetta Matters, MD [facility] [place] Office: [phone] Pager: [phone]

## 2018-04-05 NOTE — Progress Notes (Signed)
Occupational Therapy Treatment Patient Details Name: SABRINA ARRIAGA MRN: [id number] DOB: Feb 02, 1956 Today's Date: 04/05/2018    History of present illness Pt is a 63 y.o. M with significant PMH of ESRD, PVD, who presents s/p right transmetarsal amputation and left amputation of 2nd toe. Of note, pt also has heel ulcer that is superficial over the Achilles.   OT comments  Pt with difficulty for sit- stand from EOB to RW and for attempted transfers. Attempted SPT and x 3 and pt unable bear weight on L foot due to increased pain or to advance or slide R foot to pivot to recliner even with +2 assist from OT and RN. Pt with increasing HR to 140s during sit - stand attempts. Pt seemed to become frustrated and stated that he didn't see why he has to get OOB before going to HD (referring to the day before). OT reiterated to pt that is is ok to get OOB for activity before HD and that it's beneficial to participate in therapy every opportunity he has especially since he is wanting to go to CIR. OT will continue to follow acutely   Follow Up Recommendations  CIR    Equipment Recommendations  Other (comment)(TBD at next venue of care)    Recommendations for Other Services      Precautions / Restrictions Precautions Precautions: Fall Required Braces or Orthoses: Other Brace Other Brace: right darco, left post op shoe Restrictions Weight Bearing Restrictions: Yes RLE Weight Bearing: Partial weight bearing RLE Partial Weight Bearing Percentage or Pounds: heel weightbearing with Darco shoe LLE Weight Bearing: Weight bearing as tolerated       Mobility Bed Mobility Overal bed mobility: Needs Assistance Bed Mobility: Supine to Sit;Sit to Supine     Supine to sit: Min assist Sit to supine: Min assist   General bed mobility comments: assist for LEs and trunk  Transfers Overall transfer level: Needs assistance Equipment used: Rolling walker (2 wheeled) Transfers: Sit to/from Stand Sit  to Stand: Max assist;+2 physical assistance         General transfer comment: attempted SPT and x 3 and pt unable to advance or slide R foot to pivot to recliner even with +2 assist from OT and RN    Balance Overall balance assessment: Needs assistance Sitting-balance support: Feet supported Sitting balance-Leahy Scale: Good                                     ADL either performed or assessed with clinical judgement   ADL Overall ADL's : Needs assistance/impaired             Lower Body Bathing: Sitting/lateral leans;Moderate assistance                         General ADL Comments: unable to squat or stand pivot transfer to recliner due to increased pain in L foot with mobility     Vision Patient Visual Report: No change from baseline     Perception     Praxis      Cognition Arousal/Alertness: Awake/alert Behavior During Therapy: WFL for tasks assessed/performed Overall Cognitive Status: No family/caregiver present to determine baseline cognitive functioning                                 General Comments: pt requires  increased time to follow directions        Exercises     Shoulder Instructions       General Comments      Pertinent Vitals/ Pain       Pain Assessment: 0-10 Pain Score: 5  Pain Location: reports 5/10 pain in L foot before activity, 8/10 during sit - stand Pain Descriptors / Indicators: Grimacing;Guarding;Sore;Throbbing Pain Intervention(s): Limited activity within patient's tolerance;Monitored during session;Patient requesting pain meds-RN notified;RN gave pain meds during session;Repositioned  Home Living                                          Prior Functioning/Environment              Frequency  Min 2X/week        Progress Toward Goals  OT Goals(current goals can now be found in the care plan section)  Progress towards OT goals: OT to reassess next treatment      Plan Discharge plan remains appropriate    Co-evaluation                 AM-PAC OT "6 Clicks" Daily Activity     Outcome Measure   Help from another person eating meals?: None Help from another person taking care of personal grooming?: None Help from another person toileting, which includes using toliet, bedpan, or urinal?: Total Help from another person bathing (including washing, rinsing, drying)?: A Lot Help from another person to put on and taking off regular upper body clothing?: None Help from another person to put on and taking off regular lower body clothing?: Total 6 Click Score: 16    End of Session Equipment Utilized During Treatment: Gait belt;Rolling walker  OT Visit Diagnosis: Unsteadiness on feet (R26.81);Other abnormalities of gait and mobility (R26.89);Muscle weakness (generalized) (M62.81);Pain Pain - Right/Left: Left Pain - part of body: Ankle and joints of foot   Activity Tolerance Patient limited by pain   Patient Left with call bell/phone within reach;in bed   Nurse Communication          Time: 1610-9604 OT Time Calculation (min): 44 min  Charges: OT General Charges $OT Visit: 1 Visit OT Treatments $Self Care/Home Management : 8-22 mins $Therapeutic Activity: 23-37 mins    [name] 04/05/2018, 12:44 PM

## 2018-04-05 NOTE — Consult Note (Signed)
Cardiology Consult    Patient ID: Albert [name] MRN: [id number], DOB/AGE: May 16, 1955   Admit date: 03/20/2018 Date of Consult: 04/05/2018  Primary Physician: Redmond School, MD Primary Cardiologist: No primary care provider on file. Requesting Provider: Antonieta Pert, MD.  Patient Profile    Albert [name] is a 63 y.o. male with a history of atrial fibrillation on Coumadin, nonischemic cardiomyopathy with EF of 50-55% on Echo in 11/2016, ESRD on hemodialysis (Tues, Thurs, Sat), hypertension, hyperlipidemia, type 2 diabetes mellitus, and peripheral vascular disease who was admitted to [facility] for cellulitis of left lower extremity. Patient also found to have necrotic/ischemic appearing toes on right foot. Thus, patient was transferred to [facility] for further evaluation and management. Cardiology was consulted today for the evaluation of atrial fibrillation at the request of Dr. Lupita Leash.  History of Present Illness    [name] is a 63 year old male with the above history who is followed by Dr. Domenic Polite. Patient last saw Dr. Domenic Polite in 11/2016 for follow-up at which time he denied any chest pain or progressive palpitations but continued to have low blood pressures with dialysis despite holding Coreg and Diltiazem on those days. Dr. Domenic Polite suggested considering Midodrine at that time rather than cutting back on his heart rate control regimen.   Patient presented to [facility] ED on 03/20/2018 for evaluation of left leg pain.  She was found to have cellulitis of the left lower extremity as well as necrotic/ischemic appearing toes on the right foot. Thus, patient was transferred to [facility] for further evaluation and management of his peripheral vascular diease. He was seen by Dr. Donzetta Matters who performed peripheral vascular balloon angioplasty on the left posterior tibial artery. Patient then underwent transmetatarsal amputation of toes 1, 2, and 3 on the right foot and left 2nd toe  amputation on 03/29/2018. Current plan is for a left below-knee vs above-knee amputation when INR gets to 1.8 or below.  Per chart review, patient has had atrial fibrillation with RVR with hypotension requiring fluid bolus on 04/03/2018. Patient's Coreg was discontinued and Lopressor was added for rate control. However, patient continues to have resting tachycardia. Systolic BP dropped to the 80's last night and then resolved on its own. Cardiology was consulted for assistance in managing his atrial fibrillation.   Patient reports palpitations, lightheadedness, and dizziness when his heart rate spikes. He also notes some shortness of breath with minimal activity recently. He denies any chest pain, orthopnea, PND, lower extremity edema, or presyncope/syncope. At the time of this evaluation, patient's only complaint is left foot pain that he ranks as a 5/10 but can reach a 8/10 on the pain scale.   Patient is a former smoker but quit about 20 years ago. He denies any other alcohol or drug use. Patient's reports one sister has a history of heart disease but he is not sure exactly what she has. He also reports hypertension and diabetes runs in his "whole family."   Past Medical History   Past Medical History:  Diagnosis Date  . Anemia   . Atrial fibrillation ([place])   . Cardiomyopathy    LVEF 50-55% 8/11 - [facility]  . Colonic polyp   . Diverticulosis of colon   . ESRD on hemodialysis ([place])    Dr. Lowanda Foster [facility]  . Essential hypertension, benign   . Gout   . Mixed hyperlipidemia   . Neuropathy    in both feet  . OSA (obstructive sleep apnea)  CPAP  . Renal cell cancer ([place])   . Type 2 diabetes mellitus ([place])    Type II    Past Surgical History:  Procedure Laterality Date  . ABDOMINAL AORTOGRAM W/LOWER EXTREMITY N/A 03/27/2018   Procedure: ABDOMINAL AORTOGRAM W/LOWER EXTREMITY;  Surgeon: Waynetta Sandy, MD;  Location: [place] [facility];  Service: Cardiovascular;  Laterality: N/A;  .  AMPUTATION Bilateral 03/29/2018   Procedure: RESECTION TRANSMETATARSAL - LEFT SECOND TOE OF LEFT FOOT TOE RESECTION TRANSMETATARSAL FIRST ,SECOND,THIRD TOE RIGHT FOOT REMOVAL SECOND TOE GREAT TOE ,THIRD TOE;  Surgeon: Elam Dutch, MD;  Location: [place] [facility];  Service: Vascular;  Laterality: Bilateral;  . BIOPSY  01/05/2017   Procedure: BIOPSY;  Surgeon: Daneil Dolin, MD;  [facility];  Service: Endoscopy;;  gastric  . COLONOSCOPY N/A 01/07/2016   Dr. [name]: Grade 1 internal hemorrhoids, otherwise normal  . COLONOSCOPY W/ POLYPECTOMY  2009, 2012   2009: Dr. [name]: 1.25 cm adenomatous polyp without high grade dysplasia, distal to IC valve. 2012: normal rectum, few pancolonic diverticula, single diminutive hyperplastic polyp  . ESOPHAGOGASTRODUODENOSCOPY  2009   Dr. [name]: accentuated undulating Z-lin with couple of islands of salmon-colored epithelium suspicious for Barrett's, s/p biopsy with path revealing mild inflammation, no metaplasia, dysplasia, or malignancy  . ESOPHAGOGASTRODUODENOSCOPY N/A 01/05/2017   Procedure: ESOPHAGOGASTRODUODENOSCOPY (EGD);  Surgeon: Daneil Dolin, MD;  [facility];  Service: Endoscopy;  Laterality: N/A;  . EUS N/A 04/21/2017   Procedure: UPPER ENDOSCOPIC ULTRASOUND (EUS) LINEAR;  Surgeon: Milus Banister, MD;  Location: [facility];  Service: Endoscopy;  Laterality: N/A;  . FISTULA SUPERFICIALIZATION Left 03/31/2015   Procedure: CEPHALIC VEIN TURNDOWN; PLICATION OF BRACHIOCEPHALIC AVF;  Surgeon: Conrad Edom, MD;  Location: [place];  Service: Vascular;  Laterality: Left;  . FISTULOGRAM N/A 10/19/2013   Procedure: FISTULOGRAM;  Surgeon: Rosetta Posner, MD;  Location: [facility];  Service: Cardiovascular;  Laterality: N/A;  . Left arm AV fistula  2009   Dr. [name]  . PERIPHERAL VASCULAR BALLOON ANGIOPLASTY  03/27/2018   Procedure: PERIPHERAL VASCULAR BALLOON ANGIOPLASTY;  Surgeon: Waynetta Sandy, MD;  Location: [place] [facility];   Service: Cardiovascular;;  LT PT  . PERIPHERAL VASCULAR CATHETERIZATION N/A 03/10/2015   Procedure: Fistulagram;  Surgeon: Conrad [place], MD;  Location: [place] [facility];  Service: Cardiovascular;  Laterality: N/A;  . Right nephrectomy       Allergies  Allergies  Allergen Reactions  . Metformin And Related Other (See Comments)    Diarrhea, bloody stool, upset stomach.     Inpatient Medications    . allopurinol  100 mg Oral Daily  . aspirin EC  81 mg Oral Daily  . Chlorhexidine Gluconate Cloth  6 each Topical Q0600  . cinacalcet  30 mg Oral Q supper  . [START ON 04/08/2018] darbepoetin (ARANESP) injection - DIALYSIS  100 mcg Intravenous Q Sat-HD  . diltiazem  180 mg Oral Daily  . doxercalciferol  3.5 mcg Intravenous Q T,Th,Sa-HD  . insulin aspart  0-9 Units Subcutaneous TID WC  . metoprolol tartrate  25 mg Oral BID  . pantoprazole  40 mg Oral Daily  . pravastatin  5 mg Oral QPM  . senna-docusate  1 tablet Oral BID  . sevelamer carbonate  1,600 mg Oral With snacks  . sevelamer carbonate  3,200 mg Oral TID WC  . sodium chloride flush  3 mL Intravenous Q12H  . [START ON 04/06/2018] Warfarin - Pharmacist  Dosing Inpatient   Does not apply q59    Family History    Family History  Problem Relation Age of Onset  . Hypertension Mother   . Hypertension Other        Siblings  . Diabetes type II Other        Siblings  . Colon cancer Neg Hx    He indicated that his mother is deceased. He indicated that his father is deceased. He indicated that the status of his neg hx is unknown. He indicated that the status of his other is unknown.   Social History    Social History   Socioeconomic History  . Marital status: Single    Spouse name: Not on file  . Number of children: Not on file  . Years of education: Not on file  . Highest education level: Not on file  Occupational History  . Not on file  Social Needs  . Financial resource strain: Not on file  . Food insecurity:     Worry: Not on file    Inability: Not on file  . Transportation needs:    Medical: Not on file    Non-medical: Not on file  Tobacco Use  . Smoking status: Former Smoker    Types: Cigarettes    Last attempt to quit: 07/04/1998    Years since quitting: 19.7  . Smokeless tobacco: Never Used  . Tobacco comment: Quit May 2000  Substance and Sexual Activity  . Alcohol use: No    Alcohol/week: 0.0 standard drinks    Comment: Quit 2014; Previously drank about 3 drinks a day  . Drug use: No  . Sexual activity: Not on file  Lifestyle  . Physical activity:    Days per week: Not on file    Minutes per session: Not on file  . Stress: Not on file  Relationships  . Social connections:    Talks on phone: Not on file    Gets together: Not on file    Attends religious service: Not on file    Active member of club or organization: Not on file    Attends meetings of clubs or organizations: Not on file    Relationship status: Not on file  . Intimate partner violence:    Fear of current or ex partner: Not on file    Emotionally abused: Not on file    Physically abused: Not on file    Forced sexual activity: Not on file  Other Topics Concern  . Not on file  Social History Narrative  . Not on file     Review of Systems    Review of Systems  Constitutional: Negative for chills and fever.  HENT: Negative for congestion.   Eyes: Negative for blurred vision and double vision.  Respiratory: Positive for shortness of breath. Negative for cough and hemoptysis.   Cardiovascular: Positive for palpitations and orthopnea. Negative for chest pain, leg swelling and PND.  Gastrointestinal: Negative for blood in stool.  Genitourinary: Negative for hematuria.  Musculoskeletal: Negative for myalgias.       Left foot pain  Neurological: Positive for dizziness. Negative for loss of consciousness.  Psychiatric/Behavioral: Negative for substance abuse (former tobacco use).    Physical Exam    Blood  pressure 103/76, pulse (!) 107, temperature 98 F (36.7 C), temperature source Oral, resp. rate 16, height 6\' 2"  (1.88 m), weight 91.7 kg, SpO2 98 %.  General: 63 y.o. African-American male resting comfortably in no acute distress.  Pleasant and cooperative. HEENT: Normal  Neck: Supple. No carotid bruits or JVD appreciated. Lungs: No increased work of breathing. Clear to auscultation bilaterally. No wheezes, rhonchi, or rales. Heart: Mildly tachycardic with irregularly irregular rhythm. Distinct S1 and S2. No murmurs, gallops, or rubs.  Abdomen: Soft, non-distended, and non-tender to palpation. Bowel sounds present. Extremities: No lower extremity edema. S/p toe amputations bilaterally.  Skin: Warm and dry. Neuro: Alert and oriented x3. No focal deficits. Moves all extremities spontaneously. Psych: Normal affect. Responds appropriately.  Labs    Troponin (Point of Care Test) No results for input(s): TROPIPOC in the last 72 hours. No results for input(s): CKTOTAL, CKMB, TROPONINI in the last 72 hours. Lab Results  Component Value Date   WBC 13.4 (H) 04/05/2018   HGB 10.1 (L) 04/05/2018   HCT 33.0 (L) 04/05/2018   MCV 87.5 04/05/2018   PLT 255 04/05/2018    Recent Labs  Lab 04/05/18 0315  NA 135  K 3.7  CL 95*  CO2 27  BUN 31*  CREATININE 6.13*  CALCIUM 8.5*  GLUCOSE 146*   Lab Results  Component Value Date   CHOL  03/17/2007    117        ATP III CLASSIFICATION:  <200     mg/dL   Desirable  200-239  mg/dL   Borderline High  >=240    mg/dL   High   HDL 30 (L) 03/17/2007   LDLCALC  03/17/2007    72        Total Cholesterol/HDL:CHD Risk Coronary Heart Disease Risk Table                     Men   Women  1/2 Average Risk   3.4   3.3   TRIG 76 03/17/2007   No results found for: [facility]   Radiology Studies    Dg Ankle 2 Views Left  Result Date: 03/20/2018 CLINICAL DATA:  Pain. EXAM: LEFT ANKLE - 2 VIEW COMPARISON:  None. FINDINGS: There is no evidence of fracture,  dislocation, or joint effusion. There is no evidence of arthropathy or other focal bone abnormality. Soft tissues are unremarkable. IMPRESSION: Negative. Electronically Signed   By: Misty Stanley M.D.   On: 03/20/2018 20:14   US Arterial Abi (screening Lower Extremity)  Result Date: 03/21/2018 CLINICAL DATA:  Gangrene in toes bilaterally EXAM: NONINVASIVE PHYSIOLOGIC VASCULAR STUDY OF BILATERAL LOWER EXTREMITIES TECHNIQUE: Evaluation of both lower extremities were performed at rest, including calculation of ankle-brachial indices with single level Doppler, pressure and pulse volume recording. COMPARISON:  None. FINDINGS: Right ABI:  Noncompressible vessels at the ankle. Left ABI:  Noncompressible vessels at the ankle. Right Lower Extremity: Monophasic posterior tibial and dorsalis pedis waveforms. Left Lower Extremity: The posterior tibial Doppler waveform is absent. The dorsalis pedis waveform is monophasic. IMPRESSION: There is severe bilateral lower extremity arterial occlusive disease. Electronically Signed   By: Marybelle Killings M.D.   On: 03/21/2018 16:27   Dg Foot Complete Left  Result Date: 03/20/2018 CLINICAL DATA:  Soft tissue wound at second toe and posterior heel. EXAM: LEFT FOOT - COMPLETE 3+ VIEW COMPARISON:  None. FINDINGS: No evidence for an acute fracture. No subluxation or dislocation. No bony destruction evident in the second toe or calcaneus to suggest overt osteomyelitis. No gas evident within the soft tissues. IMPRESSION: Negative. Electronically Signed   By: Misty Stanley M.D.   On: 03/20/2018 20:13   Dg Foot Complete Right  Result Date: 03/20/2018  CLINICAL DATA:  Left foot pain EXAM: RIGHT FOOT COMPLETE - 3+ VIEW COMPARISON:  None. FINDINGS: Severe diffuse vascular calcifications. Plantar calcaneal spur. No acute bony abnormality. Specifically, no fracture, subluxation, or dislocation. IMPRESSION: No acute bony abnormality. Electronically Signed   By: Rolm Baptise M.D.   On:  03/20/2018 20:13   Vas [place] Lower Extremity Saphenous Vein Mapping  Result Date: 03/24/2018 LOWER EXTREMITY VEIN MAPPING History: History of PAD; patient is pre-operative for lower extremity bypass          graft.  Performing Technologist: June Leap RDMS, RVT  Examination Guidelines: A complete evaluation includes B-mode imaging, spectral Doppler, color Doppler, and power Doppler as needed of all accessible portions of each vessel. Bilateral testing is considered an integral part of a complete examination. Limited examinations for reoccurring indications may be performed as noted. +---------------+-----------+----------------------+---------------+-----------+   RT Diameter  RT Findings         GSV            LT Diameter  LT Findings      (cm)                                            (cm)                  +---------------+-----------+----------------------+---------------+-----------+      0.62                     Saphenofemoral         0.42                                                   [place]                                  +---------------+-----------+----------------------+---------------+-----------+      0.35                     Proximal thigh         0.38                  +---------------+-----------+----------------------+---------------+-----------+      0.30                       Mid thigh            0.35                  +---------------+-----------+----------------------+---------------+-----------+      0.28                      Distal thigh          0.40                  +---------------+-----------+----------------------+---------------+-----------+      0.41       branching          Knee              0.27                  +---------------+-----------+----------------------+---------------+-----------+      0.34  Prox calf            0.28                   +---------------+-----------+----------------------+---------------+-----------+      0.30       branching        Mid calf            0.27       branching  +---------------+-----------+----------------------+---------------+-----------+      0.23                      Distal calf           0.36                  +---------------+-----------+----------------------+---------------+-----------+ Diagnosing physician: Curt Jews MD Electronically signed by Curt Jews MD on 03/24/2018 at 4:53:39 PM.    Final     EKG     EKG: EKG from 04/01/2018 was personally reviewed and demonstrates: Atrial fibrillation with RVR (135 bpm) with mild ST depression and T wave inversion noted in anterolateral leads but no significant changes compared to prior tracings.   Telemetry: Telemetry was personally reviewed and demonstrates: Atrial fibrillation/flutter with heart rates mostly in the 100's to 120's but brief episodes in the 140's to 150's.   Cardiac Imaging   Echocardiogram 11/26/2016: Study Conclusions: - Left ventricle: The cavity size was normal. Wall thickness was   increased in a pattern of mild LVH. Systolic function was normal.   The estimated ejection fraction was in the range of 50% to 55%.   Wall motion was normal; there were no regional wall motion   abnormalities. The study was not technically sufficient to allow   evaluation of LV diastolic dysfunction due to atrial   fibrillation. Indeterminate filling pressures. - Aorta: Mild aortic root dilatation. Diameter 4 cm. - Mitral valve: Calcified annulus. Normal thickness leaflets . - Left atrium: The atrium was moderately dilated. - Right ventricle: The cavity size was mildly dilated. Systolic   function was mildly reduced. - Right atrium: The atrium was mildly dilated. - Tricuspid valve: There was mild regurgitation.  Assessment & Plan    Chronic Atrial Fibrillation with RVR - Telemetry shows atrial fibrillation/flutter with baseline  heart rates in the 100's to 110's with brief episodes in the 140's to 150's. - TSH normal. - Magnesium 1.9. Goal > 2.0. Replete as needed. - Potassium 3.7. Goal > 4.0. Replete as needed. - Consider rechecking Echo. - Patient on Coreg 12.5mg  twice daily and Diltiazem 180mg  daily at home. Patient has been intermittently hypotensive and rate has been poorly controlled her so home Coreg was stopped and Lopressor was started. Lopressor was increased to 25mg  twice daily this morning. Patient also has IV Lopressor 5mg  ordered as needed for sustained heart rates > 120 bpm. - Continue Diltiazem and continue to titrate PO Lopressor as able. - Pain and cellulitis may be contributing to tachycardia.  - Home Coumadin currently being held in anticipation for left below-knee vs above-knee amputation. Per vascular surgery, INR will need to be at least 1.8 or below before they will proceed with surgery.   Hypertension - BP soft but stable at 103/76.  - Continue Diltiazem and Lopressor as above.  ESRD on Hemodialysis (Tues/Thurs/Sat) - Hemoglobin low but stable at 10.1.  - Management per Nephrology and primary team.  Peripheral Vascular Disease - Patient s/p  transmetatarsal amputation of toes 1, 2, and 3 on the right  foot and transmetatarsal left 2nd toe amputation on 03/29/2018. - Planning for left below-knee vs above-knee amputation when INR is 1.8 or below. - Management per Vascular Surgery and primary team.  Otherwise, per primary team.   Signed, Darreld Mclean, PA-C 04/05/2018, 3:03 PM  For questions or updates, please contact   Please consult [email or web address] for contact info under Cardiology/STEMI.

## 2018-04-05 NOTE — Progress Notes (Signed)
Patient B.P. drop 83/56 h.r. 125 At. Fib. And B.P. came back up on it's own. Rapid Resp. Was call to assess patient . [place] N.P. aware and 21:16 B.P. 105/65 h.r. 121. Cont . To monitor patient and V.S.

## 2018-04-05 NOTE — Progress Notes (Signed)
ANTICOAGULATION CONSULT NOTE  Pharmacy Consult:  Coumadin  Indication: atrial fibrillation   Allergies  Allergen Reactions  . Metformin And Related Other (See Comments)    Diarrhea, bloody stool, upset stomach.     Patient Measurements: Height: 6\' 2"  (188 cm) Weight: 202 lb 2.6 oz (91.7 kg) IBW/kg (Calculated) : 82.2  Vital Signs: Temp: 98.1 F (36.7 C) (02/12 0359) Temp Source: Oral (02/12 0359) BP: 108/68 (02/12 0754) Pulse Rate: 61 (02/12 0754)  Labs: Recent Labs    04/03/18 0344 04/04/18 0401 04/04/18 1429 04/04/18 1431 04/05/18 0315  HGB  --   --   --  10.1* 10.1*  HCT  --   --   --  31.6* 33.0*  PLT  --   --   --  252 255  LABPROT 34.9* 36.3*  --   --  37.4*  INR 3.54 3.73  --   --  3.87  CREATININE  --   --  10.34*  --  6.13*    Estimated Creatinine Clearance: 14.2 mL/min (A) (by C-G formula based on SCr of 6.13 mg/dL (H)).   Assessment: 46 YOM with history of Afib on Coumadin PTA.  INR elevated on admit and was reversed with Vitamin K 1mg  on 03/25/18.  Patient presented with left heel pain/gangrenous toes and underwent amputation on 03/29/18.  Pharmacy consulted to resume Coumadin on 03/30/18.    INR still well above goal, trended up again overnight to 3.8. No bleeding issues noted. CBC stable this morning. May need to start to consider boost supplements if able to tolerate to provide slow vitamin k replenishment.   Home Coumadin regimen:  2.5mg  PO daily   Goal of Therapy:  INR 2-3 Monitor platelets by anticoagulation protocol: Yes  Resolution of infection    Plan:  Hold coumadin for now Daily PT / INR  Erin Hearing PharmD., BCPS Clinical Pharmacist 04/05/2018 9:05 AM  **Pharmacist phone directory can now be found on [email or web address] ([email or web address]).  Listed under [place].

## 2018-04-05 NOTE — Progress Notes (Signed)
[facility]    NEPHROLOGY PROGRESS NOTE  SUBJECTIVE:   Seen in room. Denies chest pain, shortness of breath, nausea, vomiting, diarrhea or dysuria.  All other review of systems are negative.  Dialysis yesterday - says went fine.    OBJECTIVE:  Vitals:   04/05/18 0359 04/05/18 0754  BP: 113/72 108/68  Pulse: (!) 111 61  Resp: (!) 22 (!) 24  Temp: 98.1 F (36.7 C)   SpO2:  99%    Intake/Output Summary (Last 24 hours) at 04/05/2018 1028 Last data filed at 04/05/2018 0900 Gross per 24 hour  Intake 300 ml  Output 1500 ml  Net -1200 ml      Genearl:  AAOx3 NAD HEENT: MMM [place] AT anicteric sclera CV:  Heart RRR  Lungs:  L/S CTA bilaterally Abd:  abd SNT/ND with normal BS Extremities:  No LE edema.  Left upper extremity AV fistula with a good thrill and bruit. Skin:  No skin rash Neuro; Conversant  MEDICATIONS:  . allopurinol  100 mg Oral Daily  . aspirin EC  81 mg Oral Daily  . Chlorhexidine Gluconate Cloth  6 each Topical Q0600  . cinacalcet  30 mg Oral Q supper  . [START ON 04/08/2018] darbepoetin (ARANESP) injection - DIALYSIS  100 mcg Intravenous Q Sat-HD  . diltiazem  180 mg Oral Daily  . doxercalciferol  3.5 mcg Intravenous Q T,Th,Sa-HD  . insulin aspart  0-9 Units Subcutaneous TID WC  . metoprolol tartrate  25 mg Oral BID  . pantoprazole  40 mg Oral Daily  . pravastatin  5 mg Oral QPM  . senna-docusate  1 tablet Oral BID  . sevelamer carbonate  1,600 mg Oral With snacks  . sevelamer carbonate  3,200 mg Oral TID WC  . sodium chloride flush  3 mL Intravenous Q12H  . Warfarin - Pharmacist Dosing Inpatient   Does not apply q1800    LABS:   CBC Latest Ref Rng & Units 04/05/2018 04/04/2018 04/01/2018  WBC 4.0 - 10.5 K/uL 13.4(H) 14.0(H) 9.1  Hemoglobin 13.0 - 17.0 g/dL 10.1(L) 10.1(L) 9.2(L)  Hematocrit 39.0 - 52.0 % 33.0(L) 31.6(L) 29.2(L)  Platelets 150 - 400 K/uL 255 252 210    CMP Latest Ref Rng & Units 04/05/2018 04/04/2018 04/01/2018  Glucose 70 -  99 mg/dL 146(H) 129(H) 145(H)  BUN 8 - 23 mg/dL 31(H) 74(H) 54(H)  Creatinine 0.61 - 1.24 mg/dL 6.13(H) 10.34(H) 8.51(H)  Sodium 135 - 145 mmol/L 135 133(L) 133(L)  Potassium 3.5 - 5.1 mmol/L 3.7 4.1 4.2  Chloride 98 - 111 mmol/L 95(L) 95(L) 92(L)  CO2 22 - 32 mmol/L 27 22 24   Calcium 8.9 - 10.3 mg/dL 8.5(L) 8.7(L) 8.7(L)  Total Protein 6.5 - 8.1 g/dL - - -  Total Bilirubin 0.3 - 1.2 mg/dL - - -  Alkaline Phos 38 - 126 U/L - - -  AST 15 - 41 U/L - - -  ALT 17 - 63 U/L - - -    Lab Results  Component Value Date   PTH 350.0 (H) 03/21/2007   CALCIUM 8.5 (L) 04/05/2018   CAION 0.95 (L) 01/18/2017   PHOS 4.1 04/05/2018       Component Value Date/Time   COLORURINE YELLOW 10/12/2013 1045   APPEARANCEUR CLEAR 10/12/2013 1045   LABSPEC 1.015 10/12/2013 1045   PHURINE 8.0 10/12/2013 1045   GLUCOSEU NEGATIVE 10/12/2013 1045   HGBUR MODERATE (A) 10/12/2013 1045   BILIRUBINUR NEGATIVE 10/12/2013 [place] 10/12/2013 1045  PROTEINUR 30 (A) 10/12/2013 1045   UROBILINOGEN 1.0 10/12/2013 1045   NITRITE NEGATIVE 10/12/2013 1045   LEUKOCYTESUR NEGATIVE 10/12/2013 1045      Component Value Date/Time   PHART 7.405 10/12/2013 0352   PCO2ART 32.2 (L) 10/12/2013 0352   PO2ART 111.0 (H) 10/12/2013 0352   HCO3 20.4 10/12/2013 0352   TCO2 28 01/18/2017 1346   ACIDBASEDEF 3.9 (H) 10/12/2013 0352   O2SAT 99.1 10/12/2013 0352       Component Value Date/Time   IRON 46 03/16/2007 0610   TIBC 358 03/16/2007 0610   FERRITIN 237 03/16/2007 0610   IRONPCTSAT 13 (L) 03/16/2007 0610    Hemodialysis prescription: TTS, 4 hours 15 minutes, [facility] [place].  Dry weight 97 kg.  Left brachiocephalic fistula.   ASSESSMENT/PLAN:     1. Bilateral arterial insufficiency- with cellulitis and gangrenous changes.  On abx, S/p angio, s/p TMTs on both sides  2/5.  Per VVS note 2/11 - f/u 3 wks for wound check, at risk for BL AKA.  He tells me today that he may need further amputation  sooner. 2. ESRD continue with HD qTTS Dr.  Lowanda Foster, on HD- doing well, next HD planned for tomorrow 3. Anemia: cont with ESA.  Hemoglobin at goal, cont ESA. 4. CKD-MBD: continue with binders and vit D 5. Nutrition: renal diet 6. Hypertension: stable  7. DM- stable 8. Afib: on cardizem and metoprolol, Coumadin per pharmacy  Jannifer Hick MD

## 2018-04-05 NOTE — Plan of Care (Signed)
  Problem: Health Behavior/Discharge Planning: Goal: Ability to manage health-related needs will improve Outcome: Progressing   Problem: Clinical Measurements: Goal: Ability to maintain clinical measurements within normal limits will improve Outcome: Progressing   

## 2018-04-06 DIAGNOSIS — I953 Hypotension of hemodialysis: Secondary | ICD-10-CM

## 2018-04-06 LAB — BASIC METABOLIC PANEL
Anion gap: 19 — ABNORMAL HIGH (ref 5–15)
BUN: 49 mg/dL — AB (ref 8–23)
CO2: 23 mmol/L (ref 22–32)
Calcium: 8.5 mg/dL — ABNORMAL LOW (ref 8.9–10.3)
Chloride: 93 mmol/L — ABNORMAL LOW (ref 98–111)
Creatinine, Ser: 8.53 mg/dL — ABNORMAL HIGH (ref 0.61–1.24)
GFR calc Af Amer: 7 mL/min — ABNORMAL LOW (ref >60)
GFR calc non Af Amer: 6 mL/min — ABNORMAL LOW (ref >60)
Glucose, Bld: 147 mg/dL — ABNORMAL HIGH (ref 70–99)
Potassium: 3.8 mmol/L (ref 3.5–5.1)
Sodium: 135 mmol/L (ref 135–145)

## 2018-04-06 LAB — CBC
HEMATOCRIT: 33.4 % — AB (ref 39.0–52.0)
Hemoglobin: 10.3 g/dL — ABNORMAL LOW (ref 13.0–17.0)
MCH: 27.3 pg (ref 26.0–34.0)
MCHC: 30.8 g/dL (ref 30.0–36.0)
MCV: 88.6 fL (ref 80.0–100.0)
Platelets: 236 10*3/uL (ref 150–400)
RBC: 3.77 MIL/uL — ABNORMAL LOW (ref 4.22–5.81)
RDW: 15 % (ref 11.5–15.5)
WBC: 9.6 10*3/uL (ref 4.0–10.5)
nRBC: 0 % (ref 0.0–0.2)

## 2018-04-06 LAB — TYPE AND SCREEN
ABO/RH(D): O POS
Antibody Screen: NEGATIVE

## 2018-04-06 LAB — PROTIME-INR
INR: 3.1
Prothrombin Time: 31.5 seconds — ABNORMAL HIGH (ref 11.4–15.2)

## 2018-04-06 LAB — GLUCOSE, CAPILLARY
GLUCOSE-CAPILLARY: 132 mg/dL — AB (ref 70–99)
Glucose-Capillary: 146 mg/dL — ABNORMAL HIGH (ref 70–99)
Glucose-Capillary: 169 mg/dL — ABNORMAL HIGH (ref 70–99)
Glucose-Capillary: 133 mg/dL — ABNORMAL HIGH (ref 70–99)

## 2018-04-06 MED ORDER — ALTEPLASE 2 MG IJ SOLR: 2.0000 mg | Freq: Once | INTRAMUSCULAR | Status: DC | PRN

## 2018-04-06 MED ORDER — PHYTONADIONE 5 MG PO TABS
5.0000 mg | ORAL_TABLET | Freq: Once | ORAL | Status: AC
  Administered 2018-04-06: 5 mg via ORAL
  Filled 2018-04-06: qty 1

## 2018-04-06 MED ORDER — MIDODRINE HCL 5 MG PO TABS
10.0000 mg | ORAL_TABLET | Freq: Three times a day (TID) | ORAL | Status: DC
  Administered 2018-04-06 – 2018-04-14 (×22): 10 mg via ORAL
  Filled 2018-04-06 (×25): qty 2

## 2018-04-06 MED ORDER — CEFAZOLIN SODIUM-DEXTROSE 2-4 GM/100ML-% IV SOLN
2.0000 g | INTRAVENOUS | Status: AC
  Filled 2018-04-06: qty 100

## 2018-04-06 MED ORDER — SODIUM CHLORIDE 0.9 % IV SOLN: 100.0000 mL | INTRAVENOUS | Status: DC | PRN

## 2018-04-06 MED ORDER — DOXERCALCIFEROL 4 MCG/2ML IV SOLN
INTRAVENOUS | Status: AC
  Filled 2018-04-06: qty 2

## 2018-04-06 NOTE — Progress Notes (Signed)
Progress Note  Patient Name: Albert [name] Date of Encounter: 04/06/2018  Primary Cardiologist: Rozann Lesches, MD   Subjective   No significant overnight events. Patient appears tired and states he does not feel well after just getting done with dialysis. No chest pain, shortness of breath, or palpitations.  Inpatient Medications    Scheduled Meds: . allopurinol  100 mg Oral Daily  . aspirin EC  81 mg Oral Daily  . Chlorhexidine Gluconate Cloth  6 each Topical Q0600  . cinacalcet  30 mg Oral Q supper  . [START ON 04/08/2018] darbepoetin (ARANESP) injection - DIALYSIS  100 mcg Intravenous Q Sat-HD  . diltiazem  180 mg Oral Daily  . doxercalciferol  3.5 mcg Intravenous Q T,Th,Sa-HD  . insulin aspart  0-9 Units Subcutaneous TID WC  . metoprolol tartrate  50 mg Oral BID  . midodrine  5 mg Oral BID WC  . pantoprazole  40 mg Oral Daily  . phytonadione  5 mg Oral Once  . pravastatin  5 mg Oral QPM  . senna-docusate  1 tablet Oral BID  . sevelamer carbonate  1,600 mg Oral With snacks  . sevelamer carbonate  3,200 mg Oral TID WC  . sodium chloride flush  3 mL Intravenous Q12H  . Warfarin - Pharmacist Dosing Inpatient   Does not apply q1800   Continuous Infusions: . sodium chloride     PRN Meds: sodium chloride, acetaminophen, hydrALAZINE, HYDROcodone-acetaminophen, labetalol, ondansetron (ZOFRAN) IV, oxyCODONE, phenol, sodium chloride flush   Vital Signs    Vitals:   04/06/18 1100 04/06/18 1128 04/06/18 1251 04/06/18 1300  BP: 123/69 119/73 (!) 86/56   Pulse: 90 (!) 113 (!) 110   Resp:  18 19 (!) 30  Temp:  97.8 F (36.6 C) 98.1 F (36.7 C)   TempSrc:  Oral Oral   SpO2:  97% 98%   Weight:  94 kg    Height:        Intake/Output Summary (Last 24 hours) at 04/06/2018 1336 Last data filed at 04/06/2018 1128 Gross per 24 hour  Intake 240 ml  Output 500 ml  Net -260 ml   Last 3 Weights 04/06/2018 04/06/2018 04/06/2018  Weight (lbs) 207 lb 3.7 oz 208 lb 1.8 oz 208  lb 5.4 oz  Weight (kg) 94 kg 94.4 kg 94.5 kg      Telemetry    Atrial fibrillation with ventricular rates in the 110's to 120's following dialysis. - Personally Reviewed  ECG    No new ECG tracing today. - Personally Reviewed  Physical Exam   General: 63 y.o. African-American male resting comfortably in no acute distress. Pleasant and cooperative.        Neck: Supple.  Lungs: No increased work of breathing. Clear to auscultation bilaterally. No wheezes, rhonchi, or rales. Heart: Tachycardic with irregularly irregular rhythm. No murmurs, gallops, or rubs.  Abdomen: Soft, non-distended, and non-tender to palpation. Bowel sounds present. Extremities: No lower extremity edema. S/p toe amputations bilaterally.  Skin: Warm and dry. Neuro: No focal deficits.  Psych: Normal affect. Responds appropriately.  Labs    Chemistry Recent Labs  Lab 04/01/18 1302 04/04/18 1429 04/05/18 0315 04/06/18 0256  NA 133* 133* 135 135  K 4.2 4.1 3.7 3.8  CL 92* 95* 95* 93*  CO2 24 22 27 23   GLUCOSE 145* 129* 146* 147*  BUN 54* 74* 31* 49*  CREATININE 8.51* 10.34* 6.13* 8.53*  CALCIUM 8.7* 8.7* 8.5* 8.5*  ALBUMIN 1.8* 1.7* 1.8*  --  GFRNONAA 6* 5* 9* 6*  GFRAA 7* 5* 10* 7*  ANIONGAP 17* 16* 13 19*     Hematology Recent Labs  Lab 04/04/18 1431 04/05/18 0315 04/06/18 0256  WBC 14.0* 13.4* 9.6  RBC 3.61* 3.77* 3.77*  HGB 10.1* 10.1* 10.3*  HCT 31.6* 33.0* 33.4*  MCV 87.5 87.5 88.6  MCH 28.0 26.8 27.3  MCHC 32.0 30.6 30.8  RDW 14.9 14.9 15.0  PLT 252 255 236    Cardiac EnzymesNo results for input(s): TROPONINI in the last 168 hours. No results for input(s): TROPIPOC in the last 168 hours.   BNPNo results for input(s): BNP, PROBNP in the last 168 hours.   DDimer No results for input(s): DDIMER in the last 168 hours.   Radiology    No results found.  Cardiac Studies   Echocardiogram 11/2016: Study Conclusions: - Left ventricle: The cavity size was normal. Wall thickness  was   increased in a pattern of mild LVH. Systolic function was normal.   The estimated ejection fraction was in the range of 50% to 55%.   Wall motion was normal; there were no regional wall motion   abnormalities. The study was not technically sufficient to allow   evaluation of LV diastolic dysfunction due to atrial   fibrillation. Indeterminate filling pressures. - Aorta: Mild aortic root dilatation. Diameter 4 cm. - Mitral valve: Calcified annulus. Normal thickness leaflets . - Left atrium: The atrium was moderately dilated. - Right ventricle: The cavity size was mildly dilated. Systolic   function was mildly reduced. - Right atrium: The atrium was mildly dilated. - Tricuspid valve: There was mild regurgitation.  Patient Profile     Albert [name] is a 63 y.o. male with a history of atrial fibrillation on Coumadin, nonischemic cardiomyopathy with EF of 50-55% on Echo in 11/2016, ESRD on hemodialysis (Tues, Thurs, Sat), hypertension, hyperlipidemia, type 2 diabetes mellitus, and peripheral vascular disease who was admitted to [facility] for cellulitis of left lower extremity. Patient also found to have necrotic/ischemic appearing toes on right foot. Thus, patient was transferred to [facility] for further evaluation and management. Cardiology was consulted for the evaluation of atrial fibrillation at the request of Dr. Lupita Leash.  Assessment & Plan    Chronic Atrial Fibrillation with RVR - Telemetry shows atrial fibrillation with heart rates in the 110's to 120's following dialysis session today. BP  - TSH normal. - Magnesium 1.9. Goal > 2.0. Replete as needed. - Potassium 3.8. Goal > 4.0. Replete as needed. - Continue Diltiazem 180mg  daily.  - Continue Lopressor 50mg  twice daily. May need to increase to 75mg  twice daily if BP will allow. - Continue Midodrine to help with hypotension. - Home Coumadin currently being held in anticipation for left below-knee vs above-knee amputation. Per  vascular surgery, INR will need to be at least 1.8 or below before they will proceed with surgery. INR 3.1 today. OK to reverse with Vitamin K if vascular surgery thinks that is needed.  Hypotensive with History of Hypertension - BPs had improved some after starting Midodrine; however, reportedly had a few episodes during dialysis with systolic Bps in the 87'G to 90's. Last BP in chart 86/56. Rechecked BP during exam and it was 103/57. - Continue Diltiazem and Lopressor as above for rate control. - Consider increasing Midodrine to 5mg  three times daily.   ESRD on Hemodialysis (Tues/Thurs/Sat) - Hemoglobin low but stable at 10.3.  - Management per Nephrology and primary team.  Peripheral Vascular Disease -  Patient s/p  transmetatarsal amputation of toes 1, 2, and 3 on the right foot and transmetatarsal left 2nd toe amputation on 03/29/2018. - Planning for left below-knee vs above-knee amputation when INR is 1.8 or below. - Management per Vascular Surgery and primary team.  Otherwise, per primary team.   For questions or updates, please contact [place] Please consult [email or web address] for contact info under        Signed, Darreld Mclean, PA-C  04/06/2018, 1:36 PM

## 2018-04-06 NOTE — Plan of Care (Signed)
  Problem: Health Behavior/Discharge Planning: Goal: Ability to manage health-related needs will improve Outcome: Progressing   

## 2018-04-06 NOTE — Progress Notes (Signed)
   Plan for left lower above-knee amputation tomorrow in the operating room with Dr. Donnetta Hutching.  I discussed with the patient.  INR elevated 3.1 today we will give vitamin K and will transfuse FFP if still elevated in the morning.  There is no family available to discuss with patient but he demonstrates very good understanding and wishes to proceed.  N.p.o. past midnight.  Bayler Gehrig C. Donzetta Matters, MD [facility] Office: [phone] Pager: [phone]

## 2018-04-06 NOTE — Progress Notes (Signed)
PT Cancellation Note  Patient Details Name: Albert [name] MRN: [id number] DOB: 18-Mar-1955   Cancelled Treatment:    Reason Eval/Treat Not Completed: Patient at procedure or test/unavailable (HD).  Ellamae Sia, PT, DPT Acute Rehabilitation Services Pager [phone] Office [phone]    Willy Eddy 04/06/2018, 11:51 AM

## 2018-04-06 NOTE — Progress Notes (Signed)
ANTICOAGULATION CONSULT NOTE  Pharmacy Consult:  Coumadin  Indication: atrial fibrillation   Allergies  Allergen Reactions  . Metformin And Related Other (See Comments)    Diarrhea, bloody stool, upset stomach.     Patient Measurements: Height: 6\' 2"  (188 cm) Weight: 208 lb 1.8 oz (94.4 kg) IBW/kg (Calculated) : 82.2  Vital Signs: Temp: 97.7 F (36.5 C) (02/13 0715) Temp Source: Oral (02/13 0715) BP: 111/72 (02/13 0830) Pulse Rate: 106 (02/13 0830)  Labs: Recent Labs    04/04/18 0401 04/04/18 1429  04/04/18 1431 04/05/18 0315 04/06/18 0256  HGB  --   --    < > 10.1* 10.1* 10.3*  HCT  --   --   --  31.6* 33.0* 33.4*  PLT  --   --   --  252 255 236  LABPROT 36.3*  --   --   --  37.4* 31.5*  INR 3.73  --   --   --  3.87 3.10  CREATININE  --  10.34*  --   --  6.13* 8.53*   < > = values in this interval not displayed.    Estimated Creatinine Clearance: 10.2 mL/min (A) (by C-G formula based on SCr of 8.53 mg/dL (H)).   Assessment: 48 YOM with history of Afib on Coumadin PTA.  INR elevated on admit and was reversed with Vitamin K 1mg  on 03/25/18.  Patient presented with left heel pain/gangrenous toes and underwent amputation on 03/29/18.  Pharmacy consulted to resume Coumadin on 03/30/18.    INR still well above goal, trended up down overnight to 3.1. No bleeding issues noted. CBC stable this morning.   Home Coumadin regimen:  2.5mg  PO daily   Goal of Therapy:  INR 2-3 Monitor platelets by anticoagulation protocol: Yes  Resolution of infection    Plan:  Hold coumadin for now Daily PT / INR   Camila Maita A. Levada Dy, PharmD, [place] Pager: [phone] Please utilize Amion for appropriate phone number to reach the unit pharmacist ([place])

## 2018-04-06 NOTE — Progress Notes (Signed)
[facility]    NEPHROLOGY PROGRESS NOTE  SUBJECTIVE:   Seen on dialysis.   Denies chest pain, shortness of breath, nausea, vomiting, diarrhea or dysuria.  All other review of systems are negative.    Cardiology consult yesterday for a fib with RVR c/b hypotension.  Started midodrine and titrating metoprolol, cont dilt.    OBJECTIVE:  Vitals:   04/06/18 0830 04/06/18 0900  BP: 111/72 95/71  Pulse: (!) 106 (!) 115  Resp:    Temp:    SpO2:      Intake/Output Summary (Last 24 hours) at 04/06/2018 5621 Last data filed at 04/05/2018 1835 Gross per 24 hour  Intake 360 ml  Output 0 ml  Net 360 ml      Genearl:  AAOx3 NAD HEENT: MMM [place] AT anicteric sclera CV:  Irreg, mildly tachy Lungs:  L/S CTA bilaterally Abd:  abd SNT/ND with normal BS Extremities:  No LE edema.  Left upper extremity AV fistula with a good thrill and bruit. Skin:  No skin rash Neuro; Conversant  MEDICATIONS:  . allopurinol  100 mg Oral Daily  . aspirin EC  81 mg Oral Daily  . Chlorhexidine Gluconate Cloth  6 each Topical Q0600  . cinacalcet  30 mg Oral Q supper  . [START ON 04/08/2018] darbepoetin (ARANESP) injection - DIALYSIS  100 mcg Intravenous Q Sat-HD  . diltiazem  180 mg Oral Daily  . doxercalciferol  3.5 mcg Intravenous Q T,Th,Sa-HD  . insulin aspart  0-9 Units Subcutaneous TID WC  . metoprolol tartrate  50 mg Oral BID  . midodrine  5 mg Oral BID WC  . pantoprazole  40 mg Oral Daily  . pravastatin  5 mg Oral QPM  . senna-docusate  1 tablet Oral BID  . sevelamer carbonate  1,600 mg Oral With snacks  . sevelamer carbonate  3,200 mg Oral TID WC  . sodium chloride flush  3 mL Intravenous Q12H  . Warfarin - Pharmacist Dosing Inpatient   Does not apply q1800    LABS:   CBC Latest Ref Rng & Units 04/06/2018 04/05/2018 04/04/2018  WBC 4.0 - 10.5 K/uL 9.6 13.4(H) 14.0(H)  Hemoglobin 13.0 - 17.0 g/dL 10.3(L) 10.1(L) 10.1(L)  Hematocrit 39.0 - 52.0 % 33.4(L) 33.0(L) 31.6(L)  Platelets  150 - 400 K/uL 236 255 252    CMP Latest Ref Rng & Units 04/06/2018 04/05/2018 04/04/2018  Glucose 70 - 99 mg/dL 147(H) 146(H) 129(H)  BUN 8 - 23 mg/dL 49(H) 31(H) 74(H)  Creatinine 0.61 - 1.24 mg/dL 8.53(H) 6.13(H) 10.34(H)  Sodium 135 - 145 mmol/L 135 135 133(L)  Potassium 3.5 - 5.1 mmol/L 3.8 3.7 4.1  Chloride 98 - 111 mmol/L 93(L) 95(L) 95(L)  CO2 22 - 32 mmol/L 23 27 22   Calcium 8.9 - 10.3 mg/dL 8.5(L) 8.5(L) 8.7(L)  Total Protein 6.5 - 8.1 g/dL - - -  Total Bilirubin 0.3 - 1.2 mg/dL - - -  Alkaline Phos 38 - 126 U/L - - -  AST 15 - 41 U/L - - -  ALT 17 - 63 U/L - - -    Lab Results  Component Value Date   PTH 350.0 (H) 03/21/2007   CALCIUM 8.5 (L) 04/06/2018   CAION 0.95 (L) 01/18/2017   PHOS 4.1 04/05/2018       Component Value Date/Time   COLORURINE YELLOW 10/12/2013 1045   APPEARANCEUR CLEAR 10/12/2013 1045   LABSPEC 1.015 10/12/2013 1045   PHURINE 8.0 10/12/2013 1045   [place]  10/12/2013 1045   HGBUR MODERATE (A) 10/12/2013 1045   BILIRUBINUR NEGATIVE 10/12/2013 1045   KETONESUR NEGATIVE 10/12/2013 1045   PROTEINUR 30 (A) 10/12/2013 1045   UROBILINOGEN 1.0 10/12/2013 1045   NITRITE NEGATIVE 10/12/2013 1045   LEUKOCYTESUR NEGATIVE 10/12/2013 1045      Component Value Date/Time   PHART 7.405 10/12/2013 0352   PCO2ART 32.2 (L) 10/12/2013 0352   PO2ART 111.0 (H) 10/12/2013 0352   HCO3 20.4 10/12/2013 0352   TCO2 28 01/18/2017 1346   ACIDBASEDEF 3.9 (H) 10/12/2013 0352   O2SAT 99.1 10/12/2013 0352       Component Value Date/Time   IRON 46 03/16/2007 0610   TIBC 358 03/16/2007 0610   FERRITIN 237 03/16/2007 0610   IRONPCTSAT 13 (L) 03/16/2007 0610    Hemodialysis prescription: TTS, 4 hours 15 minutes, [facility].  Dry weight 97 kg.  Left brachiocephalic fistula.   ASSESSMENT/PLAN:     1. Bilateral arterial insufficiency- with cellulitis and gangrenous changes.  On abx, S/p angio, s/p TMTs on both sides  2/5.  Per VVS note 2/11 - f/u 3 wks  for wound check, at risk for BL AKA.  He may need further amputation sooner.  2. ESRD continue with HD qTTS Dr.  Lowanda Foster, on HD.  Below EDW losing weight.  Poor po intake.  UF 0.5L today. Anuric. 3. Anemia: cont with ESA.  Hemoglobin at goal, cont ESA. 4. CKD-MBD: continue with binders and vit D 5. Nutrition: renal diet 6. Hypertension: not a current issue, see below for a fib. 7. DM- stable 8. Afib: on cardizem and metoprolol, Coumadin per pharmacy. Cardiology involved now given tachycardia - midodrine added yesterday to allow titration of blocking agents.   Jannifer Hick MD

## 2018-04-06 NOTE — Progress Notes (Signed)
PROGRESS NOTE    Albert [name]  HDQ:[id number] DOB: 13-Sep-1955 DOA: 03/20/2018 PCP: Redmond School, MD   Brief Narrative: 63 year old male with history of chronic A. fib on Coumadin, diabetes mellitus type 2, ESRD on HD TTS  with left lower extremity cellulitis, PVD. Patient was admitted at [facility] on 03/20/2018 with cellulitis of the lower extremity after presenting from home with a left heel discomfort.  ABI was performed that showed compromised blood flow.  X-ray of the heel did not show osteomyelitis.  Patient was transferred to [facility] is going for arteriogram after INR was acceptable.  Underwent arteriogram on 2/3 and on 2/5 had left transmetatarsal of 2nd toe and transmetatarsal amputation of 1 2 and 3 right toes.  Patient has been in A. fib intermittently hypotensive and rate poorly controlled.  Cardiology consulted 2/12.  Subjective: Resting. On RA. No CP,SOB, palpitation. No fever,  Acute events noted overnight w RVR and hypotension in 80s. HR at rest in 110-120s this am  Assessment & Plan:   Cellulitis of left lower extremity/Bilateral dry gangrenous toes/severe bilateral peripheral vascular disease: Status post left second toe transmetatarsal amputation and right 1 2 and 3 transmetatarsal complication.  Vascular surgery following closely, patient is on aspirin, Coumadin, statin.  INR is supratherapeutic.  Informed by the vascular surgery today patient will need amputation sooner and advised to hold the Coumadin, monitor INR and it will need to be at least 1.8 or below per vascular.Patient completed Ancef 2/9.  Essential hypertension, benign : Blood pressure  80-100s. Started on midodrine 2/12. Pt on Cardizem and Lopressor for A. fib rate control.  Chronic Atrial fibrillation:Rate poorly controlled, blood pressure has been soft. Coreg has been discontinued and switched to metoprolol- appreciate cardiology input, metoprolol increased to 50 twice daily, midodrine added  at 5 mg BID and Rate fairly stable blood pressure More stable except for episode in 80s-90s during dialysis this am  Also on Cardizem 180 mg. INR  At 3.1.  Cardiology on board appreciate input.  Vascular surgery recommending amputation and if desired by vascaulr and okay w card will consider  INR reversal. Await vascular plan. Discussed w cardio this am.   ESRD on HD: Nephrology on board, appreciate input. Last HD 2/11 and getting HD 2/13. Patient has poor intake, no signs of volume overload.  Supratherapeutic INR : holding coumadin, INR at 3.1  Anemia of chronic renal disease, continue with ESA, hemoglobin STABLE 9-10 GM.  Diabetes mellitus : Non-insulin-dependent.  Home tradjenta and glipizide on hold.Blood sugar well controlled on sliding scale insulin.  Monitor Accu-Chek before meals at bedtime.  Discussed with vascular surgeon, will plan on vitamin K p.o. today and repeat INR in the morning and if INR >1.8 can transfuse FFP for amputation tomorrow.   DVT prophylaxis: coumadin, INR >1.5 Code Status: full Family Communication: none at bedside  Disposition Plan: Remains inpatient pending clinical improvement. Per vascular surgery will need amputation soon.  Patient will need CIR placement eventually.   Consultants:  Nephrology Vascular surgery Cardiology  Procedures:  HD: LAST 2/11  03/27/18 Arteriogram with balloon angioplasty of the left posterior tibial artery Aorta and iliac segments are heavily calcified and tortuous however there is no flow-limiting stenosis.  SFAs are heavily calcified no flow-limiting stenosis.  Right side appears to have runoff via the posterior tibial and anterior tibial arteries without flow-limiting stenosis.  Left side has runoff via the anterior tibial and peroneal posterior tibial occludes above the ankle.  After intervention  there is less than 30% residual stenosis and a strong signal at the ankle.Patient will need amputation of bilateral second toes at  least and we will watch the ulceration of his heel on the left but he remains high risk for proximal leg amputations bilaterally.  03/29/18 1. left transmetatarsal amputation second toe. 2. Large amount of necrotic debris and gangrenous tissue toes 1 2 and 3 right foot requiring Transmetatarsal amputation of these rather than isolated second toe amputation.  Antimicrobials: Anti-infectives (From admission, onward)   Start     Dose/Rate Route Frequency Ordered Stop   03/20/18 2200  ceFAZolin (ANCEF) IVPB 1 g/50 mL premix  Status:  Discontinued     1 g 100 mL/hr over 30 Minutes Intravenous Every 24 hours 03/20/18 2112 04/02/18 0917       Objective: Vitals:   04/06/18 0800 04/06/18 0830 04/06/18 0900 04/06/18 0930  BP: (!) 92/59 111/72 95/71 120/70  Pulse: 91 (!) 106 (!) 115 93  Resp:      Temp:      TempSrc:      SpO2:      Weight:      Height:        Intake/Output Summary (Last 24 hours) at 04/06/2018 0954 Last data filed at 04/05/2018 1835 Gross per 24 hour  Intake 360 ml  Output 0 ml  Net 360 ml   Filed Weights   04/05/18 0359 04/06/18 0340 04/06/18 0715  Weight: 91.7 kg 94.5 kg 94.4 kg   Weight change: 0.4 kg  Body mass index is 26.72 kg/m.  Intake/Output from previous day: 02/12 0701 - 02/13 0700 In: 600 [P.O.:600] Out: 0  Intake/Output this shift: No intake/output data recorded.  Examination: General exam: Calm, comfortable, not in acute distress, older for age.  HEENT:Oral mucosa moist, Ear/Nose WNL grossly, dentition normal. Respiratory system: Bilateral equal air entry, no crackles and wheezing, no use of accessory muscle, non tender on palpation. Cardiovascular system: Irregularly irregular heart rate in the 100s S1 & S2 heard, No JVD/murmurs. Gastrointestinal system: Abdomen soft, non-tender, non-distended, BS +. No hepatosplenomegaly palpable. Nervous System:Alert, awake and oriented at baseline. Able to move UE and LE, sensation intact. Extremities:  No edema.  Follow smell coming from lower extremities, right 1-3 transmetatarsal amputation, left second toe amputation, left post ankle are of skin discoloration w small anount of fluid. Cool appearing b/l LE and intact sensation.   Skin: No rashes,no icterus. MSK: Normal muscle bulk,tone, power AVF+ LUE w/ thrill/bruit.  Medications:  Scheduled Meds: . allopurinol  100 mg Oral Daily  . aspirin EC  81 mg Oral Daily  . Chlorhexidine Gluconate Cloth  6 each Topical Q0600  . cinacalcet  30 mg Oral Q supper  . [START ON 04/08/2018] darbepoetin (ARANESP) injection - DIALYSIS  100 mcg Intravenous Q Sat-HD  . diltiazem  180 mg Oral Daily  . doxercalciferol  3.5 mcg Intravenous Q T,Th,Sa-HD  . insulin aspart  0-9 Units Subcutaneous TID WC  . metoprolol tartrate  50 mg Oral BID  . midodrine  5 mg Oral BID WC  . pantoprazole  40 mg Oral Daily  . pravastatin  5 mg Oral QPM  . senna-docusate  1 tablet Oral BID  . sevelamer carbonate  1,600 mg Oral With snacks  . sevelamer carbonate  3,200 mg Oral TID WC  . sodium chloride flush  3 mL Intravenous Q12H  . Warfarin - Pharmacist Dosing Inpatient   Does not apply q1800   Continuous Infusions: .  sodium chloride    . sodium chloride    . sodium chloride    . sodium chloride    . sodium chloride      Data Reviewed: I have personally reviewed following labs and imaging studies  CBC: Recent Labs  Lab 03/31/18 0336 04/01/18 1324 04/04/18 1431 04/05/18 0315 04/06/18 0256  WBC 10.7* 9.1 14.0* 13.4* 9.6  HGB 10.1* 9.2* 10.1* 10.1* 10.3*  HCT 32.5* 29.2* 31.6* 33.0* 33.4*  MCV 89.5 88.0 87.5 87.5 88.6  PLT 219 210 252 255 696   Basic Metabolic Panel: Recent Labs  Lab 03/31/18 0336 04/01/18 1302 04/04/18 0401 04/04/18 1429 04/05/18 0315 04/06/18 0256  NA 136 133*  --  133* 135 135  K 4.5 4.2  --  4.1 3.7 3.8  CL 89* 92*  --  95* 95* 93*  CO2 27 24  --  22 27 23   GLUCOSE 148* 145*  --  129* 146* 147*  BUN 28* 54*  --  74* 31* 49*    CREATININE 6.10* 8.51*  --  10.34* 6.13* 8.53*  CALCIUM 8.9 8.7*  --  8.7* 8.5* 8.5*  MG  --   --  1.9  --   --   --   PHOS  --  5.2*  --  4.2 4.1  --    GFR: Estimated Creatinine Clearance: 10.2 mL/min (A) (by C-G formula based on SCr of 8.53 mg/dL (H)). Liver Function Tests: Recent Labs  Lab 04/01/18 1302 04/04/18 1429 04/05/18 0315  ALBUMIN 1.8* 1.7* 1.8*   No results for input(s): LIPASE, AMYLASE in the last 168 hours. No results for input(s): AMMONIA in the last 168 hours. Coagulation Profile: Recent Labs  Lab 04/02/18 0342 04/03/18 0344 04/04/18 0401 04/05/18 0315 04/06/18 0256  INR 3.58 3.54 3.73 3.87 3.10   Cardiac Enzymes: No results for input(s): CKTOTAL, CKMB, CKMBINDEX, TROPONINI in the last 168 hours. BNP (last 3 results) No results for input(s): PROBNP in the last 8760 hours. HbA1C: No results for input(s): HGBA1C in the last 72 hours. CBG: Recent Labs  Lab 04/05/18 1135 04/05/18 1625 04/05/18 2226 04/05/18 2242 04/06/18 0616  GLUCAP 160* 140* 137* 149* 146*   Lipid Profile: No results for input(s): CHOL, HDL, LDLCALC, TRIG, CHOLHDL, LDLDIRECT in the last 72 hours. Thyroid Function Tests: Recent Labs    04/04/18 0401  TSH 3.182   Anemia Panel: No results for input(s): VITAMINB12, FOLATE, FERRITIN, TIBC, IRON, RETICCTPCT in the last 72 hours. Sepsis Labs: No results for input(s): PROCALCITON, LATICACIDVEN in the last 168 hours.  No results found for this or any previous visit (from the past 240 hour(s)).    Radiology Studies: No results found.    LOS: 17 days   Time spent: More than 50% of that time was spent in counseling and/or coordination of care.  Antonieta Pert, MD [facility]  04/06/2018, 9:54 AM

## 2018-04-07 ENCOUNTER — Encounter (HOSPITAL_COMMUNITY): Payer: Self-pay

## 2018-04-07 ENCOUNTER — Encounter (HOSPITAL_COMMUNITY)
Admission: EM | Disposition: A | Discharge status: Skilled Nursing Facility | Payer: Self-pay | Source: Home / Self Care | Attending: Internal Medicine

## 2018-04-07 LAB — GLUCOSE, CAPILLARY
Glucose-Capillary: 166 mg/dL — ABNORMAL HIGH (ref 70–99)
Glucose-Capillary: 148 mg/dL — ABNORMAL HIGH (ref 70–99)
Glucose-Capillary: 143 mg/dL — ABNORMAL HIGH (ref 70–99)
Glucose-Capillary: 127 mg/dL — ABNORMAL HIGH (ref 70–99)

## 2018-04-07 LAB — CBC
HCT: 32.6 % — ABNORMAL LOW (ref 39.0–52.0)
HCT: 30.3 % — ABNORMAL LOW (ref 39.0–52.0)
Hemoglobin: 10.3 g/dL — ABNORMAL LOW (ref 13.0–17.0)
Hemoglobin: 9.6 g/dL — ABNORMAL LOW (ref 13.0–17.0)
MCH: 28.1 pg (ref 26.0–34.0)
MCH: 28.2 pg (ref 26.0–34.0)
MCHC: 31.6 g/dL (ref 30.0–36.0)
MCHC: 31.7 g/dL (ref 30.0–36.0)
MCV: 89.1 fL (ref 80.0–100.0)
MCV: 88.9 fL (ref 80.0–100.0)
Platelets: 243 10*3/uL (ref 150–400)
Platelets: 232 10*3/uL (ref 150–400)
RBC: 3.66 MIL/uL — AB (ref 4.22–5.81)
RBC: 3.41 MIL/uL — ABNORMAL LOW (ref 4.22–5.81)
RDW: 15.1 % (ref 11.5–15.5)
RDW: 15 % (ref 11.5–15.5)
WBC: 10.1 10*3/uL (ref 4.0–10.5)
WBC: 10.6 10*3/uL — ABNORMAL HIGH (ref 4.0–10.5)
nRBC: 0 % (ref 0.0–0.2)
nRBC: 0 % (ref 0.0–0.2)

## 2018-04-07 LAB — BASIC METABOLIC PANEL
Anion gap: 13 (ref 5–15)
BUN: 30 mg/dL — ABNORMAL HIGH (ref 8–23)
CO2: 28 mmol/L (ref 22–32)
Calcium: 8.6 mg/dL — ABNORMAL LOW (ref 8.9–10.3)
Chloride: 96 mmol/L — ABNORMAL LOW (ref 98–111)
Creatinine, Ser: 6.42 mg/dL — ABNORMAL HIGH (ref 0.61–1.24)
GFR calc Af Amer: 10 mL/min — ABNORMAL LOW (ref >60)
GFR calc non Af Amer: 8 mL/min — ABNORMAL LOW (ref >60)
GLUCOSE: 152 mg/dL — AB (ref 70–99)
Potassium: 4.4 mmol/L (ref 3.5–5.1)
Sodium: 137 mmol/L (ref 135–145)

## 2018-04-07 LAB — HEPARIN LEVEL (UNFRACTIONATED): Heparin Unfractionated: 0.1 IU/mL — ABNORMAL LOW (ref 0.30–0.70)

## 2018-04-07 LAB — PROTIME-INR
INR: 1.59
Prothrombin Time: 18.7 seconds — ABNORMAL HIGH (ref 11.4–15.2)

## 2018-04-07 SURGERY — AMPUTATION BELOW KNEE
Anesthesia: General | Laterality: Left

## 2018-04-07 MED ORDER — EPHEDRINE 5 MG/ML INJ
INTRAVENOUS | Status: AC
  Filled 2018-04-07: qty 10

## 2018-04-07 MED ORDER — SUCCINYLCHOLINE CHLORIDE 200 MG/10ML IV SOSY
PREFILLED_SYRINGE | INTRAVENOUS | Status: AC
  Filled 2018-04-07: qty 10

## 2018-04-07 MED ORDER — AMIODARONE HCL IN DEXTROSE 360-4.14 MG/200ML-% IV SOLN
30.0000 mg/h | INTRAVENOUS | Status: DC
  Administered 2018-04-08 – 2018-04-09 (×3): 30 mg/h via INTRAVENOUS
  Filled 2018-04-07 (×3): qty 200

## 2018-04-07 MED ORDER — PROPOFOL 10 MG/ML IV BOLUS
INTRAVENOUS | Status: AC
  Filled 2018-04-07: qty 20

## 2018-04-07 MED ORDER — AMIODARONE LOAD VIA INFUSION
150.0000 mg | Freq: Once | INTRAVENOUS | Status: AC
  Administered 2018-04-07: 150 mg via INTRAVENOUS
  Filled 2018-04-07: qty 83.34

## 2018-04-07 MED ORDER — AMIODARONE HCL IN DEXTROSE 360-4.14 MG/200ML-% IV SOLN
60.0000 mg/h | INTRAVENOUS | Status: AC
  Administered 2018-04-07 (×2): 60 mg/h via INTRAVENOUS
  Filled 2018-04-07 (×2): qty 200

## 2018-04-07 MED ORDER — ROCURONIUM BROMIDE 50 MG/5ML IV SOSY
PREFILLED_SYRINGE | INTRAVENOUS | Status: AC
  Filled 2018-04-07: qty 10

## 2018-04-07 MED ORDER — METOPROLOL TARTRATE 5 MG/5ML IV SOLN
INTRAVENOUS | Status: AC
  Filled 2018-04-07: qty 5

## 2018-04-07 MED ORDER — ESMOLOL HCL 100 MG/10ML IV SOLN
INTRAVENOUS | Status: AC
  Filled 2018-04-07: qty 10

## 2018-04-07 MED ORDER — LIDOCAINE 2% (20 MG/ML) 5 ML SYRINGE
INTRAMUSCULAR | Status: AC
  Filled 2018-04-07: qty 5

## 2018-04-07 MED ORDER — MIDAZOLAM HCL 2 MG/2ML IJ SOLN
INTRAMUSCULAR | Status: AC
  Filled 2018-04-07: qty 2

## 2018-04-07 MED ORDER — CHLORHEXIDINE GLUCONATE CLOTH 2 % EX PADS: 6.0000 | MEDICATED_PAD | Freq: Every day | CUTANEOUS | Status: DC

## 2018-04-07 MED ORDER — PHENYLEPHRINE 40 MCG/ML (10ML) SYRINGE FOR IV PUSH (FOR BLOOD PRESSURE SUPPORT)
PREFILLED_SYRINGE | INTRAVENOUS | Status: AC
  Filled 2018-04-07: qty 10

## 2018-04-07 MED ORDER — SODIUM CHLORIDE 0.9 % IV SOLN
INTRAVENOUS | Status: DC
  Administered 2018-04-07 – 2018-04-09 (×4): via INTRAVENOUS

## 2018-04-07 MED ORDER — ONDANSETRON HCL 4 MG/2ML IJ SOLN
INTRAMUSCULAR | Status: AC
  Filled 2018-04-07: qty 4

## 2018-04-07 MED ORDER — HEPARIN (PORCINE) 25000 UT/250ML-% IV SOLN
1500.0000 [IU]/h | INTRAVENOUS | Status: DC
  Filled 2018-04-07: qty 250

## 2018-04-07 MED ORDER — FENTANYL CITRATE (PF) 250 MCG/5ML IJ SOLN
INTRAMUSCULAR | Status: AC
  Filled 2018-04-07: qty 5

## 2018-04-07 MED ORDER — DEXAMETHASONE SODIUM PHOSPHATE 10 MG/ML IJ SOLN
INTRAMUSCULAR | Status: AC
  Filled 2018-04-07: qty 2

## 2018-04-07 MED ORDER — CEFAZOLIN SODIUM 1 G IJ SOLR
INTRAMUSCULAR | Status: AC
  Filled 2018-04-07: qty 20

## 2018-04-07 MED ORDER — HEPARIN SODIUM (PORCINE) 1000 UNIT/ML IJ SOLN
INTRAMUSCULAR | Status: AC
  Filled 2018-04-07: qty 1

## 2018-04-07 NOTE — Anesthesia Preprocedure Evaluation (Addendum)
Anesthesia Evaluation  Patient identified by MRN, date of birth, ID band Patient awake    Reviewed: Allergy & Precautions, NPO status , Patient's Chart, lab work & pertinent test results  Airway Mallampati: III  TM Distance: >3 FB Neck ROM: Full    Dental  (+) Missing, Dental Advisory Given,    Pulmonary sleep apnea , former smoker,    Pulmonary exam normal breath sounds clear to auscultation       Cardiovascular hypertension, Pt. on home beta blockers + Peripheral Vascular Disease and +CHF  + dysrhythmias Atrial Fibrillation  Rhythm:Irregular Rate:Normal  ECG: rate 135, Atrial fibrillation with rapid ventricular response with premature ventricular or aberrantly conducted complexes  ECHO: LV EF: 50% -   55%  Sees cardiologist Domenic Polite)   Neuro/Psych negative neurological ROS  negative psych ROS   GI/Hepatic Neg liver ROS, GERD  Medicated and Controlled,  Endo/Other  diabetes, Oral Hypoglycemic Agents  Renal/GU ESRF and DialysisRenal diseaseOn HD T, R, Sat     Musculoskeletal negative musculoskeletal ROS (+)   Abdominal   Peds  Hematology  (+) Blood dyscrasia, anemia , Gout HLD   Anesthesia Other Findings nonviable tissue left leg  Reproductive/Obstetrics                            Anesthesia Physical Anesthesia Plan  ASA: IV  Anesthesia Plan: General   Post-op Pain Management:    Induction: Intravenous  PONV Risk Score and Plan: 3 and Ondansetron, Dexamethasone and Midazolam  Airway Management Planned: Oral ETT  Additional Equipment:   Intra-op Plan:   Post-operative Plan: Extubation in OR  Informed Consent: I have reviewed the patients History and Physical, chart, labs and discussed the procedure including the risks, benefits and alternatives for the proposed anesthesia with the patient or authorized representative who has indicated his/her understanding and acceptance.       Dental advisory given  Plan Discussed with: CRNA  Anesthesia Plan Comments:        Anesthesia Quick Evaluation

## 2018-04-07 NOTE — Progress Notes (Signed)
Patient given a total dose 100 mg of esmolol and 160 mcg of phenylephrine. Heart rate remains over 100. Spoke with cardiology. Will attempt to improve heart rate prior to surgery. Vascular surgery notified.

## 2018-04-07 NOTE — Progress Notes (Signed)
[place]  Patient has gone for surgery.  BP is better.  He has only received two doses of midodrine 10mg  so far.  He remains in atrial fibrillation with rates mostly in the 100s-120s.  Of note, he never received diltiazem yesterday, as his BP was low before the midodrine was increased.  Therefore, we will not increase his medications as it remains unclear where his HR will settle on the current regimen.  Amiodarone can be added if necessary.  Once he is is back on anticoagulation consider TEE/DCCV if rates remain difficult to control.  We will continue to follow.  Albert [name] C. Oval Linsey, MD, [facility] 04/07/2018 11:40 AM

## 2018-04-07 NOTE — Progress Notes (Signed)
Procedure canceled due to uncontrolled heart rate.  Report called to Katharine Look, nurse on the floor.

## 2018-04-07 NOTE — Progress Notes (Signed)
OT Cancellation Note  Patient Details Name: Albert [name] MRN: [id number] DOB: [birth year]   Cancelled Treatment:    Reason Eval/Treat Not Completed: Medical issues which prohibited therapy(Scheduled to have L below versus above-knee amputation today)   Ebony Hail Harold Hedge) Marsa Aris OTR/L Acute Rehabilitation Services Pager: [phone] Office: [phone]   Fredda Hammed 04/07/2018, 9:02 AM

## 2018-04-07 NOTE — H&P (View-Only) (Signed)
  Progress Note    04/07/2018 8:22 AM 9 Days Post-Op  Subjective:  No overnight issues  Vitals:   04/07/18 0759 04/07/18 0812  BP: 97/76 115/72  Pulse:    Resp: (!) 30 (!) 23  Temp: 98.4 F (36.9 C)   SpO2:      Physical Exam: aaox3 Palpable left popliteal pulse Progressive necrotic changes left heel  CBC    Component Value Date/Time   WBC 10.1 04/07/2018 0403   RBC 3.66 (L) 04/07/2018 0403   HGB 10.3 (L) 04/07/2018 0403   HCT 32.6 (L) 04/07/2018 0403   PLT 243 04/07/2018 0403   MCV 89.1 04/07/2018 0403   MCH 28.1 04/07/2018 0403   MCHC 31.6 04/07/2018 0403   RDW 15.1 04/07/2018 0403   LYMPHSABS 0.7 03/20/2018 1931   MONOABS 0.7 03/20/2018 1931   EOSABS 0.1 03/20/2018 1931   BASOSABS 0.0 03/20/2018 1931    BMET    Component Value Date/Time   NA 137 04/07/2018 0403   K 4.4 04/07/2018 0403   CL 96 (L) 04/07/2018 0403   CO2 28 04/07/2018 0403   GLUCOSE 152 (H) 04/07/2018 0403   BUN 30 (H) 04/07/2018 0403   CREATININE 6.42 (H) 04/07/2018 0403   CALCIUM 8.6 (L) 04/07/2018 0403   CALCIUM 9.0 03/21/2007 0415   GFRNONAA 8 (L) 04/07/2018 0403   GFRAA 10 (L) 04/07/2018 0403    INR    Component Value Date/Time   INR 1.59 04/07/2018 0403     Intake/Output Summary (Last 24 hours) at 04/07/2018 2426 Last data filed at 04/06/2018 1128 Gross per 24 hour  Intake -  Output 500 ml  Net -500 ml     Assessment:  63 y.o. male is here with bilateral lower extremity ulceration status post amputation of toes on the right and left second toe with progressive heel ulceration on the left.  Vitamin K administered yesterday and INR has normalized  Plan: Or today for left below versus above-knee amputation.  I discussed this with him and he agrees to proceed.  Brandon C. Donzetta Matters, MD [facility] [place] Office: [phone] Pager: [phone]  04/07/2018 8:22 AM

## 2018-04-07 NOTE — Progress Notes (Signed)
PT Cancellation Note  Patient Details Name: Albert [name] MRN: [id number] DOB: Nov 22, 1955   Cancelled Treatment:    Reason Eval/Treat Not Completed: Patient at procedure or test/unavailable Pt in OR planning for L above knee versus below knee amputation.  Ellamae Sia, PT, DPT Acute Rehabilitation Services Pager [phone] Office [phone]    Willy Eddy 04/07/2018, 11:18 AM

## 2018-04-07 NOTE — Progress Notes (Signed)
Per nurse on floor, patient's HR 115-120's and BP 97/76.  Notified Dr. Roanna Banning and not sending for patient at this time.  Nurse on floor instructed to contact hospitalist.  Dr. Donnetta Hutching aware.

## 2018-04-07 NOTE — Progress Notes (Signed)
  Progress Note    04/07/2018 8:22 AM 9 Days Post-Op  Subjective:  No overnight issues  Vitals:   04/07/18 0759 04/07/18 0812  BP: 97/76 115/72  Pulse:    Resp: (!) 30 (!) 23  Temp: 98.4 F (36.9 C)   SpO2:      Physical Exam: aaox3 Palpable left popliteal pulse Progressive necrotic changes left heel  CBC    Component Value Date/Time   WBC 10.1 04/07/2018 0403   RBC 3.66 (L) 04/07/2018 0403   HGB 10.3 (L) 04/07/2018 0403   HCT 32.6 (L) 04/07/2018 0403   PLT 243 04/07/2018 0403   MCV 89.1 04/07/2018 0403   MCH 28.1 04/07/2018 0403   MCHC 31.6 04/07/2018 0403   RDW 15.1 04/07/2018 0403   LYMPHSABS 0.7 03/20/2018 1931   MONOABS 0.7 03/20/2018 1931   EOSABS 0.1 03/20/2018 1931   BASOSABS 0.0 03/20/2018 1931    BMET    Component Value Date/Time   NA 137 04/07/2018 0403   K 4.4 04/07/2018 0403   CL 96 (L) 04/07/2018 0403   CO2 28 04/07/2018 0403   GLUCOSE 152 (H) 04/07/2018 0403   BUN 30 (H) 04/07/2018 0403   CREATININE 6.42 (H) 04/07/2018 0403   CALCIUM 8.6 (L) 04/07/2018 0403   CALCIUM 9.0 03/21/2007 0415   GFRNONAA 8 (L) 04/07/2018 0403   GFRAA 10 (L) 04/07/2018 0403    INR    Component Value Date/Time   INR 1.59 04/07/2018 0403     Intake/Output Summary (Last 24 hours) at 04/07/2018 8127 Last data filed at 04/06/2018 1128 Gross per 24 hour  Intake -  Output 500 ml  Net -500 ml     Assessment:  63 y.o. male is here with bilateral lower extremity ulceration status post amputation of toes on the right and left second toe with progressive heel ulceration on the left.  Vitamin K administered yesterday and INR has normalized  Plan: Or today for left below versus above-knee amputation.  I discussed this with him and he agrees to proceed.  Yoneko Talerico C. Donzetta Matters, MD [facility] [place] Office: [phone] Pager: [phone]  04/07/2018 8:22 AM

## 2018-04-07 NOTE — Progress Notes (Signed)
[facility]    NEPHROLOGY PROGRESS NOTE  SUBJECTIVE:   Seen in room - plans for OR amputation today.    He has no complaints just wants surgery to occur.   OBJECTIVE:  Vitals:   04/07/18 0845 04/07/18 1241  BP: 120/77 97/63  Pulse:    Resp:  20  Temp:  99.7 F (37.6 C)  SpO2:  96%   No intake or output data in the 24 hours ending 04/07/18 1537    [place]:  AAOx3 NAD HEENT: MMM [place] AT anicteric sclera CV:  Irreg, mildly tachy Lungs:  L/S CTA bilaterally Abd:  abd SNT/ND with normal BS Extremities:  No LE edema.  Left upper extremity AV fistula with a good thrill and bruit. Skin:  No skin rash Neuro; Conversant  MEDICATIONS:  . allopurinol  100 mg Oral Daily  . aspirin EC  81 mg Oral Daily  . Chlorhexidine Gluconate Cloth  6 each Topical Q0600  . Chlorhexidine Gluconate Cloth  6 each Topical Q0600  . cinacalcet  30 mg Oral Q supper  . [START ON 04/08/2018] darbepoetin (ARANESP) injection - DIALYSIS  100 mcg Intravenous Q Sat-HD  . diltiazem  180 mg Oral Daily  . doxercalciferol  3.5 mcg Intravenous Q T,Th,Sa-HD  . insulin aspart  0-9 Units Subcutaneous TID WC  . metoprolol tartrate  50 mg Oral BID  . midodrine  10 mg Oral TID WC  . pantoprazole  40 mg Oral Daily  . pravastatin  5 mg Oral QPM  . senna-docusate  1 tablet Oral BID  . sevelamer carbonate  1,600 mg Oral With snacks  . sevelamer carbonate  3,200 mg Oral TID WC  . sodium chloride flush  3 mL Intravenous Q12H  . Warfarin - Pharmacist Dosing Inpatient   Does not apply q1800    LABS:   CBC Latest Ref Rng & Units 04/07/2018 04/06/2018 04/05/2018  WBC 4.0 - 10.5 K/uL 10.1 9.6 13.4(H)  Hemoglobin 13.0 - 17.0 g/dL 10.3(L) 10.3(L) 10.1(L)  Hematocrit 39.0 - 52.0 % 32.6(L) 33.4(L) 33.0(L)  Platelets 150 - 400 K/uL 243 236 255    CMP Latest Ref Rng & Units 04/07/2018 04/06/2018 04/05/2018  Glucose 70 - 99 mg/dL 152(H) 147(H) 146(H)  BUN 8 - 23 mg/dL 30(H) 49(H) 31(H)  Creatinine 0.61 - 1.24 mg/dL  6.42(H) 8.53(H) 6.13(H)  Sodium 135 - 145 mmol/L 137 135 135  Potassium 3.5 - 5.1 mmol/L 4.4 3.8 3.7  Chloride 98 - 111 mmol/L 96(L) 93(L) 95(L)  CO2 22 - 32 mmol/L 28 23 27   Calcium 8.9 - 10.3 mg/dL 8.6(L) 8.5(L) 8.5(L)  Total Protein 6.5 - 8.1 g/dL - - -  Total Bilirubin 0.3 - 1.2 mg/dL - - -  Alkaline Phos 38 - 126 U/L - - -  AST 15 - 41 U/L - - -  ALT 17 - 63 U/L - - -    Lab Results  Component Value Date   PTH 350.0 (H) 03/21/2007   CALCIUM 8.6 (L) 04/07/2018   CAION 0.95 (L) 01/18/2017   PHOS 4.1 04/05/2018       Component Value Date/Time   COLORURINE YELLOW 10/12/2013 1045   APPEARANCEUR CLEAR 10/12/2013 1045   LABSPEC 1.015 10/12/2013 1045   PHURINE 8.0 10/12/2013 1045   GLUCOSEU NEGATIVE 10/12/2013 1045   HGBUR MODERATE (A) 10/12/2013 1045   BILIRUBINUR NEGATIVE 10/12/2013 1045   KETONESUR NEGATIVE 10/12/2013 1045   PROTEINUR 30 (A) 10/12/2013 1045   UROBILINOGEN 1.0 10/12/2013 1045  NITRITE NEGATIVE 10/12/2013 1045   LEUKOCYTESUR NEGATIVE 10/12/2013 1045      Component Value Date/Time   PHART 7.405 10/12/2013 0352   PCO2ART 32.2 (L) 10/12/2013 0352   PO2ART 111.0 (H) 10/12/2013 0352   HCO3 20.4 10/12/2013 0352   TCO2 28 01/18/2017 1346   ACIDBASEDEF 3.9 (H) 10/12/2013 0352   O2SAT 99.1 10/12/2013 0352       Component Value Date/Time   IRON 46 03/16/2007 0610   TIBC 358 03/16/2007 0610   FERRITIN 237 03/16/2007 0610   IRONPCTSAT 13 (L) 03/16/2007 0610    Hemodialysis prescription: TTS, 4 hours 15 minutes, [facility].  Dry weight 97 kg.  Left brachiocephalic fistula.   ASSESSMENT/PLAN:     1. Bilateral arterial insufficiency- with cellulitis and gangrenous changes.  On abx, S/p angio, s/p TMTs on both sides  2/5.  Further amputation today.  2. ESRD continue with HD qTTS Dr.  Lowanda Foster, on HD.  Below EDW losing weight.  Poor po intake.  UF 0.5L tomorrow. Anuric. Will need new EDW on D/C.  3. Anemia: cont with ESA.  Hemoglobin at goal, cont  ESA. 4. CKD-MBD: continue with binders and vit D 5. Nutrition: renal diet 6. Hypertension: not a current issue, see below for a fib. 7. DM- stable 8. Afib: on cardizem and metoprolol, Coumadin per pharmacy. Cardiology involved now given tachycardia - midodrine added by cardiology to allow titration of blocking agents.   Jannifer Hick MD

## 2018-04-07 NOTE — Progress Notes (Signed)
Patient ID: Albert [name], male   DOB: 1955/03/22, 63 y.o.   MRN: [id number] Surgery canceled for today.  Anesthesia feels needs more cardiac optimization due to tachycardia.  Plan amputation tomorrow.

## 2018-04-07 NOTE — Interval H&P Note (Signed)
History and Physical Interval Note:  04/07/2018 11:06 AM  Albert [name]  has presented today for surgery, with the diagnosis of nonviable tissue left leg  The various methods of treatment have been discussed with the patient and family. After consideration of risks, benefits and other options for treatment, the patient has consented to  Procedure(s): [place] (Left) as a surgical intervention .  The patient's history has been reviewed, patient examined, no change in status, stable for surgery.  I have reviewed the patient's chart and labs.  Questions were answered to the patient's satisfaction.     Curt Jews

## 2018-04-07 NOTE — Progress Notes (Signed)
ANTICOAGULATION CONSULT NOTE - Initial Consult  Pharmacy Consult for Heparin  Indication: atrial fibrillation  Allergies  Allergen Reactions  . Metformin And Related Other (See Comments)    Diarrhea, bloody stool, upset stomach.     Patient Measurements: Height: 6\' 2"  (188 cm) Weight: 203 lb 14.8 oz (92.5 kg) IBW/kg (Calculated) : 82.2  Vital Signs: Temp: 99.7 F (37.6 C) (02/14 1241) Temp Source: Oral (02/14 1241) BP: 97/63 (02/14 1241)  Labs: Recent Labs    04/05/18 0315 04/06/18 0256 04/07/18 0403  HGB 10.1* 10.3* 10.3*  HCT 33.0* 33.4* 32.6*  PLT 255 236 243  LABPROT 37.4* 31.5* 18.7*  INR 3.87 3.10 1.59  CREATININE 6.13* 8.53* 6.42*    Estimated Creatinine Clearance: 13.5 mL/min (A) (by C-G formula based on SCr of 6.42 mg/dL (H)).   Medical History: Past Medical History:  Diagnosis Date  . Anemia   . Atrial fibrillation ([place])   . Cardiomyopathy    LVEF 50-55% 8/11 - [facility]  . Colonic polyp   . Diverticulosis of colon   . ESRD on hemodialysis ([place])    Dr. Lowanda Foster [facility]  . Essential hypertension, benign   . Gout   . Mixed hyperlipidemia   . Neuropathy    in both feet  . OSA (obstructive sleep apnea)    CPAP  . Renal cell cancer ([place])   . Type 2 diabetes mellitus (HCC)    Type II    Assessment: 63 year old male on warfarin prior to admission for Afib, currently on hold post amputation today, now to begin heparin  INR = 1.59 today  Goal of Therapy:  Heparin level 0.3-0.7 units/ml Monitor platelets by anticoagulation protocol: Yes   Plan:  No heparin bolus Heparin drip at 1200 units / hr Heparin level 8 hours after heparin starts Daily heparin level, CBC  Thank you Anette Guarneri, PharmD [phone]  04/07/2018,1:16 PM

## 2018-04-07 NOTE — Care Management Important Message (Signed)
Important Message  Patient Details  Name: Albert [name] MRN: [id number] Date of Birth: 1956/02/14   Medicare Important Message Given:  Yes    [name] 04/07/2018, 11:52 AM

## 2018-04-07 NOTE — Progress Notes (Signed)
PROGRESS NOTE    Albert [name]  KZS:[id number] DOB: 10/23/1955 DOA: 03/20/2018 PCP: Redmond School, MD   Brief Narrative: 63 year old male with history of chronic A. fib on Coumadin, diabetes mellitus type 2, ESRD on HD TTS  with left lower extremity cellulitis, PVD. Patient was admitted at [facility] on 03/20/2018 with cellulitis of the lower extremity after presenting from home with a left heel discomfort.  ABI was performed that showed compromised blood flow.  X-ray of the heel did not show osteomyelitis.  Patient was transferred to [facility] is going for arteriogram after INR was acceptable.  Underwent arteriogram on 2/3 and on 2/5 had left transmetatarsal of 2nd toe and transmetatarsal amputation of 1 2 and 3 right toes.  Patient has been in A. fib intermittently hypotensive and rate poorly controlled.  Cardiology consulted 2/12.  Subjective: Seen this morning, he was waiting for surgery today.  Blood pressure somewhat soft and heart rate in the 110s.  Denies nausea vomiting chest pain fever chills.  Assessment & Plan:   Cellulitis of left lower extremity/Bilateral dry gangrenous toes/severe bilateral peripheral vascular disease: Status post left second toe transmetatarsal amputation and right 1, 2 and 3 transmetatarsal amputation. Vascular surgery running for left below versus above-knee amputation.  OR canceled today due to uncontrolled heart rate. Patient completed Ancef 2/9.  Essential hypertension, benign : Blood pressure, continue on midodrine started on 2/12. Pt on Cardizem and Lopressor for A. fib rate control.  Chronic Atrial fibrillation:Rate poorly controlled.Coreg has been discontinued and switched to metoprolol due to soft BP. HR still not well controlled. Cardiology on board-INR reversed for OR today but canceled. INR is at 1.5-Await for cardiology recommendation for possible heparin bridge however patient denies any history of stroke, CHADS2VASC is 3. .Cont on  metoprolol 50 bid, Cardizem 180 mg.   ESRD on HD: Nephrology on board, appreciate input. Last HD 2/13.Patient has poor intake, no signs of volume overload.  Anemia of chronic renal disease, continue with ESA, hemoglobin stable  Diabetes mellitus : Non-insulin-dependent.  Home tradjenta and glipizide on hold.Blood sugar well controlled on sliding scale insulin.  Monitor glucose  Discussed with the cardiologist, planning on amiodarone along with heparin protocol.  DVT prophylaxis: coumadin, INR >1.5. Code Status: full Family Communication: none at bedside  Disposition Plan: Remains inpatient pending clinical improvement. Per vascular surgery will need amputation soon.  Patient will need CIR placement eventually.   Consultants:  Nephrology Vascular surgery Cardiology  Procedures:  HD  03/27/18 Arteriogram with balloon angioplasty of the left posterior tibial artery Aorta and iliac segments are heavily calcified and tortuous however there is no flow-limiting stenosis.  SFAs are heavily calcified no flow-limiting stenosis.  Right side appears to have runoff via the posterior tibial and anterior tibial arteries without flow-limiting stenosis.  Left side has runoff via the anterior tibial and peroneal posterior tibial occludes above the ankle.  After intervention there is less than 30% residual stenosis and a strong signal at the ankle.Patient will need amputation of bilateral second toes at least and we will watch the ulceration of his heel on the left but he remains high risk for proximal leg amputations bilaterally.  03/29/18 1. left transmetatarsal amputation second toe. 2. Large amount of necrotic debris and gangrenous tissue toes 1 2 and 3 right foot requiring Transmetatarsal amputation of these rather than isolated second toe amputation.  Antimicrobials: Anti-infectives (From admission, onward)   Start     Dose/Rate Route Frequency Ordered Stop  04/07/18 0600  ceFAZolin (ANCEF) IVPB  2g/100 mL premix     2 g 200 mL/hr over 30 Minutes Intravenous To Short Stay 04/06/18 1510 04/08/18 0600   03/20/18 2200  ceFAZolin (ANCEF) IVPB 1 g/50 mL premix  Status:  Discontinued     1 g 100 mL/hr over 30 Minutes Intravenous Every 24 hours 03/20/18 2112 04/02/18 0917       Objective: Vitals:   04/07/18 0812 04/07/18 0830 04/07/18 0845 04/07/18 1241  BP: 115/72 118/74 120/77 97/63  Pulse:      Resp: (!) 23 (!) 25  20  Temp:    99.7 F (37.6 C)  TempSrc:    Oral  SpO2:    96%  Weight:      Height:       No intake or output data in the 24 hours ending 04/07/18 1257 Filed Weights   04/06/18 0715 04/06/18 1128 04/07/18 0321  Weight: 94.4 kg 94 kg 92.5 kg   Weight change: -0.1 kg  Body mass index is 26.18 kg/m.  Intake/Output from previous day: 02/13 0701 - 02/14 0700 In: -  Out: 500  Intake/Output this shift: No intake/output data recorded.  Examination:  General exam: Calm, comfortable, not in acute distress, older for age, average built.  HEENT:Oral mucosa moist, Ear/Nose WNL grossly, dentition normal. Respiratory system: Bilateral equal air entry, no crackles and wheezing, no use of accessory muscle, non tender on palpation. Cardiovascular system: regular rate and rhythm, S1 & S2 heard, No JVD/murmurs. Gastrointestinal system: Abdomen soft, non-tender, non-distended, BS +. No hepatosplenomegaly palpable. Nervous System:Alert, awake and oriented at baseline. Able to move UE and LE, sensation intact. Extremities: No edema, distal extremities cool-ower extremities, right 1-3 transmetatarsal amputation-wound healing, left second toe amputation, left post ankle are of skin discoloration w small amount of fluid. Intact sensation. Skin: No rashes,no icterus. MSK: Normal muscle bulk,tone, power aVF present left upper extremity with thrill bruit  Medications:  Scheduled Meds: . allopurinol  100 mg Oral Daily  . aspirin EC  81 mg Oral Daily  . Chlorhexidine  Gluconate Cloth  6 each Topical Q0600  . Chlorhexidine Gluconate Cloth  6 each Topical Q0600  . cinacalcet  30 mg Oral Q supper  . [START ON 04/08/2018] darbepoetin (ARANESP) injection - DIALYSIS  100 mcg Intravenous Q Sat-HD  . diltiazem  180 mg Oral Daily  . doxercalciferol  3.5 mcg Intravenous Q T,Th,Sa-HD  . insulin aspart  0-9 Units Subcutaneous TID WC  . metoprolol tartrate  50 mg Oral BID  . midodrine  10 mg Oral TID WC  . pantoprazole  40 mg Oral Daily  . pravastatin  5 mg Oral QPM  . senna-docusate  1 tablet Oral BID  . sevelamer carbonate  1,600 mg Oral With snacks  . sevelamer carbonate  3,200 mg Oral TID WC  . sodium chloride flush  3 mL Intravenous Q12H  . Warfarin - Pharmacist Dosing Inpatient   Does not apply q1800   Continuous Infusions: . sodium chloride    . sodium chloride 10 mL/hr at 04/07/18 1241  .  ceFAZolin (ANCEF) IV      Data Reviewed: I have personally reviewed following labs and imaging studies  CBC: Recent Labs  Lab 04/01/18 1324 04/04/18 1431 04/05/18 0315 04/06/18 0256 04/07/18 0403  WBC 9.1 14.0* 13.4* 9.6 10.1  HGB 9.2* 10.1* 10.1* 10.3* 10.3*  HCT 29.2* 31.6* 33.0* 33.4* 32.6*  MCV 88.0 87.5 87.5 88.6 89.1  PLT 210 252 255  236 335   Basic Metabolic Panel: Recent Labs  Lab 04/01/18 1302 04/04/18 0401 04/04/18 1429 04/05/18 0315 04/06/18 0256 04/07/18 0403  NA 133*  --  133* 135 135 137  K 4.2  --  4.1 3.7 3.8 4.4  CL 92*  --  95* 95* 93* 96*  CO2 24  --  22 27 23 28   GLUCOSE 145*  --  129* 146* 147* 152*  BUN 54*  --  74* 31* 49* 30*  CREATININE 8.51*  --  10.34* 6.13* 8.53* 6.42*  CALCIUM 8.7*  --  8.7* 8.5* 8.5* 8.6*  MG  --  1.9  --   --   --   --   PHOS 5.2*  --  4.2 4.1  --   --    GFR: Estimated Creatinine Clearance: 13.5 mL/min (A) (by C-G formula based on SCr of 6.42 mg/dL (H)). Liver Function Tests: Recent Labs  Lab 04/01/18 1302 04/04/18 1429 04/05/18 0315  ALBUMIN 1.8* 1.7* 1.8*   No results for input(s):  LIPASE, AMYLASE in the last 168 hours. No results for input(s): AMMONIA in the last 168 hours. Coagulation Profile: Recent Labs  Lab 04/03/18 0344 04/04/18 0401 04/05/18 0315 04/06/18 0256 04/07/18 0403  INR 3.54 3.73 3.87 3.10 1.59   Cardiac Enzymes: No results for input(s): CKTOTAL, CKMB, CKMBINDEX, TROPONINI in the last 168 hours. BNP (last 3 results) No results for input(s): PROBNP in the last 8760 hours. HbA1C: No results for input(s): HGBA1C in the last 72 hours. CBG: Recent Labs  Lab 04/06/18 1420 04/06/18 1643 04/06/18 2139 04/07/18 0630 04/07/18 1246  GLUCAP 169* 133* 132* 166* 148*   Lipid Profile: No results for input(s): CHOL, HDL, LDLCALC, TRIG, CHOLHDL, LDLDIRECT in the last 72 hours. Thyroid Function Tests: No results for input(s): TSH, T4TOTAL, FREET4, T3FREE, THYROIDAB in the last 72 hours. Anemia Panel: No results for input(s): VITAMINB12, FOLATE, FERRITIN, TIBC, IRON, RETICCTPCT in the last 72 hours. Sepsis Labs: No results for input(s): PROCALCITON, LATICACIDVEN in the last 168 hours.  No results found for this or any previous visit (from the past 240 hour(s)).    Radiology Studies: No results found.    LOS: 18 days   Time spent: More than 50% of that time was spent in counseling and/or coordination of care.  Antonieta Pert, MD [facility]  04/07/2018, 12:57 PM

## 2018-04-08 ENCOUNTER — Inpatient Hospital Stay (HOSPITAL_COMMUNITY): Payer: Medicare HMO | Admitting: Certified Registered Nurse Anesthetist

## 2018-04-08 ENCOUNTER — Encounter (HOSPITAL_COMMUNITY): Payer: Self-pay | Admitting: Registered Nurse

## 2018-04-08 ENCOUNTER — Encounter (HOSPITAL_COMMUNITY)
Admission: EM | Disposition: A | Discharge status: Skilled Nursing Facility | Payer: Self-pay | Source: Home / Self Care | Attending: Internal Medicine

## 2018-04-08 DIAGNOSIS — L03116 Cellulitis of left lower limb: Secondary | ICD-10-CM

## 2018-04-08 DIAGNOSIS — I96 Gangrene, not elsewhere classified: Secondary | ICD-10-CM

## 2018-04-08 HISTORY — PX: AMPUTATION: SHX166

## 2018-04-08 LAB — RENAL FUNCTION PANEL
Albumin: 1.7 g/dL — ABNORMAL LOW (ref 3.5–5.0)
Anion gap: 16 — ABNORMAL HIGH (ref 5–15)
BUN: 43 mg/dL — ABNORMAL HIGH (ref 8–23)
CHLORIDE: 93 mmol/L — AB (ref 98–111)
CO2: 24 mmol/L (ref 22–32)
Calcium: 8.5 mg/dL — ABNORMAL LOW (ref 8.9–10.3)
Creatinine, Ser: 7.98 mg/dL — ABNORMAL HIGH (ref 0.61–1.24)
GFR calc Af Amer: 7 mL/min — ABNORMAL LOW (ref >60)
GFR calc non Af Amer: 6 mL/min — ABNORMAL LOW (ref >60)
Glucose, Bld: 185 mg/dL — ABNORMAL HIGH (ref 70–99)
Phosphorus: 4.5 mg/dL (ref 2.5–4.6)
Potassium: 4.3 mmol/L (ref 3.5–5.1)
Sodium: 133 mmol/L — ABNORMAL LOW (ref 135–145)

## 2018-04-08 LAB — GLUCOSE, CAPILLARY
GLUCOSE-CAPILLARY: 141 mg/dL — AB (ref 70–99)
GLUCOSE-CAPILLARY: 200 mg/dL — AB (ref 70–99)
Glucose-Capillary: 135 mg/dL — ABNORMAL HIGH (ref 70–99)
Glucose-Capillary: 164 mg/dL — ABNORMAL HIGH (ref 70–99)

## 2018-04-08 LAB — HEPARIN LEVEL (UNFRACTIONATED): Heparin Unfractionated: <0.1 IU/mL — ABNORMAL LOW (ref 0.30–0.70)

## 2018-04-08 SURGERY — AMPUTATION BELOW KNEE
Anesthesia: General | Laterality: Left

## 2018-04-08 MED ORDER — FENTANYL CITRATE (PF) 250 MCG/5ML IJ SOLN
INTRAMUSCULAR | Status: DC | PRN
  Administered 2018-04-08 (×8): 50 ug via INTRAVENOUS

## 2018-04-08 MED ORDER — DARBEPOETIN ALFA 100 MCG/0.5ML IJ SOSY
PREFILLED_SYRINGE | INTRAMUSCULAR | Status: AC
  Administered 2018-04-08: 100 ug via INTRAVENOUS
  Filled 2018-04-08: qty 0.5

## 2018-04-08 MED ORDER — ALTEPLASE 2 MG IJ SOLR: 2.0000 mg | Freq: Once | INTRAMUSCULAR | Status: DC | PRN

## 2018-04-08 MED ORDER — ROCURONIUM BROMIDE 50 MG/5ML IV SOSY
PREFILLED_SYRINGE | INTRAVENOUS | Status: AC
  Filled 2018-04-08: qty 5

## 2018-04-08 MED ORDER — SODIUM CHLORIDE 0.9 % IV SOLN: 100.0000 mL | INTRAVENOUS | Status: DC | PRN

## 2018-04-08 MED ORDER — LIDOCAINE HCL (PF) 1 % IJ SOLN: 5.0000 mL | INTRAMUSCULAR | Status: DC | PRN

## 2018-04-08 MED ORDER — PENTAFLUOROPROP-TETRAFLUOROETH EX AERO: 1.0000 "application " | INHALATION_SPRAY | CUTANEOUS | Status: DC | PRN

## 2018-04-08 MED ORDER — ROCURONIUM BROMIDE 100 MG/10ML IV SOLN
INTRAVENOUS | Status: DC | PRN
  Administered 2018-04-08: 50 mg via INTRAVENOUS

## 2018-04-08 MED ORDER — FENTANYL CITRATE (PF) 250 MCG/5ML IJ SOLN
INTRAMUSCULAR | Status: AC
  Filled 2018-04-08: qty 5

## 2018-04-08 MED ORDER — ACETAMINOPHEN 500 MG PO TABS
ORAL_TABLET | ORAL | Status: AC
  Administered 2018-04-08: 1000 mg via ORAL
  Filled 2018-04-08: qty 2

## 2018-04-08 MED ORDER — PHENYLEPHRINE 40 MCG/ML (10ML) SYRINGE FOR IV PUSH (FOR BLOOD PRESSURE SUPPORT)
PREFILLED_SYRINGE | INTRAVENOUS | Status: DC | PRN
  Administered 2018-04-08 (×7): 80 ug via INTRAVENOUS

## 2018-04-08 MED ORDER — 0.9 % SODIUM CHLORIDE (POUR BTL) OPTIME
TOPICAL | Status: DC | PRN
  Administered 2018-04-08: 1000 mL

## 2018-04-08 MED ORDER — LIDOCAINE-PRILOCAINE 2.5-2.5 % EX CREA: 1.0000 "application " | TOPICAL_CREAM | CUTANEOUS | Status: DC | PRN

## 2018-04-08 MED ORDER — HEPARIN SODIUM (PORCINE) 1000 UNIT/ML DIALYSIS
1000.0000 [IU] | INTRAMUSCULAR | Status: DC | PRN
  Filled 2018-04-08: qty 1

## 2018-04-08 MED ORDER — SUGAMMADEX SODIUM 200 MG/2ML IV SOLN
INTRAVENOUS | Status: DC | PRN
  Administered 2018-04-08: 187 mg via INTRAVENOUS

## 2018-04-08 MED ORDER — CEFAZOLIN SODIUM-DEXTROSE 2-4 GM/100ML-% IV SOLN
INTRAVENOUS | Status: AC
  Filled 2018-04-08: qty 100

## 2018-04-08 MED ORDER — DOXERCALCIFEROL 4 MCG/2ML IV SOLN
INTRAVENOUS | Status: AC
  Administered 2018-04-08: 3.5 ug via INTRAVENOUS
  Filled 2018-04-08: qty 2

## 2018-04-08 MED ORDER — ALBUMIN HUMAN 5 % IV SOLN
INTRAVENOUS | Status: DC | PRN
  Administered 2018-04-08: 13:00:00 via INTRAVENOUS

## 2018-04-08 MED ORDER — ONDANSETRON HCL 4 MG/2ML IJ SOLN
INTRAMUSCULAR | Status: AC
  Filled 2018-04-08: qty 2

## 2018-04-08 MED ORDER — HEPARIN (PORCINE) 25000 UT/250ML-% IV SOLN
1650.0000 [IU]/h | INTRAVENOUS | Status: DC
  Administered 2018-04-09: 1500 [IU]/h via INTRAVENOUS
  Administered 2018-04-09 – 2018-04-10 (×2): 1650 [IU]/h via INTRAVENOUS
  Filled 2018-04-08 (×3): qty 250

## 2018-04-08 MED ORDER — DEXAMETHASONE SODIUM PHOSPHATE 10 MG/ML IJ SOLN
INTRAMUSCULAR | Status: AC
  Filled 2018-04-08: qty 1

## 2018-04-08 MED ORDER — PHENYLEPHRINE 40 MCG/ML (10ML) SYRINGE FOR IV PUSH (FOR BLOOD PRESSURE SUPPORT)
PREFILLED_SYRINGE | INTRAVENOUS | Status: AC
  Filled 2018-04-08: qty 10

## 2018-04-08 MED ORDER — LIDOCAINE 2% (20 MG/ML) 5 ML SYRINGE
INTRAMUSCULAR | Status: AC
  Filled 2018-04-08: qty 5

## 2018-04-08 MED ORDER — EPHEDRINE 5 MG/ML INJ
INTRAVENOUS | Status: AC
  Filled 2018-04-08: qty 10

## 2018-04-08 MED ORDER — MIDAZOLAM HCL 2 MG/2ML IJ SOLN
INTRAMUSCULAR | Status: AC
  Filled 2018-04-08: qty 2

## 2018-04-08 MED ORDER — CEFAZOLIN SODIUM-DEXTROSE 2-4 GM/100ML-% IV SOLN
2.0000 g | INTRAVENOUS | Status: AC
  Administered 2018-04-08: 2 g via INTRAVENOUS

## 2018-04-08 MED ORDER — LIDOCAINE HCL (CARDIAC) PF 100 MG/5ML IV SOSY
PREFILLED_SYRINGE | INTRAVENOUS | Status: DC | PRN
  Administered 2018-04-08: 60 mg via INTRAVENOUS

## 2018-04-08 MED ORDER — MORPHINE SULFATE (PF) 2 MG/ML IV SOLN
2.0000 mg | INTRAVENOUS | Status: DC | PRN
  Filled 2018-04-08: qty 1

## 2018-04-08 MED ORDER — ACETAMINOPHEN 500 MG PO TABS
1000.0000 mg | ORAL_TABLET | Freq: Once | ORAL | Status: AC
  Administered 2018-04-08: 1000 mg via ORAL

## 2018-04-08 MED ORDER — CEFAZOLIN SODIUM-DEXTROSE 1-4 GM/50ML-% IV SOLN
1.0000 g | INTRAVENOUS | Status: AC
  Administered 2018-04-09: 1 g via INTRAVENOUS
  Filled 2018-04-08: qty 50

## 2018-04-08 MED ORDER — DEXAMETHASONE SODIUM PHOSPHATE 10 MG/ML IJ SOLN
INTRAMUSCULAR | Status: DC | PRN
  Administered 2018-04-08: 10 mg via INTRAVENOUS

## 2018-04-08 MED ORDER — SODIUM CHLORIDE 0.9 % IV SOLN
INTRAVENOUS | Status: DC | PRN
  Administered 2018-04-08: 25 ug/min via INTRAVENOUS

## 2018-04-08 MED ORDER — MIDAZOLAM HCL 2 MG/2ML IJ SOLN
INTRAMUSCULAR | Status: DC | PRN
  Administered 2018-04-08: 2 mg via INTRAVENOUS

## 2018-04-08 MED ORDER — ONDANSETRON HCL 4 MG/2ML IJ SOLN
INTRAMUSCULAR | Status: DC | PRN
  Administered 2018-04-08: 4 mg via INTRAVENOUS

## 2018-04-08 MED ORDER — PROPOFOL 10 MG/ML IV BOLUS
INTRAVENOUS | Status: AC
  Filled 2018-04-08: qty 20

## 2018-04-08 MED ORDER — LIDOCAINE-PRILOCAINE 2.5-2.5 % EX CREA
1.0000 "application " | TOPICAL_CREAM | CUTANEOUS | Status: DC | PRN
  Filled 2018-04-08: qty 5

## 2018-04-08 MED ORDER — PROPOFOL 10 MG/ML IV BOLUS
INTRAVENOUS | Status: DC | PRN
  Administered 2018-04-08: 80 mg via INTRAVENOUS

## 2018-04-08 SURGICAL SUPPLY — 50 items
BANDAGE ACE 6X5 VEL STRL LF (GAUZE/BANDAGES/DRESSINGS) ×2 IMPLANT
BANDAGE ELASTIC 4 VELCRO ST LF (GAUZE/BANDAGES/DRESSINGS) ×1 IMPLANT
BANDAGE ESMARK 6X9 LF (GAUZE/BANDAGES/DRESSINGS) IMPLANT
BLADE SAW RECIP 87.9 MT (BLADE) ×2 IMPLANT
BNDG CMPR 9X6 STRL LF SNTH (GAUZE/BANDAGES/DRESSINGS)
BNDG COHESIVE 6X5 TAN STRL LF (GAUZE/BANDAGES/DRESSINGS) ×2 IMPLANT
BNDG ESMARK 6X9 LF (GAUZE/BANDAGES/DRESSINGS)
BNDG GAUZE ELAST 4 BULKY (GAUZE/BANDAGES/DRESSINGS) ×3 IMPLANT
CANISTER SUCT 3000ML PPV (MISCELLANEOUS) ×2 IMPLANT
CLIP VESOCCLUDE MED 6/CT (CLIP) IMPLANT
COVER BACK TABLE 60X90IN (DRAPES) ×2 IMPLANT
COVER SURGICAL LIGHT HANDLE (MISCELLANEOUS) ×2 IMPLANT
COVER WAND RF STERILE (DRAPES) ×2 IMPLANT
CUFF TOURNIQUET SINGLE 18IN (TOURNIQUET CUFF) IMPLANT
CUFF TOURNIQUET SINGLE 24IN (TOURNIQUET CUFF) IMPLANT
CUFF TOURNIQUET SINGLE 34IN LL (TOURNIQUET CUFF) IMPLANT
CUFF TOURNIQUET SINGLE 44IN (TOURNIQUET CUFF) IMPLANT
DRAIN CHANNEL 19F RND (DRAIN) IMPLANT
DRAPE HALF SHEET 40X57 (DRAPES) ×4 IMPLANT
DRAPE ORTHO SPLIT 77X108 STRL (DRAPES) ×4
DRAPE SURG ORHT 6 SPLT 77X108 (DRAPES) ×2 IMPLANT
DRSG ADAPTIC 3X8 NADH LF (GAUZE/BANDAGES/DRESSINGS) ×2 IMPLANT
ELECT REM PT RETURN 9FT ADLT (ELECTROSURGICAL) ×2
ELECTRODE REM PT RTRN 9FT ADLT (ELECTROSURGICAL) ×1 IMPLANT
EVACUATOR SILICONE 100CC (DRAIN) IMPLANT
GAUZE SPONGE 4X4 12PLY STRL (GAUZE/BANDAGES/DRESSINGS) ×4 IMPLANT
GAUZE SPONGE 4X4 12PLY STRL LF (GAUZE/BANDAGES/DRESSINGS) ×1 IMPLANT
GLOVE BIO SURGEON STRL SZ 6.5 (GLOVE) ×2 IMPLANT
GLOVE BIO SURGEON STRL SZ7.5 (GLOVE) ×2 IMPLANT
GLOVE ECLIPSE 7.5 STRL STRAW (GLOVE) ×1 IMPLANT
GOWN STRL REUS W/ TWL LRG LVL3 (GOWN DISPOSABLE) ×3 IMPLANT
GOWN STRL REUS W/TWL LRG LVL3 (GOWN DISPOSABLE) ×6
KIT BASIN OR (CUSTOM PROCEDURE TRAY) ×2 IMPLANT
KIT TURNOVER KIT B (KITS) ×2 IMPLANT
NS IRRIG 1000ML POUR BTL (IV SOLUTION) ×2 IMPLANT
PACK GENERAL/GYN (CUSTOM PROCEDURE TRAY) ×2 IMPLANT
PAD ARMBOARD 7.5X6 YLW CONV (MISCELLANEOUS) ×4 IMPLANT
STAPLER VISISTAT 35W (STAPLE) ×2 IMPLANT
STOCKINETTE IMPERVIOUS LG (DRAPES) ×2 IMPLANT
SUT ETHILON 3 0 PS 1 (SUTURE) IMPLANT
SUT SILK 2 0 (SUTURE) ×2
SUT SILK 2 0 SH CR/8 (SUTURE) ×2 IMPLANT
SUT SILK 2 0SH CR/8 30 (SUTURE) ×1 IMPLANT
SUT SILK 2-0 18XBRD TIE 12 (SUTURE) ×1 IMPLANT
SUT VIC AB 2-0 SH 18 (SUTURE) ×4 IMPLANT
SUT VIC AB 3-0 SH 27 (SUTURE) ×2
SUT VIC AB 3-0 SH 27X BRD (SUTURE) ×1 IMPLANT
TOWEL GREEN STERILE (TOWEL DISPOSABLE) ×4 IMPLANT
UNDERPAD 30X30 (UNDERPADS AND DIAPERS) ×2 IMPLANT
WATER STERILE IRR 1000ML POUR (IV SOLUTION) ×2 IMPLANT

## 2018-04-08 NOTE — Progress Notes (Signed)
PT Cancellation Note  Patient Details Name: Albert [name] MRN: [id number] DOB: 04/25/1955   Cancelled Treatment:    Reason Eval/Treat Not Completed: Patient at procedure or test/unavailable (plan for left BKA vs AKA today).  Ellamae Sia, PT, DPT Acute Rehabilitation Services Pager [phone] Office [phone]    Willy Eddy 04/08/2018, 12:41 PM

## 2018-04-08 NOTE — Progress Notes (Addendum)
RN verified the presence of a signed informed consent that matches stated procedure by patient. Verified armband matches patient's stated name and birth date. Verified NPO status and that all jewelry, contact, glasses, dentures, and partials had been removed (if applicable).  Dr. Ola Spurr, E notified of time that renal panel was drawn. Per Dr. Ola Spurr, E no need to obtain I- Stat.

## 2018-04-08 NOTE — Progress Notes (Signed)
[facility]    NEPHROLOGY PROGRESS NOTE  SUBJECTIVE:  Amputation held after Afib/fl with RVR yesterday.  Rate controlled this AM, plans for OR at noon.  He has no c/os.   OBJECTIVE:  Vitals:   04/08/18 0822 04/08/18 0823  BP: 124/69 124/69  Pulse:  (!) 102  Resp:    Temp:    SpO2:      Intake/Output Summary (Last 24 hours) at 04/08/2018 0839 Last data filed at 04/08/2018 0703 Gross per 24 hour  Intake 538.6 ml  Output 0 ml  Net 538.6 ml      Genearl:  AAOx3 NAD HEENT: MMM [address] AT anicteric sclera CV:  Reg, HR 90s; A flutter on monitor Lungs:  L/S CTA bilaterally Abd:  abd SNT/ND with normal BS Extremities:  No LE edema.  Left upper extremity AV fistula with a good thrill and bruit. Skin:  No skin rash Neuro; Conversant  MEDICATIONS:  . allopurinol  100 mg Oral Daily  . aspirin EC  81 mg Oral Daily  . Chlorhexidine Gluconate Cloth  6 each Topical Q0600  . Chlorhexidine Gluconate Cloth  6 each Topical Q0600  . cinacalcet  30 mg Oral Q supper  . darbepoetin (ARANESP) injection - DIALYSIS  100 mcg Intravenous Q Sat-HD  . diltiazem  180 mg Oral Daily  . doxercalciferol  3.5 mcg Intravenous Q T,Th,Sa-HD  . insulin aspart  0-9 Units Subcutaneous TID WC  . metoprolol tartrate  50 mg Oral BID  . midodrine  10 mg Oral TID WC  . pantoprazole  40 mg Oral Daily  . pravastatin  5 mg Oral QPM  . senna-docusate  1 tablet Oral BID  . sevelamer carbonate  1,600 mg Oral With snacks  . sevelamer carbonate  3,200 mg Oral TID WC  . sodium chloride flush  3 mL Intravenous Q12H  . Warfarin - Pharmacist Dosing Inpatient   Does not apply q1800    LABS:   CBC Latest Ref Rng & Units 04/07/2018 04/07/2018 04/06/2018  WBC 4.0 - 10.5 K/uL 10.6(H) 10.1 9.6  Hemoglobin 13.0 - 17.0 g/dL 9.6(L) 10.3(L) 10.3(L)  Hematocrit 39.0 - 52.0 % 30.3(L) 32.6(L) 33.4(L)  Platelets 150 - 400 K/uL 232 243 236    CMP Latest Ref Rng & Units 04/07/2018 04/07/2018 04/06/2018  Glucose 70 - 99  mg/dL 185(H) 152(H) 147(H)  BUN 8 - 23 mg/dL 43(H) 30(H) 49(H)  Creatinine 0.61 - 1.24 mg/dL 7.98(H) 6.42(H) 8.53(H)  Sodium 135 - 145 mmol/L 133(L) 137 135  Potassium 3.5 - 5.1 mmol/L 4.3 4.4 3.8  Chloride 98 - 111 mmol/L 93(L) 96(L) 93(L)  CO2 22 - 32 mmol/L 24 28 23   Calcium 8.9 - 10.3 mg/dL 8.5(L) 8.6(L) 8.5(L)  Total Protein 6.5 - 8.1 g/dL - - -  Total Bilirubin 0.3 - 1.2 mg/dL - - -  Alkaline Phos 38 - 126 U/L - - -  AST 15 - 41 U/L - - -  ALT 17 - 63 U/L - - -    Lab Results  Component Value Date   PTH 350.0 (H) 03/21/2007   CALCIUM 8.5 (L) 04/07/2018   CAION 0.95 (L) 01/18/2017   PHOS 4.5 04/07/2018       Component Value Date/Time   COLORURINE YELLOW 10/12/2013 1045   APPEARANCEUR CLEAR 10/12/2013 1045   LABSPEC 1.015 10/12/2013 1045   PHURINE 8.0 10/12/2013 1045   GLUCOSEU NEGATIVE 10/12/2013 1045   HGBUR MODERATE (A) 10/12/2013 1045   [place] 10/12/2013 1045  KETONESUR NEGATIVE 10/12/2013 1045   PROTEINUR 30 (A) 10/12/2013 1045   UROBILINOGEN 1.0 10/12/2013 1045   NITRITE NEGATIVE 10/12/2013 1045   LEUKOCYTESUR NEGATIVE 10/12/2013 1045      Component Value Date/Time   PHART 7.405 10/12/2013 0352   PCO2ART 32.2 (L) 10/12/2013 0352   PO2ART 111.0 (H) 10/12/2013 0352   HCO3 20.4 10/12/2013 0352   TCO2 28 01/18/2017 1346   ACIDBASEDEF 3.9 (H) 10/12/2013 0352   O2SAT 99.1 10/12/2013 0352       Component Value Date/Time   IRON 46 03/16/2007 0610   TIBC 358 03/16/2007 0610   FERRITIN 237 03/16/2007 0610   IRONPCTSAT 13 (L) 03/16/2007 0610    Hemodialysis prescription: TTS, 4 hours 15 minutes, [facility].  Dry weight 97 kg.  Left brachiocephalic fistula.   ASSESSMENT/PLAN:     1. Bilateral arterial insufficiency- with cellulitis and gangrenous changes.  On abx, S/p angio, s/p TMTs on both sides  2/5.  Further amputation today.  2. ESRD continue with HD qTTS Dr.  Lowanda Foster, on HD.  Below EDW losing weight.  Poor po intake.  HD with UF  0.5L today - will be after surgery.  Will need new EDW on D/C.  3. Anemia: cont with ESA.  Hemoglobin ~10, cont ESA. 4. CKD-MBD: continue with binders and vit D 5. Nutrition: renal diet 6. Hypertension: not a current issue, see below for a fib. 7. DM- stable 8. Afib: on cardizem and metoprolol, Coumadin per pharmacy. Cardiology involved now given tachycardia - midodrine added by cardiology to allow titration of blocking agents.   Jannifer Hick MD

## 2018-04-08 NOTE — Interval H&P Note (Signed)
History and Physical Interval Note:  04/08/2018 11:39 AM  Albert [name]  has presented today for surgery, with the diagnosis of nonviable tissue  The various methods of treatment have been discussed with the patient and family. After consideration of risks, benefits and other options for treatment, the patient has consented to  Procedure(s): [place] (Left) as a surgical intervention .  The patient's history has been reviewed, patient examined, no change in status, stable for surgery.  I have reviewed the patient's chart and labs.  Questions were answered to the patient's satisfaction.     Ruta Hinds

## 2018-04-08 NOTE — Progress Notes (Signed)
Progress Note  Patient Name: NORVAL SLAVEN Date of Encounter: 04/08/2018  Primary Cardiologist: Rozann Lesches, MD   Subjective   Feeling well.  No chest pain or palpitations.   Inpatient Medications    Scheduled Meds: . [MAR Hold] allopurinol  100 mg Oral Daily  . [MAR Hold] aspirin EC  81 mg Oral Daily  . [MAR Hold] Chlorhexidine Gluconate Cloth  6 each Topical Q0600  . [MAR Hold] Chlorhexidine Gluconate Cloth  6 each Topical Q0600  . [MAR Hold] cinacalcet  30 mg Oral Q supper  . [MAR Hold] darbepoetin (ARANESP) injection - DIALYSIS  100 mcg Intravenous Q Sat-HD  . [MAR Hold] diltiazem  180 mg Oral Daily  . [MAR Hold] doxercalciferol  3.5 mcg Intravenous Q T,Th,Sa-HD  . [MAR Hold] insulin aspart  0-9 Units Subcutaneous TID WC  . [MAR Hold] metoprolol tartrate  50 mg Oral BID  . [MAR Hold] midodrine  10 mg Oral TID WC  . [MAR Hold] pantoprazole  40 mg Oral Daily  . [MAR Hold] pravastatin  5 mg Oral QPM  . [MAR Hold] senna-docusate  1 tablet Oral BID  . [MAR Hold] sevelamer carbonate  1,600 mg Oral With snacks  . [MAR Hold] sevelamer carbonate  3,200 mg Oral TID WC  . [MAR Hold] sodium chloride flush  3 mL Intravenous Q12H  . [MAR Hold] Warfarin - Pharmacist Dosing Inpatient   Does not apply q1800   Continuous Infusions: . [MAR Hold] sodium chloride    . sodium chloride 10 mL/hr at 04/07/18 1241  . amiodarone 30 mg/hr (04/08/18 0053)  . heparin 1,500 Units/hr (04/08/18 0029)   PRN Meds: [MLY Hold] sodium chloride, [MAR Hold] acetaminophen, [MAR Hold] hydrALAZINE, [MAR Hold] HYDROcodone-acetaminophen, [MAR Hold] labetalol, [MAR Hold] ondansetron (ZOFRAN) IV, [MAR Hold] oxyCODONE, [MAR Hold] phenol, [MAR Hold] sodium chloride flush   Vital Signs    Vitals:   04/08/18 0623 04/08/18 0803 04/08/18 0822 04/08/18 0823  BP:  124/69 124/69 124/69  Pulse:  (!) 102  (!) 102  Resp: (!) 21     Temp:  98 F (36.7 C)    TempSrc:  Oral    SpO2: 100%     Weight: 93.5 kg      Height:        Intake/Output Summary (Last 24 hours) at 04/08/2018 1118 Last data filed at 04/08/2018 0703 Gross per 24 hour  Intake 538.6 ml  Output 0 ml  Net 538.6 ml   Last 3 Weights 04/08/2018 04/07/2018 04/06/2018  Weight (lbs) 206 lb 2.1 oz 203 lb 14.8 oz 207 lb 3.7 oz  Weight (kg) 93.5 kg 92.5 kg 94 kg      Telemetry    Atrial fibrillation.  Rate <100 bpm.  PVCs - Personally Reviewed  ECG    n/a - Personally Reviewed  Physical Exam   VS:  BP 124/69   Pulse (!) 102   Temp 98 F (36.7 C) (Oral)   Resp (!) 21   Ht 6\' 2"  (1.88 m)   Wt 93.5 kg   SpO2 100%   BMI 26.47 kg/m  , BMI Body mass index is 26.47 kg/m. GENERAL:  Well appearing HEENT: Pupils equal round and reactive, fundi not visualized, oral mucosa unremarkable NECK:  No jugular venous distention, waveform within normal limits, carotid upstroke brisk and symmetric, no bruits, no thyromegaly LYMPHATICS:  No cervical adenopathy LUNGS:  Clear to auscultation bilaterally HEART:  Irregularly irregular. Marland Kitchen  PMI not displaced or sustained,S1 and  S2 within normal limits, no S3, no S4, no clicks, no rubs, no murmurs ABD:  Flat, positive bowel sounds normal in frequency in pitch, no bruits, no rebound, no guarding, no midline pulsatile mass, no hepatomegaly, no splenomegaly EXT:  2 plus pulses throughout, no edema, no cyanosis no clubbing.  R TMA. SKIN:  No rashes no nodules NEURO:  Cranial nerves II through XII grossly intact, motor grossly intact throughout [facility]:  Cognitively intact, oriented to person place and time    Labs    Chemistry Recent Labs  Lab 04/04/18 1429 04/05/18 0315 04/06/18 0256 04/07/18 0403 04/07/18 2316  NA 133* 135 135 137 133*  K 4.1 3.7 3.8 4.4 4.3  CL 95* 95* 93* 96* 93*  CO2 22 27 23 28 24   GLUCOSE 129* 146* 147* 152* 185*  BUN 74* 31* 49* 30* 43*  CREATININE 10.34* 6.13* 8.53* 6.42* 7.98*  CALCIUM 8.7* 8.5* 8.5* 8.6* 8.5*  ALBUMIN 1.7* 1.8*  --   --  1.7*  GFRNONAA 5* 9*  6* 8* 6*  GFRAA 5* 10* 7* 10* 7*  ANIONGAP 16* 13 19* 13 16*     Hematology Recent Labs  Lab 04/06/18 0256 04/07/18 0403 04/07/18 2316  WBC 9.6 10.1 10.6*  RBC 3.77* 3.66* 3.41*  HGB 10.3* 10.3* 9.6*  HCT 33.4* 32.6* 30.3*  MCV 88.6 89.1 88.9  MCH 27.3 28.1 28.2  MCHC 30.8 31.6 31.7  RDW 15.0 15.1 15.0  PLT 236 243 232    Cardiac EnzymesNo results for input(s): TROPONINI in the last 168 hours. No results for input(s): TROPIPOC in the last 168 hours.   BNPNo results for input(s): BNP, PROBNP in the last 168 hours.   DDimer No results for input(s): DDIMER in the last 168 hours.   Radiology    No results found.  Cardiac Studies   Echocardiogram 11/2016: Study Conclusions: - Left ventricle: The cavity size was normal. Wall thickness was increased in a pattern of mild LVH. Systolic function was normal. The estimated ejection fraction was in the range of 50% to 55%. Wall motion was normal; there were no regional wall motion abnormalities. The study was not technically sufficient to allow evaluation of LV diastolic dysfunction due to atrial fibrillation. Indeterminate filling pressures. - Aorta: Mild aortic root dilatation. Diameter 4 cm. - Mitral valve: Calcified annulus. Normal thickness leaflets . - Left atrium: The atrium was moderately dilated. - Right ventricle: The cavity size was mildly dilated. Systolic function was mildly reduced. - Right atrium: The atrium was mildly dilated. - Tricuspid valve: There was mild regurgitation.  Patient Profile      Mr. Sciandra is a 76M with persistent atrial fibrillation, hypertension, hyperlipidemia, diabetes, ESRD on HD and PAD with chronic limb ischemia here for L AKA vs. BKA.   Assessment & Plan    # Persistent atrial fibrillation: Rate control has been limited by hypotension.  BP improved after starting midodrine this admission.  Carvedilol was switched to metoprolol and he has continued on diltiazem.   Rate control is better today after adding amiodarone yesterday.  He will go to the OR today.  His Coumadin has been reversed and he has been on heparin.  # Hypotension: Improved on midodrine.  ESRD on HD: per nephrology  # PAD: History of chronic limb ischemia.  Going for L AKA vs BKA today.      For questions or updates, please contact [place] Please consult [email or web address] for contact info under  Signed, Skeet Latch, MD  04/08/2018, 11:18 AM

## 2018-04-08 NOTE — Progress Notes (Signed)
ANTICOAGULATION CONSULT NOTE  Pharmacy Consult for Heparin  Indication: atrial fibrillation  Patient Measurements: Height: 6\' 2"  (188 cm) Weight: 203 lb 14.8 oz (92.5 kg) IBW/kg (Calculated) : 82.2  Vital Signs: Temp: 98.8 F (37.1 C) (02/14 2027) Temp Source: Oral (02/14 2027) BP: 113/74 (02/14 2113) Pulse Rate: 91 (02/14 2027)  Labs: Recent Labs    04/05/18 0315 04/06/18 0256 04/07/18 0403 04/07/18 2316  HGB 10.1* 10.3* 10.3* 9.6*  HCT 33.0* 33.4* 32.6* 30.3*  PLT 255 236 243 232  LABPROT 37.4* 31.5* 18.7*  --   INR 3.87 3.10 1.59  --   HEPARINUNFRC  --   --   --  0.10*  CREATININE 6.13* 8.53* 6.42*  --     Estimated Creatinine Clearance: 13.5 mL/min (A) (by C-G formula based on SCr of 6.42 mg/dL (H)).  Assessment: 63 year old male on warfarin prior to admission for Afib currently on hold for surgery. Surgery cancelled for 2/14 but rescheduled for today. Pt is being bridged with heparin. Initial heparin level is low.   Goal of Therapy:  Heparin level 0.3-0.7 units/ml Monitor platelets by anticoagulation protocol: Yes   Plan:  Increase heparin gtt to 1500 units/hr Check an 8 hr heparin level vs f/u post-op  Salome Arnt, PharmD, BCPS Please see AMION for all pharmacy numbers 04/08/2018 12:13 AM

## 2018-04-08 NOTE — Progress Notes (Signed)
HD tx initiated via 15Gx2 w/o problem Pull/push/flush well w/o problem VSS Will continue to monitor while on HD tx 

## 2018-04-08 NOTE — Progress Notes (Signed)
PROGRESS NOTE    Albert [name]  UUV:[id number] DOB: Aug 26, 1955 DOA: 03/20/2018 PCP: Redmond School, MD   Brief Narrative: 63 year old male with history of chronic A. fib on Coumadin, diabetes mellitus type 2, ESRD on HD TTS  with left lower extremity cellulitis, PVD. Patient was admitted at [facility] on 03/20/2018 with cellulitis of the lower extremity after presenting from home with a left heel discomfort.  ABI was performed that showed compromised blood flow.  X-ray of the heel did not show osteomyelitis.  Patient was transferred to [facility] is going for arteriogram after INR was acceptable.  Underwent arteriogram on 2/3 and on 2/5 had left transmetatarsal of 2nd toe and transmetatarsal amputation of 1 2 and 3 right toes.  Patient has been in A. fib intermittently hypotensive and rate poorly controlled.  Cardiology consulted 2/12.  Subjective: Resting.  Heart rate fairly controlled in 90s 200 on amiodarone drip.  Scheduled for amputation of the left leg today.  No Complaints overnight.  Denies nausea vomiting chest pain, fever or chills. Assessment & Plan:   Cellulitis of left lower extremity/Bilateral dry gangrenous toes/severe bilateral peripheral vascular disease: Status post left second toe transmetatarsal amputation and right 1, 2 and 3 transmetatarsal amputation. Vascular surgery plans to do  left below versus above-knee amputation today due to nonhealing wound as well as poor circulation. OR canceled 2/14 today due to uncontrolled heart rate. Patient completed Ancef 2/9.    Chronic Atrial fibrillation:Rate fairly controlled on amiodarone drip. Coreg has been discontinued and switched to metoprolol due to soft BP.Cardiology on board-INR reversed for OR-and now on heparin drip. No history of stroke, CHADS2VASC is 3. Cont on metoprolol 50 bid, Cardizem 180 mg.  Hypotension, in the setting of A. fib.  Patient is placed on midodrine now. Essential hypertension, benign : Blood  pressure SOFT continue on midodrine started on 2/12. Pt on Cardizem and Lopressor for A. fib rate control.   ESRD on HD: Nephrology on board, appreciate input. Last HD 2/13-for HD today likely post surgery  Anemia of chronic renal disease, continue with ESA, hemoglobin stable  Diabetes mellitus : Non-insulin-dependent.  Home tradjenta and glipizide on hold.Blood sugar well controlled on sliding scale insulin.  Monitor glucose. Recent Labs  Lab 04/07/18 0630 04/07/18 1246 04/07/18 1657 04/07/18 2120 04/08/18 0621  GLUCAP 166* 148* 143* 127* 141*   DVT prophylaxis: heparin Code Status: full Family Communication: none at bedside  Disposition Plan: Remains inpatient pending clinical improvement. Per vascular surgery will need amputation soon.  Patient will need CIR placement eventually.   Consultants:  Nephrology Vascular surgery Cardiology  Procedures:  HD  03/27/18 Arteriogram with balloon angioplasty of the left posterior tibial artery Aorta and iliac segments are heavily calcified and tortuous however there is no flow-limiting stenosis.  SFAs are heavily calcified no flow-limiting stenosis.  Right side appears to have runoff via the posterior tibial and anterior tibial arteries without flow-limiting stenosis.  Left side has runoff via the anterior tibial and peroneal posterior tibial occludes above the ankle.  After intervention there is less than 30% residual stenosis and a strong signal at the ankle.Patient will need amputation of bilateral second toes at least and we will watch the ulceration of his heel on the left but he remains high risk for proximal leg amputations bilaterally.  03/29/18 1. left transmetatarsal amputation second toe. 2. Large amount of necrotic debris and gangrenous tissue toes 1 2 and 3 right foot requiring Transmetatarsal amputation of these rather than  isolated second toe amputation.  Antimicrobials: Anti-infectives (From admission, onward)   Start      Dose/Rate Route Frequency Ordered Stop   04/07/18 0600  ceFAZolin (ANCEF) IVPB 2g/100 mL premix     2 g 200 mL/hr over 30 Minutes Intravenous To Short Stay 04/06/18 1510 04/08/18 0600   03/20/18 2200  ceFAZolin (ANCEF) IVPB 1 g/50 mL premix  Status:  Discontinued     1 g 100 mL/hr over 30 Minutes Intravenous Every 24 hours 03/20/18 2112 04/02/18 0917       Objective: Vitals:   04/08/18 0623 04/08/18 0803 04/08/18 0822 04/08/18 0823  BP:  124/69 124/69 124/69  Pulse:  (!) 102  (!) 102  Resp: (!) 21     Temp:  98 F (36.7 C)    TempSrc:  Oral    SpO2: 100%     Weight: 93.5 kg     Height:        Intake/Output Summary (Last 24 hours) at 04/08/2018 0850 Last data filed at 04/08/2018 0703 Gross per 24 hour  Intake 538.6 ml  Output 0 ml  Net 538.6 ml   Filed Weights   04/06/18 1128 04/07/18 0321 04/08/18 0623  Weight: 94 kg 92.5 kg 93.5 kg   Weight change: -0.9 kg  Body mass index is 26.47 kg/m.  Intake/Output from previous day: 02/14 0701 - 02/15 0700 In: 538.6 [P.O.:300; I.V.:238.6] Out: -  Intake/Output this shift: No intake/output data recorded.  Examination: General exam: Calm, comfortable, not in acute distress, older for age, average built.  HEENT:Oral mucosa moist, Ear/Nose WNL grossly, dentition normal. Respiratory system: Bilateral equal air entry, no crackles and wheezing, no use of accessory muscle, non tender on palpation. Cardiovascular system: irregular rate and rhythm, S1 & S2 heard, No JVD/murmurs. Gastrointestinal system: Abdomen soft, non-tender, non-distended, BS +. No hepatosplenomegaly palpable. Nervous System:Alert, awake and oriented at baseline. Able to move UE and LE, sensation intact. Extremities: No edema, distal peripheral pulses palpable.cool lower extremities, right 1-3 transmetatarsal amputation wound healing, left second toe amputation, left ankle with area of skin discoloration/foul smell  Skin: No rashes,no icterus. MSK: Normal muscle  bulk,tone, power   Medications:  Scheduled Meds: . allopurinol  100 mg Oral Daily  . aspirin EC  81 mg Oral Daily  . Chlorhexidine Gluconate Cloth  6 each Topical Q0600  . Chlorhexidine Gluconate Cloth  6 each Topical Q0600  . cinacalcet  30 mg Oral Q supper  . darbepoetin (ARANESP) injection - DIALYSIS  100 mcg Intravenous Q Sat-HD  . diltiazem  180 mg Oral Daily  . doxercalciferol  3.5 mcg Intravenous Q T,Th,Sa-HD  . insulin aspart  0-9 Units Subcutaneous TID WC  . metoprolol tartrate  50 mg Oral BID  . midodrine  10 mg Oral TID WC  . pantoprazole  40 mg Oral Daily  . pravastatin  5 mg Oral QPM  . senna-docusate  1 tablet Oral BID  . sevelamer carbonate  1,600 mg Oral With snacks  . sevelamer carbonate  3,200 mg Oral TID WC  . sodium chloride flush  3 mL Intravenous Q12H  . Warfarin - Pharmacist Dosing Inpatient   Does not apply q1800   Continuous Infusions: . sodium chloride    . sodium chloride 10 mL/hr at 04/07/18 1241  . amiodarone 30 mg/hr (04/08/18 0053)  . heparin 1,500 Units/hr (04/08/18 0029)    Data Reviewed: I have personally reviewed following labs and imaging studies  CBC: Recent Labs  Lab 04/04/18 1431  04/05/18 0315 04/06/18 0256 04/07/18 0403 04/07/18 2316  WBC 14.0* 13.4* 9.6 10.1 10.6*  HGB 10.1* 10.1* 10.3* 10.3* 9.6*  HCT 31.6* 33.0* 33.4* 32.6* 30.3*  MCV 87.5 87.5 88.6 89.1 88.9  PLT 252 255 236 243 465   Basic Metabolic Panel: Recent Labs  Lab 04/01/18 1302 04/04/18 0401 04/04/18 1429 04/05/18 0315 04/06/18 0256 04/07/18 0403 04/07/18 2316  NA 133*  --  133* 135 135 137 133*  K 4.2  --  4.1 3.7 3.8 4.4 4.3  CL 92*  --  95* 95* 93* 96* 93*  CO2 24  --  22 27 23 28 24   GLUCOSE 145*  --  129* 146* 147* 152* 185*  BUN 54*  --  74* 31* 49* 30* 43*  CREATININE 8.51*  --  10.34* 6.13* 8.53* 6.42* 7.98*  CALCIUM 8.7*  --  8.7* 8.5* 8.5* 8.6* 8.5*  MG  --  1.9  --   --   --   --   --   PHOS 5.2*  --  4.2 4.1  --   --  4.5    GFR: Estimated Creatinine Clearance: 10.9 mL/min (A) (by C-G formula based on SCr of 7.98 mg/dL (H)). Liver Function Tests: Recent Labs  Lab 04/01/18 1302 04/04/18 1429 04/05/18 0315 04/07/18 2316  ALBUMIN 1.8* 1.7* 1.8* 1.7*   No results for input(s): LIPASE, AMYLASE in the last 168 hours. No results for input(s): AMMONIA in the last 168 hours. Coagulation Profile: Recent Labs  Lab 04/03/18 0344 04/04/18 0401 04/05/18 0315 04/06/18 0256 04/07/18 0403  INR 3.54 3.73 3.87 3.10 1.59   Cardiac Enzymes: No results for input(s): CKTOTAL, CKMB, CKMBINDEX, TROPONINI in the last 168 hours. BNP (last 3 results) No results for input(s): PROBNP in the last 8760 hours. HbA1C: No results for input(s): HGBA1C in the last 72 hours. CBG: Recent Labs  Lab 04/07/18 0630 04/07/18 1246 04/07/18 1657 04/07/18 2120 04/08/18 0621  GLUCAP 166* 148* 143* 127* 141*   Lipid Profile: No results for input(s): CHOL, HDL, LDLCALC, TRIG, CHOLHDL, LDLDIRECT in the last 72 hours. Thyroid Function Tests: No results for input(s): TSH, T4TOTAL, FREET4, T3FREE, THYROIDAB in the last 72 hours. Anemia Panel: No results for input(s): VITAMINB12, FOLATE, FERRITIN, TIBC, IRON, RETICCTPCT in the last 72 hours. Sepsis Labs: No results for input(s): PROCALCITON, LATICACIDVEN in the last 168 hours.  No results found for this or any previous visit (from the past 240 hour(s)).    Radiology Studies: No results found.    LOS: 19 days   Time spent: More than 50% of that time was spent in counseling and/or coordination of care.  Antonieta Pert, MD [facility]  04/08/2018, 8:50 AM

## 2018-04-08 NOTE — Progress Notes (Signed)
HD tx completed @ 2305 w/o problem UF goal met Blood rinsed back VSS Report called to Doree Albee, RN

## 2018-04-08 NOTE — Op Note (Signed)
Procedure: Left below-knee amputation  Preoperative diagnosis: Gangrene left foot  Postoperative diagnosis: Same  Anesthesia Gen.  Assistant: Leontine Locket PA-C  Upper findings: #1 severely calcified vessels with reasonably well perfused muscle tissue  Operative details: After obtaining informed consent, the patient was taken to the operating room. The patient was placed in supine position on the operating table. After induction of general anesthesia and endotracheal intubation, the patient's entire right lower extremity was prepped and draped all the way down to the level of the ankle. Next a transverse incision was made approximately 4 fingerbreadths below the tibial tuberosity on the right leg.  The  incision was carried posteriorly to the midportion of the leg and then extended longitudinally to create a posterior flap. The subcutaneous tissues and fascia was taken down with cautery. Periosteum was raised on the tibia approximately 5 cm above the skin edge.  The periosteum was also raised on the fibula several centimeters above this. The tibia was then divided with a saw. The fibula was divided with a bone cutter. The leg was then elevated in the operative field and the amputation was completed posterior to the bones with an amputation knife. Hemostasis was then obtained with cautery and several suture ligatures. The vessels were severely calcified requiring multiple suture ligatures.  The tibial and sural nerves were pulled down into the field transected and allowed to retract up into the leg.  After hemostasis was obtained, the wound was thoroughly irrigated with  normal saline solution. The fascial edges were then reapproximated with interrupted 2-0 Vicryl sutures. Subcutaneous tissues were reapproximated using running 3-0 Vicryl suture. The skin was closed with staples. The patient tolerated the procedure well and there were no complications. Instrument sponge and  needle counts were correct at  the end of the case. The patient was taken to the recovery room in stable condition.  Ruta Hinds, MD [facility] Office: [phone] Pager: [phone]

## 2018-04-08 NOTE — Anesthesia Postprocedure Evaluation (Signed)
Anesthesia Post Note  Patient: DEBBIE BELLUCCI  Procedure(s) Performed: LEFT BELOW KNEE AMPUTATION (Left )     Patient location during evaluation: PACU Anesthesia Type: General Level of consciousness: awake and alert Pain management: pain level controlled Vital Signs Assessment: post-procedure vital signs reviewed and stable Respiratory status: spontaneous breathing, nonlabored ventilation, respiratory function stable and patient connected to nasal cannula oxygen Cardiovascular status: blood pressure returned to baseline and stable Postop Assessment: no apparent nausea or vomiting Anesthetic complications: no    Last Vitals:  Vitals:   04/08/18 1545 04/08/18 1555  BP: 104/73   Pulse: 79 85  Resp: 20   Temp:  36.5 C  SpO2: 98%     Last Pain:  Vitals:   04/08/18 1555  TempSrc: Oral  PainSc: 0-No pain                 Bryelle Spiewak,W. EDMOND

## 2018-04-08 NOTE — Progress Notes (Signed)
Incision on right foot oozing blood. Looks like a clot on incision. Clean and applied dry gauze dressing.

## 2018-04-08 NOTE — Progress Notes (Signed)
Pt received from PACU. Vitals stable. Pt denies any pain. CCMD notified/telebox 14 applied. Wrap applied to L BKA no drainage noted. Jerald Kief, RN

## 2018-04-08 NOTE — Transfer of Care (Signed)
Immediate Anesthesia Transfer of Care Note  Patient: Albert [name]  Procedure(s) Performed: LEFT BELOW KNEE AMPUTATION (Left )  Patient Location: PACU  Anesthesia Type:General  Level of Consciousness: drowsy, patient cooperative and responds to stimulation  Airway & Oxygen Therapy: Patient Spontanous Breathing and Patient connected to face mask oxygen  Post-op Assessment: Report given to RN and Post -op Vital signs reviewed and stable  Post vital signs: Reviewed and stable  Last Vitals:  Vitals Value Taken Time  BP 108/68 04/08/2018  2:23 PM  Temp    Pulse 78 04/08/2018  2:29 PM  Resp 22 04/08/2018  2:29 PM  SpO2 100 % 04/08/2018  2:29 PM  Vitals shown include unvalidated device data.  Last Pain:  Vitals:   04/08/18 0803  TempSrc: Oral  PainSc: Asleep      Patients Stated Pain Goal: 0 (02/89/02 2840)  Complications: No apparent anesthesia complications

## 2018-04-08 NOTE — Anesthesia Procedure Notes (Addendum)
Procedure Name: Intubation Date/Time: 04/08/2018 12:45 PM Performed by: Jearld Pies, CRNA Pre-anesthesia Checklist: Patient identified, Emergency Drugs available, Suction available and Patient being monitored Patient Re-evaluated:Patient Re-evaluated prior to induction Oxygen Delivery Method: Circle System Utilized Preoxygenation: Pre-oxygenation with 100% oxygen Induction Type: IV induction Ventilation: Two handed mask ventilation required, Oral airway inserted - appropriate to patient size and Mask ventilation with difficulty Laryngoscope Size: Miller and 2 Grade View: Grade II Tube type: Oral Tube size: 7.5 mm Number of attempts: 2 Airway Equipment and Method: Stylet and Oral airway Placement Confirmation: ETT inserted through vocal cords under direct vision,  positive ETCO2 and breath sounds checked- equal and bilateral Secured at: 22 cm Tube secured with: Tape Dental Injury: Teeth and Oropharynx as per pre-operative assessment  Comments: DL x 1 with MAC 4 - G3v - esophageal intubation, removed ETT, mask ventilated, DL with Miller 2, G2bv - + intubation

## 2018-04-09 ENCOUNTER — Encounter (HOSPITAL_COMMUNITY): Payer: Self-pay | Admitting: Vascular Surgery

## 2018-04-09 LAB — GLUCOSE, CAPILLARY
Glucose-Capillary: 116 mg/dL — ABNORMAL HIGH (ref 70–99)
Glucose-Capillary: 152 mg/dL — ABNORMAL HIGH (ref 70–99)
Glucose-Capillary: 169 mg/dL — ABNORMAL HIGH (ref 70–99)
Glucose-Capillary: 173 mg/dL — ABNORMAL HIGH (ref 70–99)
Glucose-Capillary: 223 mg/dL — ABNORMAL HIGH (ref 70–99)

## 2018-04-09 LAB — BASIC METABOLIC PANEL
Anion gap: 16 — ABNORMAL HIGH (ref 5–15)
BUN: 23 mg/dL (ref 8–23)
CALCIUM: 8.3 mg/dL — AB (ref 8.9–10.3)
CO2: 24 mmol/L (ref 22–32)
Chloride: 96 mmol/L — ABNORMAL LOW (ref 98–111)
Creatinine, Ser: 5 mg/dL — ABNORMAL HIGH (ref 0.61–1.24)
GFR calc Af Amer: 13 mL/min — ABNORMAL LOW (ref >60)
GFR calc non Af Amer: 11 mL/min — ABNORMAL LOW (ref >60)
GLUCOSE: 208 mg/dL — AB (ref 70–99)
Potassium: 4.2 mmol/L (ref 3.5–5.1)
Sodium: 136 mmol/L (ref 135–145)

## 2018-04-09 LAB — CBC
HCT: 28.6 % — ABNORMAL LOW (ref 39.0–52.0)
Hemoglobin: 9.2 g/dL — ABNORMAL LOW (ref 13.0–17.0)
MCH: 28.6 pg (ref 26.0–34.0)
MCHC: 32.2 g/dL (ref 30.0–36.0)
MCV: 88.8 fL (ref 80.0–100.0)
NRBC: 0 % (ref 0.0–0.2)
Platelets: 211 10*3/uL (ref 150–400)
RBC: 3.22 MIL/uL — ABNORMAL LOW (ref 4.22–5.81)
RDW: 15 % (ref 11.5–15.5)
WBC: 15 10*3/uL — ABNORMAL HIGH (ref 4.0–10.5)

## 2018-04-09 LAB — HEPARIN LEVEL (UNFRACTIONATED)
Heparin Unfractionated: 0.22 IU/mL — ABNORMAL LOW (ref 0.30–0.70)
Heparin Unfractionated: 0.34 IU/mL (ref 0.30–0.70)

## 2018-04-09 MED ORDER — AMIODARONE HCL 200 MG PO TABS
400.0000 mg | ORAL_TABLET | Freq: Two times a day (BID) | ORAL | Status: AC
  Administered 2018-04-09 – 2018-04-13 (×9): 400 mg via ORAL
  Filled 2018-04-09 (×10): qty 2

## 2018-04-09 MED ORDER — AMIODARONE HCL 200 MG PO TABS
200.0000 mg | ORAL_TABLET | Freq: Every day | ORAL | Status: DC
  Administered 2018-04-14: 200 mg via ORAL
  Filled 2018-04-09: qty 1

## 2018-04-09 MED ORDER — WARFARIN SODIUM 5 MG PO TABS: 5.0000 mg | ORAL_TABLET | Freq: Once | ORAL | Status: DC

## 2018-04-09 NOTE — Progress Notes (Signed)
PROGRESS NOTE    Albert [name]  IPJ:[id number] DOB: 30-May-1955 DOA: 03/20/2018 PCP: Redmond School, MD   Brief Narrative: 63 year old male with history of chronic A. fib on Coumadin, diabetes mellitus type 2, ESRD on HD TTS  with left lower extremity cellulitis, PVD. Patient was admitted at [facility] on 03/20/2018 with cellulitis of the lower extremity after presenting from home with a left heel discomfort.  ABI was performed that showed compromised blood flow.  X-ray of the heel did not show osteomyelitis.  Patient was transferred to [facility] is going for arteriogram after INR was acceptable.  Underwent arteriogram on 2/3 and on 2/5 had left transmetatarsal of 2nd toe and transmetatarsal amputation of 1 2 and 3 right toes.  Patient has been in A. fib intermittently hypotensive and rate poorly controlled.  Cardiology consulted 2/12. Patient Coumadin was reversed for surgery and placed on heparin drip. underwent successful left BKA on 2/15. He will need inpatient rehab moving forward, he lives alone at home.  Subjective: Patient resting.  Complains of some hiccups since surgery.  Rate fairly controlled. Resting comfortably. Denies chest pain shortness of air nausea vomiting.  Assessment & Plan:   Cellulitis of left lower extremity/Bilateral dry gangrenous toes/severe bilateral peripheral vascular disease: Status post left second toe transmetatarsal amputation and right 1, 2 and 3 transmetatarsal amputation. Vascular surgery plans to do  left below versus above-knee amputation today due to nonhealing wound as well as poor circulation. OR canceled 2/14 today due to uncontrolled heart rate. Patient completed Ancef 2/9.  Chronic Atrial fibrillation:Rate now controlled on amiodarone.  Per cardiology being switched TO PO.  Coreg has been discontinued and switched to metoprolol due to soft BP.Cardiology on board-INR reversed for OR and on heparin drip. No history of stroke, CHADS2VASC is 3.   Will resume Coumadin if okay with vascular surgery, I have page Dr. Oneida Alar this am.  Hypotension, in the setting of A. fib.  Patient is placed on midodrine now.  Pressure controlLED. Essential hypertension, benign : Blood pressure stable on midodrine  SINCE 2/12. Pt on Cardizem and Lopressor for A. fib rate control.   ESRD on HD: Nephrology on board, appreciate input. Last HD 2/15  Anemia of chronic renal disease, continue with ESA, hemoglobin stable Recent Labs  Lab 04/05/18 0315 04/06/18 0256 04/07/18 0403 04/07/18 2316 04/09/18 0406  HGB 10.1* 10.3* 10.3* 9.6* 9.2*  HCT 33.0* 33.4* 32.6* 30.3* 28.6*    Diabetes mellitus : Non-insulin-dependent.  Home tradjenta and glipizide on hold.Blood sugar well controlled as below.  Continue ssi and monitor. Recent Labs  Lab 04/08/18 1048 04/08/18 1426 04/08/18 1634 04/09/18 0017 04/09/18 0630  GLUCAP 135* 164* 200* 116* 152*   DVT prophylaxis: heparin Code Status: full Family Communication: none at bedside  Disposition Plan: Remains inpatient pending clinical improvement.  CIR on consult   Consultants:  Nephrology Vascular surgery Cardiology  Procedures:  03/27/18 Arteriogram with balloon angioplasty of the left posterior tibial artery Aorta and iliac segments are heavily calcified and tortuous however there is no flow-limiting stenosis.  SFAs are heavily calcified no flow-limiting stenosis.  Right side appears to have runoff via the posterior tibial and anterior tibial arteries without flow-limiting stenosis.  Left side has runoff via the anterior tibial and peroneal posterior tibial occludes above the ankle.  After intervention there is less than 30% residual stenosis and a strong signal at the ankle.Patient will need amputation of bilateral second toes at least and we will watch the ulceration  of his heel on the left but he remains high risk for proximal leg amputations bilaterally.  03/29/18 1. left transmetatarsal amputation  second toe. 2. Large amount of necrotic debris and gangrenous tissue toes 1 2 and 3 right foot requiring Transmetatarsal amputation of these rather than isolated second toe amputation.  04/08/18 LT BKA  Antimicrobials: Anti-infectives (From admission, onward)   Start     Dose/Rate Route Frequency Ordered Stop   04/09/18 0900  ceFAZolin (ANCEF) IVPB 1 g/50 mL premix     1 g 100 mL/hr over 30 Minutes Intravenous Every 24 hours 04/08/18 1614 04/09/18 0940   04/08/18 1130  ceFAZolin (ANCEF) IVPB 2g/100 mL premix     2 g 200 mL/hr over 30 Minutes Intravenous To Short Stay 04/08/18 1118 04/08/18 1251   04/08/18 1120  ceFAZolin (ANCEF) 2-4 GM/100ML-% IVPB    Note to Pharmacy:  Henrine Screws   : cabinet override      04/08/18 1120 04/08/18 1251   04/07/18 0600  ceFAZolin (ANCEF) IVPB 2g/100 mL premix     2 g 200 mL/hr over 30 Minutes Intravenous To Short Stay 04/06/18 1510 04/08/18 0600   03/20/18 2200  ceFAZolin (ANCEF) IVPB 1 g/50 mL premix  Status:  Discontinued     1 g 100 mL/hr over 30 Minutes Intravenous Every 24 hours 03/20/18 2112 04/02/18 0917       Objective: Vitals:   04/09/18 0700 04/09/18 0856 04/09/18 0859 04/09/18 0900  BP:  123/67 123/67 123/67  Pulse: 97  90 95  Resp: (!) 26   19  Temp:    97.6 F (36.4 C)  TempSrc:    Oral  SpO2:    95%  Weight:      Height:        Intake/Output Summary (Last 24 hours) at 04/09/2018 1034 Last data filed at 04/09/2018 0730 Gross per 24 hour  Intake 1759.55 ml  Output 700 ml  Net 1059.55 ml   Filed Weights   04/08/18 0623 04/08/18 1842 04/08/18 2333  Weight: 93.5 kg 92.8 kg 92.3 kg   Weight change: -0.7 kg  Body mass index is 26.13 kg/m.  Intake/Output from previous day: 02/15 0701 - 02/16 0700 In: 1519.6 [I.V.:1169.6; IV Piggyback:350] Out: 700 [Blood:200] Intake/Output this shift: Total I/O In: 240 [P.O.:240] Out: -   Examination: General exam: Calm, comfortable, not in acute distress, older for age, average  built.  HEENT:Oral mucosa moist, Ear/Nose WNL grossly, dentition normal. Respiratory system: Bilateral equal air entry, no crackles and wheezing, no use of accessory muscle, non tender on palpation. Cardiovascular system: irregular rate and rhythm, S1 & S2 heard, No JVD/murmurs. Gastrointestinal system: Abdomen soft, non-tender, non-distended, BS +. No hepatosplenomegaly palpable. Nervous System:Alert, awake and oriented at baseline. Able to move UE and LE, sensation intact. Extremities: Left BKA, stump in dressing, right transmetatarsal amputation wound is healing, no drainage.   Skin: No rashes,no icterus. MSK: Normal muscle bulk,tone, power  Medications:  Scheduled Meds: . allopurinol  100 mg Oral Daily  . [START ON 04/14/2018] amiodarone  200 mg Oral Daily  . amiodarone  400 mg Oral BID  . aspirin EC  81 mg Oral Daily  . Chlorhexidine Gluconate Cloth  6 each Topical Q0600  . Chlorhexidine Gluconate Cloth  6 each Topical Q0600  . cinacalcet  30 mg Oral Q supper  . darbepoetin (ARANESP) injection - DIALYSIS  100 mcg Intravenous Q Sat-HD  . diltiazem  180 mg Oral Daily  . doxercalciferol  3.5  mcg Intravenous Q T,Th,Sa-HD  . insulin aspart  0-9 Units Subcutaneous TID WC  . metoprolol tartrate  50 mg Oral BID  . midodrine  10 mg Oral TID WC  . pantoprazole  40 mg Oral Daily  . pravastatin  5 mg Oral QPM  . senna-docusate  1 tablet Oral BID  . sevelamer carbonate  1,600 mg Oral With snacks  . sevelamer carbonate  3,200 mg Oral TID WC  . sodium chloride flush  3 mL Intravenous Q12H   Continuous Infusions: . sodium chloride    . sodium chloride    . sodium chloride    . sodium chloride 10 mL/hr at 04/09/18 0909  . heparin 1,650 Units/hr (04/09/18 0927)    Data Reviewed: I have personally reviewed following labs and imaging studies  CBC: Recent Labs  Lab 04/05/18 0315 04/06/18 0256 04/07/18 0403 04/07/18 2316 04/09/18 0406  WBC 13.4* 9.6 10.1 10.6* 15.0*  HGB 10.1* 10.3*  10.3* 9.6* 9.2*  HCT 33.0* 33.4* 32.6* 30.3* 28.6*  MCV 87.5 88.6 89.1 88.9 88.8  PLT 255 236 243 232 275   Basic Metabolic Panel: Recent Labs  Lab 04/04/18 0401  04/04/18 1429 04/05/18 0315 04/06/18 0256 04/07/18 0403 04/07/18 2316 04/09/18 0406  NA  --    < > 133* 135 135 137 133* 136  K  --    < > 4.1 3.7 3.8 4.4 4.3 4.2  CL  --    < > 95* 95* 93* 96* 93* 96*  CO2  --    < > 22 27 23 28 24 24   GLUCOSE  --    < > 129* 146* 147* 152* 185* 208*  BUN  --    < > 74* 31* 49* 30* 43* 23  CREATININE  --    < > 10.34* 6.13* 8.53* 6.42* 7.98* 5.00*  CALCIUM  --    < > 8.7* 8.5* 8.5* 8.6* 8.5* 8.3*  MG 1.9  --   --   --   --   --   --   --   PHOS  --   --  4.2 4.1  --   --  4.5  --    < > = values in this interval not displayed.   GFR: Estimated Creatinine Clearance: 17.4 mL/min (A) (by C-G formula based on SCr of 5 mg/dL (H)). Liver Function Tests: Recent Labs  Lab 04/04/18 1429 04/05/18 0315 04/07/18 2316  ALBUMIN 1.7* 1.8* 1.7*   No results for input(s): LIPASE, AMYLASE in the last 168 hours. No results for input(s): AMMONIA in the last 168 hours. Coagulation Profile: Recent Labs  Lab 04/03/18 0344 04/04/18 0401 04/05/18 0315 04/06/18 0256 04/07/18 0403  INR 3.54 3.73 3.87 3.10 1.59   Cardiac Enzymes: No results for input(s): CKTOTAL, CKMB, CKMBINDEX, TROPONINI in the last 168 hours. BNP (last 3 results) No results for input(s): PROBNP in the last 8760 hours. HbA1C: No results for input(s): HGBA1C in the last 72 hours. CBG: Recent Labs  Lab 04/08/18 1048 04/08/18 1426 04/08/18 1634 04/09/18 0017 04/09/18 0630  GLUCAP 135* 164* 200* 116* 152*   Lipid Profile: No results for input(s): CHOL, HDL, LDLCALC, TRIG, CHOLHDL, LDLDIRECT in the last 72 hours. Thyroid Function Tests: No results for input(s): TSH, T4TOTAL, FREET4, T3FREE, THYROIDAB in the last 72 hours. Anemia Panel: No results for input(s): VITAMINB12, FOLATE, FERRITIN, TIBC, IRON, RETICCTPCT in  the last 72 hours. Sepsis Labs: No results for input(s): PROCALCITON, LATICACIDVEN in the last  168 hours.  No results found for this or any previous visit (from the past 240 hour(s)).    Radiology Studies: No results found.    LOS: 20 days   Time spent: More than 50% of that time was spent in counseling and/or coordination of care.  Antonieta Pert, MD [facility]  04/09/2018, 10:34 AM

## 2018-04-09 NOTE — Progress Notes (Signed)
ANTICOAGULATION CONSULT NOTE - Follow-Up  Pharmacy Consult for Heparin  Indication: atrial fibrillation  Patient Measurements: Height: 6\' 2"  (188 cm) Weight: 203 lb 7.8 oz (92.3 kg) IBW/kg (Calculated) : 82.2  Vital Signs: Temp: 97.6 F (36.4 C) (02/16 0900) Temp Source: Oral (02/16 0900) BP: 123/67 (02/16 0900) Pulse Rate: 95 (02/16 0900)  Labs: Recent Labs    04/07/18 0403 04/07/18 2316 04/08/18 1606 04/09/18 0406 04/09/18 0802  HGB 10.3* 9.6*  --  9.2*  --   HCT 32.6* 30.3*  --  28.6*  --   PLT 243 232  --  211  --   LABPROT 18.7*  --   --   --   --   INR 1.59  --   --   --   --   HEPARINUNFRC  --  0.10* <0.10*  --  0.22*  CREATININE 6.42* 7.98*  --  5.00*  --     Estimated Creatinine Clearance: 17.4 mL/min (A) (by C-G formula based on SCr of 5 mg/dL (H)).  Assessment: 63 year old male on warfarin prior to admission for Afib currently on hold post BKA 2/15.  Pt is being bridged with heparin. Heparin level remains low but trending up.   Goal of Therapy:  Heparin level 0.3-0.7 units/ml Monitor platelets by anticoagulation protocol: Yes   Plan:  Increase heparin gtt to 1650 units/hr Check an 8 hr heparin level  Follow-up on plans to resume warfarin  [employer], Pharm.D., BCPS Clinical Pharmacist  **Pharmacist phone directory can now be found on [email or web address] ([email or web address]).  Listed under [place].  04/09/2018 9:17 AM

## 2018-04-09 NOTE — Evaluation (Addendum)
Occupational Therapy Evaluation Patient Details Name: Albert [name] MRN: [id number] DOB: Jun 12, 1955 Today's Date: 04/09/2018    History of Present Illness Pt is a 63 y.o. M with significant PMH of ESRD, PVD, who presents s/p right transmetarsal amputation and recent left  BKA. Of note, pt also has heel ulcer that is superficial over the Achilles.   Clinical Impression   Pt re-eval s/p sx. Pt PTA: lives alone was independent prior. Pt currently limited by decreased processing skills requiring multiple verbal cues and increased time to accomplish task. Pt performing bed mobility with minguardA lifting each hip to scoot forward. Pt performing lateral transfer/scoot from bed to drop arm chair with maxA +2 required. Pt with strong BUEs, but pt fatigues easily. Pt reports pain after pain meds provided. Pt would greatly benefit from continued OT skilled services for ADL, mobility and safety in SNF setting. OT to follow acutely to include sliding board transfers and assist with ADLs.      Follow Up Recommendations  SNF;Supervision/Assistance - 24 hour(for longer term as pt does not have w/c accessible apartment)    Equipment Recommendations  None recommended by OT    Recommendations for Other Services       Precautions / Restrictions Precautions Precautions: Fall Other Brace: R darco shoe Restrictions Weight Bearing Restrictions: Yes RLE Weight Bearing: Partial weight bearing RLE Partial Weight Bearing Percentage or Pounds: heel weightbearing with Darco shoe LLE Weight Bearing: Weight bearing as tolerated      Mobility Bed Mobility Overal bed mobility: Needs Assistance Bed Mobility: Rolling;Sidelying to Sit;Supine to Sit Rolling: Min guard Sidelying to sit: HOB elevated;Min assist(verbal cues to scoot hips toward EOB) Supine to sit: Min guard     General bed mobility comments: verbal cues to scoot hips to EOB  Transfers Overall transfer level: Needs assistance Equipment  used: None Transfers: Lateral/Scoot Transfers Sit to Stand: Max assist;+2 physical assistance         General transfer comment: Pt only able to perform lateral scoots at this time.    Balance Overall balance assessment: Needs assistance Sitting-balance support: Bilateral upper extremity supported Sitting balance-Leahy Scale: Good       Standing balance-Leahy Scale: Fair                             ADL either performed or assessed with clinical judgement   ADL Overall ADL's : Needs assistance/impaired Eating/Feeding: Modified independent   Grooming: Set up;Sitting   Upper Body Bathing: Set up;Sitting   Lower Body Bathing: Moderate assistance;Sitting/lateral leans   Upper Body Dressing : Set up;Sitting   Lower Body Dressing: Maximal assistance;Sitting/lateral leans;Bed level   Toilet Transfer: Transfer board;Requires drop arm;BSC   Toileting- Clothing Manipulation and Hygiene: Total assistance       Functional mobility during ADLs: Maximal assistance;+2 for physical assistance;+2 for safety/equipment(for lateral scooting to drop arm chair;) General ADL Comments: Unable to squat pivot to recliner due to LLE AKA and RLE requiring darco shoe as NWB. Pt seated for all ADL and dependent on caregivers for toilet hygiene as pt requires transfer board to drop arm commode.     Vision         Perception     Praxis      Pertinent Vitals/Pain Pain Assessment: 0-10 Pain Score: 7      Hand Dominance Right   Extremity/Trunk Assessment Upper Extremity Assessment Upper Extremity Assessment: Generalized weakness RUE Deficits / Details: 3+/5 grossly  LUE Deficits / Details: 3+/5 grossly   Lower Extremity Assessment Lower Extremity Assessment: Generalized weakness;RLE deficits/detail;LLE deficits/detail RLE Deficits / Details: s/p sx LLE Deficits / Details: s/p L BKA   Cervical / Trunk Assessment Cervical / Trunk Assessment: Normal   Communication  Communication Communication: No difficulties   Cognition Arousal/Alertness: Awake/alert Behavior During Therapy: WFL for tasks assessed/performed Overall Cognitive Status: No family/caregiver present to determine baseline cognitive functioning                                 General Comments: pt requires increased time to follow directions   General Comments  Pt requires multiple verbal cues to attend to task or answer question. Pt requireis increased processing time and might be HOH.    Exercises     Shoulder Instructions      Home Living Family/patient expects to be discharged to:: Private residence Living Arrangements: Alone Available Help at Discharge: Family;Available PRN/intermittently Type of Home: Apartment Home Access: Ramped entrance     Home Layout: One level     Bathroom Shower/Tub: Teacher, early years/pre: Standard Bathroom Accessibility: No(all handicapped access rooms are taken)   Home Equipment: Walker - 2 wheels;Shower seat;Wheelchair - manual          Prior Functioning/Environment Level of Independence: Independent with assistive device(s)        Comments: "furniture walks," in apartment, uses wheelchair for community mobility, independent with ADLs/selfcare        OT Problem List: Decreased strength;Decreased activity tolerance;Impaired balance (sitting and/or standing);Decreased coordination;Decreased safety awareness;Pain      OT Treatment/Interventions: Self-care/ADL training;Therapeutic exercise;Neuromuscular education;Energy conservation;DME and/or AE instruction;Therapeutic activities;Patient/family education;Balance training    OT Goals(Current goals can be found in the care plan section) Acute Rehab OT Goals Patient Stated Goal: decrease pain OT Goal Formulation: With patient Time For Goal Achievement: 04/14/18 Potential to Achieve Goals: Good  OT Frequency: Min 2X/week   Barriers to D/C: Decreased  caregiver support          Co-evaluation PT/OT/SLP Co-Evaluation/Treatment: Yes Reason for Co-Treatment: Complexity of the patient's impairments (multi-system involvement);For patient/therapist safety;To address functional/ADL transfers   OT goals addressed during session: ADL's and self-care;Other (comment)(safe transfers)      AM-PAC OT "6 Clicks" Daily Activity     Outcome Measure Help from another person eating meals?: None Help from another person taking care of personal grooming?: None Help from another person toileting, which includes using toliet, bedpan, or urinal?: Total Help from another person bathing (including washing, rinsing, drying)?: A Lot Help from another person to put on and taking off regular upper body clothing?: None Help from another person to put on and taking off regular lower body clothing?: Total 6 Click Score: 16   End of Session Equipment Utilized During Treatment: Gait belt Nurse Communication: Mobility status  Activity Tolerance: Patient limited by pain Patient left: in chair;with call bell/phone within reach;with chair alarm set  OT Visit Diagnosis: Other abnormalities of gait and mobility (R26.89);Muscle weakness (generalized) (M62.81) Pain - Right/Left: Right Pain - part of body: Ankle and joints of foot                Time: 1100-1140 OT Time Calculation (min): 40 min Charges:  OT General Charges $OT Visit: 1 Visit OT Evaluation $OT Re-eval: 1 Re-eval OT Treatments $Neuromuscular Re-education: 8-22 mins  Ebony Hail Harold Hedge) Marsa Aris OTR/L Acute Rehabilitation Services Pager: [phone] Office: [phone]  ALLYSON  JELENEK 04/09/2018, 1:32 PM

## 2018-04-09 NOTE — Progress Notes (Signed)
ANTICOAGULATION CONSULT NOTE - Follow-Up  Pharmacy Consult for Heparin  Indication: atrial fibrillation  Patient Measurements: Height: 6\' 2"  (188 cm) Weight: 203 lb 7.8 oz (92.3 kg) IBW/kg (Calculated) : 82.2  Vital Signs: Temp: 98.1 F (36.7 C) (02/16 2002) Temp Source: Oral (02/16 2002) BP: 108/72 (02/16 2002) Pulse Rate: 95 (02/16 1702)  Labs: Recent Labs    04/07/18 0403  04/07/18 2316 04/08/18 1606 04/09/18 0406 04/09/18 0802 04/09/18 1819  HGB 10.3*  --  9.6*  --  9.2*  --   --   HCT 32.6*  --  30.3*  --  28.6*  --   --   PLT 243  --  232  --  211  --   --   LABPROT 18.7*  --   --   --   --   --   --   INR 1.59  --   --   --   --   --   --   HEPARINUNFRC  --    < > 0.10* <0.10*  --  0.22* 0.34  CREATININE 6.42*  --  7.98*  --  5.00*  --   --    < > = values in this interval not displayed.    Estimated Creatinine Clearance: 17.4 mL/min (A) (by C-G formula based on SCr of 5 mg/dL (H)).  Assessment: 63 year old male on warfarin prior to admission for Afib currently on hold post BKA 2/15.  Pt is being bridged with heparin.   Warfarin on hold for now as possible BKA next week.   Heparin level is now therapeutic at 0.34 after increase to 1650 units/hr.  CBC stable. No bleeding noted.   Goal of Therapy:  Heparin level 0.3-0.7 units/ml Monitor platelets by anticoagulation protocol: Yes   Plan:  Continue heparin gtt at 1650 units/hr Daily heparin level and CBC while on therapy.   Sloan Leiter, PharmD, BCPS, BCCCP Clinical Pharmacist  **Pharmacist phone directory can now be found on [email or web address] ([email or web address]).  Listed under [place].  04/09/2018 8:04 PM

## 2018-04-09 NOTE — Progress Notes (Signed)
[facility]    NEPHROLOGY PROGRESS NOTE  SUBJECTIVE:  BKA yesterday.  HD last PM. No issues this AM  OBJECTIVE:  Vitals:   04/08/18 2333 04/09/18 0437  BP: (!) 106/52 115/73  Pulse: 93 95  Resp: (!) 23 (!) 25  Temp: 97.9 F (36.6 C) 98.5 F (36.9 C)  SpO2: 94% 93%    Intake/Output Summary (Last 24 hours) at 04/09/2018 0839 Last data filed at 04/09/2018 0457 Gross per 24 hour  Intake 1519.55 ml  Output 700 ml  Net 819.55 ml      Genearl:  AAOx3 NAD HEENT: MMM [place] AT anicteric sclera CV:  Reg, HR 90s; A flutter on monitor Lungs:  L/S CTA bilaterally Abd:  abd SNT/ND with normal BS Extremities:  No LE edema. S/p L BKA.  Left upper extremity AV fistula with a good thrill and bruit. Skin:  No skin rash Neuro; Conversant  MEDICATIONS:  . allopurinol  100 mg Oral Daily  . aspirin EC  81 mg Oral Daily  . Chlorhexidine Gluconate Cloth  6 each Topical Q0600  . Chlorhexidine Gluconate Cloth  6 each Topical Q0600  . cinacalcet  30 mg Oral Q supper  . darbepoetin (ARANESP) injection - DIALYSIS  100 mcg Intravenous Q Sat-HD  . diltiazem  180 mg Oral Daily  . doxercalciferol  3.5 mcg Intravenous Q T,Th,Sa-HD  . insulin aspart  0-9 Units Subcutaneous TID WC  . metoprolol tartrate  50 mg Oral BID  . midodrine  10 mg Oral TID WC  . pantoprazole  40 mg Oral Daily  . pravastatin  5 mg Oral QPM  . senna-docusate  1 tablet Oral BID  . sevelamer carbonate  1,600 mg Oral With snacks  . sevelamer carbonate  3,200 mg Oral TID WC  . sodium chloride flush  3 mL Intravenous Q12H    LABS:   CBC Latest Ref Rng & Units 04/09/2018 04/07/2018 04/07/2018  WBC 4.0 - 10.5 K/uL 15.0(H) 10.6(H) 10.1  Hemoglobin 13.0 - 17.0 g/dL 9.2(L) 9.6(L) 10.3(L)  Hematocrit 39.0 - 52.0 % 28.6(L) 30.3(L) 32.6(L)  Platelets 150 - 400 K/uL 211 232 243    CMP Latest Ref Rng & Units 04/09/2018 04/07/2018 04/07/2018  Glucose 70 - 99 mg/dL 208(H) 185(H) 152(H)  BUN 8 - 23 mg/dL 23 43(H) 30(H)   Creatinine 0.61 - 1.24 mg/dL 5.00(H) 7.98(H) 6.42(H)  Sodium 135 - 145 mmol/L 136 133(L) 137  Potassium 3.5 - 5.1 mmol/L 4.2 4.3 4.4  Chloride 98 - 111 mmol/L 96(L) 93(L) 96(L)  CO2 22 - 32 mmol/L 24 24 28   Calcium 8.9 - 10.3 mg/dL 8.3(L) 8.5(L) 8.6(L)  Total Protein 6.5 - 8.1 g/dL - - -  Total Bilirubin 0.3 - 1.2 mg/dL - - -  Alkaline Phos 38 - 126 U/L - - -  AST 15 - 41 U/L - - -  ALT 17 - 63 U/L - - -    Lab Results  Component Value Date   PTH 350.0 (H) 03/21/2007   CALCIUM 8.3 (L) 04/09/2018   CAION 0.95 (L) 01/18/2017   PHOS 4.5 04/07/2018       Component Value Date/Time   COLORURINE YELLOW 10/12/2013 1045   APPEARANCEUR CLEAR 10/12/2013 1045   LABSPEC 1.015 10/12/2013 1045   PHURINE 8.0 10/12/2013 1045   GLUCOSEU NEGATIVE 10/12/2013 1045   HGBUR MODERATE (A) 10/12/2013 1045   BILIRUBINUR NEGATIVE 10/12/2013 1045   KETONESUR NEGATIVE 10/12/2013 1045   PROTEINUR 30 (A) 10/12/2013 1045  UROBILINOGEN 1.0 10/12/2013 1045   NITRITE NEGATIVE 10/12/2013 1045   LEUKOCYTESUR NEGATIVE 10/12/2013 1045      Component Value Date/Time   PHART 7.405 10/12/2013 0352   PCO2ART 32.2 (L) 10/12/2013 0352   PO2ART 111.0 (H) 10/12/2013 0352   HCO3 20.4 10/12/2013 0352   TCO2 28 01/18/2017 1346   ACIDBASEDEF 3.9 (H) 10/12/2013 0352   O2SAT 99.1 10/12/2013 0352       Component Value Date/Time   IRON 46 03/16/2007 0610   TIBC 358 03/16/2007 0610   FERRITIN 237 03/16/2007 0610   IRONPCTSAT 13 (L) 03/16/2007 0610    Hemodialysis prescription: TTS, 4 hours 15 minutes, [facility].  Dry weight 97 kg.  Left brachiocephalic fistula.   ASSESSMENT/PLAN:     1. Bilateral arterial insufficiency- with cellulitis and gangrenous changes.  On abx, S/p angio, s/p TMTs on both sides  2/5, s/p LBKA 2/15. 2. ESRD continue with HD qTTS Dr.  Lowanda Foster, on HD.  Below EDW losing weight.  Poor po intake.  HD with UF 0.5L 2/15.  Will need new EDW on D/C. Next HD Tues.  3. Anemia: cont with ESA.   Hemoglobin 9.2, cont ESA. 4. CKD-MBD: continue with binders and vit D 5. Nutrition: renal diet 6. Hypertension: not a current issue, see below for a fib. 7. DM- stable 8. Afib: on cardizem and metoprolol, Coumadin per pharmacy. Cardiology involved now given tachycardia - midodrine added by cardiology to allow titration of blocking agents.   Jannifer Hick MD

## 2018-04-09 NOTE — Progress Notes (Signed)
Physical Therapy Re-Evaluation Patient Details Name: Albert [name] MRN: [id number] DOB: Jan 13, 1956 Today's Date: 04/09/2018   History of Present Illness  Pt is a 63 y.o. M with significant PMH of ESRD, PVD, who presents s/p right transmetarsal amputation and recent left  BKA. Of note, pt also has heel ulcer that is superficial over the Achilles. S/p L transtibial amputation 2/15  Clinical Impression  Pt re-evaluated s/p L BKA. Pt presenting with some confusion, with decreased short term memory (pt not recalling he took his pain medication 15 minutes prior). Currently requiring two person maximal assistance for lateral scoot transfer from bed to chair towards right. Education provided on desensitization techniques and residual limb positioning to prevent contractures. Given that pt lives alone in a non wheelchair accessible apartment, pt will likely need longer term rehab placement. Recommending SNF at discharge for strengthening, transfer training and wheelchair mobility training.   Updated goals.     Follow Up Recommendations SNF    Equipment Recommendations  Other (comment)(defer)    Recommendations for Other Services       Precautions / Restrictions Precautions Precautions: Fall Precaution Comments: L BKA Other Brace: R darco shoe Restrictions Weight Bearing Restrictions: Yes RLE Weight Bearing: Partial weight bearing RLE Partial Weight Bearing Percentage or Pounds: heel weightbearing with Darco shoe LLE Weight Bearing: Non weight bearing      Mobility  Bed Mobility Overal bed mobility: Needs Assistance Bed Mobility: Rolling;Sidelying to Sit;Supine to Sit Rolling: Min guard Sidelying to sit: HOB elevated;Min assist(verbal cues to scoot hips toward EOB) Supine to sit: Min guard     General bed mobility comments: verbal cues to scoot hips to EOB  Transfers Overall transfer level: Needs assistance Equipment used: None Transfers: Lateral/Scoot Transfers Sit to  Stand: Max assist;+2 physical assistance         General transfer comment: Pt only able to perform lateral scoots at this time. requiring multiple scoots to progress from bed to chair towards right  Ambulation/Gait                Stairs            Wheelchair Mobility    Modified Rankin (Stroke Patients Only)       Balance Overall balance assessment: Needs assistance Sitting-balance support: Bilateral upper extremity supported Sitting balance-Leahy Scale: Good       Standing balance-Leahy Scale: Fair                               Pertinent Vitals/Pain Pain Assessment: 0-10 Pain Score: 7  Pain Location: right foot Pain Descriptors / Indicators: Grimacing;Guarding Pain Intervention(s): Monitored during session;Patient requesting pain meds-RN notified    Home Living Family/patient expects to be discharged to:: Private residence Living Arrangements: Alone Available Help at Discharge: Family;Available PRN/intermittently Type of Home: Apartment Home Access: Ramped entrance     Home Layout: One level Home Equipment: Walker - 2 wheels;Shower seat;Wheelchair - manual      Prior Function Level of Independence: Independent with assistive device(s)         Comments: "furniture walks," in apartment, uses wheelchair for community mobility, independent with ADLs/selfcare     Hand Dominance   Dominant Hand: Right    Extremity/Trunk Assessment   Upper Extremity Assessment Upper Extremity Assessment: Defer to OT evaluation RUE Deficits / Details: 3+/5 grossly LUE Deficits / Details: 3+/5 grossly    Lower Extremity Assessment Lower Extremity Assessment: RLE deficits/detail  RLE Deficits / Details: transmetatarsal amputation LLE Deficits / Details: s/p L BKA with gross weakness    Cervical / Trunk Assessment Cervical / Trunk Assessment: Normal  Communication   Communication: No difficulties  Cognition Arousal/Alertness:  Awake/alert Behavior During Therapy: WFL for tasks assessed/performed Overall Cognitive Status: No family/caregiver present to determine baseline cognitive functioning                                 General Comments: pt requires increased time to follow directions      General Comments General comments (skin integrity, edema, etc.): Pt requires multiple verbal cues to attend to task or answer question. Pt requireis increased processing time and might be HOH.    Exercises     Assessment/Plan    PT Assessment Patient needs continued PT services  PT Problem List Decreased strength;Decreased range of motion;Decreased activity tolerance;Decreased balance;Decreased mobility;Pain       PT Treatment Interventions DME instruction;Functional mobility training;Therapeutic activities;Therapeutic exercise;Balance training;Patient/family education;Wheelchair mobility training    PT Goals (Current goals can be found in the Care Plan section)  Acute Rehab PT Goals Patient Stated Goal: decrease pain PT Goal Formulation: With patient Time For Goal Achievement: 04/23/18 Potential to Achieve Goals: Good    Frequency Min 2X/week   Barriers to discharge        Co-evaluation PT/OT/SLP Co-Evaluation/Treatment: Yes Reason for Co-Treatment: For patient/therapist safety;To address functional/ADL transfers PT goals addressed during session: Mobility/safety with mobility OT goals addressed during session: ADL's and self-care;Other (comment)(safe transfers)       AM-PAC PT "6 Clicks" Mobility  Outcome Measure Help needed turning from your back to your side while in a flat bed without using bedrails?: None Help needed moving from lying on your back to sitting on the side of a flat bed without using bedrails?: A Little Help needed moving to and from a bed to a chair (including a wheelchair)?: A Lot Help needed standing up from a chair using your arms (e.g., wheelchair or bedside  chair)?: Total Help needed to walk in hospital room?: Total Help needed climbing 3-5 steps with a railing? : Total 6 Click Score: 12    End of Session Equipment Utilized During Treatment: Gait belt Activity Tolerance: Patient tolerated treatment well Patient left: in chair;with call bell/phone within reach Nurse Communication: Mobility status PT Visit Diagnosis: Other abnormalities of gait and mobility (R26.89);Muscle weakness (generalized) (M62.81);Pain Pain - Right/Left: Right Pain - part of body: Ankle and joints of foot    Time: 1100-1135 PT Time Calculation (min) (ACUTE ONLY): 35 min   Charges:   PT Evaluation $PT Re-evaluation: 1 Re-eval          Ellamae Sia, PT, DPT Acute Rehabilitation Services Pager [phone] Office [phone]   Willy Eddy 04/09/2018, 2:29 PM

## 2018-04-09 NOTE — Progress Notes (Addendum)
Progress Note  Patient Name: Albert [name] Date of Encounter: 04/09/2018  Primary Cardiologist: Albert Lesches, MD   Subjective   Feeling well.  No chest pain or palpitations.  Complains of hiccups since surgery.  Inpatient Medications    Scheduled Meds: . allopurinol  100 mg Oral Daily  . aspirin EC  81 mg Oral Daily  . Chlorhexidine Gluconate Cloth  6 each Topical Q0600  . Chlorhexidine Gluconate Cloth  6 each Topical Q0600  . cinacalcet  30 mg Oral Q supper  . darbepoetin (ARANESP) injection - DIALYSIS  100 mcg Intravenous Q Sat-HD  . diltiazem  180 mg Oral Daily  . doxercalciferol  3.5 mcg Intravenous Q T,Th,Sa-HD  . insulin aspart  0-9 Units Subcutaneous TID WC  . metoprolol tartrate  50 mg Oral BID  . midodrine  10 mg Oral TID WC  . pantoprazole  40 mg Oral Daily  . pravastatin  5 mg Oral QPM  . senna-docusate  1 tablet Oral BID  . sevelamer carbonate  1,600 mg Oral With snacks  . sevelamer carbonate  3,200 mg Oral TID WC  . sodium chloride flush  3 mL Intravenous Q12H   Continuous Infusions: . sodium chloride    . sodium chloride    . sodium chloride    . sodium chloride 10 mL/hr at 04/09/18 0909  . amiodarone 30 mg/hr (04/09/18 0457)  .  ceFAZolin (ANCEF) IV 1 g (04/09/18 0910)  . heparin 1,650 Units/hr (04/09/18 0927)   PRN Meds: sodium chloride, sodium chloride, sodium chloride, acetaminophen, alteplase, heparin, heparin, hydrALAZINE, labetalol, lidocaine (PF), lidocaine-prilocaine, morphine injection, ondansetron (ZOFRAN) IV, oxyCODONE, pentafluoroprop-tetrafluoroeth, phenol, sodium chloride flush   Vital Signs    Vitals:   04/09/18 0437 04/09/18 0856 04/09/18 0859 04/09/18 0900  BP: 115/73 123/67 123/67 123/67  Pulse: 95  90 95  Resp: (!) 25   19  Temp: 98.5 F (36.9 C)   97.6 F (36.4 C)  TempSrc: Oral   Oral  SpO2: 93%   95%  Weight:      Height:        Intake/Output Summary (Last 24 hours) at 04/09/2018 0935 Last data filed at  04/09/2018 0457 Gross per 24 hour  Intake 1519.55 ml  Output 700 ml  Net 819.55 ml   Last 3 Weights 04/08/2018 04/08/2018 04/08/2018  Weight (lbs) 203 lb 7.8 oz 204 lb 9.4 oz 206 lb 2.1 oz  Weight (kg) 92.3 kg 92.8 kg 93.5 kg      Telemetry    Atrial fibrillation.  Rate <100 bpm.  PVCs - Personally Reviewed  ECG    n/a - Personally Reviewed  Physical Exam   VS:  BP 123/67 (BP Location: Right Arm)   Pulse 95   Temp 97.6 F (36.4 C) (Oral)   Resp 19   Ht 6\' 2"  (1.88 m)   Wt 92.3 kg   SpO2 95%   BMI 26.13 kg/m  , BMI Body mass index is 26.13 kg/m. GENERAL:  Well appearing HEENT: Pupils equal round and reactive, fundi not visualized, oral mucosa unremarkable NECK:  No jugular venous distention, waveform within normal limits, carotid upstroke brisk and symmetric, no bruits, no thyromegaly LYMPHATICS:  No cervical adenopathy LUNGS:  Clear to auscultation bilaterally HEART:  Irregularly irregular. Marland Kitchen  PMI not displaced or sustained,S1 and S2 within normal limits, no S3, no S4, no clicks, no rubs, no murmurs ABD:  Flat, positive bowel sounds normal in frequency in pitch, no bruits, no  rebound, no guarding, no midline pulsatile mass, no hepatomegaly, no splenomegaly EXT:  2 plus pulses throughout, no edema, no cyanosis no clubbing.  R TMA.  L AKA. SKIN:  No rashes no nodules NEURO:  Cranial nerves II through XII grossly intact, motor grossly intact throughout [facility]:  Cognitively intact, oriented to person place and time    Labs    Chemistry Recent Labs  Lab 04/04/18 1429 04/05/18 0315  04/07/18 0403 04/07/18 2316 04/09/18 0406  NA 133* 135   < > 137 133* 136  K 4.1 3.7   < > 4.4 4.3 4.2  CL 95* 95*   < > 96* 93* 96*  CO2 22 27   < > 28 24 24   GLUCOSE 129* 146*   < > 152* 185* 208*  BUN 74* 31*   < > 30* 43* 23  CREATININE 10.34* 6.13*   < > 6.42* 7.98* 5.00*  CALCIUM 8.7* 8.5*   < > 8.6* 8.5* 8.3*  ALBUMIN 1.7* 1.8*  --   --  1.7*  --   GFRNONAA 5* 9*   < > 8* 6* 11*   GFRAA 5* 10*   < > 10* 7* 13*  ANIONGAP 16* 13   < > 13 16* 16*   < > = values in this interval not displayed.     Hematology Recent Labs  Lab 04/07/18 0403 04/07/18 2316 04/09/18 0406  WBC 10.1 10.6* 15.0*  RBC 3.66* 3.41* 3.22*  HGB 10.3* 9.6* 9.2*  HCT 32.6* 30.3* 28.6*  MCV 89.1 88.9 88.8  MCH 28.1 28.2 28.6  MCHC 31.6 31.7 32.2  RDW 15.1 15.0 15.0  PLT 243 232 211    Cardiac EnzymesNo results for input(s): TROPONINI in the last 168 hours. No results for input(s): TROPIPOC in the last 168 hours.   BNPNo results for input(s): BNP, PROBNP in the last 168 hours.   DDimer No results for input(s): DDIMER in the last 168 hours.   Radiology    No results found.  Cardiac Studies   Echocardiogram 11/2016: Study Conclusions: - Left ventricle: The cavity size was normal. Wall thickness was increased in a pattern of mild LVH. Systolic function was normal. The estimated ejection fraction was in the range of 50% to 55%. Wall motion was normal; there were no regional wall motion abnormalities. The study was not technically sufficient to allow evaluation of LV diastolic dysfunction due to atrial fibrillation. Indeterminate filling pressures. - Aorta: Mild aortic root dilatation. Diameter 4 cm. - Mitral valve: Calcified annulus. Normal thickness leaflets . - Left atrium: The atrium was moderately dilated. - Right ventricle: The cavity size was mildly dilated. Systolic function was mildly reduced. - Right atrium: The atrium was mildly dilated. - Tricuspid valve: There was mild regurgitation.  Patient Profile      [name] is a 69M with persistent atrial fibrillation, hypertension, hyperlipidemia, diabetes, ESRD on HD and PAD with chronic limb ischemia here for L AKA vs. BKA.   Assessment & Plan    # Persistent atrial fibrillation: Rate control has been limited by hypotension.  BP improved after starting midodrine this admission.  Carvedilol was  switched to metoprolol and he has continued on diltiazem.  Rate control is better today after adding amiodarone.  We will switch amiodarone to oral today.  Coumadin was reversed and he was started on heparin for surgery.  Recommend restarting warfarin when OK per surgery.  He isn't very symptomatic in afib, but rates are challenging to  control.  Would consider outpatient DCCV after he has been therapeutic on warfarin for 3 weeks in order to try and get him off amiodarone.   # Hypotension: Improved on midodrine.  ESRD on HD: per nephrology  # PAD: History of chronic limb ischemia.  Underwent L AKA 2/15.      For questions or updates, please contact [place] Please consult [email or web address] for contact info under        Signed, Skeet Latch, MD  04/09/2018, 9:35 AM

## 2018-04-09 NOTE — Progress Notes (Addendum)
  Progress Note    04/09/2018 11:04 AM 1 Day Post-Op  Subjective:  Says his pain is better since surgery  Afebrile  Vitals:   04/09/18 0859 04/09/18 0900  BP: 123/67 123/67  Pulse: 90 95  Resp:  19  Temp:  97.6 F (36.4 C)  SpO2:  95%    Physical Exam: Incisions:  Bandage is clean and dry.  Pt almost able to straighten his knee   CBC    Component Value Date/Time   WBC 15.0 (H) 04/09/2018 0406   RBC 3.22 (L) 04/09/2018 0406   HGB 9.2 (L) 04/09/2018 0406   HCT 28.6 (L) 04/09/2018 0406   PLT 211 04/09/2018 0406   MCV 88.8 04/09/2018 0406   MCH 28.6 04/09/2018 0406   MCHC 32.2 04/09/2018 0406   RDW 15.0 04/09/2018 0406   LYMPHSABS 0.7 03/20/2018 1931   MONOABS 0.7 03/20/2018 1931   EOSABS 0.1 03/20/2018 1931   BASOSABS 0.0 03/20/2018 1931    BMET    Component Value Date/Time   NA 136 04/09/2018 0406   K 4.2 04/09/2018 0406   CL 96 (L) 04/09/2018 0406   CO2 24 04/09/2018 0406   GLUCOSE 208 (H) 04/09/2018 0406   BUN 23 04/09/2018 0406   CREATININE 5.00 (H) 04/09/2018 0406   CALCIUM 8.3 (L) 04/09/2018 0406   CALCIUM 9.0 03/21/2007 0415   GFRNONAA 11 (L) 04/09/2018 0406   GFRAA 13 (L) 04/09/2018 0406    INR    Component Value Date/Time   INR 1.59 04/07/2018 0403     Intake/Output Summary (Last 24 hours) at 04/09/2018 1104 Last data filed at 04/09/2018 0730 Gross per 24 hour  Intake 1759.55 ml  Output 700 ml  Net 1059.55 ml     Assessment/Plan:  63 y.o. male is s/p left above knee amputation  1 Day Post-Op  -pt doing well today.  His pain is less than before surgery. -bandage is clean and dry.  Will take this off tomorrow -talked with pt about working on straightening knee throughout the day to avoid contracture of the knee.  He was almost able to straighten all the way. -CIR vs SNF; consult for CIR placed yesterday  -okay to restart coumadin today per vascular surgery   Leontine Locket, PA-C [facility] [phone] 04/09/2018 11:04 AM  Left BKA dressing dry Right foot has small and skin edges are necrotic from prior TMA will need further debridement or R BKA later this week.  Will discuss with DR Butch Penny, MD [facility] [place] Office: [phone] Pager: [phone]

## 2018-04-10 DIAGNOSIS — Z992 Dependence on renal dialysis: Secondary | ICD-10-CM | POA: Diagnosis not present

## 2018-04-10 DIAGNOSIS — N186 End stage renal disease: Secondary | ICD-10-CM | POA: Diagnosis not present

## 2018-04-10 LAB — CBC
HCT: 31.5 % — ABNORMAL LOW (ref 39.0–52.0)
Hemoglobin: 9.5 g/dL — ABNORMAL LOW (ref 13.0–17.0)
MCH: 26.9 pg (ref 26.0–34.0)
MCHC: 30.2 g/dL (ref 30.0–36.0)
MCV: 89.2 fL (ref 80.0–100.0)
NRBC: 0 % (ref 0.0–0.2)
Platelets: 224 10*3/uL (ref 150–400)
RBC: 3.53 MIL/uL — ABNORMAL LOW (ref 4.22–5.81)
RDW: 15.3 % (ref 11.5–15.5)
WBC: 15.8 10*3/uL — ABNORMAL HIGH (ref 4.0–10.5)

## 2018-04-10 LAB — BASIC METABOLIC PANEL
Anion gap: 18 — ABNORMAL HIGH (ref 5–15)
BUN: 38 mg/dL — ABNORMAL HIGH (ref 8–23)
CO2: 24 mmol/L (ref 22–32)
Calcium: 8.4 mg/dL — ABNORMAL LOW (ref 8.9–10.3)
Chloride: 94 mmol/L — ABNORMAL LOW (ref 98–111)
Creatinine, Ser: 6.72 mg/dL — ABNORMAL HIGH (ref 0.61–1.24)
GFR calc non Af Amer: 8 mL/min — ABNORMAL LOW (ref >60)
GFR, EST AFRICAN AMERICAN: 9 mL/min — AB (ref >60)
Glucose, Bld: 159 mg/dL — ABNORMAL HIGH (ref 70–99)
Potassium: 4.6 mmol/L (ref 3.5–5.1)
Sodium: 136 mmol/L (ref 135–145)

## 2018-04-10 LAB — GLUCOSE, CAPILLARY
GLUCOSE-CAPILLARY: 149 mg/dL — AB (ref 70–99)
Glucose-Capillary: 148 mg/dL — ABNORMAL HIGH (ref 70–99)
Glucose-Capillary: 162 mg/dL — ABNORMAL HIGH (ref 70–99)
Glucose-Capillary: 141 mg/dL — ABNORMAL HIGH (ref 70–99)

## 2018-04-10 LAB — HEPARIN LEVEL (UNFRACTIONATED)
Heparin Unfractionated: 1.1 IU/mL — ABNORMAL HIGH (ref 0.30–0.70)
Heparin Unfractionated: >2.2 IU/mL — ABNORMAL HIGH (ref 0.30–0.70)

## 2018-04-10 MED ORDER — HEPARIN (PORCINE) 25000 UT/250ML-% IV SOLN
1400.0000 [IU]/h | INTRAVENOUS | Status: DC
  Administered 2018-04-10: 1400 [IU]/h via INTRAVENOUS
  Filled 2018-04-10: qty 250

## 2018-04-10 NOTE — Progress Notes (Signed)
Progress Note  Patient Name: Albert [name] Date of Encounter: 04/10/2018  Primary Cardiologist: Albert Lesches, MD   Subjective   No complaints this am.   Inpatient Medications    Scheduled Meds: . allopurinol  100 mg Oral Daily  . [START ON 04/14/2018] amiodarone  200 mg Oral Daily  . amiodarone  400 mg Oral BID  . aspirin EC  81 mg Oral Daily  . Chlorhexidine Gluconate Cloth  6 each Topical Q0600  . Chlorhexidine Gluconate Cloth  6 each Topical Q0600  . cinacalcet  30 mg Oral Q supper  . darbepoetin (ARANESP) injection - DIALYSIS  100 mcg Intravenous Q Sat-HD  . diltiazem  180 mg Oral Daily  . doxercalciferol  3.5 mcg Intravenous Q T,Th,Sa-HD  . insulin aspart  0-9 Units Subcutaneous TID WC  . metoprolol tartrate  50 mg Oral BID  . midodrine  10 mg Oral TID WC  . pantoprazole  40 mg Oral Daily  . pravastatin  5 mg Oral QPM  . senna-docusate  1 tablet Oral BID  . sevelamer carbonate  1,600 mg Oral With snacks  . sevelamer carbonate  3,200 mg Oral TID WC  . sodium chloride flush  3 mL Intravenous Q12H   Continuous Infusions: . sodium chloride    . sodium chloride    . sodium chloride    . sodium chloride Stopped (04/09/18 1426)  . heparin 1,650 Units/hr (04/10/18 0629)   PRN Meds: sodium chloride, sodium chloride, sodium chloride, acetaminophen, alteplase, heparin, heparin, hydrALAZINE, labetalol, lidocaine (PF), lidocaine-prilocaine, morphine injection, ondansetron (ZOFRAN) IV, oxyCODONE, pentafluoroprop-tetrafluoroeth, phenol, sodium chloride flush   Vital Signs    Vitals:   04/09/18 2002 04/09/18 2127 04/10/18 0630 04/10/18 0753  BP: 108/72 128/77 119/75 115/76  Pulse:  86    Resp: 18  18 18   Temp: 98.1 F (36.7 C)  98 F (36.7 C) 98.1 F (36.7 C)  TempSrc: Oral  Oral Oral  SpO2: 94%  95% 95%  Weight:      Height:        Intake/Output Summary (Last 24 hours) at 04/10/2018 1020 Last data filed at 04/10/2018 0800 Gross per 24 hour  Intake  1220.44 ml  Output -  Net 1220.44 ml   Last 3 Weights 04/08/2018 04/08/2018 04/08/2018  Weight (lbs) 203 lb 7.8 oz 204 lb 9.4 oz 206 lb 2.1 oz  Weight (kg) 92.3 kg 92.8 kg 93.5 kg      Telemetry   Atrial fib - Personally Reviewed  ECG    n/a - Personally Reviewed  Physical Exam   VS:  BP 115/76 (BP Location: Right Arm)   Pulse 86   Temp 98.1 F (36.7 C) (Oral)   Resp 18   Ht 6\' 2"  (1.88 m)   Wt 92.3 kg   SpO2 95%   BMI 26.13 kg/m  , BMI Body mass index is 26.13 kg/m.  General: Well developed, well nourished, NAD  HEENT: OP clear, mucus membranes moist  SKIN: warm, dry. No rashes. Neuro: No focal deficits  Musculoskeletal: Muscle strength 5/5 all ext  Psychiatric: Mood and affect normal  Neck: No JVD, no carotid bruits, no thyromegaly, no lymphadenopathy.  Lungs:Clear bilaterally, no wheezes, rhonci, crackles Cardiovascular: Irreg irreg. No murmurs, gallops or rubs. Abdomen:Soft. Bowel sounds present. Non-tender.  Extremities: No lower extremity edema.  left AKA    Labs    Chemistry Recent Labs  Lab 04/04/18 1429 04/05/18 0315  04/07/18 2316 04/09/18 0406 04/10/18 4128  NA 133* 135   < > 133* 136 136  K 4.1 3.7   < > 4.3 4.2 4.6  CL 95* 95*   < > 93* 96* 94*  CO2 22 27   < > 24 24 24   GLUCOSE 129* 146*   < > 185* 208* 159*  BUN 74* 31*   < > 43* 23 38*  CREATININE 10.34* 6.13*   < > 7.98* 5.00* 6.72*  CALCIUM 8.7* 8.5*   < > 8.5* 8.3* 8.4*  ALBUMIN 1.7* 1.8*  --  1.7*  --   --   GFRNONAA 5* 9*   < > 6* 11* 8*  GFRAA 5* 10*   < > 7* 13* 9*  ANIONGAP 16* 13   < > 16* 16* 18*   < > = values in this interval not displayed.     Hematology Recent Labs  Lab 04/07/18 2316 04/09/18 0406 04/10/18 0344  WBC 10.6* 15.0* 15.8*  RBC 3.41* 3.22* 3.53*  HGB 9.6* 9.2* 9.5*  HCT 30.3* 28.6* 31.5*  MCV 88.9 88.8 89.2  MCH 28.2 28.6 26.9  MCHC 31.7 32.2 30.2  RDW 15.0 15.0 15.3  PLT 232 211 224    Cardiac EnzymesNo results for input(s): TROPONINI in the  last 168 hours. No results for input(s): TROPIPOC in the last 168 hours.   BNPNo results for input(s): BNP, PROBNP in the last 168 hours.   DDimer No results for input(s): DDIMER in the last 168 hours.   Radiology    No results found.  Cardiac Studies   Echocardiogram 11/2016: Study Conclusions: - Left ventricle: The cavity size was normal. Wall thickness was increased in a pattern of mild LVH. Systolic function was normal. The estimated ejection fraction was in the range of 50% to 55%. Wall motion was normal; there were no regional wall motion abnormalities. The study was not technically sufficient to allow evaluation of LV diastolic dysfunction due to atrial fibrillation. Indeterminate filling pressures. - Aorta: Mild aortic root dilatation. Diameter 4 cm. - Mitral valve: Calcified annulus. Normal thickness leaflets . - Left atrium: The atrium was moderately dilated. - Right ventricle: The cavity size was mildly dilated. Systolic function was mildly reduced. - Right atrium: The atrium was mildly dilated. - Tricuspid valve: There was mild regurgitation.  Patient Profile      Albert [name] is a 63 yo male with history of persistent atrial fibrillation, hypertension, hyperlipidemia, diabetes, ESRD on HD and PAD with chronic limb ischemia here for L AKA   Assessment & Plan    1. Persistent atrial fibrillation: Atrial fib today. Rate controlled on metoprolol, diltiazem and amiodarone. He has been on heparin following his surgical procedure. Possible further surgical intervention this week. Would restart coumadin when ok with surgical team. We can consider an outpatient DCCV following 3 weeks of therapeutic INR once back on coumadin.     2. Hypotension: Continue midodrine.      For questions or updates, please contact [place] Please consult [email or web address] for contact info under        Signed, Albert Chandler, MD  04/10/2018, 10:20 AM

## 2018-04-10 NOTE — Progress Notes (Addendum)
  Progress Note    04/10/2018 8:01 AM 2 Days Post-Op  Subjective:  Having some pain this morning but still better than prior to surgery.  Says he feels better today.  Afebrile  Vitals:   04/10/18 0630 04/10/18 0753  BP: 119/75 115/76  Pulse:    Resp: 18   Temp: 98 F (36.7 C) 98.1 F (36.7 C)  SpO2: 95% 95%    Physical Exam: Incisions:  Clean and dry with staples in tact.    CBC    Component Value Date/Time   WBC 15.8 (H) 04/10/2018 0344   RBC 3.53 (L) 04/10/2018 0344   HGB 9.5 (L) 04/10/2018 0344   HCT 31.5 (L) 04/10/2018 0344   PLT 224 04/10/2018 0344   MCV 89.2 04/10/2018 0344   MCH 26.9 04/10/2018 0344   MCHC 30.2 04/10/2018 0344   RDW 15.3 04/10/2018 0344   LYMPHSABS 0.7 03/20/2018 1931   MONOABS 0.7 03/20/2018 1931   EOSABS 0.1 03/20/2018 1931   BASOSABS 0.0 03/20/2018 1931    BMET    Component Value Date/Time   NA 136 04/10/2018 0344   K 4.6 04/10/2018 0344   CL 94 (L) 04/10/2018 0344   CO2 24 04/10/2018 0344   GLUCOSE 159 (H) 04/10/2018 0344   BUN 38 (H) 04/10/2018 0344   CREATININE 6.72 (H) 04/10/2018 0344   CALCIUM 8.4 (L) 04/10/2018 0344   CALCIUM 9.0 03/21/2007 0415   GFRNONAA 8 (L) 04/10/2018 0344   GFRAA 9 (L) 04/10/2018 0344    INR    Component Value Date/Time   INR 1.59 04/07/2018 0403     Intake/Output Summary (Last 24 hours) at 04/10/2018 0801 Last data filed at 04/09/2018 1600 Gross per 24 hour  Intake 980.44 ml  Output -  Net 980.44 ml     Assessment/Plan:  63 y.o. male is s/p left above knee amputation  2 Days Post-Op  -pt's left BKA incision looks good.   -continue to work on straightening knee -decision about right leg to be made by Dr. Donzetta Matters. -heparin gtt for afib per primary   Leontine Locket, PA-C [facility] [phone] 04/10/2018 8:01 AM    I have independently interviewed and examined patient and agree with PA assessment and plan above.  Left below-knee amputation site healing well.   Unfortunate the right transmetatarsal amputation is not healing has foul-smelling drainage.  We will plan for debridement versus below-knee amputation tomorrow in the operating room.  He is n.p.o. past midnight.  I discussed with him that debridement may fail and he may later need amputation.  He will make decision tomorrow.  Aubrei Bouchie C. Donzetta Matters, MD [facility] [place] Office: [phone] Pager: [phone]

## 2018-04-10 NOTE — Progress Notes (Signed)
Inpatient Rehabilitation-Admissions Coordinator   Noted recommendations are now for SNF due to tolerance and need for longer term rehab placement. AC will sign off.   Please call if questions.   Jhonnie Garner, OTR/L  Rehab Admissions Coordinator  [phone] 04/10/2018 5:52 PM

## 2018-04-10 NOTE — Progress Notes (Signed)
[facility]    NEPHROLOGY PROGRESS NOTE  SUBJECTIVE:  No c/o  OBJECTIVE:  Vitals:   04/10/18 0630 04/10/18 0753  BP: 119/75 115/76  Pulse:    Resp: 18 18  Temp: 98 F (36.7 C) 98.1 F (36.7 C)  SpO2: 95% 95%    Intake/Output Summary (Last 24 hours) at 04/10/2018 1033 Last data filed at 04/10/2018 0800 Gross per 24 hour  Intake 1220.44 ml  Output -  Net 1220.44 ml      [place]:  AAOx3 NAD HEENT: MMM [place] AT anicteric sclera CV:  Reg, HR 90s; A flutter on monitor Lungs:  L/S CTA bilaterally Abd:  abd SNT/ND with normal BS Extremities:  No LE edema. S/p L BKA.  Left upper extremity AV fistula with a good thrill and bruit. Skin:  No skin rash Neuro; Conversant  MEDICATIONS:  . allopurinol  100 mg Oral Daily  . [START ON 04/14/2018] amiodarone  200 mg Oral Daily  . amiodarone  400 mg Oral BID  . aspirin EC  81 mg Oral Daily  . Chlorhexidine Gluconate Cloth  6 each Topical Q0600  . Chlorhexidine Gluconate Cloth  6 each Topical Q0600  . cinacalcet  30 mg Oral Q supper  . darbepoetin (ARANESP) injection - DIALYSIS  100 mcg Intravenous Q Sat-HD  . diltiazem  180 mg Oral Daily  . doxercalciferol  3.5 mcg Intravenous Q T,Th,Sa-HD  . insulin aspart  0-9 Units Subcutaneous TID WC  . metoprolol tartrate  50 mg Oral BID  . midodrine  10 mg Oral TID WC  . pantoprazole  40 mg Oral Daily  . pravastatin  5 mg Oral QPM  . senna-docusate  1 tablet Oral BID  . sevelamer carbonate  1,600 mg Oral With snacks  . sevelamer carbonate  3,200 mg Oral TID WC  . sodium chloride flush  3 mL Intravenous Q12H    LABS:   CBC Latest Ref Rng & Units 04/10/2018 04/09/2018 04/07/2018  WBC 4.0 - 10.5 K/uL 15.8(H) 15.0(H) 10.6(H)  Hemoglobin 13.0 - 17.0 g/dL 9.5(L) 9.2(L) 9.6(L)  Hematocrit 39.0 - 52.0 % 31.5(L) 28.6(L) 30.3(L)  Platelets 150 - 400 K/uL 224 211 232    CMP Latest Ref Rng & Units 04/10/2018 04/09/2018 04/07/2018  Glucose 70 - 99 mg/dL 159(H) 208(H) 185(H)  BUN 8 - 23  mg/dL 38(H) 23 43(H)  Creatinine 0.61 - 1.24 mg/dL 6.72(H) 5.00(H) 7.98(H)  Sodium 135 - 145 mmol/L 136 136 133(L)  Potassium 3.5 - 5.1 mmol/L 4.6 4.2 4.3  Chloride 98 - 111 mmol/L 94(L) 96(L) 93(L)  CO2 22 - 32 mmol/L 24 24 24   Calcium 8.9 - 10.3 mg/dL 8.4(L) 8.3(L) 8.5(L)  Total Protein 6.5 - 8.1 g/dL - - -  Total Bilirubin 0.3 - 1.2 mg/dL - - -  Alkaline Phos 38 - 126 U/L - - -  AST 15 - 41 U/L - - -  ALT 17 - 63 U/L - - -    Lab Results  Component Value Date   PTH 350.0 (H) 03/21/2007   CALCIUM 8.4 (L) 04/10/2018   CAION 0.95 (L) 01/18/2017   PHOS 4.5 04/07/2018       Component Value Date/Time   COLORURINE YELLOW 10/12/2013 [place] 10/12/2013 1045   LABSPEC 1.015 10/12/2013 1045   PHURINE 8.0 10/12/2013 1045   GLUCOSEU NEGATIVE 10/12/2013 1045   HGBUR MODERATE (A) 10/12/2013 1045   BILIRUBINUR NEGATIVE 10/12/2013 [place] 10/12/2013 1045  PROTEINUR 30 (A) 10/12/2013 1045   UROBILINOGEN 1.0 10/12/2013 1045   NITRITE NEGATIVE 10/12/2013 1045   LEUKOCYTESUR NEGATIVE 10/12/2013 1045      Component Value Date/Time   PHART 7.405 10/12/2013 0352   PCO2ART 32.2 (L) 10/12/2013 0352   PO2ART 111.0 (H) 10/12/2013 0352   HCO3 20.4 10/12/2013 0352   TCO2 28 01/18/2017 1346   ACIDBASEDEF 3.9 (H) 10/12/2013 0352   O2SAT 99.1 10/12/2013 0352       Component Value Date/Time   IRON 46 03/16/2007 0610   TIBC 358 03/16/2007 0610   FERRITIN 237 03/16/2007 0610   IRONPCTSAT 13 (L) 03/16/2007 0610    Hemodialysis prescription: TTS, 4 hours 15 minutes, [facility].  Dry weight 97 kg.  Left brachiocephalic fistula.   ASSESSMENT/PLAN:     1. Bilateral arterial insufficiency- with cellulitis and gangrenous changes.  Per VVS and primary service., S/p angio, s/p TMTs on both sides  2/5, s/p LBKA 2/15. 2. ESRD continue with HD qTTS Dr.  Lowanda Foster, on HD.  Below EDW losing weight.  Poor po intake.  Will need new EDW on D/C. Cont on schedule:  4h8min, AVF, no heparin. 2K.   3. Anemia: cont with ESA.  Hemoglobin stable in 9s, cont ESA. 4. CKD-MBD: continue with binders and vit D 5. Nutrition: renal diet 6. Hypertension: not a current issue, see below for a fib. 7. DM- stable 8. Afib: on cardizem and metoprolol, Coumadin per pharmacy. Cardiology involved now given tachycardia - midodrine added by cardiology to allow titration of blocking agents.   Pearson Grippe MD

## 2018-04-10 NOTE — Progress Notes (Signed)
ANTICOAGULATION CONSULT NOTE - Follow-Up  Pharmacy Consult for Heparin  Indication: atrial fibrillation  Patient Measurements: Height: 6\' 2"  (188 cm) Weight: 203 lb 7.8 oz (92.3 kg) IBW/kg (Calculated) : 82.2  Vital Signs: Temp: 98.6 F (37 C) (02/17 1219) Temp Source: Oral (02/17 1219) BP: 123/72 (02/17 1219) Pulse Rate: 93 (02/17 1219)  Labs: Recent Labs    04/07/18 2316  04/09/18 0406  04/09/18 1819 04/10/18 0344 04/10/18 0945 04/10/18 1202  HGB 9.6*  --  9.2*  --   --  9.5*  --   --   HCT 30.3*  --  28.6*  --   --  31.5*  --   --   PLT 232  --  211  --   --  224  --   --   HEPARINUNFRC 0.10*   < >  --    < > 0.34  --  1.10* >2.20*  CREATININE 7.98*  --  5.00*  --   --  6.72*  --   --    < > = values in this interval not displayed.    Estimated Creatinine Clearance: 12.9 mL/min (A) (by C-G formula based on SCr of 6.72 mg/dL (H)).  Assessment: 63 year old male on warfarin prior to admission for Afib currently on hold post BKA 2/15.  Pt is being bridged with heparin.   Warfarin on hold for now as possible BKA next week.   Heparin level is now supra-therapeutic at 1 and >2 however unsure if these are accurate because drawn close to where heparin infusing in right arm - L arm - restricted for HD fistula.  No bleeding but plan for OR in am - will decrease heparin rate.    CBC stable.    Goal of Therapy:  Heparin level 0.3-0.7 units/ml Monitor platelets by anticoagulation protocol: Yes   Plan:  Decrease heparin drip 1400 units/hr Daily heparin level and CBC while on therapy.   Bonnita Nasuti Pharm.D. CPP, BCPS Clinical Pharmacist [phone] 04/10/2018 3:10 PM

## 2018-04-10 NOTE — Progress Notes (Signed)
PROGRESS NOTE    Albert [name]  CBJ:[id number] DOB: [birth year] DOA: 03/20/2018 PCP: Redmond School, MD   Brief Narrative: 63 year old male with history of chronic A. fib on Coumadin, diabetes mellitus type 2, ESRD on HD TTS  with left lower extremity cellulitis, PVD. Patient was admitted at [facility] on 03/20/2018 with cellulitis of the lower extremity after presenting from home with a left heel discomfort.  ABI was performed that showed compromised blood flow.  X-ray of the heel did not show osteomyelitis.  Patient was transferred to [facility] is going for arteriogram after INR was acceptable.  Underwent arteriogram on 2/3 and on 2/5 had left transmetatarsal of 2nd toe and transmetatarsal amputation of 1 2 and 3 right toes.  Patient has been in A. fib intermittently hypotensive and rate poorly controlled.  Cardiology consulted 2/12. Patient Coumadin was reversed for surgery and placed on heparin drip. underwent successful left BKA on 2/15. He will need inpatient rehab moving forward, he lives alone at home.  Subjective: Pain is controlled.  No acute events overnight. Denies nausea, vomiting, chest pain.  Heart rate is controlled.  Assessment & Plan:   Cellulitis of left lower extremity/Bilateral dry gangrenous toes/severe bilateral peripheral vascular disease: s/p Lt BKA  and Rt 1, 2 and 3 transmetatarsal amputation-and having foul-smelling. Vascular surgery following and looking on possible procedure on the right leg too- coumadin on hold for now. Patient completed Ancef 2/9.  Chronic Atrial fibrillation:Rate now controlled, started on Amiodarone.Coreg has been discontinued and switched to metoprolol due to soft BP.INR reversed for OR and on heparin drip-holding Coumadin until okay with vascular surgery given that there is possible plan for right leg intervention.  Appreciate cardiology on board.  Hypotension: BP improved.Patient is placed on midodrine  since 2/12.   Essential  hypertension, benign : Blood pressure stable.Pt on Cardizem and Lopressor for A. fib rate control.   ESRD on HD HYW:[id number] on board, next HD 1/18  Anemia of chronic renal disease, continue with ESA, hemoglobin stable Recent Labs  Lab 04/06/18 0256 04/07/18 0403 04/07/18 2316 04/09/18 0406 04/10/18 0344  HGB 10.3* 10.3* 9.6* 9.2* 9.5*  HCT 33.4* 32.6* 30.3* 28.6* 31.5*    Diabetes mellitus, non-insulin-dependent.  Home tradjenta and glipizide on hold.Blood sugar well controlled as below.  Continue ssi and monitor. Recent Labs  Lab 04/09/18 0630 04/09/18 1106 04/09/18 1629 04/09/18 2217 04/10/18 0617  GLUCAP 152* 223* 169* 173* 148*   DVT prophylaxis: sq heparin Code Status: full Family Communication: none at bedside  Disposition Plan: Remains inpatient. Awating plan regarding right leg from vascular surgery.  Will eventually need rehab.  CIR on consult   Consultants:  Nephrology Vascular surgery Cardiology  Procedures:  03/27/18 Arteriogram with balloon angioplasty of the left posterior tibial artery Aorta and iliac segments are heavily calcified and tortuous however there is no flow-limiting stenosis.  SFAs are heavily calcified no flow-limiting stenosis.  Right side appears to have runoff via the posterior tibial and anterior tibial arteries without flow-limiting stenosis.  Left side has runoff via the anterior tibial and peroneal posterior tibial occludes above the ankle.  After intervention there is less than 30% residual stenosis and a strong signal at the ankle.Patient will need amputation of bilateral second toes at least and we will watch the ulceration of his heel on the left but he remains high risk for proximal leg amputations bilaterally.  03/29/18 1. left transmetatarsal amputation second toe. 2. Large amount of necrotic debris and gangrenous tissue  toes 1 2 and 3 right foot requiring Transmetatarsal amputation of these rather than isolated second toe  amputation.  04/08/18 LT BKA  Antimicrobials: Anti-infectives (From admission, onward)   Start     Dose/Rate Route Frequency Ordered Stop   04/09/18 0900  ceFAZolin (ANCEF) IVPB 1 g/50 mL premix     1 g 100 mL/hr over 30 Minutes Intravenous Every 24 hours 04/08/18 1614 04/09/18 1128   04/08/18 1130  ceFAZolin (ANCEF) IVPB 2g/100 mL premix     2 g 200 mL/hr over 30 Minutes Intravenous To Short Stay 04/08/18 1118 04/08/18 1251   04/08/18 1120  ceFAZolin (ANCEF) 2-4 GM/100ML-% IVPB    Note to Pharmacy:  Henrine Screws   : cabinet override      04/08/18 1120 04/08/18 1251   04/07/18 0600  ceFAZolin (ANCEF) IVPB 2g/100 mL premix     2 g 200 mL/hr over 30 Minutes Intravenous To Short Stay 04/06/18 1510 04/08/18 0600   03/20/18 2200  ceFAZolin (ANCEF) IVPB 1 g/50 mL premix  Status:  Discontinued     1 g 100 mL/hr over 30 Minutes Intravenous Every 24 hours 03/20/18 2112 04/02/18 0917       Objective: Vitals:   04/09/18 2002 04/09/18 2127 04/10/18 0630 04/10/18 0753  BP: 108/72 128/77 119/75 115/76  Pulse:  86    Resp: 18  18 18   Temp: 98.1 F (36.7 C)  98 F (36.7 C) 98.1 F (36.7 C)  TempSrc: Oral  Oral Oral  SpO2: 94%  95% 95%  Weight:      Height:        Intake/Output Summary (Last 24 hours) at 04/10/2018 0916 Last data filed at 04/10/2018 0800 Gross per 24 hour  Intake 1220.44 ml  Output -  Net 1220.44 ml   Filed Weights   04/08/18 0623 04/08/18 1842 04/08/18 2333  Weight: 93.5 kg 92.8 kg 92.3 kg   Weight change:   Body mass index is 26.13 kg/m.  Intake/Output from previous day: 02/16 0701 - 02/17 0700 In: 1220.4 [P.O.:720; I.V.:500.4] Out: -  Intake/Output this shift: Total I/O In: 240 [P.O.:240] Out: -   Examination:  General exam: Calm, comfortable, not in acute distress, older for age, average built.  HEENT:Oral mucosa moist, Ear/Nose WNL grossly, dentition normal. Respiratory system: Bilateral equal air entry, no crackles and wheezing, no use of  accessory muscle, non tender on palpation. Cardiovascular system: regular rate and rhythm, S1 & S2 heard, No JVD/murmurs. Gastrointestinal system: Abdomen soft, non-tender, non-distended, BS +. No hepatosplenomegaly palpable. Nervous System:Alert, awake and oriented at baseline. Able to move UE and LE, sensation intact. Extremities: left BKA stump in dressing, rt TMA  w intact staples no edema, distal peripheral pulses palpable.  Skin: No rashes,no icterus. MSK: Normal muscle bulk,tone, power  Medications:  Scheduled Meds: . allopurinol  100 mg Oral Daily  . [START ON 04/14/2018] amiodarone  200 mg Oral Daily  . amiodarone  400 mg Oral BID  . aspirin EC  81 mg Oral Daily  . Chlorhexidine Gluconate Cloth  6 each Topical Q0600  . Chlorhexidine Gluconate Cloth  6 each Topical Q0600  . cinacalcet  30 mg Oral Q supper  . darbepoetin (ARANESP) injection - DIALYSIS  100 mcg Intravenous Q Sat-HD  . diltiazem  180 mg Oral Daily  . doxercalciferol  3.5 mcg Intravenous Q T,Th,Sa-HD  . insulin aspart  0-9 Units Subcutaneous TID WC  . metoprolol tartrate  50 mg Oral BID  . midodrine  10 mg Oral TID WC  . pantoprazole  40 mg Oral Daily  . pravastatin  5 mg Oral QPM  . senna-docusate  1 tablet Oral BID  . sevelamer carbonate  1,600 mg Oral With snacks  . sevelamer carbonate  3,200 mg Oral TID WC  . sodium chloride flush  3 mL Intravenous Q12H   Continuous Infusions: . sodium chloride    . sodium chloride    . sodium chloride    . sodium chloride Stopped (04/09/18 1426)  . heparin 1,650 Units/hr (04/10/18 0629)    Data Reviewed: I have personally reviewed following labs and imaging studies  CBC: Recent Labs  Lab 04/06/18 0256 04/07/18 0403 04/07/18 2316 04/09/18 0406 04/10/18 0344  WBC 9.6 10.1 10.6* 15.0* 15.8*  HGB 10.3* 10.3* 9.6* 9.2* 9.5*  HCT 33.4* 32.6* 30.3* 28.6* 31.5*  MCV 88.6 89.1 88.9 88.8 89.2  PLT 236 243 232 211 277   Basic Metabolic Panel: Recent Labs  Lab  04/04/18 0401  04/04/18 1429 04/05/18 0315 04/06/18 0256 04/07/18 0403 04/07/18 2316 04/09/18 0406 04/10/18 0344  NA  --    < > 133* 135 135 137 133* 136 136  K  --    < > 4.1 3.7 3.8 4.4 4.3 4.2 4.6  CL  --    < > 95* 95* 93* 96* 93* 96* 94*  CO2  --    < > 22 27 23 28 24 24 24   GLUCOSE  --    < > 129* 146* 147* 152* 185* 208* 159*  BUN  --    < > 74* 31* 49* 30* 43* 23 38*  CREATININE  --    < > 10.34* 6.13* 8.53* 6.42* 7.98* 5.00* 6.72*  CALCIUM  --    < > 8.7* 8.5* 8.5* 8.6* 8.5* 8.3* 8.4*  MG 1.9  --   --   --   --   --   --   --   --   PHOS  --   --  4.2 4.1  --   --  4.5  --   --    < > = values in this interval not displayed.   GFR: Estimated Creatinine Clearance: 12.9 mL/min (A) (by C-G formula based on SCr of 6.72 mg/dL (H)). Liver Function Tests: Recent Labs  Lab 04/04/18 1429 04/05/18 0315 04/07/18 2316  ALBUMIN 1.7* 1.8* 1.7*   No results for input(s): LIPASE, AMYLASE in the last 168 hours. No results for input(s): AMMONIA in the last 168 hours. Coagulation Profile: Recent Labs  Lab 04/04/18 0401 04/05/18 0315 04/06/18 0256 04/07/18 0403  INR 3.73 3.87 3.10 1.59   Cardiac Enzymes: No results for input(s): CKTOTAL, CKMB, CKMBINDEX, TROPONINI in the last 168 hours. BNP (last 3 results) No results for input(s): PROBNP in the last 8760 hours. HbA1C: No results for input(s): HGBA1C in the last 72 hours. CBG: Recent Labs  Lab 04/09/18 0630 04/09/18 1106 04/09/18 1629 04/09/18 2217 04/10/18 0617  GLUCAP 152* 223* 169* 173* 148*   Lipid Profile: No results for input(s): CHOL, HDL, LDLCALC, TRIG, CHOLHDL, LDLDIRECT in the last 72 hours. Thyroid Function Tests: No results for input(s): TSH, T4TOTAL, FREET4, T3FREE, THYROIDAB in the last 72 hours. Anemia Panel: No results for input(s): VITAMINB12, FOLATE, FERRITIN, TIBC, IRON, RETICCTPCT in the last 72 hours. Sepsis Labs: No results for input(s): PROCALCITON, LATICACIDVEN in the last 168 hours.  No  results found for this or any previous visit (from the past 240  hour(s)).    Radiology Studies: No results found.    LOS: 21 days   Time spent: More than 50% of that time was spent in counseling and/or coordination of care.  Antonieta Pert, MD [facility]  04/10/2018, 9:16 AM

## 2018-04-11 ENCOUNTER — Inpatient Hospital Stay (HOSPITAL_COMMUNITY): Payer: Medicare HMO | Admitting: Anesthesiology

## 2018-04-11 ENCOUNTER — Encounter (HOSPITAL_COMMUNITY)
Admission: EM | Disposition: A | Discharge status: Skilled Nursing Facility | Payer: Self-pay | Source: Home / Self Care | Attending: Internal Medicine

## 2018-04-11 DIAGNOSIS — L03115 Cellulitis of right lower limb: Secondary | ICD-10-CM

## 2018-04-11 HISTORY — PX: AMPUTATION: SHX166

## 2018-04-11 LAB — GLUCOSE, CAPILLARY
Glucose-Capillary: 138 mg/dL — ABNORMAL HIGH (ref 70–99)
Glucose-Capillary: 148 mg/dL — ABNORMAL HIGH (ref 70–99)
Glucose-Capillary: 143 mg/dL — ABNORMAL HIGH (ref 70–99)
Glucose-Capillary: 181 mg/dL — ABNORMAL HIGH (ref 70–99)
Glucose-Capillary: 144 mg/dL — ABNORMAL HIGH (ref 70–99)

## 2018-04-11 LAB — CBC
HCT: 30.1 % — ABNORMAL LOW (ref 39.0–52.0)
Hemoglobin: 9.3 g/dL — ABNORMAL LOW (ref 13.0–17.0)
MCH: 27.1 pg (ref 26.0–34.0)
MCHC: 30.9 g/dL (ref 30.0–36.0)
MCV: 87.8 fL (ref 80.0–100.0)
Platelets: 233 10*3/uL (ref 150–400)
RBC: 3.43 MIL/uL — ABNORMAL LOW (ref 4.22–5.81)
RDW: 15.3 % (ref 11.5–15.5)
WBC: 15.3 10*3/uL — ABNORMAL HIGH (ref 4.0–10.5)
nRBC: 0.2 % (ref 0.0–0.2)

## 2018-04-11 LAB — POCT I-STAT 4, (NA,K, GLUC, HGB,HCT)
Glucose, Bld: 136 mg/dL — ABNORMAL HIGH (ref 70–99)
HCT: 30 % — ABNORMAL LOW (ref 39.0–52.0)
HEMOGLOBIN: 10.2 g/dL — AB (ref 13.0–17.0)
Potassium: 5.1 mmol/L (ref 3.5–5.1)
Sodium: 132 mmol/L — ABNORMAL LOW (ref 135–145)

## 2018-04-11 LAB — HEPARIN LEVEL (UNFRACTIONATED): Heparin Unfractionated: 0.13 IU/mL — ABNORMAL LOW (ref 0.30–0.70)

## 2018-04-11 SURGERY — AMPUTATION BELOW KNEE
Anesthesia: General | Laterality: Right

## 2018-04-11 MED ORDER — FENTANYL CITRATE (PF) 250 MCG/5ML IJ SOLN
INTRAMUSCULAR | Status: AC
  Filled 2018-04-11: qty 5

## 2018-04-11 MED ORDER — MIDAZOLAM HCL 2 MG/2ML IJ SOLN
INTRAMUSCULAR | Status: AC
  Filled 2018-04-11: qty 2

## 2018-04-11 MED ORDER — ONDANSETRON HCL 4 MG/2ML IJ SOLN
INTRAMUSCULAR | Status: DC | PRN
  Administered 2018-04-11: 4 mg via INTRAVENOUS

## 2018-04-11 MED ORDER — 0.9 % SODIUM CHLORIDE (POUR BTL) OPTIME
TOPICAL | Status: DC | PRN
  Administered 2018-04-11: 1000 mL

## 2018-04-11 MED ORDER — BACLOFEN 5 MG HALF TABLET
5.0000 mg | ORAL_TABLET | Freq: Once | ORAL | Status: AC
  Administered 2018-04-11: 5 mg via ORAL
  Filled 2018-04-11 (×2): qty 1

## 2018-04-11 MED ORDER — LIDOCAINE 2% (20 MG/ML) 5 ML SYRINGE
INTRAMUSCULAR | Status: DC | PRN
  Administered 2018-04-11: 60 mg via INTRAVENOUS

## 2018-04-11 MED ORDER — CEFAZOLIN SODIUM 1 G IJ SOLR
INTRAMUSCULAR | Status: AC
  Filled 2018-04-11: qty 20

## 2018-04-11 MED ORDER — SODIUM CHLORIDE 0.9 % IV SOLN
INTRAVENOUS | Status: DC
  Administered 2018-04-11: 09:00:00 via INTRAVENOUS

## 2018-04-11 MED ORDER — FENTANYL CITRATE (PF) 100 MCG/2ML IJ SOLN: 25.0000 ug | INTRAMUSCULAR | Status: DC | PRN

## 2018-04-11 MED ORDER — DEXAMETHASONE SODIUM PHOSPHATE 10 MG/ML IJ SOLN
INTRAMUSCULAR | Status: DC | PRN
  Administered 2018-04-11: 4 mg via INTRAVENOUS

## 2018-04-11 MED ORDER — HEPARIN (PORCINE) 25000 UT/250ML-% IV SOLN
1500.0000 [IU]/h | INTRAVENOUS | Status: DC
  Administered 2018-04-11: 1400 [IU]/h via INTRAVENOUS
  Administered 2018-04-12 – 2018-04-13 (×3): 1500 [IU]/h via INTRAVENOUS
  Filled 2018-04-11 (×4): qty 250

## 2018-04-11 MED ORDER — ACETAMINOPHEN 500 MG PO TABS
1000.0000 mg | ORAL_TABLET | Freq: Once | ORAL | Status: AC
  Administered 2018-04-11: 1000 mg via ORAL

## 2018-04-11 MED ORDER — PROMETHAZINE HCL 25 MG/ML IJ SOLN: 6.2500 mg | INTRAMUSCULAR | Status: DC | PRN

## 2018-04-11 MED ORDER — POLYETHYLENE GLYCOL 3350 17 G PO PACK: 17.0000 g | PACK | Freq: Every day | ORAL | Status: DC | PRN

## 2018-04-11 MED ORDER — BISACODYL 10 MG RE SUPP
10.0000 mg | Freq: Every day | RECTAL | Status: DC | PRN
  Administered 2018-04-12: 10 mg via RECTAL
  Filled 2018-04-11: qty 1

## 2018-04-11 MED ORDER — PROPOFOL 10 MG/ML IV BOLUS
INTRAVENOUS | Status: DC | PRN
  Administered 2018-04-11: 100 mg via INTRAVENOUS

## 2018-04-11 MED ORDER — PROPOFOL 10 MG/ML IV BOLUS
INTRAVENOUS | Status: AC
  Filled 2018-04-11: qty 20

## 2018-04-11 MED ORDER — CEFAZOLIN SODIUM-DEXTROSE 2-3 GM-%(50ML) IV SOLR
INTRAVENOUS | Status: DC | PRN
  Administered 2018-04-11: 2 g via INTRAVENOUS

## 2018-04-11 MED ORDER — ACETAMINOPHEN 500 MG PO TABS
ORAL_TABLET | ORAL | Status: AC
  Filled 2018-04-11: qty 2

## 2018-04-11 MED ORDER — CALCIUM CARBONATE ANTACID 500 MG PO CHEW
1.0000 | CHEWABLE_TABLET | Freq: Three times a day (TID) | ORAL | Status: DC | PRN
  Administered 2018-04-11: 200 mg via ORAL
  Filled 2018-04-11: qty 1

## 2018-04-11 MED ORDER — MIDAZOLAM HCL 2 MG/2ML IJ SOLN
INTRAMUSCULAR | Status: DC | PRN
  Administered 2018-04-11: 1 mg via INTRAVENOUS

## 2018-04-11 MED ORDER — SODIUM CHLORIDE 0.9 % IV SOLN
INTRAVENOUS | Status: DC | PRN
  Administered 2018-04-11: 40 ug/min via INTRAVENOUS

## 2018-04-11 MED ORDER — FENTANYL CITRATE (PF) 100 MCG/2ML IJ SOLN
INTRAMUSCULAR | Status: DC | PRN
  Administered 2018-04-11: 75 ug via INTRAVENOUS
  Administered 2018-04-11: 50 ug via INTRAVENOUS
  Administered 2018-04-11: 25 ug via INTRAVENOUS

## 2018-04-11 SURGICAL SUPPLY — 52 items
BANDAGE ACE 4X5 VEL STRL LF (GAUZE/BANDAGES/DRESSINGS) IMPLANT
BANDAGE ACE 6X5 VEL STRL LF (GAUZE/BANDAGES/DRESSINGS) IMPLANT
BANDAGE ELASTIC 4 VELCRO ST LF (GAUZE/BANDAGES/DRESSINGS) ×2 IMPLANT
BANDAGE ESMARK 6X9 LF (GAUZE/BANDAGES/DRESSINGS) IMPLANT
BLADE LONG MED 31MMX9MM (MISCELLANEOUS)
BLADE LONG MED 31X9 (MISCELLANEOUS) IMPLANT
BLADE SAW GIGLI 510 (BLADE) ×2 IMPLANT
BLADE SAW GIGLI 510MM (BLADE) ×1
BNDG CMPR 9X6 STRL LF SNTH (GAUZE/BANDAGES/DRESSINGS)
BNDG COHESIVE 6X5 TAN STRL LF (GAUZE/BANDAGES/DRESSINGS) ×3 IMPLANT
BNDG ESMARK 6X9 LF (GAUZE/BANDAGES/DRESSINGS)
BNDG GAUZE ELAST 4 BULKY (GAUZE/BANDAGES/DRESSINGS) ×2 IMPLANT
CANISTER SUCT 3000ML PPV (MISCELLANEOUS) ×3 IMPLANT
CLIP VESOCCLUDE MED 6/CT (CLIP) IMPLANT
COVER SURGICAL LIGHT HANDLE (MISCELLANEOUS) ×3 IMPLANT
COVER WAND RF STERILE (DRAPES) ×3 IMPLANT
CUFF TOURNIQUET SINGLE 24IN (TOURNIQUET CUFF) ×2 IMPLANT
CUFF TOURNIQUET SINGLE 34IN LL (TOURNIQUET CUFF) IMPLANT
CUFF TOURNIQUET SINGLE 44IN (TOURNIQUET CUFF) IMPLANT
DRAIN CHANNEL 19F RND (DRAIN) IMPLANT
DRAPE HALF SHEET 40X57 (DRAPES) ×3 IMPLANT
DRAPE ORTHO SPLIT 77X108 STRL (DRAPES) ×6
DRAPE SURG ORHT 6 SPLT 77X108 (DRAPES) ×2 IMPLANT
DRSG ADAPTIC 3X8 NADH LF (GAUZE/BANDAGES/DRESSINGS) ×3 IMPLANT
ELECT REM PT RETURN 9FT ADLT (ELECTROSURGICAL) ×3
ELECTRODE REM PT RTRN 9FT ADLT (ELECTROSURGICAL) ×1 IMPLANT
EVACUATOR SILICONE 100CC (DRAIN) IMPLANT
GAUZE SPONGE 4X4 12PLY STRL (GAUZE/BANDAGES/DRESSINGS) ×3 IMPLANT
GLOVE BIO SURGEON STRL SZ7.5 (GLOVE) ×3 IMPLANT
GOWN STRL REUS W/ TWL LRG LVL3 (GOWN DISPOSABLE) ×2 IMPLANT
GOWN STRL REUS W/ TWL XL LVL3 (GOWN DISPOSABLE) ×1 IMPLANT
GOWN STRL REUS W/TWL LRG LVL3 (GOWN DISPOSABLE) ×6
GOWN STRL REUS W/TWL XL LVL3 (GOWN DISPOSABLE) ×3
KIT BASIN OR (CUSTOM PROCEDURE TRAY) ×3 IMPLANT
KIT TURNOVER KIT B (KITS) ×3 IMPLANT
NS IRRIG 1000ML POUR BTL (IV SOLUTION) ×3 IMPLANT
PACK GENERAL/GYN (CUSTOM PROCEDURE TRAY) ×3 IMPLANT
PAD ARMBOARD 7.5X6 YLW CONV (MISCELLANEOUS) ×6 IMPLANT
STAPLER VISISTAT 35W (STAPLE) ×3 IMPLANT
STOCKINETTE IMPERVIOUS LG (DRAPES) ×3 IMPLANT
SUT BONE WAX W31G (SUTURE) IMPLANT
SUT ETHILON 3 0 PS 1 (SUTURE) IMPLANT
SUT SILK 0 TIES 10X30 (SUTURE) ×3 IMPLANT
SUT SILK 2 0 (SUTURE)
SUT SILK 2 0 SH CR/8 (SUTURE) ×3 IMPLANT
SUT SILK 2-0 18XBRD TIE 12 (SUTURE) ×1 IMPLANT
SUT SILK 3 0 (SUTURE)
SUT SILK 3-0 18XBRD TIE 12 (SUTURE) IMPLANT
SUT VIC AB 2-0 CT1 18 (SUTURE) ×4 IMPLANT
TOWEL GREEN STERILE (TOWEL DISPOSABLE) ×6 IMPLANT
UNDERPAD 30X30 (UNDERPADS AND DIAPERS) ×3 IMPLANT
WATER STERILE IRR 1000ML POUR (IV SOLUTION) ×3 IMPLANT

## 2018-04-11 NOTE — Progress Notes (Signed)
PROGRESS NOTE    Albert [name]  IDP:[id number] DOB: [birth year] DOA: 03/20/2018 PCP: Redmond School, MD   Brief Narrative: 63 year old male with history of chronic A. fib on Coumadin, diabetes mellitus type 2, ESRD on HD TTS  with left lower extremity cellulitis, PVD. Patient was admitted at [facility] on 03/20/2018 with cellulitis of the lower extremity after presenting from home with a left heel discomfort.  ABI was performed that showed compromised blood flow.  X-ray of the heel did not show osteomyelitis.  Patient was transferred to [facility] is going for arteriogram after INR was acceptable.  Underwent arteriogram on 2/3 and on 2/5 had left transmetatarsal of 2nd toe and transmetatarsal amputation of 1 2 and 3 right toes.  Patient has been in A. fib intermittently hypotensive and rate poorly controlled.  Cardiology consulted 2/12. Patient Coumadin was reversed for surgery and placed on heparin drip. underwent successful left BKA on 2/15. He will need inpatient rehab moving forward, he lives alone at home.  Due to poor healing and foul-smelling of the right TMA wound, underwent a right BKA 2/18.   Subjective: Complains of hiccups. Pain is controlled.  2 sisters at the bedside. Nausea, vomiting, chest pain, shortness of breath. Status post OR this morning.  Assessment & Plan:   Cellulitis of left lower extremity/Bilateral dry gangrenous toes/severe bilateral peripheral vascular disease: s/p Rt 1, 2 and 3 transmetatarsal amputation was still having foul smell wound and had Rt BKA 2/28. S/p lty BKA on 2/15. Vascular surgery following and appreciate inputs.Patient completed Ancef 2/9.  Persistent Atrial fibrillation:Rate now controlled, started on Amiodarone.Coreg has been discontinued and switched to metoprolol due to soft BP.INR reversed for OR and on heparin drip-holding Coumadin until okay with vascular surgery, starting heparin drip at 4.Hopefully we can resume coumadin in  am-await Vas surgery input.  Hypotension: BP is stable. Patient is placed on midodrine  since 2/12.   Essential hypertension, benign : Blood pressure stable. Pt on Cardizem and Lopressor for A. fib rate control.   ESRD on HD WER:[id number] on board for HD .  Anemia of chronic renal disease: Hemoglobin overall stable as below.  Continue with ESA,  Recent Labs  Lab 04/07/18 2316 04/09/18 0406 04/10/18 0344 04/11/18 0240 04/11/18 0847  HGB 9.6* 9.2* 9.5* 9.3* 10.2*  HCT 30.3* 28.6* 31.5* 30.1* 30.0*    Diabetes mellitus, non-insulin-dependent.  Home tradjenta and glipizide on hold.Blood sugar well controlled as below.  Continue ssi and monitor. Recent Labs  Lab 04/10/18 1142 04/10/18 1625 04/10/18 2106 04/11/18 0636 04/11/18 1040  GLUCAP 162* 149* 141* 138* 148*   DVT prophylaxis: heparin Code Status: full Family Communication: none at bedside  Disposition Plan: Remains inpatient. Will eventually need rehab.  CIR on consult   Consultants:  Nephrology Vascular surgery Cardiology  Procedures:  03/27/18 Arteriogram with balloon angioplasty of the left posterior tibial artery Aorta and iliac segments are heavily calcified and tortuous however there is no flow-limiting stenosis.  SFAs are heavily calcified no flow-limiting stenosis.  Right side appears to have runoff via the posterior tibial and anterior tibial arteries without flow-limiting stenosis.  Left side has runoff via the anterior tibial and peroneal posterior tibial occludes above the ankle.  After intervention there is less than 30% residual stenosis and a strong signal at the ankle.Patient will need amputation of bilateral second toes at least and we will watch the ulceration of his heel on the left but he remains high risk for proximal leg amputations  bilaterally.  03/29/18 1. left transmetatarsal amputation second toe. 2. Large amount of necrotic debris and gangrenous tissue toes 1 2 and 3 right foot requiring  Transmetatarsal amputation of these rather than isolated second toe amputation.  04/08/18 LT BKA  04/11/18 Rt BKA  Antimicrobials: Anti-infectives (From admission, onward)   Start     Dose/Rate Route Frequency Ordered Stop   04/09/18 0900  ceFAZolin (ANCEF) IVPB 1 g/50 mL premix     1 g 100 mL/hr over 30 Minutes Intravenous Every 24 hours 04/08/18 1614 04/09/18 1128   04/08/18 1130  ceFAZolin (ANCEF) IVPB 2g/100 mL premix     2 g 200 mL/hr over 30 Minutes Intravenous To Short Stay 04/08/18 1118 04/08/18 1251   04/08/18 1120  ceFAZolin (ANCEF) 2-4 GM/100ML-% IVPB    Note to Pharmacy:  Henrine Screws   : cabinet override      04/08/18 1120 04/08/18 1251   04/07/18 0600  ceFAZolin (ANCEF) IVPB 2g/100 mL premix     2 g 200 mL/hr over 30 Minutes Intravenous To Short Stay 04/06/18 1510 04/08/18 0600   03/20/18 2200  ceFAZolin (ANCEF) IVPB 1 g/50 mL premix  Status:  Discontinued     1 g 100 mL/hr over 30 Minutes Intravenous Every 24 hours 03/20/18 2112 04/02/18 0917       Objective: Vitals:   04/11/18 1126 04/11/18 1140 04/11/18 1155 04/11/18 1200  BP: 124/71 117/60 127/73 122/69  Pulse: 89  85 86  Resp: 19 16 18 17   Temp: (!) 97.2 F (36.2 C) 97.7 F (36.5 C)    TempSrc:  Oral    SpO2: 96% 91% 90% 90%  Weight:      Height:        Intake/Output Summary (Last 24 hours) at 04/11/2018 1323 Last data filed at 04/11/2018 1128 Gross per 24 hour  Intake 957.22 ml  Output 25 ml  Net 932.22 ml   Filed Weights   04/08/18 1842 04/08/18 2333 04/11/18 0500  Weight: 92.8 kg 92.3 kg 93.1 kg   Weight change:   Body mass index is 26.35 kg/m.  Intake/Output from previous day: 02/17 0701 - 02/18 0700 In: 927.2 [P.O.:360; I.V.:567.2] Out: -  Intake/Output this shift: Total I/O In: 270 [P.O.:30; I.V.:240] Out: 25 [Blood:25]  Examination:  General exam:calm,comfortable, NAD, older for age, average built.  HEENT:Oral mucosa moist, Ear/Nose WNL grossly, dentition  normal. Respiratory system: Bilateral equal air entry, no crackles and wheezing, no use of accessory muscle, non tender on palpation. Cardiovascular system: irregular rate and rhythm, S1 & S2 heard, No JVD/murmurs. Gastrointestinal system: Abdomen soft, non-tender, non-distended, BS +. No hepatosplenomegaly palpable. Nervous System:Alert, awake and oriented at baseline. Able to move UE and LE, sensation intact. Extremities: B/L BKA w/ dressing intact. Skin: No rashes,no icterus. MSK: Normal muscle bulk,tone, power  Medications:  Scheduled Meds: . acetaminophen      . allopurinol  100 mg Oral Daily  . [START ON 04/14/2018] amiodarone  200 mg Oral Daily  . amiodarone  400 mg Oral BID  . aspirin EC  81 mg Oral Daily  . Chlorhexidine Gluconate Cloth  6 each Topical Q0600  . Chlorhexidine Gluconate Cloth  6 each Topical Q0600  . cinacalcet  30 mg Oral Q supper  . darbepoetin (ARANESP) injection - DIALYSIS  100 mcg Intravenous Q Sat-HD  . diltiazem  180 mg Oral Daily  . doxercalciferol  3.5 mcg Intravenous Q T,Th,Sa-HD  . insulin aspart  0-9 Units Subcutaneous TID WC  .  metoprolol tartrate  50 mg Oral BID  . midodrine  10 mg Oral TID WC  . pantoprazole  40 mg Oral Daily  . pravastatin  5 mg Oral QPM  . senna-docusate  1 tablet Oral BID  . sevelamer carbonate  1,600 mg Oral With snacks  . sevelamer carbonate  3,200 mg Oral TID WC  . sodium chloride flush  3 mL Intravenous Q12H   Continuous Infusions: . sodium chloride    . sodium chloride    . sodium chloride    . sodium chloride Stopped (04/09/18 1426)  . heparin      Data Reviewed: I have personally reviewed following labs and imaging studies  CBC: Recent Labs  Lab 04/07/18 0403 04/07/18 2316 04/09/18 0406 04/10/18 0344 04/11/18 0240 04/11/18 0847  WBC 10.1 10.6* 15.0* 15.8* 15.3*  --   HGB 10.3* 9.6* 9.2* 9.5* 9.3* 10.2*  HCT 32.6* 30.3* 28.6* 31.5* 30.1* 30.0*  MCV 89.1 88.9 88.8 89.2 87.8  --   PLT 243 232 211 224  233  --    Basic Metabolic Panel: Recent Labs  Lab 04/04/18 1429 04/05/18 0315 04/06/18 0256 04/07/18 0403 04/07/18 2316 04/09/18 0406 04/10/18 0344 04/11/18 0847  NA 133* 135 135 137 133* 136 136 132*  K 4.1 3.7 3.8 4.4 4.3 4.2 4.6 5.1  CL 95* 95* 93* 96* 93* 96* 94*  --   CO2 22 27 23 28 24 24 24   --   GLUCOSE 129* 146* 147* 152* 185* 208* 159* 136*  BUN 74* 31* 49* 30* 43* 23 38*  --   CREATININE 10.34* 6.13* 8.53* 6.42* 7.98* 5.00* 6.72*  --   CALCIUM 8.7* 8.5* 8.5* 8.6* 8.5* 8.3* 8.4*  --   PHOS 4.2 4.1  --   --  4.5  --   --   --    GFR: Estimated Creatinine Clearance: 12.9 mL/min (A) (by C-G formula based on SCr of 6.72 mg/dL (H)). Liver Function Tests: Recent Labs  Lab 04/04/18 1429 04/05/18 0315 04/07/18 2316  ALBUMIN 1.7* 1.8* 1.7*   No results for input(s): LIPASE, AMYLASE in the last 168 hours. No results for input(s): AMMONIA in the last 168 hours. Coagulation Profile: Recent Labs  Lab 04/05/18 0315 04/06/18 0256 04/07/18 0403  INR 3.87 3.10 1.59   Cardiac Enzymes: No results for input(s): CKTOTAL, CKMB, CKMBINDEX, TROPONINI in the last 168 hours. BNP (last 3 results) No results for input(s): PROBNP in the last 8760 hours. HbA1C: No results for input(s): HGBA1C in the last 72 hours. CBG: Recent Labs  Lab 04/10/18 1142 04/10/18 1625 04/10/18 2106 04/11/18 0636 04/11/18 1040  GLUCAP 162* 149* 141* 138* 148*   Lipid Profile: No results for input(s): CHOL, HDL, LDLCALC, TRIG, CHOLHDL, LDLDIRECT in the last 72 hours. Thyroid Function Tests: No results for input(s): TSH, T4TOTAL, FREET4, T3FREE, THYROIDAB in the last 72 hours. Anemia Panel: No results for input(s): VITAMINB12, FOLATE, FERRITIN, TIBC, IRON, RETICCTPCT in the last 72 hours. Sepsis Labs: No results for input(s): PROCALCITON, LATICACIDVEN in the last 168 hours.  No results found for this or any previous visit (from the past 240 hour(s)).    Radiology Studies: No results  found.    LOS: 22 days   Time spent: More than 50% of that time was spent in counseling and/or coordination of care.  Antonieta Pert, MD [facility]  04/11/2018, 1:23 PM

## 2018-04-11 NOTE — Progress Notes (Signed)
PT Cancellation Note  Patient Details Name: Albert [name] MRN: [id number] DOB: [birth year]   Cancelled Treatment:    Reason Eval/Treat Not Completed: Patient at procedure or test/unavailable(OR)   Duncan Dull 04/11/2018, 8:44 AM

## 2018-04-11 NOTE — Progress Notes (Addendum)
Pt transferred to hemodialysis via bed with transport. Will re-access pt when he comes back.

## 2018-04-11 NOTE — Progress Notes (Signed)
  Progress Note    04/11/2018 8:56 AM Day of Surgery  Subjective:  No overnight issues  Vitals:   04/11/18 0708 04/11/18 0848  BP: (!) 111/59 116/67  Pulse: 88 90  Resp: (!) 21   Temp: 98.4 F (36.9 C)   SpO2: 94%     Physical Exam: [id number] Non labored respirations Dressing on right foot with blood drainage and foul smell  CBC    Component Value Date/Time   WBC 15.3 (H) 04/11/2018 0240   RBC 3.43 (L) 04/11/2018 0240   HGB 10.2 (L) 04/11/2018 0847   HCT 30.0 (L) 04/11/2018 0847   PLT 233 04/11/2018 0240   MCV 87.8 04/11/2018 0240   MCH 27.1 04/11/2018 0240   MCHC 30.9 04/11/2018 0240   RDW 15.3 04/11/2018 0240   LYMPHSABS 0.7 03/20/2018 1931   MONOABS 0.7 03/20/2018 1931   EOSABS 0.1 03/20/2018 1931   BASOSABS 0.0 03/20/2018 1931    BMET    Component Value Date/Time   NA 132 (L) 04/11/2018 0847   K 5.1 04/11/2018 0847   CL 94 (L) 04/10/2018 0344   CO2 24 04/10/2018 0344   GLUCOSE 136 (H) 04/11/2018 0847   BUN 38 (H) 04/10/2018 0344   CREATININE 6.72 (H) 04/10/2018 0344   CALCIUM 8.4 (L) 04/10/2018 0344   CALCIUM 9.0 03/21/2007 0415   GFRNONAA 8 (L) 04/10/2018 0344   GFRAA 9 (L) 04/10/2018 0344    INR    Component Value Date/Time   INR 1.59 04/07/2018 0403     Intake/Output Summary (Last 24 hours) at 04/11/2018 0856 Last data filed at 04/11/2018 0345 Gross per 24 hour  Intake 687.22 ml  Output -  Net 687.22 ml     Assessment:  63 y.o. male is s/p left below-knee amputation with transmetatarsal amputation on the right that is nonhealing  Plan: OR today for right below-knee amputation We will restart heparin drip postop and can transition to Coumadin.   Jahniyah Revere C. Donzetta Matters, MD [facility] Office: [phone] Pager: [phone]  04/11/2018 8:56 AM

## 2018-04-11 NOTE — Progress Notes (Signed)
ANTICOAGULATION CONSULT NOTE - Follow-Up  Pharmacy Consult for Heparin  Indication: atrial fibrillation  Patient Measurements: Height: 6\' 2"  (188 cm) Weight: 205 lb 4 oz (93.1 kg) IBW/kg (Calculated) : 82.2  Vital Signs: Temp: 97.7 F (36.5 C) (02/18 1140) Temp Source: Oral (02/18 1140) BP: 117/60 (02/18 1140) Pulse Rate: 89 (02/18 1126)  Labs: Recent Labs    04/09/18 0406  04/10/18 0344 04/10/18 0945 04/10/18 1202 04/11/18 0240 04/11/18 0847  HGB 9.2*  --  9.5*  --   --  9.3* 10.2*  HCT 28.6*  --  31.5*  --   --  30.1* 30.0*  PLT 211  --  224  --   --  233  --   HEPARINUNFRC  --    < >  --  1.10* >2.20* 0.13*  --   CREATININE 5.00*  --  6.72*  --   --   --   --    < > = values in this interval not displayed.    Estimated Creatinine Clearance: 12.9 mL/min (A) (by C-G formula based on SCr of 6.72 mg/dL (H)).  Assessment: 63 year old male on warfarin prior to admission for Afib currently on hold post L BKA 2/15.  Pt is being bridged with heparin and s/p R BKA on 2/18. -heparin to be restarted at 4pm per MD -hg= 10.2   Goal of Therapy:  Heparin level 0.3-0.7 units/ml Monitor platelets by anticoagulation protocol: Yes   Plan:  -Restart heparin at 1400 units/hr at 4pm -Heparin level in 8 hours and daily wth CBC daily -PT/INR in am  Hildred Laser, PharmD Clinical Pharmacist **Pharmacist phone directory can now be found on [email or web address] ([email or web address]).  Listed under [place].

## 2018-04-11 NOTE — Op Note (Signed)
    Patient name: Albert [name] MRN: [id number] DOB: Jul 01, 1955 Sex: male  04/11/2018 Pre-operative Diagnosis: Gangrenous right foot Post-operative diagnosis:  Same Surgeon:  Erlene Quan C. Donzetta Matters, MD Procedure Performed: Right below-knee amputation  Indications: 63 year old male has undergone left below-knee amputation as well as transmetatarsal amputation of 3 of his right toes.  This is subsequently become gangrenous as well and is indicated for below-knee amputation.  Findings: Vessels were severely calcified.  All muscle was viable.   Procedure:  The patient was identified in the holding area and taken to the operating room where is placed supine operative general anesthesia was induced.  Sterilely prepped draped in the right lower extremity in the usual fashion a timeout was called.  At two thirds one third incision was marked out on the skin.  An Esmarch was used to exsanguinate the right lower extremity tourniquet was inflated 250 mmHg.  Incision was made previously marked out areas.  Cautery was used to carry down to bone.  Tibia was divided with Gigli saw.  Fibula was transected with bone clipper.  Posterior flap was created with amputation knife.  Blood vessels were clamped and tourniquet was allowed down.  All vessels were suture-ligated.  Nerve was pulled on tension and divided.  Hemostasis obtained the wound was irrigated and closed with interrupted 2-0 Vicryl suture at the level of the fascia.  Skin was closed with staples.  He was allowed awaken anesthesia having tolerated procedure without immediate complication.  EBL: 50 cc   [name] C. Donzetta Matters, MD [facility] Office: [phone] Pager: [phone]

## 2018-04-11 NOTE — Clinical Social Work Note (Addendum)
Clinical Social Work Assessment  Patient Details  Name: Albert [name] MRN: [id number] Date of Birth: Nov 20, 1955  Date of referral:  04/10/18               Reason for consult:  Facility Placement                Permission sought to share information with:  Facility Sport and exercise psychologist, Family Supports Permission granted to share information::  Yes, Verbal Permission Granted  Name::     [place]::  SNFs   Relationship::  sister  Contact Information:  [phone]  Housing/Transportation Living arrangements for the past 2 months:  Single Family Home Source of Information:  Patient Patient Interpreter Needed:  None Criminal Activity/Legal Involvement Pertinent to Current Situation/Hospitalization:  No - Comment as needed Significant Relationships:  Siblings Lives with:  Self Do you feel safe going back to the place where you live?  No Need for family participation in patient care:  Yes (Comment)  Care giving concerns:  CSW received consult for discharge needs. CSW spoke with patient regarding PT recommendation of SNF placement at time of discharge. Patient states he lives home alone. Patient states he may have to relocate because his apartment is not wheel chair accessible.   Social Worker assessment / plan:  CSW spoke with patient concerning possibility of rehab at [facility] before returning home.   Patient states he receives dialysis T, TH, Sat at [facility] (6:15am).  Patient states he usually drives himself to dialysis but his family can transport him or he can use [facility] if transportation becomes a issue for placement.    Employment status:  Disabled (Comment on whether or not currently receiving Disability) Insurance information:  Medicare PT Recommendations:  [place] / Referral to community resources:  [place]  Patient/Family's Response to care: Patient recognizes need for rehab  before returning home and is agreeable to a SNF placement. Patient reported preference for SNF near his home. Patient gave CSW permission to send out referrals near his home. CSW will provide patient with SNF listings near his home to review to help the patient narrow down his choices.    Patient/Family's Understanding of and Emotional Response to Diagnosis, Current Treatment, and Prognosis:  Patient is realistic regarding therapy needs and expressed being hopeful for SNF placement. CSW inquired how the pateint feels about having to relocate. The patient states it does not bother him that he may have relocate. Patient expressed understanding of CSW role and discharge process as well as medical condition. No questions/concerns about plan or treatment at this time.   Emotional Assessment Appearance:  Appears stated age Attitude/Demeanor/Rapport:  Engaged Affect (typically observed):  Appropriate, Accepting Orientation:  Oriented to Situation, Oriented to  Time, Oriented to Place, Oriented to Self Alcohol / Substance use:  Illicit Drugs Psych involvement (Current and /or in the community):  No (Comment)  Discharge Needs  Concerns to be addressed:  Care Coordination Readmission within the last 30 days:  No Current discharge risk:  Dependent with Mobility Barriers to Discharge:  Continued Medical Work up   [employer], [place] 04/11/2018, 2:08 PM

## 2018-04-11 NOTE — Progress Notes (Addendum)
Report given to [facility] in hemodialysis.

## 2018-04-11 NOTE — Transfer of Care (Signed)
Immediate Anesthesia Transfer of Care Note  Patient: Albert [name]  Procedure(s) Performed: RIGHT BELOW KNEE AMPUTATION (Right )  Patient Location: PACU  Anesthesia Type:General  Level of Consciousness: awake, alert  and oriented  Airway & Oxygen Therapy: Patient Spontanous Breathing and Patient connected to nasal cannula oxygen  Post-op Assessment: Report given to RN, Post -op Vital signs reviewed and stable and Patient moving all extremities  Post vital signs: Reviewed and stable  Last Vitals:  Vitals Value Taken Time  BP 98/67 04/11/2018 10:39 AM  Temp    Pulse 80 04/11/2018 10:45 AM  Resp 19 04/11/2018 10:45 AM  SpO2 95 % 04/11/2018 10:45 AM  Vitals shown include unvalidated device data.  Last Pain:  Vitals:   04/11/18 0708  TempSrc: Oral  PainSc: 0-No pain      Patients Stated Pain Goal: 5 (42/90/37 [id number])  Complications: No apparent anesthesia complications

## 2018-04-11 NOTE — Consult Note (Signed)
   [facility] CM Inpatient Consult   04/11/2018  Albert [name] 14-Jul-1955 [id number]   [facility] Care Management hospital liaison follow up.  Spoke with inpatient RNCM. Patient is now s/p bilateral BKA. Disposition plan is likely for SNF. No identifiable [facility] Care Management needs at this time.    Marthenia Rolling, MSN-Ed, RN,BSN [facility] Liaison [phone]

## 2018-04-11 NOTE — Progress Notes (Signed)
Pt adm to PACU on Phenylephrine drip at 10 mcg/min. Weaning as BP tolerates.

## 2018-04-11 NOTE — Anesthesia Procedure Notes (Signed)
Procedure Name: LMA Insertion Date/Time: 04/11/2018 9:40 AM Performed by: Leonor Liv, CRNA Pre-anesthesia Checklist: Patient identified, Emergency Drugs available, Suction available and Patient being monitored Patient Re-evaluated:Patient Re-evaluated prior to induction Oxygen Delivery Method: Circle System Utilized Preoxygenation: Pre-oxygenation with 100% oxygen Induction Type: IV induction Ventilation: Mask ventilation without difficulty LMA: LMA inserted LMA Size: 5.0 Number of attempts: 1 Placement Confirmation: positive ETCO2 Tube secured with: Tape Dental Injury: Teeth and Oropharynx as per pre-operative assessment

## 2018-04-11 NOTE — Anesthesia Preprocedure Evaluation (Signed)
Anesthesia Evaluation  Patient identified by MRN, date of birth, ID band Patient awake    Reviewed: Allergy & Precautions, NPO status , Patient's Chart, lab work & pertinent test results  Airway Mallampati: II  TM Distance: >3 FB Neck ROM: Full    Dental no notable dental hx.    Pulmonary sleep apnea and Continuous Positive Airway Pressure Ventilation , former smoker,    Pulmonary exam normal breath sounds clear to auscultation       Cardiovascular hypertension, Normal cardiovascular exam+ dysrhythmias Atrial Fibrillation  Rhythm:Regular Rate:Normal     Neuro/Psych negative neurological ROS  negative psych ROS   GI/Hepatic negative GI ROS, Neg liver ROS,   Endo/Other  negative endocrine ROSdiabetes  Renal/GU negative Renal ROS  negative genitourinary   Musculoskeletal negative musculoskeletal ROS (+)   Abdominal   Peds negative pediatric ROS (+)  Hematology negative hematology ROS (+)   Anesthesia Other Findings   Reproductive/Obstetrics negative OB ROS                             Anesthesia Physical Anesthesia Plan  ASA: III  Anesthesia Plan: General   Post-op Pain Management:    Induction: Intravenous  PONV Risk Score and Plan: 2 and Ondansetron, Dexamethasone and Treatment may vary due to age or medical condition  Airway Management Planned: LMA  Additional Equipment:   Intra-op Plan:   Post-operative Plan: Extubation in OR  Informed Consent: I have reviewed the patients History and Physical, chart, labs and discussed the procedure including the risks, benefits and alternatives for the proposed anesthesia with the patient or authorized representative who has indicated his/her understanding and acceptance.     Dental advisory given  Plan Discussed with: CRNA and Surgeon  Anesthesia Plan Comments:         Anesthesia Quick Evaluation

## 2018-04-11 NOTE — Anesthesia Postprocedure Evaluation (Signed)
Anesthesia Post Note  Patient: Albert [name]  Procedure(s) Performed: RIGHT BELOW KNEE AMPUTATION (Right )     Patient location during evaluation: PACU Anesthesia Type: General Level of consciousness: awake and alert Pain management: pain level controlled Vital Signs Assessment: post-procedure vital signs reviewed and stable Respiratory status: spontaneous breathing, nonlabored ventilation, respiratory function stable and patient connected to nasal cannula oxygen Cardiovascular status: blood pressure returned to baseline and stable Postop Assessment: no apparent nausea or vomiting Anesthetic complications: no    Last Vitals:  Vitals:   04/11/18 1155 04/11/18 1200  BP: 127/73 122/69  Pulse: 85 86  Resp: 18 17  Temp:    SpO2: 90% 90%    Last Pain:  Vitals:   04/11/18 1200  TempSrc:   PainSc: Asleep                 Anavey Coombes S

## 2018-04-11 NOTE — Progress Notes (Signed)
[facility]    NEPHROLOGY PROGRESS NOTE  SUBJECTIVE:    S/p R BKA this AM  OBJECTIVE:  Vitals:   04/11/18 1200 04/11/18 1500  BP: 122/69 132/80  Pulse: 86 82  Resp: 17 18  Temp:    SpO2: 90% 93%    Intake/Output Summary (Last 24 hours) at 04/11/2018 1551 Last data filed at 04/11/2018 1300 Gross per 24 hour  Intake 803.51 ml  Output 25 ml  Net 778.51 ml      [place]l:  AAOx3 NAD HEENT: MMM [place] AT anicteric sclera CV:  Reg, HR 90s; A flutter on monitor Lungs:  L/S CTA bilaterally Abd:  abd SNT/ND with normal BS Extremities:  No LE edema. S/p L BKA.  Left upper extremity AV fistula with a good thrill and bruit. Skin:  No skin rash Neuro; Conversant  MEDICATIONS:  . acetaminophen      . allopurinol  100 mg Oral Daily  . [START ON 04/14/2018] amiodarone  200 mg Oral Daily  . amiodarone  400 mg Oral BID  . aspirin EC  81 mg Oral Daily  . Chlorhexidine Gluconate Cloth  6 each Topical Q0600  . Chlorhexidine Gluconate Cloth  6 each Topical Q0600  . cinacalcet  30 mg Oral Q supper  . darbepoetin (ARANESP) injection - DIALYSIS  100 mcg Intravenous Q Sat-HD  . diltiazem  180 mg Oral Daily  . doxercalciferol  3.5 mcg Intravenous Q T,Th,Sa-HD  . insulin aspart  0-9 Units Subcutaneous TID WC  . metoprolol tartrate  50 mg Oral BID  . midodrine  10 mg Oral TID WC  . pantoprazole  40 mg Oral Daily  . pravastatin  5 mg Oral QPM  . senna-docusate  1 tablet Oral BID  . sevelamer carbonate  1,600 mg Oral With snacks  . sevelamer carbonate  3,200 mg Oral TID WC  . sodium chloride flush  3 mL Intravenous Q12H    LABS:   CBC Latest Ref Rng & Units 04/11/2018 04/11/2018 04/10/2018  WBC 4.0 - 10.5 K/uL - 15.3(H) 15.8(H)  Hemoglobin 13.0 - 17.0 g/dL 10.2(L) 9.3(L) 9.5(L)  Hematocrit 39.0 - 52.0 % 30.0(L) 30.1(L) 31.5(L)  Platelets 150 - 400 K/uL - 233 224    CMP Latest Ref Rng & Units 04/11/2018 04/10/2018 04/09/2018  Glucose 70 - 99 mg/dL 136(H) 159(H) 208(H)  BUN 8 -  23 mg/dL - 38(H) 23  Creatinine 0.61 - 1.24 mg/dL - 6.72(H) 5.00(H)  Sodium 135 - 145 mmol/L 132(L) 136 136  Potassium 3.5 - 5.1 mmol/L 5.1 4.6 4.2  Chloride 98 - 111 mmol/L - 94(L) 96(L)  CO2 22 - 32 mmol/L - 24 24  Calcium 8.9 - 10.3 mg/dL - 8.4(L) 8.3(L)  Total Protein 6.5 - 8.1 g/dL - - -  Total Bilirubin 0.3 - 1.2 mg/dL - - -  Alkaline Phos 38 - 126 U/L - - -  AST 15 - 41 U/L - - -  ALT 17 - 63 U/L - - -    Lab Results  Component Value Date   PTH 350.0 (H) 03/21/2007   CALCIUM 8.4 (L) 04/10/2018   CAION 0.95 (L) 01/18/2017   PHOS 4.5 04/07/2018       Component Value Date/Time   COLORURINE YELLOW 10/12/2013 [place] 10/12/2013 1045   LABSPEC 1.015 10/12/2013 1045   PHURINE 8.0 10/12/2013 1045   GLUCOSEU NEGATIVE 10/12/2013 1045   HGBUR MODERATE (A) 10/12/2013 [place] 10/12/2013 1045  KETONESUR NEGATIVE 10/12/2013 1045   PROTEINUR 30 (A) 10/12/2013 1045   UROBILINOGEN 1.0 10/12/2013 1045   NITRITE NEGATIVE 10/12/2013 1045   LEUKOCYTESUR NEGATIVE 10/12/2013 1045      Component Value Date/Time   PHART 7.405 10/12/2013 0352   PCO2ART 32.2 (L) 10/12/2013 0352   PO2ART 111.0 (H) 10/12/2013 0352   HCO3 20.4 10/12/2013 0352   TCO2 28 01/18/2017 1346   ACIDBASEDEF 3.9 (H) 10/12/2013 0352   O2SAT 99.1 10/12/2013 0352       Component Value Date/Time   IRON 46 03/16/2007 0610   TIBC 358 03/16/2007 0610   FERRITIN 237 03/16/2007 0610   IRONPCTSAT 13 (L) 03/16/2007 0610    Hemodialysis prescription: TTS, 4 hours 15 minutes, [facility].  Dry weight 97 kg.  Left brachiocephalic fistula.   ASSESSMENT/PLAN:     1. Bilateral arterial insufficiency- with cellulitis and gangrenous changes.  Per VVS and primary service., S/p angio, s/p TMTs on both sides  2/5, s/p LBKA 2/15. S/p R BKA 2/18 2. ESRD continue with HD qTTS Dr.  Lowanda Foster, on HD.  Below EDW losing weight.  Poor po intake.  Will need new EDW on D/C. Cont on schedule:  4h60min, AVF, no heparin. 2K.   3. Anemia: cont with ESA.  Hemoglobin stable in 9s, cont ESA. 4. CKD-MBD: continue with binders and vit D 5. Nutrition: renal diet 6. Hypertension: not a current issue, see below for a fib. 7. DM- stable 8. Afib: on cardizem and metoprolol, Coumadin per pharmacy. Cardiology involved now given tachycardia - midodrine added by cardiology to allow titration of blocking agents.   Pearson Grippe MD

## 2018-04-11 NOTE — NC FL2 (Signed)
[place] LEVEL OF CARE SCREENING TOOL     IDENTIFICATION  Patient Name: Albert [name] Birthdate: [birth year] Sex: male Admission Date (Current Location): 03/20/2018  [facility] and [place] Number:  Herbalist and Address:  [facility], [place] [address] [place], [place] [address]      Provider Number: [id number]  Attending Physician Name and Address:  Antonieta Pert, MD  Relative Name and Phone Number:       Current Level of Care: Hospital Recommended Level of Care:   Prior Approval Number:    Date Approved/Denied:   PASRR Number: [id number]  Discharge Plan: SNF    Current Diagnoses: Patient Active Problem List   Diagnosis Date Noted  . Hemodialysis-associated hypotension   . Pressure injury of skin 04/04/2018  . ESRD on dialysis ([place])   . Necrosis of toe ([place])   . PVD (peripheral vascular disease) ([place])   . Cellulitis of left lower extremity 03/20/2018  . Pancreas cyst 05/13/2017  . Abnormal CT scan, stomach   . Cholelithiasis 04/05/2017  . Biliary dyskinesia 04/05/2017  . Abdominal pain 03/11/2017  . Abdominal swelling 03/11/2017  . Hyperglycemia 01/21/2017  . Gastritis and gastroduodenitis   . Abnormal findings on esophagogastroduodenoscopy (EGD)   . GI bleed 01/05/2017  . Heme positive stool   . Gastrointestinal hemorrhage   . Supratherapeutic INR   . Coagulopathy ([place]) 01/03/2017  . Anemia 01/03/2017  . Constipation 12/17/2015  . Rectal bleeding 12/17/2015  . History of colonic polyps 12/17/2015  . Visit for suture removal 12/06/2013  . Bradycardia 10/12/2013  . Metabolic acidosis 31/51/7616  . Acute respiratory failure with hypercapnia ([place]) 10/12/2013  . Encounter for therapeutic drug monitoring 03/29/2013  . Atrial fibrillation ([place]) 03/10/2011  . Long term (current) use of anticoagulants 03/10/2011  . Secondary cardiomyopathy, unspecified 03/10/2011  . ESRD (end stage renal disease) ([place])  03/10/2011  . HYPERLIPIDEMIA 08/24/2007  . PRIMARY CENTRAL SLEEP APNEA 08/24/2007  . OBSTRUCTIVE SLEEP APNEA 08/24/2007  . Essential hypertension, benign 08/24/2007    Orientation RESPIRATION BLADDER Height & Weight     Self, Time, Situation, Place  Normal Continent Weight: 205 lb 4 oz (93.1 kg) Height:  6\' 2"  (188 cm)  BEHAVIORAL SYMPTOMS/MOOD NEUROLOGICAL BOWEL NUTRITION STATUS      Continent Diet(please see discharge summary)  AMBULATORY STATUS COMMUNICATION OF NEEDS Skin     Verbally Surgical wounds(pressure injury ankle, left, pressure injury Stage II Buttocks, left;mid, incision(closed) toe, left , incision(closed) toe, right, incision(closed) leg; right, incision(closed) arm; left)                       Personal Care Assistance Level of Assistance  Bathing, Feeding, Dressing Bathing Assistance: Limited assistance Feeding assistance: Independent Dressing Assistance: Limited assistance     Functional Limitations Info  Sight, Hearing, Speech Sight Info: Adequate Hearing Info: Adequate Speech Info: Adequate    SPECIAL CARE FACTORS FREQUENCY  PT (By licensed PT), OT (By licensed OT)     PT Frequency: 3x per week  OT Frequency: 3 x per week             Contractures Contractures Info: Not present    Additional Factors Info  Code Status, Allergies, Insulin Sliding Scale(dialysis patient T, TH, SAT) Code Status Info: FULL Allergies Info: Metformin And Related   Insulin Sliding Scale Info: insulin aspart (novoLOG) injection 0-9 Units 3x w/meals        Current Medications (04/11/2018):  This is the current hospital active medication list Current Facility-Administered Medications  Medication Dose Route Frequency Provider Last Rate Last Dose  . 0.9 %  sodium chloride infusion  250 mL Intravenous PRN Waynetta Sandy, MD      . 0.9 %  sodium chloride infusion  100 mL Intravenous PRN Waynetta Sandy, MD      . 0.9 %  sodium chloride infusion   100 mL Intravenous PRN Waynetta Sandy, MD      . 0.9 %  sodium chloride infusion   Intravenous Continuous Waynetta Sandy, MD   Stopped at 04/09/18 1426  . acetaminophen (TYLENOL) 500 MG tablet           . acetaminophen (TYLENOL) tablet 650 mg  650 mg Oral Q4H PRN Waynetta Sandy, MD   650 mg at 04/03/18 0850  . allopurinol (ZYLOPRIM) tablet 100 mg  100 mg Oral Daily Waynetta Sandy, MD   100 mg at 04/11/18 1251  . alteplase (CATHFLO ACTIVASE) injection 2 mg  2 mg Intracatheter Once PRN Waynetta Sandy, MD      . Derrill Memo ON 04/14/2018] amiodarone (PACERONE) tablet 200 mg  200 mg Oral Daily Waynetta Sandy, MD      . amiodarone (PACERONE) tablet 400 mg  400 mg Oral BID Waynetta Sandy, MD   Stopped at 04/11/18 1000  . aspirin EC tablet 81 mg  81 mg Oral Daily Waynetta Sandy, MD   81 mg at 04/11/18 1250  . calcium carbonate (TUMS - dosed in mg elemental calcium) chewable tablet 200 mg of elemental calcium  1 tablet Oral TID PRN Antonieta Pert, MD      . Chlorhexidine Gluconate Cloth 2 % PADS 6 each  6 each Topical Q0600 Waynetta Sandy, MD   6 each at 04/11/18 0544  . Chlorhexidine Gluconate Cloth 2 % PADS 6 each  6 each Topical Q0600 Waynetta Sandy, MD      . cinacalcet [facility]) tablet 30 mg  30 mg Oral Q supper Waynetta Sandy, MD   30 mg at 04/10/18 1722  . Darbepoetin Alfa (ARANESP) injection 100 mcg  100 mcg Intravenous Q Sat-HD Waynetta Sandy, MD   100 mcg at 04/08/18 2029  . diltiazem (CARDIZEM CD) 24 hr capsule 180 mg  180 mg Oral Daily Waynetta Sandy, MD   180 mg at 04/11/18 0800  . doxercalciferol (HECTOROL) injection 3.5 mcg  3.5 mcg Intravenous Q T,Th,Sa-HD Waynetta Sandy, MD   3.5 mcg at 04/08/18 2029  . heparin ADULT infusion 100 units/mL (25000 units/233mL sodium chloride 0.45%)  1,400 Units/hr Intravenous Continuous Waynetta Sandy, MD       . heparin injection 1,000 Units  1,000 Units Dialysis PRN Waynetta Sandy, MD      . heparin injection 1,000 Units  1,000 Units Dialysis PRN Waynetta Sandy, MD      . hydrALAZINE (APRESOLINE) injection 5 mg  5 mg Intravenous Q20 Min PRN Waynetta Sandy, MD      . insulin aspart (novoLOG) injection 0-9 Units  0-9 Units Subcutaneous TID WC Waynetta Sandy, MD   1 Units at 04/11/18 1251  . labetalol (NORMODYNE,TRANDATE) injection 10 mg  10 mg Intravenous Q10 min PRN Waynetta Sandy, MD      . lidocaine (PF) (XYLOCAINE) 1 % injection 5 mL  5 mL Intradermal PRN Waynetta Sandy, MD      . lidocaine-prilocaine (EMLA) cream  1 application  1 application Topical PRN Waynetta Sandy, MD      . metoprolol tartrate (LOPRESSOR) tablet 50 mg  50 mg Oral BID Waynetta Sandy, MD   50 mg at 04/11/18 0848  . midodrine (PROAMATINE) tablet 10 mg  10 mg Oral TID WC Waynetta Sandy, MD   10 mg at 04/11/18 1250  . morphine 2 MG/ML injection 2 mg  2 mg Intravenous Q2H PRN Waynetta Sandy, MD      . ondansetron [facility]) injection 4 mg  4 mg Intravenous Q6H PRN Waynetta Sandy, MD      . oxyCODONE (Oxy IR/ROXICODONE) immediate release tablet 5-10 mg  5-10 mg Oral Q4H PRN Waynetta Sandy, MD   10 mg at 04/10/18 2118  . pantoprazole (PROTONIX) EC tablet 40 mg  40 mg Oral Daily Waynetta Sandy, MD   40 mg at 04/11/18 0801  . pentafluoroprop-tetrafluoroeth (GEBAUERS) aerosol 1 application  1 application Topical PRN Waynetta Sandy, MD      . phenol [facility]) mouth spray 1 spray  1 spray Mouth/Throat PRN Waynetta Sandy, MD      . pravastatin (PRAVACHOL) tablet 5 mg  5 mg Oral QPM Waynetta Sandy, MD   5 mg at 04/09/18 1653  . senna-docusate (Senokot-S) tablet 1 tablet  1 tablet Oral BID Waynetta Sandy, MD   1 tablet at 04/11/18 1250  . sevelamer  carbonate (RENVELA) tablet 1,600 mg  1,600 mg Oral With snacks Waynetta Sandy, MD   1,600 mg at 04/10/18 1724  . sevelamer carbonate (RENVELA) tablet 3,200 mg  3,200 mg Oral TID WC Waynetta Sandy, MD   3,200 mg at 04/11/18 1322  . sodium chloride flush (NS) 0.9 % injection 3 mL  3 mL Intravenous Q12H Waynetta Sandy, MD   3 mL at 04/10/18 1111  . sodium chloride flush (NS) 0.9 % injection 3 mL  3 mL Intravenous PRN Waynetta Sandy, MD         Discharge Medications: Please see discharge summary for a list of discharge medications.  Relevant Imaging Results:  Relevant Lab Results:   Additional Information SSN [place] [id number] [place], [place]

## 2018-04-11 NOTE — Progress Notes (Signed)
Phenylephrine drip off. BP 124/69.

## 2018-04-12 ENCOUNTER — Encounter (HOSPITAL_COMMUNITY): Payer: Self-pay | Admitting: Vascular Surgery

## 2018-04-12 LAB — PROTIME-INR
INR: 1.91
Prothrombin Time: 21.6 seconds — ABNORMAL HIGH (ref 11.4–15.2)

## 2018-04-12 LAB — RENAL FUNCTION PANEL
Albumin: 1.6 g/dL — ABNORMAL LOW (ref 3.5–5.0)
Anion gap: 18 — ABNORMAL HIGH (ref 5–15)
BUN: 64 mg/dL — AB (ref 8–23)
CO2: 21 mmol/L — ABNORMAL LOW (ref 22–32)
Calcium: 8.1 mg/dL — ABNORMAL LOW (ref 8.9–10.3)
Chloride: 95 mmol/L — ABNORMAL LOW (ref 98–111)
Creatinine, Ser: 9.72 mg/dL — ABNORMAL HIGH (ref 0.61–1.24)
GFR calc Af Amer: 6 mL/min — ABNORMAL LOW (ref >60)
GFR calc non Af Amer: 5 mL/min — ABNORMAL LOW (ref >60)
Glucose, Bld: 174 mg/dL — ABNORMAL HIGH (ref 70–99)
Phosphorus: 6.2 mg/dL — ABNORMAL HIGH (ref 2.5–4.6)
Potassium: 5.6 mmol/L — ABNORMAL HIGH (ref 3.5–5.1)
SODIUM: 134 mmol/L — AB (ref 135–145)

## 2018-04-12 LAB — GLUCOSE, CAPILLARY
Glucose-Capillary: 199 mg/dL — ABNORMAL HIGH (ref 70–99)
Glucose-Capillary: 161 mg/dL — ABNORMAL HIGH (ref 70–99)
Glucose-Capillary: 127 mg/dL — ABNORMAL HIGH (ref 70–99)
Glucose-Capillary: 166 mg/dL — ABNORMAL HIGH (ref 70–99)
Glucose-Capillary: 162 mg/dL — ABNORMAL HIGH (ref 70–99)

## 2018-04-12 LAB — HEPARIN LEVEL (UNFRACTIONATED)
Heparin Unfractionated: 0.15 IU/mL — ABNORMAL LOW (ref 0.30–0.70)
Heparin Unfractionated: 0.32 IU/mL (ref 0.30–0.70)

## 2018-04-12 LAB — CBC
HCT: 26.7 % — ABNORMAL LOW (ref 39.0–52.0)
HEMOGLOBIN: 8.3 g/dL — AB (ref 13.0–17.0)
MCH: 27.3 pg (ref 26.0–34.0)
MCHC: 31.1 g/dL (ref 30.0–36.0)
MCV: 87.8 fL (ref 80.0–100.0)
Platelets: 207 10*3/uL (ref 150–400)
RBC: 3.04 MIL/uL — ABNORMAL LOW (ref 4.22–5.81)
RDW: 15.6 % — ABNORMAL HIGH (ref 11.5–15.5)
WBC: 19.7 10*3/uL — ABNORMAL HIGH (ref 4.0–10.5)
nRBC: 0.3 % — ABNORMAL HIGH (ref 0.0–0.2)

## 2018-04-12 MED ORDER — DOXERCALCIFEROL 4 MCG/2ML IV SOLN
INTRAVENOUS | Status: AC
  Administered 2018-04-12: 3.5 ug via INTRAVENOUS
  Filled 2018-04-12: qty 2

## 2018-04-12 MED ORDER — OXYCODONE HCL 5 MG PO TABS
ORAL_TABLET | ORAL | Status: AC
  Administered 2018-04-12: 10 mg via ORAL
  Filled 2018-04-12: qty 2

## 2018-04-12 MED ORDER — CYCLOBENZAPRINE HCL 10 MG PO TABS
5.0000 mg | ORAL_TABLET | Freq: Three times a day (TID) | ORAL | Status: DC | PRN
  Administered 2018-04-12 – 2018-04-14 (×2): 5 mg via ORAL
  Filled 2018-04-12 (×3): qty 1

## 2018-04-12 MED ORDER — ALUM & MAG HYDROXIDE-SIMETH 200-200-20 MG/5ML PO SUSP
30.0000 mL | Freq: Four times a day (QID) | ORAL | Status: DC | PRN
  Administered 2018-04-12 – 2018-04-14 (×2): 30 mL via ORAL
  Filled 2018-04-12 (×2): qty 30

## 2018-04-12 NOTE — Care Management Important Message (Signed)
Important Message  Patient Details  Name: Albert [name] MRN: [id number] Date of Birth: 06-11-1955   Medicare Important Message Given:  Yes    Barb Merino [place] 04/12/2018, 12:23 PM

## 2018-04-12 NOTE — Progress Notes (Signed)
Progress Note  Patient Name: Albert [name] Date of Encounter: 04/12/2018  Primary Cardiologist: Rozann Lesches, MD   Subjective   No complaints. No chest pain or dyspnea.  Inpatient Medications    Scheduled Meds: . allopurinol  100 mg Oral Daily  . [START ON 04/14/2018] amiodarone  200 mg Oral Daily  . amiodarone  400 mg Oral BID  . aspirin EC  81 mg Oral Daily  . Chlorhexidine Gluconate Cloth  6 each Topical Q0600  . Chlorhexidine Gluconate Cloth  6 each Topical Q0600  . cinacalcet  30 mg Oral Q supper  . darbepoetin (ARANESP) injection - DIALYSIS  100 mcg Intravenous Q Sat-HD  . diltiazem  180 mg Oral Daily  . doxercalciferol  3.5 mcg Intravenous Q T,Th,Sa-HD  . insulin aspart  0-9 Units Subcutaneous TID WC  . metoprolol tartrate  50 mg Oral BID  . midodrine  10 mg Oral TID WC  . pantoprazole  40 mg Oral Daily  . pravastatin  5 mg Oral QPM  . senna-docusate  1 tablet Oral BID  . sevelamer carbonate  1,600 mg Oral With snacks  . sevelamer carbonate  3,200 mg Oral TID WC  . sodium chloride flush  3 mL Intravenous Q12H   Continuous Infusions: . sodium chloride    . sodium chloride    . sodium chloride    . sodium chloride Stopped (04/09/18 1426)  . heparin 1,500 Units/hr (04/12/18 0828)   PRN Meds: sodium chloride, sodium chloride, sodium chloride, acetaminophen, alteplase, alum & mag hydroxide-simeth, bisacodyl, calcium carbonate, cyclobenzaprine, heparin, heparin, hydrALAZINE, labetalol, lidocaine (PF), lidocaine-prilocaine, morphine injection, ondansetron (ZOFRAN) IV, oxyCODONE, pentafluoroprop-tetrafluoroeth, phenol, polyethylene glycol, sodium chloride flush   Vital Signs    Vitals:   04/12/18 0335 04/12/18 0422 04/12/18 0438 04/12/18 0729  BP: 129/62 (!) 149/64 110/71 112/76  Pulse: 75 87 (!) 106   Resp: (!) 34 (!) 22 20 20   Temp:  97.7 F (36.5 C) 97.8 F (36.6 C) (!) 97.5 F (36.4 C)  TempSrc:  Oral Axillary Oral  SpO2: 97% 97% 95% 99%  Weight:   89 kg    Height:        Intake/Output Summary (Last 24 hours) at 04/12/2018 0926 Last data filed at 04/12/2018 0910 Gross per 24 hour  Intake 682.38 ml  Output 2068 ml  Net -1385.62 ml   Last 3 Weights 04/12/2018 04/11/2018 04/11/2018  Weight (lbs) 196 lb 3.4 oz 202 lb 13.2 oz 205 lb 4 oz  Weight (kg) 89 kg 92 kg 93.1 kg      Telemetry   Atrial fib, rate 90s - Personally Reviewed  ECG    n/a - Personally Reviewed  Physical Exam   VS:  BP 112/76 (BP Location: Right Arm)   Pulse (!) 106   Temp (!) 97.5 F (36.4 C) (Oral)   Resp 20   Ht 6\' 2"  (1.88 m)   Wt 89 kg   SpO2 99%   BMI 25.19 kg/m  , BMI Body mass index is 25.19 kg/m.   General: Well developed, well nourished, NAD  HEENT: OP clear, mucus membranes moist  SKIN: warm, dry. No rashes. Neuro: No focal deficits  Musculoskeletal: Muscle strength 5/5 all ext  Psychiatric: flat affect.  Neck: No JVD, no carotid bruits, no thyromegaly, no lymphadenopathy.  Lungs:Clear bilaterally, no wheezes, rhonci, crackles Cardiovascular: Irreg irreg. No murmurs, gallops or rubs. Abdomen:Soft. Bowel sounds present. Non-tender.  Extremities: Left AKA, right BKA     Labs  Chemistry Recent Labs  Lab 04/07/18 2316 04/09/18 0406 04/10/18 0344 04/11/18 0847 04/12/18 0043  NA 133* 136 136 132* 134*  K 4.3 4.2 4.6 5.1 5.6*  CL 93* 96* 94*  --  95*  CO2 24 24 24   --  21*  GLUCOSE 185* 208* 159* 136* 174*  BUN 43* 23 38*  --  64*  CREATININE 7.98* 5.00* 6.72*  --  9.72*  CALCIUM 8.5* 8.3* 8.4*  --  8.1*  ALBUMIN 1.7*  --   --   --  1.6*  GFRNONAA 6* 11* 8*  --  5*  GFRAA 7* 13* 9*  --  6*  ANIONGAP 16* 16* 18*  --  18*     Hematology Recent Labs  Lab 04/10/18 0344 04/11/18 0240 04/11/18 0847 04/12/18 0235  WBC 15.8* 15.3*  --  19.7*  RBC 3.53* 3.43*  --  3.04*  HGB 9.5* 9.3* 10.2* 8.3*  HCT 31.5* 30.1* 30.0* 26.7*  MCV 89.2 87.8  --  87.8  MCH 26.9 27.1  --  27.3  MCHC 30.2 30.9  --  31.1  RDW 15.3 15.3   --  15.6*  PLT 224 233  --  207    Cardiac EnzymesNo results for input(s): TROPONINI in the last 168 hours. No results for input(s): TROPIPOC in the last 168 hours.   BNPNo results for input(s): BNP, PROBNP in the last 168 hours.   DDimer No results for input(s): DDIMER in the last 168 hours.   Radiology    No results found.  Cardiac Studies   Echocardiogram 11/2016: Study Conclusions: - Left ventricle: The cavity size was normal. Wall thickness was increased in a pattern of mild LVH. Systolic function was normal. The estimated ejection fraction was in the range of 50% to 55%. Wall motion was normal; there were no regional wall motion abnormalities. The study was not technically sufficient to allow evaluation of LV diastolic dysfunction due to atrial fibrillation. Indeterminate filling pressures. - Aorta: Mild aortic root dilatation. Diameter 4 cm. - Mitral valve: Calcified annulus. Normal thickness leaflets . - Left atrium: The atrium was moderately dilated. - Right ventricle: The cavity size was mildly dilated. Systolic function was mildly reduced. - Right atrium: The atrium was mildly dilated. - Tricuspid valve: There was mild regurgitation.  Patient Profile      Albert [name] is a 63 yo male with history of persistent atrial fibrillation, hypertension, hyperlipidemia, diabetes, ESRD on HD and PAD with chronic limb ischemia who has undergone left AKA and right BKA during this admission.   Assessment & Plan    1. Persistent atrial fibrillation: Rate controlled atrial fib today. Continue amiodarone, diltiazem and metoprolol. Amiodarone will be reduced to once daily dosing on 04/14/18. Restart coumadin when ok with vascular surgery team.      2. Hypotension: Continue midodrine.   We will follow from a distance this week. At time of discharge, will need to have outpatient cardiology arranged. Please call us with questions.      For questions or updates,  please contact [place] Please consult [email or web address] for contact info under        Signed, Lauree Chandler, MD  04/12/2018, 9:26 AM

## 2018-04-12 NOTE — Progress Notes (Signed)
Pt refused all night time medications. Offered something to eat and drink, patient refused. Will continue monitor.    Fransico Michael, RN

## 2018-04-12 NOTE — Evaluation (Addendum)
Physical Therapy Re-Evaluation Patient Details Name: Albert [name] MRN: [id number] DOB: [birth year] Today's Date: 04/12/2018   History of Present Illness  Pt is a 63 y.o. M with significant PMH of ESRD, PVD, who initially presented s/p right transmetarsal amputation. Now s/p L BKA 2/15 and right BKA 2/18.  Clinical Impression  Pt re-evaluated s/p right BKA 2/18. Pt slightly more confused this session and in considerable pain with initiation of movement. Pt requiring two person maximal assistance for bed mobility. Able to progress to sitting edge of bed x 10 minutes with supervision. Transfer to chair deferred this session. Will trial anterior to posterior versus lateral scoot transfer next session.  Goals remain appropriate.    Follow Up Recommendations SNF    Equipment Recommendations  Other (comment)(defer)    Recommendations for Other Services       Precautions / Restrictions Precautions Precautions: Fall Precaution Comments: L BKA, R BKA Restrictions Weight Bearing Restrictions: Yes RLE Weight Bearing: Non weight bearing LLE Weight Bearing: Non weight bearing      Mobility  Bed Mobility Overal bed mobility: Needs Assistance Bed Mobility: Supine to Sit;Sit to Supine     Supine to sit: Max assist;+2 for physical assistance Sit to supine: Max assist;+2 for physical assistance   General bed mobility comments: max cues for reaching contralateral upper extremity over for bed rail and use of bed pad for scooting hips toward edge of bed. increased assistance required due to pt actively resisting secondary to pain  Transfers                 General transfer comment: Deferred  Ambulation/Gait                Stairs            Wheelchair Mobility    Modified Rankin (Stroke Patients Only)       Balance Overall balance assessment: Needs assistance Sitting-balance support: Bilateral upper extremity supported Sitting balance-Leahy Scale:  Fair Sitting balance - Comments: Able to progress to supervision with hands resting in lap versus bilateral UE supported. has difficulty relaxing residual limb due to guarding                                     Pertinent Vitals/Pain Pain Assessment: Faces Pain Location: right residual limb Pain Descriptors / Indicators: Grimacing;Operative site guarding Pain Intervention(s): Monitored during session;Limited activity within patient's tolerance    Home Living Family/patient expects to be discharged to:: Private residence Living Arrangements: Alone Available Help at Discharge: Family;Available PRN/intermittently Type of Home: Apartment Home Access: Ramped entrance     Home Layout: One level Home Equipment: Walker - 2 wheels;Shower seat;Wheelchair - manual      Prior Function Level of Independence: Independent with assistive device(s)         Comments: "furniture walks," in apartment, uses wheelchair for community mobility, independent with ADLs/selfcare     Hand Dominance   Dominant Hand: Right    Extremity/Trunk Assessment   Upper Extremity Assessment Upper Extremity Assessment: Defer to OT evaluation    Lower Extremity Assessment Lower Extremity Assessment: RLE deficits/detail;LLE deficits/detail RLE Deficits / Details: s/p R BKA. At least anti graviy strength LLE Deficits / Details: S/p L BKA with gross weakness, unable to perform SLR, weak quad set    Cervical / Trunk Assessment Cervical / Trunk Assessment: Normal  Communication   Communication: No difficulties  Cognition  Arousal/Alertness: Awake/alert Behavior During Therapy: WFL for tasks assessed/performed Overall Cognitive Status: No family/caregiver present to determine baseline cognitive functioning                                 General Comments: pt more confused this session, stating, "how long have I been laying back?"      General Comments      Exercises      Assessment/Plan    PT Assessment Patient needs continued PT services  PT Problem List Decreased strength;Decreased range of motion;Decreased activity tolerance;Decreased balance;Decreased mobility;Pain       PT Treatment Interventions DME instruction;Functional mobility training;Therapeutic activities;Therapeutic exercise;Balance training;Patient/family education;Wheelchair mobility training    PT Goals (Current goals can be found in the Care Plan section)  Acute Rehab PT Goals Patient Stated Goal: decrease pain PT Goal Formulation: With patient Time For Goal Achievement: 04/23/18 Potential to Achieve Goals: Good    Frequency Min 2X/week   Barriers to discharge        Co-evaluation PT/OT/SLP Co-Evaluation/Treatment: Yes Reason for Co-Treatment: For patient/therapist safety;To address functional/ADL transfers PT goals addressed during session: Mobility/safety with mobility         AM-PAC PT "6 Clicks" Mobility  Outcome Measure Help needed turning from your back to your side while in a flat bed without using bedrails?: A Lot Help needed moving from lying on your back to sitting on the side of a flat bed without using bedrails?: A Lot Help needed moving to and from a bed to a chair (including a wheelchair)?: A Lot Help needed standing up from a chair using your arms (e.g., wheelchair or bedside chair)?: Total Help needed to walk in hospital room?: Total Help needed climbing 3-5 steps with a railing? : Total 6 Click Score: 9    End of Session Equipment Utilized During Treatment: Gait belt Activity Tolerance: Patient limited by pain Patient left: with call bell/phone within reach;in bed Nurse Communication: Mobility status PT Visit Diagnosis: Other abnormalities of gait and mobility (R26.89);Muscle weakness (generalized) (M62.81);Pain Pain - Right/Left: Right Pain - part of body: Ankle and joints of foot    Time: 0831-0900 PT Time Calculation (min) (ACUTE ONLY): 29  min   Charges:   PT Evaluation $PT Re-evaluation: 1 Re-eval         Ellamae Sia, PT, DPT Acute Rehabilitation Services Pager [phone] Office [phone]  Willy Eddy 04/12/2018, 9:19 AM

## 2018-04-12 NOTE — Progress Notes (Signed)
[facility]  NEPHROLOGY PROGRESS NOTE  SUBJECTIVE:    HD overnight, 2L ultrafiltration, post weight 89 kg  No complaints this morning  OBJECTIVE:  Vitals:   04/12/18 0438 04/12/18 0729  BP: 110/71 112/76  Pulse: (!) 106   Resp: 20 20  Temp: 97.8 F (36.6 C) (!) 97.5 F (36.4 C)  SpO2: 95% 99%    Intake/Output Summary (Last 24 hours) at 04/12/2018 1025 Last data filed at 04/12/2018 0910 Gross per 24 hour  Intake 682.38 ml  Output 2043 ml  Net -1360.62 ml      Genearl:  AAOx3 NAD HEENT: MMM [place] AT anicteric sclera CV:  Reg, HR 90s; A flutter on monitor Lungs:  L/S CTA bilaterally Abd:  abd SNT/ND with normal BS Extremities:  No LE edema. S/p L BKA + R BKA.  Bandaged.  Left upper extremity AV fistula with a thrill and bruit. Skin:  No skin rash Neuro; Conversant  MEDICATIONS:  . allopurinol  100 mg Oral Daily  . [START ON 04/14/2018] amiodarone  200 mg Oral Daily  . amiodarone  400 mg Oral BID  . aspirin EC  81 mg Oral Daily  . Chlorhexidine Gluconate Cloth  6 each Topical Q0600  . Chlorhexidine Gluconate Cloth  6 each Topical Q0600  . cinacalcet  30 mg Oral Q supper  . darbepoetin (ARANESP) injection - DIALYSIS  100 mcg Intravenous Q Sat-HD  . diltiazem  180 mg Oral Daily  . doxercalciferol  3.5 mcg Intravenous Q T,Th,Sa-HD  . insulin aspart  0-9 Units Subcutaneous TID WC  . metoprolol tartrate  50 mg Oral BID  . midodrine  10 mg Oral TID WC  . pantoprazole  40 mg Oral Daily  . pravastatin  5 mg Oral QPM  . senna-docusate  1 tablet Oral BID  . sevelamer carbonate  1,600 mg Oral With snacks  . sevelamer carbonate  3,200 mg Oral TID WC  . sodium chloride flush  3 mL Intravenous Q12H    LABS:   CBC Latest Ref Rng & Units 04/12/2018 04/11/2018 04/11/2018  WBC 4.0 - 10.5 K/uL 19.7(H) - 15.3(H)  Hemoglobin 13.0 - 17.0 g/dL 8.3(L) 10.2(L) 9.3(L)  Hematocrit 39.0 - 52.0 % 26.7(L) 30.0(L) 30.1(L)  Platelets 150 - 400 K/uL 207 - 233    CMP Latest Ref  Rng & Units 04/12/2018 04/11/2018 04/10/2018  Glucose 70 - 99 mg/dL 174(H) 136(H) 159(H)  BUN 8 - 23 mg/dL 64(H) - 38(H)  Creatinine 0.61 - 1.24 mg/dL 9.72(H) - 6.72(H)  Sodium 135 - 145 mmol/L 134(L) 132(L) 136  Potassium 3.5 - 5.1 mmol/L 5.6(H) 5.1 4.6  Chloride 98 - 111 mmol/L 95(L) - 94(L)  CO2 22 - 32 mmol/L 21(L) - 24  Calcium 8.9 - 10.3 mg/dL 8.1(L) - 8.4(L)  Total Protein 6.5 - 8.1 g/dL - - -  Total Bilirubin 0.3 - 1.2 mg/dL - - -  Alkaline Phos 38 - 126 U/L - - -  AST 15 - 41 U/L - - -  ALT 17 - 63 U/L - - -    Lab Results  Component Value Date   PTH 350.0 (H) 03/21/2007   CALCIUM 8.1 (L) 04/12/2018   CAION 0.95 (L) 01/18/2017   PHOS 6.2 (H) 04/12/2018       Component Value Date/Time   COLORURINE YELLOW 10/12/2013 [place] 10/12/2013 1045   LABSPEC 1.015 10/12/2013 1045   PHURINE 8.0 10/12/2013 [place] 10/12/2013 1045  HGBUR MODERATE (A) 10/12/2013 1045   BILIRUBINUR NEGATIVE 10/12/2013 1045   KETONESUR NEGATIVE 10/12/2013 1045   PROTEINUR 30 (A) 10/12/2013 1045   UROBILINOGEN 1.0 10/12/2013 1045   NITRITE NEGATIVE 10/12/2013 1045   LEUKOCYTESUR NEGATIVE 10/12/2013 1045      Component Value Date/Time   PHART 7.405 10/12/2013 0352   PCO2ART 32.2 (L) 10/12/2013 0352   PO2ART 111.0 (H) 10/12/2013 0352   HCO3 20.4 10/12/2013 0352   TCO2 28 01/18/2017 1346   ACIDBASEDEF 3.9 (H) 10/12/2013 0352   O2SAT 99.1 10/12/2013 0352       Component Value Date/Time   IRON 46 03/16/2007 0610   TIBC 358 03/16/2007 0610   FERRITIN 237 03/16/2007 0610   IRONPCTSAT 13 (L) 03/16/2007 0610    Hemodialysis prescription: TTS, 4 hours 15 minutes, [facility] [place].  Dry weight 97 kg.  Left brachiocephalic fistula.   ASSESSMENT/PLAN:     1. Bilateral arterial insufficiency- with cellulitis and gangrenous changes.  S/p angio, s/p TMAs on both sides  2/5, s/p LBKA 2/15. S/p R BKA 2/18 2. ESRD continue with HD qTTS Dr.  Lowanda Foster, on HD.  Below EDW  losing weight.  Poor po intake.  Will need new EDW on D/C. Cont on schedule: 4h, AVF, no heparin. 2K.   3. Anemia: On ESA.  CTM 4. CKD-MBD: continue with binders and vit D 5. Nutrition: renal diet 6. Hypertension: not a current issue, see below for a fib. 7. DM- stable 8. Afib: on cardizem and metoprolol, Coumadin per pharmacy. Cardiology involved now given tachycardia - midodrine added by cardiology to allow titration of blocking agents.   Pearson Grippe MD

## 2018-04-12 NOTE — Progress Notes (Signed)
ANTICOAGULATION CONSULT NOTE - Follow-Up  Pharmacy Consult for Heparin  Indication: atrial fibrillation  Patient Measurements: Height: 6\' 2"  (188 cm) Weight: 196 lb 3.4 oz (89 kg) IBW/kg (Calculated) : 82.2  Vital Signs: Temp: 97.5 F (36.4 C) (02/19 0729) Temp Source: Oral (02/19 0729) BP: 112/76 (02/19 0729) Pulse Rate: 106 (02/19 0438)  Labs: Recent Labs    04/10/18 0344  04/11/18 0240 04/11/18 0847 04/12/18 0043 04/12/18 0235 04/12/18 1008  HGB 9.5*  --  9.3* 10.2*  --  8.3*  --   HCT 31.5*  --  30.1* 30.0*  --  26.7*  --   PLT 224  --  233  --   --  207  --   LABPROT  --   --   --   --   --  21.6*  --   INR  --   --   --   --   --  1.91  --   HEPARINUNFRC  --    < > 0.13*  --  0.15*  --  0.32  CREATININE 6.72*  --   --   --  9.72*  --   --    < > = values in this interval not displayed.    Estimated Creatinine Clearance: 8.9 mL/min (A) (by C-G formula based on SCr of 9.72 mg/dL (H)).  Assessment: 63 year old male on warfarin prior to admission for Afib currently on hold post L BKA 2/15.  Pt is being bridged with heparin and s/p R BKA on 2/18. Heparin has been resumed, now therapeutic on 1500 units/h, CBC relatively stable. Warfarin will be resumed when ok by [facility].   Goal of Therapy:  Heparin level 0.3-0.7 units/ml Monitor platelets by anticoagulation protocol: Yes   Plan:  -Continue heparin 1500 units/hr -Follow-up resuming warfarin -Daily heparin level and CBC  Arrie Senate, PharmD, BCPS Clinical Pharmacist [phone] Please check AMION for all [place] numbers 04/12/2018

## 2018-04-12 NOTE — Progress Notes (Signed)
Patient refused all meals on this shift, each time meal was offered patient stated '' I don't want to eat''. He also refused nepro. Patient given some apple juice and grape juice. Patient education completed will continue to monitor.

## 2018-04-12 NOTE — Progress Notes (Signed)
Report received form Tory RN from hemo stated pt had received pain medication while in Hemo and pt went little unresponsive and systolic Bp dropped to 59'D. After receiving 200 cc bolus Bp came back up and pt has been in and out of sleep.  Pt arrived to the floor around 0432, vitals stable . Ao x2. Easily reoriented, pt stated he is sleepy, denies pain. Call bell within reach. Heparin running at 62ml/hr. Will continue to monitor.

## 2018-04-12 NOTE — Progress Notes (Signed)
HD tx initiated via 15Gx2 w/o problem Pull/push/flush well w/o problem VSS Will continue to monitor while on HD tx 

## 2018-04-12 NOTE — Progress Notes (Signed)
PROGRESS NOTE  Albert [name] PRF:[id number] DOB: 1956/02/17 DOA: 03/20/2018 PCP: Redmond School, MD   LOS: 23 days   Brief Narrative / Interim history: 63 year old male with history of chronic A. fib on Coumadin, type 2 diabetes mellitus, end-stage renal disease on TTS HD was admitted to the hospital on 03/20/2018 with left lower extremity cellulitis in the setting of peripheral vascular disease.  This is a recurrent problem.  He was initially admitted any [facility] and then due to abnormal ABI he was transferred to [facility] for vascular surgery consultation.  He initially had several transmetatarsal amputations, however due to persistent wound healing eventually having a left BKA on 2/15 and a right BKA on 2/18  Subjective: Complains of hiccups, denies any chest pain, denies any shortness of breath.  No abdominal pain, nausea or vomiting.  Assessment & Plan: Principal Problem:   Cellulitis of left lower extremity Active Problems:   Essential hypertension, benign   Atrial fibrillation (HCC)   ESRD (end stage renal disease) (HCC)   Supratherapeutic INR   Necrosis of toe (HCC)   PVD (peripheral vascular disease) (HCC)   ESRD on dialysis ([place])   Pressure injury of skin   Hemodialysis-associated hypotension   Principal Problem Cellulitis of the left lower extremity, bilateral dry gangrenous toes, severe peripheral vascular disease -Patient initially had transmetatarsal amputation however due to persistent wound and foul-smelling eventually ended up having bilateral BKA's on 2/15 on the left and 2/18 on the right. -Dressing change tomorrow per vascular surgery -Completed Ancef  Active Problems Persistent atrial fibrillation -Now rate controlled on amiodarone, metoprolol, cardiology following and appreciate input -Awaiting vascular surgery input about when Coumadin could be restarted  Hypotension with underlying essential hypertension -Stable blood pressure, continue midodrine  but also continue rate controlling agents  End-stage renal disease -Nephrology following, underwent HD.  Last night on 2/18  Anemia of chronic renal disease -Hemoglobin is overall stable  Type 2 diabetes mellitus -Continue sliding scale, hold home oral agents   Scheduled Meds: . allopurinol  100 mg Oral Daily  . [START ON 04/14/2018] amiodarone  200 mg Oral Daily  . amiodarone  400 mg Oral BID  . aspirin EC  81 mg Oral Daily  . Chlorhexidine Gluconate Cloth  6 each Topical Q0600  . Chlorhexidine Gluconate Cloth  6 each Topical Q0600  . cinacalcet  30 mg Oral Q supper  . darbepoetin (ARANESP) injection - DIALYSIS  100 mcg Intravenous Q Sat-HD  . diltiazem  180 mg Oral Daily  . doxercalciferol  3.5 mcg Intravenous Q T,Th,Sa-HD  . insulin aspart  0-9 Units Subcutaneous TID WC  . metoprolol tartrate  50 mg Oral BID  . midodrine  10 mg Oral TID WC  . pantoprazole  40 mg Oral Daily  . pravastatin  5 mg Oral QPM  . senna-docusate  1 tablet Oral BID  . sevelamer carbonate  1,600 mg Oral With snacks  . sevelamer carbonate  3,200 mg Oral TID WC  . sodium chloride flush  3 mL Intravenous Q12H   Continuous Infusions: . sodium chloride    . sodium chloride    . sodium chloride    . sodium chloride Stopped (04/09/18 1426)  . heparin 1,500 Units/hr (04/12/18 0828)   PRN Meds:.sodium chloride, sodium chloride, sodium chloride, acetaminophen, alteplase, alum & mag hydroxide-simeth, bisacodyl, calcium carbonate, cyclobenzaprine, heparin, heparin, hydrALAZINE, labetalol, lidocaine (PF), lidocaine-prilocaine, morphine injection, ondansetron (ZOFRAN) IV, oxyCODONE, pentafluoroprop-tetrafluoroeth, phenol, polyethylene glycol, sodium chloride flush  DVT prophylaxis: Heparin  Code Status: Full code Family Communication: No family present at bedside Disposition Plan: SNF when cleared by vascular surgery  Consultants:   Neurology  Cardiology  Vascular surgery  Procedures:  03/27/18  Arteriogram with balloon angioplasty of the left posterior tibial artery Aorta and iliac segments are heavily calcified and tortuous however there is no flow-limiting stenosis. SFAs are heavily calcified no flow-limiting stenosis. Right side appears to have runoff via the posterior tibial and anterior tibial arteries without flow-limiting stenosis. Left side has runoff via the anterior tibial and peroneal posterior tibial occludes above the ankle. After intervention there is less than 30% residual stenosis and a strong signal at the ankle.Patient will need amputation of bilateral second toes at least and we will watch the ulceration of his heel on the left but he remains high risk for proximal leg amputations bilaterally.  03/29/18 1. left transmetatarsal amputation second toe. 2. Large amount of necrotic debris and gangrenous tissue toes 1 2 and 3 right foot requiring Transmetatarsal amputation of these rather than isolated second toe amputation.  04/08/18 LT BKA  04/11/18 Rt BKA  Antimicrobials:  Completed Ancef    Objective: Vitals:   04/12/18 0335 04/12/18 0422 04/12/18 0438 04/12/18 0729  BP: 129/62 (!) 149/64 110/71 112/76  Pulse: 75 87 (!) 106   Resp: (!) 34 (!) 22 20 20   Temp:  97.7 F (36.5 C) 97.8 F (36.6 C) (!) 97.5 F (36.4 C)  TempSrc:  Oral Axillary Oral  SpO2: 97% 97% 95% 99%  Weight:  89 kg    Height:        Intake/Output Summary (Last 24 hours) at 04/12/2018 1019 Last data filed at 04/12/2018 0910 Gross per 24 hour  Intake 682.38 ml  Output 2043 ml  Net -1360.62 ml   Filed Weights   04/11/18 0500 04/11/18 2325 04/12/18 0422  Weight: 93.1 kg 92 kg 89 kg    Examination:  Constitutional: NAD Eyes: PERRL, lids and conjunctivae normal ENMT: Mucous membranes are moist. Respiratory: clear to auscultation bilaterally, no wheezing, no crackles. Normal respiratory effort. No accessory muscle use.  Cardiovascular: Regular rate and rhythm, no murmurs / rubs  / gallops.  Abdomen: no tenderness. Musculoskeletal: Bilateral BKA, Ace wrapped Skin: no rashes, lesions, ulcers. No induration Neurologic: CN 2-12 grossly intact. Strength 5/5 in all 4.  Psychiatric: Normal judgment and insight. Alert and oriented x 3. Normal mood.    Data Reviewed: I have independently reviewed following labs and imaging studies   CBC: Recent Labs  Lab 04/07/18 2316 04/09/18 0406 04/10/18 0344 04/11/18 0240 04/11/18 0847 04/12/18 0235  WBC 10.6* 15.0* 15.8* 15.3*  --  19.7*  HGB 9.6* 9.2* 9.5* 9.3* 10.2* 8.3*  HCT 30.3* 28.6* 31.5* 30.1* 30.0* 26.7*  MCV 88.9 88.8 89.2 87.8  --  87.8  PLT 232 211 224 233  --  332   Basic Metabolic Panel: Recent Labs  Lab 04/07/18 0403 04/07/18 2316 04/09/18 0406 04/10/18 0344 04/11/18 0847 04/12/18 0043  NA 137 133* 136 136 132* 134*  K 4.4 4.3 4.2 4.6 5.1 5.6*  CL 96* 93* 96* 94*  --  95*  CO2 28 24 24 24   --  21*  GLUCOSE 152* 185* 208* 159* 136* 174*  BUN 30* 43* 23 38*  --  64*  CREATININE 6.42* 7.98* 5.00* 6.72*  --  9.72*  CALCIUM 8.6* 8.5* 8.3* 8.4*  --  8.1*  PHOS  --  4.5  --   --   --  6.2*   GFR: Estimated Creatinine Clearance: 8.9 mL/min (A) (by C-G formula based on SCr of 9.72 mg/dL (H)). Liver Function Tests: Recent Labs  Lab 04/07/18 2316 04/12/18 0043  ALBUMIN 1.7* 1.6*   No results for input(s): LIPASE, AMYLASE in the last 168 hours. No results for input(s): AMMONIA in the last 168 hours. Coagulation Profile: Recent Labs  Lab 04/06/18 0256 04/07/18 0403 04/12/18 0235  INR 3.10 1.59 1.91   Cardiac Enzymes: No results for input(s): CKTOTAL, CKMB, CKMBINDEX, TROPONINI in the last 168 hours. BNP (last 3 results) No results for input(s): PROBNP in the last 8760 hours. HbA1C: No results for input(s): HGBA1C in the last 72 hours. CBG: Recent Labs  Lab 04/11/18 1203 04/11/18 1603 04/11/18 2103 04/12/18 0259 04/12/18 0606  GLUCAP 143* 181* 144* 199* 161*   Lipid Profile: No  results for input(s): CHOL, HDL, LDLCALC, TRIG, CHOLHDL, LDLDIRECT in the last 72 hours. Thyroid Function Tests: No results for input(s): TSH, T4TOTAL, FREET4, T3FREE, THYROIDAB in the last 72 hours. Anemia Panel: No results for input(s): VITAMINB12, FOLATE, FERRITIN, TIBC, IRON, RETICCTPCT in the last 72 hours. Urine analysis:    Component Value Date/Time   COLORURINE YELLOW 10/12/2013 1045   APPEARANCEUR CLEAR 10/12/2013 1045   LABSPEC 1.015 10/12/2013 1045   PHURINE 8.0 10/12/2013 1045   GLUCOSEU NEGATIVE 10/12/2013 1045   HGBUR MODERATE (A) 10/12/2013 1045   BILIRUBINUR NEGATIVE 10/12/2013 1045   KETONESUR NEGATIVE 10/12/2013 1045   PROTEINUR 30 (A) 10/12/2013 1045   UROBILINOGEN 1.0 10/12/2013 1045   NITRITE NEGATIVE 10/12/2013 1045   LEUKOCYTESUR NEGATIVE 10/12/2013 1045   Sepsis Labs: Invalid input(s): PROCALCITONIN, LACTICIDVEN  No results found for this or any previous visit (from the past 240 hour(s)).    Radiology Studies: No results found.   Marzetta Board, MD, PhD [facility]  Contact via  [email or web address]  [place] P: [phone]  F: [phone]

## 2018-04-12 NOTE — Progress Notes (Signed)
Provided the patient with bed offers- patient states he has not had reviewed the SNF listings provided. Patient requested CSW contact his sister.   CSW spoke with the patient's sister, Ronney Lion and she states she will discuss places with the family and call CSW back  Thurmond Butts, [place] Worker [phone]

## 2018-04-12 NOTE — Progress Notes (Addendum)
     Subjective  - Doing OK over all.     Objective 112/76 (!) 106 (!) 97.5 F (36.4 C) (Oral) 20 99%  Intake/Output Summary (Last 24 hours) at 04/12/2018 6378 Last data filed at 04/12/2018 0335 Gross per 24 hour  Intake 632.38 ml  Output 2068 ml  Net -1435.62 ml    B LE amputations  B Limbs warm to touch  Assessment/Planning: POD # 3 left BKA, POD # 1 right BKA  Will change dressing tomorrow. Leukocytosis 19.7 will watch for post op changes.    Albert [name] 04/12/2018 8:22 AM --  Laboratory Lab Results: Recent Labs    04/11/18 0240 04/11/18 0847 04/12/18 0235  WBC 15.3*  --  19.7*  HGB 9.3* 10.2* 8.3*  HCT 30.1* 30.0* 26.7*  PLT 233  --  207   BMET Recent Labs    04/10/18 0344 04/11/18 0847 04/12/18 0043  NA 136 132* 134*  K 4.6 5.1 5.6*  CL 94*  --  95*  CO2 24  --  21*  GLUCOSE 159* 136* 174*  BUN 38*  --  64*  CREATININE 6.72*  --  9.72*  CALCIUM 8.4*  --  8.1*    COAG Lab Results  Component Value Date   INR 1.91 04/12/2018   INR 1.59 04/07/2018   INR 3.10 04/06/2018   No results found for: PTT  I have independently interviewed and examined patient and agree with PA assessment and plan above.  We will take dressings down tomorrow.  Patient has leukocytosis with no fevers.  Okay to restart Coumadin from vascular perspective.  Albert [name] C. Donzetta Matters, MD [facility] Office: [phone] Pager: [phone]

## 2018-04-12 NOTE — Progress Notes (Signed)
CSW advised the family of bed offer- they selected [place] contacted [place]- left voice message for [facility].  Thurmond Butts, [place] Social Worker [phone]

## 2018-04-12 NOTE — Evaluation (Addendum)
Occupational Therapy Evaluation Patient Details Name: Albert [name] MRN: [id number] DOB: 20-Dec-1955 Today's Date: 04/12/2018    History of Present Illness Pt is a 63 y.o. M with significant PMH of ESRD, PVD, who initially presented s/p right transmetarsal amputation. Now s/p L BKA 2/15 and right BKA 2/18.   Clinical Impression   PTA: living home alone; WC for mobility outside. Pt currently progressing with now BLE BKAs. Pt limited by pain and apprehension of pain with bed mobility maxA+2 rolling and sidelying to EOB; maxA to scoot hips out. Pt after 5 mins, pt able to tolerate sitting EOB with fair balance with dynamic sitting challenges.  Pt sitting EOB for ~20 mins with intermittent assist for balance. Pt education continues on desensitizing to new BKAs with tactile stimulation. Pt appears sad about situation requiring increased time to answer questions. Pt would benefit from continued OT skilled services for ADL, mobility and safety in SNF setting- possibly for long term due to decreased CG support and time to await prosthetics. OT to continue to follow acutely.    Follow Up Recommendations  SNF;Supervision/Assistance - 24 hour    Equipment Recommendations  Other (comment)(TBD)    Recommendations for Other Services       Precautions / Restrictions Precautions Precautions: Fall Precaution Comments: L BKA, R BKA Restrictions Weight Bearing Restrictions: Yes RLE Weight Bearing: Non weight bearing LLE Weight Bearing: Non weight bearing      Mobility Bed Mobility Overal bed mobility: Needs Assistance Bed Mobility: Supine to Sit;Sit to Supine     Supine to sit: Max assist;+2 for physical assistance Sit to supine: Max assist;+2 for physical assistance   General bed mobility comments: max cues for reaching contralateral upper extremity over for bed rail and use of bed pad for scooting hips toward edge of bed. increased assistance required due to pt actively resisting  secondary to pain  Transfers Overall transfer level: Needs assistance Equipment used: None   Sit to Stand: Max assist;+2 physical assistance         General transfer comment: Deferred    Balance Overall balance assessment: Needs assistance Sitting-balance support: Bilateral upper extremity supported Sitting balance-Leahy Scale: Fair Sitting balance - Comments: Able to progress to supervision with hands resting in lap versus bilateral UE supported. has difficulty relaxing residual limb due to guarding                                   ADL either performed or assessed with clinical judgement   ADL Overall ADL's : Needs assistance/impaired Eating/Feeding: Modified independent   Grooming: Set up;Bed level   Upper Body Bathing: Set up;Bed level   Lower Body Bathing: Moderate assistance;Sitting/lateral leans;Bed level   Upper Body Dressing : Sitting;Minimal assistance   Lower Body Dressing: Moderate assistance       Toileting- Clothing Manipulation and Hygiene: Total assistance       Functional mobility during ADLs: Maximal assistance;+2 for physical assistance;+2 for safety/equipment(using pad for hellicopter transfer from supine to EOB) General ADL Comments: Pt sitting EOB and once comfortable, pt able to perform movement of BUEs without assist to hold self up at EOB     Vision Baseline Vision/History: Wears glasses Vision Assessment?: No apparent visual deficits     Perception     Praxis      Pertinent Vitals/Pain Pain Assessment: Faces Pain Location: right residual limb Pain Descriptors / Indicators: Grimacing;Operative site guarding Pain  Intervention(s): Monitored during session;Limited activity within patient's tolerance     Hand Dominance Right   Extremity/Trunk Assessment Upper Extremity Assessment Upper Extremity Assessment: Generalized weakness(3+/5 MM grade )   Lower Extremity Assessment Lower Extremity Assessment: RLE  deficits/detail;LLE deficits/detail RLE Deficits / Details: s/p R BKA. At least anti graviy strength LLE Deficits / Details: S/p L BKA with gross weakness, unable to perform SLR, weak quad set   Cervical / Trunk Assessment Cervical / Trunk Assessment: Normal   Communication Communication Communication: No difficulties;Other (comment)(requires increased time to respond)   Cognition Arousal/Alertness: Awake/alert Behavior During Therapy: WFL for tasks assessed/performed Overall Cognitive Status: No family/caregiver present to determine baseline cognitive functioning                                 General Comments: pt more confused this session, stating, "how long have I been laying back?"   General Comments  Pt appears depressed. pt education provided on densensitizing BLEs, but pt appeared to be sad about situation requiring increased time to answer and follow commands.    Exercises     Shoulder Instructions      Home Living Family/patient expects to be discharged to:: Private residence Living Arrangements: Alone Available Help at Discharge: Family;Available PRN/intermittently Type of Home: Apartment Home Access: Ramped entrance     Home Layout: One level     Bathroom Shower/Tub: Teacher, early years/pre: Standard Bathroom Accessibility: No(all handicapped access rooms are taken)   Home Equipment: Walker - 2 wheels;Shower seat;Wheelchair - manual          Prior Functioning/Environment Level of Independence: Independent with assistive device(s)        Comments: "furniture walks," in apartment, uses wheelchair for community mobility, independent with ADLs/selfcare        OT Problem List: Decreased strength;Decreased range of motion;Impaired balance (sitting and/or standing);Decreased coordination;Decreased safety awareness;Pain;Increased edema      OT Treatment/Interventions: Self-care/ADL training;Therapeutic exercise;Neuromuscular  education;Energy conservation;DME and/or AE instruction;Therapeutic activities;Patient/family education;Balance training    OT Goals(Current goals can be found in the care plan section) Acute Rehab OT Goals Patient Stated Goal: decrease pain OT Goal Formulation: With patient Time For Goal Achievement: 04/26/18 Potential to Achieve Goals: Good  OT Frequency: Min 2X/week   Barriers to D/C: Decreased caregiver support  lives alone. No personnel can assist daily.       Co-evaluation PT/OT/SLP Co-Evaluation/Treatment: Yes Reason for Co-Treatment: For patient/therapist safety PT goals addressed during session: Mobility/safety with mobility OT goals addressed during session: ADL's and self-care;Strengthening/ROM      AM-PAC OT "6 Clicks" Daily Activity     Outcome Measure Help from another person eating meals?: None Help from another person taking care of personal grooming?: None Help from another person toileting, which includes using toliet, bedpan, or urinal?: Total Help from another person bathing (including washing, rinsing, drying)?: A Lot Help from another person to put on and taking off regular upper body clothing?: None Help from another person to put on and taking off regular lower body clothing?: Total 6 Click Score: 16   End of Session    Activity Tolerance: Patient limited by pain Patient left: in bed;with call bell/phone within reach  OT Visit Diagnosis: Unsteadiness on feet (R26.81);Muscle weakness (generalized) (M62.81) Pain - Right/Left: Right Pain - part of body: Leg                Time: 0827-0900 OT  Time Calculation (min): 33 min Charges:  OT General Charges $OT Visit: 1 Visit OT Evaluation $OT Re-eval: 1 Re-eval  Darryl Nestle) Marsa Aris OTR/L Acute Rehabilitation Services Pager: [phone] Office: [phone]   Fredda Hammed 04/12/2018, 10:20 AM

## 2018-04-12 NOTE — Progress Notes (Signed)
HD tx completed @ 0335 w/ clotting issues throughout tx, increased HR issues last 1.5 hrs of tx, and pt passed out when bp dropped after oxy admin but recovered quickly after NS bolus admin/trendlenberg/UF off UF goal still met Blood rinsed back VSS Report called to Lattie Haw, RN

## 2018-04-13 LAB — CBC
HCT: 29.7 % — ABNORMAL LOW (ref 39.0–52.0)
HEMOGLOBIN: 9 g/dL — AB (ref 13.0–17.0)
MCH: 27.4 pg (ref 26.0–34.0)
MCHC: 30.3 g/dL (ref 30.0–36.0)
MCV: 90.3 fL (ref 80.0–100.0)
Platelets: 244 10*3/uL (ref 150–400)
RBC: 3.29 MIL/uL — ABNORMAL LOW (ref 4.22–5.81)
RDW: 16.2 % — ABNORMAL HIGH (ref 11.5–15.5)
WBC: 19.7 10*3/uL — ABNORMAL HIGH (ref 4.0–10.5)
nRBC: 0.5 % — ABNORMAL HIGH (ref 0.0–0.2)

## 2018-04-13 LAB — RENAL FUNCTION PANEL
Albumin: 1.7 g/dL — ABNORMAL LOW (ref 3.5–5.0)
Anion gap: 22 — ABNORMAL HIGH (ref 5–15)
BUN: 56 mg/dL — AB (ref 8–23)
CO2: 19 mmol/L — ABNORMAL LOW (ref 22–32)
Calcium: 8.6 mg/dL — ABNORMAL LOW (ref 8.9–10.3)
Chloride: 95 mmol/L — ABNORMAL LOW (ref 98–111)
Creatinine, Ser: 8.54 mg/dL — ABNORMAL HIGH (ref 0.61–1.24)
GFR calc Af Amer: 7 mL/min — ABNORMAL LOW (ref >60)
GFR calc non Af Amer: 6 mL/min — ABNORMAL LOW (ref >60)
Glucose, Bld: 169 mg/dL — ABNORMAL HIGH (ref 70–99)
POTASSIUM: 4.9 mmol/L (ref 3.5–5.1)
Phosphorus: 6.2 mg/dL — ABNORMAL HIGH (ref 2.5–4.6)
Sodium: 136 mmol/L (ref 135–145)

## 2018-04-13 LAB — GLUCOSE, CAPILLARY
GLUCOSE-CAPILLARY: 133 mg/dL — AB (ref 70–99)
Glucose-Capillary: 163 mg/dL — ABNORMAL HIGH (ref 70–99)
Glucose-Capillary: 111 mg/dL — ABNORMAL HIGH (ref 70–99)
Glucose-Capillary: 122 mg/dL — ABNORMAL HIGH (ref 70–99)
Glucose-Capillary: 145 mg/dL — ABNORMAL HIGH (ref 70–99)

## 2018-04-13 LAB — PROTIME-INR
INR: 2.56
Prothrombin Time: 27.1 seconds — ABNORMAL HIGH (ref 11.4–15.2)

## 2018-04-13 LAB — HEPARIN LEVEL (UNFRACTIONATED): Heparin Unfractionated: 0.32 IU/mL (ref 0.30–0.70)

## 2018-04-13 MED ORDER — WARFARIN - PHARMACIST DOSING INPATIENT: Freq: Every day | Status: DC

## 2018-04-13 MED ORDER — WARFARIN 0.5 MG HALF TABLET
0.5000 mg | ORAL_TABLET | Freq: Once | ORAL | Status: AC
  Administered 2018-04-13: 0.5 mg via ORAL
  Filled 2018-04-13: qty 1

## 2018-04-13 MED ORDER — OXYCODONE HCL 5 MG PO TABS
ORAL_TABLET | ORAL | Status: AC
  Filled 2018-04-13: qty 1

## 2018-04-13 MED ORDER — DOXERCALCIFEROL 4 MCG/2ML IV SOLN
INTRAVENOUS | Status: AC
  Filled 2018-04-13: qty 2

## 2018-04-13 NOTE — Progress Notes (Signed)
ANTICOAGULATION CONSULT NOTE - Follow Up Consult  Pharmacy Consult for Heparin and Coumadin Indication: atrial fibrillation  Allergies  Allergen Reactions  . Metformin And Related Other (See Comments)    Diarrhea, bloody stool, upset stomach.     Patient Measurements: Height: 6\' 2"  (188 cm) Weight: 194 lb 7.1 oz (88.2 kg) IBW/kg (Calculated) : 82.2 Heparin Dosing Weight: 88 kg  Vital Signs: Temp: 97.7 F (36.5 C) (02/20 1042) Temp Source: Oral (02/20 1042) BP: 116/57 (02/20 1042) Pulse Rate: 104 (02/20 1042)  Labs: Recent Labs    04/11/18 0240 04/11/18 0847 04/12/18 0043 04/12/18 0235 04/12/18 1008 04/13/18 0741 04/13/18 0742 04/13/18 1209  HGB 9.3* 10.2*  --  8.3*  --   --  9.0*  --   HCT 30.1* 30.0*  --  26.7*  --   --  29.7*  --   PLT 233  --   --  207  --   --  244  --   LABPROT  --   --   --  21.6*  --   --   --  27.1*  INR  --   --   --  1.91  --   --   --  2.56  HEPARINUNFRC 0.13*  --  0.15*  --  0.32  --   --  0.32  CREATININE  --   --  9.72*  --   --  8.54*  --   --    Assessment:  63 year old male on Coumadin prior to admission for Afib, which has been on for L BKA 2/15 and R BKA on 2/18. On heparin bridge, which was resumed 2/18 pm.  Coumadin to resume today.       Heparin level remains low therapeutic (0.32) on 1500 units/hr.   INR 1.91 on 2/19 and up to 2.56 today.  Last Coumadin dose 03/31/08.   INR was 3.10 on 2/13 and Vitamin K 5 mg PO was given > INR 1.59 on 2/14. No additional INRs until 2/19. Amiodarone added 2/14, which may be contributing to increased INR despite no Coumadin doses.  Discussed with Dr. Cruzita Lederer.    Coumadin regimen PTA:  2.5 mg daily.  INR 3.79 on admit 03/20/18.  Goal of Therapy:  INR 2-3 Heparin level 0.3-0.7 units/ml Monitor platelets by anticoagulation protocol: Yes   Plan:   Continue heparin drip at 1500 units/hr.  Coumadin 0.5 mg x 1 today.  Daily heparin level, PT/INR and CBC.  Stop heparin on 2/21 if INR remains  therapeutic.  Expect discharge Coumadin dose to be lower that PTA dose.  Arty Baumgartner, [place] Pager: [phone] or phone: [phone] 04/13/2018,2:26 PM

## 2018-04-13 NOTE — Progress Notes (Signed)
Orthopedic Tech Progress Note Patient Details:  Albert [name] May 03, 1955 [id number] Placed order with [facility] - Tech  Patient ID: Albert [name], male   DOB: 1955-10-23, 63 y.o.   MRN: [id number]   [place] Comment 04/13/2018, 1:46 PM

## 2018-04-13 NOTE — Progress Notes (Signed)
PROGRESS NOTE  Albert [name] EGB:[id number] DOB: 1955/08/26 DOA: 03/20/2018 PCP: Redmond School, MD   LOS: 24 days   Brief Narrative / Interim history: 63 year old male with history of chronic A. fib on Coumadin, type 2 diabetes mellitus, end-stage renal disease on TTS HD was admitted to the hospital on 03/20/2018 with left lower extremity cellulitis in the setting of peripheral vascular disease.  This is a recurrent problem.  He was initially admitted any [facility] and then due to abnormal ABI he was transferred to [facility] for vascular surgery consultation.  He initially had several transmetatarsal amputations, however due to persistent wound healing eventually having a left BKA on 2/15 and a right BKA on 2/18  Subjective: Seen in dialysis, he is feeling a little bit better, no longer has the hiccups.  No chest pain, no shortness of breath.  Assessment & Plan: Principal Problem:   Cellulitis of left lower extremity Active Problems:   Essential hypertension, benign   Atrial fibrillation (HCC)   ESRD (end stage renal disease) (HCC)   Supratherapeutic INR   Necrosis of toe (HCC)   PVD (peripheral vascular disease) (HCC)   ESRD on dialysis ([place])   Pressure injury of skin   Hemodialysis-associated hypotension   Principal Problem Cellulitis of the left lower extremity, bilateral dry gangrenous toes, severe peripheral vascular disease -Patient initially had transmetatarsal amputation however due to persistent wound and foul-smelling eventually ended up having bilateral BKA's on 2/15 on the left and 2/18 on the right. -Dressing change today per vascular surgery  Active Problems Persistent atrial fibrillation -Now rate controlled on amiodarone, metoprolol, cardiology following and appreciate input -Resume Coumadin per vascular surgery  Hypotension with underlying essential hypertension -Stable blood pressure, continue midodrine but also continue rate controlling agents -Blood  pressure stable today, tolerating dialysis  End-stage renal disease -Nephrology following, underwent HD last on 2/20  Anemia of chronic renal disease -Hemoglobin is overall stable, 9.0 this morning  Type 2 diabetes mellitus -Continue sliding scale, hold home oral agents, CBGs controlled in the 120s-160s today   Scheduled Meds: . allopurinol  100 mg Oral Daily  . [START ON 04/14/2018] amiodarone  200 mg Oral Daily  . amiodarone  400 mg Oral BID  . aspirin EC  81 mg Oral Daily  . Chlorhexidine Gluconate Cloth  6 each Topical Q0600  . cinacalcet  30 mg Oral Q supper  . darbepoetin (ARANESP) injection - DIALYSIS  100 mcg Intravenous Q Sat-HD  . diltiazem  180 mg Oral Daily  . doxercalciferol  3.5 mcg Intravenous Q T,Th,Sa-HD  . insulin aspart  0-9 Units Subcutaneous TID WC  . metoprolol tartrate  50 mg Oral BID  . midodrine  10 mg Oral TID WC  . pantoprazole  40 mg Oral Daily  . pravastatin  5 mg Oral QPM  . senna-docusate  1 tablet Oral BID  . sevelamer carbonate  1,600 mg Oral With snacks  . sevelamer carbonate  3,200 mg Oral TID WC  . sodium chloride flush  3 mL Intravenous Q12H   Continuous Infusions: . sodium chloride    . sodium chloride    . sodium chloride    . heparin 1,500 Units/hr (04/13/18 0700)   PRN Meds:.sodium chloride, sodium chloride, sodium chloride, acetaminophen, alteplase, alum & mag hydroxide-simeth, bisacodyl, calcium carbonate, cyclobenzaprine, heparin, heparin, hydrALAZINE, labetalol, lidocaine (PF), lidocaine-prilocaine, morphine injection, ondansetron (ZOFRAN) IV, oxyCODONE, pentafluoroprop-tetrafluoroeth, phenol, polyethylene glycol, sodium chloride flush  DVT prophylaxis: Heparin Code Status: Full code Family Communication: No  family present at bedside Disposition Plan: SNF, awaiting insurance authorization  Consultants:   Neurology  Cardiology  Vascular surgery  Procedures:  03/27/18 Arteriogram with balloon angioplasty of the left  posterior tibial artery Aorta and iliac segments are heavily calcified and tortuous however there is no flow-limiting stenosis. SFAs are heavily calcified no flow-limiting stenosis. Right side appears to have runoff via the posterior tibial and anterior tibial arteries without flow-limiting stenosis. Left side has runoff via the anterior tibial and peroneal posterior tibial occludes above the ankle. After intervention there is less than 30% residual stenosis and a strong signal at the ankle.Patient will need amputation of bilateral second toes at least and we will watch the ulceration of his heel on the left but he remains high risk for proximal leg amputations bilaterally.  03/29/18 1. left transmetatarsal amputation second toe. 2. Large amount of necrotic debris and gangrenous tissue toes 1 2 and 3 right foot requiring Transmetatarsal amputation of these rather than isolated second toe amputation.  04/08/18 LT BKA  04/11/18 Rt BKA  Antimicrobials:  Completed Ancef    Objective: Vitals:   04/13/18 0930 04/13/18 1000 04/13/18 1030 04/13/18 1042  BP: (!) 104/51 (!) 104/53 (!) 98/53 (!) 116/57  Pulse: 82 83 78 (!) 104  Resp:    (!) 22  Temp:    97.7 F (36.5 C)  TempSrc:    Oral  SpO2:    92%  Weight:    88.2 kg  Height:        Intake/Output Summary (Last 24 hours) at 04/13/2018 1258 Last data filed at 04/13/2018 1042 Gross per 24 hour  Intake 767.98 ml  Output 600 ml  Net 167.98 ml   Filed Weights   04/13/18 0439 04/13/18 0702 04/13/18 1042  Weight: 88.2 kg 88.7 kg 88.2 kg    Examination:  Constitutional: No distress Eyes: No scleral icterus ENMT: Moist mucous membranes Respiratory: Clear to auscultation bilaterally without wheezing or crackles.  Normal respiratory effort Cardiovascular: Regular rate and rhythm, no murmurs appreciated.  No edema. Abdomen: Soft, nontender, nondistended, bowel sounds positive Musculoskeletal: Bilateral BKA, Ace wrapped Skin: No  rashes seen Neurologic: No focal deficits upper extremities Psychiatric: Normal judgment and insight. Alert and oriented x 3. Normal mood.    Data Reviewed: I have independently reviewed following labs and imaging studies   CBC: Recent Labs  Lab 04/09/18 0406 04/10/18 0344 04/11/18 0240 04/11/18 0847 04/12/18 0235 04/13/18 0742  WBC 15.0* 15.8* 15.3*  --  19.7* 19.7*  HGB 9.2* 9.5* 9.3* 10.2* 8.3* 9.0*  HCT 28.6* 31.5* 30.1* 30.0* 26.7* 29.7*  MCV 88.8 89.2 87.8  --  87.8 90.3  PLT 211 224 233  --  207 628   Basic Metabolic Panel: Recent Labs  Lab 04/07/18 2316 04/09/18 0406 04/10/18 0344 04/11/18 0847 04/12/18 0043 04/13/18 0741  NA 133* 136 136 132* 134* 136  K 4.3 4.2 4.6 5.1 5.6* 4.9  CL 93* 96* 94*  --  95* 95*  CO2 24 24 24   --  21* 19*  GLUCOSE 185* 208* 159* 136* 174* 169*  BUN 43* 23 38*  --  64* 56*  CREATININE 7.98* 5.00* 6.72*  --  9.72* 8.54*  CALCIUM 8.5* 8.3* 8.4*  --  8.1* 8.6*  PHOS 4.5  --   --   --  6.2* 6.2*   GFR: Estimated Creatinine Clearance: 10.2 mL/min (A) (by C-G formula based on SCr of 8.54 mg/dL (H)). Liver Function Tests: Recent Labs  Lab  04/07/18 2316 04/12/18 0043 04/13/18 0741  ALBUMIN 1.7* 1.6* 1.7*   No results for input(s): LIPASE, AMYLASE in the last 168 hours. No results for input(s): AMMONIA in the last 168 hours. Coagulation Profile: Recent Labs  Lab 04/07/18 0403 04/12/18 0235  INR 1.59 1.91   Cardiac Enzymes: No results for input(s): CKTOTAL, CKMB, CKMBINDEX, TROPONINI in the last 168 hours. BNP (last 3 results) No results for input(s): PROBNP in the last 8760 hours. HbA1C: No results for input(s): HGBA1C in the last 72 hours. CBG: Recent Labs  Lab 04/12/18 1119 04/12/18 1645 04/12/18 2031 04/13/18 0621 04/13/18 1208  GLUCAP 127* 166* 162* 163* 122*   Lipid Profile: No results for input(s): CHOL, HDL, LDLCALC, TRIG, CHOLHDL, LDLDIRECT in the last 72 hours. Thyroid Function Tests: No results for  input(s): TSH, T4TOTAL, FREET4, T3FREE, THYROIDAB in the last 72 hours. Anemia Panel: No results for input(s): VITAMINB12, FOLATE, FERRITIN, TIBC, IRON, RETICCTPCT in the last 72 hours. Urine analysis:    Component Value Date/Time   COLORURINE YELLOW 10/12/2013 1045   APPEARANCEUR CLEAR 10/12/2013 1045   LABSPEC 1.015 10/12/2013 1045   PHURINE 8.0 10/12/2013 1045   GLUCOSEU NEGATIVE 10/12/2013 1045   HGBUR MODERATE (A) 10/12/2013 1045   BILIRUBINUR NEGATIVE 10/12/2013 1045   KETONESUR NEGATIVE 10/12/2013 1045   PROTEINUR 30 (A) 10/12/2013 1045   UROBILINOGEN 1.0 10/12/2013 1045   NITRITE NEGATIVE 10/12/2013 1045   LEUKOCYTESUR NEGATIVE 10/12/2013 1045   Sepsis Labs: Invalid input(s): PROCALCITONIN, LACTICIDVEN  No results found for this or any previous visit (from the past 240 hour(s)).    Radiology Studies: No results found.   Marzetta Board, MD, PhD [facility]  Contact via  [email or web address]  [place] P: [phone]  F: [phone]

## 2018-04-13 NOTE — Procedures (Signed)
I was present at this dialysis session. I have reviewed the session itself and made appropriate changes.   AVF Qb 400. 2K, 2L UF.  Tol well.     For SNF.  No inpatient renal issues. Can return to [facility]S schedule [facility] upon DC  [facility] Weights   04/12/18 0422 04/13/18 0439 04/13/18 0702  Weight: 89 kg 88.2 kg 88.7 kg    Recent Labs  Lab 04/13/18 0741  NA 136  K 4.9  CL 95*  CO2 19*  GLUCOSE 169*  BUN 56*  CREATININE 8.54*  CALCIUM 8.6*  PHOS 6.2*    Recent Labs  Lab 04/11/18 0240 04/11/18 0847 04/12/18 0235 04/13/18 0742  WBC 15.3*  --  19.7* 19.7*  HGB 9.3* 10.2* 8.3* 9.0*  HCT 30.1* 30.0* 26.7* 29.7*  MCV 87.8  --  87.8 90.3  PLT 233  --  207 244    Scheduled Meds: . allopurinol  100 mg Oral Daily  . [START ON 04/14/2018] amiodarone  200 mg Oral Daily  . amiodarone  400 mg Oral BID  . aspirin EC  81 mg Oral Daily  . Chlorhexidine Gluconate Cloth  6 each Topical Q0600  . cinacalcet  30 mg Oral Q supper  . darbepoetin (ARANESP) injection - DIALYSIS  100 mcg Intravenous Q Sat-HD  . diltiazem  180 mg Oral Daily  . doxercalciferol  3.5 mcg Intravenous Q T,Th,Sa-HD  . insulin aspart  0-9 Units Subcutaneous TID WC  . metoprolol tartrate  50 mg Oral BID  . midodrine  10 mg Oral TID WC  . pantoprazole  40 mg Oral Daily  . pravastatin  5 mg Oral QPM  . senna-docusate  1 tablet Oral BID  . sevelamer carbonate  1,600 mg Oral With snacks  . sevelamer carbonate  3,200 mg Oral TID WC  . sodium chloride flush  3 mL Intravenous Q12H   Continuous Infusions: . sodium chloride    . sodium chloride    . sodium chloride    . heparin 1,500 Units/hr (04/13/18 0700)   PRN Meds:.sodium chloride, sodium chloride, sodium chloride, acetaminophen, alteplase, alum & mag hydroxide-simeth, bisacodyl, calcium carbonate, cyclobenzaprine, heparin, heparin, hydrALAZINE, labetalol, lidocaine (PF), lidocaine-prilocaine, morphine injection, ondansetron (ZOFRAN) IV, oxyCODONE,  pentafluoroprop-tetrafluoroeth, phenol, polyethylene glycol, sodium chloride flush   Pearson Grippe  MD 04/13/2018, 9:04 AM

## 2018-04-13 NOTE — Progress Notes (Signed)
  Progress Note    04/13/2018 1:22 PM 2 Days Post-Op  Subjective: no overnight issues  Vitals:   04/13/18 1030 04/13/18 1042  BP: (!) 98/53 (!) 116/57  Pulse: 78 (!) 104  Resp:  (!) 22  Temp:  97.7 F (36.5 C)  SpO2:  92%    Physical Exam: aaox3 ibilateral bka sites cdi with staples  CBC    Component Value Date/Time   WBC 19.7 (H) 04/13/2018 0742   RBC 3.29 (L) 04/13/2018 0742   HGB 9.0 (L) 04/13/2018 0742   HCT 29.7 (L) 04/13/2018 0742   PLT 244 04/13/2018 0742   MCV 90.3 04/13/2018 0742   MCH 27.4 04/13/2018 0742   MCHC 30.3 04/13/2018 0742   RDW 16.2 (H) 04/13/2018 0742   LYMPHSABS 0.7 03/20/2018 1931   MONOABS 0.7 03/20/2018 1931   EOSABS 0.1 03/20/2018 1931   BASOSABS 0.0 03/20/2018 1931    BMET    Component Value Date/Time   NA 136 04/13/2018 0741   K 4.9 04/13/2018 0741   CL 95 (L) 04/13/2018 0741   CO2 19 (L) 04/13/2018 0741   GLUCOSE 169 (H) 04/13/2018 0741   BUN 56 (H) 04/13/2018 0741   CREATININE 8.54 (H) 04/13/2018 0741   CALCIUM 8.6 (L) 04/13/2018 0741   CALCIUM 9.0 03/21/2007 0415   GFRNONAA 6 (L) 04/13/2018 0741   GFRAA 7 (L) 04/13/2018 0741    INR    Component Value Date/Time   INR 2.56 04/13/2018 1209     Intake/Output Summary (Last 24 hours) at 04/13/2018 1322 Last data filed at 04/13/2018 1042 Gross per 24 hour  Intake 767.98 ml  Output 600 ml  Net 167.98 ml     Assessment/plan:  63 y.o. male is status post bilateral BKA.  Doing well with leukocytosis but appears to be healing his BKA sites without evidence of infection.   Kynzlee Hucker C. Donzetta Matters, MD [facility] [place] Office: [phone] Pager: [phone]  04/13/2018 1:22 PM

## 2018-04-14 DIAGNOSIS — Z9911 Dependence on respirator [ventilator] status: Secondary | ICD-10-CM | POA: Diagnosis not present

## 2018-04-14 DIAGNOSIS — N186 End stage renal disease: Secondary | ICD-10-CM | POA: Diagnosis not present

## 2018-04-14 DIAGNOSIS — R0902 Hypoxemia: Secondary | ICD-10-CM | POA: Diagnosis not present

## 2018-04-14 DIAGNOSIS — E1165 Type 2 diabetes mellitus with hyperglycemia: Secondary | ICD-10-CM | POA: Diagnosis not present

## 2018-04-14 DIAGNOSIS — Z66 Do not resuscitate: Secondary | ICD-10-CM | POA: Diagnosis not present

## 2018-04-14 DIAGNOSIS — Z7901 Long term (current) use of anticoagulants: Secondary | ICD-10-CM | POA: Diagnosis not present

## 2018-04-14 DIAGNOSIS — Z992 Dependence on renal dialysis: Secondary | ICD-10-CM | POA: Diagnosis not present

## 2018-04-14 DIAGNOSIS — E1122 Type 2 diabetes mellitus with diabetic chronic kidney disease: Secondary | ICD-10-CM | POA: Diagnosis not present

## 2018-04-14 DIAGNOSIS — N2581 Secondary hyperparathyroidism of renal origin: Secondary | ICD-10-CM | POA: Diagnosis not present

## 2018-04-14 DIAGNOSIS — E1142 Type 2 diabetes mellitus with diabetic polyneuropathy: Secondary | ICD-10-CM | POA: Diagnosis not present

## 2018-04-14 DIAGNOSIS — G9341 Metabolic encephalopathy: Secondary | ICD-10-CM | POA: Diagnosis not present

## 2018-04-14 DIAGNOSIS — G4733 Obstructive sleep apnea (adult) (pediatric): Secondary | ICD-10-CM | POA: Diagnosis not present

## 2018-04-14 DIAGNOSIS — L03116 Cellulitis of left lower limb: Secondary | ICD-10-CM | POA: Diagnosis not present

## 2018-04-14 DIAGNOSIS — E1151 Type 2 diabetes mellitus with diabetic peripheral angiopathy without gangrene: Secondary | ICD-10-CM | POA: Diagnosis not present

## 2018-04-14 DIAGNOSIS — I428 Other cardiomyopathies: Secondary | ICD-10-CM | POA: Diagnosis not present

## 2018-04-14 DIAGNOSIS — E119 Type 2 diabetes mellitus without complications: Secondary | ICD-10-CM | POA: Diagnosis not present

## 2018-04-14 DIAGNOSIS — A419 Sepsis, unspecified organism: Secondary | ICD-10-CM | POA: Diagnosis not present

## 2018-04-14 DIAGNOSIS — I4819 Other persistent atrial fibrillation: Secondary | ICD-10-CM | POA: Diagnosis not present

## 2018-04-14 DIAGNOSIS — J9602 Acute respiratory failure with hypercapnia: Secondary | ICD-10-CM | POA: Diagnosis not present

## 2018-04-14 DIAGNOSIS — R4182 Altered mental status, unspecified: Secondary | ICD-10-CM | POA: Diagnosis not present

## 2018-04-14 DIAGNOSIS — R404 Transient alteration of awareness: Secondary | ICD-10-CM | POA: Diagnosis not present

## 2018-04-14 DIAGNOSIS — J9601 Acute respiratory failure with hypoxia: Secondary | ICD-10-CM | POA: Diagnosis not present

## 2018-04-14 DIAGNOSIS — R279 Unspecified lack of coordination: Secondary | ICD-10-CM | POA: Diagnosis not present

## 2018-04-14 DIAGNOSIS — Z515 Encounter for palliative care: Secondary | ICD-10-CM | POA: Diagnosis not present

## 2018-04-14 DIAGNOSIS — I120 Hypertensive chronic kidney disease with stage 5 chronic kidney disease or end stage renal disease: Secondary | ICD-10-CM | POA: Diagnosis not present

## 2018-04-14 DIAGNOSIS — R6521 Severe sepsis with septic shock: Secondary | ICD-10-CM | POA: Diagnosis not present

## 2018-04-14 DIAGNOSIS — D638 Anemia in other chronic diseases classified elsewhere: Secondary | ICD-10-CM | POA: Diagnosis not present

## 2018-04-14 DIAGNOSIS — R000 Tachycardia, unspecified: Secondary | ICD-10-CM | POA: Diagnosis not present

## 2018-04-14 DIAGNOSIS — Y95 Nosocomial condition: Secondary | ICD-10-CM | POA: Diagnosis not present

## 2018-04-14 DIAGNOSIS — R05 Cough: Secondary | ICD-10-CM | POA: Diagnosis not present

## 2018-04-14 DIAGNOSIS — Z4781 Encounter for orthopedic aftercare following surgical amputation: Secondary | ICD-10-CM | POA: Diagnosis not present

## 2018-04-14 DIAGNOSIS — J189 Pneumonia, unspecified organism: Secondary | ICD-10-CM | POA: Diagnosis not present

## 2018-04-14 DIAGNOSIS — E7220 Disorder of urea cycle metabolism, unspecified: Secondary | ICD-10-CM | POA: Diagnosis not present

## 2018-04-14 DIAGNOSIS — L089 Local infection of the skin and subcutaneous tissue, unspecified: Secondary | ICD-10-CM | POA: Diagnosis not present

## 2018-04-14 DIAGNOSIS — R4020 Unspecified coma: Secondary | ICD-10-CM | POA: Diagnosis not present

## 2018-04-14 DIAGNOSIS — Z743 Need for continuous supervision: Secondary | ICD-10-CM | POA: Diagnosis not present

## 2018-04-14 DIAGNOSIS — I482 Chronic atrial fibrillation, unspecified: Secondary | ICD-10-CM | POA: Diagnosis not present

## 2018-04-14 DIAGNOSIS — D631 Anemia in chronic kidney disease: Secondary | ICD-10-CM | POA: Diagnosis not present

## 2018-04-14 DIAGNOSIS — L97524 Non-pressure chronic ulcer of other part of left foot with necrosis of bone: Secondary | ICD-10-CM | POA: Diagnosis not present

## 2018-04-14 DIAGNOSIS — I953 Hypotension of hemodialysis: Secondary | ICD-10-CM | POA: Diagnosis not present

## 2018-04-14 DIAGNOSIS — Z89512 Acquired absence of left leg below knee: Secondary | ICD-10-CM | POA: Diagnosis not present

## 2018-04-14 DIAGNOSIS — Z89511 Acquired absence of right leg below knee: Secondary | ICD-10-CM | POA: Diagnosis not present

## 2018-04-14 DIAGNOSIS — R0689 Other abnormalities of breathing: Secondary | ICD-10-CM | POA: Diagnosis not present

## 2018-04-14 DIAGNOSIS — E875 Hyperkalemia: Secondary | ICD-10-CM | POA: Diagnosis not present

## 2018-04-14 DIAGNOSIS — I10 Essential (primary) hypertension: Secondary | ICD-10-CM | POA: Diagnosis not present

## 2018-04-14 DIAGNOSIS — M87078 Idiopathic aseptic necrosis of left toe(s): Secondary | ICD-10-CM | POA: Diagnosis not present

## 2018-04-14 DIAGNOSIS — I4891 Unspecified atrial fibrillation: Secondary | ICD-10-CM | POA: Diagnosis not present

## 2018-04-14 DIAGNOSIS — D649 Anemia, unspecified: Secondary | ICD-10-CM | POA: Diagnosis not present

## 2018-04-14 DIAGNOSIS — J181 Lobar pneumonia, unspecified organism: Secondary | ICD-10-CM | POA: Diagnosis not present

## 2018-04-14 LAB — HEPATIC FUNCTION PANEL
ALT: 15 U/L (ref 0–44)
AST: 144 U/L — ABNORMAL HIGH (ref 15–41)
Albumin: 1.9 g/dL — ABNORMAL LOW (ref 3.5–5.0)
Alkaline Phosphatase: 73 U/L (ref 38–126)
Bilirubin, Direct: 0.4 mg/dL — ABNORMAL HIGH (ref 0.0–0.2)
Indirect Bilirubin: 0.7 mg/dL (ref 0.3–0.9)
Total Bilirubin: 1.1 mg/dL (ref 0.3–1.2)
Total Protein: 6.4 g/dL — ABNORMAL LOW (ref 6.5–8.1)

## 2018-04-14 LAB — CBC
HCT: 30.2 % — ABNORMAL LOW (ref 39.0–52.0)
Hemoglobin: 9.1 g/dL — ABNORMAL LOW (ref 13.0–17.0)
MCH: 27.5 pg (ref 26.0–34.0)
MCHC: 30.1 g/dL (ref 30.0–36.0)
MCV: 91.2 fL (ref 80.0–100.0)
Platelets: 217 10*3/uL (ref 150–400)
RBC: 3.31 MIL/uL — ABNORMAL LOW (ref 4.22–5.81)
RDW: 16.3 % — ABNORMAL HIGH (ref 11.5–15.5)
WBC: 17.8 10*3/uL — AB (ref 4.0–10.5)
nRBC: 1.9 % — ABNORMAL HIGH (ref 0.0–0.2)

## 2018-04-14 LAB — GLUCOSE, CAPILLARY
Glucose-Capillary: 165 mg/dL — ABNORMAL HIGH (ref 70–99)
Glucose-Capillary: 156 mg/dL — ABNORMAL HIGH (ref 70–99)
Glucose-Capillary: 148 mg/dL — ABNORMAL HIGH (ref 70–99)

## 2018-04-14 LAB — PROTIME-INR
INR: 2.42
Prothrombin Time: 26 seconds — ABNORMAL HIGH (ref 11.4–15.2)

## 2018-04-14 LAB — HEPARIN LEVEL (UNFRACTIONATED): Heparin Unfractionated: 0.62 IU/mL (ref 0.30–0.70)

## 2018-04-14 MED ORDER — METOPROLOL TARTRATE 50 MG PO TABS: 50.0000 mg | ORAL_TABLET | Freq: Two times a day (BID) | ORAL | Status: AC

## 2018-04-14 MED ORDER — ASPIRIN 81 MG PO TBEC: 81.0000 mg | DELAYED_RELEASE_TABLET | Freq: Every day | ORAL | Status: AC

## 2018-04-14 MED ORDER — MIDODRINE HCL 10 MG PO TABS: 10.0000 mg | ORAL_TABLET | Freq: Three times a day (TID) | ORAL | Status: AC

## 2018-04-14 MED ORDER — AMIODARONE HCL 200 MG PO TABS: 200.0000 mg | ORAL_TABLET | Freq: Every day | ORAL | Status: AC

## 2018-04-14 MED ORDER — OXYCODONE HCL 5 MG PO TABS: 5.0000 mg | ORAL_TABLET | ORAL | 0 refills | Status: AC | PRN

## 2018-04-14 MED ORDER — WARFARIN SODIUM 1 MG PO TABS: 1.0000 mg | ORAL_TABLET | Freq: Once | ORAL | Status: DC

## 2018-04-14 NOTE — Clinical Social Work Placement (Signed)
   CLINICAL SOCIAL WORK PLACEMENT  NOTE  Date:  04/14/2018  Patient Details  Name: Albert [name] MRN: [id number] Date of Birth: [birth year]  Clinical Social Work is seeking post-discharge placement for this patient at the [place] level of care (*CSW will initial, date and re-position this form in  chart as items are completed):  Yes   Patient/family provided with [facility] Work Department's list of facilities offering this level of care within the geographic area requested by the patient (or if unable, by the patient's family).  Yes   Patient/family informed of their freedom to choose among providers that offer the needed level of care, that participate in Medicare, Medicaid or managed care program needed by the patient, have an available bed and are willing to accept the patient.  Yes   Patient/family informed of [facility]'s ownership interest in [facility] and [facility], as well as of the fact that they are under no obligation to receive care at these facilities.  PASRR submitted to EDS on       PASRR number received on 04/11/18     Existing PASRR number confirmed on       FL2 transmitted to all facilities in geographic area requested by pt/family on       FL2 transmitted to all facilities within larger geographic area on       Patient informed that his/her managed care company has contracts with or will negotiate with certain facilities, including the following:        Yes   Patient/family informed of bed offers received.  Patient chooses bed at [facility])     Physician recommends and patient chooses bed at      Patient to be transferred to [facility]) on 68/12/75.  Patient to be transferred to facility by [facility]     Patient family notified on 04/14/18 of transfer.  Name of family member notified:  Juliann Pulse sister      PHYSICIAN       Additional Comment:     _______________________________________________ Vinie Sill, [place] 04/14/2018, 3:07 PM

## 2018-04-14 NOTE — Progress Notes (Signed)
ANTICOAGULATION CONSULT NOTE - Follow Up Consult  Pharmacy Consult for Heparin and Coumadin Indication: atrial fibrillation  Allergies  Allergen Reactions  . Metformin And Related Other (See Comments)    Diarrhea, bloody stool, upset stomach.     Patient Measurements: Height: 6\' 2"  (188 cm) Weight: 193 lb 5.5 oz (87.7 kg) IBW/kg (Calculated) : 82.2 Heparin Dosing Weight: 88 kg  Vital Signs: Temp: 98.1 F (36.7 C) (02/21 0836) Temp Source: Oral (02/21 0836) BP: 101/63 (02/21 0836) Pulse Rate: 64 (02/21 0405)  Labs: Recent Labs    04/12/18 0043  04/12/18 0235 04/12/18 1008 04/13/18 0741 04/13/18 0742 04/13/18 1209 04/14/18 0310  HGB  --    < > 8.3*  --   --  9.0*  --  9.1*  HCT  --   --  26.7*  --   --  29.7*  --  30.2*  PLT  --   --  207  --   --  244  --  217  LABPROT  --   --  21.6*  --   --   --  27.1* 26.0*  INR  --   --  1.91  --   --   --  2.56 2.42  HEPARINUNFRC 0.15*  --   --  0.32  --   --  0.32 0.62  CREATININE 9.72*  --   --   --  8.54*  --   --   --    < > = values in this interval not displayed.   Assessment:  63 year old male on Coumadin prior to admission for Afib, which has been on for L BKA 2/15 and R BKA on 2/18. On heparin bridge, which was resumed 2/18 pm.  Coumadin to resume today.       Heparin level therapeutic (0.62) on 1500 units/hr.  INR 2.42, therapeutic.     INR 1.91 on 2/19 and up to 2.56 on 2/20 despite no Coumadin since 03/31/18. INR was 3.10 on 2/13 and Vitamin K 5 mg PO was given > INR 1.59 on 2/14. No additional INRs until 2/19. Amiodarone added 2/14, which may have contributed to increased INR despite no Coumadin doses for >1 week.    Coumadin regimen PTA:  2.5 mg daily.  INR 3.79 on admit 03/20/18.  Goal of Therapy:  INR 2-3 Heparin level 0.3-0.7 units/ml Monitor platelets by anticoagulation protocol: Yes   Plan:   Stop IV heparin, confirmed with Dr. Cruzita Lederer  Coumadin 1 mg x 1 today.  Daily PT/INR and CBC.  Amiodarone 400  mg BID x 5 days completed, decreased to 200 mg daily today.  Expect discharge Coumadin dose to be lower that PTA dose.  Arty Baumgartner, [place] Pager: [phone] or phone: [phone] 04/14/2018,10:35 AM

## 2018-04-14 NOTE — Progress Notes (Addendum)
Progress Note  Patient Name: Albert [name] Date of Encounter: 04/14/2018  Primary Cardiologist: Rozann Lesches, MD  Subjective   Feeling much better. Denies any CP or SOB. Catching up on rest. Baseline OP HR appears 95-115 which we have achieved.  Inpatient Medications    Scheduled Meds: . allopurinol  100 mg Oral Daily  . amiodarone  200 mg Oral Daily  . aspirin EC  81 mg Oral Daily  . Chlorhexidine Gluconate Cloth  6 each Topical Q0600  . cinacalcet  30 mg Oral Q supper  . darbepoetin (ARANESP) injection - DIALYSIS  100 mcg Intravenous Q Sat-HD  . diltiazem  180 mg Oral Daily  . doxercalciferol  3.5 mcg Intravenous Q T,Th,Sa-HD  . insulin aspart  0-9 Units Subcutaneous TID WC  . metoprolol tartrate  50 mg Oral BID  . midodrine  10 mg Oral TID WC  . pantoprazole  40 mg Oral Daily  . pravastatin  5 mg Oral QPM  . senna-docusate  1 tablet Oral BID  . sevelamer carbonate  1,600 mg Oral With snacks  . sevelamer carbonate  3,200 mg Oral TID WC  . sodium chloride flush  3 mL Intravenous Q12H  . warfarin  1 mg Oral ONCE-1800  . Warfarin - Pharmacist Dosing Inpatient   Does not apply q1800   Continuous Infusions: . sodium chloride     PRN Meds: sodium chloride, acetaminophen, alum & mag hydroxide-simeth, bisacodyl, calcium carbonate, cyclobenzaprine, hydrALAZINE, labetalol, morphine injection, ondansetron (ZOFRAN) IV, oxyCODONE, phenol, polyethylene glycol, sodium chloride flush   Vital Signs    Vitals:   04/14/18 0447 04/14/18 0835 04/14/18 0836 04/14/18 1149  BP:  101/63 101/63 104/62  Pulse:    93  Resp: 20 18 16    Temp:   98.1 F (36.7 C) 97.7 F (36.5 C)  TempSrc:   Oral Oral  SpO2:    100%  Weight: 87.7 kg     Height:        Intake/Output Summary (Last 24 hours) at 04/14/2018 1224 Last data filed at 04/14/2018 0400 Gross per 24 hour  Intake 553.73 ml  Output -  Net 553.73 ml   Last 3 Weights 04/14/2018 04/13/2018 04/13/2018  Weight (lbs) 193 lb 5.5  oz 194 lb 7.1 oz 195 lb 8.8 oz  Weight (kg) 87.7 kg 88.2 kg 88.7 kg     Telemetry    Atrial fib HR 80s-100 - Personally Reviewed  Physical Exam   GEN: No acute distress, chronically ill appearing HEENT: Normocephalic, atraumatic, sclera non-icteric. Neck: No JVD or bruits. Cardiac: Irregularly irregular, no murmurs, rubs, or gallops.  Radials/DP/PT 1+ and equal bilaterally.  Respiratory: Clear to auscultation bilaterally. Breathing is unlabored. GI: Soft, nontender, non-distended, BS +x 4. MS: no deformity. Extremities: s/p bilateral BKAs Neuro:  AAOx3. Follows commands. Psych:  Responds to questions appropriately with a normal affect.  Labs    Chemistry Recent Labs  Lab 04/07/18 2316  04/10/18 0344 04/11/18 0847 04/12/18 0043 04/13/18 0741  NA 133*   < > 136 132* 134* 136  K 4.3   < > 4.6 5.1 5.6* 4.9  CL 93*   < > 94*  --  95* 95*  CO2 24   < > 24  --  21* 19*  GLUCOSE 185*   < > 159* 136* 174* 169*  BUN 43*   < > 38*  --  64* 56*  CREATININE 7.98*   < > 6.72*  --  9.72* 8.54*  CALCIUM 8.5*   < > 8.4*  --  8.1* 8.6*  ALBUMIN 1.7*  --   --   --  1.6* 1.7*  GFRNONAA 6*   < > 8*  --  5* 6*  GFRAA 7*   < > 9*  --  6* 7*  ANIONGAP 16*   < > 18*  --  18* 22*   < > = values in this interval not displayed.     Hematology Recent Labs  Lab 04/12/18 0235 04/13/18 0742 04/14/18 0310  WBC 19.7* 19.7* 17.8*  RBC 3.04* 3.29* 3.31*  HGB 8.3* 9.0* 9.1*  HCT 26.7* 29.7* 30.2*  MCV 87.8 90.3 91.2  MCH 27.3 27.4 27.5  MCHC 31.1 30.3 30.1  RDW 15.6* 16.2* 16.3*  PLT 207 244 217    Radiology    No results found.  Patient Profile     Albert [name] is a 63 y/o M with chronic coarse atrial fib, prior presumed NICM (EF 25% remotely, improved to 50-55% with prior nonischemic nuc), ESRD on HD, HTN with history of Hypotension, HLD, DM, PVD. Admitted with /ischemic appearing toes on right foot. He initially had several transmetatarsal amputations, however due to persistent  wound healing eventually having a bilateral BKAs. Cardiology followed for rapid AF. Amiodarone eventually added for rate control and metoprolol changed to diltiazem.  Assessment & Plan    1. Chronic AF - HR acceptable. Tachycardia was likely driven by acute issues the same way a patient with NSR would present with sinus tach under similar circumstances. Continue current regimen of diltiazem, metoprolol and amiodarone at discharge. TSH this admission was normal. I've ordered add-on baseline LFTS. Further monitoring of organ systems while on amiodarone will be at discretion of primary cardiologist. If HR continues to improve as he heals, might be able to consider discontinuation. Outpatient DCCV was mentioned in previous notes but the patient appears to have been in Afib in all EKGs in Epic back to 2012 so this would likely prove futile.   2. PAD - he is now on ASA in addition to Coumadin. He is not on aspirin for chronic cardiac reasons, so will defer to vascular team/IM on this issue - has chronic anemia so this will need to be followed closely.  3. Hypotension - improved.  [facility] will sign off.   Medication Recommendations: as above Other recommendations (labs, testing, etc): patient is reported to be going to SNF, so recommend INR checks at facility - please let our team know if we need any help arranging [phone]) Follow up as an outpatient: arranged 05/03/18 with APP in [place] - appointment will autopopulate AVS and also included reminder  For questions or updates, please contact [facility] Please consult [email or web address] for contact info under Cardiology/STEMI.  Signed, Charlie Pitter, PA-C 04/14/2018, 12:24 PM    I have personally seen and examined this patient with Melina Copa, PA-C. I agree with the assessment and plan as outlined above. Heart rate at baseline. No changes in meds today. We will sign off.   Lauree Chandler 04/14/2018 12:54 PM

## 2018-04-14 NOTE — Discharge Summary (Signed)
Physician Discharge Summary  Albert [name] DOB: 06-Feb-1956 DOA: 03/20/2018  PCP: Redmond School, MD  Admit date: 03/20/2018 Discharge date: 04/14/2018  Admitted From: home Disposition:  SNF  Recommendations for Outpatient Follow-up:  1. Follow up with PCP in 1-2 weeks 2. Please obtain BMP/CBC in one week  Home Health: none Equipment/Devices: none  Discharge Condition: stable CODE STATUS: Full code Diet recommendation: renal   HPI: Per admitting MD, Albert [name] is a 63 y.o. male with medical history significant for ESRD on HD, paroxysmal atrial fibrillation chronically anticoagulated on warfarin, type 2 diabetes mellitus complicated by peripheral polyneuropathy, who is admitted to [facility] on 03/20/2018 with cellulitis of the left lower extremity after presenting from home to the [facility] emergency department complaining of left heel discomfort. The patient reports 5 to 6 days of progressive tenderness associated with the left heel and associated with erythema extending into the medial aspect of the left foot.  He also notes mild associated swelling over that timeframe.  Denies any recent trauma involving the left lower extremity.  Left lower extremity discomfort worsens with attempts to ambulate as well as when laying supine, inducing contact of the left heel with the bed. Denies any associated subjective fever, chills, or rigors. No recent abx use.  While now not a component of his chief complaint, further discussions with the patient regarding the status of his bilateral feet reveals that the patient has been experiencing a dark/black coloration associated with the right second toe over the course of the last month in the absence of any preceding trauma.  He denies any associated discomfort with the right second toe, but acknowledges concomitant peripheral polyneuropathy implicating the bilateral feet.  Denies any associated drainage.  The  patient also acknowledges dark/black coloration involving the distal aspect of the left second toe over a similar timeframe, also in the absence of any preceding trauma. Left second toe is also not associate with any discomfort or drainage.  The patient reports that he was scheduled to establish with outpatient wound clinic, with first appointment scheduled for this Friday, 03/24/18. Past medical history is also notable for end-stage renal disease, with patient reporting that he has been on hemodialysis over the course of the last 5 years.  He reports a Tuesday, Thursday, Saturday HD schedule, and confirms that his last HD occurred on Saturday, 03/18/18.  Denies any shortness of breath at this time.   Hospital Course: Principal Problem Cellulitis of the left lower extremity, bilateral dry gangrenous toes, severe peripheral vascular disease -patient was admitted to the hospital and vascular surgery was consulted.  He initially had transmetatarsal amputation however due to persistent wound and foul-smelling eventually ended up having bilateral BKA's on 2/15 on the left and 2/18 on the right.  He recovered well postop, afebrile, he did have likely reactive leukocytosis which is improving.  He has been monitored off antibiotics.  And has remained stable.  He will need follow-up with vascular surgery as an outpatient Active Problems Persistent atrial fibrillation -due to occasional RVR cardiology was consulted and followed patient while hospitalized.  Now rate controlled on amiodarone, metoprolol.  He is anticoagulated with Coumadin Hypotension with underlying essential hypertension -Stable blood pressure, continue midodrine but also continue rate controlling agents.  Pressure is remained stable and he is tolerating hemodialysis End-stage renal disease -Nephrology following, underwent HD last on 2/20, he is continue HD as an outpatient Anemia of chronic renal disease -Hemoglobin is overall stable, no  evidence of  bleeding Type 2 diabetes mellitus -continue home medications on discharge  Discharge Diagnoses:  Principal Problem:   Cellulitis of left lower extremity Active Problems:   Essential hypertension, benign   Atrial fibrillation (HCC)   ESRD (end stage renal disease) (HCC)   Supratherapeutic INR   Necrosis of toe (HCC)   PVD (peripheral vascular disease) ([place])   ESRD on dialysis ([place])   Pressure injury of skin   Hemodialysis-associated hypotension     Discharge Instructions   Allergies as of 04/14/2018      Reactions   Metformin And Related Other (See Comments)   Diarrhea, bloody stool, upset stomach.      Medication List    STOP taking these medications   carvedilol 25 MG tablet Commonly known as:  COREG     TAKE these medications   allopurinol 100 MG tablet Commonly known as:  ZYLOPRIM Take 100 mg by mouth daily.   amiodarone 200 MG tablet Commonly known as:  PACERONE Take 1 tablet (200 mg total) by mouth daily. Start taking on:  April 15, 2018   aspirin 81 MG EC tablet Take 1 tablet (81 mg total) by mouth daily. Start taking on:  April 15, 2018   BENEFIBER DRINK MIX PO Take 1 Dose by mouth daily. 1 tbsp   blood glucose meter kit and supplies Dispense based on patient and insurance preference. Use up to four times daily as directed. (FOR ICD-9 250.00, 250.01).   cinacalcet 30 MG tablet Commonly known as:  SENSIPAR Take 30 mg by mouth every evening.   diltiazem 180 MG 24 hr capsule Commonly known as:  CARDIZEM CD TAKE 1 CAPSULE EVERY DAY What changed:  when to take this   docusate sodium 100 MG capsule Commonly known as:  COLACE Take 100 mg by mouth daily.   fenofibrate 160 MG tablet Take 160 mg by mouth at bedtime.   fish oil-omega-3 fatty acids 1000 MG capsule Take 1 g by mouth 3 (three) times daily.   gabapentin 300 MG capsule Commonly known as:  NEURONTIN Take 300 mg by mouth 3 (three) times daily.   glipiZIDE 5 MG 24 hr  tablet Commonly known as:  GLUCOTROL XL Take 5 mg by mouth every morning.   Insulin Pen Needle 31G X 5 MM Misc Use as directed   metoprolol tartrate 50 MG tablet Commonly known as:  LOPRESSOR Take 1 tablet (50 mg total) by mouth 2 (two) times daily.   midodrine 10 MG tablet Commonly known as:  PROAMATINE Take 1 tablet (10 mg total) by mouth 3 (three) times daily with meals.   oxyCODONE 5 MG immediate release tablet Commonly known as:  Oxy IR/ROXICODONE Take 1-2 tablets (5-10 mg total) by mouth every 4 (four) hours as needed for severe pain.   pantoprazole 40 MG tablet Commonly known as:  PROTONIX Take 1 tablet (40 mg total) by mouth daily. What changed:  when to take this   pravastatin 10 MG tablet Commonly known as:  PRAVACHOL Take 5 mg by mouth at bedtime.   sevelamer carbonate 800 MG tablet Commonly known as:  RENVELA Take 1,600-3,200 mg by mouth See admin instructions. Take 3200 mg after each meal and 1600 mg each snack   TRADJENTA 5 MG Tabs tablet Generic drug:  linagliptin Take 5 mg by mouth every morning.   warfarin 2.5 MG tablet Commonly known as:  COUMADIN Take as directed. If you are unsure how to take this medication, talk to your  nurse or doctor. Original instructions:  TAKE 1 TABLET EVERY DAY EXCEPT TAKE  1/2 TABLET ON SUNDAYS, [place] OR AS DIRECTED What changed:    how much to take  how to take this  when to take this  additional instructions      Follow-up Information    Waynetta Sandy, MD In 4 weeks.   Specialties:  Vascular Surgery, Cardiology Why:  Office will call you to arrange your appt (sent) Contact information: [place] [address] [place], [place], PA-C Follow up.   Specialties:  Physician Assistant, Cardiology Why:  [place] office within [facility] - 05/03/18 at 2:30pm. Arrive 15-20 minutes early to check in. Bernerd Pho is one of  the PAs that works closely with Dr. Domenic Polite. Contact information: [place] [address] [phone]           Consultations:  Cardiology   Vascular surgery   Procedures/Studies: 03/27/18 Arteriogram with balloon angioplasty of the left posterior tibial artery Aorta and iliac segments are heavily calcified and tortuous however there is no flow-limiting stenosis. SFAs are heavily calcified no flow-limiting stenosis. Right side appears to have runoff via the posterior tibial and anterior tibial arteries without flow-limiting stenosis. Left side has runoff via the anterior tibial and peroneal posterior tibial occludes above the ankle. After intervention there is less than 30% residual stenosis and a strong signal at the ankle.Patient will need amputation of bilateral second toes at least and we will watch the ulceration of his heel on the left but he remains high risk for proximal leg amputations bilaterally.  03/29/18 1. left transmetatarsal amputation second toe. 2. Large amount of necrotic debris and gangrenous tissue toes 1 2 and 3 right foot requiring Transmetatarsalamputation of these rather than isolated second toe amputation.  04/08/18 LT BKA  04/11/18 Rt BKA  Dg Ankle 2 Views Left  Result Date: 03/20/2018 CLINICAL DATA:  Pain. EXAM: LEFT ANKLE - 2 VIEW COMPARISON:  None. FINDINGS: There is no evidence of fracture, dislocation, or joint effusion. There is no evidence of arthropathy or other focal bone abnormality. Soft tissues are unremarkable. IMPRESSION: Negative. Electronically Signed   By: Misty Stanley M.D.   On: 03/20/2018 20:14   US Arterial Abi (screening Lower Extremity)  Result Date: 03/21/2018 CLINICAL DATA:  Gangrene in toes bilaterally EXAM: NONINVASIVE PHYSIOLOGIC VASCULAR STUDY OF BILATERAL LOWER EXTREMITIES TECHNIQUE: Evaluation of both lower extremities were performed at rest, including calculation of ankle-brachial indices with single level  Doppler, pressure and pulse volume recording. COMPARISON:  None. FINDINGS: Right ABI:  Noncompressible vessels at the ankle. Left ABI:  Noncompressible vessels at the ankle. Right Lower Extremity: Monophasic posterior tibial and dorsalis pedis waveforms. Left Lower Extremity: The posterior tibial Doppler waveform is absent. The dorsalis pedis waveform is monophasic. IMPRESSION: There is severe bilateral lower extremity arterial occlusive disease. Electronically Signed   By: Marybelle Killings M.D.   On: 03/21/2018 16:27   Dg Foot Complete Left  Result Date: 03/20/2018 CLINICAL DATA:  Soft tissue wound at second toe and posterior heel. EXAM: LEFT FOOT - COMPLETE 3+ VIEW COMPARISON:  None. FINDINGS: No evidence for an acute fracture. No subluxation or dislocation. No bony destruction evident in the second toe or calcaneus to suggest overt osteomyelitis. No gas evident within the soft tissues. IMPRESSION: Negative. Electronically Signed   By: Misty Stanley M.D.   On: 03/20/2018 20:13  Dg Foot Complete Right  Result Date: 03/20/2018 CLINICAL DATA:  Left foot pain EXAM: RIGHT FOOT COMPLETE - 3+ VIEW COMPARISON:  None. FINDINGS: Severe diffuse vascular calcifications. Plantar calcaneal spur. No acute bony abnormality. Specifically, no fracture, subluxation, or dislocation. IMPRESSION: No acute bony abnormality. Electronically Signed   By: Rolm Baptise M.D.   On: 03/20/2018 20:13   Vas [place] Lower Extremity Saphenous Vein Mapping  Result Date: 03/24/2018 LOWER EXTREMITY VEIN MAPPING History: History of PAD; patient is pre-operative for lower extremity bypass          graft.  Performing Technologist: June Leap RDMS, RVT  Examination Guidelines: A complete evaluation includes B-mode imaging, spectral Doppler, color Doppler, and power Doppler as needed of all accessible portions of each vessel. Bilateral testing is considered an integral part of a complete examination. Limited examinations for reoccurring indications  may be performed as noted. +---------------+-----------+----------------------+---------------+-----------+   RT Diameter  RT Findings         GSV            LT Diameter  LT Findings      (cm)                                            (cm)                  +---------------+-----------+----------------------+---------------+-----------+      0.62                     Saphenofemoral         0.42                                                   Junction                                  +---------------+-----------+----------------------+---------------+-----------+      0.35                     Proximal thigh         0.38                  +---------------+-----------+----------------------+---------------+-----------+      0.30                       Mid thigh            0.35                  +---------------+-----------+----------------------+---------------+-----------+      0.28                      Distal thigh          0.40                  +---------------+-----------+----------------------+---------------+-----------+      0.41       branching          Knee              0.27                  +---------------+-----------+----------------------+---------------+-----------+  0.34                       Prox calf            0.28                  +---------------+-----------+----------------------+---------------+-----------+      0.30       branching        Mid calf            0.27       branching  +---------------+-----------+----------------------+---------------+-----------+      0.23                      Distal calf           0.36                  +---------------+-----------+----------------------+---------------+-----------+ Diagnosing physician: Curt Jews MD Electronically signed by Curt Jews MD on 03/24/2018 at 4:53:39 PM.    Final      Subjective: - no chest pain, shortness of breath, no abdominal pain, nausea or vomiting.    Discharge Exam: Vitals:   04/14/18 0836 04/14/18 1149  BP: 101/63 104/62  Pulse:  93  Resp: 16   Temp: 98.1 F (36.7 C) 97.7 F (36.5 C)  SpO2:  100%    General: Pt is alert, awake, not in acute distress Cardiovascular: RRR, S1/S2 +, no rubs, no gallops Respiratory: CTA bilaterally, no wheezing, no rhonchi Abdominal: Soft, NT, ND, bowel sounds + Extremities: no edema, no cyanosis    The results of significant diagnostics from this hospitalization (including imaging, microbiology, ancillary and laboratory) are listed below for reference.     Microbiology: No results found for this or any previous visit (from the past 240 hour(s)).   Labs: BNP (last 3 results) No results for input(s): BNP in the last 8760 hours. Basic Metabolic Panel: Recent Labs  Lab 04/07/18 2316 04/09/18 0406 04/10/18 0344 04/11/18 0847 04/12/18 0043 04/13/18 0741  NA 133* 136 136 132* 134* 136  K 4.3 4.2 4.6 5.1 5.6* 4.9  CL 93* 96* 94*  --  95* 95*  CO2 '24 24 24  ' --  21* 19*  GLUCOSE 185* 208* 159* 136* 174* 169*  BUN 43* 23 38*  --  64* 56*  CREATININE 7.98* 5.00* 6.72*  --  9.72* 8.54*  CALCIUM 8.5* 8.3* 8.4*  --  8.1* 8.6*  PHOS 4.5  --   --   --  6.2* 6.2*   Liver Function Tests: Recent Labs  Lab 04/07/18 2316 04/12/18 0043 04/13/18 0741 04/14/18 1325  AST  --   --   --  144*  ALT  --   --   --  15  ALKPHOS  --   --   --  73  BILITOT  --   --   --  1.1  PROT  --   --   --  6.4*  ALBUMIN 1.7* 1.6* 1.7* 1.9*   No results for input(s): LIPASE, AMYLASE in the last 168 hours. No results for input(s): AMMONIA in the last 168 hours. CBC: Recent Labs  Lab 04/10/18 0344 04/11/18 0240 04/11/18 0847 04/12/18 0235 04/13/18 0742 04/14/18 0310  WBC 15.8* 15.3*  --  19.7* 19.7* 17.8*  HGB 9.5* 9.3* 10.2* 8.3* 9.0* 9.1*  HCT 31.5* 30.1* 30.0* 26.7* 29.7* 30.2*  MCV 89.2 87.8  --  87.8 90.3 91.2  PLT 224 233  --  207 244 217   Cardiac Enzymes: No results for input(s): CKTOTAL,  CKMB, CKMBINDEX, TROPONINI in the last 168 hours. BNP: Invalid input(s): POCBNP CBG: Recent Labs  Lab 04/13/18 1208 04/13/18 1642 04/13/18 2122 04/14/18 0615 04/14/18 1145  GLUCAP 122* 133* 145* 165* 156*   D-Dimer No results for input(s): DDIMER in the last 72 hours. Hgb A1c No results for input(s): HGBA1C in the last 72 hours. Lipid Profile No results for input(s): CHOL, HDL, LDLCALC, TRIG, CHOLHDL, LDLDIRECT in the last 72 hours. Thyroid function studies No results for input(s): TSH, T4TOTAL, T3FREE, THYROIDAB in the last 72 hours.  Invalid input(s): FREET3 Anemia work up No results for input(s): VITAMINB12, FOLATE, FERRITIN, TIBC, IRON, RETICCTPCT in the last 72 hours. Urinalysis    Component Value Date/Time   COLORURINE YELLOW 10/12/2013 1045   APPEARANCEUR CLEAR 10/12/2013 1045   LABSPEC 1.015 10/12/2013 1045   PHURINE 8.0 10/12/2013 1045   GLUCOSEU NEGATIVE 10/12/2013 1045   HGBUR MODERATE (A) 10/12/2013 1045   BILIRUBINUR NEGATIVE 10/12/2013 1045   KETONESUR NEGATIVE 10/12/2013 1045   PROTEINUR 30 (A) 10/12/2013 1045   UROBILINOGEN 1.0 10/12/2013 1045   NITRITE NEGATIVE 10/12/2013 1045   LEUKOCYTESUR NEGATIVE 10/12/2013 1045   Sepsis Labs Invalid input(s): PROCALCITONIN,  WBC,  LACTICIDVEN   Time coordinating discharge: 40 minutes  SIGNED:  Marzetta Board, MD  [facility] 04/14/2018, 2:41 PM

## 2018-04-14 NOTE — Progress Notes (Signed)
Patient will DC to: [facility] [place] Date: 04/14/2018 Family Notified: Ophelia, sister  Transport By: Corey Harold   RN, patient, and facility notified of DC. Discharge Summary sent to facility. RN given number for [phone],Room A3, bed 2.  Ambulance transport requested for patient.   Clinical Social Worker signing off. Thurmond Butts, [place] Social Worker [phone]

## 2018-04-14 NOTE — Progress Notes (Addendum)
  Progress Note    04/14/2018 8:44 AM 3 Days Post-Op  Subjective:  Says his pain is better  Afebrile  Vitals:   04/14/18 0447 04/14/18 0836  BP:  101/63  Pulse:    Resp: 20 16  Temp:  98.1 F (36.7 C)  SpO2:      Physical Exam: Incisions:  Bilateral BKA incisons look good with staples in tact.     CBC    Component Value Date/Time   WBC 17.8 (H) 04/14/2018 0310   RBC 3.31 (L) 04/14/2018 0310   HGB 9.1 (L) 04/14/2018 0310   HCT 30.2 (L) 04/14/2018 0310   PLT 217 04/14/2018 0310   MCV 91.2 04/14/2018 0310   MCH 27.5 04/14/2018 0310   MCHC 30.1 04/14/2018 0310   RDW 16.3 (H) 04/14/2018 0310   LYMPHSABS 0.7 03/20/2018 1931   MONOABS 0.7 03/20/2018 1931   EOSABS 0.1 03/20/2018 1931   BASOSABS 0.0 03/20/2018 1931    BMET    Component Value Date/Time   NA 136 04/13/2018 0741   K 4.9 04/13/2018 0741   CL 95 (L) 04/13/2018 0741   CO2 19 (L) 04/13/2018 0741   GLUCOSE 169 (H) 04/13/2018 0741   BUN 56 (H) 04/13/2018 0741   CREATININE 8.54 (H) 04/13/2018 0741   CALCIUM 8.6 (L) 04/13/2018 0741   CALCIUM 9.0 03/21/2007 0415   GFRNONAA 6 (L) 04/13/2018 0741   GFRAA 7 (L) 04/13/2018 0741    INR    Component Value Date/Time   INR 2.42 04/14/2018 0310     Intake/Output Summary (Last 24 hours) at 04/14/2018 0844 Last data filed at 04/14/2018 0400 Gross per 24 hour  Intake 553.73 ml  Output 600 ml  Net -46.27 ml     Assessment/Plan:  63 y.o. male is s/p left below knee amputation 6 Days Post-Op and right below knee amputation   3 Days Post-Op  -pt doing well and incisions look good.  -leukocytosis improved this am -will see back in office in 4 weeks for staple removal -disposition per primary team.  -if any questions over the weekend, please call on call MD   Leontine Locket, PA-C [facility] [phone] 04/14/2018 8:44 AM   I have independently interviewed and examined patient and agree with PA assessment and plan above. Has f/u on  3/6. Leukocytosis is improving and bka sites healing well.  Brandon C. Donzetta Matters, MD [facility] [place] Office: [phone] Pager: [phone]

## 2018-04-14 NOTE — Care Management Note (Signed)
Case Management Note Marvetta Gibbons RN, BSN Transitions of Care Unit 4E- RN Case Manager [phone]  Patient Details  Name: Albert [name] MRN: [id number] Date of Birth: 12-05-1955  Subjective/Objective:   Pt admitted with cellulitis, s/p TMA on left and right, post op non healing wound sites, s/p left BKA on 2/15 and right BKA on 2/18                 Action/Plan: PTA pt lived at home, CIR initially consulted, however post bil BKA pt more appropriate for SNF placement, CSW consulted for SNF placement needs.   Expected Discharge Date:  04/14/18               Expected Discharge Plan:  [place]  In-House Referral:  Clinical Social Work  Discharge planning Services  CM Consult  Post Acute Care Choice:  IP Rehab Choice offered to:  NA  DME Arranged:    DME Agency:     HH Arranged:    [place] Agency:     Status of Service:  Completed, signed off  If discussed at [employer] of Stay Meetings, dates discussed:    Discharge Disposition: skilled facility   Additional Comments:  Dawayne Patricia, RN 04/14/2018, 2:56 PM

## 2018-04-14 NOTE — Progress Notes (Deleted)
PROGRESS NOTE  Albert [name] ZOX:[id number] DOB: [birth year] DOA: 03/20/2018 PCP: Redmond School, MD   LOS: 25 days   Brief Narrative / Interim history: 63 year old male with history of chronic A. fib on Coumadin, type 2 diabetes mellitus, end-stage renal disease on TTS HD was admitted to the hospital on 03/20/2018 with left lower extremity cellulitis in the setting of peripheral vascular disease.  This is a recurrent problem.  He was initially admitted any [facility] and then due to abnormal ABI he was transferred to [facility] for vascular surgery consultation.  He initially had several transmetatarsal amputations, however due to persistent wound healing eventually having a left BKA on 2/15 and a right BKA on 2/18  Subjective: -No complaints this morning, no chest pain, no shortness of breath.  Awaiting placement  Assessment & Plan: Principal Problem:   Cellulitis of left lower extremity Active Problems:   Essential hypertension, benign   Atrial fibrillation (HCC)   ESRD (end stage renal disease) (HCC)   Supratherapeutic INR   Necrosis of toe (HCC)   PVD (peripheral vascular disease) (HCC)   ESRD on dialysis ([place])   Pressure injury of skin   Hemodialysis-associated hypotension   Principal Problem Cellulitis of the left lower extremity, bilateral dry gangrenous toes, severe peripheral vascular disease -patient was admitted to the hospital and vascular surgery was consulted.  He initially had transmetatarsal amputation however due to persistent wound and foul-smelling eventually ended up having bilateral BKA's on 2/15 on the left and 2/18 on the right.  He recovered well postop, afebrile, he did have likely reactive leukocytosis which is improving.  He has been monitored off antibiotics.  And has remained stable.  He will need follow-up with vascular surgery as an outpatient Active Problems Persistent atrial fibrillation -due to occasional RVR cardiology was consulted and followed  patient while hospitalized.  Now rate controlled on amiodarone, metoprolol.  He is anticoagulated with Coumadin Hypotension with underlying essential hypertension -Stable blood pressure, continue midodrine but also continue rate controlling agents.  Pressure is remained stable and he is tolerating hemodialysis End-stage renal disease -Nephrology following, underwent HD last on 2/20, he is continue HD as an outpatient Anemia of chronic renal disease -Hemoglobin is overall stable, no evidence of bleeding Type 2 diabetes mellitus -medications on discharge   Scheduled Meds: . allopurinol  100 mg Oral Daily  . amiodarone  200 mg Oral Daily  . aspirin EC  81 mg Oral Daily  . Chlorhexidine Gluconate Cloth  6 each Topical Q0600  . cinacalcet  30 mg Oral Q supper  . darbepoetin (ARANESP) injection - DIALYSIS  100 mcg Intravenous Q Sat-HD  . diltiazem  180 mg Oral Daily  . doxercalciferol  3.5 mcg Intravenous Q T,Th,Sa-HD  . insulin aspart  0-9 Units Subcutaneous TID WC  . metoprolol tartrate  50 mg Oral BID  . midodrine  10 mg Oral TID WC  . pantoprazole  40 mg Oral Daily  . pravastatin  5 mg Oral QPM  . senna-docusate  1 tablet Oral BID  . sevelamer carbonate  1,600 mg Oral With snacks  . sevelamer carbonate  3,200 mg Oral TID WC  . sodium chloride flush  3 mL Intravenous Q12H  . warfarin  1 mg Oral ONCE-1800  . Warfarin - Pharmacist Dosing Inpatient   Does not apply q1800   Continuous Infusions: . sodium chloride     PRN Meds:.sodium chloride, acetaminophen, alum & mag hydroxide-simeth, bisacodyl, calcium carbonate, cyclobenzaprine, hydrALAZINE, labetalol, morphine injection,  ondansetron (ZOFRAN) IV, oxyCODONE, phenol, polyethylene glycol, sodium chloride flush  DVT prophylaxis: Heparin Code Status: Full code Family Communication: No family present at bedside Disposition Plan: SNF, awaiting insurance authorization  Consultants:   Neurology  Cardiology  Vascular  surgery  Procedures:  03/27/18 Arteriogram with balloon angioplasty of the left posterior tibial artery Aorta and iliac segments are heavily calcified and tortuous however there is no flow-limiting stenosis. SFAs are heavily calcified no flow-limiting stenosis. Right side appears to have runoff via the posterior tibial and anterior tibial arteries without flow-limiting stenosis. Left side has runoff via the anterior tibial and peroneal posterior tibial occludes above the ankle. After intervention there is less than 30% residual stenosis and a strong signal at the ankle.Patient will need amputation of bilateral second toes at least and we will watch the ulceration of his heel on the left but he remains high risk for proximal leg amputations bilaterally.  03/29/18 1. left transmetatarsal amputation second toe. 2. Large amount of necrotic debris and gangrenous tissue toes 1 2 and 3 right foot requiring Transmetatarsal amputation of these rather than isolated second toe amputation.  04/08/18 LT BKA  04/11/18 Rt BKA  Antimicrobials:  Completed Ancef    Objective: Vitals:   04/13/18 1925 04/14/18 0405 04/14/18 0447 04/14/18 0836  BP: 129/62 95/65  101/63  Pulse:  64    Resp: 19 18 20 16   Temp: 98.4 F (36.9 C) 97.9 F (36.6 C)  98.1 F (36.7 C)  TempSrc: Axillary Oral  Oral  SpO2: 96% 91%    Weight:   87.7 kg   Height:        Intake/Output Summary (Last 24 hours) at 04/14/2018 1127 Last data filed at 04/14/2018 0400 Gross per 24 hour  Intake 553.73 ml  Output -  Net 553.73 ml   Filed Weights   04/13/18 0702 04/13/18 1042 04/14/18 0447  Weight: 88.7 kg 88.2 kg 87.7 kg    Examination:  Constitutional: NAD Respiratory: CTA Cardiovascular: RRR  Data Reviewed: I have independently reviewed following labs and imaging studies   CBC: Recent Labs  Lab 04/10/18 0344 04/11/18 0240 04/11/18 0847 04/12/18 0235 04/13/18 0742 04/14/18 0310  WBC 15.8* 15.3*  --  19.7* 19.7*  17.8*  HGB 9.5* 9.3* 10.2* 8.3* 9.0* 9.1*  HCT 31.5* 30.1* 30.0* 26.7* 29.7* 30.2*  MCV 89.2 87.8  --  87.8 90.3 91.2  PLT 224 233  --  207 244 384   Basic Metabolic Panel: Recent Labs  Lab 04/07/18 2316 04/09/18 0406 04/10/18 0344 04/11/18 0847 04/12/18 0043 04/13/18 0741  NA 133* 136 136 132* 134* 136  K 4.3 4.2 4.6 5.1 5.6* 4.9  CL 93* 96* 94*  --  95* 95*  CO2 24 24 24   --  21* 19*  GLUCOSE 185* 208* 159* 136* 174* 169*  BUN 43* 23 38*  --  64* 56*  CREATININE 7.98* 5.00* 6.72*  --  9.72* 8.54*  CALCIUM 8.5* 8.3* 8.4*  --  8.1* 8.6*  PHOS 4.5  --   --   --  6.2* 6.2*   GFR: Estimated Creatinine Clearance: 10.2 mL/min (A) (by C-G formula based on SCr of 8.54 mg/dL (H)). Liver Function Tests: Recent Labs  Lab 04/07/18 2316 04/12/18 0043 04/13/18 0741  ALBUMIN 1.7* 1.6* 1.7*   No results for input(s): LIPASE, AMYLASE in the last 168 hours. No results for input(s): AMMONIA in the last 168 hours. Coagulation Profile: Recent Labs  Lab 04/12/18 0235 04/13/18 1209 04/14/18  0310  INR 1.91 2.56 2.42   Cardiac Enzymes: No results for input(s): CKTOTAL, CKMB, CKMBINDEX, TROPONINI in the last 168 hours. BNP (last 3 results) No results for input(s): PROBNP in the last 8760 hours. HbA1C: No results for input(s): HGBA1C in the last 72 hours. CBG: Recent Labs  Lab 04/13/18 1148 04/13/18 1208 04/13/18 1642 04/13/18 2122 04/14/18 0615  GLUCAP 111* 122* 133* 145* 165*   Lipid Profile: No results for input(s): CHOL, HDL, LDLCALC, TRIG, CHOLHDL, LDLDIRECT in the last 72 hours. Thyroid Function Tests: No results for input(s): TSH, T4TOTAL, FREET4, T3FREE, THYROIDAB in the last 72 hours. Anemia Panel: No results for input(s): VITAMINB12, FOLATE, FERRITIN, TIBC, IRON, RETICCTPCT in the last 72 hours. Urine analysis:    Component Value Date/Time   COLORURINE YELLOW 10/12/2013 1045   APPEARANCEUR CLEAR 10/12/2013 1045   LABSPEC 1.015 10/12/2013 1045   PHURINE 8.0  10/12/2013 1045   GLUCOSEU NEGATIVE 10/12/2013 1045   HGBUR MODERATE (A) 10/12/2013 1045   BILIRUBINUR NEGATIVE 10/12/2013 1045   KETONESUR NEGATIVE 10/12/2013 1045   PROTEINUR 30 (A) 10/12/2013 1045   UROBILINOGEN 1.0 10/12/2013 1045   NITRITE NEGATIVE 10/12/2013 1045   LEUKOCYTESUR NEGATIVE 10/12/2013 1045   Sepsis Labs: Invalid input(s): PROCALCITONIN, LACTICIDVEN  No results found for this or any previous visit (from the past 240 hour(s)).    Radiology Studies: No results found.   Marzetta Board, MD, PhD [facility]  Contact via  [email or web address]  [place] P: [phone]  F: [phone]

## 2018-04-14 NOTE — Progress Notes (Signed)
[facility]  SUBJECTIVE:    HD yesterday  Afebrile, vital signs stable  Hemoglobin 9.1  NO c/o  OBJECTIVE:  Vitals:   04/14/18 0447 04/14/18 0836  BP:  101/63  Pulse:    Resp: 20 16  Temp:  98.1 F (36.7 C)  SpO2:      Intake/Output Summary (Last 24 hours) at 04/14/2018 1610 Last data filed at 04/14/2018 0400 Gross per 24 hour  Intake 553.73 ml  Output 600 ml  Net -46.27 ml    Genearl:  AAOx3 NAD HEENT: MMM [place] AT anicteric sclera CV:  Regular on exam. Nl s1s2 no rub Lungs: CTA bilaterally Abd:  abd SNT/ND with normal BS Extremities:  No LE edema. S/p L BKA + R BKA.  Bandaged and socked.  Left upper extremity AV fistula with a thrill and bruit. Skin:  No skin rash Neuro; Conversant  MEDICATIONS:  . allopurinol  100 mg Oral Daily  . amiodarone  200 mg Oral Daily  . amiodarone  400 mg Oral BID  . aspirin EC  81 mg Oral Daily  . Chlorhexidine Gluconate Cloth  6 each Topical Q0600  . cinacalcet  30 mg Oral Q supper  . darbepoetin (ARANESP) injection - DIALYSIS  100 mcg Intravenous Q Sat-HD  . diltiazem  180 mg Oral Daily  . doxercalciferol  3.5 mcg Intravenous Q T,Th,Sa-HD  . insulin aspart  0-9 Units Subcutaneous TID WC  . metoprolol tartrate  50 mg Oral BID  . midodrine  10 mg Oral TID WC  . pantoprazole  40 mg Oral Daily  . pravastatin  5 mg Oral QPM  . senna-docusate  1 tablet Oral BID  . sevelamer carbonate  1,600 mg Oral With snacks  . sevelamer carbonate  3,200 mg Oral TID WC  . sodium chloride flush  3 mL Intravenous Q12H  . Warfarin - Pharmacist Dosing Inpatient   Does not apply q1800    LABS:   CBC Latest Ref Rng & Units 04/14/2018 04/13/2018 04/12/2018  WBC 4.0 - 10.5 K/uL 17.8(H) 19.7(H) 19.7(H)  Hemoglobin 13.0 - 17.0 g/dL 9.1(L) 9.0(L) 8.3(L)  Hematocrit 39.0 - 52.0 % 30.2(L) 29.7(L) 26.7(L)  Platelets 150 - 400 K/uL 217 244 207    CMP Latest Ref Rng & Units 04/13/2018 04/12/2018 04/11/2018  Glucose 70 - 99 mg/dL 169(H) 174(H)  136(H)  BUN 8 - 23 mg/dL 56(H) 64(H) -  Creatinine 0.61 - 1.24 mg/dL 8.54(H) 9.72(H) -  Sodium 135 - 145 mmol/L 136 134(L) 132(L)  Potassium 3.5 - 5.1 mmol/L 4.9 5.6(H) 5.1  Chloride 98 - 111 mmol/L 95(L) 95(L) -  CO2 22 - 32 mmol/L 19(L) 21(L) -  Calcium 8.9 - 10.3 mg/dL 8.6(L) 8.1(L) -  Total Protein 6.5 - 8.1 g/dL - - -  Total Bilirubin 0.3 - 1.2 mg/dL - - -  Alkaline Phos 38 - 126 U/L - - -  AST 15 - 41 U/L - - -  ALT 17 - 63 U/L - - -    Lab Results  Component Value Date   PTH 350.0 (H) 03/21/2007   CALCIUM 8.6 (L) 04/13/2018   CAION 0.95 (L) 01/18/2017   PHOS 6.2 (H) 04/13/2018       Component Value Date/Time   COLORURINE YELLOW 10/12/2013 [place] 10/12/2013 1045   LABSPEC 1.015 10/12/2013 1045   PHURINE 8.0 10/12/2013 1045   GLUCOSEU NEGATIVE 10/12/2013 1045   HGBUR MODERATE (A) 10/12/2013 [place] 10/12/2013 1045  KETONESUR NEGATIVE 10/12/2013 1045   PROTEINUR 30 (A) 10/12/2013 1045   UROBILINOGEN 1.0 10/12/2013 1045   NITRITE NEGATIVE 10/12/2013 1045   LEUKOCYTESUR NEGATIVE 10/12/2013 1045      Component Value Date/Time   PHART 7.405 10/12/2013 0352   PCO2ART 32.2 (L) 10/12/2013 0352   PO2ART 111.0 (H) 10/12/2013 0352   HCO3 20.4 10/12/2013 0352   TCO2 28 01/18/2017 1346   ACIDBASEDEF 3.9 (H) 10/12/2013 0352   O2SAT 99.1 10/12/2013 0352       Component Value Date/Time   IRON 46 03/16/2007 0610   TIBC 358 03/16/2007 0610   FERRITIN 237 03/16/2007 0610   IRONPCTSAT 13 (L) 03/16/2007 0610    Hemodialysis prescription: TTS, 4 hours 15 minutes, [facility].  Dry weight 97 kg.  Left brachiocephalic fistula.   ASSESSMENT/PLAN:     1. Bilateral arterial insufficiency- with cellulitis and gangrenous changes.  S/p angio, s/p TMAs on both sides  2/5, s/p LBKA 2/15. S/p R BKA 2/18 2. ESRD continue with HD qTTS Dr.  Lowanda Foster, on HD.  Below EDW losing weight.  Poor po intake.  Will need new EDW on D/C. Cont on schedule:  4h, AVF, no heparin. 2K.   3. Anemia: On ESA.  CTM 4. CKD-MBD: continue with binders and vit D 5. Nutrition: renal diet 6. Hypertension: not a current issue, see below for a fib. 7. DM- stable 8. Afib: on cardizem and metoprolol, Coumadin per pharmacy. Cardiology involved now given tachycardia - midodrine added by cardiology to allow titration of blocking agents.   Pearson Grippe MD

## 2018-04-14 NOTE — Progress Notes (Signed)
Physical Therapy Treatment Patient Details Name: Albert [name] MRN: [id number] DOB: 12/06/1955 Today's Date: 04/14/2018    History of Present Illness Pt is a 63 y.o. M with significant PMH of ESRD, PVD, who initially presented s/p right transmetarsal amputation. Now s/p L BKA 2/15 and right BKA 2/18.    PT Comments    Pt somewhat more quiet and withdrawn this session, but remains motivated to participate in therapy with seemingly good pain control. Requiring two person moderate assistance to perform anterior to posterior transfer from bed to chair. Rest of session focused on therapeutic exercises for strengthening residual limbs. Will continue to focus on transfer training and strengthening in addition to amputee education.    Follow Up Recommendations  SNF     Equipment Recommendations  Other (comment)(defer)    Recommendations for Other Services       Precautions / Restrictions Precautions Precautions: Fall Precaution Comments: L BKA, R BKA Restrictions Weight Bearing Restrictions: Yes RLE Weight Bearing: Non weight bearing LLE Weight Bearing: Non weight bearing    Mobility  Bed Mobility Overal bed mobility: Needs Assistance             General bed mobility comments: Pt able to progress to long sitting without physical assistance. pt then used BUE's and PT used bed pad to scoot hips towards right to prepare for anterior to posterior transfer.   Transfers Overall transfer level: Needs assistance   Transfers: Comptroller transfers: Mod assist;+2 physical assistance   General transfer comment: Pt performed A-P transfer with modA + 2 from bed to chair. Cues for hand positioning and use of bed pad to pull hips back   Ambulation/Gait                 Stairs             Wheelchair Mobility    Modified Rankin (Stroke Patients Only)       Balance Overall balance assessment: Needs  assistance Sitting-balance support: Bilateral upper extremity supported Sitting balance-Leahy Scale: Fair                                      Cognition Arousal/Alertness: Awake/alert Behavior During Therapy: WFL for tasks assessed/performed Overall Cognitive Status: No family/caregiver present to determine baseline cognitive functioning                                 General Comments: pt more quiet this session, minimal verbalizations. agreeable to therapy      Exercises Amputee Exercises Quad Sets: 10 reps;Both;Seated Gluteal Sets: 10 reps;Seated Hip ABduction/ADduction: 20 reps;Both;Seated Knee Extension: 10 reps;Both;Seated    General Comments  VSS      Pertinent Vitals/Pain Pain Assessment: Faces Faces Pain Scale: Hurts a little bit Pain Location: residual limb Pain Descriptors / Indicators: Operative site guarding Pain Intervention(s): Monitored during session;Premedicated before session    Home Living                      Prior Function            PT Goals (current goals can now be found in the care plan section) Acute Rehab PT Goals Patient Stated Goal: decrease pain PT Goal Formulation: With patient Time For Goal Achievement: 04/23/18 Potential to Achieve Goals:  Good Progress towards PT goals: Progressing toward goals    Frequency    Min 2X/week      PT Plan Current plan remains appropriate    Co-evaluation              AM-PAC PT "6 Clicks" Mobility   Outcome Measure  Help needed turning from your back to your side while in a flat bed without using bedrails?: A Lot Help needed moving from lying on your back to sitting on the side of a flat bed without using bedrails?: A Lot Help needed moving to and from a bed to a chair (including a wheelchair)?: A Lot Help needed standing up from a chair using your arms (e.g., wheelchair or bedside chair)?: Total Help needed to walk in hospital room?: Total Help  needed climbing 3-5 steps with a railing? : Total 6 Click Score: 9    End of Session   Activity Tolerance: Patient tolerated treatment well Patient left: with call bell/phone within reach;in chair;with chair alarm set Nurse Communication: Mobility status PT Visit Diagnosis: Other abnormalities of gait and mobility (R26.89);Muscle weakness (generalized) (M62.81);Pain Pain - Right/Left: Right Pain - part of body: Ankle and joints of foot     Time: 0601-5615 PT Time Calculation (min) (ACUTE ONLY): 19 min  Charges:  $Therapeutic Exercise: 8-22 mins                     Ellamae Sia, PT, DPT Acute Rehabilitation Services Pager [phone] Office [phone]    Willy Eddy 04/14/2018, 9:43 AM

## 2018-04-15 DIAGNOSIS — Z992 Dependence on renal dialysis: Secondary | ICD-10-CM | POA: Diagnosis not present

## 2018-04-15 DIAGNOSIS — N186 End stage renal disease: Secondary | ICD-10-CM | POA: Diagnosis not present

## 2018-04-17 DIAGNOSIS — Z89511 Acquired absence of right leg below knee: Secondary | ICD-10-CM | POA: Diagnosis not present

## 2018-04-17 DIAGNOSIS — R0689 Other abnormalities of breathing: Secondary | ICD-10-CM | POA: Diagnosis not present

## 2018-04-17 DIAGNOSIS — E1122 Type 2 diabetes mellitus with diabetic chronic kidney disease: Secondary | ICD-10-CM | POA: Diagnosis not present

## 2018-04-17 DIAGNOSIS — Z89512 Acquired absence of left leg below knee: Secondary | ICD-10-CM | POA: Diagnosis not present

## 2018-04-18 ENCOUNTER — Emergency Department (HOSPITAL_COMMUNITY): Payer: Medicare HMO

## 2018-04-18 ENCOUNTER — Inpatient Hospital Stay (HOSPITAL_COMMUNITY)
Admission: EM | Admit: 2018-04-18 | Discharge: 2018-04-23 | DRG: 871 | Disposition: E | Payer: Medicare HMO | Source: Skilled Nursing Facility | Attending: Pulmonary Disease | Admitting: Pulmonary Disease

## 2018-04-18 ENCOUNTER — Other Ambulatory Visit: Payer: Self-pay

## 2018-04-18 ENCOUNTER — Encounter (HOSPITAL_COMMUNITY): Payer: Self-pay

## 2018-04-18 DIAGNOSIS — Z978 Presence of other specified devices: Secondary | ICD-10-CM

## 2018-04-18 DIAGNOSIS — J9811 Atelectasis: Secondary | ICD-10-CM | POA: Diagnosis not present

## 2018-04-18 DIAGNOSIS — E875 Hyperkalemia: Secondary | ICD-10-CM | POA: Diagnosis not present

## 2018-04-18 DIAGNOSIS — J189 Pneumonia, unspecified organism: Secondary | ICD-10-CM | POA: Diagnosis present

## 2018-04-18 DIAGNOSIS — E1165 Type 2 diabetes mellitus with hyperglycemia: Secondary | ICD-10-CM | POA: Diagnosis not present

## 2018-04-18 DIAGNOSIS — R4182 Altered mental status, unspecified: Secondary | ICD-10-CM

## 2018-04-18 DIAGNOSIS — I4819 Other persistent atrial fibrillation: Secondary | ICD-10-CM | POA: Diagnosis not present

## 2018-04-18 DIAGNOSIS — R0689 Other abnormalities of breathing: Secondary | ICD-10-CM

## 2018-04-18 DIAGNOSIS — T82594A Other mechanical complication of infusion catheter, initial encounter: Secondary | ICD-10-CM

## 2018-04-18 DIAGNOSIS — G9341 Metabolic encephalopathy: Secondary | ICD-10-CM | POA: Diagnosis not present

## 2018-04-18 DIAGNOSIS — D649 Anemia, unspecified: Secondary | ICD-10-CM | POA: Diagnosis present

## 2018-04-18 DIAGNOSIS — I10 Essential (primary) hypertension: Secondary | ICD-10-CM | POA: Diagnosis present

## 2018-04-18 DIAGNOSIS — I4891 Unspecified atrial fibrillation: Secondary | ICD-10-CM | POA: Diagnosis not present

## 2018-04-18 DIAGNOSIS — Z992 Dependence on renal dialysis: Secondary | ICD-10-CM

## 2018-04-18 DIAGNOSIS — E1151 Type 2 diabetes mellitus with diabetic peripheral angiopathy without gangrene: Secondary | ICD-10-CM | POA: Diagnosis present

## 2018-04-18 DIAGNOSIS — R0600 Dyspnea, unspecified: Secondary | ICD-10-CM

## 2018-04-18 DIAGNOSIS — Z515 Encounter for palliative care: Secondary | ICD-10-CM | POA: Diagnosis not present

## 2018-04-18 DIAGNOSIS — R9401 Abnormal electroencephalogram [EEG]: Secondary | ICD-10-CM | POA: Diagnosis present

## 2018-04-18 DIAGNOSIS — N186 End stage renal disease: Secondary | ICD-10-CM | POA: Diagnosis not present

## 2018-04-18 DIAGNOSIS — E7220 Disorder of urea cycle metabolism, unspecified: Secondary | ICD-10-CM | POA: Diagnosis present

## 2018-04-18 DIAGNOSIS — D631 Anemia in chronic kidney disease: Secondary | ICD-10-CM | POA: Diagnosis present

## 2018-04-18 DIAGNOSIS — R0902 Hypoxemia: Secondary | ICD-10-CM | POA: Diagnosis not present

## 2018-04-18 DIAGNOSIS — I482 Chronic atrial fibrillation, unspecified: Secondary | ICD-10-CM | POA: Diagnosis present

## 2018-04-18 DIAGNOSIS — J9602 Acute respiratory failure with hypercapnia: Secondary | ICD-10-CM | POA: Diagnosis present

## 2018-04-18 DIAGNOSIS — G4733 Obstructive sleep apnea (adult) (pediatric): Secondary | ICD-10-CM | POA: Diagnosis present

## 2018-04-18 DIAGNOSIS — E1122 Type 2 diabetes mellitus with diabetic chronic kidney disease: Secondary | ICD-10-CM | POA: Diagnosis not present

## 2018-04-18 DIAGNOSIS — Y95 Nosocomial condition: Secondary | ICD-10-CM | POA: Diagnosis not present

## 2018-04-18 DIAGNOSIS — A419 Sepsis, unspecified organism: Secondary | ICD-10-CM | POA: Diagnosis not present

## 2018-04-18 DIAGNOSIS — Z85528 Personal history of other malignant neoplasm of kidney: Secondary | ICD-10-CM

## 2018-04-18 DIAGNOSIS — E1142 Type 2 diabetes mellitus with diabetic polyneuropathy: Secondary | ICD-10-CM | POA: Diagnosis not present

## 2018-04-18 DIAGNOSIS — Z89511 Acquired absence of right leg below knee: Secondary | ICD-10-CM

## 2018-04-18 DIAGNOSIS — J9601 Acute respiratory failure with hypoxia: Secondary | ICD-10-CM | POA: Diagnosis present

## 2018-04-18 DIAGNOSIS — Z7901 Long term (current) use of anticoagulants: Secondary | ICD-10-CM

## 2018-04-18 DIAGNOSIS — Z452 Encounter for adjustment and management of vascular access device: Secondary | ICD-10-CM | POA: Diagnosis not present

## 2018-04-18 DIAGNOSIS — I953 Hypotension of hemodialysis: Secondary | ICD-10-CM | POA: Diagnosis present

## 2018-04-18 DIAGNOSIS — M109 Gout, unspecified: Secondary | ICD-10-CM | POA: Diagnosis present

## 2018-04-18 DIAGNOSIS — R404 Transient alteration of awareness: Secondary | ICD-10-CM | POA: Diagnosis not present

## 2018-04-18 DIAGNOSIS — Z8249 Family history of ischemic heart disease and other diseases of the circulatory system: Secondary | ICD-10-CM

## 2018-04-18 DIAGNOSIS — R4020 Unspecified coma: Secondary | ICD-10-CM | POA: Diagnosis not present

## 2018-04-18 DIAGNOSIS — I739 Peripheral vascular disease, unspecified: Secondary | ICD-10-CM | POA: Diagnosis not present

## 2018-04-18 DIAGNOSIS — L8990 Pressure ulcer of unspecified site, unspecified stage: Secondary | ICD-10-CM | POA: Diagnosis present

## 2018-04-18 DIAGNOSIS — R6521 Severe sepsis with septic shock: Secondary | ICD-10-CM | POA: Diagnosis present

## 2018-04-18 DIAGNOSIS — J181 Lobar pneumonia, unspecified organism: Secondary | ICD-10-CM | POA: Diagnosis not present

## 2018-04-18 DIAGNOSIS — Z66 Do not resuscitate: Secondary | ICD-10-CM | POA: Diagnosis not present

## 2018-04-18 DIAGNOSIS — Z4682 Encounter for fitting and adjustment of non-vascular catheter: Secondary | ICD-10-CM | POA: Diagnosis not present

## 2018-04-18 DIAGNOSIS — N2581 Secondary hyperparathyroidism of renal origin: Secondary | ICD-10-CM | POA: Diagnosis not present

## 2018-04-18 DIAGNOSIS — Z905 Acquired absence of kidney: Secondary | ICD-10-CM

## 2018-04-18 DIAGNOSIS — E872 Acidosis, unspecified: Secondary | ICD-10-CM | POA: Diagnosis present

## 2018-04-18 DIAGNOSIS — E782 Mixed hyperlipidemia: Secondary | ICD-10-CM | POA: Diagnosis present

## 2018-04-18 DIAGNOSIS — Z4659 Encounter for fitting and adjustment of other gastrointestinal appliance and device: Secondary | ICD-10-CM

## 2018-04-18 DIAGNOSIS — I120 Hypertensive chronic kidney disease with stage 5 chronic kidney disease or end stage renal disease: Secondary | ICD-10-CM | POA: Diagnosis present

## 2018-04-18 DIAGNOSIS — Z9911 Dependence on respirator [ventilator] status: Secondary | ICD-10-CM | POA: Diagnosis not present

## 2018-04-18 DIAGNOSIS — Z833 Family history of diabetes mellitus: Secondary | ICD-10-CM

## 2018-04-18 DIAGNOSIS — Z7984 Long term (current) use of oral hypoglycemic drugs: Secondary | ICD-10-CM

## 2018-04-18 DIAGNOSIS — Z89512 Acquired absence of left leg below knee: Secondary | ICD-10-CM

## 2018-04-18 DIAGNOSIS — Z7982 Long term (current) use of aspirin: Secondary | ICD-10-CM

## 2018-04-18 DIAGNOSIS — I428 Other cardiomyopathies: Secondary | ICD-10-CM | POA: Diagnosis present

## 2018-04-18 DIAGNOSIS — R000 Tachycardia, unspecified: Secondary | ICD-10-CM | POA: Diagnosis not present

## 2018-04-18 DIAGNOSIS — Z87891 Personal history of nicotine dependence: Secondary | ICD-10-CM

## 2018-04-18 LAB — CBC WITH DIFFERENTIAL/PLATELET
Abs Immature Granulocytes: 0.24 10*3/uL — ABNORMAL HIGH (ref 0.00–0.07)
Basophils Absolute: 0 10*3/uL (ref 0.0–0.1)
Basophils Relative: 0 %
EOS PCT: 0 %
Eosinophils Absolute: 0.1 10*3/uL (ref 0.0–0.5)
HCT: 29.5 % — ABNORMAL LOW (ref 39.0–52.0)
Hemoglobin: 7.9 g/dL — ABNORMAL LOW (ref 13.0–17.0)
Immature Granulocytes: 1 %
Lymphocytes Relative: 7 %
Lymphs Abs: 1.3 10*3/uL (ref 0.7–4.0)
MCH: 27.5 pg (ref 26.0–34.0)
MCHC: 26.8 g/dL — ABNORMAL LOW (ref 30.0–36.0)
MCV: 102.8 fL — ABNORMAL HIGH (ref 80.0–100.0)
Monocytes Absolute: 1.4 10*3/uL — ABNORMAL HIGH (ref 0.1–1.0)
Monocytes Relative: 7 %
Neutro Abs: 16.3 10*3/uL — ABNORMAL HIGH (ref 1.7–7.7)
Neutrophils Relative %: 85 %
Platelets: 210 10*3/uL (ref 150–400)
RBC: 2.87 MIL/uL — ABNORMAL LOW (ref 4.22–5.81)
RDW: 18.3 % — ABNORMAL HIGH (ref 11.5–15.5)
WBC: 19.3 10*3/uL — ABNORMAL HIGH (ref 4.0–10.5)
nRBC: 3.3 % — ABNORMAL HIGH (ref 0.0–0.2)

## 2018-04-18 LAB — BLOOD GAS, VENOUS
ACID-BASE DEFICIT: 5.1 mmol/L — AB (ref 0.0–2.0)
Bicarbonate: 17.7 mmol/L — ABNORMAL LOW (ref 20.0–28.0)
FIO2: 28
O2 Saturation: 8.5 %
Patient temperature: 38.6
pCO2, Ven: 46.7 mmHg (ref 44.0–60.0)
pH, Ven: 7.268 (ref 7.250–7.430)
pO2, Ven: <31 mmHg — CL (ref 32.0–45.0)

## 2018-04-18 LAB — COMPREHENSIVE METABOLIC PANEL
ALT: 9 U/L (ref 0–44)
AST: 99 U/L — ABNORMAL HIGH (ref 15–41)
Albumin: 1.8 g/dL — ABNORMAL LOW (ref 3.5–5.0)
Alkaline Phosphatase: 80 U/L (ref 38–126)
Anion gap: 18 — ABNORMAL HIGH (ref 5–15)
BUN: 57 mg/dL — ABNORMAL HIGH (ref 8–23)
CO2: 18 mmol/L — ABNORMAL LOW (ref 22–32)
Calcium: 8 mg/dL — ABNORMAL LOW (ref 8.9–10.3)
Chloride: 102 mmol/L (ref 98–111)
Creatinine, Ser: 9.22 mg/dL — ABNORMAL HIGH (ref 0.61–1.24)
GFR calc Af Amer: 6 mL/min — ABNORMAL LOW (ref >60)
GFR, EST NON AFRICAN AMERICAN: 5 mL/min — AB (ref >60)
Glucose, Bld: 263 mg/dL — ABNORMAL HIGH (ref 70–99)
Potassium: 4.2 mmol/L (ref 3.5–5.1)
Sodium: 138 mmol/L (ref 135–145)
Total Bilirubin: 1.1 mg/dL (ref 0.3–1.2)
Total Protein: 5.6 g/dL — ABNORMAL LOW (ref 6.5–8.1)

## 2018-04-18 LAB — GLUCOSE, CAPILLARY
Glucose-Capillary: 237 mg/dL — ABNORMAL HIGH (ref 70–99)
Glucose-Capillary: 251 mg/dL — ABNORMAL HIGH (ref 70–99)
Glucose-Capillary: 245 mg/dL — ABNORMAL HIGH (ref 70–99)

## 2018-04-18 LAB — I-STAT TROPONIN, ED: Troponin i, poc: 0.09 ng/mL (ref 0.00–0.08)

## 2018-04-18 LAB — BLOOD GAS, ARTERIAL
Acid-base deficit: 4.3 mmol/L — ABNORMAL HIGH (ref 0.0–2.0)
Bicarbonate: 20.6 mmol/L (ref 20.0–28.0)
FIO2: 28
O2 Saturation: 94.4 %
PH ART: 7.283 — AB (ref 7.350–7.450)
Patient temperature: 37.6
pCO2 arterial: 46.7 mmHg (ref 32.0–48.0)
pO2, Arterial: 83.4 mmHg (ref 83.0–108.0)

## 2018-04-18 LAB — LACTIC ACID, PLASMA
Lactic Acid, Venous: 5.6 mmol/L (ref 0.5–1.9)
Lactic Acid, Venous: 2.8 mmol/L (ref 0.5–1.9)
Lactic Acid, Venous: 1.9 mmol/L (ref 0.5–1.9)

## 2018-04-18 LAB — INFLUENZA PANEL BY PCR (TYPE A & B)
Influenza A By PCR: NEGATIVE
Influenza B By PCR: NEGATIVE

## 2018-04-18 LAB — PROTIME-INR
INR: 3.4 — ABNORMAL HIGH (ref 0.8–1.2)
Prothrombin Time: 34.1 seconds — ABNORMAL HIGH (ref 11.4–15.2)

## 2018-04-18 LAB — AMMONIA: Ammonia: 18 umol/L (ref 9–35)

## 2018-04-18 LAB — CBG MONITORING, ED
Glucose-Capillary: 219 mg/dL — ABNORMAL HIGH (ref 70–99)
Glucose-Capillary: 257 mg/dL — ABNORMAL HIGH (ref 70–99)

## 2018-04-18 MED ORDER — SODIUM CHLORIDE 0.9 % IV SOLN
2.0000 g | Freq: Once | INTRAVENOUS | Status: AC
  Administered 2018-04-18: 2 g via INTRAVENOUS
  Filled 2018-04-18: qty 2

## 2018-04-18 MED ORDER — INSULIN ASPART 100 UNIT/ML ~~LOC~~ SOLN: 0.0000 [IU] | Freq: Three times a day (TID) | SUBCUTANEOUS | Status: DC

## 2018-04-18 MED ORDER — SEVELAMER CARBONATE 800 MG PO TABS: 3200.0000 mg | ORAL_TABLET | Freq: Three times a day (TID) | ORAL | Status: DC

## 2018-04-18 MED ORDER — SODIUM CHLORIDE 0.9 % IV SOLN
250.0000 mL | INTRAVENOUS | Status: DC | PRN
  Administered 2018-04-19: 250 mL via INTRAVENOUS

## 2018-04-18 MED ORDER — AMIODARONE HCL 200 MG PO TABS
200.0000 mg | ORAL_TABLET | Freq: Every day | ORAL | Status: DC
  Administered 2018-04-19: 200 mg via ORAL
  Filled 2018-04-18 (×2): qty 1

## 2018-04-18 MED ORDER — SODIUM CHLORIDE 0.9 % IV SOLN: 500.0000 mg | INTRAVENOUS | Status: DC

## 2018-04-18 MED ORDER — VANCOMYCIN HCL IN DEXTROSE 1-5 GM/200ML-% IV SOLN
1000.0000 mg | Freq: Once | INTRAVENOUS | Status: AC
  Administered 2018-04-18: 1000 mg via INTRAVENOUS
  Filled 2018-04-18: qty 200

## 2018-04-18 MED ORDER — SEVELAMER CARBONATE 800 MG PO TABS: 1600.0000 mg | ORAL_TABLET | ORAL | Status: DC | PRN

## 2018-04-18 MED ORDER — SODIUM CHLORIDE 0.9 % IV BOLUS
500.0000 mL | Freq: Once | INTRAVENOUS | Status: AC
  Administered 2018-04-18: 500 mL via INTRAVENOUS

## 2018-04-18 MED ORDER — ALBUTEROL SULFATE (2.5 MG/3ML) 0.083% IN NEBU: 2.5000 mg | INHALATION_SOLUTION | RESPIRATORY_TRACT | Status: DC | PRN

## 2018-04-18 MED ORDER — FENOFIBRATE 160 MG PO TABS: 160.0000 mg | ORAL_TABLET | Freq: Every day | ORAL | Status: DC

## 2018-04-18 MED ORDER — SODIUM CHLORIDE 0.9 % IV SOLN: 2.0000 g | INTRAVENOUS | Status: DC

## 2018-04-18 MED ORDER — POLYETHYLENE GLYCOL 3350 17 G PO PACK: 17.0000 g | PACK | Freq: Every day | ORAL | Status: DC | PRN

## 2018-04-18 MED ORDER — MIDODRINE HCL 5 MG PO TABS
10.0000 mg | ORAL_TABLET | Freq: Three times a day (TID) | ORAL | Status: DC
  Administered 2018-04-18 – 2018-04-20 (×5): 10 mg via ORAL
  Filled 2018-04-18 (×6): qty 2

## 2018-04-18 MED ORDER — ACETAMINOPHEN 325 MG PO TABS: 650.0000 mg | ORAL_TABLET | Freq: Four times a day (QID) | ORAL | Status: DC | PRN

## 2018-04-18 MED ORDER — ASPIRIN EC 81 MG PO TBEC
81.0000 mg | DELAYED_RELEASE_TABLET | Freq: Every day | ORAL | Status: DC
  Administered 2018-04-19: 81 mg via ORAL
  Filled 2018-04-18 (×2): qty 1

## 2018-04-18 MED ORDER — ONDANSETRON HCL 4 MG PO TABS: 4.0000 mg | ORAL_TABLET | Freq: Four times a day (QID) | ORAL | Status: DC | PRN

## 2018-04-18 MED ORDER — TRAZODONE HCL 50 MG PO TABS: 50.0000 mg | ORAL_TABLET | Freq: Every evening | ORAL | Status: DC | PRN

## 2018-04-18 MED ORDER — ACETAMINOPHEN 650 MG RE SUPP: 650.0000 mg | Freq: Four times a day (QID) | RECTAL | Status: DC | PRN

## 2018-04-18 MED ORDER — SODIUM CHLORIDE 0.9% FLUSH
3.0000 mL | Freq: Two times a day (BID) | INTRAVENOUS | Status: DC
  Administered 2018-04-19 (×2): 3 mL via INTRAVENOUS

## 2018-04-18 MED ORDER — VANCOMYCIN HCL 10 G IV SOLR
1750.0000 mg | Freq: Once | INTRAVENOUS | Status: DC
  Filled 2018-04-18: qty 1750

## 2018-04-18 MED ORDER — MIDODRINE HCL 5 MG PO TABS
10.0000 mg | ORAL_TABLET | Freq: Three times a day (TID) | ORAL | Status: DC
  Administered 2018-04-18 (×2): 10 mg via ORAL
  Filled 2018-04-18 (×5): qty 2

## 2018-04-18 MED ORDER — ONDANSETRON HCL 4 MG/2ML IJ SOLN: 4.0000 mg | Freq: Four times a day (QID) | INTRAMUSCULAR | Status: DC | PRN

## 2018-04-18 MED ORDER — BENEFIBER DRINK MIX PO PACK: PACK | Freq: Every day | ORAL | Status: DC

## 2018-04-18 MED ORDER — SODIUM CHLORIDE 0.9% FLUSH: 3.0000 mL | INTRAVENOUS | Status: DC | PRN

## 2018-04-18 MED ORDER — CINACALCET HCL 30 MG PO TABS
30.0000 mg | ORAL_TABLET | Freq: Every day | ORAL | Status: DC
  Filled 2018-04-18: qty 1

## 2018-04-18 MED ORDER — ROSUVASTATIN CALCIUM 5 MG PO TABS: 5.0000 mg | ORAL_TABLET | Freq: Every day | ORAL | Status: DC

## 2018-04-18 NOTE — ED Triage Notes (Signed)
Pt suddenly started speaking. Pt oriented to self.

## 2018-04-18 NOTE — ED Triage Notes (Signed)
EMS reports pt is a resident at [facility].  PT was taken to dialysis this morning.  Dialysis staff notice pt had altered mental status and bp was 67/51.  Dialysis called ems.  Staff told ems pt usually alert, oriented, and "very vocal."  Pt had bilateral amputation of legs recently.  Dialysis staff says [facility] "dropped pt off this way."  LKW is unknown at this time.  EMS gave 559ml bolus and bp 78/46 upon arrival.  Pt's eyes open but not responsive.

## 2018-04-18 NOTE — Progress Notes (Signed)
ANTICOAGULATION CONSULT NOTE - Initial Consult  Pharmacy Consult for Coumadin Indication: atrial fibrillation  Allergies  Allergen Reactions  . Metformin And Related Other (See Comments)    Diarrhea, bloody stool, upset stomach.     Patient Measurements: Height: 6\' 2"  (188 cm) Weight: 191 lb 12.8 oz (87 kg) IBW/kg (Calculated) : 82.2  Vital Signs: Temp: 101.4 F (38.6 C) (02/25 0719) Temp Source: Rectal (02/25 0719) BP: 90/64 (02/25 1425) Pulse Rate: 39 (02/25 1325)  Labs: Recent Labs    04/01/2018 0751  HGB 7.9*  HCT 29.5*  PLT 210  LABPROT 34.1*  INR 3.4*  CREATININE 9.22*    Estimated Creatinine Clearance: 9.4 mL/min (A) (by C-G formula based on SCr of 9.22 mg/dL (H)).   Medical History: Past Medical History:  Diagnosis Date  . Anemia   . Atrial fibrillation ([place])   . Cardiomyopathy    LVEF 50-55% 8/11 - [facility]  . Colonic polyp   . Diverticulosis of colon   . ESRD on hemodialysis ([place])    Dr. [facility]anda Foster [facility]  . Essential hypertension, benign   . Gout   . Mixed hyperlipidemia   . Neuropathy    in both feet  . OSA (obstructive sleep apnea)    CPAP  . Renal cell cancer ([place])   . Type 2 diabetes mellitus (HCC)    Type II    Medications:  See med rec  Assessment: 63 yo male chronically anticoagulated with coumadin for afib. Pharmacy asked to dose.  INR is elevated today. Home dose is 2.5mg  Monday, Wednesday, Friday, Saturday and 1.25mg  on Tuesday, Thursday, and Sunday  Goal of Therapy:  INR 2-3 Monitor platelets by anticoagulation protocol: Yes   Plan:  No Coumadin today PT-INR daily Monitor for S/S of bleeding  Isac Sarna, BS [place], BCPS Clinical Pharmacist Pager [phone] 04/12/2018,2:52 PM

## 2018-04-18 NOTE — Progress Notes (Signed)
Pharmacy Note:  Initial antibiotic(s) regimen of vancomycin and cefepime ordered by EDP to treat sepsis.  Estimated Creatinine Clearance: 9.4 mL/min (A) (by C-G formula based on SCr of 9.22 mg/dL (H)).   Allergies  Allergen Reactions  . Metformin And Related Other (See Comments)    Diarrhea, bloody stool, upset stomach.     Vitals:   03/28/2018 1500 04/19/2018 1515  BP: (!) 82/60 (!) 86/59  Pulse:    Resp: 15 12  Temp:    SpO2:      Anti-infectives (From admission, onward)   Start     Dose/Rate Route Frequency Ordered Stop   03/26/2018 0930  vancomycin (VANCOCIN) IVPB 1000 mg/200 mL premix     1,000 mg 200 mL/hr over 60 Minutes Intravenous  Once 04/05/2018 0810 04/17/2018 1051   04/15/2018 0830  vancomycin (VANCOCIN) IVPB 1000 mg/200 mL premix     1,000 mg 200 mL/hr over 60 Minutes Intravenous  Once 04/07/2018 0810 04/03/2018 0926   03/31/2018 0815  vancomycin (VANCOCIN) 1,750 mg in sodium chloride 0.9 % 500 mL IVPB  Status:  Discontinued     1,750 mg 250 mL/hr over 120 Minutes Intravenous  Once 04/01/2018 0737 04/02/2018 0810   03/28/2018 0745  ceFEPIme (MAXIPIME) 2 g in sodium chloride 0.9 % 100 mL IVPB     2 g 200 mL/hr over 30 Minutes Intravenous  Once 03/30/2018 0737 03/31/2018 0829   04/03/2018 0730  cefTRIAXone (ROCEPHIN) 2 g in sodium chloride 0.9 % 100 mL IVPB  Status:  Discontinued     2 g 200 mL/hr over 30 Minutes Intravenous Every 24 hours 04/01/2018 0725 03/30/2018 0736   04/04/2018 0730  azithromycin (ZITHROMAX) 500 mg in sodium chloride 0.9 % 250 mL IVPB  Status:  Discontinued     500 mg 250 mL/hr over 60 Minutes Intravenous Every 24 hours 04/19/2018 0725 04/02/2018 0736     Plan: Initial dose(s) of Cefepime 2gm IV and Vancomycin 2000mg  IV X 1 ordered. F/U admission orders for further dosing if therapy continued.  Cristy Friedlander, [facility] 03/31/2018 3:46 PM

## 2018-04-18 NOTE — ED Notes (Addendum)
Date and time results received: 04/19/2018 0827  Test: Lactic Acid  Critical Value: 5.6  Name of Provider Notified: Dr. Dayna Barker   Orders Received? Or Actions Taken?: See chart

## 2018-04-18 NOTE — ED Notes (Signed)
This RN went into room to give PO meds to pt.  Pt snoring and unable to arose.  VSS.  100% on 2 L [place].  CBG 257.  Pt grimacing in pain with moving or trying to wake up.  Dr. Joesph Fillers paged and made aware.  STAT ABG placed.  RT called.

## 2018-04-18 NOTE — Progress Notes (Signed)
    NEPHROLOGY NURSING NOTE:   Outpatient TTS hemodialysis records obtained from [facility]:  2K / 2.5Ca bath 4 hours, 15 minutes Qb 300 / Qd 600 EDW 97 kg Max UF 1.5 L/hr Gambro Revaclear 300 No heparin AVF LUA Hectorol 3.5 mcg q TTS   Rockwell [place], RN

## 2018-04-18 NOTE — ED Provider Notes (Signed)
Emergency Department Provider Note   I have reviewed the triage vital signs and the nursing notes.   HISTORY  Chief Complaint Hypotension and Altered Mental Status   HPI Albert [name] is a 63 y.o. male with multiple medical problems as documented below that was just discharged in the hospital for 5 days ago after being admitted for lower extremity infection status post BKA bilaterally.  Patient went to [facility] who took him to dialysis today.  Went to dialysis his blood pressure was in the 19J systolic and patient was unresponsive.  Awake but unresponsive.  They called EMS because this is not like him normally.  Normally he is very oriented and talkative.  On EMS arrival he was still hypotensive in the 70s to start on fluids and brought here for further evaluation.  On my evaluation patient is disoriented to time and situation.  He knows he is at a hospital and knows who he is.  He acknowledges being on dialysis and being in the hospital but does not know any details. No other associated or modifying symptoms.   Level V Caveat Secondary to Altered Mental Status.   Past Medical History:  Diagnosis Date  . Anemia   . Atrial fibrillation ([place])   . Cardiomyopathy    LVEF 50-55% 8/11 - [facility]  . Colonic polyp   . Diverticulosis of colon   . ESRD on hemodialysis ([place])    Dr. Lowanda Foster [facility]  . Essential hypertension, benign   . Gout   . Mixed hyperlipidemia   . Neuropathy    in both feet  . OSA (obstructive sleep apnea)    CPAP  . Renal cell cancer ([place])   . Type 2 diabetes mellitus ([place])    Type II    Patient Active Problem List   Diagnosis Date Noted  . Septic shock ([place]) 04/08/2018  . Sepsis ([place]) 04/17/2018  . Healthcare-associated pneumonia-----Rt Sided 04/15/2018  . Hemodialysis-associated hypotension   . Pressure injury of skin 04/04/2018  . ESRD on dialysis ([place])   . Necrosis of toe ([place])   . PVD (peripheral vascular disease) ([place])   . Cellulitis of left  lower extremity 03/20/2018  . Pancreas cyst 05/13/2017  . Abnormal CT scan, stomach   . Cholelithiasis 04/05/2017  . Biliary dyskinesia 04/05/2017  . Abdominal pain 03/11/2017  . Abdominal swelling 03/11/2017  . Hyperglycemia 01/21/2017  . Gastritis and gastroduodenitis   . Abnormal findings on esophagogastroduodenoscopy (EGD)   . GI bleed 01/05/2017  . Heme positive stool   . Gastrointestinal hemorrhage   . Supratherapeutic INR   . Coagulopathy ([place]) 01/03/2017  . Anemia 01/03/2017  . Constipation 12/17/2015  . Rectal bleeding 12/17/2015  . History of colonic polyps 12/17/2015  . Visit for suture removal 12/06/2013  . Bradycardia 10/12/2013  . Metabolic acidosis 47/82/9562  . Acute respiratory failure with hypercapnia ([place]) 10/12/2013  . Encounter for therapeutic drug monitoring 03/29/2013  . Atrial fibrillation ([place]) 03/10/2011  . Long term (current) use of anticoagulants 03/10/2011  . Secondary cardiomyopathy, unspecified 03/10/2011  . ESRD (end stage renal disease) ([place]) 03/10/2011  . HYPERLIPIDEMIA 08/24/2007  . PRIMARY CENTRAL SLEEP APNEA 08/24/2007  . OBSTRUCTIVE SLEEP APNEA 08/24/2007  . Essential hypertension, benign 08/24/2007    Past Surgical History:  Procedure Laterality Date  . ABDOMINAL AORTOGRAM W/LOWER EXTREMITY N/A 03/27/2018   Procedure: ABDOMINAL AORTOGRAM W/LOWER EXTREMITY;  Surgeon: Waynetta Sandy, MD;  Location: [facility];  Service: Cardiovascular;  Laterality: N/A;  .  AMPUTATION Bilateral 03/29/2018   Procedure: RESECTION TRANSMETATARSAL - LEFT SECOND TOE OF LEFT FOOT TOE RESECTION TRANSMETATARSAL FIRST ,SECOND,THIRD TOE RIGHT FOOT REMOVAL SECOND TOE GREAT TOE ,THIRD TOE;  Surgeon: Elam Dutch, MD;  Location: [facility];  Service: Vascular;  Laterality: Bilateral;  . AMPUTATION Left 04/08/2018   Procedure: LEFT BELOW KNEE AMPUTATION;  Surgeon: Elam Dutch, MD;  Location: [facility];  Service: Vascular;  Laterality: Left;  . AMPUTATION  Right 04/11/2018   Procedure: RIGHT BELOW KNEE AMPUTATION;  Surgeon: Waynetta Sandy, MD;  Location: [facility];  Service: Vascular;  Laterality: Right;  . BIOPSY  01/05/2017   Procedure: BIOPSY;  Surgeon: Daneil Dolin, MD;  [facility];  Service: Endoscopy;;  gastric  . COLONOSCOPY N/A 01/07/2016   Dr. [name]: Grade 1 internal hemorrhoids, otherwise normal  . COLONOSCOPY W/ POLYPECTOMY  2009, 2012   2009: Dr. [name]: 1.25 cm adenomatous polyp without high grade dysplasia, distal to IC valve. 2012: normal rectum, few pancolonic diverticula, single diminutive hyperplastic polyp  . ESOPHAGOGASTRODUODENOSCOPY  2009   Dr. [name]: accentuated undulating Z-lin with couple of islands of salmon-colored epithelium suspicious for Barrett's, s/p biopsy with path revealing mild inflammation, no metaplasia, dysplasia, or malignancy  . ESOPHAGOGASTRODUODENOSCOPY N/A 01/05/2017   Procedure: ESOPHAGOGASTRODUODENOSCOPY (EGD);  Surgeon: Daneil Dolin, MD;  [facility];  Service: Endoscopy;  Laterality: N/A;  . EUS N/A 04/21/2017   Procedure: UPPER ENDOSCOPIC ULTRASOUND (EUS) LINEAR;  Surgeon: Milus Banister, MD;  [facility];  Service: Endoscopy;  Laterality: N/A;  . FISTULA SUPERFICIALIZATION Left 03/31/2015   Procedure: CEPHALIC VEIN TURNDOWN; PLICATION OF BRACHIOCEPHALIC AVF;  Surgeon: Conrad Elm Grove, MD;  Location: [facility];  Service: Vascular;  Laterality: Left;  . FISTULOGRAM N/A 10/19/2013   Procedure: FISTULOGRAM;  Surgeon: Rosetta Posner, MD;  Location: [facility];  Service: Cardiovascular;  Laterality: N/A;  . Left arm AV fistula  2009   Dr. [name]  . PERIPHERAL VASCULAR BALLOON ANGIOPLASTY  03/27/2018   Procedure: PERIPHERAL VASCULAR BALLOON ANGIOPLASTY;  Surgeon: Waynetta Sandy, MD;  Location: [facility];  Service: Cardiovascular;;  LT PT  . PERIPHERAL VASCULAR CATHETERIZATION N/A 03/10/2015   Procedure: Fistulagram;  Surgeon: Conrad Modoc, MD;   Location: [facility];  Service: Cardiovascular;  Laterality: N/A;  . Right nephrectomy      Current Outpatient Rx  . Order #: [id number] Class: Historical Med  . Order #: [id number] Class: No Print  . Order #: [id number] Class: No Print  . Order #: [id number] Class: Historical Med  . Order #: [id number] Class: Normal  . Order #: [id number] Class: Historical Med  . Order #: [id number] Class: Historical Med  . Order #: [id number] Class: Historical Med  . Order #: [id number] Class: Historical Med  . Order #: [id number] Class: Historical Med  . Order #: [id number] Class: No Print  . Order #: [id number] Class: No Print  . Order #: [id number] Class: Print  . Order #: [id number] Class: Normal  . Order #: [id number] Class: Historical Med  . Order #: [id number] Class: Historical Med  . Order #: [id number] Class: Historical Med  . Order #: [id number] Class: Normal  . Order #: [id number] Class: Historical Med  . Order #: [id number] Class: Historical Med  . Order #: [id number] Class: Historical Med    Allergies Metformin and related  Family History  Problem Relation Age of Onset  . Hypertension Mother   . Hypertension Other        Siblings  . Diabetes type II Other  Siblings  . Colon cancer Neg Hx     Social History Social History   Tobacco Use  . Smoking status: Former Smoker    Types: Cigarettes    Last attempt to quit: 07/04/1998    Years since quitting: 19.8  . Smokeless tobacco: Never Used  . Tobacco comment: Quit May 2000  Substance Use Topics  . Alcohol use: No    Alcohol/week: 0.0 standard drinks    Comment: Quit 2014; Previously drank about 3 drinks a day  . Drug use: No    Review of Systems  All other systems negative except as documented in the HPI. All pertinent positives and negatives as reviewed in the HPI. ____________________________________________   PHYSICAL EXAM:  VITAL SIGNS: ED Triage Vitals  Enc Vitals Group     BP 04/02/2018 0719 (!) 78/46     Pulse Rate 04/05/2018 0719  (!) 124     Resp --      Temp 04/11/2018 0719 (!) 101.4 F (38.6 C)     Temp Source 03/27/2018 0719 Rectal     SpO2 04/01/2018 0719 91 %     Weight 04/08/2018 0716 191 lb 12.8 oz (87 kg)     Height 04/03/2018 0716 6\' 2"  (1.88 m)    Constitutional: Alert but disoriented. Well appearing and in no acute distress. Eyes: Conjunctivae are normal. PERRL. EOMI. Head: Atraumatic. Nose: No congestion/rhinnorhea. Mouth/Throat: Mucous membranes are dry.  Oropharynx non-erythematous. Neck: No stridor.  No meningeal signs.   Cardiovascular: Normal rate, regular rhythm. Good peripheral circulation. Grossly normal heart sounds.   Respiratory: Normal respiratory effort.  No retractions. Lungs crackles on left. Gastrointestinal: Soft and nontender. No distention.  Musculoskeletal: No lower extremity tenderness nor edema. Bilateral BKA's. Staples and wounds look overall good aside from some blisters around the wound on left. No erythema, warmth, drainage, edema. Neurologic:  Garbled speech and language. Weak but symmetric grip strength. Raises eyebrows.  Sticks out tongue.  Attempt to smile without any obvious droop.  Extraocular movements are intact.  Is able to feel pressure to bilateral hands. Skin:  Skin is warm, dry and intact. No rash noted.   ____________________________________________   LABS (all labs ordered are listed, but only abnormal results are displayed)  Labs Reviewed  LACTIC ACID, PLASMA - Abnormal; Notable for the following components:      Result Value   Lactic Acid, Venous 5.6 (*)    All other components within normal limits  LACTIC ACID, PLASMA - Abnormal; Notable for the following components:   Lactic Acid, Venous 2.8 (*)    All other components within normal limits  COMPREHENSIVE METABOLIC PANEL - Abnormal; Notable for the following components:   CO2 18 (*)    Glucose, Bld 263 (*)    BUN 57 (*)    Creatinine, Ser 9.22 (*)    Calcium 8.0 (*)    Total Protein 5.6 (*)    Albumin  1.8 (*)    AST 99 (*)    GFR calc non Af Amer 5 (*)    GFR calc Af Amer 6 (*)    Anion gap 18 (*)    All other components within normal limits  CBC WITH DIFFERENTIAL/PLATELET - Abnormal; Notable for the following components:   WBC 19.3 (*)    RBC 2.87 (*)    Hemoglobin 7.9 (*)    HCT 29.5 (*)    MCV 102.8 (*)    MCHC 26.8 (*)    RDW 18.3 (*)  nRBC 3.3 (*)    Neutro Abs 16.3 (*)    Monocytes Absolute 1.4 (*)    Abs Immature Granulocytes 0.24 (*)    All other components within normal limits  PROTIME-INR - Abnormal; Notable for the following components:   Prothrombin Time 34.1 (*)    INR 3.4 (*)    All other components within normal limits  BLOOD GAS, VENOUS - Abnormal; Notable for the following components:   pO2, Ven <31.0 (*)    Bicarbonate 17.7 (*)    Acid-base deficit 5.1 (*)    All other components within normal limits  BLOOD GAS, ARTERIAL - Abnormal; Notable for the following components:   pH, Arterial 7.283 (*)    Acid-base deficit 4.3 (*)    All other components within normal limits  CBG MONITORING, ED - Abnormal; Notable for the following components:   Glucose-Capillary 219 (*)    All other components within normal limits  I-STAT TROPONIN, ED - Abnormal; Notable for the following components:   Troponin i, poc 0.09 (*)    All other components within normal limits  CBG MONITORING, ED - Abnormal; Notable for the following components:   Glucose-Capillary 257 (*)    All other components within normal limits  CULTURE, BLOOD (ROUTINE X 2)  CULTURE, BLOOD (ROUTINE X 2)  INFLUENZA PANEL BY PCR (TYPE A & B)  LACTIC ACID, PLASMA  URINALYSIS, ROUTINE W REFLEX MICROSCOPIC  AMMONIA   ____________________________________________  EKG   EKG Interpretation  Date/Time:  Tuesday April 18 2018 07:16:06 EST Ventricular Rate:  122 PR Interval:    QRS Duration: 102 QT Interval:  304 QTC Calculation: 433 R Axis:   93 Text Interpretation:  Atrial fibrillation Right axis  deviation Low voltage, extremity leads Repol abnrm suggests ischemia, diffuse leads given lead placement discrepancies, no acute changes from most recent in [place] Confirmed by Merrily Pew [phone]) on 03/27/2018 8:06:37 AM       ____________________________________________  RADIOLOGY  Ct Head Wo Contrast  Result Date: 03/27/2018 CLINICAL DATA:  Altered mental status. Hypotension. EXAM: CT HEAD WITHOUT CONTRAST TECHNIQUE: Contiguous axial images were obtained from the base of the skull through the vertex without intravenous contrast. COMPARISON:  12/24/2003. FINDINGS: Brain: Interval mild enlargement of the subarachnoid spaces. Normal size and position of the ventricles. Minimal patchy white matter low density in both cerebral hemispheres. Small pituitary gland with no enlargement of the sella. No intracranial hemorrhage, mass lesion or CT evidence of acute infarction. Vascular: No hyperdense vessel or unexpected calcification. Skull: Normal. Negative for fracture or focal lesion. Sinuses/Orbits: Unremarkable. Other: None. IMPRESSION: 1. No acute abnormality. 2. Interval mild diffuse cerebral and cerebellar atrophy. 3. Minimal chronic small vessel white matter ischemic changes in both cerebral hemispheres. Electronically Signed   By: Claudie Revering M.D.   On: 04/10/2018 10:18   Dg Chest Port 1 View  Result Date: 04/17/2018 CLINICAL DATA:  Fever with hypoxia EXAM: PORTABLE CHEST 1 VIEW COMPARISON:  January 04, 2017 FINDINGS: There is focal airspace consolidation in the medial left base. There is atelectasis with apparent superimposed infiltrate in the right base. The lungs elsewhere are clear. Heart is borderline enlarged with pulmonary vascularity normal. No adenopathy. There is aortic atherosclerosis. Note bony bridge between the anterior right first and second ribs, a likely anatomic variant. There is degenerative change in the left shoulder. IMPRESSION: Patchy airspace consolidation in the lung  bases with associated atelectasis in the right base. Heart is borderline enlarged. Aortic Atherosclerosis (ICD10-I70.0). Electronically Signed  By: Lowella Grip III M.D.   On: 03/29/2018 07:49    ____________________________________________   PROCEDURES  Procedure(s) performed:   Procedures  CRITICAL CARE Performed by: Merrily Pew Total critical care time: 35 minutes Critical care time was exclusive of separately billable procedures and treating other patients. Critical care was necessary to treat or prevent imminent or life-threatening deterioration. Critical care was time spent personally by me on the following activities: development of treatment plan with patient and/or surrogate as well as nursing, discussions with consultants, evaluation of patient's response to treatment, examination of patient, obtaining history from patient or surrogate, ordering and performing treatments and interventions, ordering and review of laboratory studies, ordering and review of radiographic studies, pulse oximetry and re-evaluation of patient's condition.  ____________________________________________   INITIAL IMPRESSION / ASSESSMENT AND PLAN / ED COURSE  Patient ended up having a fever with tachycardia and low blood pressure.  His oxygen saturation was not above 87% on room air and I do not think he wears oxygen at home.  Plan will be septic work-up.  Also consider possible neurologic cause however less likely.  He has a couple blisters around the left wound but no other evidence of infection in his recent BKA's.  With his oxygen requirement abnormal lung sounds and recent hospitalization will treat for pneumonia.  We will dose his Middaugh drain as well and check INR.  Patient would likely need to be admitted however want to see if it is here or at [facility].  To be admitted to the hospitalist I was called the patient bed by nursing secondary to altered mental status.  Patient was wholly  unresponsive his eyes were rolled up in his head.  Over the next 30 to 40 minutes this slowly improved.  During this he did not have any worsening hypotension, tachycardia, and tachypnea, hypoxia and had a elevated blood sugar.  Makes me wonder if maybe is having seizures.  Either way has been admitted to [facility] and I discussed with the inpatient medicine team will let them consult at their discretion. Protecting airway at this time, no need for intervention.  Pertinent labs & imaging results that were available during my care of the patient were reviewed by me and considered in my medical decision making (see chart for details).  ____________________________________________  FINAL CLINICAL IMPRESSION(S) / ED DIAGNOSES  Final diagnoses:  Altered mental status, unspecified altered mental status type  Sepsis, due to unspecified organism, unspecified whether acute organ dysfunction present ([place])     MEDICATIONS GIVEN DURING THIS VISIT:  Medications  cinacalcet (SENSIPAR) tablet 30 mg (has no administration in time range)  aspirin EC tablet 81 mg (81 mg Oral Not Given 04/13/2018 1545)  amiodarone (PACERONE) tablet 200 mg (200 mg Oral Not Given 04/21/2018 1546)  midodrine (PROAMATINE) tablet 10 mg (has no administration in time range)  rosuvastatin (CRESTOR) tablet 5 mg (5 mg Oral Not Given 04/06/2018 1545)  sevelamer carbonate (RENVELA) tablet 1,600-3,200 mg (has no administration in time range)  sodium chloride flush (NS) 0.9 % injection 3 mL (has no administration in time range)  sodium chloride flush (NS) 0.9 % injection 3 mL (has no administration in time range)  0.9 %  sodium chloride infusion (has no administration in time range)  acetaminophen (TYLENOL) tablet 650 mg (has no administration in time range)    Or  acetaminophen (TYLENOL) suppository 650 mg (has no administration in time range)  traZODone (DESYREL) tablet 50 mg (has no administration in time  range)  polyethylene glycol  (MIRALAX / GLYCOLAX) packet 17 g (has no administration in time range)  ondansetron (ZOFRAN) tablet 4 mg (has no administration in time range)    Or  ondansetron (ZOFRAN) injection 4 mg (has no administration in time range)  albuterol (PROVENTIL) (2.5 MG/3ML) 0.083% nebulizer solution 2.5 mg (has no administration in time range)  sodium chloride 0.9 % bolus 500 mL (0 mLs Intravenous Stopped 04/03/2018 0925)  ceFEPIme (MAXIPIME) 2 g in sodium chloride 0.9 % 100 mL IVPB (0 g Intravenous Stopped 03/27/2018 0829)  vancomycin (VANCOCIN) IVPB 1000 mg/200 mL premix (0 mg Intravenous Stopped 04/02/2018 0926)    Followed by  vancomycin (VANCOCIN) IVPB 1000 mg/200 mL premix (0 mg Intravenous Stopped 03/31/2018 1051)  sodium chloride 0.9 % bolus 500 mL (0 mLs Intravenous Stopped 04/01/2018 1129)  sodium chloride 0.9 % bolus 500 mL (0 mLs Intravenous Stopped 04/11/2018 1326)     NEW OUTPATIENT MEDICATIONS STARTED DURING THIS VISIT:  New Prescriptions   No medications on file    Note:  This note was prepared with assistance of Dragon voice recognition software. Occasional wrong-word or sound-a-like substitutions may have occurred due to the inherent limitations of voice recognition software.   Damia Bobrowski, Corene Cornea, MD 03/27/2018 [phone]

## 2018-04-18 NOTE — Significant Event (Addendum)
Rapid Response Event Note  Overview: Neurologic - Decreased LOC - Altered Mental Status  Initial Focused Assessment: Called by charge RN to assess patient for AMS and decreased LOC. Per nurse, patient is very hard to arouse and nurses were not able to illicit a response from the patient. When I arrived, patient was somnolent, responded to painful stimuli, once awake was still lethargic but able to follow simple commands and denied sensory loss and pain. Was disoriented to place but oriented to self. VS and CBG were normal. Patient had an ABG and HEAD CT earlier as well. Skin warm and dry. Per the chart, patient was admitted with the encephalopathy and has fluctuations in mental status per chart and staff.  Interventions: -- none-- -- CT HEAD was negative for acute abnormality -- ABG -- CO2 was normal.   Plan of Care: -- Patient did respond to me when I examined him. Unsure where this decreased LOC/AMS is coming from. Strength in extremities is equal, he has generalized weakness and no sensory loss as of now.  -- RN to do frequent neuro checks  -- RN notified primary service provider on call - no further orders  -- I called for update at 230 - patient was more responsive per nurse.   Event Summary:    at    Call Time 2226 Arrival Time 2228 End Time 2245  Abad Manard R

## 2018-04-18 NOTE — Progress Notes (Signed)
Pharmacy Antibiotic Note  Albert [name] is a 63 y.o. male admitted on 03/27/2018 with sepsis and PNA.  Pharmacy has been consulted for cefepime and vancomycin dosing. He is noted with ESRD on HD TTS -WBC= 19.3, tmax= 101.4, SCr= 9.22  -Vancomycin 2000mg  IV given ~ 9am today -Cefepime give ~ 8am today  Plan: -Once HD plans noted will begin cefepime 2gm IV with HD and vancomycin 1000mg  IV with HD -Will follow cultures and clinical progress   Height: 6\' 2"  (188 cm) Weight: 198 lb 10.2 oz (90.1 kg) IBW/kg (Calculated) : 82.2  Temp (24hrs), Avg:100 F (37.8 C), Min:98.9 F (37.2 C), Max:101.4 F (38.6 C)  Recent Labs  Lab 04/12/18 0043 04/12/18 0235 04/13/18 0741 04/13/18 0742 04/14/18 0310 04/21/2018 0751 04/15/2018 0959 04/19/2018 1500  WBC  --  19.7*  --  19.7* 17.8* 19.3*  --   --   CREATININE 9.72*  --  8.54*  --   --  9.22*  --   --   LATICACIDVEN  --   --   --   --   --  5.6* 2.8* 1.9    Estimated Creatinine Clearance: 9.4 mL/min (A) (by C-G formula based on SCr of 9.22 mg/dL (H)).    Allergies  Allergen Reactions  . Metformin And Related Other (See Comments)    Diarrhea, bloody stool, upset stomach.     Antimicrobials this admission: 2/25 cefepime 2/25 vanc  Dose adjustments this admission:   Microbiology results: 2/25 blood x2  Thank you for allowing pharmacy to be a part of this patient's care.  Hildred Laser, PharmD Clinical Pharmacist **Pharmacist phone directory can now be found on [email or web address] ([email or web address]).  Listed under [place].

## 2018-04-18 NOTE — ED Notes (Signed)
Date and time results received: 04/21/2018 @10 :40  Test: lactic acid Critical Value: 2.8  Name of Provider Notified: Dr Dayna Barker  Orders Received? Or Actions Taken?: no additional orders given,

## 2018-04-18 NOTE — H&P (Addendum)
Patient Demographics:    Albert [name], is a 63 y.o. male  MRN: [id number]   DOB - 1956-01-11  Admit Date - 04/17/2018  Outpatient Primary MD for the patient is Redmond School, MD   Assessment & Plan:    Principal Problem:   Septic shock [facility]) Active Problems:   Healthcare-associated pneumonia-----Rt Sided   Essential hypertension, benign   Atrial fibrillation ([place])   Long term (current) use of anticoagulants   ESRD (end stage renal disease) ([place])   Acute respiratory failure with hypercapnia ([place])   Anemia   PVD (peripheral vascular disease) ([place])   ESRD on dialysis ([place])   Sepsis ([place])    1)Septic shock secondary to Right-sided Pneumonia/HCAP--- patient met sepsis criteria on admission, systolic blood pressures 06Y to 80s initially despite IV fluids, after repeated fluid boluses MAP now consistently above 60 for the most part, altered mentation persist, fevers of 101.4,, tachycardia and tachypnea persist, mild hypoxia persist, leukocytosis above 19 K --- discussed with PCCM attending Dr. Nelda Marseille, who advised admission to progressive care unit as patient does not need pressors at this time, if patient decompensates please reconsult PCCM.  Pharmacy consult for cefepime and vancomycin pending cultures, chest x-ray on admission with right-sided pneumonia, repeat chest x-ray in the a.m. on 04/19/2018, influenza negative.  Initial lactic acid was 5.6, with hydration lactic acid is now down to 1.9,   2) acute toxic metabolic encephalopathy secondary to #1 above-----patient remains lethargic, treat as above in #1, get EEG to rule out concomitant seizures, hold gabapentin, serum ammonia is only 18, ABG without significant hypercapnia, CT head without acute intracranial findings  3)ESRD--- had ultrafiltration on the  outpatient hemodialysis and on 04/15/2018, please consult nephrologist in patients arrives at [facility] complex for ongoing hemodialysis on Tuesdays, Thursdays and Saturdays  4)DM2-- Pt  is very lethargic , currently n.p.o., allow some permissive Hyperglycemia rather than risk life-threatening hypoglycemia in a patient with unreliable oral intake. Use Novolog/Humalog Sliding scale insulin with Accu-Cheks/Fingersticks as ordered=------ hold metformin, hold linagliptin, hold glipizide  5) chronic atrial fibrillation--- continue amiodarone, hold Cardizem and metoprolol due to hypotension in the setting of septic shock, when feasible please restart metoprolol and missed doses of beta-blockers, INR currently above 3, pharmacy consult for Coumadin management  6) history of chronic hypotension with underlying essential hypertension--- at baseline previously on Cardizem and metoprolol mostly for rate control,  but also on midodrine at baseline due to hypotension in the past especially with hemodialysis  7)Anemia of ESRD--- EPO/Procrit per nephrology team , globin is down to 7.9, baseline around 9, no evidence of ongoing bleeding, transfuse as clinically indicated  8)Social/Ethics----plan of care, CODE STATUS discussed with patient and daughter,, Daughter Verdie Drown who lives in [place] is currently here and she is POA-----as per daughter patient is a full code without limitations to treatment, patient is too incoherent to make decisions at this time  9)FEN--- n.p.o. for now except for medications  due to altered mentation, ESRD patient avoid aggressive IV fluids   With History of - Reviewed by me  Past Medical History:  Diagnosis Date  . Anemia   . Atrial fibrillation ([place])   . Cardiomyopathy    LVEF 50-55% 8/11 - [facility]  . Colonic polyp   . Diverticulosis of colon   . ESRD on hemodialysis ([place])    Dr. Lowanda Foster [facility]  . Essential hypertension, benign   . Gout   . Mixed hyperlipidemia   . Neuropathy     in both feet  . OSA (obstructive sleep apnea)    CPAP  . Renal cell cancer ([place])   . Type 2 diabetes mellitus ([place])    Type II      Past Surgical History:  Procedure Laterality Date  . ABDOMINAL AORTOGRAM W/LOWER EXTREMITY N/A 03/27/2018   Procedure: ABDOMINAL AORTOGRAM W/LOWER EXTREMITY;  Surgeon: Waynetta Sandy, MD;  Location: [facility];  Service: Cardiovascular;  Laterality: N/A;  . AMPUTATION Bilateral 03/29/2018   Procedure: RESECTION TRANSMETATARSAL - LEFT SECOND TOE OF LEFT FOOT TOE RESECTION TRANSMETATARSAL FIRST ,SECOND,THIRD TOE RIGHT FOOT REMOVAL SECOND TOE GREAT TOE ,THIRD TOE;  Surgeon: Elam Dutch, MD;  Location: [facility];  Service: Vascular;  Laterality: Bilateral;  . AMPUTATION Left 04/08/2018   Procedure: LEFT BELOW KNEE AMPUTATION;  Surgeon: Elam Dutch, MD;  Location: [facility];  Service: Vascular;  Laterality: Left;  . AMPUTATION Right 04/11/2018   Procedure: RIGHT BELOW KNEE AMPUTATION;  Surgeon: Waynetta Sandy, MD;  Location: [facility];  Service: Vascular;  Laterality: Right;  . BIOPSY  01/05/2017   Procedure: BIOPSY;  Surgeon: Daneil Dolin, MD;  [facility];  Service: Endoscopy;;  gastric  . COLONOSCOPY N/A 01/07/2016   Dr. [name]: Grade 1 internal hemorrhoids, otherwise normal  . COLONOSCOPY W/ POLYPECTOMY  2009, 2012   2009: Dr. [name]: 1.25 cm adenomatous polyp without high grade dysplasia, distal to IC valve. 2012: normal rectum, few pancolonic diverticula, single diminutive hyperplastic polyp  . ESOPHAGOGASTRODUODENOSCOPY  2009   Dr. [name]: accentuated undulating Z-lin with couple of islands of salmon-colored epithelium suspicious for Barrett's, s/p biopsy with path revealing mild inflammation, no metaplasia, dysplasia, or malignancy  . ESOPHAGOGASTRODUODENOSCOPY N/A 01/05/2017   Procedure: ESOPHAGOGASTRODUODENOSCOPY (EGD);  Surgeon: Daneil Dolin, MD;  [facility];  Service: Endoscopy;  Laterality: N/A;    . EUS N/A 04/21/2017   Procedure: UPPER ENDOSCOPIC ULTRASOUND (EUS) LINEAR;  Surgeon: Milus Banister, MD;  [facility];  Service: Endoscopy;  Laterality: N/A;  . FISTULA SUPERFICIALIZATION Left 03/31/2015   Procedure: CEPHALIC VEIN TURNDOWN; PLICATION OF BRACHIOCEPHALIC AVF;  Surgeon: Conrad Inkster, MD;  Location: [facility];  Service: Vascular;  Laterality: Left;  . FISTULOGRAM N/A 10/19/2013   Procedure: FISTULOGRAM;  Surgeon: Rosetta Posner, MD;  Location: [facility];  Service: Cardiovascular;  Laterality: N/A;  . Left arm AV fistula  2009   Dr. [name]  . PERIPHERAL VASCULAR BALLOON ANGIOPLASTY  03/27/2018   Procedure: PERIPHERAL VASCULAR BALLOON ANGIOPLASTY;  Surgeon: Waynetta Sandy, MD;  Location: [facility];  Service: Cardiovascular;;  LT PT  . PERIPHERAL VASCULAR CATHETERIZATION N/A 03/10/2015   Procedure: Fistulagram;  Surgeon: Conrad Kimberly, MD;  Location: [facility];  Service: Cardiovascular;  Laterality: N/A;  . Right nephrectomy        Chief Complaint  Patient presents with  . Hypotension  . Altered Mental Status  HPI:    Albert [name]  is a 63 y.o. male with past medical history relevant foot ESRD with hemodialysis on Tuesday started on Saturdays, A. fib chronically anticoagulated on Coumadin with INR currently above 3 today, DM2 complicated by peripheral polyneuropathy, peripheral artery disease with nonhealing ulcer requiring recent bilateral BKA (left BKA on 04/08/2018 and right BKA on 04/11/2018) by  vascular surgery presenting from outpatient hemodialysis center on 04/02/2018 with altered mentation and hypotension after being sent there from [facility] where he was discharged to on 04/14/18  after recent hospitalization.  PT was taken to dialysis on 03/30/2018, Dialysis staff notice pt had altered mental status and bp was 67/51.  Dialysis called ems.  Staff told ems pt usually alert, oriented, and "very vocal."    Dialysis staff says  [facility] "dropped pt off this way."  LKW is unknown at this time.  EMS gave 577m bolus and bp 78/46 upon arrival.  Pt's eyes open but not responsive  In ED--- initially patient BP remained low despite repeated IV fluid boluses, lactic acid was elevated at 5.6, hypoxia, tachycardia and fevers noted----patient remains very lethargic, daughter at bedside, In ED--- chest x-ray suggestive of right-sided pneumonia, CT head without acute findings, In ED--- WBC above 19 K however patient WBC has been elevated since recent hospitalization, hemoglobin is 7.9 which is below his usual baseline of 9, no obvious bleeding noted  discussed with PCCM attending Dr. YNelda Marseille who advised admission to progressive care unit as patient does not need pressors at this time, if patient decompensates please reconsult PCCM.   History is limited due to altered mentation,   patient has dyspnea but it is unclear if he had productive cough or not  She received IV Vanco and cefepime, cultures are pending  [place] be [place]   Review of systems:    In addition to the HPI above,   A full Review of  Systems was done, all other systems reviewed are negative except as noted above in HPI , .    Social History:  Reviewed by me    Social History   Tobacco Use  . Smoking status: Former Smoker    Types: Cigarettes    Last attempt to quit: 07/04/1998    Years since quitting: 19.8  . Smokeless tobacco: Never Used  . Tobacco comment: Quit May 2000  Substance Use Topics  . Alcohol use: No    Alcohol/week: 0.0 standard drinks    Comment: Quit 2014; Previously drank about 3 drinks a day       Family History :  Reviewed by me    Family History  Problem Relation Age of Onset  . Hypertension Mother   . Hypertension Other        Siblings  . Diabetes type II Other        Siblings  . Colon cancer Neg Hx     Home Medications:   Prior to Admission medications   Medication Sig Start Date End  Date Taking? Authorizing Provider  allopurinol (ZYLOPRIM) 100 MG tablet Take 100 mg by mouth daily.    Yes [provider]  amiodarone (PACERONE) 200 MG tablet Take 1 tablet (200 mg total) by mouth daily. 04/15/18  Yes GCaren Griffins MD  aspirin EC 81 MG EC tablet Take 1 tablet (81 mg total) by mouth daily. 04/15/18  Yes GCaren Griffins MD  cinacalcet (SENSIPAR) 30 MG tablet Take 30 mg by mouth every  evening.    Yes [provider]  diltiazem (CARDIZEM CD) 180 MG 24 hr capsule TAKE 1 CAPSULE EVERY DAY Patient taking differently: Take 180 mg by mouth daily.  03/07/18  Yes [name], MD  docusate sodium (COLACE) 100 MG capsule Take 100 mg by mouth daily.   Yes [provider]  fenofibrate 160 MG tablet Take 160 mg by mouth at bedtime.    Yes [provider]  fish oil-omega-3 fatty acids 1000 MG capsule Take 1 g by mouth 3 (three) times daily.    Yes [provider]  gabapentin (NEURONTIN) 300 MG capsule Take 300 mg by mouth 3 (three) times daily.    Yes [provider]  glipiZIDE (GLUCOTROL) 5 MG tablet Take 5 mg by mouth daily before breakfast.   Yes [provider]  metoprolol tartrate (LOPRESSOR) 50 MG tablet Take 1 tablet (50 mg total) by mouth 2 (two) times daily. 04/14/18  Yes Gherghe, Vella Redhead, MD  midodrine (PROAMATINE) 10 MG tablet Take 1 tablet (10 mg total) by mouth 3 (three) times daily with meals. 04/14/18  Yes Gherghe, Vella Redhead, MD  oxyCODONE (OXY IR/ROXICODONE) 5 MG immediate release tablet Take 1-2 tablets (5-10 mg total) by mouth every 4 (four) hours as needed for severe pain. Patient taking differently: Take 5-10 mg by mouth every 4 (four) hours as needed for moderate pain.  04/14/18  Yes Caren Griffins, MD  pantoprazole (PROTONIX) 40 MG tablet Take 1 tablet (40 mg total) by mouth daily. Patient taking differently: Take 40 mg by mouth every evening.  04/05/17  Yes Virl Cagey, MD  pravastatin  (PRAVACHOL) 10 MG tablet Take 10 mg by mouth at bedtime.    Yes [provider]  rosuvastatin (CRESTOR) 5 MG tablet Take 5 mg by mouth daily.   Yes [provider]  TRADJENTA 5 MG TABS tablet Take 5 mg by mouth every morning.  04/13/17  Yes [provider]  warfarin (COUMADIN) 2.5 MG tablet TAKE 1 TABLET EVERY DAY EXCEPT TAKE  1/2 TABLET ON SUNDAYS, [place] OR AS DIRECTED Patient taking differently: Take 2.5 mg by mouth See admin instructions. Take 2.5 mg daily on Monday, Wednesday, Friday, and Saturday. 02/23/17  Yes [name], MD  Wheat Dextrin (BENEFIBER DRINK MIX PO) Take 1 Dose by mouth daily. 1 tbsp    Yes [provider]  glipiZIDE (GLUCOTROL XL) 5 MG 24 hr tablet Take 5 mg by mouth every morning.  02/13/17   [provider]  sevelamer carbonate (RENVELA) 800 MG tablet Take 1,600-3,200 mg by mouth See admin instructions. Take 3200 mg after each meal and 1600 mg each snack    [provider]     Allergies:     Allergies  Allergen Reactions  . Metformin And Related Other (See Comments)    Diarrhea, bloody stool, upset stomach.      Physical Exam:   Vitals  Blood pressure 90/64, pulse (!) 39, temperature (!) 101.4 F (38.6 C), temperature source Rectal, resp. rate 14, height '6\' 2"'  (1.88 m), weight 87 kg, SpO2 (!) 84 %.  Physical Examination: General appearance -lethargic, ill-appearing  Mental status -intermittent episodes of disorientation, remains quite lethargic  eyes - sclera anicteric Neck - supple, no JVD elevation , Chest -diminished bilaterally right basilar rales/rhonchi noted  heart - S1 and S2 normal, irregularly irregular, heart rate around 120 Abdomen - soft, nontender, nondistended, no masses or organomegaly Neurological -lethargic, disoriented, neck  supple without rigidity, neuro exam limited due to altered mentation  extremities -left UE AV fistula with positive thrill and bruit , LE with  bilateral BKA, stumps do not look infected, please see photos in epic Skin - warm, dry----lower abdominal areas with ecchymosis/resolving bruise, please see photos in epic  [place]    04/02/2018 12:32  Attached To:  Hospital Encounter on 04/17/2018  Source Information   Roxan Hockey, MD  [facility]   Media Information   Document Information   Photos    03/27/2018 12:33  Attached To:  Hospital Encounter on 04/02/2018  Source Information   Roxan Hockey, MD  [facility]    Media Information   Document Information   Photos    03/28/2018 12:32  Attached To:  Hospital Encounter on 04/02/2018  Source Information   Roxan Hockey, MD  [facility]        Data Review:    CBC Recent Labs  Lab 04/12/18 0235 04/13/18 0742 04/14/18 0310 04/05/2018 0751  WBC 19.7* 19.7* 17.8* 19.3*  HGB 8.3* 9.0* 9.1* 7.9*  HCT 26.7* 29.7* 30.2* 29.5*  PLT 207 244 217 210  MCV 87.8 90.3 91.2 102.8*  MCH 27.3 27.4 27.5 27.5  MCHC 31.1 30.3 30.1 26.8*  RDW 15.6* 16.2* 16.3* 18.3*  LYMPHSABS  --   --   --  1.3  MONOABS  --   --   --  1.4*  EOSABS  --   --   --  0.1  BASOSABS  --   --   --  0.0   ------------------------------------------------------------------------------------------------------------------  Chemistries  Recent Labs  Lab 04/12/18 0043 04/13/18 0741 04/14/18 1325 04/01/2018 0751  NA 134* 136  --  138  K 5.6* 4.9  --  4.2  CL 95* 95*  --  102  CO2 21* 19*  --  18*  GLUCOSE 174* 169*  --  263*  BUN 64* 56*  --  57*  CREATININE 9.72* 8.54*  --  9.22*  CALCIUM 8.1* 8.6*  --  8.0*  AST  --   --  144* 99*  ALT  --   --  15 9  ALKPHOS  --   --  73 80  BILITOT  --   --  1.1 1.1   ------------------------------------------------------------------------------------------------------------------ estimated creatinine clearance is 9.4 mL/min (A) (by C-G formula based on SCr of 9.22 mg/dL  (H)). ------------------------------------------------------------------------------------------------------------------ No results for input(s): TSH, T4TOTAL, T3FREE, THYROIDAB in the last 72 hours.  Invalid input(s): FREET3   Coagulation profile Recent Labs  Lab 04/12/18 0235 04/13/18 1209 04/14/18 0310 04/13/2018 0751  INR 1.91 2.56 2.42 3.4*   ------------------------------------------------------------------------------------------------------------------- No results for input(s): DDIMER in the last 72 hours. -------------------------------------------------------------------------------------------------------------------  Cardiac Enzymes No results for input(s): CKMB, TROPONINI, MYOGLOBIN in the last 168 hours.  Invalid input(s): CK ------------------------------------------------------------------------------------------------------------------ No results found for: BNP   ---------------------------------------------------------------------------------------------------------------  Urinalysis    Component Value Date/Time   COLORURINE YELLOW 10/12/2013 [place] 10/12/2013 1045   LABSPEC 1.015 10/12/2013 1045   PHURINE 8.0 10/12/2013 1045   GLUCOSEU NEGATIVE 10/12/2013 1045   HGBUR MODERATE (A) 10/12/2013 1045   BILIRUBINUR NEGATIVE 10/12/2013 1045   KETONESUR NEGATIVE 10/12/2013 1045   PROTEINUR 30 (A) 10/12/2013 1045   UROBILINOGEN 1.0 10/12/2013 1045   NITRITE NEGATIVE 10/12/2013 1045   LEUKOCYTESUR NEGATIVE 10/12/2013 1045    ----------------------------------------------------------------------------------------------------------------   Imaging Results:    Ct Head Wo Contrast  Result Date: 04/15/2018 CLINICAL DATA:  Altered mental status. Hypotension. EXAM: CT HEAD WITHOUT CONTRAST TECHNIQUE: Contiguous axial images were obtained from the base of the skull through the vertex without intravenous contrast. COMPARISON:  12/24/2003.  FINDINGS: Brain: Interval mild enlargement of the subarachnoid spaces. Normal size and position of the ventricles. Minimal patchy white matter low density in both cerebral hemispheres. Small pituitary gland with no enlargement of the sella. No intracranial hemorrhage, mass lesion or CT evidence of acute infarction. Vascular: No hyperdense vessel or unexpected calcification. Skull: Normal. Negative for fracture or focal lesion. Sinuses/Orbits: Unremarkable. Other: None. IMPRESSION: 1. No acute abnormality. 2. Interval mild diffuse cerebral and cerebellar atrophy. 3. Minimal chronic small vessel white matter ischemic changes in both cerebral hemispheres. Electronically Signed   By: Claudie Revering M.D.   On: 04/02/2018 10:18   Dg Chest Port 1 View  Result Date: 04/15/2018 CLINICAL DATA:  Fever with hypoxia EXAM: PORTABLE CHEST 1 VIEW COMPARISON:  January 04, 2017 FINDINGS: There is focal airspace consolidation in the medial left base. There is atelectasis with apparent superimposed infiltrate in the right base. The lungs elsewhere are clear. Heart is borderline enlarged with pulmonary vascularity normal. No adenopathy. There is aortic atherosclerosis. Note bony bridge between the anterior right first and second ribs, a likely anatomic variant. There is degenerative change in the left shoulder. IMPRESSION: Patchy airspace consolidation in the lung bases with associated atelectasis in the right base. Heart is borderline enlarged. Aortic Atherosclerosis (ICD10-I70.0). Electronically Signed   By: Lowella Grip III M.D.   On: 04/08/2018 07:49    Radiological Exams on Admission: Ct Head Wo Contrast  Result Date: 04/13/2018 CLINICAL DATA:  Altered mental status. Hypotension. EXAM: CT HEAD WITHOUT CONTRAST TECHNIQUE: Contiguous axial images were obtained from the base of the skull through the vertex without intravenous contrast. COMPARISON:  12/24/2003. FINDINGS: Brain: Interval mild enlargement of the  subarachnoid spaces. Normal size and position of the ventricles. Minimal patchy white matter low density in both cerebral hemispheres. Small pituitary gland with no enlargement of the sella. No intracranial hemorrhage, mass lesion or CT evidence of acute infarction. Vascular: No hyperdense vessel or unexpected calcification. Skull: Normal. Negative for fracture or focal lesion. Sinuses/Orbits: Unremarkable. Other: None. IMPRESSION: 1. No acute abnormality. 2. Interval mild diffuse cerebral and cerebellar atrophy. 3. Minimal chronic small vessel white matter ischemic changes in both cerebral hemispheres. Electronically Signed   By: Claudie Revering M.D.   On: 03/28/2018 10:18   Dg Chest Port 1 View  Result Date: 03/30/2018 CLINICAL DATA:  Fever with hypoxia EXAM: PORTABLE CHEST 1 VIEW COMPARISON:  January 04, 2017 FINDINGS: There is focal airspace consolidation in the medial left base. There is atelectasis with apparent superimposed infiltrate in the right base. The lungs elsewhere are clear. Heart is borderline enlarged with pulmonary vascularity normal. No adenopathy. There is aortic atherosclerosis. Note bony bridge between the anterior right first and second ribs, a likely anatomic variant. There is degenerative change in the left shoulder. IMPRESSION: Patchy airspace consolidation in the lung bases with associated atelectasis in the right base. Heart is borderline enlarged. Aortic Atherosclerosis (ICD10-I70.0). Electronically Signed   By: Lowella Grip III M.D.   On: 04/06/2018 07:49    DVT Prophylaxis -SCD /Coumadin AM Labs Ordered, also please review Full Orders  Family Communication: Admission, patients condition and plan of care including tests being ordered have been discussed with the patient and daughter Verdie Drown) who indicate understanding and agree with the plan  Code Status - Full Code  Likely DC to  TBD  Condition   guarded  Roxan Hockey M.D on 04/19/2018 at 3:18 PM Go to  [email or web address] -  for contact info  [facility] - Office  [phone]

## 2018-04-18 NOTE — ED Notes (Signed)
ED Provider at bedside. 

## 2018-04-19 ENCOUNTER — Inpatient Hospital Stay (HOSPITAL_COMMUNITY): Payer: Medicare HMO

## 2018-04-19 DIAGNOSIS — N186 End stage renal disease: Secondary | ICD-10-CM

## 2018-04-19 DIAGNOSIS — Z992 Dependence on renal dialysis: Secondary | ICD-10-CM

## 2018-04-19 DIAGNOSIS — R6521 Severe sepsis with septic shock: Secondary | ICD-10-CM

## 2018-04-19 DIAGNOSIS — A419 Sepsis, unspecified organism: Principal | ICD-10-CM

## 2018-04-19 DIAGNOSIS — D649 Anemia, unspecified: Secondary | ICD-10-CM

## 2018-04-19 DIAGNOSIS — J9602 Acute respiratory failure with hypercapnia: Secondary | ICD-10-CM

## 2018-04-19 DIAGNOSIS — I4819 Other persistent atrial fibrillation: Secondary | ICD-10-CM

## 2018-04-19 LAB — BLOOD GAS, ARTERIAL
Acid-base deficit: 11.6 mmol/L — ABNORMAL HIGH (ref 0.0–2.0)
Bicarbonate: 13.5 mmol/L — ABNORMAL LOW (ref 20.0–28.0)
Drawn by: 280981
FIO2: 100
MECHVT: 640 mL
O2 SAT: 99.6 %
PEEP: 5 cmH2O
Patient temperature: 98.6
RATE: 16 resp/min
pCO2 arterial: 27.8 mmHg — ABNORMAL LOW (ref 32.0–48.0)
pH, Arterial: 7.308 — ABNORMAL LOW (ref 7.350–7.450)
pO2, Arterial: 284 mmHg — ABNORMAL HIGH (ref 83.0–108.0)

## 2018-04-19 LAB — CBC
HCT: 32.8 % — ABNORMAL LOW (ref 39.0–52.0)
Hemoglobin: 9.1 g/dL — ABNORMAL LOW (ref 13.0–17.0)
MCH: 27.7 pg (ref 26.0–34.0)
MCHC: 27.7 g/dL — ABNORMAL LOW (ref 30.0–36.0)
MCV: 99.7 fL (ref 80.0–100.0)
NRBC: 1.7 % — AB (ref 0.0–0.2)
Platelets: 205 10*3/uL (ref 150–400)
RBC: 3.29 MIL/uL — ABNORMAL LOW (ref 4.22–5.81)
RDW: 18.3 % — ABNORMAL HIGH (ref 11.5–15.5)
WBC: 29.7 10*3/uL — ABNORMAL HIGH (ref 4.0–10.5)

## 2018-04-19 LAB — RENAL FUNCTION PANEL
Albumin: 1.5 g/dL — ABNORMAL LOW (ref 3.5–5.0)
Anion gap: 21 — ABNORMAL HIGH (ref 5–15)
BUN: 66 mg/dL — ABNORMAL HIGH (ref 8–23)
CO2: 15 mmol/L — ABNORMAL LOW (ref 22–32)
Calcium: 8.5 mg/dL — ABNORMAL LOW (ref 8.9–10.3)
Chloride: 98 mmol/L (ref 98–111)
Creatinine, Ser: 11.11 mg/dL — ABNORMAL HIGH (ref 0.61–1.24)
GFR calc Af Amer: 5 mL/min — ABNORMAL LOW (ref >60)
GFR calc non Af Amer: 4 mL/min — ABNORMAL LOW (ref >60)
Glucose, Bld: 287 mg/dL — ABNORMAL HIGH (ref 70–99)
Phosphorus: 6.9 mg/dL — ABNORMAL HIGH (ref 2.5–4.6)
Potassium: 5.2 mmol/L — ABNORMAL HIGH (ref 3.5–5.1)
Sodium: 134 mmol/L — ABNORMAL LOW (ref 135–145)

## 2018-04-19 LAB — RESPIRATORY PANEL BY PCR
ADENOVIRUS-RVPPCR: NOT DETECTED
Bordetella pertussis: NOT DETECTED
Chlamydophila pneumoniae: NOT DETECTED
Coronavirus 229E: NOT DETECTED
Coronavirus HKU1: NOT DETECTED
Coronavirus NL63: NOT DETECTED
Coronavirus OC43: NOT DETECTED
Influenza A: NOT DETECTED
Influenza B: NOT DETECTED
Metapneumovirus: NOT DETECTED
Mycoplasma pneumoniae: NOT DETECTED
PARAINFLUENZA VIRUS 4-RVPPCR: NOT DETECTED
Parainfluenza Virus 1: NOT DETECTED
Parainfluenza Virus 2: NOT DETECTED
Parainfluenza Virus 3: NOT DETECTED
Respiratory Syncytial Virus: NOT DETECTED
Rhinovirus / Enterovirus: NOT DETECTED

## 2018-04-19 LAB — PREPARE RBC (CROSSMATCH)

## 2018-04-19 LAB — GLUCOSE, CAPILLARY
Glucose-Capillary: 223 mg/dL — ABNORMAL HIGH (ref 70–99)
Glucose-Capillary: 247 mg/dL — ABNORMAL HIGH (ref 70–99)
Glucose-Capillary: 247 mg/dL — ABNORMAL HIGH (ref 70–99)
Glucose-Capillary: 251 mg/dL — ABNORMAL HIGH (ref 70–99)
Glucose-Capillary: 267 mg/dL — ABNORMAL HIGH (ref 70–99)
Glucose-Capillary: 283 mg/dL — ABNORMAL HIGH (ref 70–99)

## 2018-04-19 LAB — PROTIME-INR
INR: 2.3 — ABNORMAL HIGH (ref 0.8–1.2)
PROTHROMBIN TIME: 25 s — AB (ref 11.4–15.2)

## 2018-04-19 MED ORDER — PHENYLEPHRINE HCL-NACL 40-0.9 MG/250ML-% IV SOLN
0.0000 ug/min | INTRAVENOUS | Status: DC
  Administered 2018-04-19: 160 ug/min via INTRAVENOUS
  Administered 2018-04-19: 200 ug/min via INTRAVENOUS
  Administered 2018-04-20: 400 ug/min via INTRAVENOUS
  Administered 2018-04-20: 210 ug/min via INTRAVENOUS
  Administered 2018-04-20: 230 ug/min via INTRAVENOUS
  Filled 2018-04-19 (×6): qty 250

## 2018-04-19 MED ORDER — FENTANYL CITRATE (PF) 100 MCG/2ML IJ SOLN
INTRAMUSCULAR | Status: AC
  Administered 2018-04-19: 100 ug via INTRAVENOUS
  Filled 2018-04-19: qty 2

## 2018-04-19 MED ORDER — CHLORHEXIDINE GLUCONATE CLOTH 2 % EX PADS
6.0000 | MEDICATED_PAD | Freq: Every day | CUTANEOUS | Status: DC
  Administered 2018-04-19 – 2018-04-20 (×2): 6 via TOPICAL

## 2018-04-19 MED ORDER — MIDAZOLAM HCL 2 MG/2ML IJ SOLN: 1.0000 mg | INTRAMUSCULAR | Status: DC | PRN

## 2018-04-19 MED ORDER — FENTANYL CITRATE (PF) 100 MCG/2ML IJ SOLN
50.0000 ug | INTRAMUSCULAR | Status: DC | PRN
  Administered 2018-04-19: 100 ug via INTRAVENOUS

## 2018-04-19 MED ORDER — METOPROLOL TARTRATE 12.5 MG HALF TABLET
50.0000 mg | ORAL_TABLET | Freq: Two times a day (BID) | ORAL | Status: DC
  Filled 2018-04-19: qty 1

## 2018-04-19 MED ORDER — ORAL CARE MOUTH RINSE
15.0000 mL | OROMUCOSAL | Status: DC
  Administered 2018-04-19 – 2018-04-20 (×8): 15 mL via OROMUCOSAL

## 2018-04-19 MED ORDER — FENTANYL 2500MCG IN NS 250ML (10MCG/ML) PREMIX INFUSION
25.0000 ug/h | INTRAVENOUS | Status: DC
  Administered 2018-04-19: 50 ug/h via INTRAVENOUS
  Filled 2018-04-19 (×2): qty 250

## 2018-04-19 MED ORDER — SODIUM CHLORIDE 0.9 % IV SOLN
2.0000 g | Freq: Once | INTRAVENOUS | Status: DC
  Filled 2018-04-19: qty 2

## 2018-04-19 MED ORDER — ETOMIDATE 2 MG/ML IV SOLN
20.0000 mg | Freq: Once | INTRAVENOUS | Status: AC
  Administered 2018-04-19: 20 mg via INTRAVENOUS

## 2018-04-19 MED ORDER — DILTIAZEM HCL-DEXTROSE 100-5 MG/100ML-% IV SOLN (PREMIX)
5.0000 mg/h | INTRAVENOUS | Status: DC
  Administered 2018-04-19: 5 mg/h via INTRAVENOUS
  Filled 2018-04-19: qty 100

## 2018-04-19 MED ORDER — DILTIAZEM HCL 25 MG/5ML IV SOLN
15.0000 mg | Freq: Once | INTRAVENOUS | Status: AC
  Administered 2018-04-19: 15 mg via INTRAVENOUS
  Filled 2018-04-19: qty 5

## 2018-04-19 MED ORDER — DOXERCALCIFEROL 4 MCG/2ML IV SOLN: 3.5000 ug | INTRAVENOUS | Status: DC

## 2018-04-19 MED ORDER — DILTIAZEM HCL ER COATED BEADS 180 MG PO CP24: 180.0000 mg | ORAL_CAPSULE | Freq: Every day | ORAL | Status: DC

## 2018-04-19 MED ORDER — AMIODARONE HCL IN DEXTROSE 360-4.14 MG/200ML-% IV SOLN
30.0000 mg/h | INTRAVENOUS | Status: DC
  Administered 2018-04-19 – 2018-04-20 (×2): 30 mg/h via INTRAVENOUS
  Filled 2018-04-19 (×3): qty 200

## 2018-04-19 MED ORDER — NOREPINEPHRINE 4 MG/250ML-% IV SOLN
INTRAVENOUS | Status: AC
  Filled 2018-04-19: qty 250

## 2018-04-19 MED ORDER — ROCURONIUM BROMIDE 50 MG/5ML IV SOLN
50.0000 mg | Freq: Once | INTRAVENOUS | Status: AC
  Administered 2018-04-19: 50 mg via INTRAVENOUS

## 2018-04-19 MED ORDER — VANCOMYCIN HCL IN DEXTROSE 1-5 GM/200ML-% IV SOLN: 1000.0000 mg | INTRAVENOUS | Status: DC

## 2018-04-19 MED ORDER — PHENYLEPHRINE HCL-NACL 10-0.9 MG/250ML-% IV SOLN
0.0000 ug/min | INTRAVENOUS | Status: DC
  Administered 2018-04-19: 20 ug/min via INTRAVENOUS
  Administered 2018-04-19 (×2): 100 ug/min via INTRAVENOUS
  Filled 2018-04-19 (×4): qty 250

## 2018-04-19 MED ORDER — SODIUM CHLORIDE 0.9% IV SOLUTION
Freq: Once | INTRAVENOUS | Status: AC
  Administered 2018-04-19: 16:00:00 via INTRAVENOUS

## 2018-04-19 MED ORDER — AMIODARONE HCL IN DEXTROSE 360-4.14 MG/200ML-% IV SOLN
60.0000 mg/h | INTRAVENOUS | Status: DC
  Administered 2018-04-19 (×2): 60 mg/h via INTRAVENOUS
  Filled 2018-04-19: qty 200

## 2018-04-19 MED ORDER — AMIODARONE HCL IN DEXTROSE 360-4.14 MG/200ML-% IV SOLN
INTRAVENOUS | Status: AC
  Administered 2018-04-19: 60 mg/h via INTRAVENOUS
  Filled 2018-04-19: qty 200

## 2018-04-19 MED ORDER — DARBEPOETIN ALFA 100 MCG/0.5ML IJ SOSY: 100.0000 ug | PREFILLED_SYRINGE | INTRAMUSCULAR | Status: DC

## 2018-04-19 MED ORDER — FENTANYL CITRATE (PF) 100 MCG/2ML IJ SOLN
50.0000 ug | INTRAMUSCULAR | Status: DC | PRN
  Administered 2018-04-19 (×2): 50 ug via INTRAVENOUS
  Filled 2018-04-19 (×2): qty 2

## 2018-04-19 MED ORDER — NOREPINEPHRINE 4 MG/250ML-% IV SOLN: 0.0000 ug/min | INTRAVENOUS | Status: DC

## 2018-04-19 MED ORDER — HEPARIN SODIUM (PORCINE) 1000 UNIT/ML IJ SOLN
2.4000 mL | Freq: Once | INTRAMUSCULAR | Status: AC
  Administered 2018-04-19: 2400 [IU]
  Filled 2018-04-19: qty 3

## 2018-04-19 MED ORDER — MIDAZOLAM HCL 2 MG/2ML IJ SOLN
1.0000 mg | INTRAMUSCULAR | Status: DC | PRN
  Administered 2018-04-19: 1 mg via INTRAVENOUS
  Filled 2018-04-19: qty 2

## 2018-04-19 MED ORDER — AMIODARONE LOAD VIA INFUSION
150.0000 mg | Freq: Once | INTRAVENOUS | Status: AC
  Administered 2018-04-19: 150 mg via INTRAVENOUS
  Filled 2018-04-19: qty 83.34

## 2018-04-19 MED ORDER — ASPIRIN 81 MG PO CHEW
81.0000 mg | CHEWABLE_TABLET | Freq: Every day | ORAL | Status: DC
  Administered 2018-04-20: 81 mg via ORAL
  Filled 2018-04-19: qty 1

## 2018-04-19 MED ORDER — MIDAZOLAM HCL 2 MG/2ML IJ SOLN
INTRAMUSCULAR | Status: AC
  Administered 2018-04-19: 2 mg
  Filled 2018-04-19: qty 2

## 2018-04-19 MED ORDER — DILTIAZEM LOAD VIA INFUSION: 15.0000 mg | Freq: Once | INTRAVENOUS | Status: DC

## 2018-04-19 MED ORDER — SODIUM CHLORIDE 0.9 % IV BOLUS
1000.0000 mL | Freq: Once | INTRAVENOUS | Status: AC
  Administered 2018-04-19: 1000 mL via INTRAVENOUS

## 2018-04-19 MED ORDER — DILTIAZEM HCL 25 MG/5ML IV SOLN
10.0000 mg | Freq: Once | INTRAVENOUS | Status: AC
  Administered 2018-04-19: 10 mg via INTRAVENOUS
  Filled 2018-04-19: qty 5

## 2018-04-19 MED ORDER — CHLORHEXIDINE GLUCONATE 0.12% ORAL RINSE (MEDLINE KIT)
15.0000 mL | Freq: Two times a day (BID) | OROMUCOSAL | Status: DC
  Administered 2018-04-19 – 2018-04-20 (×2): 15 mL via OROMUCOSAL

## 2018-04-19 NOTE — Significant Event (Addendum)
Rapid Response Event Note  Overview:  Called to room by Code stroke page Time Called: [place] Time: [id number] Event Type: Neurologic, Other (Comment)  Initial Focused Assessment: Supine in bed - hot and moist skin - leaning to left side - will raise eyebrows to name and DPS but decreased LOC from this AM when I saw him - he feels feverish now - has tone in both arms but seems equally weak - not following commands at this point - RN Melynn reports continued attempt to control HR with Cardizem per orders - had some hypotensive events during. Worsening encephalopathy and PCCM was consulted by Dr. Bevely Palmer.  PCCM at bedside - concerned with right side weakness and code stroke was called. Also concerned with worsening LOC and airway protection.  Dr. Rory Percy and myself to bedside with code stroke call. Dr. Nelda Marseille present - spoke with him- agreed to take patient to CT scan to rule out bleed - then to ICU for intubation prior to any further neuro imaging.    Interventions:  CBG 267.  BP 91/58 HR 111 afib.  Wife at bedside -she reports his mental status was not normal 2 days ago - had been deteroriating since surgery 2 weeks ago.  To CT scan with ambu bag and RT - full monitor.  Patient tol CT - Dr. Rory Percy at bedside. A little more awake - trying to focus - moves both arms.  Transferred to ICU - handoff to [facility] RN.  Code stroke cancelled per Dr. Rory Percy.   Plan of Care (if not transferred):  Event Summary: Name of Physician Notified: Dr. Marvel Plan at (pta RRT)  Name of Consulting Physician Notified: Dr. Nelda Marseille  at (at bedside -called code stroke)  Outcome: Transferred (Comment)(2M07)  Event End Time: 1530  Quin Hoop

## 2018-04-19 NOTE — Significant Event (Signed)
Rapid Response Event Note  Overview:  Follow up on patient from last night call Time Called: 0930 Arrival Time: 0950 Event Type: Other (Comment)  Initial Focused Assessment:  Sitting upright in bed. Arouses to loud voice - skin warm and dry  - follows few simple commands - asking for water - able to sip liquids without problems - some extremity twitching noted - no clonic-tonic movement - RN reports EEG was completed few minutes ago - afib rate 130 - BP 163/95 RR 18 - O2 sats 94%. Dr. Marvel Plan has just rounded - orders for Cardizem for HR control.  WBC 19K - presumed PNA per notes.  CBG 247. Was admitted with AMS - reports of fluctuating mental status x 36 hours.    Interventions:  Cardizem 10 mg IV over 5 minutes given per [facility] RN. Patient able to swallow morning pills without problems.  Continues to follow simple commands - coughs but weakly to command - denies pain.  Abd soft bruising noted LLQ - RN states that this is not new.  Bil AKA - incisions clean and intact.    Plan of Care (if not transferred):  Plan per MD - get HR down then HD - patient has missed HD on Tuesday - frequent vital signs during HR control meds.  Will follow as needed. Handoff to [facility] RN.    Event Summary: Name of Physician Notified: Dr. Marvel Plan at (pta RRT)    at    Outcome: Stayed in room and stabalized  Event End Time: [address], Nena Jordan

## 2018-04-19 NOTE — Consult Note (Signed)
[facility] Renal Consultation Note    Indication for Consultation:  Management of ESRD/hemodialysis; anemia, hypertension/volume and secondary hyperparathyroidism  HPI: Albert [name] is a 63 y.o. male.  Mr. Marchetta has an extensive PMH significant for HTN, atrial fib on chronic anticoagulation, DM, NICMP, h/o renal cell carcinoma s/p nephrectomy, ESRD, and PAD who had an extended hospitalization at [facility] from 1/28-2/21/20 following bilateral BKA's due to dry gangrene and severe vascular insufficiency.  We followed Mr. Papadopoulos for his dialysis needs during that hospitalization and are familiar with him.  He was discharged to a SNF but was brought to [facility] ED on 04/05/2018 with AMS and lower than normal BP.  His SBP was in the 70's and given IVF's.  He was disoriented and hypoxic with pulse ox of 85% on room air.  CXR revealed a patchy airspace consolidation in the lung bases and was admitted to [facility] for further evaluation and management.  We were consulted to help provide HD and manage his ESRD-related disease processes during this hospitalization.   Past Medical History:  Diagnosis Date  . Anemia   . Atrial fibrillation ([place])   . Cardiomyopathy    LVEF 50-55% 8/11 - [facility]  . Colonic polyp   . Diverticulosis of colon   . ESRD on hemodialysis ([place])    Dr. Lowanda Foster [facility]  . Essential hypertension, benign   . Gout   . Mixed hyperlipidemia   . Neuropathy    in both feet  . OSA (obstructive sleep apnea)    CPAP  . Renal cell cancer ([place])   . Type 2 diabetes mellitus ([place])    Type II   Past Surgical History:  Procedure Laterality Date  . ABDOMINAL AORTOGRAM W/LOWER EXTREMITY N/A 03/27/2018   Procedure: ABDOMINAL AORTOGRAM W/LOWER EXTREMITY;  Surgeon: Waynetta Sandy, MD;  Location: [place] [facility];  Service: Cardiovascular;  Laterality: N/A;  . AMPUTATION Bilateral 03/29/2018   Procedure: RESECTION TRANSMETATARSAL - LEFT SECOND TOE OF LEFT FOOT TOE  RESECTION TRANSMETATARSAL FIRST ,SECOND,THIRD TOE RIGHT FOOT REMOVAL SECOND TOE GREAT TOE ,THIRD TOE;  Surgeon: Elam Dutch, MD;  Location: [place] OR;  Service: Vascular;  Laterality: Bilateral;  . AMPUTATION Left 04/08/2018   Procedure: LEFT BELOW KNEE AMPUTATION;  Surgeon: Elam Dutch, MD;  Location: [place];  Service: Vascular;  Laterality: Left;  . AMPUTATION Right 04/11/2018   Procedure: RIGHT BELOW KNEE AMPUTATION;  Surgeon: Waynetta Sandy, MD;  Location: [place];  Service: Vascular;  Laterality: Right;  . BIOPSY  01/05/2017   Procedure: BIOPSY;  Surgeon: Daneil Dolin, MD;  [facility];  Service: Endoscopy;;  gastric  . COLONOSCOPY N/A 01/07/2016   Dr. [name]: Grade 1 internal hemorrhoids, otherwise normal  . COLONOSCOPY W/ POLYPECTOMY  2009, 2012   2009: Dr. [name]: 1.25 cm adenomatous polyp without high grade dysplasia, distal to IC valve. 2012: normal rectum, few pancolonic diverticula, single diminutive hyperplastic polyp  . ESOPHAGOGASTRODUODENOSCOPY  2009   Dr. [name]: accentuated undulating Z-lin with couple of islands of salmon-colored epithelium suspicious for Barrett's, s/p biopsy with path revealing mild inflammation, no metaplasia, dysplasia, or malignancy  . ESOPHAGOGASTRODUODENOSCOPY N/A 01/05/2017   Procedure: ESOPHAGOGASTRODUODENOSCOPY (EGD);  Surgeon: Daneil Dolin, MD;  [facility];  Service: Endoscopy;  Laterality: N/A;  . EUS N/A 04/21/2017   Procedure: UPPER ENDOSCOPIC ULTRASOUND (EUS) LINEAR;  Surgeon: Milus Banister, MD;  Location: [facility];  Service: Endoscopy;  Laterality: N/A;  . FISTULA SUPERFICIALIZATION  Left 03/31/2015   Procedure: CEPHALIC VEIN TURNDOWN; PLICATION OF BRACHIOCEPHALIC AVF;  Surgeon: Conrad C[place]ahoga, MD;  Location: [place];  Service: Vascular;  Laterality: Left;  . FISTULOGRAM N/A 10/19/2013   Procedure: FISTULOGRAM;  Surgeon: Rosetta Posner, MD;  Location: [facility] CATH LAB;  Service: Cardiovascular;  Laterality: N/A;   . Left arm AV fistula  2009   Dr. [name]  . PERIPHERAL VASCULAR BALLOON ANGIOPLASTY  03/27/2018   Procedure: PERIPHERAL VASCULAR BALLOON ANGIOPLASTY;  Surgeon: Waynetta Sandy, MD;  Location: [place] [facility];  Service: Cardiovascular;;  LT PT  . PERIPHERAL VASCULAR CATHETERIZATION N/A 03/10/2015   Procedure: Fistulagram;  Surgeon: Conrad Cullman, MD;  Location: [place] [facility];  Service: Cardiovascular;  Laterality: N/A;  . Right nephrectomy     Family History:   Family History  Problem Relation Age of Onset  . Hypertension Mother   . Hypertension Other        Siblings  . Diabetes type II Other        Siblings  . Colon cancer Neg Hx    Social History:  reports that he quit smoking about 19 years ago. His smoking use included cigarettes. He has never used smokeless tobacco. He reports that he does not drink alcohol or use drugs. Allergies  Allergen Reactions  . Metformin And Related Other (See Comments)    Diarrhea, bloody stool, upset stomach.    Prior to Admission medications   Medication Sig Start Date End Date Taking? Authorizing Provider  allopurinol (ZYLOPRIM) 100 MG tablet Take 100 mg by mouth daily.    Yes [provider]  amiodarone (PACERONE) 200 MG tablet Take 1 tablet (200 mg total) by mouth daily. 04/15/18  Yes Caren Griffins, MD  aspirin EC 81 MG EC tablet Take 1 tablet (81 mg total) by mouth daily. 04/15/18  Yes Caren Griffins, MD  cinacalcet (SENSIPAR) 30 MG tablet Take 30 mg by mouth every evening.    Yes [provider]  diltiazem (CARDIZEM CD) 180 MG 24 hr capsule TAKE 1 CAPSULE EVERY DAY Patient taking differently: Take 180 mg by mouth daily.  03/07/18  Yes [name], MD  docusate sodium (COLACE) 100 MG capsule Take 100 mg by mouth daily.   Yes [provider]  fenofibrate 160 MG tablet Take 160 mg by mouth at bedtime.    Yes [provider]  fish oil-omega-3 fatty acids 1000 MG capsule Take 1 g by  mouth 3 (three) times daily.    Yes [provider]  gabapentin (NEURONTIN) 300 MG capsule Take 300 mg by mouth 3 (three) times daily.    Yes [provider]  glipiZIDE (GLUCOTROL) 5 MG tablet Take 5 mg by mouth daily before breakfast.   Yes [provider]  metoprolol tartrate (LOPRESSOR) 50 MG tablet Take 1 tablet (50 mg total) by mouth 2 (two) times daily. 04/14/18  Yes Gherghe, Vella Redhead, MD  midodrine (PROAMATINE) 10 MG tablet Take 1 tablet (10 mg total) by mouth 3 (three) times daily with meals. 04/14/18  Yes Gherghe, Vella Redhead, MD  oxyCODONE (OXY IR/ROXICODONE) 5 MG immediate release tablet Take 1-2 tablets (5-10 mg total) by mouth every 4 (four) hours as needed for severe pain. Patient taking differently: Take 5-10 mg by mouth every 4 (four) hours as needed for moderate pain.  04/14/18  Yes Caren Griffins, MD  pantoprazole (PROTONIX) 40 MG tablet Take 1 tablet (40 mg total) by mouth daily.  Patient taking differently: Take 40 mg by mouth every evening.  04/05/17  Yes Virl Cagey, MD  pravastatin (PRAVACHOL) 10 MG tablet Take 10 mg by mouth at bedtime.    Yes [provider]  rosuvastatin (CRESTOR) 5 MG tablet Take 5 mg by mouth daily.   Yes [provider]  TRADJENTA 5 MG TABS tablet Take 5 mg by mouth every morning.  04/13/17  Yes [provider]  warfarin (COUMADIN) 2.5 MG tablet TAKE 1 TABLET EVERY DAY EXCEPT TAKE  1/2 TABLET ON SUNDAYS, [place] OR AS DIRECTED Patient taking differently: Take 2.5 mg by mouth See admin instructions. Take 2.5 mg daily on Monday, Wednesday, Friday, and Saturday. 02/23/17  Yes [name], MD  Wheat Dextrin (BENEFIBER DRINK MIX PO) Take 1 Dose by mouth daily. 1 tbsp    Yes [provider]  glipiZIDE (GLUCOTROL XL) 5 MG 24 hr tablet Take 5 mg by mouth every morning.  02/13/17   [provider]  sevelamer carbonate (RENVELA) 800 MG tablet Take 1,600-3,200 mg by mouth  See admin instructions. Take 3200 mg after each meal and 1600 mg each snack    [provider]   Current Facility-Administered Medications  Medication Dose Route Frequency Provider Last Rate Last Dose  . 0.9 %  sodium chloride infusion (Manually program via Guardrails IV Fluids)   Intravenous Once Elgergawy, Dawood S, MD      . 0.9 %  sodium chloride infusion  250 mL Intravenous PRN Emokpae, Courage, MD      . acetaminophen (TYLENOL) tablet 650 mg  650 mg Oral Q6H PRN Emokpae, Courage, MD       Or  . acetaminophen (TYLENOL) suppository 650 mg  650 mg Rectal Q6H PRN Emokpae, Courage, MD      . albuterol (PROVENTIL) (2.5 MG/3ML) 0.083% nebulizer solution 2.5 mg  2.5 mg Nebulization Q2H PRN Emokpae, Courage, MD      . amiodarone (PACERONE) tablet 200 mg  200 mg Oral Daily Emokpae, Courage, MD   200 mg at 04/19/18 1006  . aspirin EC tablet 81 mg  81 mg Oral Daily Emokpae, Courage, MD   81 mg at 04/19/18 1006  . Chlorhexidine Gluconate Cloth 2 % PADS 6 each  6 each Topical Q0600 Donato Heinz, MD   6 each at 04/19/18 1004  . diltiazem (CARDIZEM) 100 mg in dextrose 5% 156mL (1 mg/mL) infusion  5-15 mg/hr Intravenous Titrated Elgergawy, Dawood S, MD      . insulin aspart (novoLOG) injection 0-6 Units  0-6 Units Subcutaneous TID WC Emokpae, Courage, MD      . midodrine (PROAMATINE) tablet 10 mg  10 mg Oral TID WC Emokpae, Courage, MD   10 mg at 04/19/18 1001  . ondansetron (ZOFRAN) tablet 4 mg  4 mg Oral Q6H PRN Emokpae, Courage, MD       Or  . ondansetron (ZOFRAN) injection 4 mg  4 mg Intravenous Q6H PRN Emokpae, Courage, MD      . sodium chloride flush (NS) 0.9 % injection 3 mL  3 mL Intravenous Q12H Emokpae, Courage, MD   3 mL at 04/19/18 1004  . sodium chloride flush (NS) 0.9 % injection 3 mL  3 mL Intravenous PRN Roxan Hockey, MD       Labs: Basic Metabolic Panel: Recent Labs  Lab 04/13/18 0741 03/27/2018 0751  NA 136 138  K 4.9 4.2  CL 95* 102  CO2 19* 18*  GLUCOSE 169*  263*  BUN 56* 57*  CREATININE 8.54* 9.22*  CALCIUM 8.6* 8.0*  PHOS 6.2*  --    Liver Function Tests: Recent Labs  Lab 04/13/18 0741 04/14/18 1325 04/13/2018 0751  AST  --  144* 99*  ALT  --  15 9  ALKPHOS  --  73 80  BILITOT  --  1.1 1.1  PROT  --  6.4* 5.6*  ALBUMIN 1.7* 1.9* 1.8*   No results for input(s): LIPASE, AMYLASE in the last 168 hours. Recent Labs  Lab 03/29/2018 1629  AMMONIA 18   CBC: Recent Labs  Lab 04/13/18 0742 04/14/18 0310 04/14/2018 0751  WBC 19.7* 17.8* 19.3*  NEUTROABS  --   --  16.3*  HGB 9.0* 9.1* 7.9*  HCT 29.7* 30.2* 29.5*  MCV 90.3 91.2 102.8*  PLT 244 217 210   Cardiac Enzymes: No results for input(s): CKTOTAL, CKMB, CKMBINDEX, TROPONINI in the last 168 hours. CBG: Recent Labs  Lab 03/30/2018 1855 03/26/2018 1927 03/29/2018 2111 04/19/18 0824 04/19/18 1138  GLUCAP 237* 251* 245* 247* 247*   Iron Studies: No results for input(s): IRON, TIBC, TRANSFERRIN, FERRITIN in the last 72 hours. Studies/Results: Dg Chest 2 View  Result Date: 04/19/2018 CLINICAL DATA:  Dyspnea. EXAM: CHEST - 2 VIEW COMPARISON:  Radiograph of April 18, 2018. FINDINGS: Stable cardiomegaly. Atherosclerosis of thoracic aorta is noted. No pneumothorax is noted. Hypoinflation of the lungs is noted with mild bibasilar subsegmental atelectasis. No significant pleural effusion is noted. Bony thorax is unremarkable. IMPRESSION: Hypoinflation of the lungs with mild bibasilar subsegmental atelectasis. Aortic Atherosclerosis (ICD10-I70.0). Electronically Signed   By: Marijo Conception, M.D.   On: 04/19/2018 10:38   Ct Head Wo Contrast  Result Date: 04/13/2018 CLINICAL DATA:  Altered mental status. Hypotension. EXAM: CT HEAD WITHOUT CONTRAST TECHNIQUE: Contiguous axial images were obtained from the base of the skull through the vertex without intravenous contrast. COMPARISON:  12/24/2003. FINDINGS: Brain: Interval mild enlargement of the subarachnoid spaces. Normal size and position of  the ventricles. Minimal patchy white matter low density in both cerebral hemispheres. Small pituitary gland with no enlargement of the sella. No intracranial hemorrhage, mass lesion or CT evidence of acute infarction. Vascular: No hyperdense vessel or unexpected calcification. Skull: Normal. Negative for fracture or focal lesion. Sinuses/Orbits: Unremarkable. Other: None. IMPRESSION: 1. No acute abnormality. 2. Interval mild diffuse cerebral and cerebellar atrophy. 3. Minimal chronic small vessel white matter ischemic changes in both cerebral hemispheres. Electronically Signed   By: Claudie Revering M.D.   On: 04/19/2018 10:18   Dg Chest Port 1 View  Result Date: 04/19/2018 CLINICAL DATA:  Fever with hypoxia EXAM: PORTABLE CHEST 1 VIEW COMPARISON:  January 04, 2017 FINDINGS: There is focal airspace consolidation in the medial left base. There is atelectasis with apparent superimposed infiltrate in the right base. The lungs elsewhere are clear. Heart is borderline enlarged with pulmonary vascularity normal. No adenopathy. There is aortic atherosclerosis. Note bony bridge between the anterior right first and second ribs, a likely anatomic variant. There is degenerative change in the left shoulder. IMPRESSION: Patchy airspace consolidation in the lung bases with associated atelectasis in the right base. Heart is borderline enlarged. Aortic Atherosclerosis (ICD10-I70.0). Electronically Signed   By: Lowella Grip III M.D.   On: 03/31/2018 07:49    ROS: Review of systems not obtained due to patient factors. Physical Exam: Vitals:   04/19/18 0740 04/19/18 0953 04/19/18 1037 04/19/18 1143  BP: (!) 102/46 (!) 163/95 135/70 (!) 122/110  Pulse: (!) 122  100  Resp: 18 19 (!) 22 (!) 30  Temp: 98.1 F (36.7 C)   97.8 F (36.6 C)  TempSrc: Axillary   Axillary  SpO2: 100%     Weight:      Height:          Weight change:   Intake/Output Summary (Last 24 hours) at 04/19/2018 1230 Last data filed at  04/19/2018 0200 Gross per 24 hour  Intake 500 ml  Output -  Net 500 ml   BP (!) 122/110 (BP Location: Right Wrist)   Pulse 100   Temp 97.8 F (36.6 C) (Axillary)   Resp (!) 30   Ht 6\' 2"  (1.88 m)   Wt 91.2 kg   SpO2 100%   BMI 25.81 kg/m  General appearance: delirious, fatigued and oriented only to person, thinks he is at home Head: Normocephalic, without obvious abnormality, atraumatic Resp: diminished breath sounds bibasilar and rhonchi RLL Cardio: irregularly irregular rhythm and tachycardic in the 120's-130's GI: soft, non-tender; bowel sounds normal; no masses,  no organomegaly and ecchymosis in the suprapubic region Extremities: s/p bilateral BKA's with staples in place, no drainage, LUE AVF +T/B Dialysis Access:  Dialysis Orders: Center: [facility]  on TTS . EDW 97kg HD Bath 2K/2.5Ca  Time  4 hours 15 minutes Heparin none. Access LUE AVF BFR 300 DFR 600    Hectoral 3.5 mcg IV/HD   Assessment/Plan: 1.  Sepsis from HCAP- pt with AMS, and hypotension and transferred from [facility] to [facility]. 2.  ESRD -   Will plan for HD today off of his normal schedule as BP tolerates.  Will get back on schedule later this week.  3. Atrial fibrillation with RVR- HD initially on hold due to HR in the 130's but improved with IV dilt.  4. AMS- presumably metabolic encephalopathy related to hypoxia, sepsis, and hypotension.  Continue to follow as we treat the underlying issues.  5. Hypertension/volume  - plan for HD today.  Improved BP with rate control and abx for PNA 6.  Anemia  - drop in Hgb and agree with blood transfusion with HD will add ESA as well. 7.  Metabolic bone disease -  Continue with meds when able to take po 8.  Nutrition -  Renal diet when able to take po 9. PVD s/p bilateral BKA's- some duskiness along incisions but no drainage or erythema  Donetta Potts, MD [place] Pager [phone] 04/19/2018, 12:30 PM

## 2018-04-19 NOTE — Consult Note (Signed)
Neurology Consultation  Reason for Consult: Inpatient code stroke-altered mental status, possible right-sided weakness. Referring Physician: Dr. Nelda Marseille  CC: Altered mental status, right-sided weakness  History is obtained from: Chart, later patient started.  HPI: Albert [name] is a 63 y.o. male past medical history of atrial fibrillation on Coumadin, recently admitted for septic shock secondary to right-sided pneumonia/H CAP, recent BKA, history of essential hypertension and possible orthostatic hypotension-on midodrine and metoprolol/Cardizem, end-stage renal disease on dialysis, diabetes, sleep apnea, admitted to the hospital for altered mental status since 04/10/2018, noted to be more altered and possibly weaker on the right side compared to the left this afternoon. The patient continues to be altered since admission, which was deemed to be secondary to toxic metabolic encephalopathy in the setting of pneumonia/H CAP as well as deranged renal function, became more encephalopathic today and pulmonary critical care medicine was consulted as he might need intubation and management of his toxic metabolic encephalopathy.  On examination by PCCM, there was concern for right-sided weakness and hence a code stroke was called.  According to the rep response team who is familiar with the patient since admission, he has had a waxing waning mental status ever since admission. I have canceled the acute code stroke because the last known normal is at least 2 days ago and the patient is not a candidate for any acute interventions at this time because of that. Patient daughter said that he has been unwell and declining in his mentation ever since his surgery.  He was coherent and cognizant prior to the surgery but right after he had the BKA, he had started to go down in terms of his cognition. No acute changes noted by the daughter.  This has been a subacute kind of progression.   LKW: 2 days ago tpa  given?: no, nonfocal exam, outside the window Premorbid modified Rankin scale (mRS): 4  ROS: ROS was performed and is negative except as noted in the HPI.   Past Medical History:  Diagnosis Date  . Anemia   . Atrial fibrillation ([place])   . Cardiomyopathy    LVEF 50-55% 8/11 - [facility]  . Colonic polyp   . Diverticulosis of colon   . ESRD on hemodialysis ([place])    Dr. Lowanda Foster [facility]  . Essential hypertension, benign   . Gout   . Mixed hyperlipidemia   . Neuropathy    in both feet  . OSA (obstructive sleep apnea)    CPAP  . Renal cell cancer ([place])   . Type 2 diabetes mellitus (HCC)    Type II    Family History  Problem Relation Age of Onset  . Hypertension Mother   . Hypertension Other        Siblings  . Diabetes type II Other        Siblings  . Colon cancer Neg Hx    Social History:   reports that he quit smoking about 19 years ago. His smoking use included cigarettes. He has never used smokeless tobacco. He reports that he does not drink alcohol or use drugs.  Medications  Current Facility-Administered Medications:  .  fentaNYL (SUBLIMAZE) 100 MCG/2ML injection, , , ,  .  midazolam (VERSED) 2 MG/2ML injection, , , ,  .  0.9 %  sodium chloride infusion (Manually program via Guardrails IV Fluids), , Intravenous, Once, Elgergawy, Dawood S, MD .  0.9 %  sodium chloride infusion, 250 mL, Intravenous, PRN, Denton Brick, Courage, MD .  acetaminophen (TYLENOL) tablet  650 mg, 650 mg, Oral, Q6H PRN **OR** acetaminophen (TYLENOL) suppository 650 mg, 650 mg, Rectal, Q6H PRN, Emokpae, Courage, MD .  albuterol (PROVENTIL) (2.5 MG/3ML) 0.083% nebulizer solution 2.5 mg, 2.5 mg, Nebulization, Q2H PRN, Emokpae, Courage, MD .  amiodarone (PACERONE) tablet 200 mg, 200 mg, Oral, Daily, Emokpae, Courage, MD, 200 mg at 04/19/18 1006 .  aspirin EC tablet 81 mg, 81 mg, Oral, Daily, Emokpae, Courage, MD, 81 mg at 04/19/18 1006 .  ceFEPIme (MAXIPIME) 2 g in sodium chloride 0.9 % 100 mL IVPB, 2 g,  Intravenous, Once, Lyndee Leo, RPH .  Chlorhexidine Gluconate Cloth 2 % PADS 6 each, 6 each, Topical, Q0600, Donato Heinz, MD, 6 each at 04/19/18 1004 .  [START ON 04/22/2018] Darbepoetin Alfa (ARANESP) injection 100 mcg, 100 mcg, Intravenous, Q Sat-HD, Donato Heinz, MD .  diltiazem (CARDIZEM) 100 mg in dextrose 5% 12mL (1 mg/mL) infusion, 5-15 mg/hr, Intravenous, Titrated, Elgergawy, Silver Huguenin, MD, Stopped at 04/19/18 1402 .  [START ON 04/21/2018] doxercalciferol (HECTOROL) injection 3.5 mcg, 3.5 mcg, Intravenous, Q M,W,F-HD, Coladonato, Joseph, MD .  insulin aspart (novoLOG) injection 0-6 Units, 0-6 Units, Subcutaneous, TID WC, Emokpae, Courage, MD .  metoprolol tartrate (LOPRESSOR) tablet 50 mg, 50 mg, Oral, BID, Elgergawy, Silver Huguenin, MD .  midodrine (PROAMATINE) tablet 10 mg, 10 mg, Oral, TID WC, Emokpae, Courage, MD, 10 mg at 04/19/18 1001 .  ondansetron (ZOFRAN) tablet 4 mg, 4 mg, Oral, Q6H PRN **OR** ondansetron (ZOFRAN) injection 4 mg, 4 mg, Intravenous, Q6H PRN, Emokpae, Courage, MD .  sodium chloride flush (NS) 0.9 % injection 3 mL, 3 mL, Intravenous, Q12H, Emokpae, Courage, MD, 3 mL at 04/19/18 1004 .  sodium chloride flush (NS) 0.9 % injection 3 mL, 3 mL, Intravenous, PRN, Denton Brick, Courage, MD .  Derrill Memo ON 04/21/2018] vancomycin (VANCOCIN) IVPB 1000 mg/200 mL premix, 1,000 mg, Intravenous, Q M,W,F-HD, Lyndee Leo, [facility]  Exam: Current vital signs: BP (!) 74/43   Pulse 74   Temp 97.8 F (36.6 C) (Axillary)   Resp (!) 23   Ht 6\' 2"  (1.88 m)   Wt 91.2 kg   SpO2 100%   BMI 25.81 kg/m  Vital signs in last 24 hours: Temp:  [97.8 F (36.6 C)-99.7 F (37.6 C)] 97.8 F (36.6 C) (02/26 1143) Pulse Rate:  [74-122] 74 (02/26 1356) Resp:  [12-30] 23 (02/26 1356) BP: (72-163)/(43-110) 74/43 (02/26 1357) SpO2:  [98 %-100 %] 100 % (02/26 1356) Weight:  [90.1 kg-91.2 kg] 91.2 kg (02/26 0641) General: Awake but very sickly looking.  In some respiratory distress. HEENT:  Normocephalic atraumatic dry mucous membranes Lungs: Clear to auscultation with wheezing CVS: S1 is heard, irregularly irregular and tachycardic Extremities: Bilateral BKA, incision site [facility] Neurological exam Patient is awake, in some respiratory distress Is unable to follow commands consistently. Very poor attention concentration Cranial nerves: Pupils are equal round reactive light, he has asterixis/myoclonic movements involving his eyelids facial muscles and at times arms and legs as well.  Does not bring to threat from either side, has no forced gaze deviation.  Possible left gaze preference but very difficult to ascertain.  Face is symmetric. Motor exam: All 4 extremities dropped down to the bed before 10 seconds with upper extremities and 5 seconds to lower extremities.  He is not able to follow commands to do a thorough strength examination but on outstretched arms he does have prominent asterixis. Sensory exam: Grimaces to noxious stimulation in all 4 Coordination cannot be assessed due to his  mentation   NIHSS 1a Level of Conscious.: 0 1b LOC Questions: 2 1c LOC Commands: 2 2 Best Gaze: 0 3 Visual: 0 4 Facial Palsy: 0 5a Motor Arm - left: 1 5b Motor Arm - Right: 1 6a Motor Leg - Left: 2 6b Motor Leg - Right: 2 7 Limb Ataxia: 0 8 Sensory: 0 9 Best Language: 2 10 Dysarthria: 2 11 Extinct. and Inatten.: 0 TOTAL: 14  Labs I have reviewed labs in epic and the results pertinent to this consultation are: Market leukocytosis with WBC count of 29.7 thousand, anemia hemoglobin 9.1, sodium 134, potassium 5.2, glucose 287, BUN 66, creatinine 11.1, calcium 8.5, AST 99, bilirubin 1.1. CBC    Component Value Date/Time   WBC 29.7 (H) 04/19/2018 1408   RBC 3.29 (L) 04/19/2018 1408   HGB 9.1 (L) 04/19/2018 1408   HCT 32.8 (L) 04/19/2018 1408   PLT 205 04/19/2018 1408   MCV 99.7 04/19/2018 1408   MCH 27.7 04/19/2018 1408   MCHC 27.7 (L) 04/19/2018 1408   RDW 18.3 (H) 04/19/2018  1408   LYMPHSABS 1.3 03/26/2018 0751   MONOABS 1.4 (H) 04/09/2018 0751   EOSABS 0.1 04/09/2018 0751   BASOSABS 0.0 03/26/2018 0751    CMP     Component Value Date/Time   NA 134 (L) 04/19/2018 1408   K 5.2 (H) 04/19/2018 1408   CL 98 04/19/2018 1408   CO2 15 (L) 04/19/2018 1408   GLUCOSE 287 (H) 04/19/2018 1408   BUN 66 (H) 04/19/2018 1408   CREATININE 11.11 (H) 04/19/2018 1408   CALCIUM 8.5 (L) 04/19/2018 1408   CALCIUM 9.0 03/21/2007 0415   PROT 5.6 (L) 04/12/2018 0751   ALBUMIN 1.5 (L) 04/19/2018 1408   AST 99 (H) 03/26/2018 0751   ALT 9 03/26/2018 0751   ALKPHOS 80 04/04/2018 0751   BILITOT 1.1 04/01/2018 0751   GFRNONAA 4 (L) 04/19/2018 1408   GFRAA 5 (L) 04/19/2018 1408   Imaging I have reviewed the images obtained: CT-scan of the brain-no acute changes noted on the noncontrast CT of the head   EEG done today shows generalized slowing consistent with diffuse encephalopathy  Assessment: 63 year old man past history of atrial fibrillation on Coumadin, recently admitted for septic shock from right-sided pneumonia/H CAP, recent BKA history of essential hypertension and orthostatic hypotension is being asked to be seen by neurology for worsening altered mental status and possible right-sided weakness.  His exam is consistent with generalized encephalopathy  On further history taking, the patient's family reports that he has been altered at least for the past 2 days.    There was some question of right-sided weakness but I think the patient has florid asterixis which is probably likely from his underlying uremia, combined with encephalopathy and inability to follow commands consistently might have contributed to presentation seeming to be of one-sided weakness although stroke is not unlikely given all his risk factors.  He is adequately anticoagulated and there is no evidence of evolving stroke or bleed on the noncontrast CT of the head but further MRI imaging would be helpful  for that reason.  More likely differential on the top of my list is toxic metabolic encephalopathy.  Impression: Toxic metabolic encephalopathy Evaluate for stroke  Recommendations: Medical management per PCCM to correct his toxic metabolic derangements. Check ammonia levels Check B12 MRI of the brain when stable Neurology will follow with you.  -- Amie Portland, MD [facility] Pager: [phone] If 7pm to 7am, please call on call  as listed on AMION.   CRITICAL CARE ATTESTATION Performed by: Amie Portland, MD Total critical care time: 30 minutes Critical care time was exclusive of separately billable procedures and treating other patients and/or supervising APPs/Residents/Students Critical care was necessary to treat or prevent imminent or life-threatening deterioration due to toxic metabolic encephalopathy This patient is critically ill and at significant risk for neurological worsening and/or death and care requires constant monitoring. Critical care was time spent personally by me on the following activities: development of treatment plan with patient and/or surrogate as well as nursing, discussions with consultants, evaluation of patient's response to treatment, examination of patient, obtaining history from patient or surrogate, ordering and performing treatments and interventions, ordering and review of laboratory studies, ordering and review of radiographic studies, pulse oximetry, re-evaluation of patient's condition, participation in multidisciplinary rounds and medical decision making of high complexity in the care of this patient.

## 2018-04-19 NOTE — Progress Notes (Addendum)
Alerted floor RN Mueller, Melynn C. Per MD Coladonato patient will return to his room. Can have hemodialysis on tomorrow. Heart rate not stabilized floor RN to start Cardizem drip.

## 2018-04-19 NOTE — Progress Notes (Addendum)
Addendum 2/26pm, patient with altered mental status, hemodialysis appears to be postponed until tomorrow. May need intubation, INR down to 2.3 this afternoon, will hold off on any warfarin tonight and follow up need for possible transition to heparin tomorrow if INR drops below 2.   Loaded with abx on 2/25, will follow HD schedule and redose as indicated post HD.    [place] for Coumadin Indication: atrial fibrillation  Allergies  Allergen Reactions  . Metformin And Related Other (See Comments)    Diarrhea, bloody stool, upset stomach.     Patient Measurements: Height: 6\' 2"  (188 cm) Weight: 201 lb 1 oz (91.2 kg) IBW/kg (Calculated) : 82.2  Vital Signs: Temp: 98.1 F (36.7 C) (02/26 0740) Temp Source: Axillary (02/26 0740) BP: 135/70 (02/26 1037) Pulse Rate: 122 (02/26 0740)  Labs: Recent Labs    04/11/2018 0751  HGB 7.9*  HCT 29.5*  PLT 210  LABPROT 34.1*  INR 3.4*  CREATININE 9.22*    Estimated Creatinine Clearance: 9.4 mL/min (A) (by C-G formula based on SCr of 9.22 mg/dL (H)).   Medical History: Past Medical History:  Diagnosis Date  . Anemia   . Atrial fibrillation ([place])   . Cardiomyopathy    LVEF 50-55% 8/11 - [facility]  . Colonic polyp   . Diverticulosis of colon   . ESRD on hemodialysis ([place])    Dr. Lowanda Foster [facility]  . Essential hypertension, benign   . Gout   . Mixed hyperlipidemia   . Neuropathy    in both feet  . OSA (obstructive sleep apnea)    CPAP  . Renal cell cancer ([place])   . Type 2 diabetes mellitus (HCC)    Type II    Medications:  See med rec  Assessment: 63 yo male chronically anticoagulated with coumadin for afib. Pharmacy asked to dose.  INR is elevated yesterday 2/25, not done today, I have ordered it to be drawn in HD.   Home dose is 2.5mg  Monday, Wednesday, Friday, Saturday and 1.25mg  on Tuesday, Thursday, and Sunday  Goal of Therapy:  INR 2-3 Monitor platelets by anticoagulation  protocol: Yes   Plan:  Pending labs in HD Will schedule abx for post HD  Erin Hearing PharmD., BCPS Clinical Pharmacist 04/19/2018 11:32 AM

## 2018-04-19 NOTE — Procedures (Signed)
OGT Placement By MD  OGT placed under direct laryngoscopy and verified by auscultation.  Fermina Mishkin G. Cullen Vanallen, M.D. [facility]. Pager: [phone]. After hours pager: [phone]. 

## 2018-04-19 NOTE — Procedures (Signed)
Intubation Procedure Note Albert [name] [id number] 1955-07-11  Procedure: Intubation Indications: Airway protection and maintenance  Procedure Details Consent: Risks of procedure as well as the alternatives and risks of each were explained to the (patient/caregiver).  Consent for procedure obtained. Time Out: Verified patient identification, verified procedure, site/side was marked, verified correct patient position, special equipment/implants available, medications/allergies/relevent history reviewed, required imaging and test results available.  Performed  Maximum sterile technique was used including gloves, hand hygiene and mask.  MAC    Evaluation Hemodynamic Status: BP stable throughout; O2 sats: stable throughout Patient's Current Condition: stable Complications: No apparent complications Patient did tolerate procedure well. Chest X-ray ordered to verify placement.  CXR: pending.   Albert [name] 04/19/2018

## 2018-04-19 NOTE — Progress Notes (Signed)
PROGRESS NOTE                                                                                                                                                                                                             Patient Demographics:    Albert [name], is a 63 y.o. male, DOB - Aug 05, 1955, LNL:[id number]  Admit date - 04/09/2018   Admitting Physician Roxan Hockey, MD  Outpatient Primary MD for the patient is Redmond School, MD  LOS - 1   Chief Complaint  Patient presents with  . Hypotension  . Altered Mental Status       Brief Narrative   63 y.o. male with past medical history relevant foot ESRD on TTS HD,  A. fib chronically anticoagulated on Coumadin, DM2 complicated by peripheral polyneuropathy, peripheral artery disease with nonhealing ulcer requiring recent bilateral BKA (left BKA on 04/08/2018 and right BKA on 04/11/2018) by  vascular surgery, patient presents to [facility]  ED from hemodialysis center given hypotension and altered mental status during dialysis, he was transferred to [facility] for severe sepsis secondary to H CAP.    Subjective:    [name] today denies any complaints, no chest pain, no shortness of breath.   Assessment  & Plan :    Principal Problem:   Septic shock ([place]) Active Problems:   Essential hypertension, benign   Atrial fibrillation (HCC)   Long term (current) use of anticoagulants   ESRD (end stage renal disease) (HCC)   Acute respiratory failure with hypercapnia (HCC)   Anemia   PVD (peripheral vascular disease) (HCC)   ESRD on dialysis ([place])   Sepsis ([place])   Healthcare-associated pneumonia-----Rt Sided    Severe sepsis  -Sepsis present on admission, given hypotension, fever, elevated lactic acid and altered mental status . -Work-up significant for right lung pneumonia, follow on blood cultures, volume resuscitation is limited given he is end-stage renal disease, overall blood  pressure has been acceptable after initial fluid resuscitation and initiation of antibiotics, continue with broad-spectrum antibiotic vancomycin and cefepime for severe sepsis,, influenza panel is negative, follow-up blood cultures, will try to obtain sputum cultures. -Lactic acid trending down, but he remains significantly altered -We will check respiratory panel,   -Continue with Midodrin  Acute metabolic encephalopathy  -The setting of sepsis and infectious process ammonia within normal limit,  patient appears to be more awake and altered today, but way far from his baseline  -CT head with no acute findings  -Follow on EEG .  ESRD -Renal consulted, to be dialyzed today as he did not finish his dialysis yesterday .  DM2 -Patient remains n.p.o., continue with insulin sliding scale, CBG started to increase, will start on low-dose Lantus, continue to hold metformin,hold linagliptin, hold glipizide  chronic atrial fibrillation with RVR -Toprol and Cardizem has been held on admission given soft blood pressure, amiodarone has been continued, heart rate uncontrolled today at 130s, she required 2 IV Cardizem pushes 10 mg followed by 50 mg, heart rate remains uncontrolled, she will be started on Cardizem drip, hopefully this can be tapered soon given she is back on her home medications . -Warfarin, dosed by pharmacy   history of chronic hypotension with underlying essential hypertension -Continue with Midodrin, blood pressure has improved today, she is back on her Cardizem and metoprolol   Anemia of ESRD -  EPO/Procrit per nephrology team ,  globin 7.9, will transfuse 1 unit PRBC which should help with blood pressure and heart rate .  History of vascular disease -S/p bilateral BKA recently by vascular surgery    Code Status : Full  Family Communication  : none at bedside  Disposition Plan  :pending further work up  Consults  :  renal  Procedures  : 1 unit PRBC oredered for  today  DVT Prophylaxis  :  On waerfarin  Lab Results  Component Value Date   PLT 210 04/21/2018    Antibiotics  :    Anti-infectives (From admission, onward)   Start     Dose/Rate Route Frequency Ordered Stop   04/17/2018 0930  vancomycin (VANCOCIN) IVPB 1000 mg/200 mL premix     1,000 mg 200 mL/hr over 60 Minutes Intravenous  Once 04/17/2018 0810 04/17/2018 1051   04/01/2018 0830  vancomycin (VANCOCIN) IVPB 1000 mg/200 mL premix     1,000 mg 200 mL/hr over 60 Minutes Intravenous  Once 03/26/2018 0810 04/12/2018 0926   04/02/2018 0815  vancomycin (VANCOCIN) 1,750 mg in sodium chloride 0.9 % 500 mL IVPB  Status:  Discontinued     1,750 mg 250 mL/hr over 120 Minutes Intravenous  Once 04/16/2018 0737 03/30/2018 0810   04/01/2018 0745  ceFEPIme (MAXIPIME) 2 g in sodium chloride 0.9 % 100 mL IVPB     2 g 200 mL/hr over 30 Minutes Intravenous  Once 03/28/2018 0737 04/11/2018 0829   04/08/2018 0730  cefTRIAXone (ROCEPHIN) 2 g in sodium chloride 0.9 % 100 mL IVPB  Status:  Discontinued     2 g 200 mL/hr over 30 Minutes Intravenous Every 24 hours 04/21/2018 0725 03/30/2018 0736   04/11/2018 0730  azithromycin (ZITHROMAX) 500 mg in sodium chloride 0.9 % 250 mL IVPB  Status:  Discontinued     500 mg 250 mL/hr over 60 Minutes Intravenous Every 24 hours 03/28/2018 0725 04/02/2018 0736        Objective:   Vitals:   04/19/18 0740 04/19/18 0953 04/19/18 1037 04/19/18 1143  BP: (!) 102/46 (!) 163/95 135/70 (!) 122/110  Pulse: (!) 122   100  Resp: 18 19 (!) 22 (!) 30  Temp: 98.1 F (36.7 C)   97.8 F (36.6 C)  TempSrc: Axillary   Axillary  SpO2: 100%     Weight:      Height:        Wt Readings from Last 3 Encounters:  04/19/18 91.2  kg  04/14/18 87.7 kg  01/31/18 99.8 kg     Intake/Output Summary (Last 24 hours) at 04/19/2018 1229 Last data filed at 04/19/2018 0200 Gross per 24 hour  Intake 500 ml  Output -  Net 500 ml     Physical Exam  Awake Alert, Oriented X 2, still confused, frail Symmetrical Chest  wall movement, Minister entry at the bases, no wheezing, some use of accessory muscles Irregular irregular, tachycardic no Gallops,Rubs or new Murmurs, No Parasternal Heave +ve B.Sounds, Abd Soft, No tenderness,  No rebound - guarding or rigidity. No Cyanosis, Clubbing or edema, bi lateral below-knee amputations, with wound on bilateral lower extremity with no dehiscence, or discharge, staples present.    Data Review:    CBC Recent Labs  Lab 04/13/18 0742 04/14/18 0310 04/16/2018 0751  WBC 19.7* 17.8* 19.3*  HGB 9.0* 9.1* 7.9*  HCT 29.7* 30.2* 29.5*  PLT 244 217 210  MCV 90.3 91.2 102.8*  MCH 27.4 27.5 27.5  MCHC 30.3 30.1 26.8*  RDW 16.2* 16.3* 18.3*  LYMPHSABS  --   --  1.3  MONOABS  --   --  1.4*  EOSABS  --   --  0.1  BASOSABS  --   --  0.0    Chemistries  Recent Labs  Lab 04/13/18 0741 04/14/18 1325 04/07/2018 0751  NA 136  --  138  K 4.9  --  4.2  CL 95*  --  102  CO2 19*  --  18*  GLUCOSE 169*  --  263*  BUN 56*  --  57*  CREATININE 8.54*  --  9.22*  CALCIUM 8.6*  --  8.0*  AST  --  144* 99*  ALT  --  15 9  ALKPHOS  --  73 80  BILITOT  --  1.1 1.1   ------------------------------------------------------------------------------------------------------------------ No results for input(s): CHOL, HDL, LDLCALC, TRIG, CHOLHDL, LDLDIRECT in the last 72 hours.  No results found for: HGBA1C ------------------------------------------------------------------------------------------------------------------ No results for input(s): TSH, T4TOTAL, T3FREE, THYROIDAB in the last 72 hours.  Invalid input(s): FREET3 ------------------------------------------------------------------------------------------------------------------ No results for input(s): VITAMINB12, FOLATE, FERRITIN, TIBC, IRON, RETICCTPCT in the last 72 hours.  Coagulation profile Recent Labs  Lab 04/13/18 1209 04/14/18 0310 03/27/2018 0751  INR 2.56 2.42 3.4*    No results for input(s): DDIMER in  the last 72 hours.  Cardiac Enzymes No results for input(s): CKMB, TROPONINI, MYOGLOBIN in the last 168 hours.  Invalid input(s): CK ------------------------------------------------------------------------------------------------------------------ No results found for: BNP  Inpatient Medications  Scheduled Meds: . sodium chloride   Intravenous Once  . amiodarone  200 mg Oral Daily  . aspirin EC  81 mg Oral Daily  . Chlorhexidine Gluconate Cloth  6 each Topical Q0600  . insulin aspart  0-6 Units Subcutaneous TID WC  . midodrine  10 mg Oral TID WC  . sodium chloride flush  3 mL Intravenous Q12H   Continuous Infusions: . sodium chloride    . diltiazem (CARDIZEM) infusion     PRN Meds:.sodium chloride, acetaminophen **OR** acetaminophen, albuterol, ondansetron **OR** ondansetron (ZOFRAN) IV, sodium chloride flush  Micro Results Recent Results (from the past 240 hour(s))  Blood Culture (routine x 2)     Status: None (Preliminary result)   Collection Time: 04/07/2018  7:22 AM  Result Value Ref Range Status   Specimen Description BLOOD RIGHT HAND  Final   Special Requests   Final    BOTTLES DRAWN AEROBIC AND ANAEROBIC Blood Culture adequate volume   Culture  Final    NO GROWTH < 24 HOURS Performed at [facility], [address] [place], [place] [address]    Report Status PENDING  Incomplete  Blood Culture (routine x 2)     Status: None (Preliminary result)   Collection Time: 03/25/2018  7:51 AM  Result Value Ref Range Status   Specimen Description BLOOD RIGHT ANTECUBITAL  Final   Special Requests   Final    BOTTLES DRAWN AEROBIC AND ANAEROBIC Blood Culture adequate volume   Culture   Final    NO GROWTH < 24 HOURS Performed at [facility], [address], [place], [place] [address]    Report Status PENDING  Incomplete    Radiology Reports Dg Chest 2 View  Result Date: 04/19/2018 CLINICAL DATA:  Dyspnea. EXAM: CHEST - 2 VIEW COMPARISON:  Radiograph of April 18, 2018.  FINDINGS: Stable cardiomegaly. Atherosclerosis of thoracic aorta is noted. No pneumothorax is noted. Hypoinflation of the lungs is noted with mild bibasilar subsegmental atelectasis. No significant pleural effusion is noted. Bony thorax is unremarkable. IMPRESSION: Hypoinflation of the lungs with mild bibasilar subsegmental atelectasis. Aortic Atherosclerosis (ICD10-I70.0). Electronically Signed   By: Marijo Conception, M.D.   On: 04/19/2018 10:38   Dg Ankle 2 Views Left  Result Date: 03/20/2018 CLINICAL DATA:  Pain. EXAM: LEFT ANKLE - 2 VIEW COMPARISON:  None. FINDINGS: There is no evidence of fracture, dislocation, or joint effusion. There is no evidence of arthropathy or other focal bone abnormality. Soft tissues are unremarkable. IMPRESSION: Negative. Electronically Signed   By: Misty Stanley M.D.   On: 03/20/2018 20:14   Ct Head Wo Contrast  Result Date: 04/01/2018 CLINICAL DATA:  Altered mental status. Hypotension. EXAM: CT HEAD WITHOUT CONTRAST TECHNIQUE: Contiguous axial images were obtained from the base of the skull through the vertex without intravenous contrast. COMPARISON:  12/24/2003. FINDINGS: Brain: Interval mild enlargement of the subarachnoid spaces. Normal size and position of the ventricles. Minimal patchy white matter low density in both cerebral hemispheres. Small pituitary gland with no enlargement of the sella. No intracranial hemorrhage, mass lesion or CT evidence of acute infarction. Vascular: No hyperdense vessel or unexpected calcification. Skull: Normal. Negative for fracture or focal lesion. Sinuses/Orbits: Unremarkable. Other: None. IMPRESSION: 1. No acute abnormality. 2. Interval mild diffuse cerebral and cerebellar atrophy. 3. Minimal chronic small vessel white matter ischemic changes in both cerebral hemispheres. Electronically Signed   By: Claudie Revering M.D.   On: 04/21/2018 10:18   US Arterial Abi (screening Lower Extremity)  Result Date: 03/21/2018 CLINICAL DATA:   Gangrene in toes bilaterally EXAM: NONINVASIVE PHYSIOLOGIC VASCULAR STUDY OF BILATERAL LOWER EXTREMITIES TECHNIQUE: Evaluation of both lower extremities were performed at rest, including calculation of ankle-brachial indices with single level Doppler, pressure and pulse volume recording. COMPARISON:  None. FINDINGS: Right ABI:  Noncompressible vessels at the ankle. Left ABI:  Noncompressible vessels at the ankle. Right Lower Extremity: Monophasic posterior tibial and dorsalis pedis waveforms. Left Lower Extremity: The posterior tibial Doppler waveform is absent. The dorsalis pedis waveform is monophasic. IMPRESSION: There is severe bilateral lower extremity arterial occlusive disease. Electronically Signed   By: Marybelle Killings M.D.   On: 03/21/2018 16:27   Dg Chest Port 1 View  Result Date: 03/31/2018 CLINICAL DATA:  Fever with hypoxia EXAM: PORTABLE CHEST 1 VIEW COMPARISON:  January 04, 2017 FINDINGS: There is focal airspace consolidation in the medial left base. There is atelectasis with apparent superimposed infiltrate in the right base. The lungs elsewhere are clear.  Heart is borderline enlarged with pulmonary vascularity normal. No adenopathy. There is aortic atherosclerosis. Note bony bridge between the anterior right first and second ribs, a likely anatomic variant. There is degenerative change in the left shoulder. IMPRESSION: Patchy airspace consolidation in the lung bases with associated atelectasis in the right base. Heart is borderline enlarged. Aortic Atherosclerosis (ICD10-I70.0). Electronically Signed   By: Lowella Grip III M.D.   On: 04/13/2018 07:49   Dg Foot Complete Left  Result Date: 03/20/2018 CLINICAL DATA:  Soft tissue wound at second toe and posterior heel. EXAM: LEFT FOOT - COMPLETE 3+ VIEW COMPARISON:  None. FINDINGS: No evidence for an acute fracture. No subluxation or dislocation. No bony destruction evident in the second toe or calcaneus to suggest overt osteomyelitis. No gas  evident within the soft tissues. IMPRESSION: Negative. Electronically Signed   By: Misty Stanley M.D.   On: 03/20/2018 20:13   Dg Foot Complete Right  Result Date: 03/20/2018 CLINICAL DATA:  Left foot pain EXAM: RIGHT FOOT COMPLETE - 3+ VIEW COMPARISON:  None. FINDINGS: Severe diffuse vascular calcifications. Plantar calcaneal spur. No acute bony abnormality. Specifically, no fracture, subluxation, or dislocation. IMPRESSION: No acute bony abnormality. Electronically Signed   By: Rolm Baptise M.D.   On: 03/20/2018 20:13   Vas [place] Lower Extremity Saphenous Vein Mapping  Result Date: 03/24/2018 LOWER EXTREMITY VEIN MAPPING History: History of PAD; patient is pre-operative for lower extremity bypass          graft.  Performing Technologist: June Leap RDMS, RVT  Examination Guidelines: A complete evaluation includes B-mode imaging, spectral Doppler, color Doppler, and power Doppler as needed of all accessible portions of each vessel. Bilateral testing is considered an integral part of a complete examination. Limited examinations for reoccurring indications may be performed as noted. +---------------+-----------+----------------------+---------------+-----------+   RT Diameter  RT Findings         GSV            LT Diameter  LT Findings      (cm)                                            (cm)                  +---------------+-----------+----------------------+---------------+-----------+      0.62                     Saphenofemoral         0.42                                                   Junction                                  +---------------+-----------+----------------------+---------------+-----------+      0.35                     Proximal thigh         0.38                  +---------------+-----------+----------------------+---------------+-----------+      0.30  Mid thigh            0.35                   +---------------+-----------+----------------------+---------------+-----------+      0.28                      Distal thigh          0.40                  +---------------+-----------+----------------------+---------------+-----------+      0.41       branching          Knee              0.27                  +---------------+-----------+----------------------+---------------+-----------+      0.34                       Prox calf            0.28                  +---------------+-----------+----------------------+---------------+-----------+      0.30       branching        Mid calf            0.27       branching  +---------------+-----------+----------------------+---------------+-----------+      0.23                      Distal calf           0.36                  +---------------+-----------+----------------------+---------------+-----------+ Diagnosing physician: Curt Jews MD Electronically signed by Curt Jews MD on 03/24/2018 at 4:53:39 PM.    Final      Phillips Climes M.D on 04/19/2018 at 12:29 PM  Between 7am to 7pm - Pager - [phone]  After 7pm go to [email or web address] - password [facility]  [facility] -  Office  [phone]

## 2018-04-19 NOTE — Progress Notes (Signed)
At 0700 pt was able to track RN finger, bilateral grips in hands. HR was continuing in 130-140s, initially 10mg  IV cardizem given and then later 15mg . HR was still in 120s. Orders for IV cardizem to start at a rate of 5mg /hr which was started at 1336. At 1402 drip was stopped d/t a BP of  74/43. MD made aware and came to assess the pt. A 287mL NS bolus was given and critical care MD at bedside. Code stroke was activated, Rapid response RN at bedside  and pt sent to CT. Pt to go to [id number].

## 2018-04-19 NOTE — Procedures (Signed)
ELECTROENCEPHALOGRAM REPORT   Patient: Albert [name]       Room #: 4H88I EEG No. ID: [id number] Age: 63 y.o.        Sex: male Referring Physician: Elgergawy Report Date:  04/19/2018        Interpreting Physician: Alexis Goodell  History: Albert [name] is an 63 y.o. male with altered mental status  Medications:  Cardizem, Amiodarone, ASA, Insulin, Midodrine  Conditions of Recording:  This is a 21 channel routine scalp EEG performed with bipolar and monopolar montages arranged in accordance to the international 10/20 system of electrode placement. One channel was dedicated to EKG recording.  The patient is in the altered state.  Description:  The background activity is slow and poorly organized.  It consists of a low voltage polymorphic delta activity that is diffusely distributed and continuous.  No evidence of reactivity noted.   No epileptiform activity is noted.   Hyperventilation and intermittent photic stimulation were not performed.   IMPRESSION: This is an abnormal EEG secondary to general background slowing.  This finding may be seen with a diffuse disturbance that is etiologically nonspecific, but may include a metabolic encephalopathy, among other possibilities.  No epileptiform activity was noted.     Alexis Goodell, MD Neurology [phone] 04/19/2018, 12:45 PM

## 2018-04-19 NOTE — Progress Notes (Signed)
EEG completed; results pending.    

## 2018-04-19 NOTE — Progress Notes (Signed)
SLP Cancellation Note  Patient Details Name: Albert [name] MRN: [id number] DOB: 02/18/1956   Cancelled treatment:       Reason Eval/Treat Not Completed: Medical issues which prohibited therapy. Pt with change of status this afternoon, MD currently with pt and awaiting input from critical care. SLP will hold swallow eval for now but will f/u for readiness.   [name] 04/19/2018, 2:16 PM  Pollyann Glen, M.A. [place] Acute Environmental education officer [phone] Office [phone]

## 2018-04-19 NOTE — Procedures (Signed)
Central Venous Catheter Insertion Procedure Note Albert [name] [id number] May 07, 1955  Procedure: Insertion of Central Venous Catheter Indications: HD catheter  Procedure Details Consent: Unable to obtain consent because of emergent medical necessity. Time Out: Verified patient identification, verified procedure, site/side was marked, verified correct patient position, special equipment/implants available, medications/allergies/relevent history reviewed, required imaging and test results available.  Performed  Maximum sterile technique was used including gloves, hand hygiene and mask. Skin prep: Chlorhexidine; local anesthetic administered A antimicrobial bonded/coated triple lumen catheter was placed in the right internal jugular vein using the Seldinger technique.  Evaluation Blood flow good Complications: No apparent complications Patient did tolerate procedure well. Chest X-ray ordered to verify placement.  CXR: pending.  U/S used in placement.  Albert [name] 04/19/2018, 6:41 PM

## 2018-04-19 NOTE — Consult Note (Addendum)
NAME:  Albert [name], MRN:  [id number], DOB:  1956-01-20, LOS: 1 ADMISSION DATE:  04/15/2018, CONSULTATION DATE:  2/26 REFERRING MD:  Dr. Waldron Labs, CHIEF COMPLAINT:  Lethargy, hypotension   Brief History   63 year old male very recently admitted for BLE amputations. Now admitted with hypotension and presumed septic shock. Mental status worsening, PCCM consulted.   History of present illness   Patient is encephalopathic and/or intubated. Therefore history has been obtained from chart review and family interview.  63 year old male with past medical history as below, which is significant for ESRD (TTS), atrial fibrillation on chronic anticoagulation with Coumadin, diabetes complicated by peripheral neuropathy, and peripheral artery disease requiring him to undergo bilateral below the knee amputation on 04/08/2018, and 04/11/2018.  He was discharged to SNF where family reports his mental status was initially intact.  This slowly deteriorated.  He presented for dialysis on Tuesday, February 25 where he was found to be not in his usual mental status and was hypotensive to 67/51.  In the emergency department his blood pressure somewhat improved with IV fluids and imaging was concerning for right-sided pneumonia.  He was admitted for presumed septic shock secondary to pneumonia. He was treated with cefepime and vancomycin and admitted to progressive care unit. 2/26 he was less responsive and developed AF RVR. He was started on diltiazem, which needed to be discontinued due to hypotension. He remained hypotensive and PCCM was consulted.   Past Medical History   has a past medical history of Anemia, Atrial fibrillation ([place]), Cardiomyopathy, Colonic polyp, Diverticulosis of colon, ESRD on hemodialysis ([place]), Essential hypertension, benign, Gout, Mixed hyperlipidemia, Neuropathy, OSA (obstructive sleep apnea), Renal cell cancer ([place]), and Type 2 diabetes mellitus ([place]).  Significant Hospital Events   2/15  BKA 2/18 BKA 2/21 discharge to SNF 2/25 admit for septic shock/PNA 2/26 ICU transfer  Consults:    Procedures:  ETT 2/26 >  Significant Diagnostic Tests:  CT head 2/26 >  Micro Data:  Blood 2/25 > Tracheal aspirate 2/26 > RVP 2/26 >  Antimicrobials:  Cefepime 2/25> Vancomycin 2/25 >  Interim history/subjective:    Objective   Blood pressure (!) 74/43, pulse 74, temperature 97.8 F (36.6 C), temperature source Axillary, resp. rate (!) 23, height 6\' 2"  (1.88 m), weight 91.2 kg, SpO2 100 %.        Intake/Output Summary (Last 24 hours) at 04/19/2018 1437 Last data filed at 04/19/2018 0900 Gross per 24 hour  Intake 120 ml  Output -  Net 120 ml   Filed Weights   04/13/2018 0716 04/19/2018 1812 04/19/18 0641  Weight: 87 kg 90.1 kg 91.2 kg    Examination: General: chronically ill male, appears older than stated age.  HENT: [place]/AT, No appreciable JVD Lungs: Clear bilateral. Diminished.  Cardiovascular: IRIR, tachy, no MRG Abdomen: Soft, non-tender, non-distended Extremities: Bilateral BKA, recent, staples in place. Incision sites appear clean and without infection.  Neuro:  No verbal response. Does not follow commands. Will nod and shake head only. NO pain response in upper extremities.   Resolved Hospital Problem list     Assessment & Plan:   Acute encephalopathy: suspect this to be metabolic in the setting of septic shock, however, he does have vascular history and seems to be weaker on the R. Need to rule out CVA or intracranial injury.  - Transfer to ICU - CT head - May need intubation for airway protection - Support BP  Septic shock secondary to HCAP: of note he has  a history of chronic hypotension on midodrine.  - Transfer to ICU - Will likely need to start pressors. Phenylephrine to MAP goal 65 mmHg - Gentle IVF - Continue Cefepime and vanco - Follow blood, sputum cultures, RVP. - Continue midodrine.  - Echo  ESRD on HD: Last HD partial tx 2/25 -  nephrology following - May ultimately need CVVHD - No emergent indication for HD   Atrial fibrillation with RVR - start amiodarone infusion - Telemetry monitoring - Holding toprol and cardizem - Holding warfarin, start heparin infusion once warfarin no longer therapeutic.   DM2: complicated by PAD s/p recent BKA. Known to vascular here at [facility]. - CBG monitoring and SSI - no need for VVS consult at this time  Best practice:  Diet: NPO Pain/Anxiety/Delirium protocol (if indicated): Full VAP protocol (if indicated): yes DVT prophylaxis: hold warfarin.  GI prophylaxis: NA Glucose control: CBG monitoring and SSI Mobility: BR Code Status: Limited code: No CPR. Intubation only. Family Communication: Daughter and wife updated.  Disposition: ICU: critically ill.   Labs   CBC: Recent Labs  Lab 04/13/18 0742 04/14/18 0310 03/30/2018 0751 04/19/18 1408  WBC 19.7* 17.8* 19.3* 29.7*  NEUTROABS  --   --  16.3*  --   HGB 9.0* 9.1* 7.9* 9.1*  HCT 29.7* 30.2* 29.5* 32.8*  MCV 90.3 91.2 102.8* 99.7  PLT 244 217 210 283    Basic Metabolic Panel: Recent Labs  Lab 04/13/18 0741 04/17/2018 0751  NA 136 138  K 4.9 4.2  CL 95* 102  CO2 19* 18*  GLUCOSE 169* 263*  BUN 56* 57*  CREATININE 8.54* 9.22*  CALCIUM 8.6* 8.0*  PHOS 6.2*  --    GFR: Estimated Creatinine Clearance: 9.4 mL/min (A) (by C-G formula based on SCr of 9.22 mg/dL (H)). Recent Labs  Lab 04/13/18 0742 04/14/18 0310 03/30/2018 0751 04/04/2018 0959 04/03/2018 1500 04/19/18 1408  WBC 19.7* 17.8* 19.3*  --   --  29.7*  LATICACIDVEN  --   --  5.6* 2.8* 1.9  --     Liver Function Tests: Recent Labs  Lab 04/13/18 0741 04/14/18 1325 03/31/2018 0751  AST  --  144* 99*  ALT  --  15 9  ALKPHOS  --  73 80  BILITOT  --  1.1 1.1  PROT  --  6.4* 5.6*  ALBUMIN 1.7* 1.9* 1.8*   No results for input(s): LIPASE, AMYLASE in the last 168 hours. Recent Labs  Lab 04/13/2018 1629  AMMONIA 18    ABG    Component Value  Date/Time   PHART 7.283 (L) 03/28/2018 1528   PCO2ART 46.7 04/08/2018 1528   PO2ART 83.4 04/07/2018 1528   HCO3 20.6 04/06/2018 1528   TCO2 28 01/18/2017 1346   ACIDBASEDEF 4.3 (H) 03/31/2018 1528   O2SAT 94.4 04/03/2018 1528     Coagulation Profile: Recent Labs  Lab 04/13/18 1209 04/14/18 0310 04/11/2018 0751  INR 2.56 2.42 3.4*    Cardiac Enzymes: No results for input(s): CKTOTAL, CKMB, CKMBINDEX, TROPONINI in the last 168 hours.  HbA1C: No results found for: HGBA1C  CBG: Recent Labs  Lab 04/13/2018 1855 03/29/2018 1927 04/03/2018 2111 04/19/18 0824 04/19/18 1138  GLUCAP 237* 251* 245* 247* 247*    Review of Systems:   Unable as patient is encephalopathic   Past Medical History  He,  has a past medical history of Anemia, Atrial fibrillation ([place]), Cardiomyopathy, Colonic polyp, Diverticulosis of colon, ESRD on hemodialysis ([place]), Essential hypertension, benign, Gout, Mixed hyperlipidemia,  Neuropathy, OSA (obstructive sleep apnea), Renal cell cancer ([place]), and Type 2 diabetes mellitus ([place]).   Surgical History    Past Surgical History:  Procedure Laterality Date  . ABDOMINAL AORTOGRAM W/LOWER EXTREMITY N/A 03/27/2018   Procedure: ABDOMINAL AORTOGRAM W/LOWER EXTREMITY;  Surgeon: Waynetta Sandy, MD;  Location: [place] [facility];  Service: Cardiovascular;  Laterality: N/A;  . AMPUTATION Bilateral 03/29/2018   Procedure: RESECTION TRANSMETATARSAL - LEFT SECOND TOE OF LEFT FOOT TOE RESECTION TRANSMETATARSAL FIRST ,SECOND,THIRD TOE RIGHT FOOT REMOVAL SECOND TOE GREAT TOE ,THIRD TOE;  Surgeon: Elam Dutch, MD;  Location: [place] OR;  Service: Vascular;  Laterality: Bilateral;  . AMPUTATION Left 04/08/2018   Procedure: LEFT BELOW KNEE AMPUTATION;  Surgeon: Elam Dutch, MD;  Location: [place];  Service: Vascular;  Laterality: Left;  . AMPUTATION Right 04/11/2018   Procedure: RIGHT BELOW KNEE AMPUTATION;  Surgeon: Waynetta Sandy, MD;  Location: [place];   Service: Vascular;  Laterality: Right;  . BIOPSY  01/05/2017   Procedure: BIOPSY;  Surgeon: Daneil Dolin, MD;  [facility];  Service: Endoscopy;;  gastric  . COLONOSCOPY N/A 01/07/2016   Dr. [name]: Grade 1 internal hemorrhoids, otherwise normal  . COLONOSCOPY W/ POLYPECTOMY  2009, 2012   2009: Dr. [name]: 1.25 cm adenomatous polyp without high grade dysplasia, distal to IC valve. 2012: normal rectum, few pancolonic diverticula, single diminutive hyperplastic polyp  . ESOPHAGOGASTRODUODENOSCOPY  2009   Dr. [name]: accentuated undulating Z-lin with couple of islands of salmon-colored epithelium suspicious for Barrett's, s/p biopsy with path revealing mild inflammation, no metaplasia, dysplasia, or malignancy  . ESOPHAGOGASTRODUODENOSCOPY N/A 01/05/2017   Procedure: ESOPHAGOGASTRODUODENOSCOPY (EGD);  Surgeon: Daneil Dolin, MD;  [facility];  Service: Endoscopy;  Laterality: N/A;  . EUS N/A 04/21/2017   Procedure: UPPER ENDOSCOPIC ULTRASOUND (EUS) LINEAR;  Surgeon: Milus Banister, MD;  [facility];  Service: Endoscopy;  Laterality: N/A;  . FISTULA SUPERFICIALIZATION Left 03/31/2015   Procedure: CEPHALIC VEIN TURNDOWN; PLICATION OF BRACHIOCEPHALIC AVF;  Surgeon: Conrad [place], MD;  Location: [place];  Service: Vascular;  Laterality: Left;  . FISTULOGRAM N/A 10/19/2013   Procedure: FISTULOGRAM;  Surgeon: Rosetta Posner, MD;  Location: [facility] CATH LAB;  Service: Cardiovascular;  Laterality: N/A;  . Left arm AV fistula  2009   Dr. [name]  . PERIPHERAL VASCULAR BALLOON ANGIOPLASTY  03/27/2018   Procedure: PERIPHERAL VASCULAR BALLOON ANGIOPLASTY;  Surgeon: Waynetta Sandy, MD;  Location: [place] [facility];  Service: Cardiovascular;;  LT PT  . PERIPHERAL VASCULAR CATHETERIZATION N/A 03/10/2015   Procedure: Fistulagram;  Surgeon: Conrad Henry, MD;  Location: [place] [facility];  Service: Cardiovascular;  Laterality: N/A;  . Right nephrectomy       Social History    reports that he quit smoking about 19 years ago. His smoking use included cigarettes. He has never used smokeless tobacco. He reports that he does not drink alcohol or use drugs.   Family History   His family history includes Diabetes type II in an other family member; Hypertension in his mother and another family member. There is no history of Colon cancer.   Allergies Allergies  Allergen Reactions  . Metformin And Related Other (See Comments)    Diarrhea, bloody stool, upset stomach.      Home Medications  Prior to Admission medications   Medication Sig Start Date End Date Taking? Authorizing Provider  allopurinol (ZYLOPRIM) 100 MG tablet Take 100 mg by mouth  daily.    Yes [provider]  amiodarone (PACERONE) 200 MG tablet Take 1 tablet (200 mg total) by mouth daily. 04/15/18  Yes Caren Griffins, MD  aspirin EC 81 MG EC tablet Take 1 tablet (81 mg total) by mouth daily. 04/15/18  Yes Caren Griffins, MD  cinacalcet (SENSIPAR) 30 MG tablet Take 30 mg by mouth every evening.    Yes [provider]  diltiazem (CARDIZEM CD) 180 MG 24 hr capsule TAKE 1 CAPSULE EVERY DAY Patient taking differently: Take 180 mg by mouth daily.  03/07/18  Yes [name], MD  docusate sodium (COLACE) 100 MG capsule Take 100 mg by mouth daily.   Yes [provider]  fenofibrate 160 MG tablet Take 160 mg by mouth at bedtime.    Yes [provider]  fish oil-omega-3 fatty acids 1000 MG capsule Take 1 g by mouth 3 (three) times daily.    Yes [provider]  gabapentin (NEURONTIN) 300 MG capsule Take 300 mg by mouth 3 (three) times daily.    Yes [provider]  glipiZIDE (GLUCOTROL) 5 MG tablet Take 5 mg by mouth daily before breakfast.   Yes [provider]  metoprolol tartrate (LOPRESSOR) 50 MG tablet Take 1 tablet (50 mg total) by mouth 2 (two) times daily. 04/14/18  Yes Gherghe, Vella Redhead, MD  midodrine (PROAMATINE) 10 MG tablet Take 1  tablet (10 mg total) by mouth 3 (three) times daily with meals. 04/14/18  Yes Gherghe, Vella Redhead, MD  oxyCODONE (OXY IR/ROXICODONE) 5 MG immediate release tablet Take 1-2 tablets (5-10 mg total) by mouth every 4 (four) hours as needed for severe pain. Patient taking differently: Take 5-10 mg by mouth every 4 (four) hours as needed for moderate pain.  04/14/18  Yes Caren Griffins, MD  pantoprazole (PROTONIX) 40 MG tablet Take 1 tablet (40 mg total) by mouth daily. Patient taking differently: Take 40 mg by mouth every evening.  04/05/17  Yes Virl Cagey, MD  pravastatin (PRAVACHOL) 10 MG tablet Take 10 mg by mouth at bedtime.    Yes [provider]  rosuvastatin (CRESTOR) 5 MG tablet Take 5 mg by mouth daily.   Yes [provider]  TRADJENTA 5 MG TABS tablet Take 5 mg by mouth every morning.  04/13/17  Yes [provider]  warfarin (COUMADIN) 2.5 MG tablet TAKE 1 TABLET EVERY DAY EXCEPT TAKE  1/2 TABLET ON SUNDAYS, [place] OR AS DIRECTED Patient taking differently: Take 2.5 mg by mouth See admin instructions. Take 2.5 mg daily on Monday, Wednesday, Friday, and Saturday. 02/23/17  Yes [name], MD  Wheat Dextrin (BENEFIBER DRINK MIX PO) Take 1 Dose by mouth daily. 1 tbsp    Yes [provider]  glipiZIDE (GLUCOTROL XL) 5 MG 24 hr tablet Take 5 mg by mouth every morning.  02/13/17   [provider]  sevelamer carbonate (RENVELA) 800 MG tablet Take 1,600-3,200 mg by mouth See admin instructions. Take 3200 mg after each meal and 1600 mg each snack    [provider]     Critical care time: 35 mins     Georgann Housekeeper, AGACNP-BC [place] Pager [phone] or [phone]  04/19/2018 3:41 PM  Attending Note:  63 year old male with extensive PMH who presents to PCCM with AMS, hypotension and a-fib with RVR.  Patient was altered.  We performed a head CT that showed no acute intracranial  abnormalities  that I reviewed myself.  On exam, not arousable but tracks with clear lungs.  Will transfer to the ICU.  May need a TLC for pressors.  Start amiodarone for a-fib with RVR.  Neo for BP support.  Abx as ordered.  I spoke with the family extensively, will change code status to LCB with no CPR/cardioversion.  PCCM will assume care.  The patient is critically ill with multiple organ systems failure and requires high complexity decision making for assessment and support, frequent evaluation and titration of therapies, application of advanced monitoring technologies and extensive interpretation of multiple databases.   Critical Care Time devoted to patient care services described in this note is  42  Minutes. This time reflects time of care of this signee Dr [name]. This critical care time does not reflect procedure time, or teaching time or supervisory time of PA/NP/Med student/Med Resident etc but could involve care discussion time.  Rush Farmer, M.D. [facility] Pulmonary/Critical Care Medicine. Pager: [phone]. After hours pager: [phone].

## 2018-04-20 ENCOUNTER — Inpatient Hospital Stay (HOSPITAL_COMMUNITY): Payer: Medicare HMO

## 2018-04-20 ENCOUNTER — Telehealth: Payer: Self-pay | Admitting: Vascular Surgery

## 2018-04-20 DIAGNOSIS — R4182 Altered mental status, unspecified: Secondary | ICD-10-CM

## 2018-04-20 DIAGNOSIS — Z7901 Long term (current) use of anticoagulants: Secondary | ICD-10-CM

## 2018-04-20 DIAGNOSIS — I739 Peripheral vascular disease, unspecified: Secondary | ICD-10-CM

## 2018-04-20 LAB — AMMONIA: AMMONIA: 40 umol/L — AB (ref 9–35)

## 2018-04-20 LAB — RENAL FUNCTION PANEL
Albumin: 1.3 g/dL — ABNORMAL LOW (ref 3.5–5.0)
Anion gap: 22 — ABNORMAL HIGH (ref 5–15)
BUN: 66 mg/dL — ABNORMAL HIGH (ref 8–23)
CHLORIDE: 101 mmol/L (ref 98–111)
CO2: 11 mmol/L — ABNORMAL LOW (ref 22–32)
Calcium: 8.2 mg/dL — ABNORMAL LOW (ref 8.9–10.3)
Creatinine, Ser: 10.96 mg/dL — ABNORMAL HIGH (ref 0.61–1.24)
GFR calc Af Amer: 5 mL/min — ABNORMAL LOW (ref >60)
GFR calc non Af Amer: 4 mL/min — ABNORMAL LOW (ref >60)
Glucose, Bld: 252 mg/dL — ABNORMAL HIGH (ref 70–99)
Phosphorus: 8.6 mg/dL — ABNORMAL HIGH (ref 2.5–4.6)
Potassium: 5.8 mmol/L — ABNORMAL HIGH (ref 3.5–5.1)
Sodium: 134 mmol/L — ABNORMAL LOW (ref 135–145)

## 2018-04-20 LAB — GLUCOSE, CAPILLARY
Glucose-Capillary: 203 mg/dL — ABNORMAL HIGH (ref 70–99)
Glucose-Capillary: 130 mg/dL — ABNORMAL HIGH (ref 70–99)
Glucose-Capillary: 125 mg/dL — ABNORMAL HIGH (ref 70–99)

## 2018-04-20 LAB — TYPE AND SCREEN
ABO/RH(D): O POS
Antibody Screen: NEGATIVE
Unit division: 0

## 2018-04-20 LAB — BPAM RBC
Blood Product Expiration Date: 202003212359
ISSUE DATE / TIME: 202002261557
Unit Type and Rh: 5100

## 2018-04-20 LAB — POCT ACTIVATED CLOTTING TIME
Activated Clotting Time: 417 seconds
Activated Clotting Time: 203 seconds
Activated Clotting Time: 197 seconds

## 2018-04-20 LAB — CBC
HCT: 35 % — ABNORMAL LOW (ref 39.0–52.0)
Hemoglobin: 9.8 g/dL — ABNORMAL LOW (ref 13.0–17.0)
MCH: 28.5 pg (ref 26.0–34.0)
MCHC: 28 g/dL — ABNORMAL LOW (ref 30.0–36.0)
MCV: 101.7 fL — ABNORMAL HIGH (ref 80.0–100.0)
Platelets: 254 10*3/uL (ref 150–400)
RBC: 3.44 MIL/uL — ABNORMAL LOW (ref 4.22–5.81)
RDW: 17.4 % — ABNORMAL HIGH (ref 11.5–15.5)
WBC: 35.6 10*3/uL — ABNORMAL HIGH (ref 4.0–10.5)
nRBC: 2.6 % — ABNORMAL HIGH (ref 0.0–0.2)

## 2018-04-20 LAB — PROTIME-INR
INR: 3 — ABNORMAL HIGH (ref 0.8–1.2)
Prothrombin Time: 30.8 seconds — ABNORMAL HIGH (ref 11.4–15.2)

## 2018-04-20 LAB — TROPONIN I: Troponin I: 0.12 ng/mL (ref <0.03)

## 2018-04-20 LAB — VITAMIN B12: Vitamin B-12: 3880 pg/mL — ABNORMAL HIGH (ref 180–914)

## 2018-04-20 MED ORDER — VASOPRESSIN 20 UNIT/ML IV SOLN
0.0300 [IU]/min | INTRAVENOUS | Status: DC
  Administered 2018-04-20: 0.03 [IU]/min via INTRAVENOUS
  Filled 2018-04-20: qty 2

## 2018-04-20 MED ORDER — FAMOTIDINE 40 MG/5ML PO SUSR: 40.0000 mg | Freq: Every day | ORAL | Status: DC

## 2018-04-20 MED ORDER — INSULIN ASPART 100 UNIT/ML ~~LOC~~ SOLN
0.0000 [IU] | SUBCUTANEOUS | Status: DC
  Administered 2018-04-20 (×2): 1 [IU] via SUBCUTANEOUS

## 2018-04-20 MED ORDER — PRISMASOL BGK 4/2.5 32-4-2.5 MEQ/L IV SOLN
INTRAVENOUS | Status: DC
  Administered 2018-04-20: 12:00:00 via INTRAVENOUS_CENTRAL
  Filled 2018-04-20 (×7): qty 5000

## 2018-04-20 MED ORDER — SODIUM BICARBONATE 8.4 % IV SOLN
50.0000 meq | Freq: Once | INTRAVENOUS | Status: AC
  Administered 2018-04-20: 50 meq via INTRAVENOUS

## 2018-04-20 MED ORDER — MORPHINE SULFATE (PF) 4 MG/ML IV SOLN
4.0000 mg | INTRAVENOUS | Status: DC | PRN
  Administered 2018-04-20 (×3): 4 mg via INTRAVENOUS
  Filled 2018-04-20 (×3): qty 1

## 2018-04-20 MED ORDER — SODIUM CHLORIDE 0.9 % IV BOLUS
500.0000 mL | Freq: Once | INTRAVENOUS | Status: AC
  Administered 2018-04-20: 500 mL via INTRAVENOUS

## 2018-04-20 MED ORDER — NOREPINEPHRINE 16 MG/250ML-% IV SOLN
0.0000 ug/min | INTRAVENOUS | Status: DC
  Administered 2018-04-20: 40 ug/min via INTRAVENOUS
  Filled 2018-04-20: qty 250

## 2018-04-20 MED ORDER — PRISMASOL BGK 0/2.5 32-2.5 MEQ/L REPLACEMENT SOLN
Status: DC
  Administered 2018-04-20: 12:00:00 via INTRAVENOUS_CENTRAL
  Filled 2018-04-20 (×5): qty 5000

## 2018-04-20 MED ORDER — STERILE WATER FOR INJECTION IV SOLN
INTRAVENOUS | Status: DC
  Administered 2018-04-20: 12:00:00 via INTRAVENOUS_CENTRAL
  Filled 2018-04-20 (×5): qty 150

## 2018-04-20 MED ORDER — HEPARIN BOLUS VIA INFUSION (CRRT)
1000.0000 [IU] | INTRAVENOUS | Status: DC | PRN
  Filled 2018-04-20: qty 1000

## 2018-04-20 MED ORDER — INSULIN ASPART 100 UNIT/ML ~~LOC~~ SOLN: 0.0000 [IU] | SUBCUTANEOUS | Status: DC

## 2018-04-20 MED ORDER — EPINEPHRINE PF 1 MG/ML IJ SOLN
0.5000 ug/min | INTRAVENOUS | Status: DC
  Administered 2018-04-20: 2 ug/min via INTRAVENOUS
  Filled 2018-04-20: qty 4

## 2018-04-20 MED ORDER — SODIUM ZIRCONIUM CYCLOSILICATE 10 G PO PACK
10.0000 g | PACK | Freq: Two times a day (BID) | ORAL | Status: DC
  Filled 2018-04-20: qty 1

## 2018-04-20 MED ORDER — NOREPINEPHRINE 4 MG/250ML-% IV SOLN
0.0000 ug/min | INTRAVENOUS | Status: DC
  Administered 2018-04-20: 40 ug/min via INTRAVENOUS
  Administered 2018-04-20: 2 ug/min via INTRAVENOUS
  Filled 2018-04-20 (×2): qty 250

## 2018-04-20 MED ORDER — GLYCOPYRROLATE 0.2 MG/ML IJ SOLN: 0.2000 mg | INTRAMUSCULAR | Status: DC | PRN

## 2018-04-20 MED ORDER — PANTOPRAZOLE SODIUM 40 MG PO PACK
40.0000 mg | PACK | Freq: Every day | ORAL | Status: DC
  Filled 2018-04-20: qty 20

## 2018-04-20 MED ORDER — SODIUM BICARBONATE 8.4 % IV SOLN
INTRAVENOUS | Status: AC
  Filled 2018-04-20: qty 50

## 2018-04-20 MED ORDER — SODIUM ZIRCONIUM CYCLOSILICATE 5 G PO PACK
5.0000 g | PACK | Freq: Two times a day (BID) | ORAL | Status: DC
  Administered 2018-04-20: 5 g via ORAL
  Filled 2018-04-20: qty 1

## 2018-04-20 MED ORDER — HEPARIN SODIUM (PORCINE) 1000 UNIT/ML DIALYSIS
1000.0000 [IU] | INTRAMUSCULAR | Status: DC | PRN
  Filled 2018-04-20: qty 6

## 2018-04-20 MED ORDER — POLYVINYL ALCOHOL 1.4 % OP SOLN
1.0000 [drp] | Freq: Four times a day (QID) | OPHTHALMIC | Status: DC | PRN
  Filled 2018-04-20: qty 15

## 2018-04-20 MED ORDER — LACTULOSE 10 GM/15ML PO SOLN
10.0000 g | Freq: Two times a day (BID) | ORAL | Status: DC
  Administered 2018-04-20: 10 g via ORAL
  Filled 2018-04-20 (×2): qty 15

## 2018-04-20 MED ORDER — DIPHENHYDRAMINE HCL 50 MG/ML IJ SOLN: 25.0000 mg | INTRAMUSCULAR | Status: DC | PRN

## 2018-04-20 MED ORDER — SODIUM CHLORIDE 0.9 % IV SOLN
250.0000 [IU]/h | INTRAVENOUS | Status: DC
  Administered 2018-04-20: 250 [IU]/h via INTRAVENOUS_CENTRAL
  Filled 2018-04-20 (×2): qty 2

## 2018-04-20 MED ORDER — SODIUM CHLORIDE 0.9 % IV SOLN
2.0000 g | Freq: Two times a day (BID) | INTRAVENOUS | Status: DC
  Filled 2018-04-20: qty 2

## 2018-04-20 MED ORDER — GLYCOPYRROLATE 1 MG PO TABS: 1.0000 mg | ORAL_TABLET | ORAL | Status: DC | PRN

## 2018-04-20 MED ORDER — LORAZEPAM 2 MG/ML IJ SOLN: 2.0000 mg | INTRAMUSCULAR | Status: DC | PRN

## 2018-04-20 MED ORDER — HEPARIN (PORCINE) 2000 UNITS/L FOR CRRT
INTRAVENOUS_CENTRAL | Status: DC | PRN
  Filled 2018-04-20: qty 1000

## 2018-04-20 MED ORDER — VASOPRESSIN 20 UNIT/ML IV SOLN: 0.2000 m[IU]/kg/min | INTRAVENOUS | Status: DC

## 2018-04-20 MED ORDER — DEXTROSE 5 % IV SOLN: INTRAVENOUS | Status: DC

## 2018-04-23 LAB — CULTURE, BLOOD (ROUTINE X 2)
Culture: NO GROWTH
Culture: NO GROWTH
Special Requests: ADEQUATE
Special Requests: ADEQUATE

## 2018-04-23 NOTE — Progress Notes (Signed)
225 ml IV fentanyl wasted in sink with April Lewis RN.

## 2018-04-23 NOTE — Progress Notes (Signed)
SLP Cancellation Note  Patient Details Name: Albert [name] MRN: [id number] DOB: October 11, 1955   Cancelled treatment:       Reason Eval/Treat Not Completed: Medical issues which prohibited therapy. Pt intubated will sign off at this time   [name] May 08, 2018, 7:33 AM

## 2018-04-23 NOTE — Progress Notes (Signed)
Portal Progress Note Patient Name: Albert [name] DOB: 1955/06/10 MRN: [id number]   Date of Service  04/29/2018  HPI/Events of Note  Patient developed hypotension with systolic blood pressure 80 and map 67.  eICU Interventions  Ordered Levophed to maintain map more than 65.      Intervention Category Major Interventions: Hypotension - evaluation and management Intermediate Interventions: Communication with other healthcare providers and/or family;Medication change / dose adjustment  Mady Gemma Apr 29, 2018, 7:11 AM

## 2018-04-23 NOTE — Progress Notes (Addendum)
Initial Nutrition Assessment  DOCUMENTATION CODES:   Not applicable  INTERVENTION:  When medically appropriate, recommend initiating Vital 1.5 via OGT at 30ml/hr (122ml). Pro-stat 49ml once daily and 54ml BID.  Total intake provides 2300 kcal, 156g protein, and 928ml free water. Tube feeding regimen provides 2400mg  K and 1200mg  Phos. Will continue to monitor electrolytes.   NUTRITION DIAGNOSIS:   Inadequate oral intake related to inability to eat as evidenced by NPO status.  GOAL:   Provide needs based on ASPEN/SCCM guidelines  MONITOR:   Labs, I & O's, TF tolerance  REASON FOR ASSESSMENT:   Ventilator   ASSESSMENT:    63 year old male with PMH of ESRD on HD, GOUT, HTN, diverticulosis, Afib, and T2DM. Patient is a resident at [facility]. Had dialysis 04/09/2018 and was having AMS and low BP. New admission for septic shock. Previous admission was for cellulitis of left lower extremity & bilateral dry gangrenous toes. D/t infection in lower extremities pt had bilateral BKA on 2/15 and 2/18.  2/26 intubated and OGtube placed 2/26 HD cath placed  Per nephrology's note, pt too unstable for iHD. Temp HD cath placed in RIJ, CRRT started today but per Dr.Icard pt is not tolerating it well. EDW 97kg. Pt had worsening encephalopathy overnight. Pt has metabolic bone disease and anemia.   Spoke with RN who reported no known plans to extubate soon.   No family members were present, RN reported that pt does have a daughter. Pt did not react during NFPE. Patient does have some muscle depletions.  Current wt is listed at 91.2kg, admission wt was 87kg. Suspect that this is near pt's dry wt.   Patient is intubated on ventilator support.  MAP (cuff): 60s, increasing pressor requirements MV: 15.1 L/min Temp (24hrs), Avg:99.3 F (37.4 C), Min:97.9 F (36.6 C), Max:100.9 F (38.3 C) Net I/O: +7037ml, no output documented  Medications reviewed and include: Aranesp 124mcg,  hectorol 3.5 mcg, insulin aspart 0-9 units q 4 hr, Lokelma 5g, lactulose IV Fentanyl, levophed 11.3 ml/hr, vasopressin 11.51ml/hr, phenylephrine 150ml/hr, Epi 15.18ml/hr  Labs reviewed: Na 134 (L), K 5.8 (H), BUN 66 (H), corrected calcium 10.36, albumin 1.3(L) creatinine 10.96 (H), Phos 8.6 (H), CBG (606,301,601)    NUTRITION - FOCUSED PHYSICAL EXAM:    Most Recent Value  Orbital Region  No depletion  Upper Arm Region  No depletion  Thoracic and Lumbar Region  No depletion  Buccal Region  Unable to assess  Temple Region  No depletion  Clavicle Bone Region  Mild depletion  Clavicle and Acromion Bone Region  Mild depletion  Scapular Bone Region  No depletion  Dorsal Hand  Unable to assess  Patellar Region  Moderate depletion [BKA]  Anterior Thigh Region  Moderate depletion [BKA]  Posterior Calf Region  -- [BKA]  Edema (RD Assessment)  Mild [Arms, some facial]  Hair  Reviewed  Eyes  Reviewed  Mouth  Unable to assess  Skin  Reviewed  Nails  Unable to assess       Diet Order:   Diet Order            Diet NPO time specified  Diet effective now              EDUCATION NEEDS:   No education needs have been identified at this time  Skin:  Skin Assessment: Skin Integrity Issues: Skin Integrity Issues:: Stage II Stage II: Buttocks  Last BM:  Unknown  Height:   Ht Readings from Last  1 Encounters:  04/06/2018 6\' 2"  (1.88 m)  Suspect this ht is Pre BKA.  Weight:   Wt Readings from Last 1 Encounters:  04/19/18 91.2 kg  Admission wt 87.7kg, suspect this is a dry wt.  Ideal Body Weight:  75.13 kg (13% amputation)  BMI:  Body mass index is 25.81 kg/m.  Estimated Nutritional Needs:   Kcal:  2350 kcal  Protein:  140-157g   Fluid:  1L +UOP    [employer] Dietetic Intern

## 2018-04-23 NOTE — Procedures (Signed)
ELECTROENCEPHALOGRAM REPORT   Patient: Albert [name]       Room #: 5T09P EEG No. ID: [id number] Age: 63 y.o.        Sex: male Referring Physician: Elgergawy Report Date:  04-26-18        Interpreting Physician: Alexis Goodell  History: SCHNEIDER WARCHOL is an 63 y.o. male with altered mental status  Medications:  ASA, Maxipime, Aranesp, Insulin, Lactulose, Proamatine, Protonix, Amiodarone, Epinephrine, Fentanyl, Levophed, Heparin, Vasopressin  Conditions of Recording:  This is a 21 channel routine scalp EEG performed with bipolar and monopolar montages arranged in accordance to the international 10/20 system of electrode placement. One channel was dedicated to EKG recording.  The patient is in the intubated and sedated state.  Description:  The background activity is slow and poorly organized.  It consists of a low voltage polymorphic delta activity that is diffusely distributed and continuous.  No evidence of reactivity noted.   No epileptiform activity is noted.   Hyperventilation and intermittent photic stimulation were not performed.   IMPRESSION: This is an abnormal EEG secondary to general background slowing.  This finding may be seen with a diffuse disturbance that is etiologically nonspecific, but may include a metabolic encephalopathy or medication effect, among other possibilities.  No epileptiform activity was noted.     Alexis Goodell, MD Neurology [phone] 04/26/18, 11:58 AM

## 2018-04-23 NOTE — Progress Notes (Signed)
ANTICOAGULATION CONSULT NOTE - Initial Consult  Pharmacy Consult for heparin Indication: atrial fibrillation  Patient Measurements: Height: 6\' 2"  (188 cm) Weight: 201 lb 1 oz (91.2 kg) IBW/kg (Calculated) : 82.2 Heparin Dosing Weight: 87 kg  Vital Signs: Temp: 98.4 F (36.9 C) (02/27 0731) Temp Source: Oral (02/27 0731) BP: 82/64 (02/27 0845) Pulse Rate: 109 (02/27 0845)  Labs: Recent Labs    04/10/2018 0751 04/19/18 1408 05-02-18 0221 May 02, 2018 0222  HGB 7.9* 9.1* 9.8*  --   HCT 29.5* 32.8* 35.0*  --   PLT 210 205 254  --   LABPROT 34.1* 25.0* 30.8*  --   INR 3.4* 2.3* 3.0*  --   CREATININE 9.22* 11.11*  --  10.96*     Medical History: Past Medical History:  Diagnosis Date  . Anemia   . Atrial fibrillation ([place])   . Cardiomyopathy    LVEF 50-55% 8/11 - [facility]  . Colonic polyp   . Diverticulosis of colon   . ESRD on hemodialysis ([place])    Dr. Lowanda Foster [facility]  . Essential hypertension, benign   . Gout   . Mixed hyperlipidemia   . Neuropathy    in both feet  . OSA (obstructive sleep apnea)    CPAP  . Renal cell cancer ([place])   . Type 2 diabetes mellitus ([facility])    Type II     Assessment: 63 yo male s/p recent bilateral BKA earlier this month. He is altered and decompensated last night requiring intubation and vasopressors. He is starting CRRT. He is on warfarin prior to admission for AFib. Current INR 3. Planning to change to heparin given acute status.  Goal of Therapy:  Heparin level 0.3-0.7 units/ml Monitor platelets by anticoagulation protocol: Yes    Plan:  -Will begin heparin when INR < 2 -Daily INR   Silvio Sausedo, Jake Church 05-02-18,9:30 AM

## 2018-04-23 NOTE — Consult Note (Signed)
NAME:  Albert [name], MRN:  [id number], DOB:  [birth year], LOS: 2 ADMISSION DATE:  04/01/2018, CONSULTATION DATE:  2/26 REFERRING MD:  Dr. Waldron Labs, CHIEF COMPLAINT:  Lethargy, hypotension   Brief History   63yo male w/ PMH of ESRD on HD TTS, h/o renal call carcinoma, afib on coumadin, HTN, OSA, T2DM, PAD presented with progressive worsened mental status since recent b/l BKAs 04/08/18 and 04/11/18 found to be hypotensive and in septic shock on admission. Imaging concerning for possible R sided PNA. Mental status worsened 2/26 with Afib with RVR and transported to ICU, intubated.  Significant Hospital Events   2/15 BKA 2/18 BKA 2/21 discharge to SNF 2/25 admit for shock/?PNA 2/26 ICU transfer, intubated  Consults:  PCCM Nephrology Neurology  Procedures:  ETT 2/26 > CVC 2/26 >  Significant Diagnostic Tests:  CT head 2/26 > NAICA EEG 2/26 > negative for seizure  Micro Data:  Blood 2/25 > negative Tracheal aspirate 2/27 > RVP 2/26 > negative  Antimicrobials:  Cefepime 2/25 > Vancomycin 2/25 x1  Interim history/subjective:  Intubated yesterday. Persistently hypotensive, now on 2 pressors.  Objective   Blood pressure (!) 82/64, pulse (!) 109, temperature 98.4 F (36.9 C), temperature source Oral, resp. rate (!) 24, height 6\' 2"  (1.88 m), weight 91.2 kg, SpO2 100 %.    Vent Mode: PRVC FiO2 (%):  [50 %-100 %] 50 % Set Rate:  [16 bmp] 16 bmp Vt Set:  [660 mL] 660 mL PEEP:  [5 cmH20] 5 cmH20 Plateau Pressure:  [14 cmH20-21 cmH20] 18 cmH20   Intake/Output Summary (Last 24 hours) at May 02, 2018 0926 Last data filed at 02-May-2018 0800 Gross per 24 hour  Intake 4548.45 ml  Output 0 ml  Net 4548.45 ml   Filed Weights   03/25/2018 0716 04/06/2018 1812 04/19/18 0641  Weight: 87 kg 90.1 kg 91.2 kg   Examination: General: lying in bed, on vent, not responsive HENT: ETT in place Lungs: CTAB Cardiovascular: IRIR, no murmur Abdomen: slightly tight, + BS Extremities: b/l  BKA, warm, dry Neuro:  Not responsive, not on pressors  Resolved Hospital Problem list     Assessment & Plan:   Acute encephalopathy: suspect this to be metabolic in the setting of shock, Neurology in agreement though with h/o vascular dz will r/o stroke given R sided weakness. CT head negative. EEG negative for seizure. Ammonia slightly elevated today. - Neurology following, appreciate recs - MRI brain when able - Support BP - hopefully CVVHD for electrolyte derangements today - EEG per neuro - lactulose for hyperammonemia  Shock, unclear source. ?HCAP vs RV failure: On chronic midodrine. Initial CXR with slight RLL infiltrate though repeat improved, afebrile.  ECHO 11/2016 EF 50-55% with mild RV dilation. Bedside ultrasound by Dr. Valeta Harms with RV dilation, will repeat formal ECHO today. Recent surgery earlier this month, will obtain dopplers to evaluate for clot. - continue levophed, vasopressin. Wean for MAP >65. - Gentle IVF - Continue Cefepime  - Follow sputum cultures - Continue midodrine.   - Echo - trend trops - LE dopplers  ESRD on HD: Last HD partial tx 2/25. Hyperkalemia - nephrology following - hopefully CVVHD today. - start lokelma  Atrial fibrillation with RVR HR 100-130s overnight. - continue amiodarone infusion - Telemetry monitoring - Holding toprol and cardizem - Holding warfarin, start heparin once INR <2.    DM2: complicated by PAD s/p recent BKA. Known to vascular here at [facility]. - CBG monitoring and SSI - no need for VVS consult  at this time  Best practice:  Diet: NPO Pain/Anxiety/Delirium protocol (if indicated): Full VAP protocol (if indicated): yes DVT prophylaxis: hold warfarin.  GI prophylaxis: NA Glucose control: CBG monitoring and SSI Mobility: BR Code Status: Limited code: No CPR. Intubation only. Family Communication: no family at bedside 2/27 Disposition: ICU: critically ill.   Labs   CBC: Recent Labs  Lab 04/14/18 0310  03/28/2018 0751 04/19/18 1408 05/19/2018 0221  WBC 17.8* 19.3* 29.7* 35.6*  NEUTROABS  --  16.3*  --   --   HGB 9.1* 7.9* 9.1* 9.8*  HCT 30.2* 29.5* 32.8* 35.0*  MCV 91.2 102.8* 99.7 101.7*  PLT 217 210 205 623    Basic Metabolic Panel: Recent Labs  Lab 04/12/2018 0751 04/19/18 1408 05-19-18 0222  NA 138 134* 134*  K 4.2 5.2* 5.8*  CL 102 98 101  CO2 18* 15* 11*  GLUCOSE 263* 287* 252*  BUN 57* 66* 66*  CREATININE 9.22* 11.11* 10.96*  CALCIUM 8.0* 8.5* 8.2*  PHOS  --  6.9* 8.6*   GFR: Estimated Creatinine Clearance: 7.9 mL/min (A) (by C-G formula based on SCr of 10.96 mg/dL (H)). Recent Labs  Lab 04/14/18 0310 04/16/2018 0751 04/17/2018 0959 03/28/2018 1500 04/19/18 1408 05-19-18 0221  WBC 17.8* 19.3*  --   --  29.7* 35.6*  LATICACIDVEN  --  5.6* 2.8* 1.9  --   --     Liver Function Tests: Recent Labs  Lab 04/14/18 1325 04/19/2018 0751 04/19/18 1408 05-19-2018 0222  AST 144* 99*  --   --   ALT 15 9  --   --   ALKPHOS 73 80  --   --   BILITOT 1.1 1.1  --   --   PROT 6.4* 5.6*  --   --   ALBUMIN 1.9* 1.8* 1.5* 1.3*   No results for input(s): LIPASE, AMYLASE in the last 168 hours. Recent Labs  Lab 04/07/2018 1629 05/19/2018 0221  AMMONIA 18 40*    ABG    Component Value Date/Time   PHART 7.308 (L) 04/19/2018 1802   PCO2ART 27.8 (L) 04/19/2018 1802   PO2ART 284 (H) 04/19/2018 1802   HCO3 13.5 (L) 04/19/2018 1802   TCO2 28 01/18/2017 1346   ACIDBASEDEF 11.6 (H) 04/19/2018 1802   O2SAT 99.6 04/19/2018 1802     Coagulation Profile: Recent Labs  Lab 04/13/18 1209 04/14/18 0310 04/13/2018 0751 04/19/18 1408 05-19-18 0221  INR 2.56 2.42 3.4* 2.3* 3.0*    Cardiac Enzymes: No results for input(s): CKTOTAL, CKMB, CKMBINDEX, TROPONINI in the last 168 hours.  HbA1C: No results found for: HGBA1C  CBG: Recent Labs  Lab 04/19/18 1521 04/19/18 1910 04/19/18 2342 2018/05/19 0333 May 19, 2018 0728  GLUCAP 223* 283* 251* 203* 130*    Critical care time:       Rory Percy, DO PGY-2, [facility] 19-May-2018 9:26 AM

## 2018-04-23 NOTE — Progress Notes (Signed)
Patient ID: Albert [name], male   DOB: 11/10/1955, 63 y.o.   MRN: [id number] S: Overnight, Albert [name] became progressively more hypotensive and altered.  He was intubated and transferred to the ICU.  He is currently on pressors and workup is underway.  He was too unstable for IHD and PCCM placed temp HD cath in his RIJ in anticipation of initiating CVVHD  O:BP (!) 82/64   Pulse (!) 109   Temp 98.4 F (36.9 C) (Oral)   Resp (!) 24   Ht '6\' 2"'  (1.88 m)   Wt 91.2 kg   SpO2 100%   BMI 25.81 kg/m   Intake/Output Summary (Last 24 hours) at May 08, 2018 2993 Last data filed at 2018/05/08 0800 Gross per 24 hour  Intake 4548.45 ml  Output 0 ml  Net 4548.45 ml   Intake/Output: I/O last 3 completed shifts: In: 4428.9 [P.O.:120; I.V.:2428.5; Blood:315; IV Piggyback:1565.4] Out: 0   Intake/Output this shift:  Total I/O In: 239.5 [I.V.:239.5] Out: -  Weight change:  Gen: intubated and unresponsive, RIJ trialysis catheter in place. CVS: IRR IRR, tachy Resp: occ rhonchi Abd: +BS< soft, NT Ext: s/p bilateral BKA, no edema, LUE AVF +T/B  Recent Labs  Lab 04/14/18 1325 04/15/2018 0751 04/19/18 1408 2018-05-08 0222  NA  --  138 134* 134*  K  --  4.2 5.2* 5.8*  CL  --  102 98 101  CO2  --  18* 15* 11*  GLUCOSE  --  263* 287* 252*  BUN  --  57* 66* 66*  CREATININE  --  9.22* 11.11* 10.96*  ALBUMIN 1.9* 1.8* 1.5* 1.3*  CALCIUM  --  8.0* 8.5* 8.2*  PHOS  --   --  6.9* 8.6*  AST 144* 99*  --   --   ALT 15 9  --   --    Liver Function Tests: Recent Labs  Lab 04/14/18 1325 03/30/2018 0751 04/19/18 1408 05/08/2018 0222  AST 144* 99*  --   --   ALT 15 9  --   --   ALKPHOS 73 80  --   --   BILITOT 1.1 1.1  --   --   PROT 6.4* 5.6*  --   --   ALBUMIN 1.9* 1.8* 1.5* 1.3*   No results for input(s): LIPASE, AMYLASE in the last 168 hours. Recent Labs  Lab 04/13/2018 1629 05-08-18 0221  AMMONIA 18 40*   CBC: Recent Labs  Lab 04/14/18 0310 04/09/2018 0751 04/19/18 1408  05-08-2018 0221  WBC 17.8* 19.3* 29.7* 35.6*  NEUTROABS  --  16.3*  --   --   HGB 9.1* 7.9* 9.1* 9.8*  HCT 30.2* 29.5* 32.8* 35.0*  MCV 91.2 102.8* 99.7 101.7*  PLT 217 210 205 254   Cardiac Enzymes: No results for input(s): CKTOTAL, CKMB, CKMBINDEX, TROPONINI in the last 168 hours. CBG: Recent Labs  Lab 04/19/18 1521 04/19/18 1910 04/19/18 2342 05-08-18 0333 May 08, 2018 0728  GLUCAP 223* 283* 251* 203* 130*    Iron Studies: No results for input(s): IRON, TIBC, TRANSFERRIN, FERRITIN in the last 72 hours. Studies/Results: Dg Chest 2 View  Result Date: 04/19/2018 CLINICAL DATA:  Dyspnea. EXAM: CHEST - 2 VIEW COMPARISON:  Radiograph of April 18, 2018. FINDINGS: Stable cardiomegaly. Atherosclerosis of thoracic aorta is noted. No pneumothorax is noted. Hypoinflation of the lungs is noted with mild bibasilar subsegmental atelectasis. No significant pleural effusion is noted. Bony thorax is unremarkable. IMPRESSION: Hypoinflation of the lungs with mild bibasilar subsegmental atelectasis. Aortic  Atherosclerosis (ICD10-I70.0). Electronically Signed   By: Marijo Conception, M.D.   On: 04/19/2018 10:38   Dg Abd 1 View  Result Date: 04/19/2018 CLINICAL DATA:  Orogastric tube placement EXAM: ABDOMEN - 1 VIEW COMPARISON:  Portable exam 1542 hours compared to CT abdomen and pelvis of 11/14/2017 FINDINGS: Tip of orogastric tube projects over stomach though the proximal side-port is likely of just above the gastroesophageal junction at the distal esophagus; consider advancing tube 4 cm. Nonobstructive bowel gas pattern. Numerous surgical clips in the RIGHT upper quadrant and RIGHT mid abdomen. Bones demineralized. Bibasilar atelectasis. IMPRESSION: Considered advancing nasogastric tube 4 cm. Nonobstructive bowel gas pattern. Electronically Signed   By: Lavonia Dana M.D.   On: 04/19/2018 16:22   Ct Head Wo Contrast  Result Date: 03/27/2018 CLINICAL DATA:  Altered mental status. Hypotension. EXAM: CT HEAD  WITHOUT CONTRAST TECHNIQUE: Contiguous axial images were obtained from the base of the skull through the vertex without intravenous contrast. COMPARISON:  12/24/2003. FINDINGS: Brain: Interval mild enlargement of the subarachnoid spaces. Normal size and position of the ventricles. Minimal patchy white matter low density in both cerebral hemispheres. Small pituitary gland with no enlargement of the sella. No intracranial hemorrhage, mass lesion or CT evidence of acute infarction. Vascular: No hyperdense vessel or unexpected calcification. Skull: Normal. Negative for fracture or focal lesion. Sinuses/Orbits: Unremarkable. Other: None. IMPRESSION: 1. No acute abnormality. 2. Interval mild diffuse cerebral and cerebellar atrophy. 3. Minimal chronic small vessel white matter ischemic changes in both cerebral hemispheres. Electronically Signed   By: Claudie Revering M.D.   On: 04/09/2018 10:18   Dg Chest Port 1 View  Result Date: 04/19/2018 CLINICAL DATA:  Central line clotted EXAM: PORTABLE CHEST 1 VIEW COMPARISON:  04/19/2018 FINDINGS: Right internal jugular central line tip in the SVC. No pneumothorax. Endotracheal tube and NG tube are unchanged. Low volumes with bibasilar atelectasis. No visible effusions. Heart is borderline in size. IMPRESSION: Right central line tip in the SVC.  No pneumothorax. Low lung volumes, bibasilar atelectasis. Electronically Signed   By: Rolm Baptise M.D.   On: 04/19/2018 19:15   Portable Chest X-ray  Result Date: 04/19/2018 CLINICAL DATA:  Endotracheal tube placement EXAM: PORTABLE CHEST 1 VIEW COMPARISON:  Earlier same day FINDINGS: Endotracheal tube tip is 3 cm above the carina. Nasogastric tube enters the stomach. Lungs are better aerated, though there is persistent patchy infiltrate/atelectasis at both lung bases. IMPRESSION: Endotracheal tube well positioned 3 cm above the carina. Slightly better aeration of the lower lungs. Electronically Signed   By: Nelson Chimes M.D.   On:  04/19/2018 16:21   Ct Head Code Stroke Wo Contrast  Result Date: 04/19/2018 CLINICAL DATA:  Code stroke.  Altered mental status.  Lethargy. EXAM: CT HEAD WITHOUT CONTRAST TECHNIQUE: Contiguous axial images were obtained from the base of the skull through the vertex without intravenous contrast. COMPARISON:  03/29/2018 FINDINGS: Brain: There is no evidence of acute infarct, intracranial hemorrhage, mass, midline shift, or extra-axial fluid collection. The ventricles and sulci are within normal limits for age. Vascular: Calcified atherosclerosis at the skull base. No hyperdense vessel. Skull: No fracture or focal osseous lesion. Sinuses/Orbits: Mild bilateral ethmoid air cell mucosal thickening. Clear mastoid air cells. Unremarkable orbits. Other: None. ASPECTS [facility] Stroke Program Early CT Score) Not scored with this history. IMPRESSION: No evidence of acute intracranial abnormality. These results were communicated to Dr. Rory Percy at 3:02 pm on 04/19/2018 by text page via the [facility] messaging system. Electronically Signed  By: Logan Bores M.D.   On: 04/19/2018 15:02   . aspirin  81 mg Oral Daily  . chlorhexidine gluconate (MEDLINE KIT)  15 mL Mouth Rinse BID  . Chlorhexidine Gluconate Cloth  6 each Topical Q0600  . [START ON 04/22/2018] darbepoetin (ARANESP) injection - DIALYSIS  100 mcg Intravenous Q Sat-HD  . [START ON 04/21/2018] doxercalciferol  3.5 mcg Intravenous Q M,W,F-HD  . insulin aspart  0-9 Units Subcutaneous Q4H  . mouth rinse  15 mL Mouth Rinse 10 times per day  . midodrine  10 mg Oral TID WC  . sodium chloride flush  3 mL Intravenous Q12H  . sodium zirconium cyclosilicate  5 g Oral BID    BMET    Component Value Date/Time   NA 134 (L) 2018-04-27 0222   K 5.8 (H) 2018/04/27 0222   CL 101 04/27/18 0222   CO2 11 (L) 04-27-2018 0222   GLUCOSE 252 (H) 2018-04-27 0222   BUN 66 (H) 27-Apr-2018 0222   CREATININE 10.96 (H) 04-27-18 0222   CALCIUM 8.2 (L) April 27, 2018 0222   CALCIUM  9.0 03/21/2007 0415   GFRNONAA 4 (L) 04/27/2018 0222   GFRAA 5 (L) Apr 27, 2018 0222   CBC    Component Value Date/Time   WBC 35.6 (H) 2018-04-27 0221   RBC 3.44 (L) 04/27/18 0221   HGB 9.8 (L) 2018/04/27 0221   HCT 35.0 (L) April 27, 2018 0221   PLT 254 04/27/18 0221   MCV 101.7 (H) 27-Apr-2018 0221   MCH 28.5 27-Apr-2018 0221   MCHC 28.0 (L) April 27, 2018 0221   RDW 17.4 (H) 04-27-2018 0221   LYMPHSABS 1.3 04/15/2018 0751   MONOABS 1.4 (H) 04/08/2018 0751   EOSABS 0.1 03/25/2018 0751   BASOSABS 0.0 03/27/2018 0751     Dialysis Orders: Center: [facility]  on TTS . EDW 97kg HD Bath 2K/2.5Ca  Time  4 hours 15 minutes Heparin none. Access LUE AVF BFR 300 DFR 600    Hectoral 3.5 mcg IV/HD   Assessment/Plan: 1. Subacute Encephalopathy with worsening overnight.  Neurology consulted but code stroke cancelled due to duration of AMS. CT scan without acute changes  2. VDRF- per PCCM 3. Sepsis from HCAP- pt with AMS, and hypotension and transferred from [facility] to [facility]. 4.  ESRD -   Will plan for CVVHD today since he is now on pressors and ventilator.   1. Given hyperkalemia and severe metabolic acidosis will use 4K/2.5Ca dialysate but replacement fluids will be 0K/2.5Ca pre and isotonic bicarb post and follow.  5. Atrial fibrillation with RVR- HD initially on hold due to HR in the 130's but improved with IV dilt.  6. AMS- presumably metabolic encephalopathy related to hypoxia, sepsis, and hypotension.  Continue to follow as we treat the underlying issues.  7. Hypertension/volume  - plan for HD today.  Improved BP with rate control and abx for PNA 8.  Anemia  - drop in Hgb and agree with blood transfusion with HD will add ESA as well. 9.  Metabolic bone disease -  Continue with meds when able to take po 10.  Nutrition -  Renal diet when able to take po 11. PVD s/p bilateral BKA's- some duskiness along incisions but no drainage or erythema   Donetta Potts, MD [facility] [phone]

## 2018-04-23 NOTE — Progress Notes (Signed)
EEG complete - results pending 

## 2018-04-23 NOTE — Progress Notes (Signed)
RT NOTE: RT one-way extubated patient with RN at bedside per MD order and family wishes.  

## 2018-04-23 NOTE — Progress Notes (Addendum)
PCCM:  Attempted to call family at telephone numbers available in the chart. I spoke with patients sister bettie.  I explained he has escalating vasopressor requirements.  He is becoming more hypotensive and not tolerating CRRT.  I suspect he is progressing further into multiorgan failure.  Unfortunately the prognosis is very poor and I suspect he may pass away from this disease.  I have informed the family.  Garner Nash, DO [facility] May 04, 2018 12:49 PM  Personal pager: [phone] If unanswered, please page CCM On-call: [phone]   Family meeting this afternoon. Patient is severe MODS. Patient has living will stating he would not want this type of treatment. Two sisters and daughter present. Decision made to transition to comfort care. All orders placed.   I suspect the patient will pass quickly. He is maxed on multiple vasopressors.   Garner Nash, DO [facility] 2018-05-04 2:43 PM

## 2018-04-23 NOTE — Telephone Encounter (Signed)
sch appt lvm mld ltr 05/19/2018 330pm p/o MD

## 2018-04-23 NOTE — Progress Notes (Signed)
Inpatient Diabetes Program Recommendations  AACE/ADA: New Consensus Statement on Inpatient Glycemic Control (2015)  Target Ranges:  Prepandial:   less than 140 mg/dL      Peak postprandial:   less than 180 mg/dL (1-2 hours)      Critically ill patients:  140 - 180 mg/dL   Lab Results  Component Value Date   GLUCAP 130 (H) 05-15-2018   Review of Glycemic Control  Diabetes history: DM 2 Outpatient Diabetes medications: Glipizide 5 mg Daily, Tradjenta 5 mg Daily Current orders for Inpatient glycemic control: Novolog 0-9 units Q4 hours  Inpatient Diabetes Program Recommendations:    Patient intubated, NPO. Please d/c current Glycemic control orders and order the ICU Glycemic Control order set, Phase 1 SQ insulin Q4 hours. This order set allows RN to switch to IV insulin if need be in the 200 range to keep glucose levels down to improve morbidity and mortality.  Thanks,  Tama Headings RN, MSN, BC-ADM Inpatient Diabetes Coordinator Team Pager [phone] (8a-5p)

## 2018-04-23 NOTE — Progress Notes (Signed)
Hoffman, NP, aware of hypotension and initiation/increased titration of levophed.  Donnetta Simpers, RN

## 2018-04-23 NOTE — Progress Notes (Addendum)
NEUROLOGY PROGRESS NOTE  Subjective: Intubated, no response to pain.  Exam: Vitals:   17-May-2018 0837 05-17-2018 0845  BP: (!) 73/59 (!) 82/64  Pulse:  (!) 109  Resp: (!) 24 (!) 24  Temp:    SpO2:  100%    Physical Exam   HEENT-  Normocephalic, no lesions, without obvious abnormality.  Normal external eye and conjunctiva.  Extremities-bilateral BKA Musculoskeletal-no joint tenderness, deformity or swelling Skin-warm and dry, no hyperpigmentation, vitiligo, or suspicious lesions Neuro:  Mental Status: Intubated, not following commands, not responding to pain- was agitated thus fentanyl was initiated however was turned off at 0630. Cranial Nerves: Pupils round and sluggishly reactive, does not blink to threat from either side, difficult to ascertain facial symmetry due to intubation Motor: no spontaneous movement of extremities.  No withdrawal to noxious stimulation. Sensory: As above    Medications:  Scheduled: . aspirin  81 mg Oral Daily  . chlorhexidine gluconate (MEDLINE KIT)  15 mL Mouth Rinse BID  . Chlorhexidine Gluconate Cloth  6 each Topical Q0600  . [START ON 04/22/2018] darbepoetin (ARANESP) injection - DIALYSIS  100 mcg Intravenous Q Sat-HD  . [START ON 04/21/2018] doxercalciferol  3.5 mcg Intravenous Q M,W,F-HD  . insulin aspart  0-9 Units Subcutaneous Q4H  . lactulose  10 g Oral BID  . mouth rinse  15 mL Mouth Rinse 10 times per day  . midodrine  10 mg Oral TID WC  . pantoprazole sodium  40 mg Per Tube Daily  . sodium chloride flush  3 mL Intravenous Q12H  . sodium zirconium cyclosilicate  5 g Oral BID   Continuous: . sodium chloride 250 mL (04/19/18 2212)  . amiodarone 30 mg/hr (2018-05-17 0800)  . ceFEPime (MAXIPIME) IV    . fentaNYL infusion INTRAVENOUS Stopped (May 17, 2018 0631)  . heparin 10,000 units/ 20 mL infusion syringe    . norepinephrine (LEVOPHED) Adult infusion 4 mcg/min (05/17/2018 0800)  . prismasol BGK 0/2.5    . prismasol BGK 4/2.5    . sodium  bicarbonate (isotonic) 1000 mL infusion    . vasopressin (PITRESSIN) infusion - *FOR SHOCK* 0.03 Units/min (05/17/2018 0932)    Pertinent Labs/Diagnostics: EEG 04/19/2018--EEG done today shows generalized slowing consistent with diffuse encephalopathy EEG reordered for 17-May-2018- pending -B12 [id number] - Ammonia 40  Etta Quill PA-C [facility] [phone]   ATTENDING ADDENDUM Pt seen and examined. Agree with examination above. I have independently performed examination and please see assessment plan below.  Assessment: 63 year old male with past medical history of atrial fibrillation on Coumadin, recently admitted for septic shock from right sided pneumonia/H CAP, recent DKA history of essential hypertension and orthostatic hypotension.  Neurology saw patient secondary to worsening altered mental status and possible right-sided weakness.  As noted in previous note his exam was consistent with generalized encephalopathy.  EEG done yesterday showed no epileptiform activity.  He has multiple metabolic derangements including severely deranged renal function, which might be causing his current presentation. MRI has been ordered at this time to look for any structural lesions versus stroke.   Follow-up EEG has also been ordered secondary to patient continuing to be very lethargic.  Impression:  -Toxic metabolic encephalopathy - Evaluate for stroke with MRI  Recommendations: -Medical management per PCCM to correct toxic metabolic derangements -MRI brain when stable - EEG today  Neurology will continue to follow with you.  2018-05-17, 10:08 AM  -- Amie Portland, MD [facility] Pager: [phone] If 7pm to 7am, please call on call as listed  on AMION.   CRITICAL CARE ATTESTATION Performed by: Amie Portland, MD Total critical care time: 20 minutes Critical care time was exclusive of separately billable procedures and treating other patients and/or supervising  APPs/Residents/Students Critical care was necessary to treat or prevent imminent or life-threatening deterioration due to toxic metabolic encephalopathy This patient is critically ill and at significant risk for neurological worsening and/or death and care requires constant monitoring. Critical care was time spent personally by me on the following activities: development of treatment plan with patient and/or surrogate as well as nursing, discussions with consultants, evaluation of patient's response to treatment, examination of patient, obtaining history from patient or surrogate, ordering and performing treatments and interventions, ordering and review of laboratory studies, ordering and review of radiographic studies, pulse oximetry, re-evaluation of patient's condition, participation in multidisciplinary rounds and medical decision making of high complexity in the care of this patient.

## 2018-04-23 NOTE — Death Summary Note (Signed)
DEATH SUMMARY   Patient Details  Name: Albert [name] MRN: [id number] DOB: 30-Apr-1955  Admission/Discharge Information   Admit Date:  05-05-2018  Date of Death: Date of Death: May 07, 2018  Time of Death: Time of Death: 47  Length of Stay: 2  Referring Physician: Redmond School, MD   Reason(s) for Hospitalization  Shock  Diagnoses  Preliminary cause of death: Septic shock ([place]) Secondary Diagnoses (including complications and co-morbidities):  Principal Problem:   Septic shock ([place]) Active Problems:   OBSTRUCTIVE SLEEP APNEA   Essential hypertension, benign   Atrial fibrillation ([place])   Long term (current) use of anticoagulants   ESRD (end stage renal disease) ([place])   Metabolic acidosis   Acute respiratory failure with hypercapnia ([place])   Anemia   PVD (peripheral vascular disease) ([place])   ESRD on dialysis ([place])   Pressure injury of skin   Hemodialysis-associated hypotension   Sepsis ([place])   Healthcare-associated pneumonia-----[facility] Course (including significant findings, care, treatment, and services provided and events leading to death)  Albert [name] is a 63 y.o. year old male who 63 year old male with past medical history as below, which is significant for ESRD (TTS), atrial fibrillation on chronic anticoagulation with Coumadin, diabetes complicated by peripheral neuropathy, and peripheral artery disease requiring him to undergo bilateral below the knee amputation on 04/08/2018, and 04/11/2018.  He was discharged to SNF where family reports his mental status was initially intact.  This slowly deteriorated.  He presented for dialysis on May 05, 2022 where he was found to be not in his usual mental status and was hypotensive to 67/51.  In the emergency department his blood pressure somewhat improved with IV fluids and imaging was concerning for right-sided pneumonia.  He was admitted for presumed septic shock secondary to pneumonia. He was  treated with cefepime and vancomycin and admitted to progressive care unit. 2/26 he was less responsive and developed AF RVR. He was started on diltiazem, which needed to be discontinued due to hypotension. He remained hypotensive and PCCM was consulted.   Patient had progressive respiratory failure and septic shock secondary to hospital-acquired pneumonia.  Also has significant vascular disease.  Patient had progressive renal failure and requiring multiple vasopressors with end-stage renal disease on dialysis.  Further discussions with the family at bedside as we were adding additional vasopressors nephrology felt unable to add dialysis due to persistent hypotension.  Further discussions with family at the bedside regarding his prognosis.  The family made the decision to transition to comfort measures.  The patient was made comfortable and removed from mechanical ventilation and patient passed peacefully with family at bedside.  Pertinent Labs and Studies  Significant Diagnostic Studies Dg Chest 2 View  Result Date: 04/19/2018 CLINICAL DATA:  Dyspnea. EXAM: CHEST - 2 VIEW COMPARISON:  Radiograph of May 05, 2018. FINDINGS: Stable cardiomegaly. Atherosclerosis of thoracic aorta is noted. No pneumothorax is noted. Hypoinflation of the lungs is noted with mild bibasilar subsegmental atelectasis. No significant pleural effusion is noted. Bony thorax is unremarkable. IMPRESSION: Hypoinflation of the lungs with mild bibasilar subsegmental atelectasis. Aortic Atherosclerosis (ICD10-I70.0). Electronically Signed   By: Marijo Conception, M.D.   On: 04/19/2018 10:38   Dg Abd 1 View  Result Date: 04/19/2018 CLINICAL DATA:  Orogastric tube placement EXAM: ABDOMEN - 1 VIEW COMPARISON:  Portable exam 1542 hours compared to CT abdomen and pelvis of 11/14/2017 FINDINGS: Tip of orogastric tube projects over stomach though the proximal side-port is likely of  just above the gastroesophageal junction at the distal  esophagus; consider advancing tube 4 cm. Nonobstructive bowel gas pattern. Numerous surgical clips in the RIGHT upper quadrant and RIGHT mid abdomen. Bones demineralized. Bibasilar atelectasis. IMPRESSION: Considered advancing nasogastric tube 4 cm. Nonobstructive bowel gas pattern. Electronically Signed   By: Lavonia Dana M.D.   On: 04/19/2018 16:22   Ct Head Wo Contrast  Result Date: 03/25/2018 CLINICAL DATA:  Altered mental status. Hypotension. EXAM: CT HEAD WITHOUT CONTRAST TECHNIQUE: Contiguous axial images were obtained from the base of the skull through the vertex without intravenous contrast. COMPARISON:  12/24/2003. FINDINGS: Brain: Interval mild enlargement of the subarachnoid spaces. Normal size and position of the ventricles. Minimal patchy white matter low density in both cerebral hemispheres. Small pituitary gland with no enlargement of the sella. No intracranial hemorrhage, mass lesion or CT evidence of acute infarction. Vascular: No hyperdense vessel or unexpected calcification. Skull: Normal. Negative for fracture or focal lesion. Sinuses/Orbits: Unremarkable. Other: None. IMPRESSION: 1. No acute abnormality. 2. Interval mild diffuse cerebral and cerebellar atrophy. 3. Minimal chronic small vessel white matter ischemic changes in both cerebral hemispheres. Electronically Signed   By: Claudie Revering M.D.   On: 03/26/2018 10:18   Dg Chest Port 1 View  Result Date: 04/19/2018 CLINICAL DATA:  Central line clotted EXAM: PORTABLE CHEST 1 VIEW COMPARISON:  04/19/2018 FINDINGS: Right internal jugular central line tip in the SVC. No pneumothorax. Endotracheal tube and NG tube are unchanged. Low volumes with bibasilar atelectasis. No visible effusions. Heart is borderline in size. IMPRESSION: Right central line tip in the SVC.  No pneumothorax. Low lung volumes, bibasilar atelectasis. Electronically Signed   By: Rolm Baptise M.D.   On: 04/19/2018 19:15   Portable Chest X-ray  Result Date:  04/19/2018 CLINICAL DATA:  Endotracheal tube placement EXAM: PORTABLE CHEST 1 VIEW COMPARISON:  Earlier same day FINDINGS: Endotracheal tube tip is 3 cm above the carina. Nasogastric tube enters the stomach. Lungs are better aerated, though there is persistent patchy infiltrate/atelectasis at both lung bases. IMPRESSION: Endotracheal tube well positioned 3 cm above the carina. Slightly better aeration of the lower lungs. Electronically Signed   By: Nelson Chimes M.D.   On: 04/19/2018 16:21   Dg Chest Port 1 View  Result Date: 04/05/2018 CLINICAL DATA:  Fever with hypoxia EXAM: PORTABLE CHEST 1 VIEW COMPARISON:  January 04, 2017 FINDINGS: There is focal airspace consolidation in the medial left base. There is atelectasis with apparent superimposed infiltrate in the right base. The lungs elsewhere are clear. Heart is borderline enlarged with pulmonary vascularity normal. No adenopathy. There is aortic atherosclerosis. Note bony bridge between the anterior right first and second ribs, a likely anatomic variant. There is degenerative change in the left shoulder. IMPRESSION: Patchy airspace consolidation in the lung bases with associated atelectasis in the right base. Heart is borderline enlarged. Aortic Atherosclerosis (ICD10-I70.0). Electronically Signed   By: Lowella Grip III M.D.   On: 04/02/2018 07:49   Vas [place] Lower Extremity Saphenous Vein Mapping  Result Date: 03/24/2018 LOWER EXTREMITY VEIN MAPPING History: History of PAD; patient is pre-operative for lower extremity bypass          graft.  Performing Technologist: June Leap RDMS, RVT  Examination Guidelines: A complete evaluation includes B-mode imaging, spectral Doppler, color Doppler, and power Doppler as needed of all accessible portions of each vessel. Bilateral testing is considered an integral part of a complete examination. Limited examinations for reoccurring indications may be performed as noted.  +---------------+-----------+----------------------+---------------+-----------+  RT Diameter  RT Findings         GSV            LT Diameter  LT Findings      (cm)                                            (cm)                  +---------------+-----------+----------------------+---------------+-----------+      0.62                     Saphenofemoral         0.42                                                   Junction                                  +---------------+-----------+----------------------+---------------+-----------+      0.35                     Proximal thigh         0.38                  +---------------+-----------+----------------------+---------------+-----------+      0.30                       Mid thigh            0.35                  +---------------+-----------+----------------------+---------------+-----------+      0.28                      Distal thigh          0.40                  +---------------+-----------+----------------------+---------------+-----------+      0.41       branching          Knee              0.27                  +---------------+-----------+----------------------+---------------+-----------+      0.34                       Prox calf            0.28                  +---------------+-----------+----------------------+---------------+-----------+      0.30       branching        Mid calf            0.27       branching  +---------------+-----------+----------------------+---------------+-----------+      0.23                      Distal calf           0.36                  +---------------+-----------+----------------------+---------------+-----------+  Diagnosing physician: Curt Jews MD Electronically signed by Curt Jews MD on 03/24/2018 at 4:53:39 PM.    Final    Ct Head Code Stroke Wo Contrast  Result Date: 04/19/2018 CLINICAL DATA:  Code stroke.  Altered mental status.  Lethargy. EXAM: CT  HEAD WITHOUT CONTRAST TECHNIQUE: Contiguous axial images were obtained from the base of the skull through the vertex without intravenous contrast. COMPARISON:  03/25/2018 FINDINGS: Brain: There is no evidence of acute infarct, intracranial hemorrhage, mass, midline shift, or extra-axial fluid collection. The ventricles and sulci are within normal limits for age. Vascular: Calcified atherosclerosis at the skull base. No hyperdense vessel. Skull: No fracture or focal osseous lesion. Sinuses/Orbits: Mild bilateral ethmoid air cell mucosal thickening. Clear mastoid air cells. Unremarkable orbits. Other: None. ASPECTS [facility] Stroke Program Early CT Score) Not scored with this history. IMPRESSION: No evidence of acute intracranial abnormality. These results were communicated to Dr. Rory Percy at 3:02 pm on 04/19/2018 by text page via the [facility] messaging system. Electronically Signed   By: Logan Bores M.D.   On: 04/19/2018 15:02    Microbiology Recent Results (from the past 240 hour(s))  Blood Culture (routine x 2)     Status: None (Preliminary result)   Collection Time: 04/09/2018  7:22 AM  Result Value Ref Range Status   Specimen Description BLOOD RIGHT HAND  Final   Special Requests   Final    BOTTLES DRAWN AEROBIC AND ANAEROBIC Blood Culture adequate volume   Culture   Final    NO GROWTH 3 DAYS Performed at [facility], [address] [place], [place] [address]    Report Status PENDING  Incomplete  Blood Culture (routine x 2)     Status: None (Preliminary result)   Collection Time: 04/16/2018  7:51 AM  Result Value Ref Range Status   Specimen Description BLOOD RIGHT ANTECUBITAL  Final   Special Requests   Final    BOTTLES DRAWN AEROBIC AND ANAEROBIC Blood Culture adequate volume   Culture   Final    NO GROWTH 3 DAYS Performed at [facility], [address], [place], [place] [address]    Report Status PENDING  Incomplete  Respiratory Panel by PCR     Status: None   Collection Time: 04/19/18 12:56  PM  Result Value Ref Range Status   Adenovirus NOT DETECTED NOT DETECTED Final   Coronavirus 229E NOT DETECTED NOT DETECTED Final    Comment: (NOTE) The Coronavirus on the Respiratory Panel, DOES NOT test for the novel  Coronavirus (2019 nCoV)    Coronavirus HKU1 NOT DETECTED NOT DETECTED Final   Coronavirus NL63 NOT DETECTED NOT DETECTED Final   Coronavirus OC43 NOT DETECTED NOT DETECTED Final   Metapneumovirus NOT DETECTED NOT DETECTED Final   Rhinovirus / Enterovirus NOT DETECTED NOT DETECTED Final   Influenza A NOT DETECTED NOT DETECTED Final   Influenza B NOT DETECTED NOT DETECTED Final   Parainfluenza Virus 1 NOT DETECTED NOT DETECTED Final   Parainfluenza Virus 2 NOT DETECTED NOT DETECTED Final   Parainfluenza Virus 3 NOT DETECTED NOT DETECTED Final   Parainfluenza Virus 4 NOT DETECTED NOT DETECTED Final   Respiratory Syncytial Virus NOT DETECTED NOT DETECTED Final   Bordetella pertussis NOT DETECTED NOT DETECTED Final   Chlamydophila pneumoniae NOT DETECTED NOT DETECTED Final   Mycoplasma pneumoniae NOT DETECTED NOT DETECTED Final    Comment: Performed at [facility], [place] [address], [place], [place] [address]    Lab Basic Metabolic Panel:  Recent Labs  Lab 04/11/2018 0751 04/19/18 1408 05/09/2018 0222  NA 138 134* 134*  K 4.2 5.2* 5.8*  CL 102 98 101  CO2 18* 15* 11*  GLUCOSE 263* 287* 252*  BUN 57* 66* 66*  CREATININE 9.22* 11.11* 10.96*  CALCIUM 8.0* 8.5* 8.2*  PHOS  --  6.9* 8.6*   Liver Function Tests: Recent Labs  Lab 04/02/2018 0751 04/19/18 1408 May 09, 2018 0222  AST 99*  --   --   ALT 9  --   --   ALKPHOS 80  --   --   BILITOT 1.1  --   --   PROT 5.6*  --   --   ALBUMIN 1.8* 1.5* 1.3*   No results for input(s): LIPASE, AMYLASE in the last 168 hours. Recent Labs  Lab 03/29/2018 1629 May 09, 2018 0221  AMMONIA 18 40*   CBC: Recent Labs  Lab 04/15/2018 0751 04/19/18 1408 05/09/2018 0221  WBC 19.3* 29.7* 35.6*  NEUTROABS 16.3*  --   --   HGB 7.9*  9.1* 9.8*  HCT 29.5* 32.8* 35.0*  MCV 102.8* 99.7 101.7*  PLT 210 205 254   Cardiac Enzymes: Recent Labs  Lab 09-May-2018 1320  TROPONINI 0.12*   Sepsis Labs: Recent Labs  Lab 04/08/2018 0751 04/12/2018 0959 04/14/2018 1500 04/19/18 1408 May 09, 2018 0221  WBC 19.3*  --   --  29.7* 35.6*  LATICACIDVEN 5.6* 2.8* 1.9  --   --     Procedures/Operations    Leory Plowman L Suheily Birks 04/21/2018, 6:11 PM

## 2018-04-23 NOTE — Progress Notes (Signed)
Pt was pronounced by Barbaraann Rondo RN and Charity fundraiser. MD has been informed. Family at bedside.

## 2018-04-23 NOTE — Telephone Encounter (Signed)
-----   Message from Albert [name] sent at 04/19/2018  9:27 AM EST -----   ----- Message ----- From: Albert [name] Sent: 04/14/2018   8:47 AM EST To: [facility] Admin Pool, [facility] Charge Pool  S/p Bilateral BKA  .. f/u with DR. CAin in 4 weeks.  Thanks

## 2018-04-23 DEATH — deceased

## 2018-04-24 ENCOUNTER — Telehealth: Payer: Self-pay

## 2018-04-24 NOTE — Telephone Encounter (Signed)
Received original copy of dc from [employer].  DC is for burial. Patient is a patient of [facility].   DC will be taken to Pulmonary Unit for signature.   On 04/26/2018 Received dc back from Doctor Icard. Called the funeral home to let them know the dc is ready for pickup.

## 2018-04-28 ENCOUNTER — Ambulatory Visit: Payer: Medicare HMO | Admitting: Family

## 2018-05-03 ENCOUNTER — Ambulatory Visit: Payer: Medicare HMO | Admitting: Student

## 2018-05-19 ENCOUNTER — Encounter: Payer: Medicare HMO | Admitting: Vascular Surgery

## 2018-09-10 IMAGING — US US BREAST*L* LIMITED INC AXILLA
1 series · 4 of 4 positions shown · non-contrast
Comparison: None

CLINICAL DATA: Fullness and tenderness within the subareolar left
breast. The patient is on hemodialysis.

EXAM:
2D DIGITAL DIAGNOSTIC BILATERAL MAMMOGRAM WITH CAD AND ADJUNCT TOMO
ULTRASOUND LEFT BREAST

[Series 1: us breast*left* limited inc axilla · 0.08mm/px · 4 of 4 slices shown]
[im 1/4]
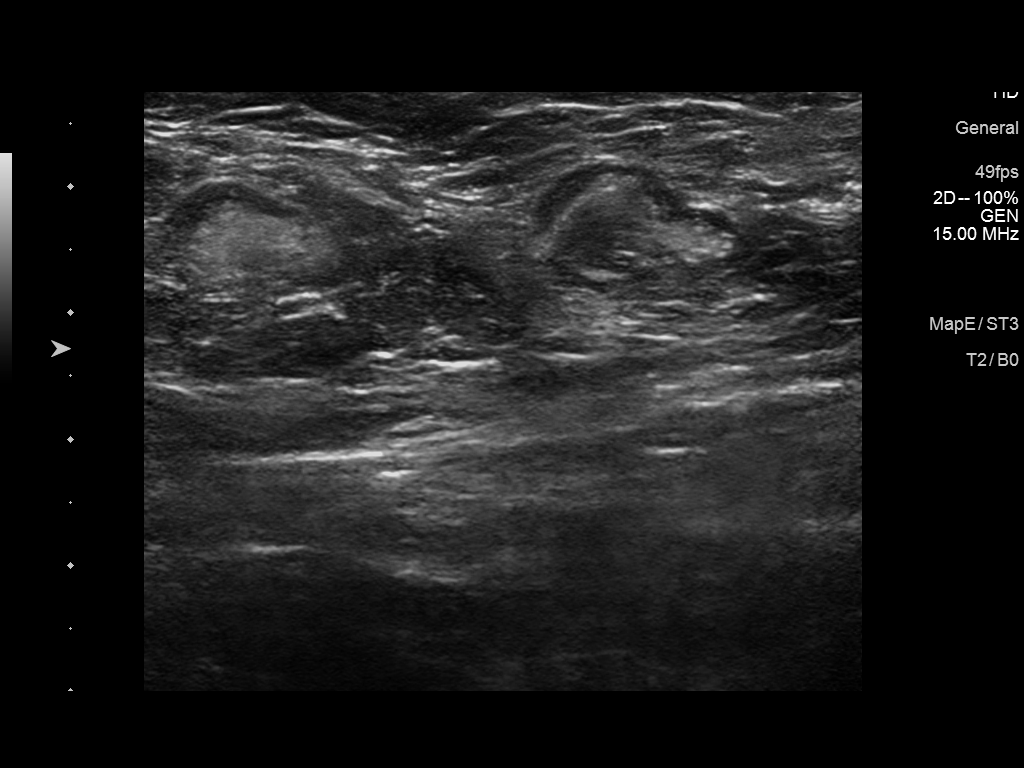
[im 2/4]
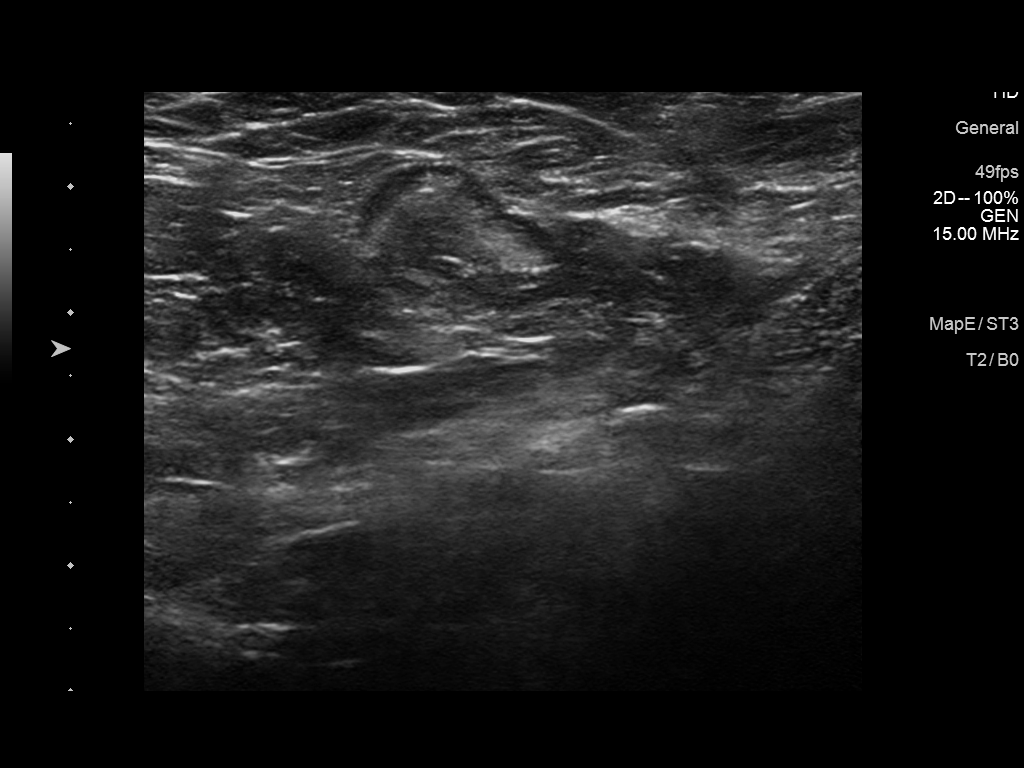
[im 3/4]
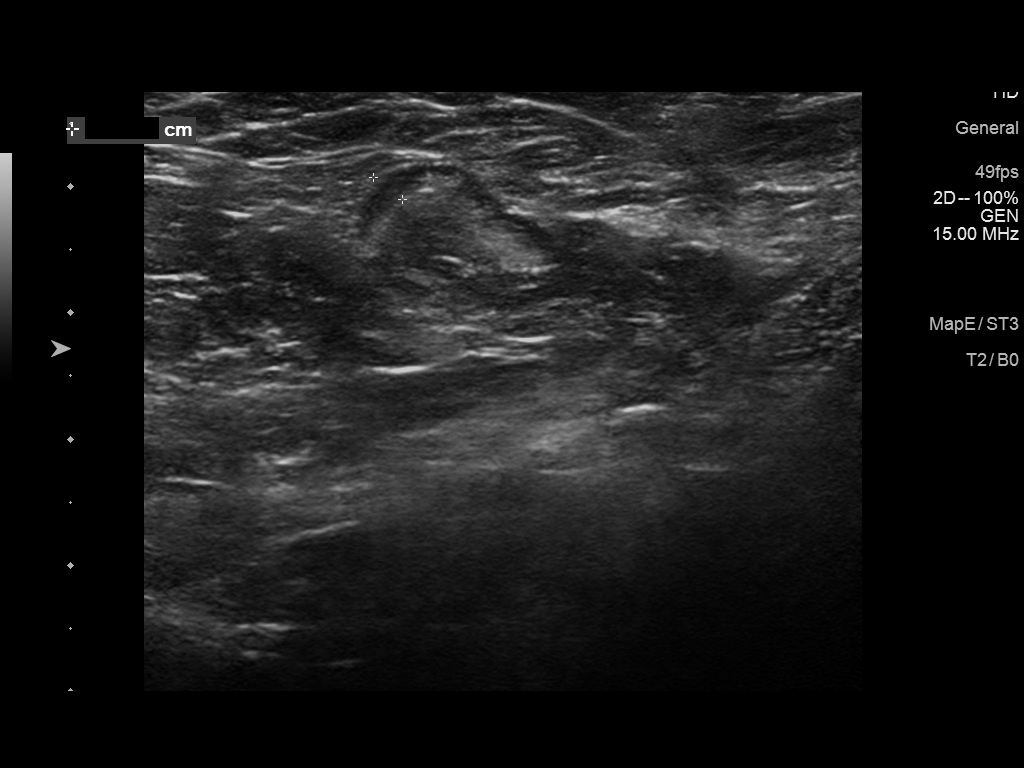
[im 4/4]
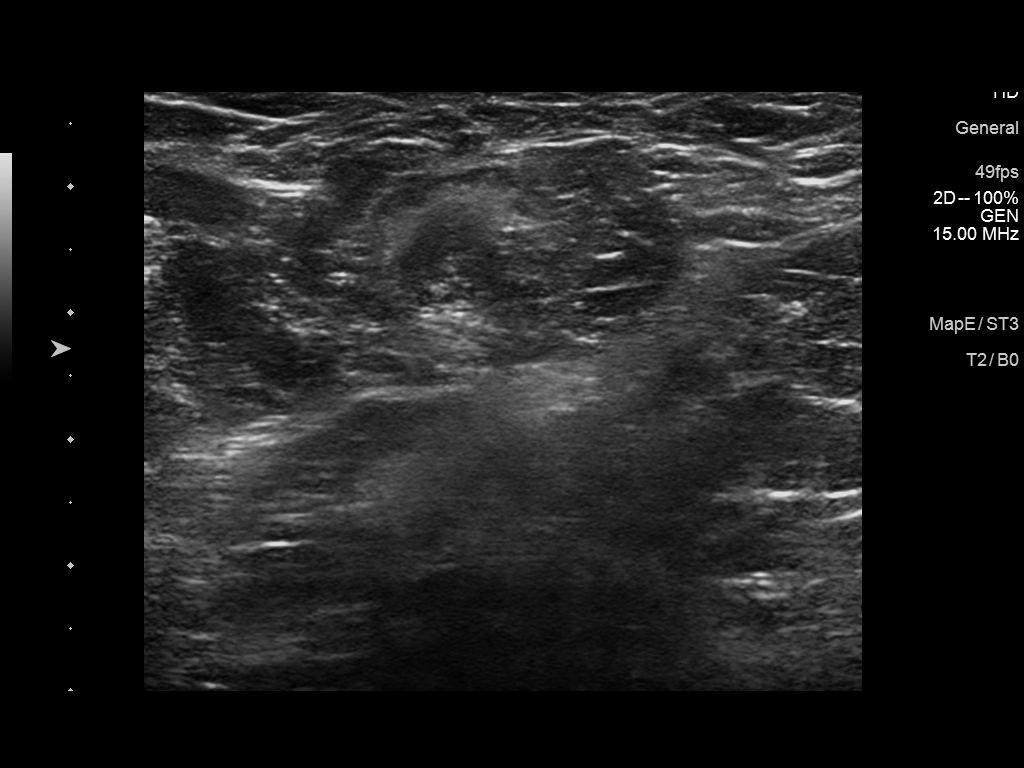

[4 of 4 positions shown; findings below may reference images not displayed]

ACR Breast Density Category b: There are scattered areas of
fibroglandular density.
FINDINGS: There is asymmetrical fibroglandular tissue within the subareolar
left breast which on mammography is most consistent with
gynecomastia. Arterial vascular calcifications present bilaterally.
There is a prominent left axillary lymph node noted on the left MLO
view.

Mammographic images were processed with CAD.

On physical exam, there is soft fullness within the subareolar left
breast. There is no palpable breast mass and there is no palpable
left axillary adenopathy.

Targeted ultrasound is performed, showing mildly prominent
fibroglandular tissue within the subareolar left breast consistent
with gynecomastia. There is no mass. Also, ultrasound of the left
axilla demonstrates normal appearing left axillary lymph nodes with
normally preserved central hilar fat and normal symmetrical cortical
thickness.
IMPRESSION: Findings are consistent with mild gynecomastia. No findings
worrisome for malignancy.

RECOMMENDATION:
Follow-up on breast self-examination.

I have discussed the findings and recommendations with the patient.
Results were also provided in writing at the conclusion of the
visit. If applicable, a reminder letter will be sent to the patient
regarding the next appointment.

BI-RADS CATEGORY  2: Benign.

## 2018-12-27 IMAGING — US US ABDOMEN COMPLETE
1 series · 13 of 25 positions shown · non-contrast
Comparison: Abdomen MRI 01/05/2017. CT Abdomen and Pelvis
05/07/2016, and earlier.

CLINICAL DATA: 61-year-old male with generalized abdominal pain,
swelling. History of renal cell carcinoma, a end-stage renal disease
on hemodialysis.

EXAM:
ABDOMEN ULTRASOUND COMPLETE

[Series 1: us abdomen complete · 0.24mm/px · 13 of 78 slices shown]
[im 1/78]
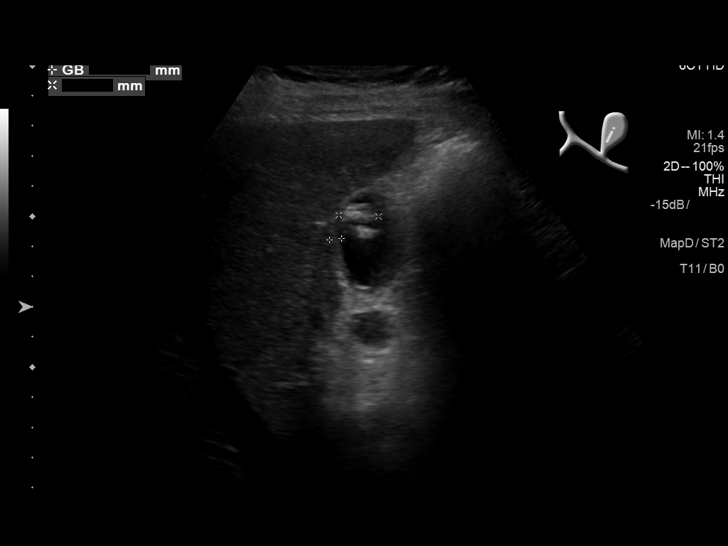
[im 7/78]
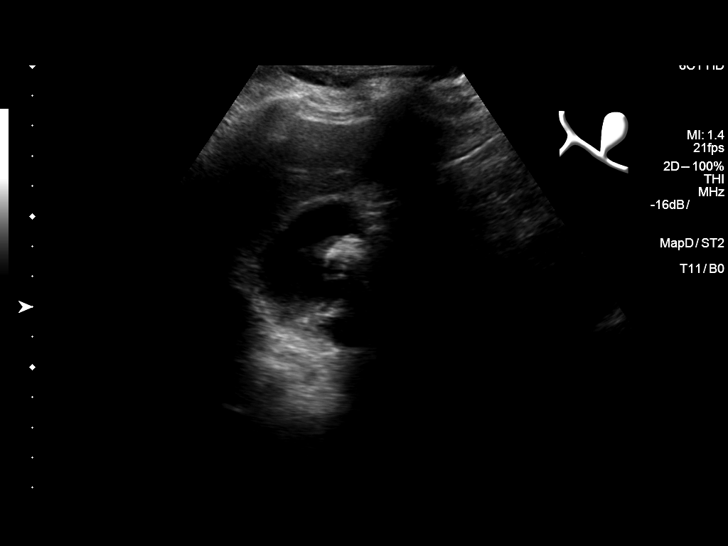
[im 13/78]
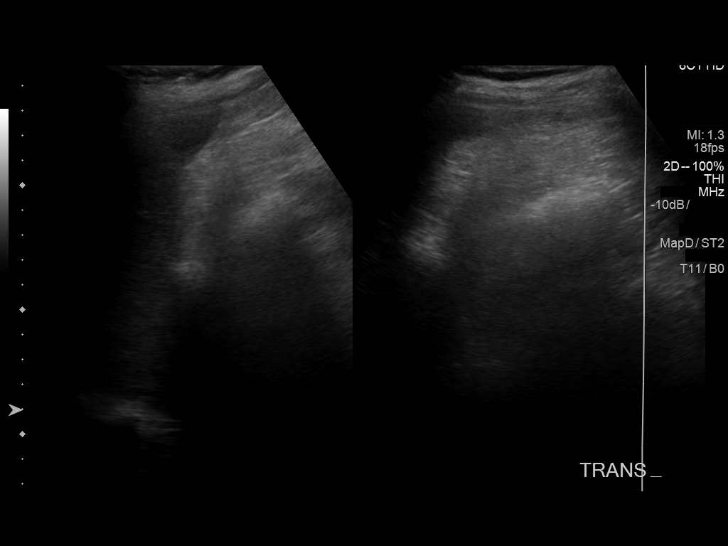
[im 20/78]
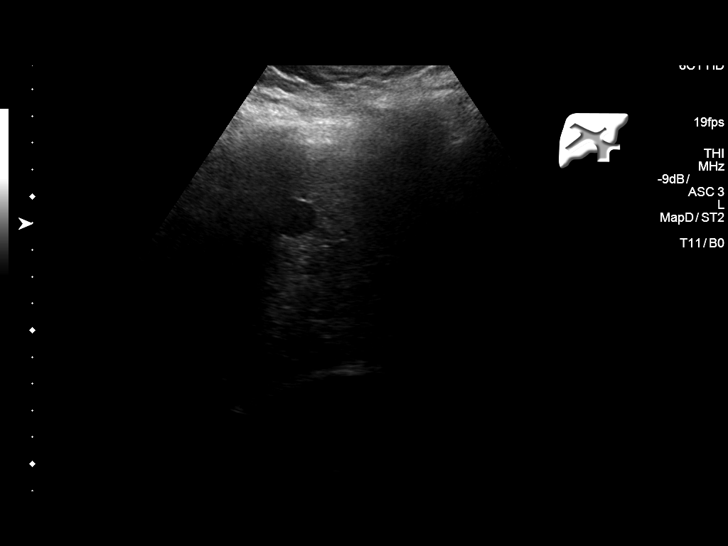
[im 26/78]
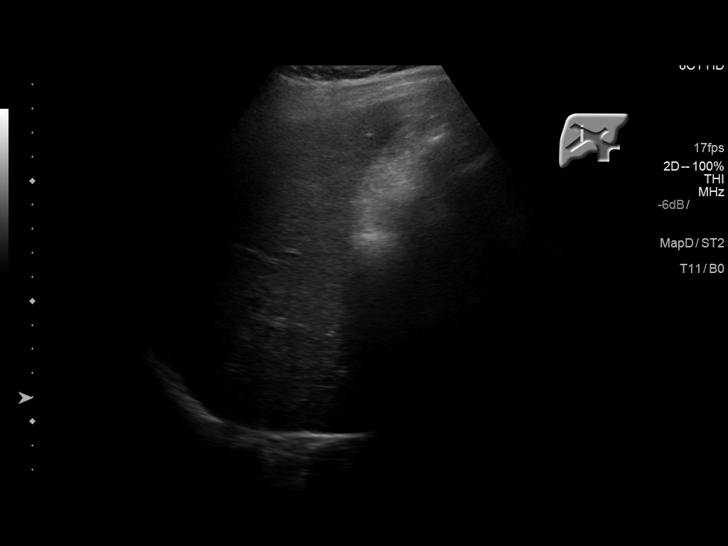
[im 33/78]
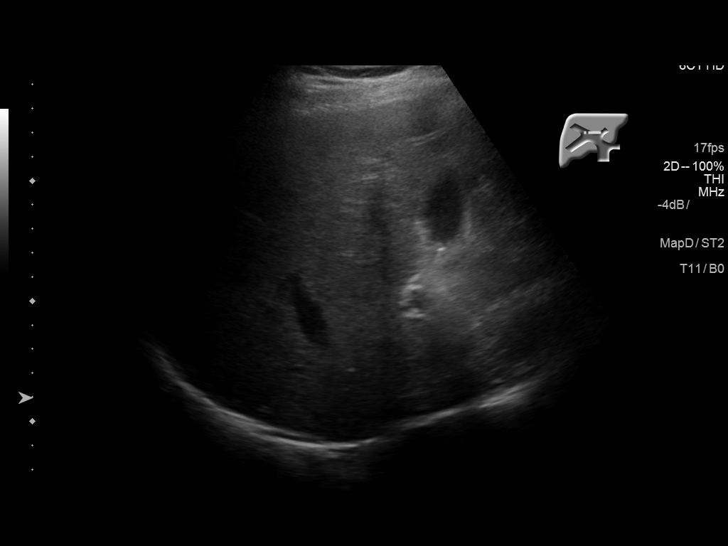
[im 39/78]
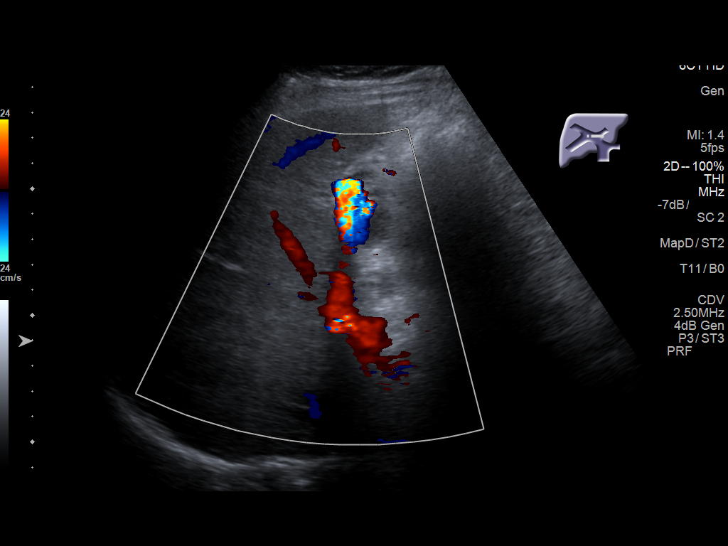
[im 45/78]
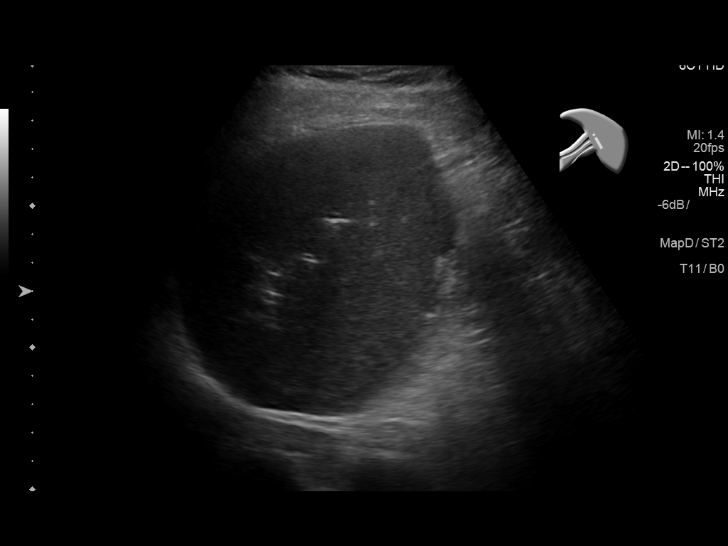
[im 52/78]
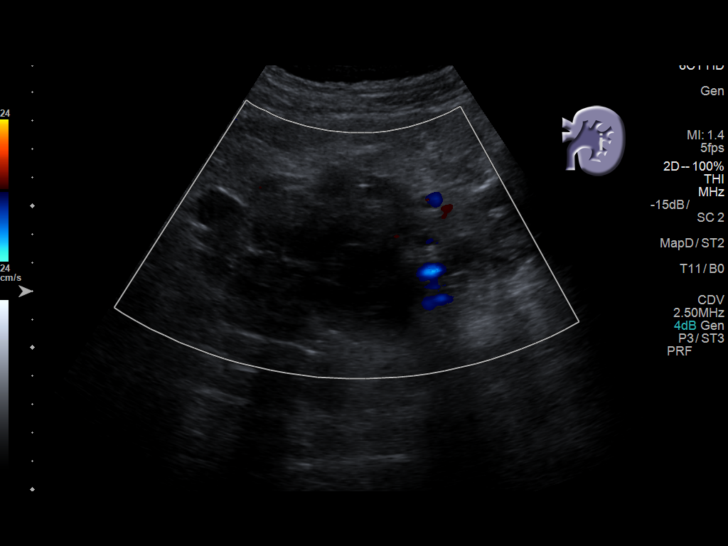
[im 58/78]
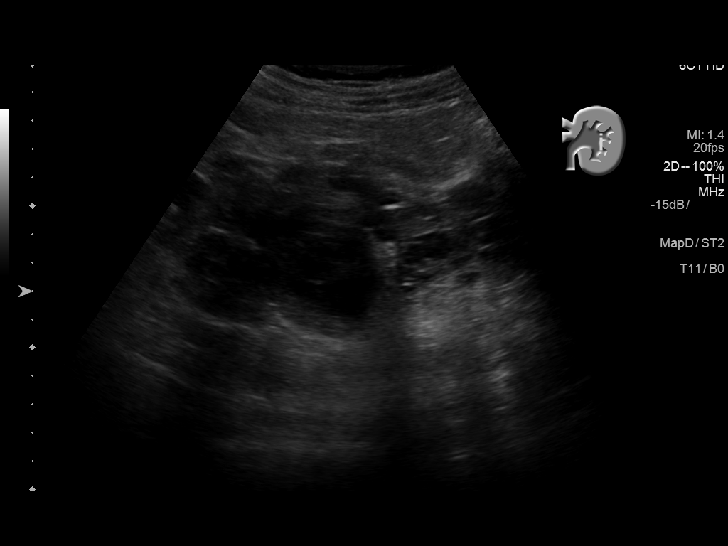
[im 65/78]
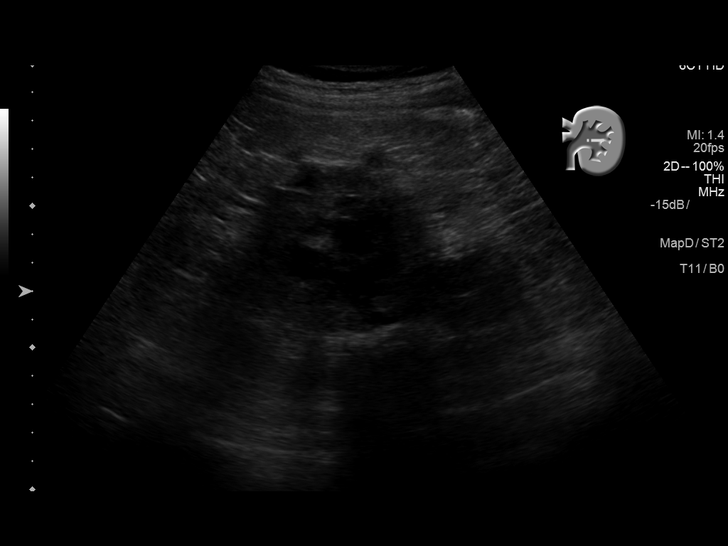
[im 71/78]
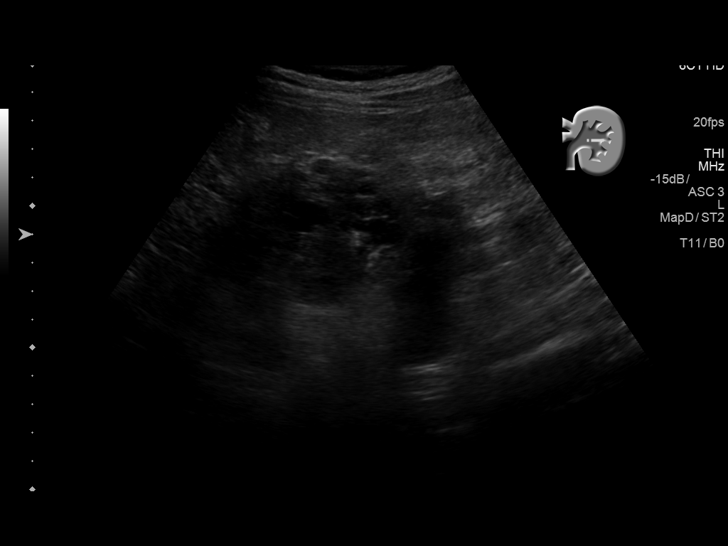
[im 78/78]
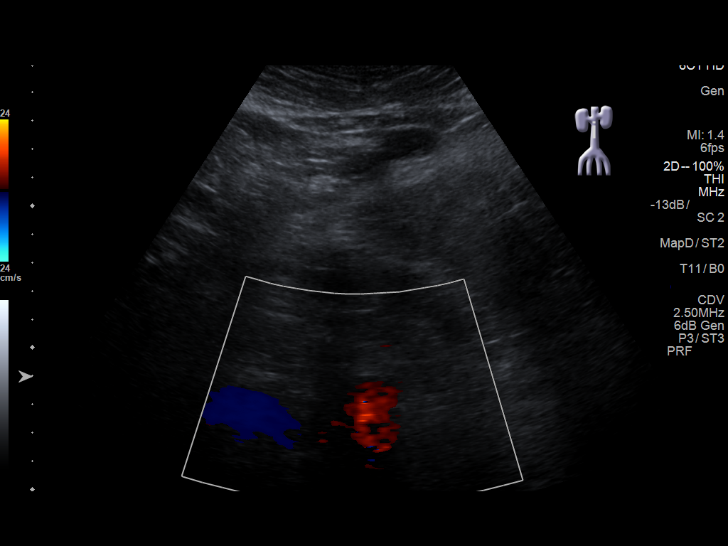

[13 of 25 positions shown; findings below may reference images not displayed]

FINDINGS: Gallbladder: Multiple echogenic gallstones, individually estimated
up to 13 millimeters diameter. The stones appear mo bile with
changes in patient positioning. However, there is mild gallbladder
wall thickening of 4 millimeters. No pericholecystic fluid. No
sonographic Murphy sign elicited.

Common bile duct: Diameter: 3 millimeters, normal

Liver: Stable simple appearing hepatic cysts (e.g. Image 22 in the
left lobe). Liver echogenicity otherwise within normal limits.
Portal vein is patent on color Doppler imaging with normal direction
of blood flow towards the liver.

IVC: No abnormality visualized.

Pancreas: Incompletely visualized due to overlying bowel gas,
visualized portions within normal limits.

Spleen: Stable, size and echogenicity within normal limits.

Right Kidney: Length: Surgically absent.

Left Kidney: Length: Estimated at 14.3 centimeters. Extensive cystic
renal disease, with very little normal renal parenchyma evident
(image 53).. No overtly solid/vascular left renal mass is evident.

Abdominal aorta: Calcified aortic atherosclerosis. As demonstrated
by CT. No abdominal aortic aneurysm identified.

Other findings: None.
IMPRESSION: 1. Positive for cholelithiasis with mild gallbladder wall
thickening, but no Sonographic Murphy Sign was elicited to strongly
indicate Acute Cholecystitis. If there is clinical suspicion of
Acute Cholecystitis, nuclear medicine hepatobiliary scan to evaluate
cystic duct patency may be valuable. There is no evidence of common
bile duct obstruction.
2. Left renal cystic disease of hemodialysis. Surgically absent
right kidney.
3. Benign hepatic cysts.

## 2020-01-26 IMAGING — CT CT ABD-PELV W/ CM
2 of 8 series · 13 of 46 positions shown, 15 images · IV contrast (Isovue)
Comparison: Hepatobiliary study 03/28/2017.

ADDENDUM:
Per discussion with Drey Jim, gastrointestinal physician's
assistant, patient is scheduled for left kidney MR 05/06/2017. At
such time, left kidney lesions, pancreatic tail lesion and fluid
collection along the inferior aspect of the liver can be assessed.

The pancreatic tail collection may represent pancreatitis although
primary pancreatic mass with secondary inflammation cannot be
excluded. Patient is scheduled for EUS.
CLINICAL DATA: 61-year-old hypertensive male with generalized
abdominal pain. End-stage renal disease on dialysis. Post right
nephrectomy for renal cancer 1995. Post left renal lesion at
ablation 6115. Subsequent encounter.
EXAM:
CT ABDOMEN AND PELVIS WITH CONTRAST
TECHNIQUE: Multidetector CT imaging of the abdomen and pelvis was performed
using the standard protocol following bolus administration of
intravenous contrast.
CONTRAST:  100mL CTQJPK-JVV IOPAMIDOL (CTQJPK-JVV) INJECTION 61%

[Series 2: axial st · axial · 0.84mm/px · z∈[+1075,+1550]mm · 10 of 109 slices shown, 12 images]
[im 7/109  soft-tissue]
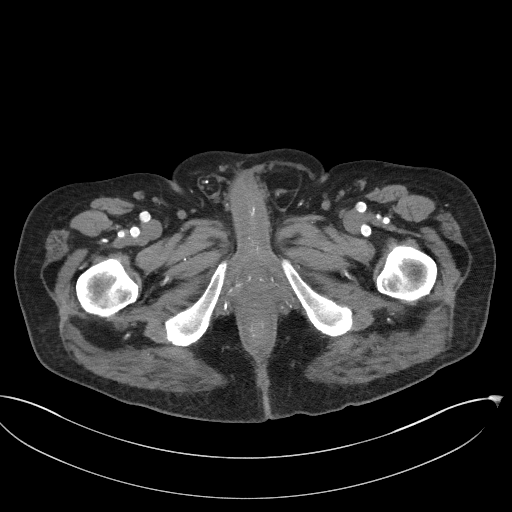
[im 7/109  bone]
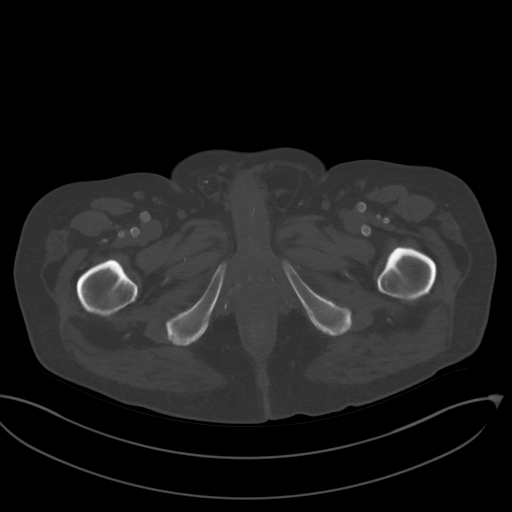
[im 20/109  soft-tissue]
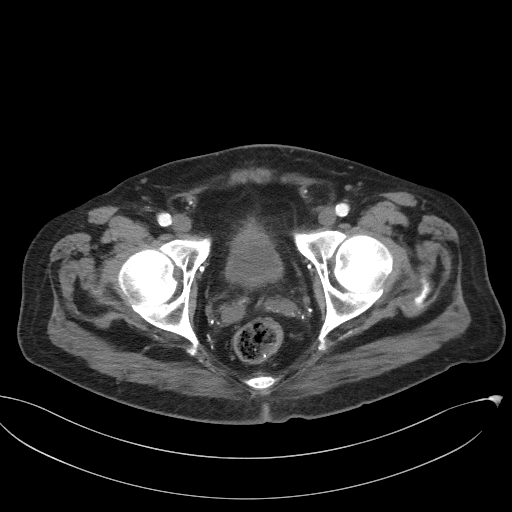
[im 32/109  soft-tissue]
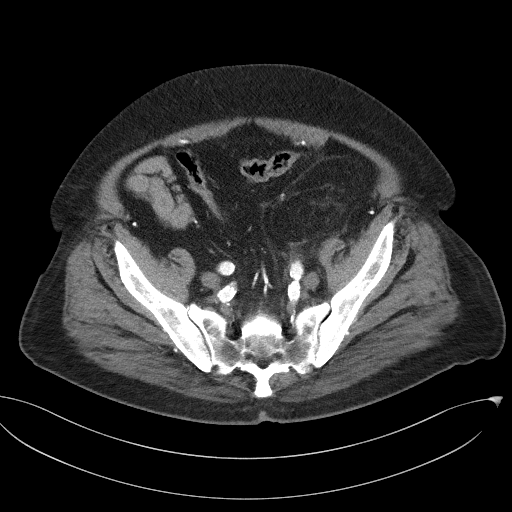
[im 39/109  soft-tissue]
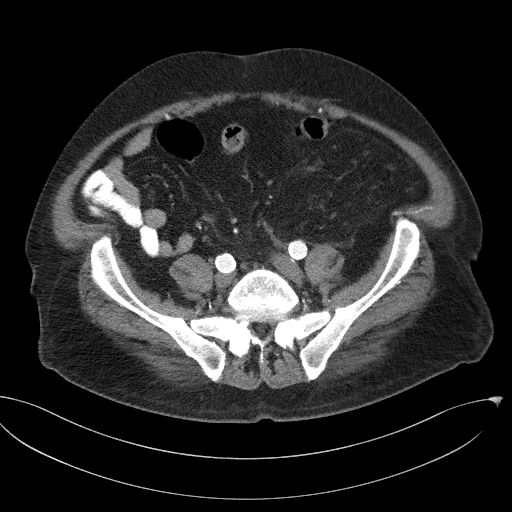
[im 51/109  soft-tissue]
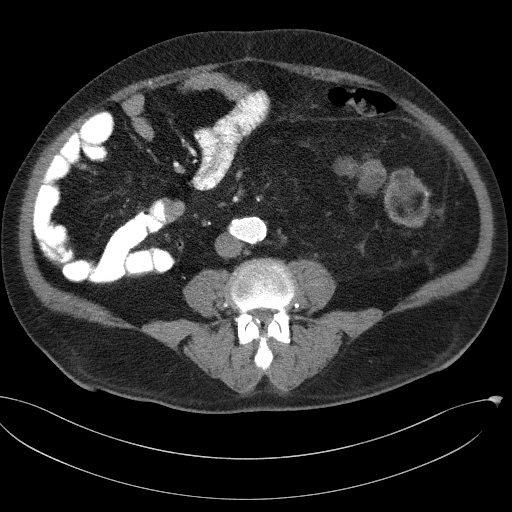
[im 58/109  soft-tissue]
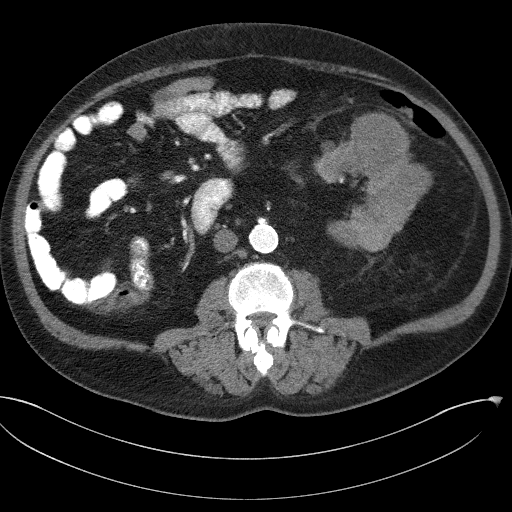
[im 70/109  soft-tissue]
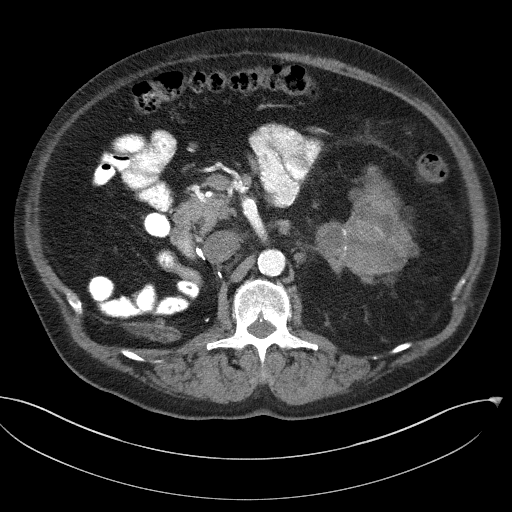
[im 83/109  soft-tissue]
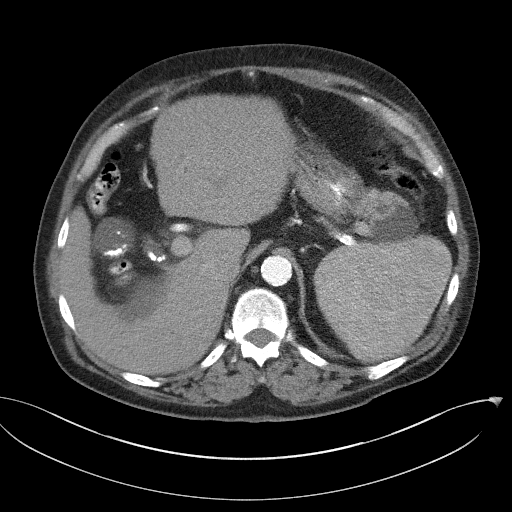
[im 89/109  soft-tissue]
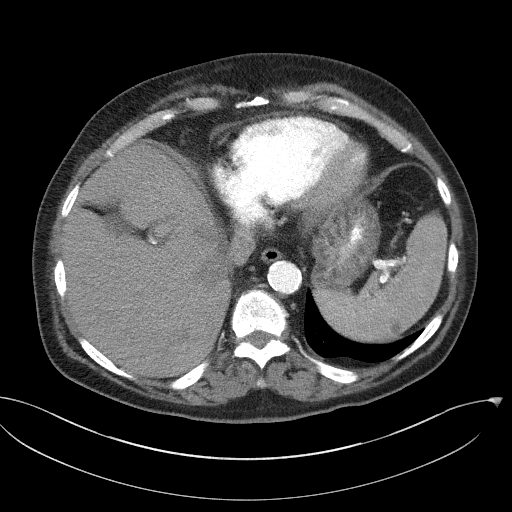
[im 89/109  bone]
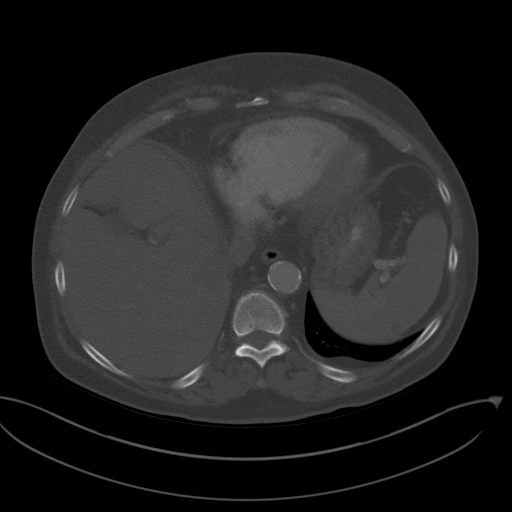
[im 102/109  soft-tissue]
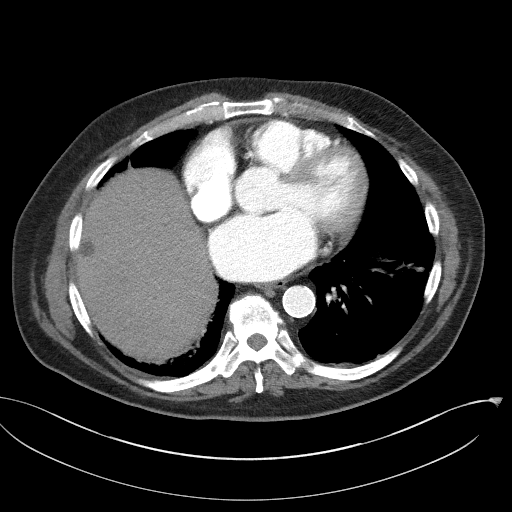

[Series 5: coronal st · coronal · 0.86mm/px · 3 of 117 slices shown]
[im 24/117  soft-tissue]
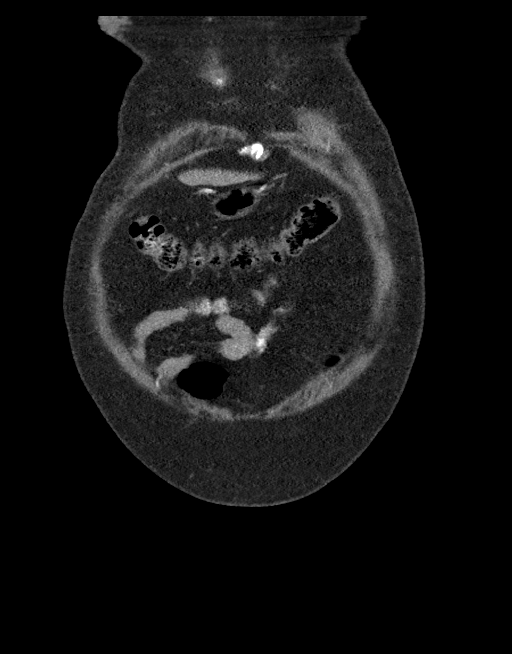
[im 47/117  soft-tissue]
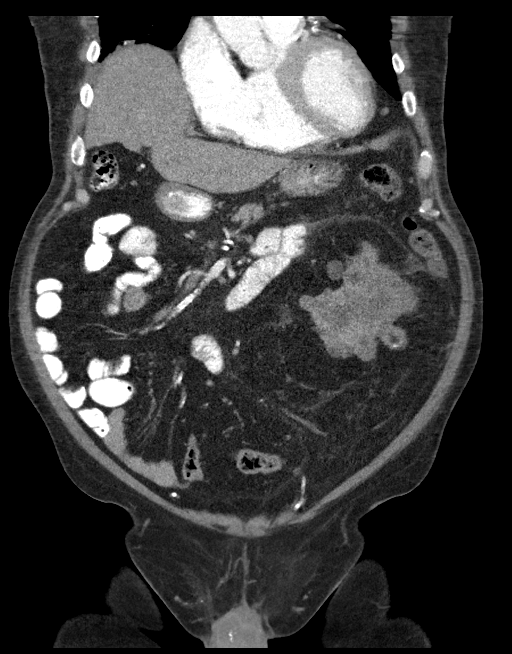
[im 70/117  soft-tissue]
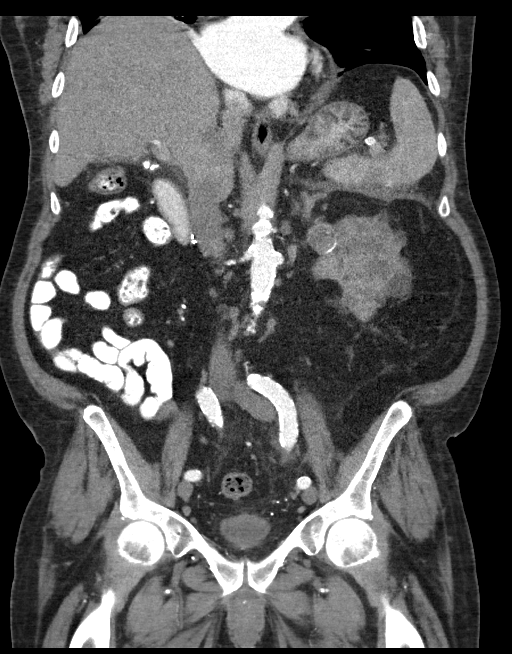

[13 of 46 positions shown; findings below may reference images not displayed]

Abdominal sonogram
03/14/2017. MR abdomen 01/05/2017. CT abdomen pelvis 05/07/2016.
FINDINGS: Lower chest: Gynecomastia. Scarring/atelectasis lung bases. Mild
varicoid bronchiectasis right lower lobe. Mild cardiomegaly.
Prominent 3 vessel coronary artery calcification. Calcification
aortic valve. Ascending thoracic aorta measures 3.7 cm.

Hepatobiliary: Scattered hepatic low-density lesions probably cysts
similar to prior exam. Loculated fluid collection along the inferior
aspect of the liver slightly larger than on the prior exam now
measuring 7.1 x 3.5 x 7.8 cm versus prior 6 x 2.5 x 6.4 cm. Etiology
indeterminate.

Multiple calcified gallstones. Tiny amount of pericholecystic
fluid/minimal gallbladder wall thickening may be present.

Pancreas: New pancreatic tail 5.1 x 4.1 x 3.9 cm fluid appearing
collection. Adjacent free fluid extends along the anterior superior
margin of Gerota's fascia. No obstructing mass seen as cause of this
finding and may reflect pancreatitis. No thrombosis of the adjacent
splenic vein.

Spleen: Lobular contour superior spleen with 1.1 cm low-density
structure superior aspect new from the prior examination. Inferior
to this level, 9 mm low-density structure may been present
previously.

Adrenals/Urinary Tract: Post right nephrectomy. Surgical bed/clips
appear similar to prior exam.

Multiple complex left renal lesions once again noted. A few of these
appear minimally more complex than on prior examination
(particularly lower pole and upper pole complex cysts) and therefore
tumor not excluded. Upper pole and left lower pole complex lesion
with peripheral fat, appearance suggesting result of prior ablation.

No worrisome adrenal lesion.

Decompressed urinary bladder incompletely assessed.

Stomach/Bowel: No primary bowel inflammatory process is noted.
Majority of small bowel within the right aspect of the abdomen
although ligament of Treitz just to the left of midline.
Decompressed stomach without abnormality noted.

Vascular/Lymphatic: Atherosclerotic changes aorta with mild ectasia
without focal aneurysm. Atherosclerotic changes aortic branch
vessels with narrowing but without large vessel occlusion.
Atherosclerotic changes iliac arteries with mild ectasia without
aneurysm.

Scattered normal size lymph nodes largest peripancreatic region.
Additionally, slightly rounded lymph nodes left periaortic region at
the level of the left kidney unchanged from prior exam.

Reproductive: Slightly prominent size prostate gland.

Other: No free air or bowel containing hernia. Inguinal fat and
vessel containing hernias.

Musculoskeletal: Lucencies lower thoracic spine suggestive of
Schmorl's node deformities similar prior exam. No osseous
destructive lesion.
IMPRESSION: New pancreatic tail 5.1 x 4.1 x 3.9 cm fluid appearing collection.
Adjacent free fluid extends along the anterior superior margin of
Gerota's fascia. No obstructing mass seen as cause of this finding
and may reflect pancreatitis.

Multiple calcified gallstones. Tiny amount of pericholecystic
fluid/minimal gallbladder wall thickening may be present.

Loculated fluid collection along the inferior aspect of the liver
slightly larger than on the prior exam now measuring 7.1 x 3.5 x
cm versus prior 6 x 2.5 x 6.4 cm. Etiology indeterminate.

Post right nephrectomy. Surgical bed/clips appear similar to prior
exam.

Multiple complex left renal lesions once again noted. A few of these
appear minimally more complex than on prior examination
(particularly lower pole and upper pole complex cysts) and therefore
tumor not excluded. Upper pole and left lower pole complex lesion
with peripheral fat, appearance suggesting result of prior ablation.

Slightly rounded lymph nodes adjacent left kidney stable.

1.1 cm low-density structure superior aspect of the spleen new from
the prior examination. Etiology indeterminate.

Slightly prominent prostate gland.

Aortic Atherosclerosis (GI1Y4-7IP.P).

Cardiomegaly.  Coronary artery calcification.

Basilar atelectasis.
